# Patient Record
Sex: Female | Born: 1935 | Race: White | Hispanic: No | Smoking: Former smoker
Health system: Southern US, Community
[De-identification: ages and names within clinical notes are randomized; demographics above are authoritative.]

## PROBLEM LIST (undated history)

## (undated) DIAGNOSIS — K259 Gastric ulcer, unspecified as acute or chronic, without hemorrhage or perforation: Secondary | ICD-10-CM

## (undated) DIAGNOSIS — K429 Umbilical hernia without obstruction or gangrene: Secondary | ICD-10-CM

## (undated) DIAGNOSIS — K648 Other hemorrhoids: Secondary | ICD-10-CM

## (undated) DIAGNOSIS — M419 Scoliosis, unspecified: Secondary | ICD-10-CM

## (undated) DIAGNOSIS — G459 Transient cerebral ischemic attack, unspecified: Secondary | ICD-10-CM

## (undated) DIAGNOSIS — I441 Atrioventricular block, second degree: Secondary | ICD-10-CM

## (undated) DIAGNOSIS — C801 Malignant (primary) neoplasm, unspecified: Secondary | ICD-10-CM

## (undated) DIAGNOSIS — E7800 Pure hypercholesterolemia, unspecified: Secondary | ICD-10-CM

## (undated) DIAGNOSIS — R32 Unspecified urinary incontinence: Secondary | ICD-10-CM

## (undated) DIAGNOSIS — I340 Nonrheumatic mitral (valve) insufficiency: Secondary | ICD-10-CM

## (undated) DIAGNOSIS — F411 Generalized anxiety disorder: Secondary | ICD-10-CM

## (undated) DIAGNOSIS — S86812A Strain of other muscle(s) and tendon(s) at lower leg level, left leg, initial encounter: Secondary | ICD-10-CM

## (undated) DIAGNOSIS — K222 Esophageal obstruction: Secondary | ICD-10-CM

## (undated) DIAGNOSIS — I5189 Other ill-defined heart diseases: Secondary | ICD-10-CM

## (undated) DIAGNOSIS — K4090 Unilateral inguinal hernia, without obstruction or gangrene, not specified as recurrent: Secondary | ICD-10-CM

## (undated) DIAGNOSIS — K449 Diaphragmatic hernia without obstruction or gangrene: Secondary | ICD-10-CM

## (undated) DIAGNOSIS — M1990 Unspecified osteoarthritis, unspecified site: Secondary | ICD-10-CM

## (undated) DIAGNOSIS — I10 Essential (primary) hypertension: Secondary | ICD-10-CM

## (undated) DIAGNOSIS — D509 Iron deficiency anemia, unspecified: Secondary | ICD-10-CM

## (undated) DIAGNOSIS — I440 Atrioventricular block, first degree: Secondary | ICD-10-CM

## (undated) DIAGNOSIS — Z86718 Personal history of other venous thrombosis and embolism: Secondary | ICD-10-CM

## (undated) DIAGNOSIS — K5790 Diverticulosis of intestine, part unspecified, without perforation or abscess without bleeding: Secondary | ICD-10-CM

## (undated) DIAGNOSIS — I471 Supraventricular tachycardia, unspecified: Secondary | ICD-10-CM

## (undated) DIAGNOSIS — I7090 Unspecified atherosclerosis: Secondary | ICD-10-CM

## (undated) DIAGNOSIS — I2510 Atherosclerotic heart disease of native coronary artery without angina pectoris: Secondary | ICD-10-CM

## (undated) DIAGNOSIS — E039 Hypothyroidism, unspecified: Secondary | ICD-10-CM

## (undated) DIAGNOSIS — E559 Vitamin D deficiency, unspecified: Secondary | ICD-10-CM

## (undated) HISTORY — DX: Pure hypercholesterolemia, unspecified: E78.00

## (undated) HISTORY — DX: Esophageal obstruction: K22.2

## (undated) HISTORY — DX: Nonrheumatic mitral (valve) insufficiency: I34.0

## (undated) HISTORY — DX: Vitamin D deficiency, unspecified: E55.9

## (undated) HISTORY — DX: Umbilical hernia without obstruction or gangrene: K42.9

## (undated) HISTORY — DX: Other hemorrhoids: K64.8

## (undated) HISTORY — DX: Unspecified osteoarthritis, unspecified site: M19.90

## (undated) HISTORY — DX: Iron deficiency anemia, unspecified: D50.9

## (undated) HISTORY — DX: Unilateral inguinal hernia, without obstruction or gangrene, not specified as recurrent: K40.90

## (undated) HISTORY — DX: Gastric ulcer, unspecified as acute or chronic, without hemorrhage or perforation: K25.9

## (undated) HISTORY — DX: Hypothyroidism, unspecified: E03.9

## (undated) HISTORY — DX: Unspecified urinary incontinence: R32

## (undated) HISTORY — DX: Personal history of other venous thrombosis and embolism: Z86.718

## (undated) HISTORY — DX: Atherosclerotic heart disease of native coronary artery without angina pectoris: I25.10

## (undated) HISTORY — DX: Generalized anxiety disorder: F41.1

## (undated) HISTORY — DX: Scoliosis, unspecified: M41.9

## (undated) HISTORY — DX: Unspecified atherosclerosis: I70.90

## (undated) HISTORY — PX: TONSILLECTOMY: SUR1361

## (undated) HISTORY — DX: Diverticulosis of intestine, part unspecified, without perforation or abscess without bleeding: K57.90

## (undated) HISTORY — PX: LAPAROSCOPIC HYSTERECTOMY: SHX1926

## (undated) HISTORY — DX: Essential (primary) hypertension: I10

## (undated) HISTORY — DX: Diaphragmatic hernia without obstruction or gangrene: K44.9

---

## 2001-07-10 HISTORY — PX: COLONOSCOPY: SHX174

## 2006-07-10 HISTORY — PX: ABDOMINAL HYSTERECTOMY: SHX81

## 2006-10-08 ENCOUNTER — Encounter (INDEPENDENT_AMBULATORY_CARE_PROVIDER_SITE_OTHER): Payer: Self-pay | Admitting: *Deleted

## 2006-10-08 ENCOUNTER — Inpatient Hospital Stay (HOSPITAL_COMMUNITY): Admission: RE | Admit: 2006-10-08 | Discharge: 2006-10-10 | Payer: Self-pay | Admitting: Obstetrics & Gynecology

## 2009-04-09 ENCOUNTER — Ambulatory Visit (HOSPITAL_COMMUNITY): Admission: RE | Admit: 2009-04-09 | Discharge: 2009-04-09 | Payer: Self-pay | Admitting: Internal Medicine

## 2009-07-10 HISTORY — PX: CARDIAC CATHETERIZATION: SHX172

## 2010-03-22 ENCOUNTER — Encounter (INDEPENDENT_AMBULATORY_CARE_PROVIDER_SITE_OTHER): Payer: Self-pay | Admitting: *Deleted

## 2010-05-04 ENCOUNTER — Ambulatory Visit: Payer: Self-pay | Admitting: Cardiovascular Disease

## 2010-08-09 NOTE — Letter (Signed)
Summary: Colonoscopy Date Change Letter  [facility]  [address] [place], [place] [address]   Phone: [phone]  Fax: [phone]      March 22, 2010 MRN: [id number]   Kathleen Paul [address] [place], [place]  [address]   Dear Ms. [facility],   Previously you were recommended to have a repeat colonoscopy around this time. Your chart was recently reviewed by Dr. Russella Dar of [facility]. Follow up colonoscopy is now recommended in November 2014. This revised recommendation is based on current, nationally recognized guidelines for colorectal cancer screening and polyp surveillance. These guidelines are endorsed by the American Cancer Society, [employer] on Colorectal Cancer as well as numerous other major medical organizations.  Please understand that our recommendation assumes that you do not have any new symptoms such as bleeding, a change in bowel habits, anemia, or significant abdominal discomfort. If you do have any concerning GI symptoms or want to discuss the guideline recommendations, please call to arrange an office visit at your earliest convenience. Otherwise we will keep you in our reminder system and contact you 1-2 months prior to the date listed above to schedule your next colonoscopy.  Thank you,   Judie Petit T. Russella Dar, M.D.  [facility] [phone]

## 2010-11-25 NOTE — H&P (Signed)
NAME:  Kathleen Paul, Kathleen Paul NO.:  [id number]   MEDICAL RECORD NO.:  [id number]          PATIENT TYPE:  AMB   LOCATION:  SDC                           [facility]ILITY:  [facility]   PHYSICIAN:  Freddy Finner, M.D.   DATE OF BIRTH:  05-27-1936   DATE OF ADMISSION:  DATE OF DISCHARGE:                              HISTORY & PHYSICAL   ADMISSION DIAGNOSES:  1. Uterine prolapse.  2. Cystocele/rectocele.  3. Stress urinary incontinence.   The patient is a 75 year old white widowed female gravida 5, para 4, who  initially presented to the office in January of 2008 for a GYN exam with  Pap and for a complaint of her bladder falling down. Physical findings  are essentially in the admitting diagnoses. She also had cystometric's  performed in the office on August 29, 2006 with a pessary in place.  That study did reveal stress urinary incontinence and no urethral  pressure, but no other abnormalities.  On her examination the cystocele  prolapse is through the vaginal introitus as does the cervix and there  is a rectocele. Options of therapy have been discussed with the patient  and she was tried on a vaginal pessary which was unsatisfactory for her  and she has requested surgical intervention.  She is admitted at this  time for laparoscopically assisted vaginal hysterectomy, anterior and  posterior colporrhaphy, sacrospinous  ligament suspension of the vagina.   CURRENT REVIEW OF SYSTEMS:  Otherwise is negative. She has no  cardiopulmonary, GI or GU complaints.   PAST MEDICAL HISTORY:  1. The patient is known to have hyperthyroidism.  2. Hypercholesterolemia.  3. Hypertension.  4. Mild osteopenia.   CURRENT MEDICATIONS:  1. Fosamax 70 mg a week.  2. Enalapril 10 mg daily.  3. Levoxyl 0.1 mg daily.  4. Vytorin 10/20 mg daily.  5. Triamterene/hydrochlorothiazide 37.5/25.  6. Calcium 1200 mg daily.   No other medications on a chronic basis except for currently using  estrogen  vaginal cream preoperatively on a nightly basis.   ALLERGIES:  CODEINE.   PAST SURGICAL HISTORY:  She has had four vaginal deliveries. She has a  tubal ligation. She has had vein stripping for venous varicosities of  the lower extremities. She does have a history of having had a  superficial venous phlebitis of the lower extremities.   She had a normal mammogram in June of 2007. She had a scan for partial  bone density that showed a -1 of the right hip.   PHYSICAL EXAMINATION:  HEENT:  Grossly within normal limits.  VITAL SIGNS:  Her most recent blood pressure in the office was 120/80.  NECK: Thyroid gland is not palpably enlarged.  CHEST:  Clear to auscultation throughout.  HEART:  Normal sinus rhythm without murmurs, rubs, or gallops.  ABDOMEN:  Soft and nontender without appreciable organomegaly or  palpable masses.  BREAST EXAM: Normal. No palpable masses, no nipple discharge or skin  change.  PELVIC FINDINGS:  Are discussed in the admitting diagnoses.  BIMANUAL PELVIC EXAM:  Reveals no palpable enlargement of the uterus or  adnexal  masses.  RECTAL:  Normal rectum. Rectovaginal exam confirms the above findings.   Pelvic ultrasound is also considered to be normal with no visible  adnexal masses.   ASSESSMENT:  Symptomatic pelvic relaxation with cervical prolapse,  cystocele prolapse, second degree rectocele.   PLAN:  Laparoscopic assisted vaginal hysterectomy, bilateral salpingo-  oophorectomy, anterior and posterior colporrhaphy. Sacrospinous ligament  suspension of vagina.   Careful consultation with the patient has been carried out, potential  risks to other organs during the procedure, and risk of injury to  bladder or rectum. She will receive antibiotic prophylaxis, sequential  compression hose for antiembolic prophylaxis as well as may need  subcutaneous heparin.      Freddy Finner, M.D.  Electronically Signed     WRN/MEDQ  D:  10/07/2006  T:  10/07/2006   Job:  [id number]

## 2010-11-25 NOTE — Discharge Summary (Signed)
NAME:  Kathleen Paul, Kathleen Paul NO.:  [id number]   MEDICAL RECORD NO.:  [id number]          PATIENT TYPE:  INP   LOCATION:  9319                          [facility]ILITY:  [facility]   PHYSICIAN:  Freddy Finner, M.D.   DATE OF BIRTH:  [birth year]   DATE OF ADMISSION:  10/08/2006  DATE OF DISCHARGE:  10/10/2006                               DISCHARGE SUMMARY   DISCHARGE DIAGNOSES:  1. Uterine prolapse.  2. Third-degree cystocele, second-degree rectocele, vaginal vault      prolapse.  3. 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  4. Benign ovarian inclusion cyst.   OPERATIVE PROCEDURES:  1. Laparoscopically-assisted vaginal hysterectomy.  2. Bilateral salpingo-oophorectomy.  3. Anterior/posterior vaginal repair with sacrospinous ligament      suspension.  4. Suprapubic cystotomy with banana catheter.   INTRAOPERATIVE AND POSTOPERATIVE COMPLICATIONS:  None.   DISPOSITION:  The patient was in satisfactory improved condition on the  second postoperative day by mid day.  At that time, she was voiding  adequate amounts with residuals of 50 mL and the suprapubic catheter was  removed.  She was discharged home on progressively increasing physical  activity, a regular diet.  She was given Percocet 5/325 to be taken as  needed for postoperative pain.  She was given Ceftin 250 mg b.i.d. for 7  days.  There was mild erythema and edema at the suprapubic cath site and  she was experiencing some mild dysuria.   She is to return to the office in approximately 2 weeks.   She is to resume all of her preadmission medications.   She is to take a regular diet.   She is to avoid vaginal entry and to call for fever, heavy bleeding, or  urinary retention.   Details of present illness, past history, family history, review of  systems, and physical exam are recorded in the admission.   Physical findings on admission were primarily remarkable for the pelvic  findings noted as a  postoperative diagnosis.   ADMISSION LABORATORY DATA:  Including CBC, urinalysis, prothrombin time,  and PTT were all normal.  Postoperative hemoglobin was 11.1.   HOSPITAL COURSE:  Following admission, the patient was given 5,000 units  of heparin subcutaneously.  She was placed in compression antiembolic  hose.  She was given an IV bolus of Ancef.  She was taken to the  operating room.  The above-described procedure was accomplished without  intraoperative complications.  Her subcutaneous heparin was continued  every 8 hours for a total of three doses postoperatively.  This was  given for a history of superficial phlebitis in the past and previous  vein-stripping.  Her postoperative course was entirely satisfactory and with no  complications.  By the second postoperative day, her bladder had  returned to adequate function with voiding amounts of at least 300 mL  and residuals of 50 or less.  She was tolerating a regular diet.  She  was ambulating without assistance.  She was having adequate bowel  function.  She had remained afebrile throughout the hospital stay.  She  was discharged home  with disposition as noted above.      Freddy Finner, M.D.  Electronically Signed     WRN/MEDQ  D:  10/11/2006  T:  10/11/2006  Job:  [id number]

## 2010-11-25 NOTE — Op Note (Signed)
NAME:  Kathleen Paul, Kathleen Paul NO.:  [id number]   MEDICAL RECORD NO.:  [id number]          PATIENT TYPE:  INP   LOCATION:  9319                          [facility]   PHYSICIAN:  Freddy Finner, M.D.   DATE OF BIRTH:  July 23, 1935   DATE OF PROCEDURE:  10/08/2006  DATE OF DISCHARGE:                               OPERATIVE REPORT   PREOPERATIVE DIAGNOSES:  1. Third-degree cystocele.  2. Second-degree rectocele.  3. Cervical prolapse.  4. Vaginal vault prolapse or descensus.   OPERATIVE PROCEDURE:  1. Laparoscopic-assisted vaginal hysterectomy.  2. Bilateral salpingo-oophorectomy.  3. Anterior and posterior colporrhaphy.  4. Sacrospinous ligament suspension of the vagina.  5. Suprapubic cystotomy.   ESTIMATED INTRAOPERATIVE BLOOD LOSS:  250 mL.   SURGEON:  Freddy Finner, M.D.   ASSISTANT:  Duke Salvia. Marcelle Overlie, M.D.   INTRAOPERATIVE COMPLICATIONS:  None.   Details of the present illness are recorded in the admission note.  Physical findings are as in the Preoperative Diagnoses.  The patient has  a history of superficial phlebitis and venous varicosities with surgical  stripping, and for that reason was given perioperative heparin 5000  units subcutaneously which will be continued in the postoperative  period.  She was given an IV bolus of Ancef preoperatively.  She was  placed in antiembolic compression hose.   DESCRIPTION OF PROCEDURE:  She was brought to the operating room, placed  under adequate general endotracheal anesthesia, placed in dorsolithotomy  position.  Allen stirrups were used.  Betadine prep was carried out  using Betadine scrub followed by Betadine solution. The bladder was  evacuated with a sterile catheter.  Hulka tenaculum was attached to the  cervix.  Sterile drapes were applied.  Two small incisions were made,  one at the umbilicus, one just above the symphysis.  Through the upper  incision, an 11 mm nonbladed disposable trocar was  introduced.  A 5-6 mm  incision was made just above the symphysis and through it.  A second 5  mm trocar was placed through it.  Ring grasping forceps and Nezhat  irrigation system were used for traction, irrigation, and exposure.  Examination of the upper abdomen revealed no apparent abnormalities.  Also, there were no apparent abnormalities in the pelvis.   The tubes and ovaries were atrophic but appropriate for age.  The uterus  itself was essentially normal.  Using the tripolar device through the  operating channel of the laparoscope, the infundibulopelvic ligaments,  round ligaments, upper broad ligaments were sealed and divided.  This  was performed essentially identically on each side.  Dissection was  carried down to the level just above the arteries.  Attention was then  turned vaginally.   Posterior weighted retractor was placed.  Hulka tenaculum was replaced  with a Jacobs tenaculum.  Deaver retractors were used anterolaterally  for exposure.  Colpotomy incision was made while tenting the mucosa  posterior to the cervix.  The cervix was circumscribed on scalpel to  release the mucosa.  The uterosacral pedicles were taken, sealed, and  divided using the ligature system as  were the bladder pillars.  The  bladder was further advanced off the cervix.  Cardinal ligament pedicles  were taken, sealed, and divided using LigaSure.  Anterior peritoneum was  entered.  Vessel pedicles were taken, sealed, and divided with LigaSure.  This allowed delivery of the uterus through the vaginal introitus.  The  remaining small peritoneal pedicle was sealed and divided using  LigaSure.  Angles of the vagina were then anchored to the uterosacrals  with mattress sutures of 0 Monocryl.  Posterior peritoneum was closed  along with uterosacrals with interrupted 0 Monocryl suture.  The  posterior half of the vaginal incision was closed with interrupted  figure-of-eights of 0 Monocryl.  Edges of the  anterior mucosa were  grasped with Allis'.  The mucosa was tented in the midline.  Progressive  sharp and blunt dissection was used to free the mucosa from the  vesicovaginal fascia.  Interrupted 0 Monocryl sutures were used to  reapproximate the vesicovaginal fascia and to elevate the cystocele and  support the urethral angle.  The urethra was checked for length and  patency using a sterile Kelly.  Segments of mucosa were excised.  The  remaining cuff was closed with figure-of-eights of 0 Monocryl.  The  incision in the midline anteriorly was closed with a running locking 2-0  Monocryl suture.  Attention was turned posteriorly.  The vaginal mucosa  was grasped with the fourchette.  A pyramidal shaped segment of skin was  excised from the perineal body with apex inferiorly.  The mucosa  overlying the rectum was elevated, sharply and bluntly dissected free  and a midline incision made for a distance of approximately 8 cm.  This  was carefully separated from the perirectal structures and tissue.  Using the Capio device with a 0 Dexon suture, the suture was anchored in  the right uterosacral ligament and attached to the vaginal mucosa  posteriorly just below the apex of the vagina.  Plication sutures were  used to recreate the rectovaginal septum and to reapproximate the  levators and the peritoneal body.  The mucosal closure was then started  using a 2-0 Monocryl suture running for a distance of approximately 4-5  cm at which point the sacrospinous ligament suture was tied.  The repair  was completed in a fashion similar to episiotomy repair with running 2-0  Monocryl.  The bladder was filled 300 mL of irrigating solution.  The  suprapubic catheter was placed.  A Bonanno catheter was used, it was  anchored to the skin with 0 nylon sutures.  The bladder was evacuated.  Reinspection was then carried out laparoscopically using the Nezhat irrigation system.  The irrigating solution was aspirated  from the  pelvis.  Inspection of the pedicles revealed complete hemostasis.  Inspection under reduced intra-abdominal pressure confirmed hemostasis.  Gas was allowed to escape from the abdomen.  Irrigation was aspirated  from the pelvis.  Instruments were removed.  Skin incisions were closed  with interrupted subcuticular sutures of 3-0 Dexon.  0.5% Marcaine was  injected into the incision sites for postoperative analgesia.  Steri-  Strips were applied to the lower incision.  The patient was awakened and  taken to the recovery room in good condition.      Freddy Finner, M.D.  Electronically Signed     WRN/MEDQ  D:  10/08/2006  T:  10/09/2006  Job:  [id number]

## 2011-03-24 ENCOUNTER — Encounter: Payer: Self-pay | Admitting: Internal Medicine

## 2011-03-27 ENCOUNTER — Encounter: Payer: Self-pay | Admitting: Internal Medicine

## 2011-03-27 ENCOUNTER — Ambulatory Visit (INDEPENDENT_AMBULATORY_CARE_PROVIDER_SITE_OTHER): Payer: Medicare Other | Admitting: Internal Medicine

## 2011-03-27 DIAGNOSIS — R002 Palpitations: Secondary | ICD-10-CM

## 2011-03-27 DIAGNOSIS — I2510 Atherosclerotic heart disease of native coronary artery without angina pectoris: Secondary | ICD-10-CM

## 2011-03-27 DIAGNOSIS — E785 Hyperlipidemia, unspecified: Secondary | ICD-10-CM

## 2011-03-27 DIAGNOSIS — I10 Essential (primary) hypertension: Secondary | ICD-10-CM

## 2011-03-27 DIAGNOSIS — R079 Chest pain, unspecified: Secondary | ICD-10-CM

## 2011-03-27 MED ORDER — ENALAPRIL-HYDROCHLOROTHIAZIDE 10-25 MG PO TABS: ORAL_TABLET | ORAL | Status: DC

## 2011-03-27 NOTE — Patient Instructions (Addendum)
Your physician recommends that you schedule a follow-up appointment in: AS NEEDED  Your physician has recommended you make the following change in your medication: DECREASE  ENALAPRIL HCT  TO 1/2 TAB EVERY DAY   LOPRESSOR 25 MG 1/2 TAB TWICE DAILY  Your physician has requested that you have en exercise stress myoview. For further information please visit [email or web address] Please follow instruction sheet, as given. DX CHEST PAIN

## 2011-03-29 DIAGNOSIS — E785 Hyperlipidemia, unspecified: Secondary | ICD-10-CM | POA: Insufficient documentation

## 2011-03-29 DIAGNOSIS — I10 Essential (primary) hypertension: Secondary | ICD-10-CM

## 2011-03-29 DIAGNOSIS — I2510 Atherosclerotic heart disease of native coronary artery without angina pectoris: Secondary | ICD-10-CM | POA: Insufficient documentation

## 2011-03-29 DIAGNOSIS — R002 Palpitations: Secondary | ICD-10-CM | POA: Insufficient documentation

## 2011-03-29 DIAGNOSIS — R079 Chest pain, unspecified: Secondary | ICD-10-CM | POA: Insufficient documentation

## 2011-03-29 HISTORY — DX: Essential (primary) hypertension: I10

## 2011-03-29 NOTE — Assessment & Plan Note (Addendum)
She is also on Niaspan.   1 gram.  Keep on statin.  WIll need to be followed.

## 2011-03-29 NOTE — Assessment & Plan Note (Signed)
Patient with some symptoms that may reflect angina  However also has distinct symptoms that are more GI in nature.  She does have an appt in GI. I would recomm a stress myoview to evaluate for ischemia. Get records from [facility].

## 2011-03-29 NOTE — Assessment & Plan Note (Signed)
Get records from [facility].

## 2011-03-29 NOTE — Assessment & Plan Note (Signed)
BP is lower at home.  I would cut vasoretic in 1/2.  Add metoprolol to regimen for palpitations.

## 2011-03-29 NOTE — Progress Notes (Signed)
HPI  Patient is a 75 year old who was previously followed by Dr. Welton Flakes in [place].  Reported to have cath several years ago that showed a "50% narrowing" She wants a new heart doctor.  Referred by Dr. Toni Arthurs. DOes complain of chest tightness with exertion.  INtermittent.  Does say that symptoms are relieved with walking..  Some SOB.  Has a irregular heart beat but when on metoprolol in past did not tolerate because of dizziness. Also notes some dysphagial.  This can cause chest pain. These symptoms brought on by food.   Frequency is unchanged.  Does have referral to GI. Note that her BP is lower at home than in the doctors office.  Usually runs 95/ to 110. Allergies  Allergen Reactions  . Codeine     Current Outpatient Prescriptions  Medication Sig Dispense Refill  . aspirin 81 MG tablet Take 81 mg by mouth daily.        . calcium-vitamin D (CALCIUM + D) 250-125 MG-UNIT per tablet Take 1 tablet by mouth daily.        . diclofenac (VOLTAREN) 75 MG EC tablet Take 75 mg by mouth 2 (two) times daily.        . enalapril-hydrochlorothiazide (VASERETIC) 10-25 MG per tablet 1/2 TAB EVERY DAY        . levothyroxine (SYNTHROID, LEVOTHROID) 75 MCG tablet Take 75 mcg by mouth daily.        . simvastatin (ZOCOR) 40 MG tablet Take 40 mg by mouth at bedtime.          Past Medical History  Diagnosis Date  . Chest pain   . Essential hypertension, benign   . Dysphagia, unspecified   . Unspecified hypothyroidism   . Anxiety state, unspecified   . Pure hypercholesterolemia     No past surgical history on file.  No family history on file.  History   Social History  . Marital Status: Single    Spouse Name: N/A    Number of Children: N/A  . Years of Education: N/A   Occupational History  . Not on file.   Social History Main Topics  . Smoking status: Former Smoker    Quit date: 07/10/1988  . Smokeless tobacco: Not on file  . Alcohol Use: Not on file  . Drug Use: Not on file  .  Sexually Active: Not on file   Other Topics Concern  . Not on file   Social History Narrative  . No narrative on file    Review of Systems:  All systems reviewed.  They are negative to the above problem except as previously stated.  Vital Signs: BP 132/74  Pulse 59  Ht 5\' 2"  (1.575 m)  Wt 155 lb (70.308 kg)  BMI 28.35 kg/m2  Physical Exam  Patient is in NAD. HEENT:  Normocephalic, atraumatic. EOMI, PERRLA.  Neck: JVP is normal. No thyromegaly. No bruits.  Lungs: clear to auscultation. No rales no wheezes.  Heart: Regular rate and rhythm. Normal S1, S2. No S3.   No significant murmurs. PMI not displaced.  Abdomen:  Supple, nontender. Normal bowel sounds. No masses. No hepatomegaly.  Extremities:   Good distal pulses throughout. No lower extremity edema.  Musculoskeletal :moving all extremities.  Neuro:   alert and oriented x3.  CN II-XII grossly intact.  EKG:  (03/22/11):  Sinus bradycardia.  53 bpm.   Assessment and Plan:

## 2011-03-29 NOTE — Assessment & Plan Note (Signed)
Patient describes what sound like isolated PACs or PVCs.  No sustained rhythm problem. I would recomm cutting back on vasoretic and adding metoprolol.  Follow.

## 2011-04-10 ENCOUNTER — Encounter: Payer: Self-pay | Admitting: Internal Medicine

## 2011-04-10 ENCOUNTER — Ambulatory Visit (HOSPITAL_COMMUNITY): Payer: Medicare Other | Attending: Internal Medicine | Admitting: Radiology

## 2011-04-10 DIAGNOSIS — I2510 Atherosclerotic heart disease of native coronary artery without angina pectoris: Secondary | ICD-10-CM

## 2011-04-10 DIAGNOSIS — R0602 Shortness of breath: Secondary | ICD-10-CM

## 2011-04-10 DIAGNOSIS — R0789 Other chest pain: Secondary | ICD-10-CM

## 2011-04-10 DIAGNOSIS — R079 Chest pain, unspecified: Secondary | ICD-10-CM | POA: Insufficient documentation

## 2011-04-10 MED ORDER — TECHNETIUM TC 99M TETROFOSMIN IV KIT
33.0000 | PACK | Freq: Once | INTRAVENOUS | Status: AC | PRN
  Administered 2011-04-10: 33 via INTRAVENOUS

## 2011-04-10 MED ORDER — TECHNETIUM TC 99M TETROFOSMIN IV KIT
11.0000 | PACK | Freq: Once | INTRAVENOUS | Status: AC | PRN
  Administered 2011-04-10: 11 via INTRAVENOUS

## 2011-04-10 NOTE — Progress Notes (Addendum)
[facility] SITE 3 NUCLEAR MED [address] [place] [address] [phone]  Cardiology Nuclear Med Study  Kathleen Paul is a 75 y.o. female [id number] 04-Oct-1935   Nuclear Med Background Indication for Stress Test:  Evaluation for Ischemia History:  10/11 MPS@ ION:[id number]:"01% narrowing" per dictation Cardiac Risk Factors: History of Smoking, Hypertension and Lipids  Symptoms:  Chest Pain/Tightness (last episode of chest discomfort was about 6-weeks ago), Palpitations and SOB   Nuclear Pre-Procedure Caffeine/Decaff Intake:  None NPO After: 9:00pm   Lungs:  Clear. IV 0.9% NS with Angio Cath:  22g  IV Site: R Hand  IV Started by:  Cathlyn Parsons, RN  Chest Size (in):  40 Cup Size: C  Height: 5\' 2"  (1.575 m)  Weight:  157 lb (71.215 kg)  BMI:  Body mass index is 28.72 kg/(m^2). Tech Comments:  Metoprolol held x 24 hours    Nuclear Med Study 1 or 2 day study: 1 day  Stress Test Type:  Stress  Reading MD: Cassell Clement, MD  Order Authorizing Provider:  Dietrich Pates, MD  Resting Radionuclide: Technetium 92m Tetrofosmin  Resting Radionuclide Dose: 11.0 mCi   Stress Radionuclide:  Technetium 53m Tetrofosmin  Stress Radionuclide Dose: 33.0 mCi           Stress Protocol Rest HR: 53 Stress HR: 146  Rest BP: 113/72 Stress BP: 190  Exercise Time (min): 6:05 METS: 7.0   Predicted Max HR: 147 bpm % Max HR: 99.32 bpm Rate Pressure Product: 02725   Dose of Adenosine (mg):  n/a Dose of Lexiscan: n/a mg  Dose of Atropine (mg): n/a Dose of Dobutamine: n/a mcg/kg/min (at max HR)  Stress Test Technologist: Smiley Houseman, CMA-N  Nuclear Technologist:  Domenic Polite, CNMT     Rest Procedure:  Myocardial perfusion imaging was performed at rest 45 minutes following the intravenous administration of Technetium 65m Tetrofosmin.  Rest ECG: No acute changes.  Stress Procedure:  The patient exercised for 6:05 on the treadmill utilizing the Bruce  protocol.  The patient stopped due to fatigue and denied any chest pain.  There were nonspecific ST-T wave changes.  There were occasional PVC's/couplets, rare PAC's and one episode 4-beat v-tach.  Technetium 35m Tetrofosmin was injected at peak exercise and myocardial perfusion imaging was performed after a brief delay.  Stress ECG: ST depression 1.5 mm horizontal in V5  QPS Raw Data Images:  Normal; no motion artifact; normal heart/lung ratio. Stress Images:  There is decreased uptake in the apex. Rest Images:  Normal homogeneous uptake in all areas of the myocardium. Subtraction (SDS):  These findings are consistent with possible apical ischemia. Transient Ischemic Dilatation (Normal <1.22):  1.01 Lung/Heart Ratio (Normal <0.45):  0.37  Quantitative Gated Spect Images QGS EDV:  79 ml QGS ESV:  24 ml QGS cine images:  NL LV Function; NL Wall Motion QGS EF: 70%  Impression Exercise Capacity:  Fair exercise capacity. BP Response:  Normal blood pressure response. Clinical Symptoms:  No chest pain. ECG Impression: 1.5 mm ST depression in V5 at peak exercise.  4 beat run VT with exercise Comparison with Prior Nuclear Study: No images to compare  Overall Impression:  Abnormal stress nuclear study.  With stress the patient has a small perfusion defect at apex.  This could be just apical thinning but it is not seen on rest study. LV function is normal.   Cassell Clement     Low risk scan.  Blood flow overall looks  good.  Small defect at tip of heart, not convinced it is significant. Keep on same regimen.  Stay active.

## 2011-04-11 ENCOUNTER — Encounter: Payer: Self-pay | Admitting: Gastroenterology

## 2011-04-11 ENCOUNTER — Ambulatory Visit (INDEPENDENT_AMBULATORY_CARE_PROVIDER_SITE_OTHER): Payer: Medicare Other | Admitting: Gastroenterology

## 2011-04-11 ENCOUNTER — Other Ambulatory Visit (HOSPITAL_COMMUNITY): Payer: Self-pay | Admitting: Radiology

## 2011-04-11 DIAGNOSIS — R1310 Dysphagia, unspecified: Secondary | ICD-10-CM

## 2011-04-11 DIAGNOSIS — K222 Esophageal obstruction: Secondary | ICD-10-CM

## 2011-04-11 NOTE — Assessment & Plan Note (Signed)
Symptoms are very likely due to a esophageal stricture, peptic much more likely than malignant.  Recommendations #1 upper endoscopy with dilatation as indicated  Risks, alternatives, and complications of the procedure, including bleeding, perforation, and possible need for surgery, were explained to the patient.  Patient's questions were answered.

## 2011-04-11 NOTE — Patient Instructions (Signed)
Your Endoscopy is scheduled on 04/12/2011 at 2:30pm  Upper GI Endoscopy Upper GI endoscopy means using a flexible scope to look at the esophagus, stomach and upper small bowel. This is done to make a diagnosis in people with heartburn, abdominal pain, or abnormal bleeding. Sometimes an endoscope is needed to remove foreign bodies or food that become stuck in the esophagus; it can also be used to take biopsy samples. For the best results, do not eat or drink for 8 hours before having your upper endoscopy.  To perform the endoscopy, you will probably be sedated and your throat will be numbed with a special spray. The endoscope is then slowly passed down your throat (this will not interfere with your breathing). An endoscopy exam takes 15-30 minutes to complete and there is no real pain. Patients rarely remember much about the procedure. The results of the test may take several days if a biopsy or other test is taken.  You may have a sore throat after an endoscopy exam. Serious complications are very rare. Stick to liquids and soft foods until your pain is better. You should not drive a car or operate any dangerous equipment for at least 24 hours after being sedated. SEEK IMMEDIATE MEDICAL CARE IF:  You have severe throat pain.   You have shortness of breath.   You have bleeding problems.   You have a fever.   You have difficulty recovering from your sedation.  Document Released: 08/03/2004 Document Re-Released: 09/20/2009 [facility] Patient Information 2011 [place], [place].

## 2011-04-11 NOTE — Progress Notes (Signed)
History of Present Illness:  Kathleen Paul is a 75 year old white female referred at the request of Dr. Toni Arthurs for evaluation of dysphagia. On several occasions she's had solid food impactions. She denies odynophagia. She has rarely had mild dysphagia at the onset of swallowing. Weight has been stable. She denies pyrosis.    Review of Systems: Pertinent positive and negative review of systems were noted in the above HPI section. All other review of systems were otherwise negative.    Current Medications, Allergies, Past Medical History, Past Surgical History, Family History and Social History were reviewed in [facility] electronic medical record  Vital signs were reviewed in today's medical record. Physical Exam: General: Well developed , well nourished, no acute distress Head: Normocephalic and atraumatic Eyes:  sclerae anicteric, EOMI Ears: Normal auditory acuity Mouth: No deformity or lesions Lungs: Clear throughout to auscultation Heart: Regular rate and rhythm; no murmurs, rubs or bruits Abdomen: Soft, non tender and non distended. No masses, hepatosplenomegaly or hernias noted. Normal Bowel sounds Rectal:deferred Musculoskeletal: Symmetrical with no gross deformities  Pulses:  Normal pulses noted Extremities: No clubbing, cyanosis, edema or deformities noted Neurological: Alert oriented x 4, grossly nonfocal Psychological:  Alert and cooperative. Normal mood and affect

## 2011-04-12 ENCOUNTER — Encounter: Payer: Self-pay | Admitting: Internal Medicine

## 2011-04-12 ENCOUNTER — Ambulatory Visit (AMBULATORY_SURGERY_CENTER): Payer: Medicare Other | Admitting: Gastroenterology

## 2011-04-12 ENCOUNTER — Encounter: Payer: Self-pay | Admitting: Gastroenterology

## 2011-04-12 ENCOUNTER — Telehealth: Payer: Self-pay | Admitting: Internal Medicine

## 2011-04-12 DIAGNOSIS — K222 Esophageal obstruction: Secondary | ICD-10-CM

## 2011-04-12 DIAGNOSIS — R1310 Dysphagia, unspecified: Secondary | ICD-10-CM

## 2011-04-12 MED ORDER — SODIUM CHLORIDE 0.9 % IV SOLN: 500.0000 mL | INTRAVENOUS | Status: DC

## 2011-04-12 NOTE — Telephone Encounter (Signed)
Test results

## 2011-04-12 NOTE — Patient Instructions (Addendum)
Esophageal Stricture (Narrowing) The esophagus is the long, narrow tube which carries food and liquid from the mouth to the stomach. Sometimes a part of the esophagus becomes narrow and makes it difficult, painful, or even impossible to swallow. This is called an esophageal stricture.  SYMPTOMS Some of the problems are difficulty swallowing or pain with swallowing. CAUSES Common causes of blockage or strictures of the esophagus are:  Exposure of the lower esophagus to the acid from the stomach may cause narrowing.   Hiatal hernia in which a small part of the stomach bulges up through the diaphragm can cause a narrowing in the bottom of the esophagus.   Scleroderma is a tissue disorder that affects the esophagus and makes swallowing difficult.   Achalasia is an absence of nerves in the lower esophagus and to the esophageal sphincter. This absence of nerves may be congenital (present since birth). This can cause irregular spasms which do not allow food and fluid through.   Strictures may develop from swallowing materials which damage the esophagus. Examples are acids or alkalis such as lye.   Schatzki's Ring is a narrow ring of non-cancerous tissue which narrows the lower esophagus. The cause of this is unknown.   Growths can block the esophagus.  DIAGNOSIS Your caregiver often suspects this problem by taking a medical history. They will also do a physical exam. They may then take X-rays and/or perform an endoscopy. Endoscopy is an exam in which a tube like a small flexible telescope is used to look at your esophagus.  TREATMENT AND PROCEDURE  One form of treatment is to dilate the narrow area. This means to stretch it.   When this is not successful, chest surgery may be required. This is a much more extensive form of treatment with a longer recovery time.  Both of the above treatments make the passage of food and water into the stomach easier. They also make it easier for stomach contents  to bubble back into the esophagus. Special medications may be used following the procedure to help prevent further narrowing. Medications may be used to lower the amount of acid in the stomach juice.  SEEK IMMEDIATE MEDICAL CARE IF:  Your swallowing is becoming more painful, difficult, or you are unable to swallow.   You vomit up blood.   You develop black tarry stools.   You develop chills or an unexplained fever of over 101 F (38.3 C).   You develop chest or abdominal pain.   You develop shortness of breath, feel lightheaded, or faint.  Follow up with medical care as your caregiver suggests. Document Released: 03/06/2006 Document Re-Released: 04/23/2007 [facility] Patient Information 2011 [place], [place].  Follow your discharge instructions.  Follow the Dilatation Diet:  -Nothing to eat or drink until 330 pm.    From 330 until 4:30 only CLEAR liquids.    4:30 p.m. Until morning SOFT foods only. Resume your regular diet in AM.

## 2011-04-12 NOTE — Progress Notes (Signed)
Discussed with pt

## 2011-04-12 NOTE — Progress Notes (Signed)
LMTCB ./CY 

## 2011-04-12 NOTE — Telephone Encounter (Signed)
Pt notified of myoview done 04/10/11.

## 2011-04-13 ENCOUNTER — Encounter: Payer: Self-pay | Admitting: Gastroenterology

## 2011-04-13 ENCOUNTER — Telehealth: Payer: Self-pay | Admitting: *Deleted

## 2011-04-13 NOTE — Telephone Encounter (Signed)

## 2012-08-09 ENCOUNTER — Encounter: Payer: Self-pay | Admitting: Family Medicine

## 2012-08-09 ENCOUNTER — Ambulatory Visit (INDEPENDENT_AMBULATORY_CARE_PROVIDER_SITE_OTHER): Payer: Medicare Other | Admitting: Family Medicine

## 2012-08-09 VITALS — BP 160/82 | HR 60 | Temp 97.3°F | Ht 60.75 in | Wt 159.0 lb

## 2012-08-09 DIAGNOSIS — I2510 Atherosclerotic heart disease of native coronary artery without angina pectoris: Secondary | ICD-10-CM

## 2012-08-09 DIAGNOSIS — E039 Hypothyroidism, unspecified: Secondary | ICD-10-CM

## 2012-08-09 DIAGNOSIS — R32 Unspecified urinary incontinence: Secondary | ICD-10-CM

## 2012-08-09 DIAGNOSIS — I10 Essential (primary) hypertension: Secondary | ICD-10-CM

## 2012-08-09 DIAGNOSIS — E785 Hyperlipidemia, unspecified: Secondary | ICD-10-CM

## 2012-08-09 DIAGNOSIS — R300 Dysuria: Secondary | ICD-10-CM

## 2012-08-09 DIAGNOSIS — K222 Esophageal obstruction: Secondary | ICD-10-CM

## 2012-08-09 LAB — POCT URINALYSIS DIPSTICK
Bilirubin, UA: NEGATIVE
Glucose, UA: NEGATIVE
Ketones, UA: NEGATIVE
Leukocytes, UA: NEGATIVE
Nitrite, UA: NEGATIVE
Urobilinogen, UA: NEGATIVE
pH, UA: 7.5

## 2012-08-09 MED ORDER — MIRABEGRON ER 25 MG PO TB24: 25.0000 mg | ORAL_TABLET | Freq: Every day | ORAL | Status: DC

## 2012-08-09 MED ORDER — LEVOTHYROXINE SODIUM 75 MCG PO TABS: 75.0000 ug | ORAL_TABLET | Freq: Every day | ORAL | Status: DC

## 2012-08-09 MED ORDER — SIMVASTATIN 40 MG PO TABS: 40.0000 mg | ORAL_TABLET | Freq: Every day | ORAL | Status: DC

## 2012-08-09 MED ORDER — DICLOFENAC SODIUM 75 MG PO TBEC: 75.0000 mg | DELAYED_RELEASE_TABLET | Freq: Two times a day (BID) | ORAL | Status: DC

## 2012-08-09 NOTE — Progress Notes (Signed)
Subjective:    Patient ID: Kathleen Paul, female    DOB: 1936/05/02, 77 y.o.   MRN: [id number]  HPI  77 yo female with h/o CAD, HLD, hypothyroidism, and HTN here to establish care.  She had been seeing Tomi Bamberger.  CAD- followed by Dietrich Pates.  Last note reviewed from 03/2011. Strong FH of CAD.  On Metoprolol, ASA daily.  Dr. Tenny Craw also felt some of her symptoms were related to her dyspepsia/esophageal stricture.  Esophageal stricture- sees Dr. Russella Dar. Reviewed endo from 04/2011- stricture visualized and was dilated.  Hypothyroidism- on synthroid 75 mcg daily.  Brings in records from [facility] office. Last TSH was 3.110 in 12/2011.  HTN- takes enalapril- HCTZ 10-25 mg- 1/2 tablet daily.  Per pt, very well controlled.  H/o white coat HTN- BP at home 90s-100s/70s. Cr 1.03 in 12/2011.  HLD- takes Zocor 40 mg daily and niaspan 1000 mg daily. LDL 60, HDL 55, TG 56 in 12/2011.  H/o Vit D deficiency- Vit D 43.3 in 12/2011.  Stress incontinence- on Toviaz.  Has noticed past several months, increased urinary frequency.  Has severe dry mouth.  This was prescribed by her GYN, Dr. Jennette Kettle.  Patient Active Problem List  Diagnosis  . Chest pain  . Dyslipidemia  . Hypertension  . CAD (coronary artery disease)  . Palpitations  . Stricture and stenosis of esophagus  . Essential hypertension, benign  . Unspecified hypothyroidism   Past Medical History  Diagnosis Date  . Chest pain   . Dysphagia, unspecified   . Anxiety state, unspecified   . Pure hypercholesterolemia   . Essential hypertension, benign   . Unspecified hypothyroidism    Past Surgical History  Procedure Date  . Laparoscopic hysterectomy    History  Substance Use Topics  . Smoking status: Former Smoker    Quit date: 07/10/1988  . Smokeless tobacco: Not on file  . Alcohol Use: No   Family History  Problem Relation Age of Onset  . Breast cancer Sister   . Breast cancer Maternal Aunt   . Heart disease Mother   .  Heart disease Father   . Heart disease Brother     sister   Allergies  Allergen Reactions  . Codeine    Current Outpatient Prescriptions on File Prior to Visit  Medication Sig Dispense Refill  . aspirin 81 MG tablet Take 81 mg by mouth daily.        . calcium-vitamin D (CALCIUM + D) 250-125 MG-UNIT per tablet Take 1 tablet by mouth daily.        . diclofenac (VOLTAREN) 75 MG EC tablet Take 75 mg by mouth 2 (two) times daily.        . enalapril-hydrochlorothiazide (VASERETIC) 10-25 MG per tablet 1/2 TAB EVERY DAY        . levothyroxine (SYNTHROID, LEVOTHROID) 75 MCG tablet Take 75 mcg by mouth daily.        . metoprolol tartrate (LOPRESSOR) 25 MG tablet Take 25 mg by mouth. 1/2 tab morning and evening       . NIASPAN 1000 MG CR tablet       . simvastatin (ZOCOR) 40 MG tablet Take 40 mg by mouth at bedtime.         The PMH, PSH, Social History, Family History, Medications, and allergies have been reviewed in [facility], and have been updated if relevant.  Review of Systems See HPI No CP No SOB No n/v/d   Denies symptoms of hypo or  hyperthyroidism Objective:   Physical Exam BP 160/82  Pulse 60  Temp 97.3 F (36.3 C)  Ht 5' 0.75" (1.543 m)  Wt 159 lb (72.122 kg)  BMI 30.29 kg/m2  General:  Well-developed,well-nourished,in no acute distress; alert,appropriate and cooperative throughout examination Head:  normocephalic and atraumatic.   Eyes:  vision grossly intact, pupils equal, pupils round, and pupils reactive to light.   Ears:  R ear normal and L ear normal.   Nose:  no external deformity.   Mouth:  good dentition.   Neck:  No deformities, masses, or tenderness noted. Lungs:  Normal respiratory effort, chest expands symmetrically. Lungs are clear to auscultation, no crackles or wheezes. Heart:  Normal rate and regular rhythm. S1 and S2 normal without gallop, murmur, click, rub or other extra sounds. Abdomen:  Bowel sounds positive,abdomen soft and non-tender without masses,  organomegaly or hernias noted. Msk:  No deformity or scoliosis noted of thoracic or lumbar spine.   Extremities:  No clubbing, cyanosis, edema, or deformity noted with normal full range of motion of all joints.   Neurologic:  alert & oriented X3 and gait normal.   Skin:  Intact without suspicious lesions or rashes Cervical Nodes:  No lymphadenopathy noted Axillary Nodes:  No palpable lymphadenopathy Psych:  Cognition and judgment appear intact. Alert and cooperative with normal attention span and concentration. No apparent delusions, illusions, hallucinations    Assessment & Plan:   1. Dyslipidemia  Stable on current meds. Recheck labs today. Lipid Panel  2. Hypertension  Stable on current meds- has white coat HTN. Comprehensive metabolic panel  3. CAD (coronary artery disease)  On ASA. Followed by cards.   4. Unspecified hypothyroidism  Continue current dose of synthroid. Recheck labs today. TSH, T4, Free  5. UI (urinary incontinence)  Deteriorated and with dry mouth. UA neg for infection today.  Will d/c toviaz and start myrbetriq to hopefully improve symptoms of UI and dry mouth.  Follow up in 1 month. The patient indicates understanding of these issues and agrees with the plan.    6. Stricture and stenosis of esophagus  Improved s/p dilatation.

## 2012-08-09 NOTE — Patient Instructions (Addendum)
It was so nice to meet you. We will call you with your lab results. Check with your insurance to see if they will cover the shingles shot- ask them if it is cheaper for you to have in office or pharmacy.  We are starting Myretriq.  Please do not take this WITH Toviaz.

## 2012-08-10 LAB — COMPREHENSIVE METABOLIC PANEL
ALT: 12 U/L (ref 0–35)
AST: 20 U/L (ref 0–37)
CO2: 25 mEq/L (ref 19–32)
Calcium: 9.4 mg/dL (ref 8.4–10.5)
Chloride: 100 mEq/L (ref 96–112)
Creat: 1.03 mg/dL (ref 0.50–1.10)
Sodium: 138 mEq/L (ref 135–145)
Total Protein: 6.9 g/dL (ref 6.0–8.3)

## 2012-08-10 LAB — TSH: TSH: 1.818 u[IU]/mL (ref 0.350–4.500)

## 2012-08-10 LAB — LIPID PANEL
LDL Cholesterol: 67 mg/dL (ref 0–99)
Triglycerides: 68 mg/dL (ref <150)
VLDL: 14 mg/dL (ref 0–40)

## 2012-08-12 ENCOUNTER — Encounter: Payer: Self-pay | Admitting: *Deleted

## 2012-08-20 ENCOUNTER — Encounter: Payer: Self-pay | Admitting: Family Medicine

## 2012-08-20 ENCOUNTER — Other Ambulatory Visit: Payer: Self-pay | Admitting: Family Medicine

## 2012-08-20 ENCOUNTER — Ambulatory Visit (INDEPENDENT_AMBULATORY_CARE_PROVIDER_SITE_OTHER): Payer: Medicare Other | Admitting: Family Medicine

## 2012-08-20 ENCOUNTER — Telehealth: Payer: Self-pay

## 2012-08-20 ENCOUNTER — Ambulatory Visit (INDEPENDENT_AMBULATORY_CARE_PROVIDER_SITE_OTHER)
Admission: RE | Admit: 2012-08-20 | Discharge: 2012-08-20 | Disposition: A | Discharge status: Home / Self Care | Payer: Medicare Other | Source: Ambulatory Visit | Attending: Family Medicine | Admitting: Family Medicine

## 2012-08-20 VITALS — BP 140/70 | HR 56 | Temp 97.5°F | Wt 162.0 lb

## 2012-08-20 DIAGNOSIS — R1909 Other intra-abdominal and pelvic swelling, mass and lump: Secondary | ICD-10-CM

## 2012-08-20 DIAGNOSIS — K4090 Unilateral inguinal hernia, without obstruction or gangrene, not specified as recurrent: Secondary | ICD-10-CM

## 2012-08-20 DIAGNOSIS — K828 Other specified diseases of gallbladder: Secondary | ICD-10-CM

## 2012-08-20 HISTORY — DX: Other intra-abdominal and pelvic swelling, mass and lump: R19.09

## 2012-08-20 MED ORDER — IOHEXOL 300 MG/ML  SOLN
100.0000 mL | Freq: Once | INTRAMUSCULAR | Status: AC | PRN
  Administered 2012-08-20: 100 mL via INTRAVENOUS

## 2012-08-20 NOTE — Progress Notes (Signed)
Subjective:    Patient ID: Kathleen Paul, female    DOB: 12/24/1935, 77 y.o.   MRN: [id number]  HPI  77 yo female with h/o CAD, HLD, hypothyroidism, HTN, GERD, esophageal stricture s/p dilation here for "stomach issues."  Yesterday, acute onset of right low suprapubic buldge.  It was not tender but stomach felt "sour."  Symptoms improved with tagamet but buldge is still there.  It is non tender and she feels it is smaller.  No fever. No n/v/d.  Never had anything like this before.  Remote h/o hysterectomy.  Patient Active Problem List  Diagnosis  . Chest pain  . Dyslipidemia  . Hypertension  . CAD (coronary artery disease)  . Palpitations  . Stricture and stenosis of esophagus  . Essential hypertension, benign  . Unspecified hypothyroidism  . UI (urinary incontinence)  . Dysuria   Past Medical History  Diagnosis Date  . Chest pain   . Dysphagia, unspecified   . Anxiety state, unspecified   . Pure hypercholesterolemia   . Essential hypertension, benign   . Unspecified hypothyroidism   . Arthritis   . Vitamin D deficiency   . UI (urinary incontinence)    Past Surgical History  Procedure Laterality Date  . Laparoscopic hysterectomy    . Abdominal hysterectomy    . Tonsillectomy     History  Substance Use Topics  . Smoking status: Former Smoker    Quit date: 07/10/1988  . Smokeless tobacco: Not on file  . Alcohol Use: No   Family History  Problem Relation Age of Onset  . Breast cancer Sister   . Breast cancer Maternal Aunt   . Heart disease Mother   . Heart disease Father   . Heart disease Brother     sister   Allergies  Allergen Reactions  . Codeine    Current Outpatient Prescriptions on File Prior to Visit  Medication Sig Dispense Refill  . aspirin 81 MG tablet Take 81 mg by mouth daily.        . calcium-vitamin D (CALCIUM + D) 250-125 MG-UNIT per tablet Take 1 tablet by mouth daily.        . diclofenac (VOLTAREN) 75 MG EC tablet Take 1 tablet  (75 mg total) by mouth 2 (two) times daily.  120 tablet  3  . enalapril-hydrochlorothiazide (VASERETIC) 10-25 MG per tablet 1/2 TAB EVERY DAY        . levothyroxine (SYNTHROID, LEVOTHROID) 75 MCG tablet Take 1 tablet (75 mcg total) by mouth daily.  90 tablet  3  . metoprolol tartrate (LOPRESSOR) 25 MG tablet Take 25 mg by mouth. 1/2 tab morning and evening       . mirabegron ER (MYRBETRIQ) 25 MG TB24 Take 1 tablet (25 mg total) by mouth daily.  30 tablet  0  . NIASPAN 1000 MG CR tablet       . simvastatin (ZOCOR) 40 MG tablet Take 1 tablet (40 mg total) by mouth at bedtime.  90 tablet  3   No current facility-administered medications on file prior to visit.   The PMH, PSH, Social History, Family History, Medications, and allergies have been reviewed in [facility], and have been updated if relevant.  Review of Systems See HPI  +fatigue Objective:   Physical Exam BP 140/70  Pulse 56  Temp(Src) 97.5 F (36.4 C)  Wt 162 lb (73.483 kg)  BMI 30.86 kg/m2  General:  Well-developed,well-nourished,in no acute distress; alert,appropriate and cooperative throughout examination Head:  normocephalic and  atraumatic.   Eyes:  vision grossly intact, pupils equal, pupils round, and pupils reactive to light.   Ears:  R ear normal and L ear normal.   Nose:  no external deformity.   Mouth:  good dentition.   Neck:  No deformities, masses, or tenderness noted. Lungs:  Normal respiratory effort, chest expands symmetrically. Lungs are clear to auscultation, no crackles or wheezes. Heart:  Normal rate and regular rhythm. S1 and S2 normal without gallop, murmur, click, rub or other extra sounds. Abdomen: +right sided pubic mass, subcutaneous, non reducible Msk:  No deformity or scoliosis noted of thoracic or lumbar spine.   Extremities:  No clubbing, cyanosis, edema, or deformity noted with normal full range of motion of all joints.   Neurologic:  alert & oriented X3 and gait normal.   Skin:  Intact without  suspicious lesions or rashes Cervical Nodes:  No lymphadenopathy noted Axillary Nodes:  No palpable lymphadenopathy Psych:  Cognition and judgment appear intact. Alert and cooperative with normal attention span and concentration. No apparent delusions, illusions, hallucinations    Assessment & Plan:   1. Suprapubic mass New- unclear etiology.  ? Hernia although not typical in location or structure. Will get CT of pelvis/abdomen for further evaluation. The patient indicates understanding of these issues and agrees with the plan.  - CT Abdomen Pelvis W Contrast; Future

## 2012-08-20 NOTE — Telephone Encounter (Signed)
Kathleen Paul with [facility] called report for abdomen and pelvis; Kathleen Paul had left message for Dr Elmer Sow cell but wanted physician to see report. Dr Dayton Martes has already commented and spoke with pt according to result note with CT abdomen and pelvis. Kathleen Paul voiced understanding.

## 2012-08-20 NOTE — Patient Instructions (Addendum)
Good to see you. Let's get a Ct scan.  Please stop by to see Shirlee Limerick on your way out- she will set this up for you. I will call you with results.

## 2012-09-03 ENCOUNTER — Ambulatory Visit: Payer: Self-pay | Admitting: General Surgery

## 2012-09-10 ENCOUNTER — Telehealth: Payer: Self-pay | Admitting: *Deleted

## 2012-09-10 NOTE — Telephone Encounter (Signed)
Prior Berkley Harvey is needed for levothyroxine.  Pt has been on this for over 15 years but her copay has gone up from $6 to $12.  She says she had been on synthroid years ago, before starting levothyroxine.  Form is on your desk.

## 2012-09-11 NOTE — Telephone Encounter (Signed)
Form signed and on my desk. 

## 2012-09-12 NOTE — Telephone Encounter (Signed)
Form faxed

## 2012-09-16 ENCOUNTER — Other Ambulatory Visit: Payer: Self-pay | Admitting: *Deleted

## 2012-09-16 MED ORDER — MIRABEGRON ER 25 MG PO TB24: 25.0000 mg | ORAL_TABLET | Freq: Every day | ORAL | Status: DC

## 2012-09-17 ENCOUNTER — Ambulatory Visit: Payer: Self-pay | Admitting: General Surgery

## 2012-09-17 DIAGNOSIS — I499 Cardiac arrhythmia, unspecified: Secondary | ICD-10-CM

## 2012-09-17 LAB — CBC WITH DIFFERENTIAL/PLATELET
Basophil %: 1 %
HCT: 33.9 % — ABNORMAL LOW (ref 35.0–47.0)
Lymphocyte %: 20 %
Monocyte #: 0.6 x10 3/mm (ref 0.2–0.9)
Neutrophil #: 4 10*3/uL (ref 1.4–6.5)
RBC: 3.52 10*6/uL — ABNORMAL LOW (ref 3.80–5.20)
RDW: 12.6 % (ref 11.5–14.5)
WBC: 5.8 10*3/uL (ref 3.6–11.0)

## 2012-09-18 NOTE — Telephone Encounter (Signed)
Prior auth for levothyroxine denied by [employer].  Pt understands that she will have to pay the new higher copay.

## 2012-09-23 ENCOUNTER — Ambulatory Visit: Payer: Self-pay | Admitting: General Surgery

## 2012-09-23 DIAGNOSIS — K4090 Unilateral inguinal hernia, without obstruction or gangrene, not specified as recurrent: Secondary | ICD-10-CM

## 2012-09-23 HISTORY — PX: HERNIA REPAIR: SHX51

## 2012-10-03 ENCOUNTER — Encounter: Payer: Self-pay | Admitting: *Deleted

## 2012-10-08 ENCOUNTER — Ambulatory Visit (INDEPENDENT_AMBULATORY_CARE_PROVIDER_SITE_OTHER): Payer: Medicare Other | Admitting: General Surgery

## 2012-10-08 ENCOUNTER — Encounter: Payer: Self-pay | Admitting: General Surgery

## 2012-10-08 VITALS — BP 160/82 | HR 76 | Temp 97.0°F | Resp 14 | Ht 60.0 in | Wt 159.0 lb

## 2012-10-08 DIAGNOSIS — Z8719 Personal history of other diseases of the digestive system: Secondary | ICD-10-CM | POA: Insufficient documentation

## 2012-10-08 DIAGNOSIS — Z9889 Other specified postprocedural states: Secondary | ICD-10-CM

## 2012-10-08 DIAGNOSIS — K4090 Unilateral inguinal hernia, without obstruction or gangrene, not specified as recurrent: Secondary | ICD-10-CM

## 2012-10-08 NOTE — Patient Instructions (Signed)
Patient may resume activities as tolerated, taking care to use proper technique when lifting (demonstrated) 

## 2012-10-08 NOTE — Progress Notes (Signed)
Subjective:     Patient ID: Kathleen Paul, female   DOB: 05-13-1936, 77 y.o.   MRN: [id number]  HPI Patient here today for postop visit right inguinal hernia repair 09-23-12. Complaints of subnormal temps since surgery, pain improving.  Review of Systems  Constitutional: Negative.   Respiratory: Negative.   Cardiovascular: Negative.   Gastrointestinal: Negative.   Genitourinary: Negative.        Objective:   Physical Exam  Constitutional: She is oriented to person, place, and time. She appears well-developed and well-nourished.  Neurological: She is alert and oriented to person, place, and time.  Skin: Skin is warm and dry.   there is no edema or significant bruising at the surgical site. The wound is healing well. No weakness with cough the     Assessment:     Doing well status post right inguinal hernia repair.    Plan:     The patient may increase her activities as tolerated. Follow up will be on an as-needed basis.

## 2012-10-09 ENCOUNTER — Encounter: Payer: Self-pay | Admitting: General Surgery

## 2012-10-21 ENCOUNTER — Other Ambulatory Visit: Payer: Self-pay | Admitting: *Deleted

## 2012-10-21 MED ORDER — MIRABEGRON ER 25 MG PO TB24: 25.0000 mg | ORAL_TABLET | Freq: Every day | ORAL | Status: DC

## 2012-10-21 NOTE — Telephone Encounter (Signed)
Last filled 09/21/12 

## 2012-10-30 ENCOUNTER — Other Ambulatory Visit: Payer: Self-pay | Admitting: *Deleted

## 2012-10-30 MED ORDER — MIRABEGRON ER 25 MG PO TB24: 25.0000 mg | ORAL_TABLET | Freq: Every day | ORAL | Status: DC

## 2012-12-23 ENCOUNTER — Other Ambulatory Visit: Payer: Self-pay | Admitting: *Deleted

## 2012-12-23 NOTE — Telephone Encounter (Signed)
Yes ok to refill 

## 2012-12-23 NOTE — Telephone Encounter (Signed)
Received faxed refill request from pharmacy. Last office visit 08/20/12. Is it okay to refill?

## 2012-12-23 NOTE — Telephone Encounter (Signed)
How many with how many refills

## 2012-12-24 NOTE — Telephone Encounter (Signed)
Dr Dayton Martes, you have never filled this medicine for the patient before.  She says it was originally given to her by Tomi Bamberger.  Pt states she takes one twice a day.  Please advise on quantity, since it's going to mail order pharmacy.  Thanks.

## 2012-12-24 NOTE — Telephone Encounter (Signed)
No refills

## 2012-12-25 MED ORDER — CLONAZEPAM 0.5 MG PO TABS: ORAL_TABLET | ORAL | Status: DC

## 2012-12-25 NOTE — Telephone Encounter (Signed)
Script faxed to pharmacy.  [phone], fax number.

## 2012-12-25 NOTE — Telephone Encounter (Signed)
Ok to refill 3 months supply as she has been taking it.

## 2013-01-06 ENCOUNTER — Ambulatory Visit: Payer: Medicare Other | Admitting: Family Medicine

## 2013-01-09 ENCOUNTER — Encounter: Payer: Self-pay | Admitting: *Deleted

## 2013-01-13 ENCOUNTER — Ambulatory Visit (INDEPENDENT_AMBULATORY_CARE_PROVIDER_SITE_OTHER): Payer: Medicare Other | Admitting: Family Medicine

## 2013-01-13 ENCOUNTER — Encounter: Payer: Self-pay | Admitting: Family Medicine

## 2013-01-13 VITALS — BP 132/72 | HR 60 | Temp 97.9°F | Wt 158.0 lb

## 2013-01-13 DIAGNOSIS — E039 Hypothyroidism, unspecified: Secondary | ICD-10-CM

## 2013-01-13 DIAGNOSIS — R002 Palpitations: Secondary | ICD-10-CM

## 2013-01-13 DIAGNOSIS — I10 Essential (primary) hypertension: Secondary | ICD-10-CM

## 2013-01-13 DIAGNOSIS — I2510 Atherosclerotic heart disease of native coronary artery without angina pectoris: Secondary | ICD-10-CM

## 2013-01-13 DIAGNOSIS — R32 Unspecified urinary incontinence: Secondary | ICD-10-CM

## 2013-01-13 DIAGNOSIS — E785 Hyperlipidemia, unspecified: Secondary | ICD-10-CM

## 2013-01-13 LAB — TSH: TSH: 2.49 u[IU]/mL (ref 0.35–5.50)

## 2013-01-13 LAB — COMPREHENSIVE METABOLIC PANEL
AST: 19 U/L (ref 0–37)
Alkaline Phosphatase: 53 U/L (ref 39–117)
Glucose, Bld: 82 mg/dL (ref 70–99)
Potassium: 4.2 mEq/L (ref 3.5–5.1)
Sodium: 134 mEq/L — ABNORMAL LOW (ref 135–145)
Total Bilirubin: 1 mg/dL (ref 0.3–1.2)
Total Protein: 6.7 g/dL (ref 6.0–8.3)

## 2013-01-13 LAB — LIPID PANEL
Cholesterol: 114 mg/dL (ref 0–200)
LDL Cholesterol: 59 mg/dL (ref 0–99)
Total CHOL/HDL Ratio: 3
Triglycerides: 75 mg/dL (ref 0.0–149.0)
VLDL: 15 mg/dL (ref 0.0–40.0)

## 2013-01-13 MED ORDER — MIRABEGRON ER 50 MG PO TB24: 50.0000 mg | ORAL_TABLET | Freq: Every day | ORAL | Status: DC

## 2013-01-13 NOTE — Patient Instructions (Addendum)
Great to see you. We will call you with your lab results.  I have refilled your Myrbetriq at 50 mg daily dose.

## 2013-01-13 NOTE — Progress Notes (Signed)
Subjective:    Patient ID: Kathleen Paul, female    DOB: 29-Aug-1935, 77 y.o.   MRN: [id number]  HPI  77 yo female with h/o CAD, HLD, hypothyroidism, and HTN here for follow up.  Doing well.  No recent CP.  Symptoms improved s/p esophageal dilatation.  CAD- followed by Dietrich Pates.  Last note reviewed from 03/2011. Strong FH of CAD.  On Metoprolol, ASA daily.  Hypothyroidism- on synthroid 75 mcg daily.   Denies any symptoms of hypo or hyperthyroidism. Lab Results  Component Value Date   TSH 1.818 08/09/2012    HTN- takes enalapril- HCTZ 10-25 mg- 1/2 tablet daily.  Per pt, very well controlled.  H/o white coat HTN- BP at home 90s-100s/70s. Cr 1.03 in 12/2011.  HLD- takes Zocor 40 mg daily and niaspan 1000 mg daily. Lab Results  Component Value Date   CHOL 130 08/09/2012   HDL 49 08/09/2012   LDLCALC 67 08/09/2012   TRIG 68 08/09/2012   CHOLHDL 2.7 08/09/2012     Stress incontinence- improved with Myrbetriq and less dry mouth!  Still having to get up a few times a night and wonders if we can increase dose.  Patient Active Problem List   Diagnosis Date Noted  . S/P right inguinal hernia repair 10/08/2012  . Suprapubic mass 08/20/2012  . Dysuria 08/09/2012  . Essential hypertension, benign   . Unspecified hypothyroidism   . UI (urinary incontinence)   . Stricture and stenosis of esophagus 04/11/2011  . Chest pain 03/29/2011  . Dyslipidemia 03/29/2011  . Hypertension 03/29/2011  . CAD (coronary artery disease) 03/29/2011  . Palpitations 03/29/2011   Past Medical History  Diagnosis Date  . Chest pain   . Dysphagia, unspecified(787.20)   . Anxiety state, unspecified   . Pure hypercholesterolemia   . Essential hypertension, benign   . Unspecified hypothyroidism   . Arthritis   . Vitamin D deficiency   . UI (urinary incontinence)   . Inguinal hernia without mention of obstruction or gangrene, unilateral or unspecified, (not specified as recurrent)     2014  . Umbilical  hernia without mention of obstruction or gangrene     2014   Past Surgical History  Procedure Laterality Date  . Laparoscopic hysterectomy    . Tonsillectomy    . Abdominal hysterectomy  2008  . Colonoscopy  2003    [facility]  . Hernia repair Right 09/23/2012    repair RIH   History  Substance Use Topics  . Smoking status: Former Smoker -- 0.50 packs/day for 30 years    Types: Cigarettes    Quit date: 07/10/1988  . Smokeless tobacco: Never Used  . Alcohol Use: No   Family History  Problem Relation Age of Onset  . Breast cancer Sister   . Breast cancer Maternal Aunt   . Heart disease Mother   . Heart disease Father   . Heart disease Brother     sister   Allergies  Allergen Reactions  . Codeine Nausea Only   Current Outpatient Prescriptions on File Prior to Visit  Medication Sig Dispense Refill  . aspirin 81 MG tablet Take 81 mg by mouth daily.        . calcium-vitamin D (CALCIUM + D) 250-125 MG-UNIT per tablet Take 1 tablet by mouth daily.        . clonazePAM (KLONOPIN) 0.5 MG tablet Take one by mouth twice a day.  180 tablet  0  . diclofenac (VOLTAREN) 75 MG EC  tablet Take 1 tablet (75 mg total) by mouth 2 (two) times daily.  120 tablet  3  . enalapril-hydrochlorothiazide (VASERETIC) 10-25 MG per tablet 1/2 TAB EVERY DAY        . levothyroxine (SYNTHROID, LEVOTHROID) 75 MCG tablet Take 1 tablet (75 mcg total) by mouth daily.  90 tablet  3  . metoprolol tartrate (LOPRESSOR) 25 MG tablet Take 25 mg by mouth. 1/2 tab morning and evening       . mirabegron ER (MYRBETRIQ) 25 MG TB24 Take 1 tablet (25 mg total) by mouth daily.  90 tablet  1  . NIASPAN 1000 MG CR tablet       . simvastatin (ZOCOR) 40 MG tablet Take 1 tablet (40 mg total) by mouth at bedtime.  90 tablet  3   No current facility-administered medications on file prior to visit.   The PMH, PSH, Social History, Family History, Medications, and allergies have been reviewed in [facility], and have been updated if  relevant.  Review of Systems See HPI No CP No SOB No n/v/d   Denies symptoms of hypo or hyperthyroidism Objective:   Physical Exam BP 132/72  Pulse 60  Temp(Src) 97.9 F (36.6 C)  Wt 158 lb (71.668 kg)  BMI 30.86 kg/m2  General:  Well-developed,well-nourished,in no acute distress; alert,appropriate and cooperative throughout examination Head:  normocephalic and atraumatic.   Eyes:  vision grossly intact, pupils equal, pupils round, and pupils reactive to light.   Ears:  R ear normal and L ear normal.   Nose:  no external deformity.   Mouth:  good dentition.   Neck:  No deformities, masses, or tenderness noted. Lungs:  Normal respiratory effort, chest expands symmetrically. Lungs are clear to auscultation, no crackles or wheezes. Heart:  Normal rate and regular rhythm. S1 and S2 normal without gallop, murmur, click, rub or other extra sounds. Abdomen:  Bowel sounds positive,abdomen soft and non-tender without masses, organomegaly or hernias noted. Msk:  No deformity or scoliosis noted of thoracic or lumbar spine.   Extremities:  No clubbing, cyanosis, edema, or deformity noted with normal full range of motion of all joints.   Neurologic:  alert & oriented X3 and gait normal.   Skin:  Intact without suspicious lesions or rashes Cervical Nodes:  No lymphadenopathy noted Axillary Nodes:  No palpable lymphadenopathy Psych:  Cognition and judgment appear intact. Alert and cooperative with normal attention span and concentration. No apparent delusions, illusions, hallucinations    Assessment & Plan:  1. Unspecified hypothyroidism Recheck thyroid function today. Continue current dose. - TSH - T4, Free  2. Essential hypertension, benign Well controlled on current meds. - Comprehensive metabolic panel  3. Dyslipidemia Has been well controlled. Recheck labs today. - Lipid Panel  4. CAD (coronary artery disease) Quiet. On Zocor, ASA, metoprolol.  5.  Hypertension Stable.  6. UI (urinary incontinence) Improved but remains symptomatic. Increase Myrbetriq to 50 mg daily.

## 2013-01-22 ENCOUNTER — Telehealth: Payer: Self-pay

## 2013-01-22 NOTE — Telephone Encounter (Signed)
[name] pts mother request information to allow [name] to link her email to MyChart. pts activation code given to [facility] after 3 pt identifiers given.

## 2013-02-07 ENCOUNTER — Encounter: Payer: Self-pay | Admitting: Radiology

## 2013-02-10 ENCOUNTER — Encounter: Payer: Self-pay | Admitting: Family Medicine

## 2013-02-10 ENCOUNTER — Other Ambulatory Visit (INDEPENDENT_AMBULATORY_CARE_PROVIDER_SITE_OTHER): Payer: Medicare Other

## 2013-02-10 DIAGNOSIS — I10 Essential (primary) hypertension: Secondary | ICD-10-CM

## 2013-02-10 LAB — RENAL FUNCTION PANEL
Albumin: 3.8 g/dL (ref 3.5–5.2)
BUN: 21 mg/dL (ref 6–23)
GFR: 48.37 mL/min — ABNORMAL LOW (ref >60.00)
Glucose, Bld: 74 mg/dL (ref 70–99)
Phosphorus: 3.6 mg/dL (ref 2.3–4.6)
Potassium: 4 mEq/L (ref 3.5–5.1)

## 2013-02-22 ENCOUNTER — Encounter: Payer: Self-pay | Admitting: Gastroenterology

## 2013-03-13 ENCOUNTER — Telehealth: Payer: Self-pay | Admitting: Gastroenterology

## 2013-03-13 NOTE — Telephone Encounter (Signed)
noted 

## 2013-03-13 NOTE — Telephone Encounter (Signed)
Noted that she declined recommended colonoscopy for colorectal cancer screening.

## 2013-04-29 ENCOUNTER — Ambulatory Visit (INDEPENDENT_AMBULATORY_CARE_PROVIDER_SITE_OTHER): Payer: Medicare Other

## 2013-04-29 DIAGNOSIS — Z23 Encounter for immunization: Secondary | ICD-10-CM

## 2013-05-14 ENCOUNTER — Telehealth: Payer: Self-pay | Admitting: *Deleted

## 2013-05-14 NOTE — Telephone Encounter (Signed)
Pt asked if orthotics were in.  I instructed pt to make an appt to pick up orthotic and transferred to a scheduler.

## 2013-05-22 ENCOUNTER — Encounter: Payer: Self-pay | Admitting: Podiatry

## 2013-05-22 ENCOUNTER — Ambulatory Visit: Payer: Medicare Other | Admitting: Podiatry

## 2013-05-22 VITALS — BP 136/86 | HR 86 | Resp 12

## 2013-05-22 DIAGNOSIS — M7750 Other enthesopathy of unspecified foot: Secondary | ICD-10-CM

## 2013-05-22 NOTE — Progress Notes (Signed)
Subjective:     Patient ID: Kathleen Paul, female   DOB: 12-23-1935, 77 y.o.   MRN: [id number]  HPI patient presents to pick up a new orthotics and states she wants to get her old orthotics redone   Review of Systems     Objective:   Physical Exam  Nursing note and vitals reviewed. Constitutional: She is oriented to person, place, and time. She appears well-developed.  Cardiovascular: Intact distal pulses.   Musculoskeletal: Normal range of motion.  Neurological: She is oriented to person, place, and time.  Skin: Skin is warm.   patient continues to have foot structural issues with pain when wearing flat shoes     Assessment:     Tendinitis secondary to foot structure both the    Plan:     Orthotics dispensed with all instructions and we will take second pair and have them fixed. Reappoint when ready

## 2013-06-19 ENCOUNTER — Encounter: Payer: Self-pay | Admitting: Podiatry

## 2013-07-17 ENCOUNTER — Encounter: Payer: Self-pay | Admitting: Family Medicine

## 2013-07-17 ENCOUNTER — Ambulatory Visit (INDEPENDENT_AMBULATORY_CARE_PROVIDER_SITE_OTHER): Payer: Medicare Other | Admitting: Family Medicine

## 2013-07-17 VITALS — BP 130/72 | HR 57 | Temp 97.6°F | Ht 60.0 in | Wt 157.5 lb

## 2013-07-17 DIAGNOSIS — E785 Hyperlipidemia, unspecified: Secondary | ICD-10-CM

## 2013-07-17 DIAGNOSIS — N898 Other specified noninflammatory disorders of vagina: Secondary | ICD-10-CM | POA: Insufficient documentation

## 2013-07-17 DIAGNOSIS — I10 Essential (primary) hypertension: Secondary | ICD-10-CM

## 2013-07-17 DIAGNOSIS — N289 Disorder of kidney and ureter, unspecified: Secondary | ICD-10-CM | POA: Insufficient documentation

## 2013-07-17 DIAGNOSIS — R079 Chest pain, unspecified: Secondary | ICD-10-CM

## 2013-07-17 DIAGNOSIS — E039 Hypothyroidism, unspecified: Secondary | ICD-10-CM

## 2013-07-17 DIAGNOSIS — R300 Dysuria: Secondary | ICD-10-CM

## 2013-07-17 DIAGNOSIS — N949 Unspecified condition associated with female genital organs and menstrual cycle: Secondary | ICD-10-CM

## 2013-07-17 LAB — POCT URINALYSIS DIPSTICK
BILIRUBIN UA: NEGATIVE
GLUCOSE UA: NEGATIVE
KETONES UA: NEGATIVE
NITRITE UA: NEGATIVE
PH UA: 7.5
Spec Grav, UA: <=1.005
Urobilinogen, UA: 0.2

## 2013-07-17 LAB — TSH: TSH: 3.37 u[IU]/mL (ref 0.35–5.50)

## 2013-07-17 LAB — LIPID PANEL
Cholesterol: 269 mg/dL — ABNORMAL HIGH (ref 0–200)
HDL: 63 mg/dL (ref >39.00)
Total CHOL/HDL Ratio: 4
Triglycerides: 72 mg/dL (ref 0.0–149.0)
VLDL: 14.4 mg/dL (ref 0.0–40.0)

## 2013-07-17 LAB — COMPREHENSIVE METABOLIC PANEL
ALT: 22 U/L (ref 0–35)
AST: 26 U/L (ref 0–37)
Albumin: 4 g/dL (ref 3.5–5.2)
Alkaline Phosphatase: 60 U/L (ref 39–117)
BUN: 27 mg/dL — ABNORMAL HIGH (ref 6–23)
CALCIUM: 9.1 mg/dL (ref 8.4–10.5)
CHLORIDE: 102 meq/L (ref 96–112)
CO2: 27 meq/L (ref 19–32)
Creatinine, Ser: 1 mg/dL (ref 0.4–1.2)
GFR: 56.05 mL/min — AB (ref >60.00)
Glucose, Bld: 88 mg/dL (ref 70–99)
POTASSIUM: 4.6 meq/L (ref 3.5–5.1)
SODIUM: 138 meq/L (ref 135–145)
TOTAL PROTEIN: 6.8 g/dL (ref 6.0–8.3)
Total Bilirubin: 1 mg/dL (ref 0.3–1.2)

## 2013-07-17 LAB — LDL CHOLESTEROL, DIRECT: LDL DIRECT: 180.2 mg/dL

## 2013-07-17 LAB — T4, FREE: FREE T4: 1.33 ng/dL (ref 0.60–1.60)

## 2013-07-17 MED ORDER — CIPROFLOXACIN HCL 250 MG PO TABS: 250.0000 mg | ORAL_TABLET | Freq: Two times a day (BID) | ORAL | Status: DC

## 2013-07-17 MED ORDER — LEVOTHYROXINE SODIUM 75 MCG PO TABS: 75.0000 ug | ORAL_TABLET | Freq: Every day | ORAL | Status: DC

## 2013-07-17 MED ORDER — ENALAPRIL-HYDROCHLOROTHIAZIDE 10-25 MG PO TABS: ORAL_TABLET | ORAL | Status: DC

## 2013-07-17 MED ORDER — CLONAZEPAM 0.5 MG PO TABS: ORAL_TABLET | ORAL | Status: DC

## 2013-07-17 MED ORDER — METOPROLOL TARTRATE 25 MG PO TABS
25.0000 mg | ORAL_TABLET | Freq: Every day | ORAL | Status: DC
Start: 2013-07-17 — End: 2014-01-29

## 2013-07-17 NOTE — Assessment & Plan Note (Signed)
UA positive- + LE, + RBC Will treat with cipro 250 mg twice daily x 5 days and send urine for cx.

## 2013-07-17 NOTE — Patient Instructions (Signed)
Great to see you. Take cipro - 1 tablet twice daily for 5 days. We will call you next week with your urine culture results.  Dr. Rockey Situ is a wonderful cardiologist.

## 2013-07-17 NOTE — Assessment & Plan Note (Signed)
Continue current dose of synthroid. Recheck thyroid function today. Orders Placed This Encounter  Procedures  . Urine culture  . Comprehensive metabolic panel  . Lipid Panel  . TSH  . T4, Free  . Urinalysis Dipstick

## 2013-07-17 NOTE — Progress Notes (Signed)
Subjective:    Patient ID: Kathleen Paul, female    DOB: 12-11-1935, 78 y.o.   MRN: [id number]  HPI  78 yo female with h/o CAD, HLD, hypothyroidism, and HTN here for follow up with complaint of vaginal odor.    CAD- was followed by Dorris Carnes.  Strong FH of CAD.  On Metoprolol, ASA daily.  She would like to be referred to [facility] in [place].  Hypothyroidism- on synthroid 75 mcg daily.  Denies any symptoms of hypo or hyperthyroidism. Lab Results  Component Value Date   TSH 2.49 01/13/2013    HTN- takes enalapril- HCTZ 10-25 mg- 1/2 tablet daily.  Well controlled. Lab Results  Component Value Date   CREATININE 1.2 02/10/2013   Renal insufficiency- Likely due to NSAIDS.  She does still take occasional Voltaren.  Takes Tylenol as well.   HLD- takes Zocor 40 mg daily and niaspan 1000 mg daily.  Lab Results  Component Value Date   CHOL 114 01/13/2013   HDL 39.90 01/13/2013   LDLCALC 59 01/13/2013   TRIG 75.0 01/13/2013   CHOLHDL 3 01/13/2013   Vaginal odor- noticed for past 2 weeks.  No dysuria.  No back pain.  No n/v/d.   Patient Active Problem List   Diagnosis Date Noted  . S/P right inguinal hernia repair 10/08/2012  . Suprapubic mass 08/20/2012  . Dysuria 08/09/2012  . Essential hypertension, benign   . Unspecified hypothyroidism   . UI (urinary incontinence)   . Stricture and stenosis of esophagus 04/11/2011  . Chest pain 03/29/2011  . Dyslipidemia 03/29/2011  . Hypertension 03/29/2011  . CAD (coronary artery disease) 03/29/2011  . Palpitations 03/29/2011   Past Medical History  Diagnosis Date  . Chest pain   . Dysphagia, unspecified(787.20)   . Anxiety state, unspecified   . Pure hypercholesterolemia   . Essential hypertension, benign   . Unspecified hypothyroidism   . Arthritis   . Vitamin D deficiency   . UI (urinary incontinence)   . Inguinal hernia without mention of obstruction or gangrene, unilateral or unspecified, (not specified as recurrent)     2014  .  Umbilical hernia without mention of obstruction or gangrene     2014   Past Surgical History  Procedure Laterality Date  . Laparoscopic hysterectomy    . Tonsillectomy    . Abdominal hysterectomy  2008  . Colonoscopy  2003    [facility]  . Hernia repair Right 09/23/2012    repair [facility]IH   History  Substance Use Topics  . Smoking status: Former Smoker -- 0.50 packs/day for 30 years    Types: Cigarettes    Quit date: 07/10/1988  . Smokeless tobacco: Never Used  . Alcohol Use: No   Family History  Problem Relation Age of Onset  . Breast cancer Sister   . Breast cancer Maternal Aunt   . Heart disease Mother   . Heart disease Father   . Heart disease Brother     sister   Allergies  Allergen Reactions  . Codeine Nausea Only   Current Outpatient Prescriptions on File Prior to Visit  Medication Sig Dispense Refill  . aspirin 81 MG tablet Take 81 mg by mouth daily.        . calcium-vitamin D (CALCIUM + D) 250-125 MG-UNIT per tablet Take 1 tablet by mouth daily.        . diclofenac (VOLTAREN) 75 MG EC tablet Take 1 tablet (75 mg total) by mouth 2 (two) times daily.  120 tablet  3  . simvastatin (ZOCOR) 40 MG tablet Take 1 tablet (40 mg total) by mouth at bedtime.  90 tablet  3   No current facility-administered medications on file prior to visit.   The PMH, PSH, Social History, Family History, Medications, and allergies have been reviewed in [facility], and have been updated if relevant.  Review of Systems See HPI No CP No SOB No n/v/d   Denies symptoms of hypo or hyperthyroidism Objective:   Physical Exam BP 130/72  Pulse 57  Temp(Src) 97.6 F (36.4 C) (Oral)  Ht 5' (1.524 m)  Wt 157 lb 8 oz (71.442 kg)  BMI 30.76 kg/m2  SpO2 97%  General:  Well-developed,well-nourished,in no acute distress; alert,appropriate and cooperative throughout examination Head:  normocephalic and atraumatic.   Eyes:  vision grossly intact, pupils equal, pupils round, and pupils reactive to  light.   Ears:  R ear normal and L ear normal.   Nose:  no external deformity.   Mouth:  good dentition.   Neck:  No deformities, masses, or tenderness noted. Lungs:  Normal respiratory effort, chest expands symmetrically. Lungs are clear to auscultation, no crackles or wheezes. Heart:  Normal rate and regular rhythm. S1 and S2 normal without gallop, murmur, click, rub or other extra sounds. Abdomen:  Bowel sounds positive,abdomen soft and non-tender without masses, organomegaly or hernias noted. No CVA tenderness Msk:  No deformity or scoliosis noted of thoracic or lumbar spine.   Extremities:  No clubbing, cyanosis, edema, or deformity noted with normal full range of motion of all joints.   Neurologic:  alert & oriented X3 and gait normal.   Skin:  Intact without suspicious lesions or rashes Cervical Nodes:  No lymphadenopathy noted Axillary Nodes:  No palpable lymphadenopathy Psych:  Cognition and judgment appear intact. Alert and cooperative with normal attention span and concentration. No apparent delusions, illusions, hallucinations    Assessment & Plan:

## 2013-07-17 NOTE — Assessment & Plan Note (Signed)
Stable on current rx.  

## 2013-07-17 NOTE — Assessment & Plan Note (Signed)
She is using NSAIDs much more sparingly now. Will recheck renal function today.

## 2013-07-17 NOTE — Progress Notes (Signed)
Pre-visit discussion using our clinic review tool. No additional management support is needed unless otherwise documented below in the visit note.  

## 2013-07-17 NOTE — Assessment & Plan Note (Signed)
Recheck labs today. No changes- continue Zocor.

## 2013-07-18 ENCOUNTER — Encounter: Payer: Self-pay | Admitting: Family Medicine

## 2013-07-18 ENCOUNTER — Telehealth: Payer: Self-pay | Admitting: Family Medicine

## 2013-07-18 NOTE — Telephone Encounter (Signed)
Relevant patient education assigned to patient using Emmi. ° °

## 2013-07-19 LAB — URINE CULTURE
COLONY COUNT: NO GROWTH
Organism ID, Bacteria: NO GROWTH

## 2013-07-25 ENCOUNTER — Telehealth: Payer: Self-pay

## 2013-07-25 NOTE — Telephone Encounter (Signed)
Kathleen Paul with [facility] request clarification of instructions for metoprolol tartrate 25 mg. Is pt to take 1 tab daily in addition to 1/2 tab in AM and 1/2 in evening or should pt take 1/2 tab in AM and 1/2 in evening.Please advise. Call back [facility] [phone] using ref # [id number].

## 2013-07-25 NOTE — Telephone Encounter (Signed)
See belwo

## 2013-07-25 NOTE — Telephone Encounter (Signed)
Yes but please clarify with pt- ask her how she is taking it please.

## 2013-07-25 NOTE — Telephone Encounter (Signed)
Spoke to pt who states that she takes 1 & 1/2 tab in the evening. She states that she takes other BP med in the am

## 2013-07-25 NOTE — Telephone Encounter (Signed)
Med list directions read "Take 1 tablet by mouth daily.  1/2 tab in morning and evening."  [facility] needs clarification as to whether the pt should be taking 1 tablet TOTAL per day (1/2 in the morning and 1/2 in the evening) OR 1 tablet daily plus 1/2 tab in morning and evening.  What pt says she is taking does not match instructions listed on med list.

## 2013-07-28 ENCOUNTER — Other Ambulatory Visit: Payer: Self-pay | Admitting: *Deleted

## 2013-07-28 NOTE — Telephone Encounter (Signed)
Ok to change and refill as she is taking it.

## 2013-07-28 NOTE — Telephone Encounter (Signed)
Spoke to pt who verified dosage. Pt states that she takes enalapril-hydrochlorothiazide in the am and takes 1 & 1/2 tab of metoprolol in the evening as she previously stated. Pls advise on dosages. Pharmacist can be reached at [phone] directly for immediate refill.

## 2013-07-28 NOTE — Telephone Encounter (Signed)
Spoke to [employer] at [facility] and provided new sig for metoprolol. Pt contacted and informed Rx has been sent

## 2013-07-28 NOTE — Telephone Encounter (Signed)
Please call pt one more time to clarify she is taking 1 and 1/2 tab qhs.  If that is correct, please change directions in epic med list.

## 2013-08-07 ENCOUNTER — Encounter: Payer: Self-pay | Admitting: Family Medicine

## 2013-08-07 ENCOUNTER — Ambulatory Visit (INDEPENDENT_AMBULATORY_CARE_PROVIDER_SITE_OTHER): Payer: Medicare Other | Admitting: Family Medicine

## 2013-08-07 VITALS — BP 126/68 | HR 53 | Temp 97.9°F | Ht 60.0 in | Wt 157.8 lb

## 2013-08-07 DIAGNOSIS — J069 Acute upper respiratory infection, unspecified: Secondary | ICD-10-CM

## 2013-08-07 MED ORDER — DOXYCYCLINE HYCLATE 100 MG PO TABS: 100.0000 mg | ORAL_TABLET | Freq: Two times a day (BID) | ORAL | Status: DC

## 2013-08-07 MED ORDER — BENZONATATE 100 MG PO CAPS: 100.0000 mg | ORAL_CAPSULE | Freq: Two times a day (BID) | ORAL | Status: DC | PRN

## 2013-08-07 NOTE — Progress Notes (Signed)
Pre-visit discussion using our clinic review tool. No additional management support is needed unless otherwise documented below in the visit note.  

## 2013-08-07 NOTE — Patient Instructions (Signed)
Great to see you. Take Tessalon as needed for cough.  Doxycycline antibiotic if your symptoms do not continue to improve.  Call me with an update.

## 2013-08-07 NOTE — Progress Notes (Signed)
SUBJECTIVE:  Kathleen Paul is a 78 y.o. female who complains of congestion and cough described as barky and dry for 5 days. She denies a history of anorexia, chest pain, chills, dizziness, fatigue, fevers, myalgias, shortness of breath, sweats, vomiting and weakness and denies a history of asthma. Patient denies smoke cigarettes.   Patient Active Problem List   Diagnosis Date Noted  . Acute upper respiratory infections of unspecified site 08/07/2013  . Renal insufficiency 07/17/2013  . Vaginal odor 07/17/2013  . S/P right inguinal hernia repair 10/08/2012  . Suprapubic mass 08/20/2012  . Dysuria 08/09/2012  . Essential hypertension, benign   . Unspecified hypothyroidism   . UI (urinary incontinence)   . Stricture and stenosis of esophagus 04/11/2011  . Chest pain 03/29/2011  . Dyslipidemia 03/29/2011  . Hypertension 03/29/2011  . CAD (coronary artery disease) 03/29/2011  . Palpitations 03/29/2011   Past Medical History  Diagnosis Date  . Chest pain   . Dysphagia, unspecified(787.20)   . Anxiety state, unspecified   . Pure hypercholesterolemia   . Essential hypertension, benign   . Unspecified hypothyroidism   . Arthritis   . Vitamin D deficiency   . UI (urinary incontinence)   . Inguinal hernia without mention of obstruction or gangrene, unilateral or unspecified, (not specified as recurrent)     2014  . Umbilical hernia without mention of obstruction or gangrene     2014   Past Surgical History  Procedure Laterality Date  . Laparoscopic hysterectomy    . Tonsillectomy    . Abdominal hysterectomy  2008  . Colonoscopy  2003    [facility]  . Hernia repair Right 09/23/2012    repair RIH   History  Substance Use Topics  . Smoking status: Former Smoker -- 0.50 packs/day for 30 years    Types: Cigarettes    Quit date: 07/10/1988  . Smokeless tobacco: Never Used  . Alcohol Use: No   Family History  Problem Relation Age of Onset  . Breast cancer Sister   .  Breast cancer Maternal Aunt   . Heart disease Mother   . Heart disease Father   . Heart disease Brother     sister   Allergies  Allergen Reactions  . Codeine Nausea Only   Current Outpatient Prescriptions on File Prior to Visit  Medication Sig Dispense Refill  . aspirin 81 MG tablet Take 81 mg by mouth daily.        . calcium-vitamin D (CALCIUM + D) 250-125 MG-UNIT per tablet Take 1 tablet by mouth daily.        . ciprofloxacin (CIPRO) 250 MG tablet Take 1 tablet (250 mg total) by mouth 2 (two) times daily.  10 tablet  0  . clonazePAM (KLONOPIN) 0.5 MG tablet Take one by mouth twice a day.  180 tablet  0  . diclofenac (VOLTAREN) 75 MG EC tablet Take 1 tablet (75 mg total) by mouth 2 (two) times daily.  120 tablet  3  . enalapril-hydrochlorothiazide (VASERETIC) 10-25 MG per tablet 1/2 TAB EVERY DAY  45 tablet  0  . levothyroxine (SYNTHROID, LEVOTHROID) 75 MCG tablet Take 1 tablet (75 mcg total) by mouth daily.  90 tablet  0  . metoprolol tartrate (LOPRESSOR) 25 MG tablet Take 1 tablet (25 mg total) by mouth daily. 1/2 tab morning and evening  135 tablet  0  . simvastatin (ZOCOR) 40 MG tablet Take 1 tablet (40 mg total) by mouth at bedtime.  90 tablet  3   No current facility-administered medications on file prior to visit.   The PMH, PSH, Social History, Family History, Medications, and allergies have been reviewed in [facility], and have been updated if relevant.  OBJECTIVE: She appears well, vital signs are as noted. Ears normal.  Throat and pharynx normal.  Neck supple. No adenopathy in the neck. Nose is congested. Sinuses non tender. The chest is clear, without wheezes or rales.  ASSESSMENT:  viral upper respiratory illness  PLAN: She declines steroid burst. Allergic to codeine.  Tessalon as needed for cough.   Symptomatic therapy suggested: push fluids, rest and return office visit prn if symptoms persist or worsen. Lack of antibiotic effectiveness discussed with her but given rx for  doxycycline to take if symptoms progress or do not improve as anticipated.

## 2013-10-27 ENCOUNTER — Other Ambulatory Visit: Payer: Self-pay | Admitting: Dermatology

## 2014-01-29 ENCOUNTER — Encounter: Payer: Self-pay | Admitting: Cardiovascular Disease

## 2014-01-29 ENCOUNTER — Ambulatory Visit (INDEPENDENT_AMBULATORY_CARE_PROVIDER_SITE_OTHER): Payer: Medicare Other | Admitting: Cardiovascular Disease

## 2014-01-29 VITALS — BP 148/80 | HR 64 | Ht 60.0 in | Wt 162.5 lb

## 2014-01-29 DIAGNOSIS — R232 Flushing: Secondary | ICD-10-CM

## 2014-01-29 DIAGNOSIS — R000 Tachycardia, unspecified: Secondary | ICD-10-CM

## 2014-01-29 DIAGNOSIS — R5381 Other malaise: Secondary | ICD-10-CM

## 2014-01-29 DIAGNOSIS — E785 Hyperlipidemia, unspecified: Secondary | ICD-10-CM

## 2014-01-29 DIAGNOSIS — R5383 Other fatigue: Secondary | ICD-10-CM | POA: Insufficient documentation

## 2014-01-29 DIAGNOSIS — R531 Weakness: Secondary | ICD-10-CM | POA: Insufficient documentation

## 2014-01-29 DIAGNOSIS — I10 Essential (primary) hypertension: Secondary | ICD-10-CM

## 2014-01-29 DIAGNOSIS — I2510 Atherosclerotic heart disease of native coronary artery without angina pectoris: Secondary | ICD-10-CM

## 2014-01-29 DIAGNOSIS — R5382 Chronic fatigue, unspecified: Secondary | ICD-10-CM

## 2014-01-29 NOTE — Patient Instructions (Signed)
You are doing well. If blood pressure runs low, Hold the enalapril HCTZ for a few weeks to see if your energy and weakness gets better  Alternative would be to hold the metoprolol for a few weeks to see if you feel better  Hold the fish oil  Please call us if you have new issues that need to be addressed before your next appt.

## 2014-01-29 NOTE — Progress Notes (Signed)
Patient ID: TATA TIMMINS, female    DOB: 1936/01/28, 78 y.o.   MRN: [id number]  HPI Comments: Ms Amick is a pleasant 78 year old woman with a history of hyperlipidemia, hypertension, hypothyroidism who presents for new patient evaluation for her symptoms of flushing, low blood pressure, fatigue with poor energy.  She states that she walks 3 miles per day and has been doing so for quite some time. In general she has not noticed any change in her exercise tolerance. No shortness of breath with exertion, no chest pain. Her biggest concern is flushing which seems to happen in her face almost everyday, possibly every other day. It lasts for a short period of time. She has had these symptoms for the last 3 weeks. She denies any new vitamins or herbs or any prescription medications  She did try to hold her cholesterol medication in early 2015 and cholesterol jumped up to 270 She is tolerating simvastatin 40 mg daily  Her other big concern is low blood pressure. She reports that at home systolic pressures running 90-110. Systolic pressures confirmed by other family members, one of whom is a CNA both with her wrist cuff and brachial cuff. She reports that yesterday systolic pressure was 98, today 100, several days ago was 88. Enalapril HCTZ dose was cut in half and despite this blood pressure has continued to run low  Prior cardiac catheterization October 2011 showing mild to moderate LAD disease in the mid region, estimated at 50% per the report Ejection fraction 60%  EKG with normal sinus rhythm, rate 64 beats per minute T wave changes   Outpatient Encounter Prescriptions as of 01/29/2014  Medication Sig  . aspirin 81 MG tablet Take 81 mg by mouth daily.    . clonazePAM (KLONOPIN) 0.5 MG tablet Take one by mouth twice a day.  . diclofenac (VOLTAREN) 75 MG EC tablet Take 1 tablet (75 mg total) by mouth 2 (two) times daily.  . enalapril-hydrochlorothiazide (VASERETIC) 10-25 MG per tablet 1/2 TAB  EVERY DAY  . levothyroxine (SYNTHROID, LEVOTHROID) 75 MCG tablet Take 1 tablet (75 mcg total) by mouth daily.  . metoprolol tartrate (LOPRESSOR) 25 MG tablet Take 12.5 mg by mouth daily. Every evening  . simvastatin (ZOCOR) 40 MG tablet Take 1 tablet (40 mg total) by mouth at bedtime.    Review of Systems  Constitutional: Positive for fatigue.       Facial flushing  HENT: Negative.   Eyes: Negative.   Respiratory: Negative.   Cardiovascular: Negative.   Gastrointestinal: Negative.   Endocrine: Negative.   Musculoskeletal: Negative.   Skin: Negative.   Allergic/Immunologic: Negative.   Neurological: Negative.   Hematological: Negative.   Psychiatric/Behavioral: Negative.   All other systems reviewed and are negative.   BP 148/80  Pulse 64  Ht 5' (1.524 m)  Wt 162 lb 8 oz (73.71 kg)  BMI 31.74 kg/m2  Physical Exam  Nursing note and vitals reviewed. Constitutional: She is oriented to person, place, and time. She appears well-developed and well-nourished.  HENT:  Head: Normocephalic.  Nose: Nose normal.  Mouth/Throat: Oropharynx is clear and moist.  Eyes: Conjunctivae are normal. Pupils are equal, round, and reactive to light.  Neck: Normal range of motion. Neck supple. No JVD present.  Cardiovascular: Normal rate, regular rhythm, S1 normal, S2 normal, normal heart sounds and intact distal pulses.  Exam reveals no gallop and no friction rub.   No murmur heard. Pulmonary/Chest: Effort normal and breath sounds normal. No respiratory distress.  She has no wheezes. She has no rales. She exhibits no tenderness.  Abdominal: Soft. Bowel sounds are normal. She exhibits no distension. There is no tenderness.  Musculoskeletal: Normal range of motion. She exhibits no edema and no tenderness.  Lymphadenopathy:    She has no cervical adenopathy.  Neurological: She is alert and oriented to person, place, and time. Coordination normal.  Skin: Skin is warm and dry. No rash noted. No  erythema.  Psychiatric: She has a normal mood and affect. Her behavior is normal. Judgment and thought content normal.    Assessment and Plan

## 2014-01-29 NOTE — Assessment & Plan Note (Addendum)
Etiology of her facial flushing over the past 3 weeks is unclear. Possibly from low blood pressure typically this will present with lightheadedness or dizziness. She does report having very low blood pressures and she will hold her enalapril HCTZ for now with very close monitoring of her blood pressure. Suggested she hold her fish oils as triglycerides are well controlled. Also recommended she hold her multivitamin for one to 2 weeks to see if this makes any difference

## 2014-01-29 NOTE — Assessment & Plan Note (Signed)
Cholesterol is at goal on the current lipid regimen. No changes to the medications were made.  

## 2014-01-29 NOTE — Assessment & Plan Note (Signed)
Mild to moderate CAD in her mid LAD by catheterization in 2011. No symptoms concerning for angina at this time

## 2014-01-29 NOTE — Assessment & Plan Note (Signed)
Etiology of her fatigue is unclear. She still walking 3 miles per day which is unchanged from her usual routine. She reports sleep is excellent. Recommended she again hold the enalapril HCTZ. If energy does not improve with slightly higher blood pressure, could alternate and hold the metoprolol, restart enalapril HCTZ. TSH is in normal range. Unlikely that her mild to moderate CAD has progressed in the past few years given her well controlled cholesterol. Symptoms do not sound like angina as they seem to occur at rest. Unable to exclude depression

## 2014-01-29 NOTE — Assessment & Plan Note (Signed)
She reports very low blood pressure at home. Mildly elevated in the office today but she confirms systolic pressures that are very low at home despite decreasing the dose of enalapril HCTZ. We have suggested she hold the enalapril HCTZ and closely monitor her blood pressure at home. She can call our office with numbers

## 2014-03-10 ENCOUNTER — Telehealth: Payer: Self-pay

## 2014-03-10 NOTE — Telephone Encounter (Signed)
Spoke w/ pt.  She reports that she has been feeling fine, but last night had an episode. Reports that she got up to go to the bathroom, was washing her face and getting ready to go back to bed when felt a "weird sensation" come over legs. Reports that she felt as though her legs were going to buckle, but she did not fall, as she held on to the counter and towel rack.  Reports that the feeling got better after 10 mins of being in the bed.  On pt's last ov, she was instructed by Dr. Rockey Situ: "If blood pressure runs low,  Hold the enalapril HCTZ for a few weeks to see if your energy and weakness gets better  Alternative would be to hold the metoprolol for a few weeks to see if you feel better"  Pt reports that she held the enalapril HCTZ, but resumed 1/4 pill, as she was c/o ankle swelling. She attempted to hold her metoprolol, but c/o "skips and flip-flops", so she is now taking 1/4 of this pill, as well.  Reports that her highest systolic pressure was 945, which is very rare for her, with the average being in the 90s. She would like to know if any changes can be made to her meds, to avoid any more episodes.

## 2014-03-10 NOTE — Telephone Encounter (Signed)
Pt has a question regarding her BP medication. States she had an "episode" last night, states her legs had a "weird feeling" and felt like her legs were going to give way. States they felt this way for 10 minutes. Please call.

## 2014-03-11 NOTE — Telephone Encounter (Signed)
Pt called back, waiting to hear from call yesterday

## 2014-03-12 MED ORDER — HYDROCHLOROTHIAZIDE 25 MG PO TABS: 12.5000 mg | ORAL_TABLET | Freq: Every day | ORAL | Status: DC

## 2014-03-12 NOTE — Telephone Encounter (Signed)
Spoke w/ pt's daughter.  Advised her of Chris's recommendation.  She verbalizes understanding and will have pt call back w/ any questions or concerns.

## 2014-03-12 NOTE — Telephone Encounter (Signed)
I've reviewed her meds and history.  It sounds like she may be best served by changing her from enalapril-hctz to simply hctz.  She does not have a h/o LV dysfxn, diabetes, or CKD thus other than treating her HTN, there is not an absolute indication for an ACE inhibitor.  Let's continue lopressor 25mg  1/2 tablet daily to address her palpitations and HCTZ 25mg  1/2 tab daily to address her lower extremity edema.  In the absence of the enalapril, she may have a little more BP room so that she doesn't have to cut tabs into quarters.  She will need to let us know if her BP begins to trend up at which point we will have the option of going up to a full tab on one or both of her meds.

## 2014-03-17 ENCOUNTER — Ambulatory Visit (INDEPENDENT_AMBULATORY_CARE_PROVIDER_SITE_OTHER): Payer: Medicare Other | Admitting: Nurse Practitioner

## 2014-03-17 ENCOUNTER — Encounter: Payer: Self-pay | Admitting: Nurse Practitioner

## 2014-03-17 VITALS — BP 160/82 | HR 60 | Ht 63.0 in | Wt 161.5 lb

## 2014-03-17 DIAGNOSIS — E039 Hypothyroidism, unspecified: Secondary | ICD-10-CM

## 2014-03-17 DIAGNOSIS — R5381 Other malaise: Secondary | ICD-10-CM

## 2014-03-17 DIAGNOSIS — R5383 Other fatigue: Secondary | ICD-10-CM

## 2014-03-17 DIAGNOSIS — I10 Essential (primary) hypertension: Secondary | ICD-10-CM

## 2014-03-17 DIAGNOSIS — R5382 Chronic fatigue, unspecified: Secondary | ICD-10-CM

## 2014-03-17 NOTE — Progress Notes (Signed)
Patient Name: Kathleen Paul Date of Encounter: 03/17/2014  Primary Care Provider:  Arnette Norris, MD Primary Cardiologist:  Johnny Bridge, MD   Patient Profile  78 y/o female with a h/o HTN and ongoing fatigue who presents for f/u r/t low BP readings @ home.  Problem List   Past Medical History  Diagnosis Date  . Non-obstructive CAD     a. 04/2010 Myoview: EF 77%, fixed apical/inferior defect;  b. 04/2010 Cath: LAD 48m, LCX/RCA minor irregs, EF 60%;  c. 04/2011 Myoview: low risk.  Marland Kitchen Dysphagia, unspecified(787.20)   . Anxiety state, unspecified   . Pure hypercholesterolemia   . Essential hypertension, benign   . Unspecified hypothyroidism   . Arthritis   . Vitamin D deficiency   . UI (urinary incontinence)   . Inguinal hernia without mention of obstruction or gangrene, unilateral or unspecified, (not specified as recurrent)     2014  . Umbilical hernia without mention of obstruction or gangrene     2014  . History of DVT (deep vein thrombosis)     legs  . Mild mitral regurgitation     a. 04/2010 Echo: nl EF, mild diast dysfxn, trace PR, mild TR, mild-mod MR.   Past Surgical History  Procedure Laterality Date  . Laparoscopic hysterectomy    . Tonsillectomy    . Abdominal hysterectomy  2008  . Colonoscopy  2003    [facility]  . Hernia repair Right 09/23/2012    repair RIH  . Cardiac catheterization  2011    [facility]    Allergies  Allergies  Allergen Reactions  . Codeine Nausea Only    HPI  78 y/o female with the above problem list.  She has a h/o HTN, palpitations, and mild lower ext edema.  She also has been experiencing fatigue over the past few months, which she attributes to low blood pressures.  She checks her pressure @ home via a wrist cuff and often gets pressures in the 90's to low 100's.  She occasionally feels lightheaded but her biggest complaint has more to do with overall lack of energy.  When she was seen in July, she was advised to hold  enalapril-hctz on days that she felt poorly.  Recently, she called the office to report that blood pressures have been low.  She had also had an episode of leg wkns after getting up to use the bathroom in the middle of the night.  She didn't like to hold enalapril-hctz b/c then she would get mild LEE and didn't like to hold metoprolol 2/2 evening palpitations.  We recommended that she switch from enalapril-hctz to simply hctz 12.5mg  daily, thinking that his may provide a little more BP room to continue bb therapy and limit both swelling and palpitations.  She says that she felt really good on Sunday but since yesterday, she has had return of fatigue.  She denies chest pain, palpitations, dyspnea, pnd, orthopnea, n, v, dizziness, syncope, edema, weight gain, or early satiety.   In the office today, she has records of her BPs over the past few wks.  Systolics are typically in the 90's to low 100's with HR's in the 50's to 60's.  Today however, BP is 160/82 and 154/72 on repeat.  On her wrist cuff, with her left arm bent and wrist in front of her heart, her BP is 132/64.  With her left arm extended @ the level of her heart, her reading on the wrist cuff is 148/68.  Despite relative hypertension today, she still reports fatigue.  Home Medications  Prior to Admission medications   Medication Sig Start Date End Date Taking? Authorizing Provider  aspirin 81 MG tablet Take 81 mg by mouth daily.     Yes Historical Provider, MD  clonazePAM (KLONOPIN) 0.5 MG tablet Take one by mouth twice a day. 07/17/13  Yes Lucille Passy, MD  diclofenac (VOLTAREN) 75 MG EC tablet Take 1 tablet (75 mg total) by mouth 2 (two) times daily. 08/09/12  Yes Lucille Passy, MD  hydrochlorothiazide (HYDRODIURIL) 25 MG tablet Take 0.5 tablets (12.5 mg total) by mouth daily. 03/12/14  Yes Rogelia Mire, NP  levothyroxine (SYNTHROID, LEVOTHROID) 75 MCG tablet Take 1 tablet (75 mcg total) by mouth daily. 07/17/13  Yes Lucille Passy, MD    metoprolol tartrate (LOPRESSOR) 25 MG tablet Take 12.5 mg by mouth daily. Every evening 07/17/13  Yes Lucille Passy, MD  simvastatin (ZOCOR) 40 MG tablet Take 1 tablet (40 mg total) by mouth at bedtime. 08/09/12  Yes Lucille Passy, MD    Review of Systems  Fatigue, intermittent palpitations, and intermittent lower ext edema as above.  She denies chest pain, palpitations, dyspnea, pnd, orthopnea, n, v, dizziness, syncope, edema, weight gain, or early satiety.  All other systems reviewed and are otherwise negative except as noted above.  Physical Exam  Blood pressure 160/82, pulse 60, height 5\' 3"  (1.6 m), weight 161 lb 8 oz (73.256 kg).  General: Pleasant, NAD Psych: Normal affect. Neuro: Alert and oriented X 3. Moves all extremities spontaneously. HEENT: Normal  Neck: Supple without bruits or JVD. Lungs:  Resp regular and unlabored, CTA. Heart: RRR no s3, s4, or murmurs. Abdomen: Soft, non-tender, non-distended, BS + x 4.  Extremities: No clubbing, cyanosis or edema. DP/PT/Radials 2+ and equal bilaterally.  Accessory Clinical Findings  CBC, TSH pending  Assessment & Plan  1.  Fatigue:  Pt returns with a several month h/o fatigue, which she attributes to low blood pressures.  We checked her wrist cuff against a manual brachial cuff today and the accuracy of her wrist cuff improved with extending her arm instead of bending it.  Despite being hypertensive in clinic today, she still notes fatigue.  It is not clear that this is linked to her blood pressures.  I will check a TSH (nl in January, prior to onset of Ss) and cbc to r/o other causes of fatigue.  I've asked her to check her BP @ home with her arm extended as opposed to bent and record her findings and call us in a week.  2.  HTN/Relative hypotn:  See discussion above.  She will follow BPs this week and call us for advice next week.  She is currently on hctz 12.5mg  daily and lopressor 12.5mg  qpm.  If pressures are truly running low, she  thinks that she would tolerate going back to 1/4 of a lopressor tab (6.25mg ) qpm, w/o having additional palpitations.  We may also want to consider switching to low dose bisoprolol instead.  I did not make changes to her meds today despite elevated pressures.  3.  Hypothyroidism:  TSH pending.  4. Dispo:  She will call in a week with BP recordings.    Murray Hodgkins, NP 03/17/2014, 4:21 PM

## 2014-03-17 NOTE — Patient Instructions (Signed)
Please check your blood pressure once daily Call the office w/ readings next week  We will draw labs today:  CBC, TSH  Call or return to clinic prn if these symptoms worsen or fail to improve as anticipated.

## 2014-03-18 LAB — CBC WITH DIFFERENTIAL
BASOS ABS: 0 10*3/uL (ref 0.0–0.2)
Basos: 1 %
EOS ABS: 0.1 10*3/uL (ref 0.0–0.4)
Eos: 2 %
HEMATOCRIT: 33.7 % — AB (ref 34.0–46.6)
Hemoglobin: 11.3 g/dL (ref 11.1–15.9)
IMMATURE GRANULOCYTES: 0 %
Immature Grans (Abs): 0 10*3/uL (ref 0.0–0.1)
LYMPHS ABS: 1.5 10*3/uL (ref 0.7–3.1)
LYMPHS: 24 %
MCH: 31.3 pg (ref 26.6–33.0)
MCHC: 33.5 g/dL (ref 31.5–35.7)
MCV: 93 fL (ref 79–97)
MONOCYTES: 7 %
Monocytes Absolute: 0.5 10*3/uL (ref 0.1–0.9)
NEUTROS ABS: 4.2 10*3/uL (ref 1.4–7.0)
Neutrophils Relative %: 66 %
PLATELETS: 341 10*3/uL (ref 150–379)
RBC: 3.61 x10E6/uL — ABNORMAL LOW (ref 3.77–5.28)
RDW: 13.7 % (ref 12.3–15.4)
WBC: 6.4 10*3/uL (ref 3.4–10.8)

## 2014-03-18 LAB — TSH: TSH: 6.07 u[IU]/mL — ABNORMAL HIGH (ref 0.450–4.500)

## 2014-04-24 ENCOUNTER — Other Ambulatory Visit: Payer: Self-pay

## 2014-05-11 ENCOUNTER — Encounter: Payer: Self-pay | Admitting: Nurse Practitioner

## 2014-06-08 ENCOUNTER — Telehealth: Payer: Self-pay

## 2014-06-08 NOTE — Telephone Encounter (Signed)
Spoke to pt, pt stated they would call back to schedule AWV.

## 2014-08-05 ENCOUNTER — Telehealth: Payer: Self-pay | Admitting: Gastroenterology

## 2014-08-05 NOTE — Telephone Encounter (Signed)
Ok with me, but I do not know who her primary [facility] MD is Colon with Dr. Fuller Plan and most recently, though 2012 EGD with Dr. Deatra Ina Primary GI MD must also approve

## 2014-08-05 NOTE — Telephone Encounter (Signed)
Please advise 

## 2014-08-06 NOTE — Telephone Encounter (Signed)
Objections to her changing to Dr Hilarie Fredrickson?

## 2014-08-06 NOTE — Telephone Encounter (Signed)
We are good to go on this.

## 2014-08-06 NOTE — Telephone Encounter (Signed)
Last office visit and procedure were performed with Dr. Deatra Ina.

## 2014-08-06 NOTE — Telephone Encounter (Signed)
ok 

## 2014-08-11 DIAGNOSIS — E780 Pure hypercholesterolemia: Secondary | ICD-10-CM | POA: Diagnosis not present

## 2014-08-11 DIAGNOSIS — I10 Essential (primary) hypertension: Secondary | ICD-10-CM | POA: Diagnosis not present

## 2014-08-11 DIAGNOSIS — E559 Vitamin D deficiency, unspecified: Secondary | ICD-10-CM | POA: Diagnosis not present

## 2014-08-11 DIAGNOSIS — F419 Anxiety disorder, unspecified: Secondary | ICD-10-CM | POA: Diagnosis not present

## 2014-08-11 DIAGNOSIS — E039 Hypothyroidism, unspecified: Secondary | ICD-10-CM | POA: Diagnosis not present

## 2014-08-11 NOTE — Telephone Encounter (Signed)
Pt scheduled  

## 2014-08-11 NOTE — Telephone Encounter (Signed)
ok 

## 2014-09-18 ENCOUNTER — Encounter: Payer: Self-pay | Admitting: *Deleted

## 2014-09-30 ENCOUNTER — Telehealth: Payer: Self-pay | Admitting: *Deleted

## 2014-09-30 NOTE — Telephone Encounter (Signed)
Message left for patient to return my call and advise about flu vaccine. Advised to schedule nurse visit before 10/08/14 if she wanted it and to call to update chart if she already had it or declined it.

## 2014-10-01 ENCOUNTER — Encounter: Payer: Self-pay | Admitting: Internal Medicine

## 2014-10-01 ENCOUNTER — Ambulatory Visit (INDEPENDENT_AMBULATORY_CARE_PROVIDER_SITE_OTHER): Payer: Medicare Other | Admitting: Internal Medicine

## 2014-10-01 VITALS — BP 134/60 | HR 64 | Ht 60.35 in | Wt 159.4 lb

## 2014-10-01 DIAGNOSIS — R1013 Epigastric pain: Secondary | ICD-10-CM | POA: Diagnosis not present

## 2014-10-01 DIAGNOSIS — K219 Gastro-esophageal reflux disease without esophagitis: Secondary | ICD-10-CM | POA: Diagnosis not present

## 2014-10-01 DIAGNOSIS — K828 Other specified diseases of gallbladder: Secondary | ICD-10-CM | POA: Diagnosis not present

## 2014-10-01 DIAGNOSIS — R1314 Dysphagia, pharyngoesophageal phase: Secondary | ICD-10-CM

## 2014-10-01 MED ORDER — RANITIDINE HCL 150 MG PO TABS: 150.0000 mg | ORAL_TABLET | Freq: Two times a day (BID) | ORAL | Status: DC

## 2014-10-01 NOTE — Progress Notes (Signed)
Patient ID: Kathleen Paul, female   DOB: 02/12/1936, 79 y.o.   MRN: [id number] HPI: Kathleen Paul is a 79 year old female with a past medical history of GERD, hiatal hernia with Schatzki's ring, diverticulosis, hypertension, hyperlipidemia, hypothyroidism, remote DVT who seen in consultation at the request of Dr. Deborra Medina to evaluate dysphagia. She has a history of an upper endoscopy with dilation performed on 04/12/2011 by Dr. Deatra Ina. This revealed a 5 cm sliding hiatal hernia with a GE junction stricture. Dilated with 18 mm Maloney. Otherwise normal exam. She reports that the dysphagia she was experiencing, which was solid food, food such as bread and meat has resolved. She added Zantac over-the-counter each morning and this improved her swallowing dysfunction. Occasionally she is experiencing epigastric discomfort which seems to be worse at night. This seems to respond to an additional dose of Zantac. She denies nausea or vomiting. Reports good appetite. Denies weight loss. Denies heartburn. Reports bowel movements have been regular without blood in her stool or melena. She had a colonoscopy in the past but would like to not have another one.  Past Medical History  Diagnosis Date  . Non-obstructive CAD     a. 04/2010 Myoview: EF 77%, fixed apical/inferior defect;  b. 04/2010 Cath: LAD 76m, LCX/RCA minor irregs, EF 60%;  c. 04/2011 Myoview: low risk.  Marland Kitchen Anxiety state, unspecified   . Pure hypercholesterolemia   . Essential hypertension, benign   . Unspecified hypothyroidism   . Arthritis   . Vitamin D deficiency   . UI (urinary incontinence)   . Inguinal hernia without mention of obstruction or gangrene, unilateral or unspecified, (not specified as recurrent)     2014  . Umbilical hernia without mention of obstruction or gangrene     2014  . History of DVT (deep vein thrombosis)     legs  . Mild mitral regurgitation     a. 04/2010 Echo: nl EF, mild diast dysfxn, trace PR, mild TR, mild-mod MR.  .  Diverticulosis   . Internal hemorrhoids   . Esophageal stricture   . Scoliosis   . Atherosclerosis     Past Surgical History  Procedure Laterality Date  . Laparoscopic hysterectomy    . Tonsillectomy    . Abdominal hysterectomy  2008  . Colonoscopy  2003    [facility]  . Hernia repair Right 09/23/2012    repair RIH  . Cardiac catheterization  2011    [facility]    Outpatient Prescriptions Prior to Visit  Medication Sig Dispense Refill  . aspirin 81 MG tablet Take 81 mg by mouth daily.      . clonazePAM (KLONOPIN) 0.5 MG tablet Take one by mouth twice a day. 180 tablet 0  . diclofenac (VOLTAREN) 75 MG EC tablet Take 1 tablet (75 mg total) by mouth 2 (two) times daily. 120 tablet 3  . metoprolol tartrate (LOPRESSOR) 25 MG tablet Take 12.5 mg by mouth daily. Every evening    . simvastatin (ZOCOR) 40 MG tablet Take 1 tablet (40 mg total) by mouth at bedtime. 90 tablet 3  . hydrochlorothiazide (HYDRODIURIL) 25 MG tablet Take 0.5 tablets (12.5 mg total) by mouth daily. 90 tablet 3  . levothyroxine (SYNTHROID, LEVOTHROID) 75 MCG tablet Take 1 tablet (75 mcg total) by mouth daily. 90 tablet 0   No facility-administered medications prior to visit.    Allergies  Allergen Reactions  . Codeine Nausea Only    Family History  Problem Relation Age of Onset  . Breast cancer  Sister   . Breast cancer Maternal Aunt   . Heart disease Mother   . Heart attack Mother   . Heart disease Father   . Heart attack Father   . Heart disease Brother     sister    History  Substance Use Topics  . Smoking status: Former Smoker -- 0.50 packs/day for 30 years    Types: Cigarettes    Quit date: 07/10/1988  . Smokeless tobacco: Never Used  . Alcohol Use: No    ROS: As per history of present illness, otherwise negative  BP 134/60 mmHg  Pulse 64  Ht 5' 0.35" (1.533 m)  Wt 159 lb 6 oz (72.292 kg)  BMI 30.76 kg/m2 Constitutional: Well-developed and well-nourished. No distress. HEENT:  Normocephalic and atraumatic. Oropharynx is clear and moist. No oropharyngeal exudate. Conjunctivae are normal.  No scleral icterus. Neck: Neck supple. Trachea midline. Cardiovascular: Normal rate, regular rhythm and intact distal pulses. No M/R/G Pulmonary/chest: Effort normal and breath sounds normal. No wheezing, rales or rhonchi. Abdominal: Soft, nontender, nondistended. Bowel sounds active throughout.  Extremities: no clubbing, cyanosis, or edema Lymphadenopathy: No cervical adenopathy noted. Neurological: Alert and oriented to person place and time. Skin: Skin is warm and dry. No rashes noted. Psychiatric: Normal mood and affect. Behavior is normal.  RELEVANT LABS AND IMAGING: CBC    Component Value Date/Time   WBC 6.4 03/17/2014 1410   RBC 3.61* 03/17/2014 1410   HGB 11.3 03/17/2014 1410   HCT 33.7* 03/17/2014 1410   PLT 341 03/17/2014 1410   MCV 93 03/17/2014 1410   MCH 31.3 03/17/2014 1410   MCHC 33.5 03/17/2014 1410   RDW 13.7 03/17/2014 1410   LYMPHSABS 1.5 03/17/2014 1410   EOSABS 0.1 03/17/2014 1410   BASOSABS 0.0 03/17/2014 1410    CMP     Component Value Date/Time   NA 138 07/17/2013 0828   K 4.6 07/17/2013 0828   CL 102 07/17/2013 0828   CO2 27 07/17/2013 0828   GLUCOSE 88 07/17/2013 0828   BUN 27* 07/17/2013 0828   CREATININE 1.0 07/17/2013 0828   CREATININE 1.03 08/09/2012 1440   CALCIUM 9.1 07/17/2013 0828   PROT 6.8 07/17/2013 0828   ALBUMIN 4.0 07/17/2013 0828   AST 26 07/17/2013 0828   ALT 22 07/17/2013 0828   ALKPHOS 60 07/17/2013 0828   BILITOT 1.0 07/17/2013 0828   Clinical Data: Right suprapubic mass.  Right groin pain and burning.   CT ABDOMEN AND PELVIS WITH CONTRAST   Technique:  Multidetector CT imaging of the abdomen and pelvis was performed following the standard protocol during bolus administration of intravenous contrast.   Contrast: 141mL OMNIPAQUE IOHEXOL 300 MG/ML  SOLN   Comparison: None.   Findings: The lung bases are  clear without focal nodule, mass, or airspace disease.  The heart size is normal.  No significant pleural or pericardial effusion is present.   The liver and spleen are within normal limits.  A moderate sized hiatal hernia is present.  The remainder the stomach and duodenum are within normal limits.  Pancreas is unremarkable.  Common bile duct is within normal limits.  There is increased density to the wall of the gallbladder.  No discrete mass is present.  Adrenal glands are within normal limits bilaterally.  A 7 mm posterior exophytic cyst is present on the right kidney.  The kidneys and ureters are otherwise within normal limits.   Diverticular changes are present in the rectosigmoid colon there is no focal  inflammation to suggest diverticulitis.  The remainder of the colon is unremarkable.  A focal loop of small bowel extends into the right inguinal canal.  There is no evidence for obstruction or strangulation.  This likely accounts for the palpable mass. A periumbilical hernia contains fat but no bowel.   The urinary bladder is within normal limits.  The patient is status post hysterectomy.  The ovaries are not discretely seen and may be surgically absent.   Moderate to leftward scoliosis is centered at L4.  A transitional S1 segment is present.  Degenerative anterolisthesis is present at L4-5.  Atherosclerotic calcifications are present in the aorta without aneurysm.   IMPRESSION:   1.  Small right to inguinal hernia.  The femoral hernia is considered less likely.  This contains a small bowel without obstruction.  This likely accounts for the palpable lesion. 2.  Sigmoid diverticulosis without diverticulitis. 3.  Atherosclerosis. 4.  Scoliosis. 5.  Small posterior right renal cyst. 6.  Calcified gallbladder wall.  This can be an indicator of increased risk for gallbladder cancer.  No discrete mass lesion is present. 7.  Moderate to large hiatal hernia. 8.  Small  periumbilical hernia without associated bowel.     Original Report Authenticated By: San Morelle, M.D.  ASSESSMENT/PLAN: 79 year old female with a past medical history of GERD, hiatal hernia with Schatzki's ring, diverticulosis, hypertension, hyperlipidemia, hypothyroidism, remote DVT who seen in consultation at the request of Dr. Deborra Medina to evaluate dysphagia.   1. GERD/hiatal hernia/Schatzki's ring/dysphagia now improved -- she reported solid food dysphagia but this has improved with acid suppression therapy. I discussed her prior endoscopy and dilation and esophageal stricture felt reflux related. There was no evidence of Barrett's esophagus at time of last endoscopy. We discussed repeat endoscopy with dilation but she would like to avoid this. Fortunately her dysphagia symptoms have improved and for this reason I do not think EGD is actually necessary at this time. I would like her to use ranitidine 150 mg twice daily for more complete acid suppression. This also may help prevent her mild epigastric discomfort worse at night. I asked that she notify me should dysphagia return and if so upper endoscopy would be recommended. She voiced understanding.  2. Epigastric pain/abnormal gallbladder imaging -- while her epigastric pain may be reflux or dyspepsia related, I recommend abdominal ultrasound to evaluate gallbladder. There was calcification in the gallbladder wall seen on a CT scan performed several years ago. Calcification of the gallbladder can raise the risk of gallbladder cancer. Reimaging is to reevaluate gallbladder and exclude this as a source for nocturnal pain.  3. CRC screening -- no history of colon polyps, last colonoscopy 2004 performed by Dr. Fuller Plan. She reports having read where colonoscopy usually stops around 75 for screening. I told her that this is correct usually between ages of 28 and 52 screening colonoscopy is stopped. She does not desire further colorectal cancer screening  and particularly no further colonoscopy. For this reason we are discontinuing colonoscopy for colon cancer screening based on age  Return in 6 months, sooner if needed    Cc:Lucille Passy, Md [address] [place], [place] [address]

## 2014-10-01 NOTE — Patient Instructions (Signed)
We have sent the following medications to your pharmacy for you to pick up at your convenience: Ranitidine 150 mg twice daily  Per your request, we will defer colonoscopy due to age.  Please follow up with Dr Hilarie Fredrickson in 6 months.  You have been scheduled for an abdominal ultrasound at [facility] (1st floor of hospital) on 10/08/14 at 9:30 am. Please arrive 15 minutes prior to your appointment for registration. Make certain not to have anything to eat or drink 6 hours prior to your appointment. Should you need to reschedule your appointment, please contact radiology at [phone]. This test typically takes about 30 minutes to perform.  CC:Dr T.Marjory Lies

## 2014-10-08 ENCOUNTER — Ambulatory Visit (HOSPITAL_COMMUNITY)
Admission: RE | Admit: 2014-10-08 | Discharge: 2014-10-08 | Disposition: A | Discharge status: Home / Self Care | Payer: Medicare Other | Source: Ambulatory Visit | Attending: Internal Medicine | Admitting: Internal Medicine

## 2014-10-08 DIAGNOSIS — K8020 Calculus of gallbladder without cholecystitis without obstruction: Secondary | ICD-10-CM | POA: Diagnosis not present

## 2014-10-08 DIAGNOSIS — R109 Unspecified abdominal pain: Secondary | ICD-10-CM | POA: Diagnosis not present

## 2014-10-08 DIAGNOSIS — K828 Other specified diseases of gallbladder: Secondary | ICD-10-CM

## 2014-10-08 DIAGNOSIS — R1013 Epigastric pain: Secondary | ICD-10-CM | POA: Diagnosis not present

## 2014-10-30 NOTE — Op Note (Signed)
PATIENT NAME:  Kathleen Paul, Kathleen Paul MR#:  [id number] DATE OF BIRTH:  [birth year]  DATE OF PROCEDURE:  09/23/2012  PREOPERATIVE DIAGNOSIS: Right inguinal hernia.   POSTOPERATIVE DIAGNOSIS: Right inguinal hernia.   PROCEDURE PERFORMED:  Right inguinal hernia repair with large Ultrapro mesh.   SURGEON: Hervey Ard, MD  ANESTHESIA: General by LMA under Dr. Carolin Sicks, Marcaine 0.5% with 1:200,000 units of epinephrine 30 mL local infiltration, Toradol 30 mg.   ESTIMATED BLOOD LOSS: Minimal.   CLINICAL NOTE: This 79 year old woman has developed a symptomatic left inguinal hernia. She was admitted for elective repair.   DESCRIPTION OF PROCEDURE: With the patient under adequate general anesthesia, the lower abdomen was prepped with ChloraPrep and draped. A 5 cm skin line incision along the anticipated course of the inguinal canal was carried down through the skin and subcutaneous tissue with hemostasis achieved by electrocautery and 3-0 Vicryl ties. The external oblique was opened in the direction of its fibers. The round ligament was excised and discarded. A generous indirect sac which had taken over the majority of the inguinal canal was mobilized. The preperitoneal space was cleared. No femoral hernia was identified. A large Ultrafoam mesh was smoothed into the preperitoneal position and the external component laid along the floor of the inguinal canal. It was anchored to the pubic tubercle with 0 Surgilon followed by interrupted 0 Surgilon sutures along the inguinal ligament and along the transverse abdominis aponeurosis, on the medial and superior borders. The external oblique was closed with a running 2-0 Vicryl after instillation of Toradol into the wound. The layer of Scarpa's fascia was closed with a running 3-0        Vicryl and the skin closed with a running 4-0 Vicryl subcuticular suture. Benzoin, Steri-Strips, Telfa and Tegaderm dressing was then applied.   The patient tolerated the  procedure well and was taken to the recovery room in stable condition.  ____________________________ Robert Bellow, MD jwb:sb D: 09/23/2012 20:25:34 ET T: 09/24/2012 09:12:37 ET JOB#: [id number]  cc: Robert Bellow, MD, <Dictator> Marciano Sequin. Deborra Medina, MD Mikal Blasdell Amedeo Kinsman MD ELECTRONICALLY SIGNED 09/25/2012 14:52

## 2014-11-09 ENCOUNTER — Emergency Department (HOSPITAL_COMMUNITY): Payer: Medicare Other

## 2014-11-09 ENCOUNTER — Encounter (HOSPITAL_COMMUNITY): Payer: Self-pay

## 2014-11-09 ENCOUNTER — Emergency Department (HOSPITAL_COMMUNITY)
Admission: EM | Admit: 2014-11-09 | Discharge: 2014-11-10 | Disposition: A | Discharge status: Home / Self Care | Payer: Medicare Other | Attending: Emergency Medicine | Admitting: Emergency Medicine

## 2014-11-09 DIAGNOSIS — I2510 Atherosclerotic heart disease of native coronary artery without angina pectoris: Secondary | ICD-10-CM | POA: Insufficient documentation

## 2014-11-09 DIAGNOSIS — Z9889 Other specified postprocedural states: Secondary | ICD-10-CM | POA: Diagnosis not present

## 2014-11-09 DIAGNOSIS — Z79899 Other long term (current) drug therapy: Secondary | ICD-10-CM | POA: Diagnosis not present

## 2014-11-09 DIAGNOSIS — E039 Hypothyroidism, unspecified: Secondary | ICD-10-CM | POA: Diagnosis not present

## 2014-11-09 DIAGNOSIS — M79621 Pain in right upper arm: Secondary | ICD-10-CM | POA: Diagnosis not present

## 2014-11-09 DIAGNOSIS — E780 Pure hypercholesterolemia: Secondary | ICD-10-CM | POA: Insufficient documentation

## 2014-11-09 DIAGNOSIS — F801 Expressive language disorder: Secondary | ICD-10-CM | POA: Diagnosis not present

## 2014-11-09 DIAGNOSIS — I8290 Acute embolism and thrombosis of unspecified vein: Secondary | ICD-10-CM | POA: Diagnosis not present

## 2014-11-09 DIAGNOSIS — F411 Generalized anxiety disorder: Secondary | ICD-10-CM | POA: Insufficient documentation

## 2014-11-09 DIAGNOSIS — R4701 Aphasia: Secondary | ICD-10-CM | POA: Diagnosis not present

## 2014-11-09 DIAGNOSIS — Z87891 Personal history of nicotine dependence: Secondary | ICD-10-CM | POA: Diagnosis not present

## 2014-11-09 DIAGNOSIS — M7989 Other specified soft tissue disorders: Secondary | ICD-10-CM | POA: Diagnosis present

## 2014-11-09 DIAGNOSIS — M1990 Unspecified osteoarthritis, unspecified site: Secondary | ICD-10-CM | POA: Insufficient documentation

## 2014-11-09 DIAGNOSIS — Z8719 Personal history of other diseases of the digestive system: Secondary | ICD-10-CM | POA: Insufficient documentation

## 2014-11-09 DIAGNOSIS — Z7982 Long term (current) use of aspirin: Secondary | ICD-10-CM | POA: Diagnosis not present

## 2014-11-09 DIAGNOSIS — I808 Phlebitis and thrombophlebitis of other sites: Secondary | ICD-10-CM

## 2014-11-09 DIAGNOSIS — I10 Essential (primary) hypertension: Secondary | ICD-10-CM | POA: Diagnosis not present

## 2014-11-09 DIAGNOSIS — E559 Vitamin D deficiency, unspecified: Secondary | ICD-10-CM | POA: Insufficient documentation

## 2014-11-09 DIAGNOSIS — R1310 Dysphagia, unspecified: Secondary | ICD-10-CM | POA: Diagnosis not present

## 2014-11-09 DIAGNOSIS — Z791 Long term (current) use of non-steroidal anti-inflammatories (NSAID): Secondary | ICD-10-CM | POA: Insufficient documentation

## 2014-11-09 LAB — DIFFERENTIAL
Basophils Absolute: 0 10*3/uL (ref 0.0–0.1)
Basophils Relative: 1 % (ref 0–1)
EOS PCT: 5 % (ref 0–5)
Eosinophils Absolute: 0.3 10*3/uL (ref 0.0–0.7)
Lymphocytes Relative: 24 % (ref 12–46)
Lymphs Abs: 1.3 10*3/uL (ref 0.7–4.0)
Monocytes Absolute: 0.6 10*3/uL (ref 0.1–1.0)
Monocytes Relative: 10 % (ref 3–12)
Neutro Abs: 3.2 10*3/uL (ref 1.7–7.7)
Neutrophils Relative %: 60 % (ref 43–77)

## 2014-11-09 LAB — I-STAT CHEM 8, ED
BUN: 22 mg/dL — AB (ref 6–20)
CALCIUM ION: 1.11 mmol/L — AB (ref 1.13–1.30)
Chloride: 101 mmol/L (ref 101–111)
Creatinine, Ser: 1.2 mg/dL — ABNORMAL HIGH (ref 0.44–1.00)
Glucose, Bld: 78 mg/dL (ref 70–99)
HEMATOCRIT: 32 % — AB (ref 36.0–46.0)
Hemoglobin: 10.9 g/dL — ABNORMAL LOW (ref 12.0–15.0)
Potassium: 3.8 mmol/L (ref 3.5–5.1)
Sodium: 136 mmol/L (ref 135–145)
TCO2: 20 mmol/L (ref 0–100)

## 2014-11-09 LAB — CBC
HEMATOCRIT: 29.7 % — AB (ref 36.0–46.0)
HEMOGLOBIN: 9.8 g/dL — AB (ref 12.0–15.0)
MCH: 30.6 pg (ref 26.0–34.0)
MCHC: 33 g/dL (ref 30.0–36.0)
MCV: 92.8 fL (ref 78.0–100.0)
PLATELETS: 248 10*3/uL (ref 150–400)
RBC: 3.2 MIL/uL — AB (ref 3.87–5.11)
RDW: 13.8 % (ref 11.5–15.5)
WBC: 5.4 10*3/uL (ref 4.0–10.5)

## 2014-11-09 LAB — COMPREHENSIVE METABOLIC PANEL
ALT: 12 U/L — ABNORMAL LOW (ref 14–54)
AST: 21 U/L (ref 15–41)
Albumin: 3.5 g/dL (ref 3.5–5.0)
Alkaline Phosphatase: 56 U/L (ref 38–126)
Anion gap: 11 (ref 5–15)
BILIRUBIN TOTAL: 0.7 mg/dL (ref 0.3–1.2)
BUN: 21 mg/dL — AB (ref 6–20)
CO2: 23 mmol/L (ref 22–32)
Calcium: 8.7 mg/dL — ABNORMAL LOW (ref 8.9–10.3)
Chloride: 102 mmol/L (ref 101–111)
Creatinine, Ser: 1.24 mg/dL — ABNORMAL HIGH (ref 0.44–1.00)
GFR calc non Af Amer: 41 mL/min — ABNORMAL LOW (ref >60)
GFR, EST AFRICAN AMERICAN: 48 mL/min — AB (ref >60)
Glucose, Bld: 78 mg/dL (ref 70–99)
Potassium: 3.8 mmol/L (ref 3.5–5.1)
SODIUM: 136 mmol/L (ref 135–145)
TOTAL PROTEIN: 6.1 g/dL — AB (ref 6.5–8.1)

## 2014-11-09 LAB — PROTIME-INR
INR: 1.17 (ref 0.00–1.49)
Prothrombin Time: 15.1 seconds (ref 11.6–15.2)

## 2014-11-09 LAB — CBG MONITORING, ED
GLUCOSE-CAPILLARY: 57 mg/dL — AB (ref 70–99)
GLUCOSE-CAPILLARY: 135 mg/dL — AB (ref 70–99)

## 2014-11-09 LAB — I-STAT TROPONIN, ED: Troponin i, poc: 0 ng/mL (ref 0.00–0.08)

## 2014-11-09 LAB — APTT: APTT: 24 s (ref 24–37)

## 2014-11-09 MED ORDER — ASPIRIN 81 MG PO CHEW
324.0000 mg | CHEWABLE_TABLET | Freq: Once | ORAL | Status: AC
  Administered 2014-11-09: 324 mg via ORAL
  Filled 2014-11-09: qty 4

## 2014-11-09 NOTE — ED Provider Notes (Signed)
CSN: [id number]     Arrival date & time 11/09/14  1523 History   First MD Initiated Contact with Patient 11/09/14 1809     Chief Complaint  Patient presents with  . Arm Swelling   (Consider location/radiation/quality/duration/timing/severity/associated sxs/prior Treatment) Patient is a 79 y.o. female presenting with arm injury. The history is provided by the patient. No language interpreter was used.  Arm Injury Location:  Arm Injury: no   Arm location:  R upper arm Chronicity:  New Handedness:  Right-handed Dislocation: no   Foreign body present:  No foreign bodies Prior injury to area:  No Relieved by:  Nothing Worsened by:  Nothing tried Ineffective treatments:  None tried Associated symptoms: swelling   Associated symptoms: no back pain, no decreased range of motion, no fatigue, no fever, no muscle weakness, no numbness, no stiffness and no tingling     Past Medical History  Diagnosis Date  . Non-obstructive CAD     a. 04/2010 Myoview: EF 77%, fixed apical/inferior defect;  b. 04/2010 Cath: LAD 13m, LCX/RCA minor irregs, EF 60%;  c. 04/2011 Myoview: low risk.  Marland Kitchen Anxiety state, unspecified   . Pure hypercholesterolemia   . Essential hypertension, benign   . Unspecified hypothyroidism   . Arthritis   . Vitamin D deficiency   . UI (urinary incontinence)   . Inguinal hernia without mention of obstruction or gangrene, unilateral or unspecified, (not specified as recurrent)     2014  . Umbilical hernia without mention of obstruction or gangrene     2014  . History of DVT (deep vein thrombosis)     legs  . Mild mitral regurgitation     a. 04/2010 Echo: nl EF, mild diast dysfxn, trace PR, mild TR, mild-mod MR.  . Diverticulosis   . Internal hemorrhoids   . Esophageal stricture   . Scoliosis   . Atherosclerosis    Past Surgical History  Procedure Laterality Date  . Laparoscopic hysterectomy    . Tonsillectomy    . Abdominal hysterectomy  2008  . Colonoscopy  2003     [facility]  . Hernia repair Right 09/23/2012    repair [facility]  . Cardiac catheterization  2011    [facility]   Family History  Problem Relation Age of Onset  . Breast cancer Sister   . Breast cancer Maternal Aunt   . Heart disease Mother   . Heart attack Mother   . Heart disease Father   . Heart attack Father   . Heart disease Brother     sister   History  Substance Use Topics  . Smoking status: Former Smoker -- 0.50 packs/day for 30 years    Types: Cigarettes    Quit date: 07/10/1988  . Smokeless tobacco: Never Used  . Alcohol Use: No   OB History    Obstetric Comments   Age first menstruation-13 Age LMP-58,hysterectomy in 2008     Review of Systems  Constitutional: Negative for fever, diaphoresis and fatigue.  Respiratory: Negative for cough, chest tightness and shortness of breath.   Cardiovascular: Negative for chest pain, palpitations and leg swelling.  Gastrointestinal: Negative for nausea, vomiting, abdominal pain and constipation.  Musculoskeletal: Positive for myalgias (right upper arm). Negative for back pain, gait problem and stiffness.  Neurological: Positive for speech difficulty. Negative for weakness, light-headedness, numbness and headaches.  Psychiatric/Behavioral: Positive for confusion.  All other systems reviewed and are negative.     Allergies  Codeine  Home Medications   Prior  to Admission medications   Medication Sig Start Date End Date Taking? Authorizing Provider  aspirin 81 MG tablet Take 81 mg by mouth daily.     Yes Historical Provider, MD  calcium-vitamin D (OSCAL WITH D) 250-125 MG-UNIT per tablet Take 1 tablet by mouth daily.   Yes Historical Provider, MD  chlorhexidine (PERIDEX) 0.12 % solution 15 mLs by Mouth Rinse route 2 (two) times daily. 10/16/14  Yes Historical Provider, MD  clonazePAM (KLONOPIN) 0.5 MG tablet Take one by mouth twice a day. Patient taking differently: Take 0.5 mg by mouth 2 (two) times daily.  07/17/13  Yes Lucille Passy, MD  diclofenac (VOLTAREN) 75 MG EC tablet Take 1 tablet (75 mg total) by mouth 2 (two) times daily. 08/09/12  Yes Lucille Passy, MD  enalapril-hydrochlorothiazide (VASERETIC) 10-25 MG per tablet Take 0.5 tablets by mouth daily.  07/29/14  Yes Historical Provider, MD  levothyroxine (SYNTHROID, LEVOTHROID) 88 MCG tablet Take 88 mcg by mouth daily.  09/04/14  Yes Historical Provider, MD  metoprolol tartrate (LOPRESSOR) 25 MG tablet Take 25 mg by mouth daily. Every evening 07/17/13  Yes Lucille Passy, MD  ranitidine (ZANTAC) 150 MG tablet Take 1 tablet (150 mg total) by mouth 2 (two) times daily. Patient taking differently: Take 150 mg by mouth at bedtime.  10/01/14  Yes Jerene Bears, MD  simvastatin (ZOCOR) 40 MG tablet Take 1 tablet (40 mg total) by mouth at bedtime. 08/09/12  Yes Lucille Passy, MD   BP 149/60 mmHg  Pulse 61  Temp(Src) 98.1 F (36.7 C) (Oral)  Resp 18  Ht 5' 0.25" (1.53 m)  Wt 160 lb 2 oz (72.632 kg)  BMI 31.03 kg/m2  SpO2 100% Physical Exam  Constitutional: She is oriented to person, place, and time. Vital signs are normal. She appears well-developed and well-nourished. She does not appear ill. No distress.  HENT:  Head: Normocephalic and atraumatic.  Nose: Nose normal.  Mouth/Throat: Oropharynx is clear and moist. No oropharyngeal exudate.  Eyes: EOM are normal. Pupils are equal, round, and reactive to light.  Neck: Normal range of motion. Neck supple.  Cardiovascular: Normal rate, regular rhythm, normal heart sounds and intact distal pulses.   No murmur heard. Pulmonary/Chest: Effort normal and breath sounds normal. No respiratory distress. She has no wheezes. She exhibits no tenderness.  Abdominal: Soft. There is no tenderness. There is no rebound and no guarding.  Musculoskeletal: Normal range of motion. She exhibits tenderness.  Right proximal upper extremity with slight swelling compared to left, mild tenderness to palpation.  No significant erythema.  Full ROM and  nontender to bony palpation. Good distal pulses.   Lymphadenopathy:    She has no cervical adenopathy.  Neurological: She is alert and oriented to person, place, and time. No cranial nerve deficit. Coordination normal.  Normal speech, no facial droop.  Full strength and sensation throughout.    Skin: Skin is warm and dry. She is not diaphoretic.  Psychiatric: She has a normal mood and affect. Her behavior is normal. Judgment and thought content normal.  Nursing note and vitals reviewed.   ED Course  Procedures (including critical care time) Labs Review Labs Reviewed  CBC - Abnormal; Notable for the following:    RBC 3.20 (*)    Hemoglobin 9.8 (*)    HCT 29.7 (*)    All other components within normal limits  COMPREHENSIVE METABOLIC PANEL - Abnormal; Notable for the following:    BUN 21 (*)  Creatinine, Ser 1.24 (*)    Calcium 8.7 (*)    Total Protein 6.1 (*)    ALT 12 (*)    GFR calc non Af Amer 41 (*)    GFR calc Af Amer 48 (*)    All other components within normal limits  I-STAT CHEM 8, ED - Abnormal; Notable for the following:    BUN 22 (*)    Creatinine, Ser 1.20 (*)    Calcium, Ion 1.11 (*)    Hemoglobin 10.9 (*)    HCT 32.0 (*)    All other components within normal limits  CBG MONITORING, ED - Abnormal; Notable for the following:    Glucose-Capillary 57 (*)    All other components within normal limits  CBG MONITORING, ED - Abnormal; Notable for the following:    Glucose-Capillary 135 (*)    All other components within normal limits  PROTIME-INR  APTT  DIFFERENTIAL  I-STAT TROPOININ, ED   Imaging Review Ct Head Wo Contrast  11/09/2014   CLINICAL DATA:  The patient experienced an episode of dysphasia today.  EXAM: CT HEAD WITHOUT CONTRAST  TECHNIQUE: Contiguous axial images were obtained from the base of the skull through the vertex without intravenous contrast.  COMPARISON:  None.  FINDINGS: Mild cortical atrophy is seen. There is no evidence of acute intracranial  abnormality including hemorrhage, infarct, mass lesion, mass effect, midline shift or abnormal extra-axial fluid collection. No hydrocephalus or pneumocephalus. The calvarium is intact. Imaged paranasal sinuses and mastoid air cells are clear.  IMPRESSION: No acute abnormality.   Electronically Signed   By: Inge Rise M.D.   On: 11/09/2014 20:43   Mr Brain Wo Contrast  11/09/2014   CLINICAL DATA:  Initial evaluation for acute onset expressive aphasia. Now resolved.  EXAM: MRI HEAD WITHOUT CONTRAST  TECHNIQUE: Multiplanar, multiecho pulse sequences of the brain and surrounding structures were obtained without intravenous contrast.  COMPARISON:  Prior CT from earlier the same day.  FINDINGS: The CSF containing spaces are within normal limits for patient age. No focal parenchymal signal abnormality is identified. No mass lesion, midline shift, or extra-axial fluid collection. Ventricles are normal in size without evidence of hydrocephalus.  No diffusion-weighted signal abnormality is identified to suggest acute intracranial infarct. Gray-white matter differentiation is maintained. Normal flow voids are seen within the intracranial vasculature. No intracranial hemorrhage identified.  The cervicomedullary junction is normal. Scattered multilevel degenerative changes present within the upper cervical spine. Pituitary gland is within normal limits. Pituitary stalk is midline. The globes and optic nerves demonstrate a normal appearance with normal signal intensity. The  The bone marrow signal intensity is normal. Calvarium is intact. Visualized upper cervical spine is within normal limits.  Scalp soft tissues are unremarkable.  Paranasal sinuses are clear.  No mastoid effusion.  IMPRESSION: Normal brain MRI with no acute intracranial infarct or other abnormality identified.   Electronically Signed   By: Jeannine Boga M.D.   On: 11/09/2014 23:22     EKG Interpretation   Date/Time:  Monday Nov 09 2014  16:51:49 EDT Ventricular Rate:  57 PR Interval:  194 QRS Duration: 90 QT Interval:  422 QTC Calculation: 410 R Axis:   23 Text Interpretation:  Sinus bradycardia Low voltage QRS No significant  change since last tracing Confirmed by Maryan Rued  MD, Loree Fee ([id number]) on  11/09/2014 6:35:32 PM      MDM   Final diagnoses:  Thrombophlebitis of right arm  Expressive aphasia   Pt is a  79 yo F with hx of CAD, HTN, and hx of venous thrombophlebitis who presents with right arm swelling.  Has a documented hx of DVTs in her legs, but after discussion with the patient, it sounds much more like a hx of superficial clots in the past.  Never been on anticoagulation.   Now complains of right upper extremity swelling and mild throbbing pain for the past 3 days.  Mild erythema and warmth over the area yesterday but that has resolved now.  Still painful and more swollen compared to left proximal upper extremity.  Reports it feels similar to the clots in her legs before.  Has not had any recent travel, illnesses, or reasons for clot formation.   Also reports an episode this AM that sounds concerning for TIA.  Had a few minutes of expressive aphasia this AM that lasted ~ 3 minutes.  Wasn't able to speak normally to her daughter when she was on the phone with her this morning.  Other family members were with her and report that she looked like she was trying to speak but was unable to get a statement out.  She remembers being able to think normally and want to speak, but couldn't form the words.  Description of the event sounds like expressive aphasia.   She then was slightly confused and couldn't remember several family members or the neighbor's dogs names.  No previous CVA.  Symptoms have totally resolved now and she has no focal neuro deficits.    Right arm pain/cramping.  Slight swelling on exam compared to left upper arm.  Mild erythema but not red and doesn't look like cellulitis.   Went to PCP and was concerned  for clot, so sent to [facility] ED for [place] tonight.   RUE doppler US to rule out DVT was ordered after evaluation in triage, however she presented close to the vascular lab closing time so was unable to fit an [place] in tonight.  Will need to come back in the AM to get the [place].  Patient was informed that she needs to return at 0830 and will be directed to the appropriate radiology department for her ultrasound.  Ok to [employer] home tonight without anticoagulation because it physically looks like superficial phlebitis (and based on her hx of similar).    CT head sent for stroke eval.  Stoke panel sent to lab.   Trop negative.   CT head benign.   Due to risk factors, will need further TIA work up.  Discussed with Dr. Alcario Drought (hospitalist) to determine inpatient vs outpatient work up.  He suggested MRI brain wo contrast while in the ED.  Because her episode was so brief, only verbal sx, and has totally resolved.  MRI brain was also benign.  Family and pt were informed of the results and all questions were answered.  Will dc home with plan to return in the AM for RUE [place].  Discharged in stable condition.   Patient was seen with ED Attending, Dr. Dorma Russell, MD   Tori Milks, MD 11/11/14 9728  Blanchie Dessert, MD 11/12/14 2126

## 2014-11-09 NOTE — ED Notes (Signed)
Spoke with Dr. Canary Brim order given to do U venous Doppler

## 2014-11-09 NOTE — ED Notes (Signed)
CBG-57, pt given [place] sandwich and orange juice.

## 2014-11-09 NOTE — ED Notes (Signed)
Patient back from MRI.

## 2014-11-09 NOTE — ED Notes (Signed)
Pt. wassent to [place] for a possible blood clot in her rt. Arm.  Upper rt. Arm is swollen, warm to touch. +radial pulse +cns. Pt. Has a hx of blood clots.  Pt.s family also reports that her mother has an s episode of loss of words this am.  Pt. Had a memory loss of her childrens names.    Symptoms have resolved. GCS 15.  Skin is p/w/d.  No neuro deficits noted

## 2014-11-11 ENCOUNTER — Ambulatory Visit (HOSPITAL_COMMUNITY)
Admission: RE | Admit: 2014-11-11 | Discharge: 2014-11-11 | Disposition: A | Discharge status: Home / Self Care | Payer: Medicare Other | Source: Ambulatory Visit | Attending: Emergency Medicine | Admitting: Emergency Medicine

## 2014-11-11 ENCOUNTER — Other Ambulatory Visit (HOSPITAL_COMMUNITY): Payer: Self-pay | Admitting: Emergency Medicine

## 2014-11-11 DIAGNOSIS — R609 Edema, unspecified: Secondary | ICD-10-CM

## 2014-11-11 NOTE — Progress Notes (Signed)
VASCULAR LAB PRELIMINARY  PRELIMINARY  PRELIMINARY  PRELIMINARY  Right upper extremity venous Doppler completed.    Preliminary report:  There is no obvious evidence of DVT or SVT noted in the right upper extremity.   Cathlene Gardella, RVT 11/11/2014, 10:46 AM

## 2014-12-28 ENCOUNTER — Telehealth: Payer: Self-pay | Admitting: *Deleted

## 2014-12-28 NOTE — Telephone Encounter (Signed)
Pt states she has a very painful toe.  I reviewed our records and pt has not been seen in 2 years.  I left a message encouraging pt to call for an appt.

## 2014-12-31 ENCOUNTER — Ambulatory Visit (INDEPENDENT_AMBULATORY_CARE_PROVIDER_SITE_OTHER): Payer: Medicare Other | Admitting: Podiatry

## 2014-12-31 ENCOUNTER — Encounter: Payer: Self-pay | Admitting: Podiatry

## 2014-12-31 ENCOUNTER — Ambulatory Visit: Payer: Medicare Other

## 2014-12-31 VITALS — BP 143/72 | HR 60 | Resp 12

## 2014-12-31 DIAGNOSIS — M2010 Hallux valgus (acquired), unspecified foot: Secondary | ICD-10-CM

## 2014-12-31 DIAGNOSIS — L84 Corns and callosities: Secondary | ICD-10-CM | POA: Diagnosis not present

## 2014-12-31 DIAGNOSIS — M779 Enthesopathy, unspecified: Secondary | ICD-10-CM

## 2014-12-31 DIAGNOSIS — R52 Pain, unspecified: Secondary | ICD-10-CM

## 2014-12-31 MED ORDER — TRIAMCINOLONE ACETONIDE 10 MG/ML IJ SUSP
10.0000 mg | Freq: Once | INTRAMUSCULAR | Status: AC
Start: 2014-12-31 — End: 2014-12-31
  Administered 2014-12-31: 10 mg

## 2014-12-31 NOTE — Progress Notes (Signed)
   Subjective:    Patient ID: Kathleen Paul, female    DOB: 01-10-1936, 79 y.o.   MRN: [id number]  HPI PT STATED LT FOOT 3RD TOE IS SWOLLEN, AND PAINFUL FOR 3 WEEKS. THE TOE IS BEEN THE SAME BUT WHEN WALKING IT GET WORSE. TRIED TO SOAK WITH EPSOM SALT BUT NO HELP.   Review of Systems  Cardiovascular: Positive for leg swelling.  All other systems reviewed and are negative.      Objective:   Physical Exam        Assessment & Plan:

## 2015-01-02 NOTE — Progress Notes (Signed)
Subjective:     Patient ID: Kathleen Paul, female   DOB: [birth year], 79 y.o.   MRN: [id number]  HPI patient presents stating that the left third toe has been getting painful for the last few weeks and also it seems like the joint is becoming painful in her foot. Does not remember specific injury   Review of Systems  All other systems reviewed and are negative.      Objective:   Physical Exam  Constitutional: She is oriented to person, place, and time.  Cardiovascular: Intact distal pulses.   Musculoskeletal: Normal range of motion.  Neurological: She is oriented to person, place, and time.  Skin: Skin is warm.  Nursing note and vitals reviewed.  neurovascular status intact muscle strength adequate range of motion within normal limits. Patient's noted to have discomfort and fluid buildup in the third metatarsophalangeal joint left with also discomfort in the interphalangeal joint third toe left with fluid buildup noted. Patient has pain in both these areas     Assessment:     Inflammatory capsulitis third MPJ left with distal inflammation of the interphalangeal joint with no indications of systemic causes    Plan:     Reviewed both conditions and also systemic inflammation. At this time proximal nerve block was administered and I went ahead today and I injected after aspiration the third MPJ with a half cc dexamethasone Kenalog and then the interphalangeal joint with a quarter cc of dexamethasone Kenalog. Applied thick plantar padding and reappoint to recheck

## 2015-01-04 ENCOUNTER — Other Ambulatory Visit: Payer: Self-pay

## 2015-01-27 DIAGNOSIS — M7989 Other specified soft tissue disorders: Secondary | ICD-10-CM | POA: Diagnosis not present

## 2015-01-29 DIAGNOSIS — H2513 Age-related nuclear cataract, bilateral: Secondary | ICD-10-CM | POA: Diagnosis not present

## 2015-02-16 ENCOUNTER — Other Ambulatory Visit: Payer: Self-pay | Admitting: Obstetrics & Gynecology

## 2015-02-16 ENCOUNTER — Other Ambulatory Visit: Payer: Self-pay | Admitting: Dermatology

## 2015-02-16 DIAGNOSIS — L570 Actinic keratosis: Secondary | ICD-10-CM | POA: Diagnosis not present

## 2015-02-16 DIAGNOSIS — Z01419 Encounter for gynecological examination (general) (routine) without abnormal findings: Secondary | ICD-10-CM | POA: Diagnosis not present

## 2015-02-16 DIAGNOSIS — D485 Neoplasm of uncertain behavior of skin: Secondary | ICD-10-CM | POA: Diagnosis not present

## 2015-02-16 DIAGNOSIS — Z1231 Encounter for screening mammogram for malignant neoplasm of breast: Secondary | ICD-10-CM | POA: Diagnosis not present

## 2015-02-16 DIAGNOSIS — Z6829 Body mass index (BMI) 29.0-29.9, adult: Secondary | ICD-10-CM | POA: Diagnosis not present

## 2015-02-16 DIAGNOSIS — Z124 Encounter for screening for malignant neoplasm of cervix: Secondary | ICD-10-CM | POA: Diagnosis not present

## 2015-02-16 DIAGNOSIS — D2361 Other benign neoplasm of skin of right upper limb, including shoulder: Secondary | ICD-10-CM | POA: Diagnosis not present

## 2015-02-17 LAB — CYTOLOGY - PAP

## 2015-03-08 DIAGNOSIS — E559 Vitamin D deficiency, unspecified: Secondary | ICD-10-CM | POA: Diagnosis not present

## 2015-03-08 DIAGNOSIS — I10 Essential (primary) hypertension: Secondary | ICD-10-CM | POA: Diagnosis not present

## 2015-03-08 DIAGNOSIS — E780 Pure hypercholesterolemia: Secondary | ICD-10-CM | POA: Diagnosis not present

## 2015-03-08 DIAGNOSIS — I43 Cardiomyopathy in diseases classified elsewhere: Secondary | ICD-10-CM | POA: Diagnosis not present

## 2015-03-08 DIAGNOSIS — E039 Hypothyroidism, unspecified: Secondary | ICD-10-CM | POA: Diagnosis not present

## 2015-03-08 DIAGNOSIS — R5383 Other fatigue: Secondary | ICD-10-CM | POA: Diagnosis not present

## 2015-04-22 ENCOUNTER — Other Ambulatory Visit: Payer: Self-pay | Admitting: Internal Medicine

## 2015-06-14 DIAGNOSIS — M25511 Pain in right shoulder: Secondary | ICD-10-CM | POA: Diagnosis not present

## 2015-07-08 DIAGNOSIS — M25511 Pain in right shoulder: Secondary | ICD-10-CM | POA: Diagnosis not present

## 2015-07-08 DIAGNOSIS — M170 Bilateral primary osteoarthritis of knee: Secondary | ICD-10-CM | POA: Diagnosis not present

## 2015-07-08 DIAGNOSIS — M1712 Unilateral primary osteoarthritis, left knee: Secondary | ICD-10-CM | POA: Diagnosis not present

## 2015-07-08 DIAGNOSIS — M1711 Unilateral primary osteoarthritis, right knee: Secondary | ICD-10-CM | POA: Diagnosis not present

## 2015-07-20 DIAGNOSIS — M25511 Pain in right shoulder: Secondary | ICD-10-CM | POA: Diagnosis not present

## 2015-07-26 DIAGNOSIS — M7521 Bicipital tendinitis, right shoulder: Secondary | ICD-10-CM | POA: Diagnosis not present

## 2015-07-26 DIAGNOSIS — M12811 Other specific arthropathies, not elsewhere classified, right shoulder: Secondary | ICD-10-CM | POA: Diagnosis not present

## 2015-07-26 DIAGNOSIS — M170 Bilateral primary osteoarthritis of knee: Secondary | ICD-10-CM | POA: Diagnosis not present

## 2015-08-11 DIAGNOSIS — M170 Bilateral primary osteoarthritis of knee: Secondary | ICD-10-CM | POA: Diagnosis not present

## 2015-08-11 DIAGNOSIS — M1711 Unilateral primary osteoarthritis, right knee: Secondary | ICD-10-CM | POA: Diagnosis not present

## 2015-08-11 DIAGNOSIS — M1712 Unilateral primary osteoarthritis, left knee: Secondary | ICD-10-CM | POA: Diagnosis not present

## 2015-08-12 DIAGNOSIS — J069 Acute upper respiratory infection, unspecified: Secondary | ICD-10-CM | POA: Diagnosis not present

## 2015-08-18 DIAGNOSIS — M1711 Unilateral primary osteoarthritis, right knee: Secondary | ICD-10-CM | POA: Diagnosis not present

## 2015-08-18 DIAGNOSIS — M1712 Unilateral primary osteoarthritis, left knee: Secondary | ICD-10-CM | POA: Diagnosis not present

## 2015-08-18 DIAGNOSIS — M170 Bilateral primary osteoarthritis of knee: Secondary | ICD-10-CM | POA: Diagnosis not present

## 2015-08-25 DIAGNOSIS — M170 Bilateral primary osteoarthritis of knee: Secondary | ICD-10-CM | POA: Diagnosis not present

## 2015-09-29 ENCOUNTER — Encounter: Payer: Self-pay | Admitting: Podiatry

## 2015-09-29 ENCOUNTER — Ambulatory Visit (INDEPENDENT_AMBULATORY_CARE_PROVIDER_SITE_OTHER): Payer: Medicare Other | Admitting: Podiatry

## 2015-09-29 DIAGNOSIS — Q828 Other specified congenital malformations of skin: Secondary | ICD-10-CM

## 2015-09-29 DIAGNOSIS — B351 Tinea unguium: Secondary | ICD-10-CM | POA: Diagnosis not present

## 2015-09-29 DIAGNOSIS — M79676 Pain in unspecified toe(s): Secondary | ICD-10-CM

## 2015-09-29 NOTE — Progress Notes (Signed)
She presents today with a chief complaint of painful elongated toenails painful calluses and bunions. She states that her hallux left is becoming more painful as time goes on.  Objective: Vital signs are stable alert and oriented 3. Pulses are strongly palpable. Neurologic sensorium is intact per Semmes-Weinstein monofilament. Deep tendon reflexes are intact. Severe hallux abductovalgus deformities bilateral which are minimally reducible. Pulses are palpable without ulcerations. Reactive hyperkeratosis was bilateral forefoot and toenails that are thick yellow dystrophic and clinically mycotic.  Assessment: Pain in limb secondary to onychomycosis. Painful porokeratosis and bunion deformities.  Plan: Debridement of toenails 1 through 5 bilateral. Debridement of all reactive hyperkeratoses. We discussed the pathology pathology conservative versus surgical therapies regarding the bunions. At this point she would like to not have surgery.

## 2015-12-28 ENCOUNTER — Other Ambulatory Visit: Payer: Self-pay | Admitting: Dermatology

## 2015-12-28 DIAGNOSIS — L570 Actinic keratosis: Secondary | ICD-10-CM | POA: Diagnosis not present

## 2015-12-28 DIAGNOSIS — D0439 Carcinoma in situ of skin of other parts of face: Secondary | ICD-10-CM | POA: Diagnosis not present

## 2015-12-28 DIAGNOSIS — D485 Neoplasm of uncertain behavior of skin: Secondary | ICD-10-CM | POA: Diagnosis not present

## 2016-01-03 ENCOUNTER — Ambulatory Visit (INDEPENDENT_AMBULATORY_CARE_PROVIDER_SITE_OTHER): Payer: Medicare Other | Admitting: Podiatry

## 2016-01-03 ENCOUNTER — Encounter: Payer: Self-pay | Admitting: Podiatry

## 2016-01-03 DIAGNOSIS — Q828 Other specified congenital malformations of skin: Secondary | ICD-10-CM | POA: Diagnosis not present

## 2016-01-03 DIAGNOSIS — M79676 Pain in unspecified toe(s): Secondary | ICD-10-CM

## 2016-01-03 DIAGNOSIS — B351 Tinea unguium: Secondary | ICD-10-CM | POA: Diagnosis not present

## 2016-01-03 NOTE — Progress Notes (Signed)
She presents today with a chief complaint of painful elongated toenails with multiple calluses. She states that we need to trim them more frequently because they're just getting too long.  Objective: Vital signs are stable she is alert and oriented 3. Pulses are palpable. Her toenails are thick yellow dystrophic onychomycotic. Severe hallux valgus deformity with hammertoe deformities. Multiple porokeratosis plantar aspect bilateral foot fifth digit right foot.  Assessment: Pain limp secondary to mycosis pain limp secondary to porokeratosis.  Plan: Debridement of toenails and corns and calluses bilateral.

## 2016-01-26 DIAGNOSIS — Z23 Encounter for immunization: Secondary | ICD-10-CM | POA: Diagnosis not present

## 2016-01-26 DIAGNOSIS — F064 Anxiety disorder due to known physiological condition: Secondary | ICD-10-CM | POA: Diagnosis not present

## 2016-01-26 DIAGNOSIS — I43 Cardiomyopathy in diseases classified elsewhere: Secondary | ICD-10-CM | POA: Diagnosis not present

## 2016-01-26 DIAGNOSIS — E7800 Pure hypercholesterolemia, unspecified: Secondary | ICD-10-CM | POA: Diagnosis not present

## 2016-01-26 DIAGNOSIS — E559 Vitamin D deficiency, unspecified: Secondary | ICD-10-CM | POA: Diagnosis not present

## 2016-01-26 DIAGNOSIS — I10 Essential (primary) hypertension: Secondary | ICD-10-CM | POA: Diagnosis not present

## 2016-01-26 DIAGNOSIS — Z0000 Encounter for general adult medical examination without abnormal findings: Secondary | ICD-10-CM | POA: Diagnosis not present

## 2016-01-26 DIAGNOSIS — E039 Hypothyroidism, unspecified: Secondary | ICD-10-CM | POA: Diagnosis not present

## 2016-03-06 ENCOUNTER — Ambulatory Visit (INDEPENDENT_AMBULATORY_CARE_PROVIDER_SITE_OTHER): Payer: Medicare Other | Admitting: Podiatry

## 2016-03-06 ENCOUNTER — Encounter: Payer: Self-pay | Admitting: Podiatry

## 2016-03-06 DIAGNOSIS — B351 Tinea unguium: Secondary | ICD-10-CM

## 2016-03-06 DIAGNOSIS — Q828 Other specified congenital malformations of skin: Secondary | ICD-10-CM | POA: Diagnosis not present

## 2016-03-06 DIAGNOSIS — M79676 Pain in unspecified toe(s): Secondary | ICD-10-CM | POA: Diagnosis not present

## 2016-03-06 NOTE — Progress Notes (Signed)
She presents today with a chief complaint of painful elongated toenails.  Objective: Toenails are thick yellow dystrophic, mycotic and painful palpation.  Assessment: Pain limb secondary to onychomycosis 1 through 5 bilateral.  Plan: Debridement of toenails 1 through 5 bilateral.

## 2016-03-23 ENCOUNTER — Other Ambulatory Visit: Payer: Self-pay | Admitting: Dermatology

## 2016-03-23 DIAGNOSIS — D0439 Carcinoma in situ of skin of other parts of face: Secondary | ICD-10-CM | POA: Diagnosis not present

## 2016-03-23 DIAGNOSIS — C44629 Squamous cell carcinoma of skin of left upper limb, including shoulder: Secondary | ICD-10-CM | POA: Diagnosis not present

## 2016-03-28 DIAGNOSIS — M9902 Segmental and somatic dysfunction of thoracic region: Secondary | ICD-10-CM | POA: Diagnosis not present

## 2016-03-28 DIAGNOSIS — M50323 Other cervical disc degeneration at C6-C7 level: Secondary | ICD-10-CM | POA: Diagnosis not present

## 2016-03-28 DIAGNOSIS — M5384 Other specified dorsopathies, thoracic region: Secondary | ICD-10-CM | POA: Diagnosis not present

## 2016-03-28 DIAGNOSIS — M791 Myalgia: Secondary | ICD-10-CM | POA: Diagnosis not present

## 2016-03-28 DIAGNOSIS — M9901 Segmental and somatic dysfunction of cervical region: Secondary | ICD-10-CM | POA: Diagnosis not present

## 2016-04-03 DIAGNOSIS — M791 Myalgia: Secondary | ICD-10-CM | POA: Diagnosis not present

## 2016-04-03 DIAGNOSIS — M50323 Other cervical disc degeneration at C6-C7 level: Secondary | ICD-10-CM | POA: Diagnosis not present

## 2016-04-03 DIAGNOSIS — M9901 Segmental and somatic dysfunction of cervical region: Secondary | ICD-10-CM | POA: Diagnosis not present

## 2016-04-03 DIAGNOSIS — M9902 Segmental and somatic dysfunction of thoracic region: Secondary | ICD-10-CM | POA: Diagnosis not present

## 2016-04-03 DIAGNOSIS — M5384 Other specified dorsopathies, thoracic region: Secondary | ICD-10-CM | POA: Diagnosis not present

## 2016-04-05 DIAGNOSIS — N179 Acute kidney failure, unspecified: Secondary | ICD-10-CM | POA: Diagnosis not present

## 2016-04-05 DIAGNOSIS — M50323 Other cervical disc degeneration at C6-C7 level: Secondary | ICD-10-CM | POA: Diagnosis not present

## 2016-04-05 DIAGNOSIS — N183 Chronic kidney disease, stage 3 (moderate): Secondary | ICD-10-CM | POA: Diagnosis not present

## 2016-04-05 DIAGNOSIS — M791 Myalgia: Secondary | ICD-10-CM | POA: Diagnosis not present

## 2016-04-05 DIAGNOSIS — M9901 Segmental and somatic dysfunction of cervical region: Secondary | ICD-10-CM | POA: Diagnosis not present

## 2016-04-05 DIAGNOSIS — M9902 Segmental and somatic dysfunction of thoracic region: Secondary | ICD-10-CM | POA: Diagnosis not present

## 2016-04-05 DIAGNOSIS — M5384 Other specified dorsopathies, thoracic region: Secondary | ICD-10-CM | POA: Diagnosis not present

## 2016-04-05 DIAGNOSIS — N289 Disorder of kidney and ureter, unspecified: Secondary | ICD-10-CM | POA: Diagnosis not present

## 2016-04-06 DIAGNOSIS — M9902 Segmental and somatic dysfunction of thoracic region: Secondary | ICD-10-CM | POA: Diagnosis not present

## 2016-04-06 DIAGNOSIS — M9901 Segmental and somatic dysfunction of cervical region: Secondary | ICD-10-CM | POA: Diagnosis not present

## 2016-04-06 DIAGNOSIS — M5384 Other specified dorsopathies, thoracic region: Secondary | ICD-10-CM | POA: Diagnosis not present

## 2016-04-06 DIAGNOSIS — M50323 Other cervical disc degeneration at C6-C7 level: Secondary | ICD-10-CM | POA: Diagnosis not present

## 2016-04-06 DIAGNOSIS — M791 Myalgia: Secondary | ICD-10-CM | POA: Diagnosis not present

## 2016-05-08 ENCOUNTER — Ambulatory Visit (INDEPENDENT_AMBULATORY_CARE_PROVIDER_SITE_OTHER): Payer: Medicare Other | Admitting: Podiatry

## 2016-05-08 ENCOUNTER — Encounter: Payer: Self-pay | Admitting: Podiatry

## 2016-05-08 DIAGNOSIS — M79676 Pain in unspecified toe(s): Secondary | ICD-10-CM | POA: Diagnosis not present

## 2016-05-08 DIAGNOSIS — B351 Tinea unguium: Secondary | ICD-10-CM

## 2016-05-08 DIAGNOSIS — Q828 Other specified congenital malformations of skin: Secondary | ICD-10-CM

## 2016-05-08 NOTE — Progress Notes (Signed)
She presents today with chief complaint of pain toenails and calluses bilateral.  Objective: Vital signs are stable she's alert and oriented 3. Pulses are palpable. Toenails are thick yellow dystrophic onychomycotic multiple reactive hyperkeratosis plantar aspect of the bilateral foot.  Assessment: Pain and segment onychomycosis 1 through 5 bilateral porokeratosis bilateral foot.  Plan: Debrided all reactive hyperkeratosis and debrided nails 1 through 5 bilateral.

## 2016-06-06 ENCOUNTER — Ambulatory Visit: Payer: Medicare Other | Admitting: Podiatry

## 2016-07-17 ENCOUNTER — Ambulatory Visit: Payer: Medicare Other | Admitting: Podiatry

## 2016-07-31 ENCOUNTER — Ambulatory Visit (INDEPENDENT_AMBULATORY_CARE_PROVIDER_SITE_OTHER): Payer: Medicare Other | Admitting: Podiatry

## 2016-07-31 ENCOUNTER — Encounter: Payer: Self-pay | Admitting: Podiatry

## 2016-07-31 DIAGNOSIS — M79676 Pain in unspecified toe(s): Secondary | ICD-10-CM | POA: Diagnosis not present

## 2016-07-31 DIAGNOSIS — B351 Tinea unguium: Secondary | ICD-10-CM | POA: Diagnosis not present

## 2016-07-31 DIAGNOSIS — M2010 Hallux valgus (acquired), unspecified foot: Secondary | ICD-10-CM

## 2016-07-31 DIAGNOSIS — M2042 Other hammer toe(s) (acquired), left foot: Secondary | ICD-10-CM

## 2016-07-31 NOTE — Progress Notes (Signed)
Complaint:  Visit Type: Patient returns to my office for continued preventative foot care services. Complaint: Patient states" my nails have grown long and thick and become painful to walk and wear shoes"  The patient presents for preventative foot care services. No changes to ROS.  She has also developed a skin lesion on tip big toe left foot.  Painful hammer toe second toe left foot.  Podiatric Exam: Vascular: dorsalis pedis and posterior tibial pulses are palpable bilateral. Capillary return is immediate. Temperature gradient is WNL. Skin turgor WNL  Sensorium: Normal Semmes Weinstein monofilament test. Normal tactile sensation bilaterally. Nail Exam: Pt has thick disfigured discolored nails with subungual debris noted bilateral entire nail hallux through fifth toenails Ulcer Exam: There is no evidence of ulcer or pre-ulcerative changes or infection. Orthopedic Exam: Muscle tone and strength are WNL. No limitations in general ROM. No crepitus or effusions noted. Foot type and digits show no abnormalities. 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 Skin: No Porokeratosis. No infection or ulcers  Diagnosis:  Onychomycosis, , Pain in right toe, pain in left toes  Treatment & Plan Procedures and Treatment: Consent by patient was obtained for treatment procedures. The patient understood the discussion of treatment and procedures well. All questions were answered thoroughly reviewed. Debridement of mycotic and hypertrophic toenails, 1 through 5 bilateral and clearing of subungual debris. No ulceration, no infection noted. Dispense padding for ht. Return Visit-Office Procedure: Patient instructed to return to the office for a follow up visit 3 months for continued evaluation and treatment.    Gardiner Barefoot DPM

## 2016-09-05 ENCOUNTER — Encounter: Payer: Self-pay | Admitting: Primary Care

## 2016-09-05 ENCOUNTER — Ambulatory Visit (INDEPENDENT_AMBULATORY_CARE_PROVIDER_SITE_OTHER): Payer: Medicare Other | Admitting: Primary Care

## 2016-09-05 VITALS — BP 122/72 | HR 57 | Temp 97.4°F | Ht 60.0 in | Wt 144.1 lb

## 2016-09-05 DIAGNOSIS — E785 Hyperlipidemia, unspecified: Secondary | ICD-10-CM | POA: Diagnosis not present

## 2016-09-05 DIAGNOSIS — I10 Essential (primary) hypertension: Secondary | ICD-10-CM

## 2016-09-05 DIAGNOSIS — M1990 Unspecified osteoarthritis, unspecified site: Secondary | ICD-10-CM | POA: Insufficient documentation

## 2016-09-05 DIAGNOSIS — F411 Generalized anxiety disorder: Secondary | ICD-10-CM | POA: Diagnosis not present

## 2016-09-05 DIAGNOSIS — N3941 Urge incontinence: Secondary | ICD-10-CM

## 2016-09-05 DIAGNOSIS — E039 Hypothyroidism, unspecified: Secondary | ICD-10-CM

## 2016-09-05 DIAGNOSIS — I2510 Atherosclerotic heart disease of native coronary artery without angina pectoris: Secondary | ICD-10-CM

## 2016-09-05 LAB — COMPREHENSIVE METABOLIC PANEL
ALT: 17 U/L (ref 0–35)
AST: 20 U/L (ref 0–37)
Albumin: 4 g/dL (ref 3.5–5.2)
Alkaline Phosphatase: 56 U/L (ref 39–117)
BUN: 45 mg/dL — ABNORMAL HIGH (ref 6–23)
CO2: 26 meq/L (ref 19–32)
Calcium: 8.9 mg/dL (ref 8.4–10.5)
Chloride: 104 mEq/L (ref 96–112)
Creatinine, Ser: 1.48 mg/dL — ABNORMAL HIGH (ref 0.40–1.20)
GFR: 36.18 mL/min — AB (ref >60.00)
Glucose, Bld: 80 mg/dL (ref 70–99)
POTASSIUM: 4.1 meq/L (ref 3.5–5.1)
Sodium: 138 mEq/L (ref 135–145)
Total Bilirubin: 0.7 mg/dL (ref 0.2–1.2)
Total Protein: 6.5 g/dL (ref 6.0–8.3)

## 2016-09-05 LAB — LIPID PANEL
CHOL/HDL RATIO: 2
Cholesterol: 147 mg/dL (ref 0–200)
HDL: 61.4 mg/dL (ref >39.00)
LDL Cholesterol: 72 mg/dL (ref 0–99)
NONHDL: 85.81
TRIGLYCERIDES: 68 mg/dL (ref 0.0–149.0)
VLDL: 13.6 mg/dL (ref 0.0–40.0)

## 2016-09-05 LAB — TSH: TSH: 1.06 u[IU]/mL (ref 0.35–4.50)

## 2016-09-05 MED ORDER — SERTRALINE HCL 25 MG PO TABS: 25.0000 mg | ORAL_TABLET | Freq: Every day | ORAL | 1 refills | Status: DC

## 2016-09-05 NOTE — Assessment & Plan Note (Signed)
Present mostly to shoulders and knees. Managed on diclofenac and is stable. Check renal function today.

## 2016-09-05 NOTE — Progress Notes (Signed)
Pre visit review using our clinic review tool, if applicable. No additional management support is needed unless otherwise documented below in the visit note. 

## 2016-09-05 NOTE — Assessment & Plan Note (Signed)
Managed on enalapril-HCTZ and Lopressor. Bp stable today, home readings stable. Continue current regimen, BMP pending.

## 2016-09-05 NOTE — Assessment & Plan Note (Signed)
BP stable, managed on statin, no chest pain.

## 2016-09-05 NOTE — Assessment & Plan Note (Signed)
Managed solely on Clonazepam daily. Discussed that I do not support use of this medication for the treatment of GAD. GAD 7 score of 12 today. Discussed numerous options for treatment.  Will start low dose Zoloft. Patient is to take 1/2 tablet daily for 8 days, then advance to 1 full tablet thereafter. We discussed possible side effects of headache, GI upset, drowsiness, and SI/HI. If thoughts of SI/HI develop, we discussed to present to the emergency immediately. Patient verbalized understanding.   Follow up in 6 weeks.

## 2016-09-05 NOTE — Progress Notes (Signed)
Subjective:    Patient ID: Kathleen Paul, female    DOB: [birth year], 81 y.o.   MRN: [id number]  HPI  [name] is a 81 year old female who presents today to establish care and discuss the problems mentioned below. Will obtain old records. Her last physical was 6 months ago.  1) Essential Hypertension: Currently managed on enalapril-HCTZ 10-25 mg, metoprolol 25 mg BID. Her BP in the office today is stable. She checks her BP at home which runs 120's/70's-80's. She denies chest pain, visual changes, shortness of breath.   2) Hypothyroidism: Currently managed on levothyroxine 88 mcg. Her last TSH was 6 months ago and was normal. She is requesting a recheck today. Her last dose adjustment was 4 years ago.   3) Urinary Incontinence: Mostly Urge incontinence. Previously managed on oxybutynin ER 5 mg with some improvement but could not afford this medication.   4) Hyperlipidemia: Currently managed on Simvastatin 40 mg. Her last cholesterol check was 6 months ago and was stable. She denies myalgias.   5) GAD: Diagnosed 7 years ago. She is currently managed on Clonazepam once daily. She's never taken anything else for her anxiety. GAD 7 score of 12. She experiences symptoms of daily anxiety, daily worry, difficulty relaxing. Her daughter recently moved in with her which has caused a lot of stress.  6) Arthritis: Located to knees and shoulders. She is managed on diclofenac 75 mg daily with improvement.   Review of Systems  Eyes: Negative for visual disturbance.  Respiratory: Negative for shortness of breath.   Cardiovascular: Negative for chest pain.  Genitourinary:       Incontinence, urge.  Musculoskeletal: Positive for arthralgias.  Neurological: Negative for dizziness and headaches.  Psychiatric/Behavioral: Negative for sleep disturbance. The patient is nervous/anxious.        Past Medical History:  Diagnosis Date  . Anxiety state, unspecified   . Arthritis   . Atherosclerosis   .  Diverticulosis   . Esophageal stricture   . Essential hypertension, benign   . History of DVT (deep vein thrombosis)    legs  . Inguinal hernia without mention of obstruction or gangrene, unilateral or unspecified, (not specified as recurrent)    2014  . Internal hemorrhoids   . Mild mitral regurgitation    a. 04/2010 Echo: nl EF, mild diast dysfxn, trace PR, mild TR, mild-mod MR.  . Non-obstructive CAD    a. 04/2010 Myoview: EF 77%, fixed apical/inferior defect;  b. 04/2010 Cath: LAD 78m, LCX/RCA minor irregs, EF 60%;  c. 04/2011 Myoview: low risk.  . Pure hypercholesterolemia   . Scoliosis   . UI (urinary incontinence)   . Umbilical hernia without mention of obstruction or gangrene    2014  . Unspecified hypothyroidism   . Vitamin D deficiency      Social History   Social History  . Marital status: Widowed    Spouse name: N/A  . Number of children: N/A  . Years of education: N/A   Occupational History  . Not on file.   Social History Main Topics  . Smoking status: Former Smoker    Packs/day: 0.50    Years: 30.00    Types: Cigarettes    Quit date: 07/10/1988  . Smokeless tobacco: Never Used  . Alcohol use No  . Drug use: No  . Sexual activity: Not on file   Other Topics Concern  . Not on file   Social History Narrative  . No narrative on file  Past Surgical History:  Procedure Laterality Date  . ABDOMINAL HYSTERECTOMY  2008  . CARDIAC CATHETERIZATION  2011   [facility]  . COLONOSCOPY  2003   [facility]  . HERNIA REPAIR Right 09/23/2012   repair [facility]  . LAPAROSCOPIC HYSTERECTOMY    . TONSILLECTOMY      Family History  Problem Relation Age of Onset  . Breast cancer Sister   . Breast cancer Maternal Aunt   . Heart disease Mother   . Heart attack Mother   . Heart disease Father   . Heart attack Father   . Heart disease Brother     sister    Allergies  Allergen Reactions  . Codeine Nausea Only    Current Outpatient Prescriptions on File  Prior to Visit  Medication Sig Dispense Refill  . aspirin 81 MG tablet Take 81 mg by mouth daily.      . Calcium Carbonate-Vitamin D 600-200 MG-UNIT TABS Take by mouth.    . clonazePAM (KLONOPIN) 0.5 MG tablet Take one by mouth twice a day. (Patient taking differently: Take 0.5 mg by mouth 2 (two) times daily. ) 180 tablet 0  . diclofenac (VOLTAREN) 75 MG EC tablet Take 1 tablet (75 mg total) by mouth 2 (two) times daily. 120 tablet 3  . enalapril-hydrochlorothiazide (VASERETIC) 10-25 MG per tablet Take 0.5 tablets by mouth daily.     Marland Kitchen levothyroxine (SYNTHROID, LEVOTHROID) 88 MCG tablet Take 88 mcg by mouth daily.     . metoprolol tartrate (LOPRESSOR) 25 MG tablet Take 25 mg by mouth daily. Every evening    . Multiple Vitamin (ONE-A-DAY ESSENTIAL) TABS Take by mouth.    . ranitidine (ZANTAC) 150 MG tablet Take 1 tablet (150 mg total) by mouth 2 (two) times daily. (Patient taking differently: Take 150 mg by mouth at bedtime. ) 60 tablet 5  . simvastatin (ZOCOR) 40 MG tablet Take 1 tablet (40 mg total) by mouth at bedtime. 90 tablet 3   No current facility-administered medications on file prior to visit.     BP 122/72   Pulse (!) 57   Temp 97.4 F (36.3 C) (Oral)   Ht 5' (1.524 m)   Wt 144 lb 1.9 oz (65.4 kg)   SpO2 99%   BMI 28.15 kg/m    Objective:   Physical Exam  Constitutional: She appears well-nourished.  Neck: Neck supple.  Cardiovascular: Normal rate and regular rhythm.   Pulmonary/Chest: Effort normal and breath sounds normal.  Skin: Skin is warm and dry.  Psychiatric: She has a normal mood and affect.          Assessment & Plan:

## 2016-09-05 NOTE — Assessment & Plan Note (Signed)
Managed on 88 mcg for years, asymptomatic.  Check TSH today.

## 2016-09-05 NOTE — Assessment & Plan Note (Signed)
Previously managed on oxybutynin but too costly and caused dryness to mouth. Overall manages well without meds. Consider PT in the future.

## 2016-09-05 NOTE — Assessment & Plan Note (Signed)
Managed on simvastatin 40 mg. Check lipids today. No myalgias.

## 2016-09-05 NOTE — Patient Instructions (Addendum)
Start sertraline (Zoloft) 25 mg tablets for anxiety. Take 1/2 tablet daily for 6 days then advance to 1 full tablet thereafter.  I strongly discourage use of the Clonazepam. The sertraline is to be in place of the Clonazepam.  Complete lab work prior to leaving today. I will notify you of your results once received.   Please schedule a follow up visit with me in 6 weeks to re-evaluate anxiety on the new medication.   It was a pleasure to meet you today! Please don't hesitate to call me with any questions. Welcome to [facility]!

## 2016-09-12 ENCOUNTER — Other Ambulatory Visit: Payer: Self-pay | Admitting: Primary Care

## 2016-09-12 ENCOUNTER — Encounter: Payer: Self-pay | Admitting: Primary Care

## 2016-09-12 DIAGNOSIS — M1991 Primary osteoarthritis, unspecified site: Secondary | ICD-10-CM

## 2016-09-12 MED ORDER — DICLOFENAC SODIUM 1 % TD GEL
2.0000 g | Freq: Three times a day (TID) | TRANSDERMAL | 1 refills | Status: DC | PRN
Start: 2016-09-12 — End: 2016-09-19

## 2016-09-19 ENCOUNTER — Telehealth: Payer: Self-pay | Admitting: *Deleted

## 2016-09-19 ENCOUNTER — Other Ambulatory Visit: Payer: Self-pay | Admitting: Primary Care

## 2016-09-19 DIAGNOSIS — M1991 Primary osteoarthritis, unspecified site: Secondary | ICD-10-CM

## 2016-09-19 MED ORDER — DICLOFENAC SODIUM 1 % TD GEL: 2.0000 g | Freq: Three times a day (TID) | TRANSDERMAL | 1 refills | Status: DC | PRN

## 2016-09-19 NOTE — Telephone Encounter (Signed)
Received faxed refill request from [facility] Refill sent to [facility] 09-12-16 -100 G with 1 refill Last office visit 09/05/16

## 2016-09-19 NOTE — Telephone Encounter (Signed)
Noted and already addressed. Please cancel Rx for Diclofenac Gel at [facility].

## 2016-09-20 NOTE — Telephone Encounter (Signed)
Rx for diclofenac gel has been canceled at [facility]

## 2016-10-17 ENCOUNTER — Ambulatory Visit (INDEPENDENT_AMBULATORY_CARE_PROVIDER_SITE_OTHER)
Admission: RE | Admit: 2016-10-17 | Discharge: 2016-10-17 | Disposition: A | Discharge status: Home / Self Care | Payer: Medicare Other | Source: Ambulatory Visit | Attending: Primary Care | Admitting: Primary Care

## 2016-10-17 ENCOUNTER — Encounter: Payer: Self-pay | Admitting: Primary Care

## 2016-10-17 ENCOUNTER — Ambulatory Visit (INDEPENDENT_AMBULATORY_CARE_PROVIDER_SITE_OTHER): Payer: Medicare Other | Admitting: Primary Care

## 2016-10-17 VITALS — BP 120/72 | HR 56 | Temp 97.8°F | Ht 60.0 in | Wt 147.8 lb

## 2016-10-17 DIAGNOSIS — G4700 Insomnia, unspecified: Secondary | ICD-10-CM | POA: Diagnosis not present

## 2016-10-17 DIAGNOSIS — M150 Primary generalized (osteo)arthritis: Secondary | ICD-10-CM | POA: Diagnosis not present

## 2016-10-17 DIAGNOSIS — N289 Disorder of kidney and ureter, unspecified: Secondary | ICD-10-CM | POA: Diagnosis not present

## 2016-10-17 DIAGNOSIS — F411 Generalized anxiety disorder: Secondary | ICD-10-CM

## 2016-10-17 DIAGNOSIS — M8949 Other hypertrophic osteoarthropathy, multiple sites: Secondary | ICD-10-CM

## 2016-10-17 DIAGNOSIS — M159 Polyosteoarthritis, unspecified: Secondary | ICD-10-CM

## 2016-10-17 LAB — BASIC METABOLIC PANEL
BUN: 26 mg/dL — ABNORMAL HIGH (ref 6–23)
CO2: 27 mEq/L (ref 19–32)
Calcium: 9.6 mg/dL (ref 8.4–10.5)
Chloride: 98 mEq/L (ref 96–112)
Creatinine, Ser: 1.48 mg/dL — ABNORMAL HIGH (ref 0.40–1.20)
GFR: 36.17 mL/min — AB (ref >60.00)
Glucose, Bld: 97 mg/dL (ref 70–99)
Potassium: 4.2 mEq/L (ref 3.5–5.1)
SODIUM: 134 meq/L — AB (ref 135–145)

## 2016-10-17 MED ORDER — CLONAZEPAM 0.5 MG PO TABS: ORAL_TABLET | ORAL | 0 refills | Status: DC

## 2016-10-17 NOTE — Progress Notes (Signed)
Subjective:    Patient ID: Kathleen Paul, female    DOB: 01/04/1936, 81 y.o.   MRN: [id number]  HPI  Kathleen Paul is a 81 year old female who presents today for follow up of GAD.  1) GAD: She presented as a new patient in late February managed solely on Clonazepam daily for anxiety disorder. She was initiated on low dose Zoloft for anxiety disorder and encouraged to refrain from use of Clonazepam.   Since her last visit she stopped taking Zoloft as she experienced nightmares, had difficulty sleeping, felt depressed. She would like to resume her Clonazepam as she took it once at bedtime. She sometimes would skip a dose if she didn't require use. She denies falls, drowsiness on this medication. She resumed her Clonazepam recently.  2) Osteoarthritis: Chronic and located to knees, hips, shoulders, neck. Previously taking Diclofenac tablets daily for years. This was switched to tylenol arthritis last visit due to decreased renal function on BMP. Since her last visit her arthritis has become worse on tylenol and without her Diclofenac.  She injured her left shoulder as she was closing a back window on her SUV. This occurred 6 months ago but her pain persists. Her pain is located to the left scapula and neck. She's also experiencing numbness/tingling most days to her neck and left upper extremity. She's been taking Diclofenac tablets every other day for the past 4 weeks with some improvement. She's not used the Diclofenac Gel as she was afraid of the potential side effects.   Review of Systems  Constitutional: Negative for fatigue.  Respiratory: Negative for shortness of breath.   Cardiovascular: Negative for chest pain.  Musculoskeletal: Positive for arthralgias and neck pain.  Neurological: Positive for numbness.  Psychiatric/Behavioral: Negative for sleep disturbance and suicidal ideas.       Past Medical History:  Diagnosis Date  . Anxiety state, unspecified   . Arthritis   .  Atherosclerosis   . Diverticulosis   . Esophageal stricture   . Essential hypertension, benign   . History of DVT (deep vein thrombosis)    legs  . Inguinal hernia without mention of obstruction or gangrene, unilateral or unspecified, (not specified as recurrent)    2014  . Internal hemorrhoids   . Mild mitral regurgitation    a. 04/2010 Echo: nl EF, mild diast dysfxn, trace PR, mild TR, mild-mod MR.  . Non-obstructive CAD    a. 04/2010 Myoview: EF 77%, fixed apical/inferior defect;  b. 04/2010 Cath: LAD 62m, LCX/RCA minor irregs, EF 60%;  c. 04/2011 Myoview: low risk.  . Pure hypercholesterolemia   . Scoliosis   . UI (urinary incontinence)   . Umbilical hernia without mention of obstruction or gangrene    2014  . Unspecified hypothyroidism   . Vitamin D deficiency      Social History   Social History  . Marital status: Widowed    Spouse name: N/A  . Number of children: N/A  . Years of education: N/A   Occupational History  . Not on file.   Social History Main Topics  . Smoking status: Former Smoker    Packs/day: 0.50    Years: 30.00    Types: Cigarettes    Quit date: 07/10/1988  . Smokeless tobacco: Never Used  . Alcohol use No  . Drug use: No  . Sexual activity: Not on file   Other Topics Concern  . Not on file   Social History Narrative  . No narrative on  file    Past Surgical History:  Procedure Laterality Date  . ABDOMINAL HYSTERECTOMY  2008  . CARDIAC CATHETERIZATION  2011   [facility]  . COLONOSCOPY  2003   [facility]  . HERNIA REPAIR Right 09/23/2012   repair [facility]  . LAPAROSCOPIC HYSTERECTOMY    . TONSILLECTOMY      Family History  Problem Relation Age of Onset  . Breast cancer Sister   . Breast cancer Maternal Aunt   . Heart disease Mother   . Heart attack Mother   . Heart disease Father   . Heart attack Father   . Heart disease Brother     sister    Allergies  Allergen Reactions  . Codeine Nausea Only    Current Outpatient  Prescriptions on File Prior to Visit  Medication Sig Dispense Refill  . aspirin 81 MG tablet Take 81 mg by mouth daily.      . Calcium Carbonate-Vitamin D 600-200 MG-UNIT TABS Take by mouth.    . diclofenac (VOLTAREN) 75 MG EC tablet Take 1 tablet (75 mg total) by mouth 2 (two) times daily. (Patient taking differently: Take 75 mg by mouth every other day. ) 120 tablet 3  . diclofenac sodium (VOLTAREN) 1 % GEL Apply 2 g topically 3 (three) times daily as needed (pain). 100 g 1  . enalapril-hydrochlorothiazide (VASERETIC) 10-25 MG per tablet Take 0.5 tablets by mouth daily.     Marland Kitchen levothyroxine (SYNTHROID, LEVOTHROID) 88 MCG tablet Take 88 mcg by mouth daily.     . metoprolol tartrate (LOPRESSOR) 25 MG tablet Take 25 mg by mouth daily. Every evening    . Multiple Vitamin (ONE-A-DAY ESSENTIAL) TABS Take by mouth.    . ranitidine (ZANTAC) 150 MG tablet Take 1 tablet (150 mg total) by mouth 2 (two) times daily. (Patient taking differently: Take 150 mg by mouth at bedtime. ) 60 tablet 5  . simvastatin (ZOCOR) 40 MG tablet Take 1 tablet (40 mg total) by mouth at bedtime. 90 tablet 3  . sertraline (ZOLOFT) 25 MG tablet Take 1 tablet (25 mg total) by mouth daily. (Patient not taking: Reported on 10/17/2016) 30 tablet 1   No current facility-administered medications on file prior to visit.     BP 120/72   Pulse (!) 56   Temp 97.8 F (36.6 C) (Oral)   Ht 5' (1.524 m)   Wt 147 lb 12.8 oz (67 kg)   BMI 28.87 kg/m    Objective:   Physical Exam  Constitutional: She appears well-nourished.  Neck: Neck supple.  Cardiovascular: Normal rate and regular rhythm.   Pulmonary/Chest: Effort normal and breath sounds normal.  Musculoskeletal:       Left shoulder: She exhibits normal range of motion, no tenderness and no pain.       Cervical back: She exhibits pain. She exhibits normal range of motion and no tenderness.  Skin: Skin is warm and dry.  Psychiatric: She has a normal mood and affect.           Assessment & Plan:

## 2016-10-17 NOTE — Assessment & Plan Note (Signed)
Worse without diclofenac. Will check renal function today. Discussed use of the Diclofenac Gel instead. Consider muscle relaxer. Consider PT. Discussed that she must become more active as a sedentary lifestyle will increase pain and stiffness. Check neck xray today given symptoms x 6 months.

## 2016-10-17 NOTE — Patient Instructions (Signed)
Use the Clonazepam sparingly as discussed as needed at bedtime.  Complete contract, urine drug screen, and lab work and xray prior to leaving today.   Consider physical therapy for arthritic pain.   I will be in touch regarding our next step once I receive your kidney level.  It was a pleasure to see you today!

## 2016-10-17 NOTE — Progress Notes (Signed)
Pre visit review using our clinic review tool, if applicable. No additional management support is needed unless otherwise documented below in the visit note. 

## 2016-10-17 NOTE — Assessment & Plan Note (Signed)
Experienced extreme side effects on Zoloft. Will resume Clonazepam as she agrees to use this only as needed for insomnia/anxiety at bedtime. Discussed potential long term effects of this medication, she verbalized understanding. UDS and contract obtained today. Refill provided, will closely monitor given age.

## 2016-10-18 ENCOUNTER — Encounter: Payer: Self-pay | Admitting: Primary Care

## 2016-10-30 ENCOUNTER — Ambulatory Visit (INDEPENDENT_AMBULATORY_CARE_PROVIDER_SITE_OTHER): Payer: Medicare Other | Admitting: Podiatry

## 2016-10-30 DIAGNOSIS — M79676 Pain in unspecified toe(s): Secondary | ICD-10-CM | POA: Diagnosis not present

## 2016-10-30 DIAGNOSIS — L84 Corns and callosities: Secondary | ICD-10-CM

## 2016-10-30 DIAGNOSIS — B351 Tinea unguium: Secondary | ICD-10-CM

## 2016-10-30 DIAGNOSIS — M2010 Hallux valgus (acquired), unspecified foot: Secondary | ICD-10-CM

## 2016-10-30 NOTE — Progress Notes (Signed)
Complaint:  Visit Type: Patient returns to my office for continued preventative foot care services. Complaint: Patient states" my nails have grown long and thick and become painful to walk and wear shoes"  The patient presents for preventative foot care services. No changes to ROS.  She has also developed a skin lesion on tip big toe left foot.  Painful hammer toe second toe left foot.  Podiatric Exam: Vascular: dorsalis pedis and posterior tibial pulses are palpable bilateral. Capillary return is immediate. Temperature gradient is WNL. Skin turgor WNL  Sensorium: Normal Semmes Weinstein monofilament test. Normal tactile sensation bilaterally. Nail Exam: Pt has thick disfigured discolored nails with subungual debris noted bilateral entire nail hallux through fifth toenails Ulcer Exam: There is no evidence of ulcer or pre-ulcerative changes or infection. Orthopedic Exam: Muscle tone and strength are WNL. No limitations in general ROM. No crepitus or effusions noted. Foot type and digits show no abnormalities. 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 Skin: No Porokeratosis. No infection or ulcers.Corn 5th toe right foot.  Diagnosis:  Onychomycosis, , Pain in right toe, pain in left toes  Treatment & Plan Procedures and Treatment: Consent by patient was obtained for treatment procedures. The patient understood the discussion of treatment and procedures well. All questions were answered thoroughly reviewed. Debridement of mycotic and hypertrophic toenails, 1 through 5 bilateral and clearing of subungual debris. No ulceration, no infection noted. Dispense padding for ht. Return Visit-Office Procedure: Patient instructed to return to the office for a follow up visit 3 months for continued evaluation and treatment.    Gardiner Barefoot DPM

## 2016-12-19 ENCOUNTER — Encounter: Payer: Self-pay | Admitting: Primary Care

## 2016-12-19 MED ORDER — LEVOTHYROXINE SODIUM 88 MCG PO TABS: 88.0000 ug | ORAL_TABLET | Freq: Every day | ORAL | 2 refills | Status: DC

## 2016-12-19 NOTE — Telephone Encounter (Signed)
Pt left v/m requesting cb at [phone]. that thyroid med is [facility]. Last seen and last thyroid labs 09/05/16. Refilled per protocol and pt voiced understanding. Pt will cb for appt in 08/2017.

## 2017-01-29 ENCOUNTER — Ambulatory Visit (INDEPENDENT_AMBULATORY_CARE_PROVIDER_SITE_OTHER): Payer: Medicare Other | Admitting: Podiatry

## 2017-01-29 DIAGNOSIS — M79676 Pain in unspecified toe(s): Secondary | ICD-10-CM | POA: Diagnosis not present

## 2017-01-29 DIAGNOSIS — B351 Tinea unguium: Secondary | ICD-10-CM | POA: Diagnosis not present

## 2017-01-29 DIAGNOSIS — M2010 Hallux valgus (acquired), unspecified foot: Secondary | ICD-10-CM

## 2017-01-29 NOTE — Progress Notes (Signed)
Complaint:  Visit Type: Patient returns to my office for continued preventative foot care services. Complaint: Patient states" my nails have grown long and thick and become painful to walk and wear shoes"  The patient presents for preventative foot care services. No changes to ROS.    Podiatric Exam: Vascular: dorsalis pedis and posterior tibial pulses are palpable bilateral. Capillary return is immediate. Temperature gradient is WNL. Skin turgor WNL  Sensorium: Normal Semmes Weinstein monofilament test. Normal tactile sensation bilaterally. Nail Exam: Pt has thick disfigured discolored nails with subungual debris noted bilateral entire nail hallux through fifth toenails Ulcer Exam: There is no evidence of ulcer or pre-ulcerative changes or infection. Orthopedic Exam: Muscle tone and strength are WNL. No limitations in general ROM. No crepitus or effusions noted. Foot type and digits show no abnormalities. 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 Skin: No Porokeratosis. No infection or ulcers.    Diagnosis:  Onychomycosis, , Pain in right toe, pain in left toes  Treatment & Plan Procedures and Treatment: Consent by patient was obtained for treatment procedures. The patient understood the discussion of treatment and procedures well. All questions were answered thoroughly reviewed. Debridement of mycotic and hypertrophic toenails, 1 through 5 bilateral and clearing of subungual debris. No ulceration, no infection noted. Return Visit-Office Procedure: Patient instructed to return to the office for a follow up visit 3 months for continued evaluation and treatment.    Berkeley Veldman DPM 

## 2017-02-13 ENCOUNTER — Encounter: Payer: Self-pay | Admitting: Primary Care

## 2017-02-13 DIAGNOSIS — I2510 Atherosclerotic heart disease of native coronary artery without angina pectoris: Secondary | ICD-10-CM

## 2017-02-22 ENCOUNTER — Encounter: Payer: Self-pay | Admitting: Primary Care

## 2017-03-07 ENCOUNTER — Other Ambulatory Visit: Payer: Self-pay | Admitting: Primary Care

## 2017-03-07 ENCOUNTER — Encounter: Payer: Self-pay | Admitting: Primary Care

## 2017-03-07 DIAGNOSIS — G4700 Insomnia, unspecified: Secondary | ICD-10-CM

## 2017-03-07 MED ORDER — CLONAZEPAM 0.5 MG PO TABS: ORAL_TABLET | ORAL | 0 refills | Status: DC

## 2017-03-07 NOTE — Telephone Encounter (Signed)
There is a refill request this medication. I have already sent it to you.

## 2017-03-07 NOTE — Telephone Encounter (Signed)
See my chart message. Send clonazepam to [facility].

## 2017-03-07 NOTE — Telephone Encounter (Signed)
Okay to phone in. Clonazepam 0.5 mg. Take one half to one tablet by mouth at bedtime as needed for sleep. #30 no refills. Ensure UDS and contract up-to-date.

## 2017-03-07 NOTE — Telephone Encounter (Signed)
Called in medication to the pharmacy as instructed.  Patient is up to date on UDS until 04/18/2017

## 2017-03-07 NOTE — Telephone Encounter (Signed)
Duplicate Request.

## 2017-03-07 NOTE — Telephone Encounter (Signed)
Ok to refill? Electronically refill request for clonazepam (KLONOPIN) 0.5 MG tablet -- Last prescribed and seen on 10/17/2016.

## 2017-03-19 ENCOUNTER — Ambulatory Visit (INDEPENDENT_AMBULATORY_CARE_PROVIDER_SITE_OTHER): Payer: Medicare Other | Admitting: Internal Medicine

## 2017-03-19 ENCOUNTER — Encounter: Payer: Self-pay | Admitting: Internal Medicine

## 2017-03-19 VITALS — BP 126/60 | HR 54 | Ht 60.0 in | Wt 146.0 lb

## 2017-03-19 DIAGNOSIS — I2510 Atherosclerotic heart disease of native coronary artery without angina pectoris: Secondary | ICD-10-CM | POA: Diagnosis not present

## 2017-03-19 DIAGNOSIS — R002 Palpitations: Secondary | ICD-10-CM

## 2017-03-19 DIAGNOSIS — I10 Essential (primary) hypertension: Secondary | ICD-10-CM | POA: Diagnosis not present

## 2017-03-19 NOTE — Patient Instructions (Signed)
Your physician recommends that you continue on your current medications as directed. Please refer to the Current Medication list given to you today.  Your physician has recommended that you wear an event monitor. Event monitors are medical devices that record the heart's electrical activity. Doctors most often [place] these monitors to diagnose arrhythmias. Arrhythmias are problems with the speed or rhythm of the heartbeat. The monitor is a small, portable device. You can wear one while you do your normal daily activities. This is usually used to diagnose what is causing palpitations/syncope (passing out).  Your physician wants you to follow-up in: 6 months with Dr. Harrington Challenger.  You will receive a reminder letter in the mail two months in advance. If you don't receive a letter, please call our office to schedule the follow-up appointment.

## 2017-03-19 NOTE — Progress Notes (Signed)
Cardiology Office Note   Date:  03/19/2017   ID:  ZYLAH ELSBERND, DOB 1936-03-02, MRN [id number]  PCP:  [name], NP  Cardiologist:   Dorris Carnes, MD       History of Present Illness: Kathleen Paul is a 81 y.o. female with a history of mild CAD by cath in 2011 (50% mid LAD)HTN , palpitations, fatigue, low blood pressureand LE edema   I saw her back in 2012  Since she has been seen by Johnny Bridge and then Angelica Ran  Last in 2015  The pt presents for continued f/u  She is followed by Wilmer Floor  She denies CP  Does say that she is fatigued but says she attrib to not being physically active.Hurt knee last year   Has not getten back into routine  Does have episodes of palpitations  Not associated  with dizziness  No CP  No SOB         Current Meds  Medication Sig  . amoxicillin (AMOXIL) 500 MG capsule Take 500 mg by mouth 4 (four) times daily.   Marland Kitchen aspirin 81 MG tablet Take 81 mg by mouth daily.    . Biotin 1000 MCG tablet Take 1,000 mcg by mouth daily.  . Calcium Carbonate-Vitamin D 600-200 MG-UNIT TABS Take by mouth.  . clonazePAM (KLONOPIN) 0.5 MG tablet Take 1/2-1 tablet by mouth at bedtime as needed for sleep.  Marland Kitchen diclofenac (VOLTAREN) 75 MG EC tablet Take 1 tablet (75 mg total) by mouth 2 (two) times daily. (Patient taking differently: Take 75 mg by mouth every other day. )  . diclofenac sodium (VOLTAREN) 1 % GEL Apply 2 g topically 3 (three) times daily as needed (pain).  . enalapril-hydrochlorothiazide (VASERETIC) 10-25 MG per tablet Take 0.5 tablets by mouth daily.   Marland Kitchen levothyroxine (SYNTHROID, LEVOTHROID) 88 MCG tablet Take 1 tablet (88 mcg total) by mouth daily.  . metoprolol tartrate (LOPRESSOR) 25 MG tablet Take 25 mg by mouth daily. Every evening  . Multiple Vitamin (ONE-A-DAY ESSENTIAL) TABS Take by mouth.  . ranitidine (ZANTAC) 150 MG tablet Take 1 tablet (150 mg total) by mouth 2 (two) times daily. (Patient taking differently: Take 150 mg by mouth at bedtime. )   . simvastatin (ZOCOR) 40 MG tablet Take 1 tablet (40 mg total) by mouth at bedtime.     Allergies:   Codeine   Past Medical History:  Diagnosis Date  . Anxiety state, unspecified   . Arthritis   . Atherosclerosis   . Diverticulosis   . Esophageal stricture   . Essential hypertension, benign   . History of DVT (deep vein thrombosis)    legs  . Inguinal hernia without mention of obstruction or gangrene, unilateral or unspecified, (not specified as recurrent)    2014  . Internal hemorrhoids   . Mild mitral regurgitation    a. 04/2010 Echo: nl EF, mild diast dysfxn, trace PR, mild TR, mild-mod MR.  . Non-obstructive CAD    a. 04/2010 Myoview: EF 77%, fixed apical/inferior defect;  b. 04/2010 Cath: LAD 13m, LCX/RCA minor irregs, EF 60%;  c. 04/2011 Myoview: low risk.  . Pure hypercholesterolemia   . Scoliosis   . UI (urinary incontinence)   . Umbilical hernia without mention of obstruction or gangrene    2014  . Unspecified hypothyroidism   . Vitamin D deficiency     Past Surgical History:  Procedure Laterality Date  . ABDOMINAL HYSTERECTOMY  2008  . CARDIAC CATHETERIZATION  2011   [facility]  . COLONOSCOPY  2003   [facility]  . HERNIA REPAIR Right 09/23/2012   repair [facility]  . LAPAROSCOPIC HYSTERECTOMY    . TONSILLECTOMY       Social History:  The patient  reports that she quit smoking about 28 years ago. Her smoking use included Cigarettes. She has a 15.00 pack-year smoking history. She has never used smokeless tobacco. She reports that she does not drink alcohol or use drugs.   Family History:  The patient's family history includes Breast cancer in her maternal aunt and sister; Heart attack in her father and mother; Heart disease in her brother, father, and mother.    ROS:  Please see the history of present illness. All other systems are reviewed and  Negative to the above problem except as noted.    PHYSICAL EXAM: VS:  BP 126/60   Pulse (!) 54   Ht 5' (1.524 m)    Wt 146 lb (66.2 kg)   BMI 28.51 kg/m   GEN: Well nourished, well developed, in no acute distress  HEENT: normal  Neck: no JVD, carotid bruits, or masses Cardiac: RRR; no murmurs, rubs, or gallops,no edema  Respiratory:  clear to auscultation bilaterally, normal work of breathing GI: soft, nontender, nondistended, + BS  No hepatomegaly  MS: no deformity Moving all extremities   Skin: warm and dry, no rash Neuro:  Strength and sensation are intact Psych: euthymic mood, full affect   EKG:  EKG is ordered today.  SB 54 bpm  First degree AV block  PR 202 msec  RBBB   Lipid Panel    Component Value Date/Time   CHOL 147 09/05/2016 1043   TRIG 68.0 09/05/2016 1043   HDL 61.40 09/05/2016 1043   CHOLHDL 2 09/05/2016 1043   VLDL 13.6 09/05/2016 1043   LDLCALC 72 09/05/2016 1043   LDLDIRECT 180.2 07/17/2013 0828      Wt Readings from Last 3 Encounters:  03/19/17 146 lb (66.2 kg)  10/17/16 147 lb 12.8 oz (67 kg)  09/05/16 144 lb 1.9 oz (65.4 kg)      ASSESSMENT AND PLAN:  1  Papitations  Will set up with an event monitor  2  Fatigue  I am not convinced this is an anginal equiv  I would recomm she get into a regular activity schedule      3  HTN  BP OK  4  HL   LDL was 72 in Feb   Continue meds    F/U based on test results  Will tent sched for 6 months    Current medicines are reviewed at length with the patient today.  The patient does not have concerns regarding medicines.  Signed, Dorris Carnes, MD  03/19/2017 8:32 PM    [address], [place], [place]  [address] Phone: [phone]; Fax: [phone]   ++

## 2017-03-22 ENCOUNTER — Encounter: Payer: Self-pay | Admitting: Primary Care

## 2017-03-23 MED ORDER — RANITIDINE HCL 150 MG PO TABS: 150.0000 mg | ORAL_TABLET | Freq: Every day | ORAL | 1 refills | Status: DC

## 2017-03-23 NOTE — Telephone Encounter (Signed)
Pt wanted to ck on status of ranitidine refill to [facility]; advised pt done. Pt voiced understanding.

## 2017-03-27 NOTE — Addendum Note (Signed)
Addended by: Patterson Hammersmith A on: 03/27/2017 04:37 PM   Modules accepted: Orders

## 2017-04-05 ENCOUNTER — Ambulatory Visit (INDEPENDENT_AMBULATORY_CARE_PROVIDER_SITE_OTHER): Payer: Medicare Other

## 2017-04-05 DIAGNOSIS — R002 Palpitations: Secondary | ICD-10-CM

## 2017-04-11 ENCOUNTER — Other Ambulatory Visit: Payer: Self-pay | Admitting: Primary Care

## 2017-04-11 DIAGNOSIS — G4700 Insomnia, unspecified: Secondary | ICD-10-CM

## 2017-04-11 MED ORDER — CLONAZEPAM 0.5 MG PO TABS: ORAL_TABLET | ORAL | 0 refills | Status: DC

## 2017-04-11 NOTE — Telephone Encounter (Signed)
Okay to phone in, #30 no refills.

## 2017-04-11 NOTE — Telephone Encounter (Signed)
Called in medication to the pharmacy as instructed. 

## 2017-04-11 NOTE — Telephone Encounter (Signed)
Ok to refill? Electronically refill request for clonazePAM (KLONOPIN) 0.5 MG tablet  Last prescribed on 03/07/2017. Last seen on 10/17/2016

## 2017-04-14 IMAGING — DX DG CERVICAL SPINE COMPLETE 4+V
6 series · 6 of 6 positions shown · non-contrast
Comparison: No comparison cervical spine exam. Prior brain MR
11/09/2014.

CLINICAL DATA: 80-year-old female with chronic neck pain for months
extending down left arm. Injured left shoulder 6 months ago. Initial
encounter.

EXAM:
CERVICAL SPINE - COMPLETE 4+ VIEW

[c-spine lat]
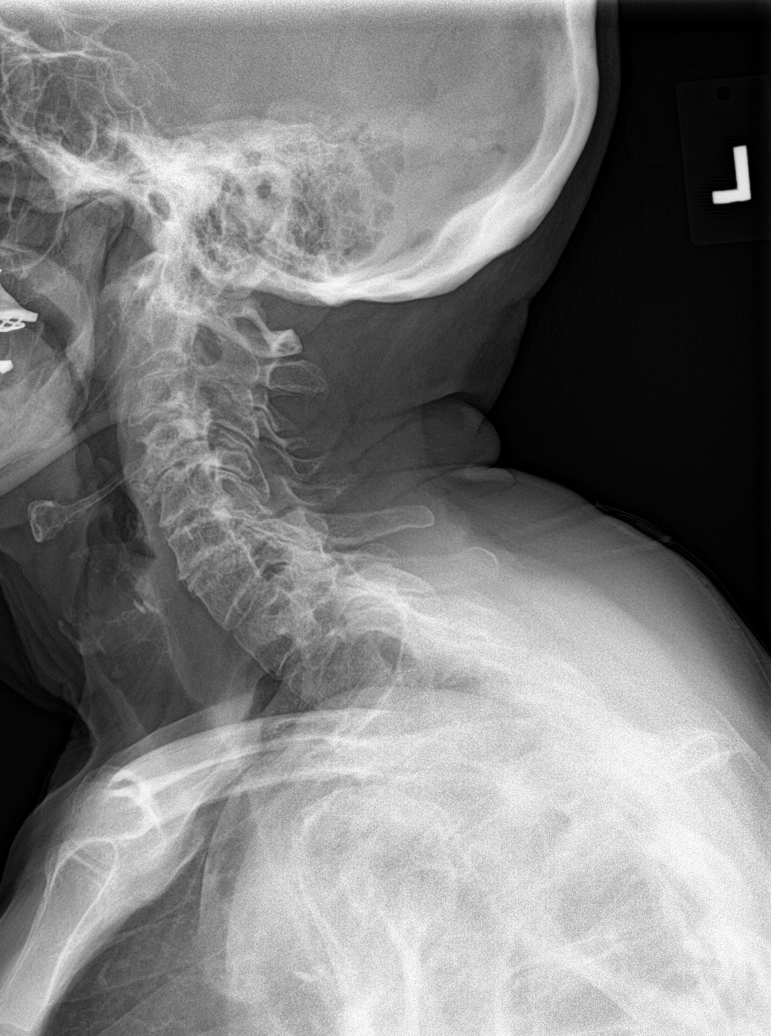

[c-spine obl (1 of 2)]
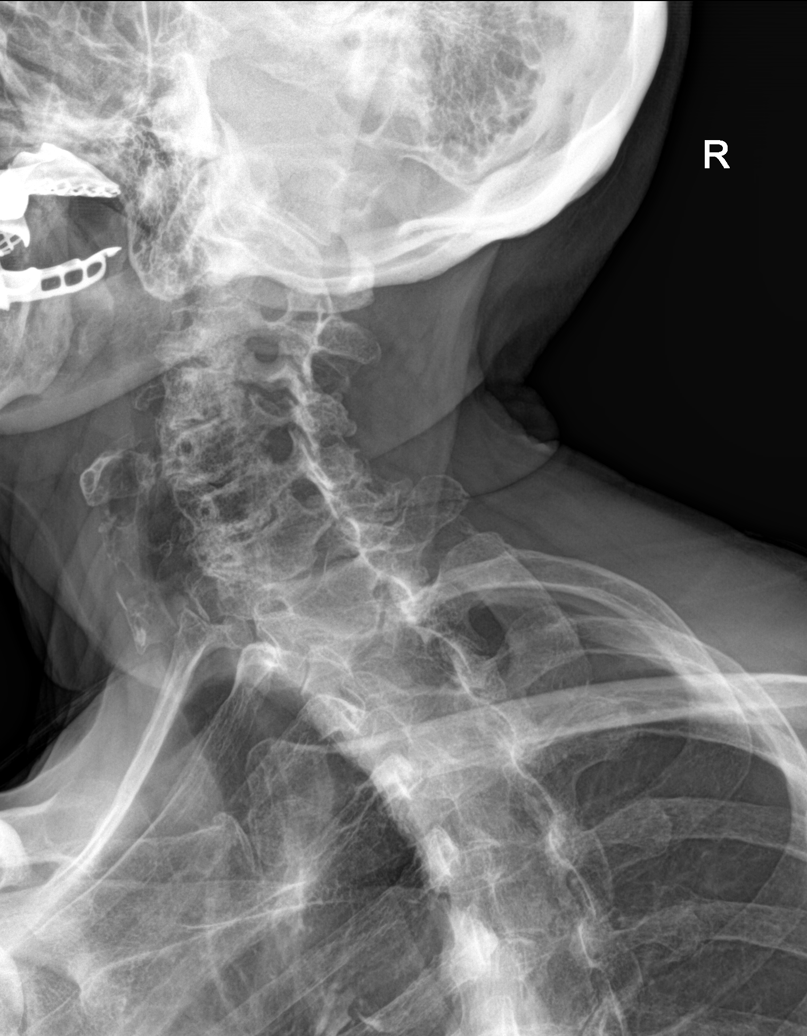

[c-spine obl (2 of 2)]
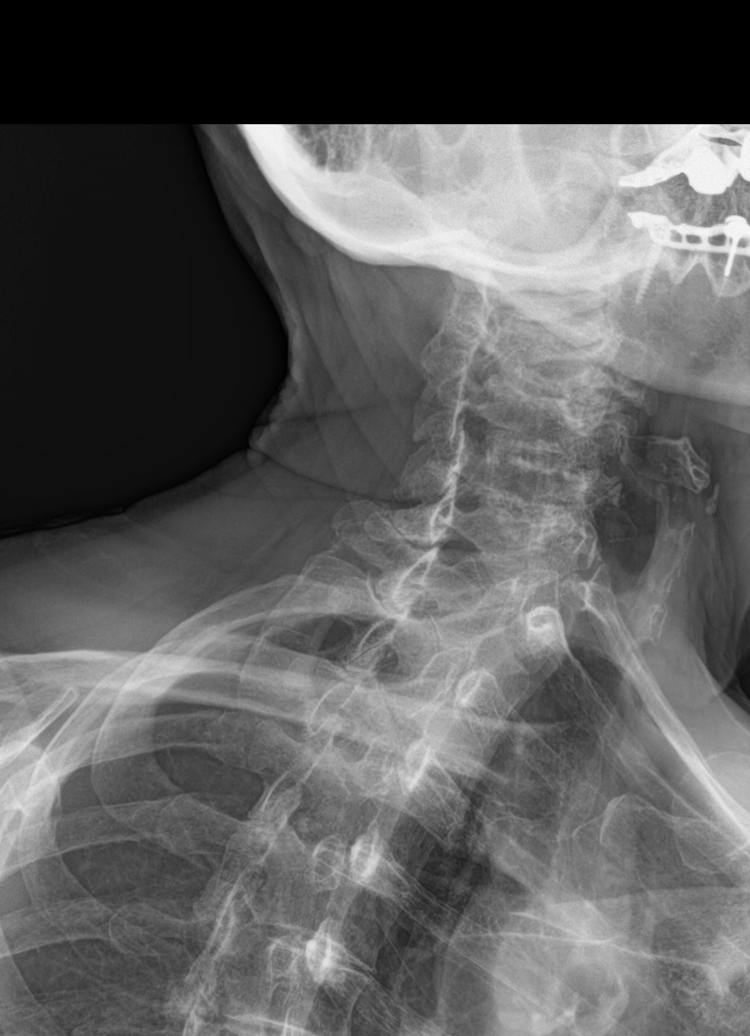

[c-spine ap]
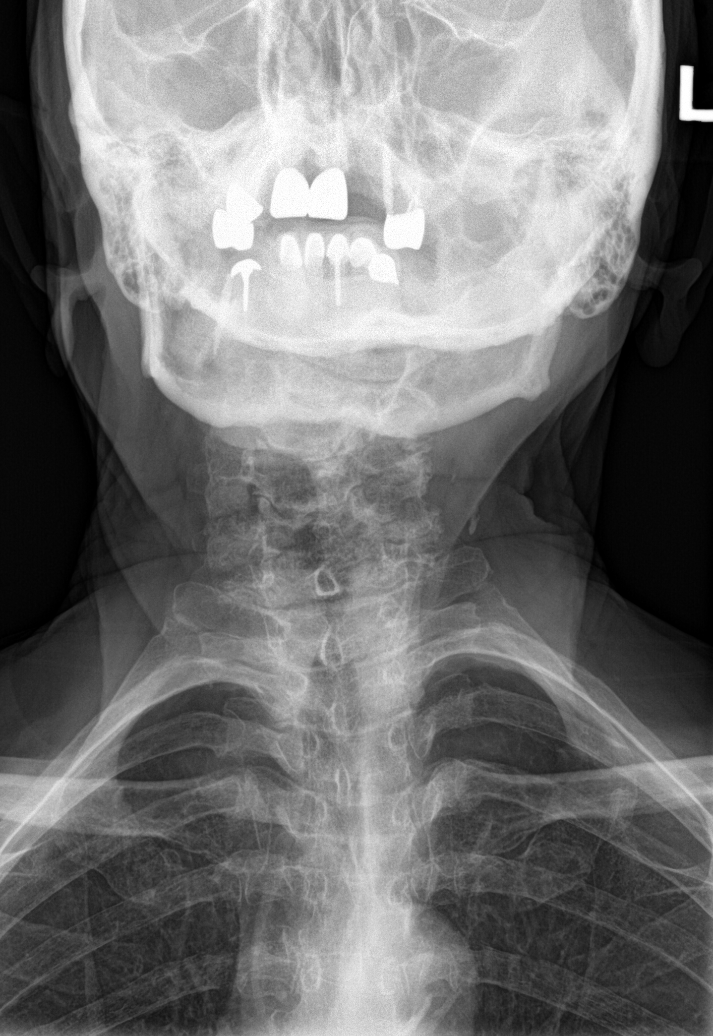

[c-spine open mouth]
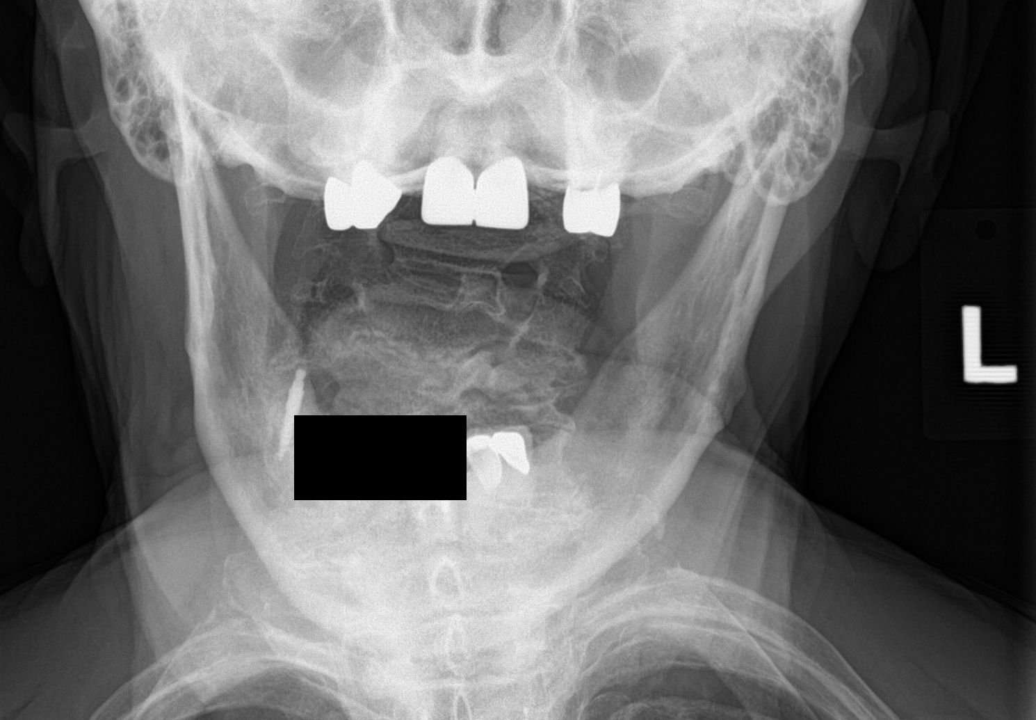

[c-spine swimmers]
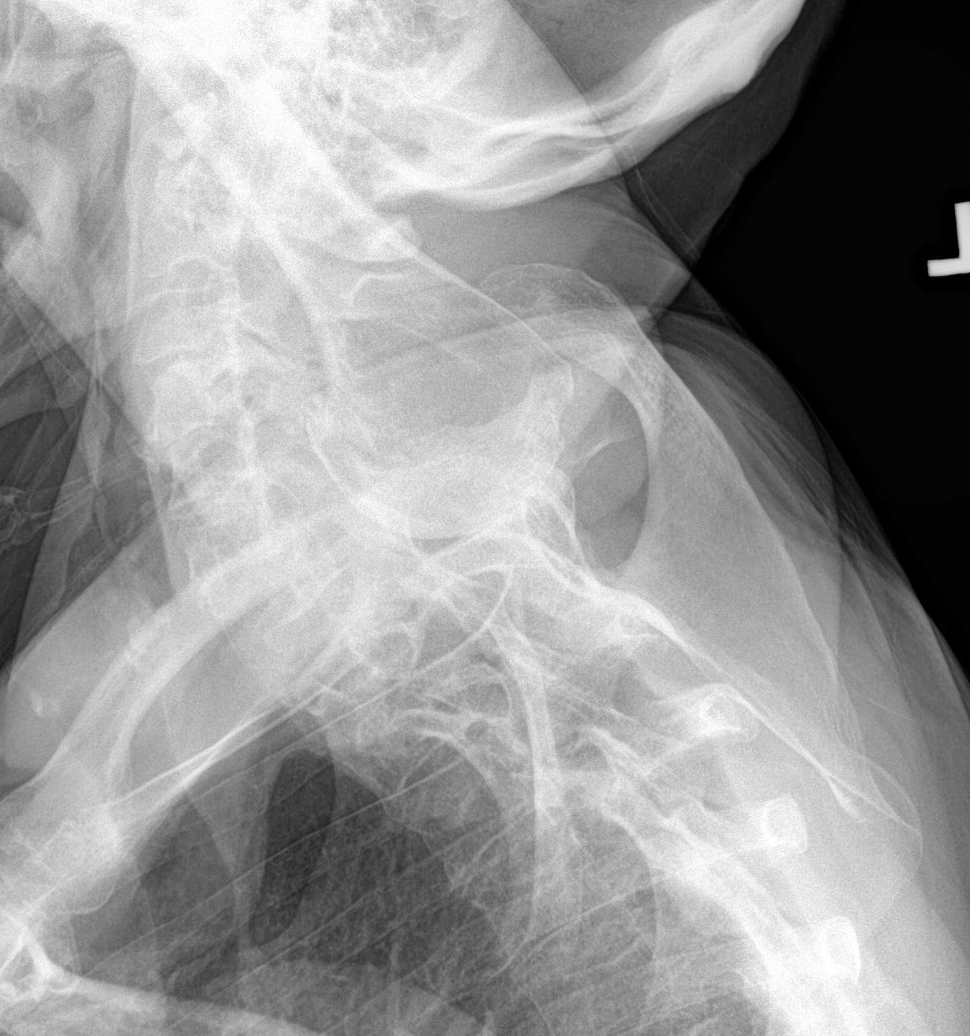

[6 of 6 positions shown; findings below may reference images not displayed]

FINDINGS: Cranial settling of the C1 ring with dens projecting superiorly as
noted on prior MR.

No obvious fracture although evaluation of the dens is limited on
frontal view.

Scoliosis cervical spine convex right.

Minimal anterior slip C3 with mild bilateral C3-4 foraminal
narrowing.

C4-5, C5-6 and C6-7 moderate disc degeneration with spur and
bilateral foraminal narrowing.

No abnormal prevertebral soft tissue swelling.

No lung apical mass identified.
IMPRESSION: Cranial settling of the C1 ring with dens projecting superiorly as
noted on prior MR.

Scoliosis cervical spine convex right.

Minimal anterior slip C3 with mild bilateral C3-4 foraminal
narrowing.

C4-5, C5-6 and C6-7 moderate disc degeneration with spur and
bilateral foraminal narrowing.

No obvious fracture although evaluation of the dens is limited on
frontal view.

## 2017-04-30 ENCOUNTER — Ambulatory Visit (INDEPENDENT_AMBULATORY_CARE_PROVIDER_SITE_OTHER): Payer: Medicare Other | Admitting: Podiatry

## 2017-04-30 DIAGNOSIS — M79676 Pain in unspecified toe(s): Secondary | ICD-10-CM

## 2017-04-30 DIAGNOSIS — M2010 Hallux valgus (acquired), unspecified foot: Secondary | ICD-10-CM

## 2017-04-30 DIAGNOSIS — B351 Tinea unguium: Secondary | ICD-10-CM | POA: Diagnosis not present

## 2017-04-30 DIAGNOSIS — L84 Corns and callosities: Secondary | ICD-10-CM

## 2017-04-30 NOTE — Progress Notes (Signed)
Complaint:  Visit Type: Patient returns to my office for continued preventative foot care services. Complaint: Patient states" my nails have grown long and thick and become painful to walk and wear shoes"  The patient presents for preventative foot care services. No changes to ROS.    Podiatric Exam: Vascular: dorsalis pedis and posterior tibial pulses are palpable bilateral. Capillary return is immediate. Temperature gradient is WNL. Skin turgor WNL  Sensorium: Normal Semmes Weinstein monofilament test. Normal tactile sensation bilaterally. Nail Exam: Pt has thick disfigured discolored nails with subungual debris noted bilateral entire nail hallux through fifth toenails Ulcer Exam: There is no evidence of ulcer or pre-ulcerative changes or infection. Orthopedic Exam: Muscle tone and strength are WNL. No limitations in general ROM. No crepitus or effusions noted. Foot type and digits show no abnormalities. 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 Skin: No Porokeratosis. No infection or ulcers.    Diagnosis:  Onychomycosis, , Pain in right toe, pain in left toes  Treatment & Plan Procedures and Treatment: Consent by patient was obtained for treatment procedures. The patient understood the discussion of treatment and procedures well. All questions were answered thoroughly reviewed. Debridement of mycotic and hypertrophic toenails, 1 through 5 bilateral and clearing of subungual debris. No ulceration, no infection noted. Return Visit-Office Procedure: Patient instructed to return to the office for a follow up visit 3 months for continued evaluation and treatment.    Gardiner Barefoot DPM

## 2017-05-03 ENCOUNTER — Encounter: Payer: Self-pay | Admitting: Primary Care

## 2017-05-03 DIAGNOSIS — E785 Hyperlipidemia, unspecified: Secondary | ICD-10-CM

## 2017-05-03 DIAGNOSIS — I10 Essential (primary) hypertension: Secondary | ICD-10-CM

## 2017-05-03 MED ORDER — SIMVASTATIN 40 MG PO TABS: 40.0000 mg | ORAL_TABLET | Freq: Every day | ORAL | 1 refills | Status: DC

## 2017-05-03 MED ORDER — ENALAPRIL-HYDROCHLOROTHIAZIDE 10-25 MG PO TABS: 1.0000 | ORAL_TABLET | Freq: Every day | ORAL | 1 refills | Status: DC

## 2017-05-03 MED ORDER — METOPROLOL TARTRATE 25 MG PO TABS: 25.0000 mg | ORAL_TABLET | Freq: Every day | ORAL | 1 refills | Status: DC

## 2017-05-21 ENCOUNTER — Other Ambulatory Visit: Payer: Self-pay | Admitting: *Deleted

## 2017-05-21 DIAGNOSIS — I10 Essential (primary) hypertension: Secondary | ICD-10-CM

## 2017-06-04 ENCOUNTER — Encounter: Payer: Self-pay | Admitting: Primary Care

## 2017-06-04 NOTE — Telephone Encounter (Signed)
Please call in clonazepam 0.5 mg tablets. Take 1 tablet by mouth at bedtime as needed for sleep. Use sparingly.  #30, no refills.

## 2017-06-04 NOTE — Telephone Encounter (Signed)
Ok to refill? Patient is requesting refill clonazePAM (KLONOPIN) 0.5 MG tablet  Last prescribed on 04/11/2017. Last seen on seen on 10/17/2016

## 2017-06-05 MED ORDER — CLONAZEPAM 0.5 MG PO TABS: ORAL_TABLET | ORAL | 0 refills | Status: DC

## 2017-06-05 NOTE — Telephone Encounter (Signed)
Called in medication to the pharmacy as instructed. 

## 2017-06-23 ENCOUNTER — Other Ambulatory Visit: Payer: Self-pay | Admitting: Primary Care

## 2017-07-13 ENCOUNTER — Other Ambulatory Visit: Payer: Self-pay | Admitting: Primary Care

## 2017-07-25 ENCOUNTER — Other Ambulatory Visit: Payer: Self-pay | Admitting: Primary Care

## 2017-07-25 DIAGNOSIS — F411 Generalized anxiety disorder: Secondary | ICD-10-CM

## 2017-07-25 NOTE — Telephone Encounter (Signed)
Last filled 06/05/17... Please advise

## 2017-07-26 MED ORDER — CLONAZEPAM 0.5 MG PO TABS: ORAL_TABLET | ORAL | 0 refills | Status: DC

## 2017-07-26 NOTE — Telephone Encounter (Signed)
Needs CPE or follow-up visit in April 2019, please schedule. Okay to phone in clonazepam 0.5 mg, take 1 tablet by mouth at bedtime as needed for sleep, use sparingly.  #30, no refills.

## 2017-07-27 NOTE — Telephone Encounter (Signed)
Message left for patient to return my call.  

## 2017-07-28 ENCOUNTER — Other Ambulatory Visit: Payer: Self-pay | Admitting: Primary Care

## 2017-07-28 DIAGNOSIS — F411 Generalized anxiety disorder: Secondary | ICD-10-CM

## 2017-07-30 NOTE — Telephone Encounter (Signed)
Medication was to the pharmacy as instructed. I have to confirm today.

## 2017-08-02 ENCOUNTER — Encounter: Payer: Self-pay | Admitting: Podiatry

## 2017-08-02 ENCOUNTER — Ambulatory Visit (INDEPENDENT_AMBULATORY_CARE_PROVIDER_SITE_OTHER): Payer: Medicare Other | Admitting: Podiatry

## 2017-08-02 DIAGNOSIS — M79676 Pain in unspecified toe(s): Secondary | ICD-10-CM

## 2017-08-02 DIAGNOSIS — B351 Tinea unguium: Secondary | ICD-10-CM

## 2017-08-02 DIAGNOSIS — Q828 Other specified congenital malformations of skin: Secondary | ICD-10-CM

## 2017-08-02 NOTE — Progress Notes (Signed)
Complaint:  Visit Type: Patient returns to my office for continued preventative foot care services. Complaint: Patient states" my nails have grown long and thick and become painful to walk and wear shoes"  The patient presents for preventative foot care services. No changes to ROS. Patient has painful callus right foot.   Podiatric Exam: Vascular: dorsalis pedis and posterior tibial pulses are palpable bilateral. Capillary return is immediate. Temperature gradient is WNL. Skin turgor WNL  Sensorium: Normal Semmes Weinstein monofilament test. Normal tactile sensation bilaterally. Nail Exam: Pt has thick disfigured discolored nails with subungual debris noted bilateral entire nail hallux through fifth toenails Ulcer Exam: There is no evidence of ulcer or pre-ulcerative changes or infection. Orthopedic Exam: Muscle tone and strength are WNL. No limitations in general ROM. No crepitus or effusions noted. Foot type and digits show no abnormalities. 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 Skin: No Porokeratosis. No infection or ulcers.  Heloma durum 5th toe right.  Porokeratosis sub 5th met right foot.  Diagnosis:  Onychomycosis, , Pain in right toe, pain in left toes  Treatment & Plan Procedures and Treatment: Consent by patient was obtained for treatment procedures. The patient understood the discussion of treatment and procedures well. All questions were answered thoroughly reviewed. Debridement of mycotic and hypertrophic toenails, 1 through 5 bilateral and clearing of subungual debris. No ulceration, no infection noted. Return Visit-Office Procedure: Patient instructed to return to the office for a follow up visit 3 months for continued evaluation and treatment.      DPM 

## 2017-08-25 ENCOUNTER — Other Ambulatory Visit: Payer: Self-pay | Admitting: Primary Care

## 2017-08-30 ENCOUNTER — Ambulatory Visit (INDEPENDENT_AMBULATORY_CARE_PROVIDER_SITE_OTHER): Payer: PPO | Admitting: Primary Care

## 2017-08-30 ENCOUNTER — Encounter: Payer: Self-pay | Admitting: Primary Care

## 2017-08-30 ENCOUNTER — Encounter: Payer: Self-pay | Admitting: Radiology

## 2017-08-30 VITALS — BP 122/70 | HR 58 | Temp 98.2°F | Ht 60.0 in | Wt 144.5 lb

## 2017-08-30 DIAGNOSIS — N289 Disorder of kidney and ureter, unspecified: Secondary | ICD-10-CM | POA: Diagnosis not present

## 2017-08-30 DIAGNOSIS — K219 Gastro-esophageal reflux disease without esophagitis: Secondary | ICD-10-CM | POA: Diagnosis not present

## 2017-08-30 DIAGNOSIS — M1990 Unspecified osteoarthritis, unspecified site: Secondary | ICD-10-CM

## 2017-08-30 DIAGNOSIS — L858 Other specified epidermal thickening: Secondary | ICD-10-CM | POA: Diagnosis not present

## 2017-08-30 DIAGNOSIS — E039 Hypothyroidism, unspecified: Secondary | ICD-10-CM | POA: Diagnosis not present

## 2017-08-30 DIAGNOSIS — I10 Essential (primary) hypertension: Secondary | ICD-10-CM

## 2017-08-30 DIAGNOSIS — Z79899 Other long term (current) drug therapy: Secondary | ICD-10-CM

## 2017-08-30 DIAGNOSIS — F411 Generalized anxiety disorder: Secondary | ICD-10-CM | POA: Diagnosis not present

## 2017-08-30 DIAGNOSIS — E785 Hyperlipidemia, unspecified: Secondary | ICD-10-CM

## 2017-08-30 LAB — LIPID PANEL
CHOL/HDL RATIO: 2
Cholesterol: 127 mg/dL (ref 0–200)
HDL: 57.1 mg/dL (ref >39.00)
LDL CALC: 56 mg/dL (ref 0–99)
NONHDL: 70.05
Triglycerides: 70 mg/dL (ref 0.0–149.0)
VLDL: 14 mg/dL (ref 0.0–40.0)

## 2017-08-30 LAB — COMPREHENSIVE METABOLIC PANEL
ALT: 11 U/L (ref 0–35)
AST: 16 U/L (ref 0–37)
Albumin: 4 g/dL (ref 3.5–5.2)
Alkaline Phosphatase: 58 U/L (ref 39–117)
BUN: 35 mg/dL — AB (ref 6–23)
CHLORIDE: 104 meq/L (ref 96–112)
CO2: 28 meq/L (ref 19–32)
Calcium: 9.3 mg/dL (ref 8.4–10.5)
Creatinine, Ser: 1.54 mg/dL — ABNORMAL HIGH (ref 0.40–1.20)
GFR: 34.47 mL/min — AB (ref >60.00)
GLUCOSE: 84 mg/dL (ref 70–99)
POTASSIUM: 4.6 meq/L (ref 3.5–5.1)
SODIUM: 139 meq/L (ref 135–145)
TOTAL PROTEIN: 6.6 g/dL (ref 6.0–8.3)
Total Bilirubin: 0.8 mg/dL (ref 0.2–1.2)

## 2017-08-30 LAB — TSH: TSH: 0.92 u[IU]/mL (ref 0.35–4.50)

## 2017-08-30 MED ORDER — RANITIDINE HCL 150 MG PO TABS: 150.0000 mg | ORAL_TABLET | Freq: Every day | ORAL | 3 refills | Status: DC

## 2017-08-30 MED ORDER — SIMVASTATIN 40 MG PO TABS: 40.0000 mg | ORAL_TABLET | Freq: Every day | ORAL | 3 refills | Status: DC

## 2017-08-30 MED ORDER — LEVOTHYROXINE SODIUM 88 MCG PO TABS: ORAL_TABLET | ORAL | 0 refills | Status: DC

## 2017-08-30 MED ORDER — CLONAZEPAM 0.5 MG PO TABS: ORAL_TABLET | ORAL | 0 refills | Status: DC

## 2017-08-30 MED ORDER — METOPROLOL TARTRATE 25 MG PO TABS: ORAL_TABLET | ORAL | 3 refills | Status: DC

## 2017-08-30 MED ORDER — ENALAPRIL-HYDROCHLOROTHIAZIDE 10-25 MG PO TABS: 1.0000 | ORAL_TABLET | Freq: Every day | ORAL | 3 refills | Status: DC

## 2017-08-30 NOTE — Assessment & Plan Note (Signed)
Repeat TSH pending, continue levothyroxine 88 mcg for now. Taking appropriately.

## 2017-08-30 NOTE — Assessment & Plan Note (Signed)
Stable in the office today, continue current regimen. Refills sent to pharmacy. BMP pending. 

## 2017-08-30 NOTE — Progress Notes (Signed)
Subjective:    Patient ID: Kathleen Paul, female    DOB: [birth year], 82 y.o.   MRN: [id number]  HPI  Kathleen Paul is a 82 year old female who presents today for follow up. She is out of all of her medications, took last pills today.   1) Essential Hypertension: Currently managed on enalapril-HCTZ 10-25 mg, metoprolol tartrate 25 mg. She checks her BP at home and is getting readings of 120's/70's. She denies dizziness, chest pain, blurred vision.   BP Readings from Last 3 Encounters:  08/30/17 122/70  03/19/17 126/60  10/17/16 120/72     2) Hyperlipidemia: Currently managed on Simvastatin 40 mg. She is fasting today.   3) Hypothyroidism: Currently managed on levothyroxine 88 mcg. She is taking her medication every morning on an empty stomach with water, not eating within 30 minutes after take.   4) GAD/Insomnia: Currently managed on Clonazepam at bedtime for which she's taken for years. She's taking this five nights of the week mostly. She's doing well on this regimen.   5) GERD: Currently managed on ranitidine 150 mg for which she takes once daily on average. She has no symptoms on this regimen.   6) Cutaneous Horn: Located to the left anterior forearm which has been present for several years. She's noticed gradual increase in length over time. She denies pain, erythema.  Review of Systems  Eyes: Negative for visual disturbance.  Respiratory: Negative for shortness of breath.   Cardiovascular: Negative for chest pain.  Skin:       Cutaneous horn to left forearm  Neurological: Negative for dizziness and headaches.  Psychiatric/Behavioral: Negative for sleep disturbance. The patient is not nervous/anxious.        Past Medical History:  Diagnosis Date  . Anxiety state, unspecified   . Arthritis   . Atherosclerosis   . Diverticulosis   . Esophageal stricture   . Essential hypertension, benign   . History of DVT (deep vein thrombosis)    legs  . Inguinal hernia without  mention of obstruction or gangrene, unilateral or unspecified, (not specified as recurrent)    2014  . Internal hemorrhoids   . Mild mitral regurgitation    a. 04/2010 Echo: nl EF, mild diast dysfxn, trace PR, mild TR, mild-mod MR.  . Non-obstructive CAD    a. 04/2010 Myoview: EF 77%, fixed apical/inferior defect;  b. 04/2010 Cath: LAD 78m, LCX/RCA minor irregs, EF 60%;  c. 04/2011 Myoview: low risk.  . Pure hypercholesterolemia   . Scoliosis   . UI (urinary incontinence)   . Umbilical hernia without mention of obstruction or gangrene    2014  . Unspecified hypothyroidism   . Vitamin D deficiency      Social History   Socioeconomic History  . Marital status: Widowed    Spouse name: Not on file  . Number of children: Not on file  . Years of education: Not on file  . Highest education level: Not on file  Social Needs  . Financial resource strain: Not on file  . Food insecurity - worry: Not on file  . Food insecurity - inability: Not on file  . Transportation needs - medical: Not on file  . Transportation needs - non-medical: Not on file  Occupational History  . Not on file  Tobacco Use  . Smoking status: Former Smoker    Packs/day: 0.50    Years: 30.00    Pack years: 15.00    Types: Cigarettes    Last  attempt to quit: 07/10/1988    Years since quitting: 29.1  . Smokeless tobacco: Never Used  Substance and Sexual Activity  . Alcohol use: No  . Drug use: No  . Sexual activity: Not on file  Other Topics Concern  . Not on file  Social History Narrative  . Not on file    Past Surgical History:  Procedure Laterality Date  . ABDOMINAL HYSTERECTOMY  2008  . CARDIAC CATHETERIZATION  2011   [facility]  . COLONOSCOPY  2003   [facility]  . HERNIA REPAIR Right 09/23/2012   repair [facility]  . LAPAROSCOPIC HYSTERECTOMY    . TONSILLECTOMY      Family History  Problem Relation Age of Onset  . Heart disease Mother   . Heart attack Mother   . Heart disease Father   . Heart  attack Father   . Breast cancer Sister   . Breast cancer Maternal Aunt   . Heart disease Brother        sister    Allergies  Allergen Reactions  . Codeine Nausea Only    Current Outpatient Medications on File Prior to Visit  Medication Sig Dispense Refill  . Biotin 1000 MCG tablet Take 1,000 mcg by mouth daily.    . Calcium Carbonate-Vitamin D 600-200 MG-UNIT TABS Take by mouth.    . diclofenac (VOLTAREN) 75 MG EC tablet     . Multiple Vitamin (ONE-A-DAY ESSENTIAL) TABS Take by mouth.     No current facility-administered medications on file prior to visit.     BP 122/70   Pulse (!) 58   Temp 98.2 F (36.8 C) (Oral)   Ht 5' (1.524 m)   Wt 144 lb 8 oz (65.5 kg)   BMI 28.22 kg/m    Objective:   Physical Exam  Constitutional: She appears well-nourished.  Neck: Neck supple.  Cardiovascular: Normal rate and regular rhythm.  Pulmonary/Chest: Effort normal and breath sounds normal.  Skin: Skin is warm and dry.  Cutaneous horn to left posterior forearm (anatomical position) measuring 0.5 cm in length. Non tender. Flesh colored.   Psychiatric: She has a normal mood and affect.          Assessment & Plan:  Cutaneous Horn Removal:  Consent obtained. Site cleansed. Pain ease applied for analgesia.  Cutaneous horn removed without difficulty with 11 blade and forceps. Site cleansed. Minimal to no bleeding. Band-aid applied. Home instructions provided.  [name], NP

## 2017-08-30 NOTE — Patient Instructions (Addendum)
Stop by the lab prior to leaving today. I will notify you of your results once received.   We sent refills of your medications to the pharmacy.   It was a pleasure to see you today!

## 2017-08-30 NOTE — Assessment & Plan Note (Signed)
Repeat BMP pending. 

## 2017-08-30 NOTE — Assessment & Plan Note (Signed)
Using diclofenac every other day given reduced renal function. Advised to refrain from use if possible. Repeat BMP pending.

## 2017-08-30 NOTE — Assessment & Plan Note (Signed)
Doing well on Zantac 150 mg HS, continue same. 

## 2017-08-30 NOTE — Assessment & Plan Note (Signed)
Lipid panel pending today, refilled Simvastatin.

## 2017-08-31 ENCOUNTER — Encounter: Payer: Self-pay | Admitting: Primary Care

## 2017-09-04 LAB — PAIN MGMT, PROFILE 8 W/CONF, U
6 ACETYLMORPHINE: NEGATIVE ng/mL (ref <10)
ALCOHOL METABOLITES: NEGATIVE ng/mL (ref <500)
ALPHAHYDROXYALPRAZOLAM: NEGATIVE ng/mL (ref <25)
AMPHETAMINES: NEGATIVE ng/mL (ref <500)
Alphahydroxymidazolam: NEGATIVE ng/mL (ref <50)
Alphahydroxytriazolam: NEGATIVE ng/mL (ref <50)
Aminoclonazepam: 403 ng/mL — ABNORMAL HIGH (ref <25)
BENZODIAZEPINES: POSITIVE ng/mL — AB (ref <100)
Buprenorphine, Urine: NEGATIVE ng/mL (ref <5)
COCAINE METABOLITE: NEGATIVE ng/mL (ref <150)
Creatinine: 96.7 mg/dL
Hydroxyethylflurazepam: NEGATIVE ng/mL (ref <50)
LORAZEPAM: NEGATIVE ng/mL (ref <50)
MDMA: NEGATIVE ng/mL (ref <500)
Marijuana Metabolite: NEGATIVE ng/mL (ref <20)
NORDIAZEPAM: NEGATIVE ng/mL (ref <50)
Opiates: NEGATIVE ng/mL (ref <100)
Oxazepam: NEGATIVE ng/mL (ref <50)
Oxidant: NEGATIVE ug/mL (ref <200)
Oxycodone: NEGATIVE ng/mL (ref <100)
TEMAZEPAM: NEGATIVE ng/mL (ref <50)
pH: 6.22 (ref 4.5–9.0)

## 2017-09-07 DIAGNOSIS — H10501 Unspecified blepharoconjunctivitis, right eye: Secondary | ICD-10-CM | POA: Diagnosis not present

## 2017-09-14 DIAGNOSIS — H10501 Unspecified blepharoconjunctivitis, right eye: Secondary | ICD-10-CM | POA: Diagnosis not present

## 2017-09-27 ENCOUNTER — Ambulatory Visit: Payer: Medicare Other | Admitting: Internal Medicine

## 2017-10-01 DIAGNOSIS — N183 Chronic kidney disease, stage 3 (moderate): Secondary | ICD-10-CM | POA: Diagnosis not present

## 2017-10-04 ENCOUNTER — Telehealth: Payer: Self-pay | Admitting: Primary Care

## 2017-10-04 DIAGNOSIS — M1990 Unspecified osteoarthritis, unspecified site: Secondary | ICD-10-CM

## 2017-10-04 NOTE — Telephone Encounter (Signed)
Please notify patient that I reviewed her most recent visit with the kidney doctor and it looks like they recommend she stop taking diclofenac for her arthritis pain. If she decides to remain on diclofenac her kidneys may continue to decrease in function. Is she interested in seeing a rheumatologist for further evaluation of her arthritis symptoms so we can get her off of diclofenac? It's not safe for her to try other pain medications like Tramadol as she's on Clonazepam daily.

## 2017-10-05 NOTE — Addendum Note (Signed)
Addended by: [name] on: 10/05/2017 01:50 PM   Modules accepted: Orders

## 2017-10-05 NOTE — Telephone Encounter (Signed)
Spoke to pt

## 2017-10-05 NOTE — Telephone Encounter (Signed)
Please notify patient that I placed the referral for rheumatology.

## 2017-10-05 NOTE — Telephone Encounter (Signed)
Patient would like a call back from [facility]. She would like a referral to rheumatology.

## 2017-10-05 NOTE — Telephone Encounter (Signed)
Left a detailed message per dpr with the information and asked her to call us if she is interested in a referral.

## 2017-11-01 ENCOUNTER — Ambulatory Visit: Payer: Self-pay | Admitting: Podiatry

## 2017-11-05 ENCOUNTER — Encounter: Payer: Self-pay | Admitting: Podiatry

## 2017-11-05 ENCOUNTER — Ambulatory Visit: Payer: PPO | Admitting: Podiatry

## 2017-11-05 DIAGNOSIS — Q828 Other specified congenital malformations of skin: Secondary | ICD-10-CM

## 2017-11-05 DIAGNOSIS — M2010 Hallux valgus (acquired), unspecified foot: Secondary | ICD-10-CM

## 2017-11-05 DIAGNOSIS — B351 Tinea unguium: Secondary | ICD-10-CM

## 2017-11-05 DIAGNOSIS — M79676 Pain in unspecified toe(s): Secondary | ICD-10-CM | POA: Diagnosis not present

## 2017-11-05 NOTE — Progress Notes (Signed)
Complaint:  Visit Type: Patient returns to my office for continued preventative foot care services. Complaint: Patient states" my nails have grown long and thick and become painful to walk and wear shoes"  The patient presents for preventative foot care services. No changes to ROS. Patient has painful callus right foot.   Podiatric Exam: Vascular: dorsalis pedis and posterior tibial pulses are palpable bilateral. Capillary return is immediate. Temperature gradient is WNL. Skin turgor WNL  Sensorium: Normal Semmes Weinstein monofilament test. Normal tactile sensation bilaterally. Nail Exam: Pt has thick disfigured discolored nails with subungual debris noted bilateral entire nail hallux through fifth toenails Ulcer Exam: There is no evidence of ulcer or pre-ulcerative changes or infection. Orthopedic Exam: Muscle tone and strength are WNL. No limitations in general ROM. No crepitus or effusions noted. Foot type and digits show no abnormalities. 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 Skin: No Porokeratosis. No infection or ulcers.  Heloma durum 5th toe right.  Porokeratosis sub 5th met B/L asymptomatic.    Diagnosis:  Onychomycosis, , Pain in right toe, pain in left toes  Heloma durum 5th right  Treatment & Plan Procedures and Treatment: Consent by patient was obtained for treatment procedures. The patient understood the discussion of treatment and procedures well. All questions were answered thoroughly reviewed. Debridement of mycotic and hypertrophic toenails, 1 through 5 bilateral and clearing of subungual debris. No ulceration, no infection noted.Debride corn fifth toe right Return Visit-Office Procedure: Patient instructed to return to the office for a follow up visit 3 months for continued evaluation and treatment.    Gardiner Barefoot DPM

## 2017-11-14 NOTE — Progress Notes (Signed)
Office Visit Note  Patient: Kathleen Paul             Date of Birth: [birth year]           MRN: [id number]             PCP: [name], NP Referring: [name], NP Visit Date: 11/28/2017 Occupation: _0 @    Subjective:  New Patient (Initial Visit) (Arthritis)   History of Present Illness: Kathleen Paul is a 82 y.o. female seen in consultation per request of her PCP.  According to patient she has had arthritis for at least 15 years and her knee joints.  She states she had been followed at [facility] where initially she was given cortisone injections and then was advised total knee replacement.  Patient did not want to have surgery.  She states she has been on diclofenac 75 mg twice daily for multiple years.  She has been also getting Visco supplement injections in her knee joints which only last for 3 months.  She has not had Visco supplement injections in the last 1 year.  Recent labs showed elevation in her creatinine and she was advised to discontinue diclofenac.  She states coming off diclofenac makes her joint pain worse.  She has been taking only one diclofenac tablet a day.  She states she had nephrologist visit about 3 months ago and they advised her to discontinue the NSAIDs.  She is still continues to take 1 tablet a day.  She has had discomfort in her bilateral shoulders for the last 2 years.  She states she had a right rotator cuff tear with the surgery was not advised.  She has limited range of motion in her right shoulder.  She has nocturnal pain in her bilateral shoulders and have difficulty lifting both shoulders.  She reports intermittent swelling in her bilateral knee joints.  None of the other joints are painful.  Activities of Daily Living:  Patient reports morning stiffness for 24 hours.   Patient Reports nocturnal pain.  Difficulty dressing/grooming: Reports Difficulty climbing stairs: Reports Difficulty getting out of chair:  Reports Difficulty using hands for taps, buttons, cutlery, and/or writing: Denies   Review of Systems  Constitutional: Positive for fatigue. Negative for activity change, night sweats, weight gain and weight loss.  HENT: Positive for trouble swallowing, trouble swallowing and mouth dryness. Negative for mouth sores and nose dryness.   Eyes: Negative for pain, redness, visual disturbance and dryness.  Respiratory: Negative for cough, shortness of breath and difficulty breathing.   Cardiovascular: Positive for swelling in legs/feet. Negative for chest pain, palpitations, hypertension and irregular heartbeat.  Gastrointestinal: Negative for blood in stool, constipation and diarrhea.  Endocrine: Negative for excessive thirst and increased urination.  Genitourinary: Negative for difficulty urinating and vaginal dryness.  Musculoskeletal: Positive for arthralgias, gait problem, joint pain, joint swelling, muscle weakness and morning stiffness. Negative for myalgias, muscle tenderness and myalgias.  Skin: Negative for color change, rash, hair loss, skin tightness, ulcers and sensitivity to sunlight.  Allergic/Immunologic: Negative for susceptible to infections.  Neurological: Positive for weakness. Negative for dizziness, numbness, memory loss and night sweats.  Hematological: Negative for bruising/bleeding tendency and swollen glands.  Psychiatric/Behavioral: Positive for sleep disturbance. Negative for depressed mood. The patient is not nervous/anxious.     PMFS History:  Patient Active Problem List   Diagnosis Date Noted  . Gastroesophageal reflux disease 08/30/2017  . GAD (generalized anxiety disorder) 09/05/2016  . Osteoarthritis 09/05/2016  .  Renal insufficiency 07/17/2013  . Suprapubic mass 08/20/2012  . Hypothyroidism   . UI (urinary incontinence)   . Stricture and stenosis of esophagus 04/11/2011  . Chest pain 03/29/2011  . Dyslipidemia 03/29/2011  . Hypertension 03/29/2011  .  CAD (coronary artery disease) 03/29/2011  . Palpitations 03/29/2011    Past Medical History:  Diagnosis Date  . Anxiety state, unspecified   . Arthritis   . Atherosclerosis   . Diverticulosis   . Esophageal stricture   . Essential hypertension, benign   . History of DVT (deep vein thrombosis)    legs  . Inguinal hernia without mention of obstruction or gangrene, unilateral or unspecified, (not specified as recurrent)    2014  . Internal hemorrhoids   . Mild mitral regurgitation    a. 04/2010 Echo: nl EF, mild diast dysfxn, trace PR, mild TR, mild-mod MR.  . Non-obstructive CAD    a. 04/2010 Myoview: EF 77%, fixed apical/inferior defect;  b. 04/2010 Cath: LAD 57m LCX/RCA minor irregs, EF 60%;  c. 04/2011 Myoview: low risk.  . Pure hypercholesterolemia   . Scoliosis   . UI (urinary incontinence)   . Umbilical hernia without mention of obstruction or gangrene    2014  . Unspecified hypothyroidism   . Vitamin D deficiency     Family History  Problem Relation Age of Onset  . Heart disease Mother   . Heart attack Mother   . Heart disease Father   . Heart attack Father   . Breast cancer Sister   . Breast cancer Maternal Aunt   . Heart disease Brother        sister   Past Surgical History:  Procedure Laterality Date  . ABDOMINAL HYSTERECTOMY  2008  . CARDIAC CATHETERIZATION  2011   [facility]  . COLONOSCOPY  2003   [facility] . HERNIA REPAIR Right 09/23/2012   repair [facility]  . LAPAROSCOPIC HYSTERECTOMY    . TONSILLECTOMY     Social History   Social History Narrative  . Not on file     Objective: Vital Signs: BP (!) 163/68 (BP Location: Left Arm, Patient Position: Sitting, Cuff Size: Normal)   Pulse (!) 54   Resp 18   Ht 5' (1.524 m)   Wt 144 lb (65.3 kg)   BMI 28.12 kg/m    Physical Exam  Constitutional: She is oriented to person, place, and time. She appears well-developed and well-nourished.  HENT:  Head: Normocephalic and atraumatic.  Eyes:  Conjunctivae and EOM are normal.  Neck: Normal range of motion.  Cardiovascular: Normal rate, regular rhythm, normal heart sounds and intact distal pulses.  Pulmonary/Chest: Effort normal and breath sounds normal.  Abdominal: Soft. Bowel sounds are normal.  Lymphadenopathy:    She has no cervical adenopathy.  Neurological: She is alert and oriented to person, place, and time.  Skin: Skin is warm and dry. Capillary refill takes less than 2 seconds.  Psychiatric: She has a normal mood and affect. Her behavior is normal.  Nursing note and vitals reviewed.    Musculoskeletal Exam: C-spine thoracic lumbar spine limited range of motion.  She has thoracic kyphosis.  Right shoulder abduction was limited to 30 degrees.  Left shoulder joint abduction was limited to 110 degrees.  Elbow joints were in good range of motion.  She has some limitation with the bilateral wrist joint flexion.  She has some DIP PIP thickening but no synovitis was noted.  She has good range of motion of her hip  joints.  She has crepitus and warmth in her bilateral knee joints.  She has overcrowding of her toes with hammertoes.  CDAI Exam: No CDAI exam completed.    Investigation: No additional findings.  CBC Latest Ref Rng & Units 11/09/2014 11/09/2014 03/17/2014  WBC 4.0 - 10.5 K/uL - 5.4 6.4  Hemoglobin 12.0 - 15.0 g/dL 10.9(L) 9.8(L) 11.3  Hematocrit 36.0 - 46.0 % 32.0(L) 29.7(L) 33.7(L)  Platelets 150 - 400 K/uL - 248 341   CMP Latest Ref Rng & Units 08/30/2017 10/17/2016 09/05/2016  Glucose 70 - 99 mg/dL 84 97 80  BUN 6 - 23 mg/dL 35(H) 26(H) 45(H)  Creatinine 0.40 - 1.20 mg/dL 1.54(H) 1.48(H) 1.48(H)  Sodium 135 - 145 mEq/L 139 134(L) 138  Potassium 3.5 - 5.1 mEq/L 4.6 4.2 4.1  Chloride 96 - 112 mEq/L 104 98 104  CO2 19 - 32 mEq/L _0 Calcium 8.4 - 10.5 mg/dL 9.3 9.6 8.9  Total Protein 6.0 - 8.3 g/dL 6.6 - 6.5  Total Bilirubin 0.2 - 1.2 mg/dL 0.8 - 0.7  Alkaline Phos 39 - 117 U/L 58 - 56  AST 0 - 37 U/L 16 -  20  ALT 0 - 35 U/L 11 - 17  GFR 34.47 Imaging: Xr Foot 2 Views Left  Result Date: 11/28/2017 First MTP subluxation was noted.  Second and third MTP subluxation was noted.  No other MTP changes were noted.  No erosive changes were noted.  DIP and PIP narrowing was noted. Impression: These findings are consistent with severe osteoarthritis of the foot.  Xr Foot 2 Views Right  Result Date: 11/28/2017 First MTP subluxation was noted.  Second and third MTP subluxation was noted.  No other MTP changes were noted.  No erosive changes were noted.  DIP and PIP narrowing was noted. Impression: These findings are consistent with severe osteoarthritis of the foot.  Xr Hand 2 View Left  Result Date: 11/28/2017 Severe intercarpal and radiocarpal joint space narrowing with cystic versus erosive changes were noted.  Chondrocalcinosis was noted.  Severe CMC joint narrowing and subluxation was noted.  First MCP subluxation was noted.  Second and third MCP narrowing was noted.  PIP and DIP narrowing was noted. Impression: These findings are consistent with osteoarthritis and inflammatory arthritis.  Differential diagnosis will include rheumatoid arthritis or crystal induced arthropathy.  Xr Hand 2 View Right  Result Date: 11/28/2017 Second and third MCP severe narrowing was noted.  All PIP and DIP narrowing was noted.  Severe intercarpal and radiocarpal joint space narrowing was noted.  Chondrocalcinosis was noted in the wrist joint.  Cystic versus erosive changes in the carpal bones were noted.  Severe CMC narrowing was noted. Impression: These findings could be consistent with inflammatory arthritis differential diagnosis will be rheumatoid arthritis or crystal induced arthropathy.  Xr Knee 3 View Left  Result Date: 11/28/2017 Severe lateral compartment narrowing with medial intercondylar and lateral osteophytes and possible chondrocalcinosis.  Severe patellofemoral narrowing with patellar subluxation was  noted. Impression: Severe osteoarthritis and severe chondromalacia patella.  Xr Knee 3 View Right  Result Date: 11/28/2017 Severe medial compartment narrowing with medial intercondylar and lateral osteophytes.  Possible chondrocalcinosis was noted.  Severe patellofemoral narrowing with subluxation of patella was noted. Impression: Severe osteoarthritis and severe chondromalacia patella with patellar subluxation.  Xr Shoulder Left  Result Date: 11/28/2017 Glenohumeral joint space narrowing was noted.  No chondrocalcinosis was noted.  Cystic changes are noted in the humeral head.  Acromioclavicular joint space  narrowing was noted. Impression: Moderate to severe arthritis of the shoulder joint.  Xr Shoulder Right  Result Date: 11/28/2017 Severe glenohumeral joint space narrowing was noted.  Severe acromioclavicular joint space narrowing was noted.  No chondrocalcinosis was noted. Impression: Severe arthritis of the shoulder joint.   Speciality Comments: No specialty comments available.    Procedures:  No procedures performed Allergies: Codeine   Assessment / Plan:     Visit Diagnoses: Primary osteoarthritis of both knees - no autoimmune labs in EPIC - Plan: XR KNEE 3 VIEW RIGHT, XR KNEE 3 VIEW LEFT.  The x-ray of bilateral knee joints showed severe osteoarthritis and possible chondrocalcinosis.  She also has severe chondromalacia patella with patellar subluxation.  She has difficulty walking.  She also has some warmth and swelling in her knee joints.  She has been getting cortisone and Visco supplement injections at [facility] of her.  Primary osteoarthritis of both shoulders - Plan: XR Shoulder Right, XR Shoulder Left she has severe osteoarthritis in her bilateral shoulder joints more severe on the right than the left.  Most likely due to an underlying inflammatory arthritis.  Pain in both hands - Plan: XR Hand 2 View Right, XR Hand 2 View Left, she has severe arthritis in her  hands with bilateral MCP involvement and intercarpal and radiocarpal joints involvement.  Possible erosive and cystic changes in the wrist joints.  Chondrocalcinosis was noted in the wrist joints.  The differential diagnosis could be inflammatory arthritis or crystal induced arthropathy.  I will obtain following labs today.  Rheumatoid factor, Sedimentation rate, Cyclic citrul peptide antibody, IgG, Uric acid, HLA-B27 antigen, Angiotensin converting enzyme, Iron, TIBC and Ferritin Panel, Magnesium, Glucose 6 phosphate dehydrogenase  Pain in both feet - Plan: XR Foot 2 Views Right, XR Foot 2 Views Left.  She has severe osteoarthritic changes in her feet with subluxation of the MTPs.  Renal insufficiency - Referral placed for evaluation of nonnarcotic non-NSAID treatment options.  I have advised her to discontinue diclofenac due to low GFR.  Discussed use of Tylenol instead.  As she has inflammatory arthritis she will probably experience more discomfort with Tylenol.  Essential hypertension-her systolic blood pressure is elevated.  Have advised her to follow-up with her PCP.  Dyslipidemia  History of coronary artery disease  Stricture and stenosis of esophagus  Palpitations  History of gastroesophageal reflux (GERD)  History of diverticulosis  GAD (generalized anxiety disorder)  History of DVT (deep vein thrombosis)   Patient is uncertain that when that she have her DEXA scan.  Have advised her to discuss with her PCP.  She will not be a candidate for oral bisphosphonates with history of esophageal stricture.   Orders: Orders Placed This Encounter  Procedures  . XR Shoulder Right  . XR Shoulder Left  . XR Hand 2 View Right  . XR Hand 2 View Left  . XR KNEE 3 VIEW RIGHT  . XR KNEE 3 VIEW LEFT  . XR Foot 2 Views Right  . XR Foot 2 Views Left  . Rheumatoid factor  . Sedimentation rate  . Cyclic citrul peptide antibody, IgG  . Uric acid  . HLA-B27 antigen  . Angiotensin  converting enzyme  . Iron, TIBC and Ferritin Panel  . Magnesium  . Glucose 6 phosphate dehydrogenase   No orders of the defined types were placed in this encounter.   Face-to-face time spent with patient was 60 minutes. 50% of time was spent in counseling and coordination of  care.  Follow-Up Instructions: Return for Osteoarthritis, polyarthralgia.   Bo Merino, MD  Note - This record has been created using Editor, commissioning.  Chart creation errors have been sought, but may not always  have been located. Such creation errors do not reflect on  the standard of medical care.

## 2017-11-19 ENCOUNTER — Other Ambulatory Visit: Payer: Self-pay | Admitting: Primary Care

## 2017-11-19 DIAGNOSIS — E039 Hypothyroidism, unspecified: Secondary | ICD-10-CM

## 2017-11-28 ENCOUNTER — Ambulatory Visit (INDEPENDENT_AMBULATORY_CARE_PROVIDER_SITE_OTHER): Payer: Self-pay

## 2017-11-28 ENCOUNTER — Ambulatory Visit (INDEPENDENT_AMBULATORY_CARE_PROVIDER_SITE_OTHER): Payer: PPO

## 2017-11-28 ENCOUNTER — Ambulatory Visit: Payer: PPO | Admitting: Rheumatology

## 2017-11-28 ENCOUNTER — Encounter: Payer: Self-pay | Admitting: Rheumatology

## 2017-11-28 VITALS — BP 163/68 | HR 54 | Resp 18 | Ht 60.0 in | Wt 144.0 lb

## 2017-11-28 DIAGNOSIS — M19011 Primary osteoarthritis, right shoulder: Secondary | ICD-10-CM

## 2017-11-28 DIAGNOSIS — M79641 Pain in right hand: Secondary | ICD-10-CM

## 2017-11-28 DIAGNOSIS — M79642 Pain in left hand: Secondary | ICD-10-CM

## 2017-11-28 DIAGNOSIS — M19012 Primary osteoarthritis, left shoulder: Secondary | ICD-10-CM

## 2017-11-28 DIAGNOSIS — M170 Bilateral primary osteoarthritis of knee: Secondary | ICD-10-CM | POA: Diagnosis not present

## 2017-11-28 DIAGNOSIS — I10 Essential (primary) hypertension: Secondary | ICD-10-CM

## 2017-11-28 DIAGNOSIS — E785 Hyperlipidemia, unspecified: Secondary | ICD-10-CM | POA: Diagnosis not present

## 2017-11-28 DIAGNOSIS — F411 Generalized anxiety disorder: Secondary | ICD-10-CM | POA: Diagnosis not present

## 2017-11-28 DIAGNOSIS — M79672 Pain in left foot: Secondary | ICD-10-CM | POA: Diagnosis not present

## 2017-11-28 DIAGNOSIS — M79671 Pain in right foot: Secondary | ICD-10-CM

## 2017-11-28 DIAGNOSIS — Z8719 Personal history of other diseases of the digestive system: Secondary | ICD-10-CM | POA: Diagnosis not present

## 2017-11-28 DIAGNOSIS — Z86718 Personal history of other venous thrombosis and embolism: Secondary | ICD-10-CM

## 2017-11-28 DIAGNOSIS — Z8679 Personal history of other diseases of the circulatory system: Secondary | ICD-10-CM | POA: Diagnosis not present

## 2017-11-28 DIAGNOSIS — N289 Disorder of kidney and ureter, unspecified: Secondary | ICD-10-CM | POA: Diagnosis not present

## 2017-11-28 DIAGNOSIS — R002 Palpitations: Secondary | ICD-10-CM | POA: Diagnosis not present

## 2017-11-28 DIAGNOSIS — K222 Esophageal obstruction: Secondary | ICD-10-CM | POA: Diagnosis not present

## 2017-11-29 LAB — GLUCOSE 6 PHOSPHATE DEHYDROGENASE: G-6PDH: 19.1 U/g Hgb (ref 7.0–20.5)

## 2017-11-29 LAB — ANGIOTENSIN CONVERTING ENZYME: Angiotensin-Converting Enzyme: <5 U/L — ABNORMAL LOW (ref 9–67)

## 2017-11-29 LAB — SEDIMENTATION RATE: Sed Rate: 14 mm/h (ref 0–30)

## 2017-11-29 LAB — URIC ACID: Uric Acid, Serum: 4.9 mg/dL (ref 2.5–7.0)

## 2017-11-29 LAB — IRON,TIBC AND FERRITIN PANEL
%SAT: 30 % (calc) (ref 11–50)
FERRITIN: 20 ng/mL (ref 20–288)
IRON: 109 ug/dL (ref 45–160)
TIBC: 363 ug/dL (ref 250–450)

## 2017-11-29 LAB — MAGNESIUM: Magnesium: 1.8 mg/dL (ref 1.5–2.5)

## 2017-11-29 LAB — CYCLIC CITRUL PEPTIDE ANTIBODY, IGG: Cyclic Citrullin Peptide Ab: 43 UNITS — ABNORMAL HIGH

## 2017-11-29 LAB — RHEUMATOID FACTOR: Rhuematoid fact SerPl-aCnc: <14 IU/mL (ref <14)

## 2017-11-29 LAB — HLA-B27 ANTIGEN: HLA-B27 Antigen: NEGATIVE

## 2017-11-29 NOTE — Progress Notes (Signed)
I will discuss lab results at the follow-up visit.

## 2017-12-24 DIAGNOSIS — M19072 Primary osteoarthritis, left ankle and foot: Secondary | ICD-10-CM

## 2017-12-24 DIAGNOSIS — M19042 Primary osteoarthritis, left hand: Secondary | ICD-10-CM

## 2017-12-24 DIAGNOSIS — M170 Bilateral primary osteoarthritis of knee: Secondary | ICD-10-CM | POA: Insufficient documentation

## 2017-12-24 DIAGNOSIS — M19012 Primary osteoarthritis, left shoulder: Secondary | ICD-10-CM

## 2017-12-24 DIAGNOSIS — M19011 Primary osteoarthritis, right shoulder: Secondary | ICD-10-CM | POA: Insufficient documentation

## 2017-12-24 DIAGNOSIS — M0609 Rheumatoid arthritis without rheumatoid factor, multiple sites: Secondary | ICD-10-CM | POA: Insufficient documentation

## 2017-12-24 DIAGNOSIS — M19071 Primary osteoarthritis, right ankle and foot: Secondary | ICD-10-CM | POA: Insufficient documentation

## 2017-12-24 DIAGNOSIS — M19041 Primary osteoarthritis, right hand: Secondary | ICD-10-CM | POA: Insufficient documentation

## 2017-12-24 NOTE — Progress Notes (Signed)
Office Visit Note  Patient: Kathleen Paul             Date of Birth: 1936/05/28           MRN: [id number]             PCP: [name], NP Referring: [name], NP Visit Date: 01/04/2018 Occupation: _0 @    Subjective:  Pain in bilateral shoulders and knees.   History of Present Illness: Kathleen Paul is a 82 y.o. female with seropositive rheumatoid arthritis and chondrocalcinosis.  She continues to have pain and discomfort in her bilateral shoulders and bilateral knee joints.  She continues to have swelling in her bilateral knee joints.  Activities of Daily Living:  Patient reports morning stiffness for all day hours.   Patient Reports nocturnal pain.  Difficulty dressing/grooming: Denies Difficulty climbing stairs: Reports Difficulty getting out of chair: Reports Difficulty using hands for taps, buttons, cutlery, and/or writing: Denies   Review of Systems  Constitutional: Positive for fatigue. Negative for night sweats, weight gain and weight loss.  HENT: Negative for mouth sores, trouble swallowing, trouble swallowing, mouth dryness and nose dryness.   Eyes: Negative for pain, redness, visual disturbance and dryness.  Respiratory: Negative for cough, shortness of breath and difficulty breathing.   Cardiovascular: Negative for chest pain, palpitations, hypertension, irregular heartbeat and swelling in legs/feet.  Gastrointestinal: Negative for blood in stool, constipation and diarrhea.  Endocrine: Negative for increased urination.  Genitourinary: Negative for vaginal dryness.  Musculoskeletal: Positive for arthralgias, joint pain, joint swelling and morning stiffness. Negative for myalgias, muscle weakness, muscle tenderness and myalgias.  Skin: Negative for color change, rash, hair loss, skin tightness, ulcers and sensitivity to sunlight.  Allergic/Immunologic: Negative for susceptible to infections.  Neurological: Negative for dizziness, memory  loss, night sweats and weakness.  Hematological: Negative for swollen glands.  Psychiatric/Behavioral: Negative for depressed mood and sleep disturbance. The patient is not nervous/anxious.     PMFS History:  Patient Active Problem List   Diagnosis Date Noted  . Rheumatoid arthritis of multiple sites with negative rheumatoid factor ([place]) 12/24/2017  . Primary osteoarthritis of both hands 12/24/2017  . Primary osteoarthritis of both knees 12/24/2017  . Primary osteoarthritis of both feet 12/24/2017  . Osteoarthritis of both shoulders 12/24/2017  . Gastroesophageal reflux disease 08/30/2017  . GAD (generalized anxiety disorder) 09/05/2016  . Renal insufficiency 07/17/2013  . Suprapubic mass 08/20/2012  . Hypothyroidism   . UI (urinary incontinence)   . Stricture and stenosis of esophagus 04/11/2011  . Chest pain 03/29/2011  . Dyslipidemia 03/29/2011  . Hypertension 03/29/2011  . CAD (coronary artery disease) 03/29/2011  . Palpitations 03/29/2011    Past Medical History:  Diagnosis Date  . Anxiety state, unspecified   . Arthritis   . Atherosclerosis   . Diverticulosis   . Esophageal stricture   . Essential hypertension, benign   . History of DVT (deep vein thrombosis)    legs  . Inguinal hernia without mention of obstruction or gangrene, unilateral or unspecified, (not specified as recurrent)    2014  . Internal hemorrhoids   . Mild mitral regurgitation    a. 04/2010 Echo: nl EF, mild diast dysfxn, trace PR, mild TR, mild-mod MR.  . Non-obstructive CAD    a. 04/2010 Myoview: EF 77%, fixed apical/inferior defect;  b. 04/2010 Cath: LAD 1m LCX/RCA minor irregs, EF 60%;  c. 04/2011 Myoview: low risk.  . Pure hypercholesterolemia   . Scoliosis   .  UI (urinary incontinence)   . Umbilical hernia without mention of obstruction or gangrene    2014  . Unspecified hypothyroidism   . Vitamin D deficiency     Family History  Problem Relation Age of Onset  . Heart disease Mother    . Heart attack Mother   . Heart disease Father   . Heart attack Father   . Breast cancer Sister   . Breast cancer Maternal Aunt   . Heart disease Brother        sister   Past Surgical History:  Procedure Laterality Date  . ABDOMINAL HYSTERECTOMY  2008  . CARDIAC CATHETERIZATION  2011   [facility]  . [facility]COPY  2003   [facility]  . HERNIA REPAIR Right 09/23/2012   repair [facility]  . LAPAROSCOPIC HYSTERECTOMY    . TONSILLECTOMY     Social History   Social History Narrative  . Not on file     Objective: Vital Signs: BP (!) 148/75 (BP Location: Left Arm, Patient Position: Sitting, Cuff Size: Normal)   Pulse (!) 53   Resp 15   Ht 5' (1.524 m)   Wt 147 lb (66.7 kg)   BMI 28.71 kg/m    Physical Exam  Constitutional: She is oriented to person, place, and time. She appears well-developed and well-nourished.  HENT:  Head: Normocephalic and atraumatic.  Eyes: Conjunctivae and EOM are normal.  Neck: Normal range of motion.  Cardiovascular: Normal rate, regular rhythm, normal heart sounds and intact distal pulses.  Pulmonary/Chest: Effort normal and breath sounds normal.  Abdominal: Soft. Bowel sounds are normal.  Lymphadenopathy:    She has no cervical adenopathy.  Neurological: She is alert and oriented to person, place, and time.  Skin: Skin is warm and dry. Capillary refill takes less than 2 seconds.  Psychiatric: She has a normal mood and affect. Her behavior is normal.  Nursing note and vitals reviewed.    Musculoskeletal Exam: C-spine thoracic and lumbar spine limited range of motion.  She has limited range of motion of bilateral shoulder joints with right shoulder joint abduction limited to about 30 degrees.  Elbow joints were in good range of motion.  She has no synovitis over the hands she has bilateral ulnar deviation.  She has bilateral CMC arthritis.  She has Dupuytren's contracture in her right hand.  Hip joints had limited range of motion.  Her knee joints were  in good range of motion.  She had MTP subluxation and overcrowding of toes with no synovitis.  CDAI Exam: No CDAI exam completed.    Investigation: No additional findings. Nov 28, 2017 iron studies normal, RF negative, anti-CCP 43+, ESR 14, uric acid 4.9, ACE negative, magnesium normal, HLA-B27 negative, G6PD normal  Imaging: No results found.  Speciality Comments: No specialty comments available.    Procedures:  No procedures performed Allergies: Codeine   Assessment / Plan:     Visit Diagnoses: Rheumatoid arthritis of multiple sites with negative rheumatoid factor (HCC) - Positive anti-CCP, negative rheumatoid factor with erosive changes in carpal bones.  Patient does not have much synovitis on examination she continues to have arthralgias.  She has been having pain and discomfort in her bilateral knee joints.  I am uncertain how much it is coming from rheumatoid arthritis and how much from chondrocalcinosis.  We had detailed discussion regarding rheumatoid arthritis and different different treatment options.  Indications side effects contraindications of Plaquenil were discussed.  Handout was given and consent was taken.  She  has been advised to get a baseline eye examination.  She will get labs in a month and then every 3 months to monitor for drug toxicity.  We will call in Plaquenil 200 mg p.o. daily total 30-day supply with 2 refills was given.  Patient was counseled on the purpose, proper use, and adverse effects of hydroxychloroquine including nausea/diarrhea, skin rash, headaches, and sun sensitivity.  Discussed importance of annual eye exams while on hydroxychloroquine to monitor to ocular toxicity and discussed importance of frequent laboratory monitoring.  Provided patient with eye exam form for baseline ophthalmologic exam.  Provided patient with educational materials on hydroxychloroquine and answered all questions.  Patient consented to hydroxychloroquine.  Will upload consent  in the media tab.     High risk medication use-she will be starting Plaquenil.  Primary osteoarthritis of both hands-she has severe osteoarthritis and rheumatoid arthritis overlap.  Primary osteoarthritis of both knees - Severe osteoarthritis with severe chondromalacia patella and bilateral knees.  She had chondrocalcinosis in her knee joints.  She also has some warmth in her knee joints.  I offered cortisone injection to her right knee joint which she declined.  Primary osteoarthritis of both feet-she has overcrowding of chills due to osteoarthritis and rheumatoid arthritis overlap.  Osteoarthritis/rheumatoid arthritis of both shoulders, unspecified osteoarthritis type-she has limited range of motion of bilateral shoulder joints.  Chondrocalcinosis-I may consider adding colchicine at follow-up visit if she tolerates Plaquenil well.  Renal insufficiency-she has been advised not to take any anti-inflammatories.  Essential hypertension  Gastroesophageal reflux disease, esophagitis presence not specified  Dyslipidemia  Coronary artery disease involving native coronary artery of native heart without angina pectoris   History of DVT  Orders: Orders Placed This Encounter  Procedures  . CBC with Differential/Platelet  . COMPLETE METABOLIC PANEL WITH GFR   Meds ordered this encounter  Medications  . hydroxychloroquine (PLAQUENIL) 200 MG tablet    Sig: Take 1 tablet (200 mg total) by mouth daily.    Dispense:  30 tablet    Refill:  2    Face-to-face time spent with patient was 35 minutes.> 50% of time was spent in counseling and coordination of care.  Follow-Up Instructions: Return for Rheumatoid arthritis, Osteoarthritis.   Bo Merino, MD  Note - This record has been created using Editor, commissioning.  Chart creation errors have been sought, but may not always  have been located. Such creation errors do not reflect on  the standard of medical care.

## 2018-01-04 ENCOUNTER — Encounter: Payer: Self-pay | Admitting: Internal Medicine

## 2018-01-04 ENCOUNTER — Ambulatory Visit: Payer: PPO | Admitting: Internal Medicine

## 2018-01-04 ENCOUNTER — Ambulatory Visit: Payer: PPO | Admitting: Rheumatology

## 2018-01-04 ENCOUNTER — Encounter: Payer: Self-pay | Admitting: Rheumatology

## 2018-01-04 VITALS — BP 120/70 | HR 64 | Ht 60.0 in | Wt 147.0 lb

## 2018-01-04 VITALS — BP 148/75 | HR 53 | Resp 15 | Ht 60.0 in | Wt 147.0 lb

## 2018-01-04 DIAGNOSIS — M19071 Primary osteoarthritis, right ankle and foot: Secondary | ICD-10-CM

## 2018-01-04 DIAGNOSIS — K219 Gastro-esophageal reflux disease without esophagitis: Secondary | ICD-10-CM

## 2018-01-04 DIAGNOSIS — M19011 Primary osteoarthritis, right shoulder: Secondary | ICD-10-CM | POA: Diagnosis not present

## 2018-01-04 DIAGNOSIS — M0609 Rheumatoid arthritis without rheumatoid factor, multiple sites: Secondary | ICD-10-CM | POA: Diagnosis not present

## 2018-01-04 DIAGNOSIS — M170 Bilateral primary osteoarthritis of knee: Secondary | ICD-10-CM

## 2018-01-04 DIAGNOSIS — E785 Hyperlipidemia, unspecified: Secondary | ICD-10-CM | POA: Diagnosis not present

## 2018-01-04 DIAGNOSIS — M19041 Primary osteoarthritis, right hand: Secondary | ICD-10-CM | POA: Diagnosis not present

## 2018-01-04 DIAGNOSIS — R002 Palpitations: Secondary | ICD-10-CM

## 2018-01-04 DIAGNOSIS — I10 Essential (primary) hypertension: Secondary | ICD-10-CM

## 2018-01-04 DIAGNOSIS — N289 Disorder of kidney and ureter, unspecified: Secondary | ICD-10-CM

## 2018-01-04 DIAGNOSIS — M1120 Other chondrocalcinosis, unspecified site: Secondary | ICD-10-CM | POA: Diagnosis not present

## 2018-01-04 DIAGNOSIS — Z79899 Other long term (current) drug therapy: Secondary | ICD-10-CM

## 2018-01-04 DIAGNOSIS — I2510 Atherosclerotic heart disease of native coronary artery without angina pectoris: Secondary | ICD-10-CM | POA: Diagnosis not present

## 2018-01-04 DIAGNOSIS — M19012 Primary osteoarthritis, left shoulder: Secondary | ICD-10-CM

## 2018-01-04 DIAGNOSIS — M19072 Primary osteoarthritis, left ankle and foot: Secondary | ICD-10-CM

## 2018-01-04 DIAGNOSIS — M19042 Primary osteoarthritis, left hand: Secondary | ICD-10-CM

## 2018-01-04 MED ORDER — HYDROXYCHLOROQUINE SULFATE 200 MG PO TABS: 200.0000 mg | ORAL_TABLET | Freq: Every day | ORAL | 2 refills | Status: DC

## 2018-01-04 NOTE — Patient Instructions (Addendum)
Hydroxychloroquine tablets What is this medicine? HYDROXYCHLOROQUINE (hye drox ee KLOR oh kwin) is used to treat rheumatoid arthritis and systemic lupus erythematosus. It is also used to treat malaria. This medicine may be used for other purposes; ask your health care provider or pharmacist if you have questions. COMMON BRAND NAME(S): Plaquenil, Quineprox What should I tell my health care provider before I take this medicine? They need to know if you have any of these conditions: -diabetes -eye disease, vision problems -G6PD deficiency -history of blood diseases -history of irregular heartbeat -if you often drink alcohol -kidney disease -liver disease -porphyria -psoriasis -seizures -an unusual or allergic reaction to chloroquine, hydroxychloroquine, other medicines, foods, dyes, or preservatives -pregnant or trying to get pregnant -breast-feeding How should I use this medicine? Take this medicine by mouth with a glass of water. Follow the directions on the prescription label. Avoid taking antacids within 4 hours of taking this medicine. It is best to separate these medicines by at least 4 hours. Do not cut, crush or chew this medicine. You can take it with or without food. If it upsets your stomach, take it with food. Take your medicine at regular intervals. Do not take your medicine more often than directed. Take all of your medicine as directed even if you think you are better. Do not skip doses or stop your medicine early. Talk to your pediatrician regarding the use of this medicine in children. While this drug may be prescribed for selected conditions, precautions do apply. Overdosage: If you think you have taken too much of this medicine contact a poison control center or emergency room at once. NOTE: This medicine is only for you. Do not share this medicine with others. What if I miss a dose? If you miss a dose, take it as soon as you can. If it is almost time for your next dose,  take only that dose. Do not take double or extra doses. What may interact with this medicine? Do not take this medicine with any of the following medications: -cisapride -dofetilide -dronedarone -live virus vaccines -penicillamine -pimozide -thioridazine -ziprasidone This medicine may also interact with the following medications: -ampicillin -antacids -cimetidine -cyclosporine -digoxin -medicines for diabetes, like insulin, glipizide, glyburide -medicines for seizures like carbamazepine, phenobarbital, phenytoin -mefloquine -methotrexate -other medicines that prolong the QT interval (cause an abnormal heart rhythm) -praziquantel This list may not describe all possible interactions. Give your health care provider a list of all the medicines, herbs, non-prescription drugs, or dietary supplements you use. Also tell them if you smoke, drink alcohol, or use illegal drugs. Some items may interact with your medicine. What should I watch for while using this medicine? Tell your doctor or healthcare professional if your symptoms do not start to get better or if they get worse. Avoid taking antacids within 4 hours of taking this medicine. It is best to separate these medicines by at least 4 hours. Tell your doctor or health care professional right away if you have any change in your eyesight. Your vision and blood may be tested before and during use of this medicine. This medicine can make you more sensitive to the sun. Keep out of the sun. If you cannot avoid being in the sun, wear protective clothing and use sunscreen. Do not use sun lamps or tanning beds/booths. What side effects may I notice from receiving this medicine? Side effects that you should report to your doctor or health care professional as soon as possible: -allergic reactions like skin rash,   itching or hives, swelling of the face, lips, or tongue -changes in vision -decreased hearing or ringing of the ears -redness,  blistering, peeling or loosening of the skin, including inside the mouth -seizures -sensitivity to light -signs and symptoms of a dangerous change in heartbeat or heart rhythm like chest pain; dizziness; fast or irregular heartbeat; palpitations; feeling faint or lightheaded, falls; breathing problems -signs and symptoms of liver injury like dark yellow or brown urine; general ill feeling or flu-like symptoms; light-colored stools; loss of appetite; nausea; right upper belly pain; unusually weak or tired; yellowing of the eyes or skin -signs and symptoms of low blood sugar such as feeling anxious; confusion; dizziness; increased hunger; unusually weak or tired; sweating; shakiness; cold; irritable; headache; blurred vision; fast heartbeat; loss of consciousness -uncontrollable head, mouth, neck, arm, or leg movements Side effects that usually do not require medical attention (report to your doctor or health care professional if they continue or are bothersome): -anxious -diarrhea -dizziness -hair loss -headache -irritable -loss of appetite -nausea, vomiting -stomach pain This list may not describe all possible side effects. Call your doctor for medical advice about side effects. You may report side effects to FDA at 1-800-FDA-1088. Where should I keep my medicine? Keep out of the reach of children. In children, this medicine can cause overdose with small doses. Store at room temperature between 15 and 30 degrees C (59 and 86 degrees F). Protect from moisture and light. Throw away any unused medicine after the expiration date. NOTE: This sheet is a summary. It may not cover all possible information. If you have questions about this medicine, talk to your doctor, pharmacist, or health care provider.  2018 Elsevier/Gold Standard (2016-02-09 14:16:15)  Standing Labs We placed an order today for your standing lab work.    Please come back and get your standing labs in 1 month, then in 3  months, and then every 5 month  We have open lab Monday through Friday from 8:30-11:30 AM and 1:30-4:00 PM  at the office of Dr. Bo Merino.   You may experience shorter wait times on Monday and Friday afternoons. The office is located at [address], [place], [place], [place] [address] No appointment is necessary.   Labs are drawn by [facility].  You may receive a bill from [place] [facility] for your lab work. If you have any questions regarding directions or hours of operation,  please call [phone].

## 2018-01-04 NOTE — Patient Instructions (Signed)
Medication Instructions: Your physician recommends that you continue on your current medications as directed. Please refer to the Current Medication list given to you today.   Labwork: None Ordered   Testing/Procedures: None ordered    Follow-Up: Follow up as needed no appointment scheduled    Any Other Special Instructions Will Be Listed Below (If Applicable).     If you need a refill on your cardiac medications before your next appointment, please call your pharmacy.

## 2018-01-04 NOTE — Progress Notes (Signed)
Cardiology Office Note   Date:  01/04/2018   ID:  Kathleen Paul, DOB [birth year], MRN [id number]  PCP:  [name], NP  Cardiologist:   Dorris Carnes, MD   F/U of  Palpitatoins, HTN and CAD     History of Present Illness: Kathleen Paul is a 82 y.o. female with a history of mild CAD by cath in 2011 (50% mid LAD)HTN , palpitations, fatigue, low blood pressureand LE edema   I saw her back in 2012  Since she has been seen by Johnny Bridge and then Angelica Ran  Last in 2015  Since I saw her she wore an event monitor   THis showed  Rare PACs    The pt denies palpitations at present   Says she thinks spelll in past was related to stressThis has improved  The pt denies CP  Diagnosed today with RA   Started on Plaquenil          Current Meds  Medication Sig  . aspirin EC 81 MG tablet Take by mouth.  . Biotin 1000 MCG tablet Take 1,000 mcg by mouth daily.  . Calcium Carbonate-Vitamin D 600-200 MG-UNIT TABS Take by mouth.  . enalapril-hydrochlorothiazide (VASERETIC) 10-25 MG tablet Take 1 tablet by mouth daily.  . hydroxychloroquine (PLAQUENIL) 200 MG tablet Take 1 tablet (200 mg total) by mouth daily.  Marland Kitchen levothyroxine (SYNTHROID, LEVOTHROID) 88 MCG tablet TAKE 1 TABLET BY MOUTH EVERY MORNING ON AN EMPTY STOMACH AND WITH A FULL GLASS OF WATER  . metoprolol tartrate (LOPRESSOR) 25 MG tablet Every evening  . Multiple Vitamin (ONE-A-DAY ESSENTIAL) TABS Take by mouth.  . ranitidine (ZANTAC) 150 MG tablet Take 1 tablet (150 mg total) by mouth at bedtime.  . simvastatin (ZOCOR) 40 MG tablet Take 1 tablet (40 mg total) by mouth at bedtime.     Allergies:   Codeine   Past Medical History:  Diagnosis Date  . Anxiety state, unspecified   . Arthritis   . Atherosclerosis   . Diverticulosis   . Esophageal stricture   . Essential hypertension, benign   . History of DVT (deep vein thrombosis)    legs  . Inguinal hernia without mention of obstruction or gangrene, unilateral or  unspecified, (not specified as recurrent)    2014  . Internal hemorrhoids   . Mild mitral regurgitation    a. 04/2010 Echo: nl EF, mild diast dysfxn, trace PR, mild TR, mild-mod MR.  . Non-obstructive CAD    a. 04/2010 Myoview: EF 77%, fixed apical/inferior defect;  b. 04/2010 Cath: LAD 46m, LCX/RCA minor irregs, EF 60%;  c. 04/2011 Myoview: low risk.  . Pure hypercholesterolemia   . Scoliosis   . UI (urinary incontinence)   . Umbilical hernia without mention of obstruction or gangrene    2014  . Unspecified hypothyroidism   . Vitamin D deficiency     Past Surgical History:  Procedure Laterality Date  . ABDOMINAL HYSTERECTOMY  2008  . CARDIAC CATHETERIZATION  2011   [facility]  . COLONOSCOPY  2003   [facility]  . HERNIA REPAIR Right 09/23/2012   repair [facility]  . LAPAROSCOPIC HYSTERECTOMY    . TONSILLECTOMY       Social History:  The patient  reports that she quit smoking about 29 years ago. Her smoking use included cigarettes. She has a 15.00 pack-year smoking history. She has never used smokeless tobacco. She reports that she does not drink alcohol or use drugs.   Family  History:  The patient's family history includes Breast cancer in her maternal aunt and sister; Heart attack in her father and mother; Heart disease in her brother, father, and mother.    ROS:  Please see the history of present illness. All other systems are reviewed and  Negative to the above problem except as noted.    PHYSICAL EXAM: VS:  BP 120/70   Pulse 64   Ht 5' (1.524 m)   Wt 66.7 kg (147 lb)   BMI 28.71 kg/m   GEN: Well nourished, well developed, in no acute distress  HEENT: normal  Neck: no JVD, carotid bruits, or masses Cardiac: RRR; no murmurs, rubs, or gallops,no edema  Respiratory:  clear to auscultation bilaterally, normal work of breathing GI: soft, nontender, nondistended, + BS  No hepatomegaly  MS: Jt deformities of RA   Skin: warm and dry, no rash Neuro:  Strength and sensation  are intact Psych: euthymic mood, full affect   EKG:  EKG is not ordered today.    Lipid Panel    Component Value Date/Time   CHOL 127 08/30/2017 0855   TRIG 70.0 08/30/2017 0855   HDL 57.10 08/30/2017 0855   CHOLHDL 2 08/30/2017 0855   VLDL 14.0 08/30/2017 0855   LDLCALC 56 08/30/2017 0855   LDLDIRECT 180.2 07/17/2013 0828      Wt Readings from Last 3 Encounters:  01/04/18 66.7 kg (147 lb)  01/04/18 66.7 kg (147 lb)  11/28/17 65.3 kg (144 lb)      ASSESSMENT AND PLAN:  1  Papitations  Pt denies problems  2  HTN  BP OK  3  CAD   Mild at cath in 2011   Asymptomatic   4  HL   LDL was 72 in Feb   Continue meds    5   RA  On Plaquenil   Will arrange f/u in 1 year      Will arrange f/u in 23yr   Current medicines are reviewed at length with the patient today.  The patient does not have concerns regarding medicines.  Signed, Dorris Carnes, MD  01/04/2018 12:11 PM    [facility] [address], [place], [place]  [address] Phone: [phone]; Fax: [phone]   ++

## 2018-01-15 NOTE — Progress Notes (Signed)
86m recall put in Epic/mw

## 2018-01-29 DIAGNOSIS — Z79899 Other long term (current) drug therapy: Secondary | ICD-10-CM | POA: Diagnosis not present

## 2018-01-29 DIAGNOSIS — M05741 Rheumatoid arthritis with rheumatoid factor of right hand without organ or systems involvement: Secondary | ICD-10-CM | POA: Diagnosis not present

## 2018-01-29 DIAGNOSIS — H2513 Age-related nuclear cataract, bilateral: Secondary | ICD-10-CM | POA: Diagnosis not present

## 2018-01-31 ENCOUNTER — Other Ambulatory Visit: Payer: Self-pay

## 2018-01-31 DIAGNOSIS — Z79899 Other long term (current) drug therapy: Secondary | ICD-10-CM | POA: Diagnosis not present

## 2018-01-31 LAB — CBC WITH DIFFERENTIAL/PLATELET
BASOS ABS: 38 {cells}/uL (ref 0–200)
Basophils Relative: 0.8 %
Eosinophils Absolute: 82 cells/uL (ref 15–500)
Eosinophils Relative: 1.7 %
HCT: 29.6 % — ABNORMAL LOW (ref 35.0–45.0)
Hemoglobin: 9.7 g/dL — ABNORMAL LOW (ref 11.7–15.5)
Lymphs Abs: 797 cells/uL — ABNORMAL LOW (ref 850–3900)
MCH: 31.3 pg (ref 27.0–33.0)
MCHC: 32.8 g/dL (ref 32.0–36.0)
MCV: 95.5 fL (ref 80.0–100.0)
MPV: 10.4 fL (ref 7.5–12.5)
Monocytes Relative: 13 %
NEUTROS PCT: 67.9 %
Neutro Abs: 3259 cells/uL (ref 1500–7800)
Platelets: 229 10*3/uL (ref 140–400)
RBC: 3.1 10*6/uL — ABNORMAL LOW (ref 3.80–5.10)
RDW: 12.1 % (ref 11.0–15.0)
TOTAL LYMPHOCYTE: 16.6 %
WBC: 4.8 10*3/uL (ref 3.8–10.8)
WBCMIX: 624 {cells}/uL (ref 200–950)

## 2018-01-31 LAB — COMPLETE METABOLIC PANEL WITH GFR
AG Ratio: 1.9 (calc) (ref 1.0–2.5)
ALKALINE PHOSPHATASE (APISO): 65 U/L (ref 33–130)
ALT: 9 U/L (ref 6–29)
AST: 15 U/L (ref 10–35)
Albumin: 4.1 g/dL (ref 3.6–5.1)
BILIRUBIN TOTAL: 0.8 mg/dL (ref 0.2–1.2)
BUN/Creatinine Ratio: 24 (calc) — ABNORMAL HIGH (ref 6–22)
BUN: 31 mg/dL — ABNORMAL HIGH (ref 7–25)
CO2: 24 mmol/L (ref 20–32)
Calcium: 8.8 mg/dL (ref 8.6–10.4)
Chloride: 103 mmol/L (ref 98–110)
Creat: 1.28 mg/dL — ABNORMAL HIGH (ref 0.60–0.88)
GFR, Est African American: 46 mL/min/{1.73_m2} — ABNORMAL LOW (ref >=60)
GFR, Est Non African American: 39 mL/min/{1.73_m2} — ABNORMAL LOW (ref >=60)
Globulin: 2.2 g/dL (calc) (ref 1.9–3.7)
Glucose, Bld: 79 mg/dL (ref 65–99)
Potassium: 4.6 mmol/L (ref 3.5–5.3)
SODIUM: 137 mmol/L (ref 135–146)
Total Protein: 6.3 g/dL (ref 6.1–8.1)

## 2018-02-04 ENCOUNTER — Ambulatory Visit: Payer: PPO | Admitting: Podiatry

## 2018-02-04 ENCOUNTER — Other Ambulatory Visit: Payer: Self-pay | Admitting: Primary Care

## 2018-02-04 ENCOUNTER — Encounter: Payer: Self-pay | Admitting: Podiatry

## 2018-02-04 DIAGNOSIS — B351 Tinea unguium: Secondary | ICD-10-CM

## 2018-02-04 DIAGNOSIS — M79676 Pain in unspecified toe(s): Secondary | ICD-10-CM | POA: Diagnosis not present

## 2018-02-04 DIAGNOSIS — M170 Bilateral primary osteoarthritis of knee: Secondary | ICD-10-CM

## 2018-02-04 DIAGNOSIS — D649 Anemia, unspecified: Secondary | ICD-10-CM

## 2018-02-04 NOTE — Progress Notes (Signed)
Creatinine and GFR have mildly improved. Please advise patient to continue to avoid NSAIDs.  CBC reveals anemia.  Please forward results to PCP.

## 2018-02-04 NOTE — Progress Notes (Signed)
Complaint:  Visit Type: Patient returns to my office for continued preventative foot care services. Complaint: Patient states" my nails have grown long and thick and become painful to walk and wear shoes"  The patient presents for preventative foot care services. No changes to ROS. Patient has painful callus right foot.   Podiatric Exam: Vascular: dorsalis pedis and posterior tibial pulses are palpable bilateral. Capillary return is immediate. Temperature gradient is WNL. Skin turgor WNL  Sensorium: Normal Semmes Weinstein monofilament test. Normal tactile sensation bilaterally. Nail Exam: Pt has thick disfigured discolored nails with subungual debris noted bilateral entire nail hallux through fifth toenails Ulcer Exam: There is no evidence of ulcer or pre-ulcerative changes or infection. Orthopedic Exam: Muscle tone and strength are WNL. No limitations in general ROM. No crepitus or effusions noted. Foot type and digits show no abnormalities. 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 Skin: No Porokeratosis. No infection or ulcers.  [place] durum 5th toe right asymptomatic.  Porokeratosis sub 5th met B/L asymptomatic.    Diagnosis:  Onychomycosis, , Pain in right toe, pain in left toes    Treatment & Plan Procedures and Treatment: Consent by patient was obtained for treatment procedures. The patient understood the discussion of treatment and procedures well. All questions were answered thoroughly reviewed. Debridement of mycotic and hypertrophic toenails, 1 through 5 bilateral and clearing of subungual debris. No ulceration, no infection noted. Return Visit-Office Procedure: Patient instructed to return to the office for a follow up visit 3 months for continued evaluation and treatment.    Gardiner Barefoot DPM

## 2018-02-04 NOTE — Telephone Encounter (Signed)
Please notify patient that I received a copy of her labs that were drawn by her Rheumatologist.  Anemia could be secondary to CKD, but need to rule out other causes. She has chronic anemia, but does she ever notice rectal bleeding? Vaginal bleeding?  Does she take oral iron pills? Has she ever?  If not then recommend she start ferrous sulfate 325 mg daily with repeat CBC and add IBC panel in 6 weeks. Please schedule.

## 2018-02-05 NOTE — Telephone Encounter (Signed)
Message left for patient to return my call.  

## 2018-02-13 MED ORDER — DICLOFENAC SODIUM 1 % TD GEL: 2.0000 g | Freq: Three times a day (TID) | TRANSDERMAL | 0 refills | Status: DC | PRN

## 2018-02-13 NOTE — Telephone Encounter (Signed)
Noted.  Refill sent to pharmacy. 

## 2018-02-13 NOTE — Telephone Encounter (Addendum)
Spoken and notified patient of Kathleen Paul comments.   Patient stated that no rectal bleeding and no vaginal bleeding.  No, she have not been taking iron pills.  Lab appt on 03/26/2018  Also patient is requesting if [name] can send diclofenac gel to [facility]. She had to start using it again. She is having pain again and this has been helping.

## 2018-02-14 ENCOUNTER — Other Ambulatory Visit: Payer: Self-pay | Admitting: Primary Care

## 2018-02-14 DIAGNOSIS — E039 Hypothyroidism, unspecified: Secondary | ICD-10-CM

## 2018-03-22 NOTE — Progress Notes (Signed)
Office Visit Note  Patient: Kathleen Paul             Date of Birth: 1936-01-24           MRN: [id number]             PCP: [name], NP Referring: [name], NP Visit Date: 04/05/2018 Occupation: @GUAROCC @  Subjective:  Joint stiffness.   History of Present Illness: Kathleen Paul is a 82 y.o. female accompanied by her daughter today.  She states she has been having a lot of pain and discomfort.  She tried Plaquenil since the last visit and has not noticed any improvement in her symptoms.  She states the only medication which helps her is diclofenac.  She has tried Tylenol and ibuprofen without any benefit.  She has been having pain and stiffness in multiple joints.  She denies any joint swelling.  Activities of Daily Living:  Patient reports morning stiffness for 24 hours.   Patient Reports nocturnal pain.  Difficulty dressing/grooming: Denies Difficulty climbing stairs: Reports Difficulty getting out of chair: Reports Difficulty using hands for taps, buttons, cutlery, and/or writing: Reports  Review of Systems  Constitutional: Positive for fatigue.  HENT: Negative for mouth sores and mouth dryness.   Eyes: Positive for dryness. Negative for pain.  Respiratory: Negative for shortness of breath and difficulty breathing.   Cardiovascular: Negative for chest pain and swelling in legs/feet.  Gastrointestinal: Negative for abdominal pain, constipation, diarrhea, nausea and vomiting.  Endocrine: Negative for increased urination.  Genitourinary: Negative for nocturia and pelvic pain.  Musculoskeletal: Positive for arthralgias, joint pain and morning stiffness. Negative for joint swelling.  Skin: Negative for rash and hair loss.  Allergic/Immunologic: Negative for susceptible to infections.  Neurological: Positive for weakness. Negative for dizziness, light-headedness, headaches and memory loss.  Hematological: Negative for bruising/bleeding tendency.    Psychiatric/Behavioral: Negative for confusion.    PMFS History:  Patient Active Problem List   Diagnosis Date Noted  . Food impaction of esophagus 04/02/2018  . Primary osteoarthritis of both hands 12/24/2017  . Primary osteoarthritis of both knees 12/24/2017  . Primary osteoarthritis of both feet 12/24/2017  . Osteoarthritis of both shoulders 12/24/2017  . Gastroesophageal reflux disease 08/30/2017  . GAD (generalized anxiety disorder) 09/05/2016  . Renal insufficiency 07/17/2013  . Suprapubic mass 08/20/2012  . Hypothyroidism   . UI (urinary incontinence)   . Stricture and stenosis of esophagus 04/11/2011  . Chest pain 03/29/2011  . Dyslipidemia 03/29/2011  . Hypertension 03/29/2011  . CAD (coronary artery disease) 03/29/2011  . Palpitations 03/29/2011    Past Medical History:  Diagnosis Date  . Anxiety state, unspecified   . Arthritis   . Atherosclerosis   . Diverticulosis   . Esophageal stricture   . Essential hypertension, benign   . History of DVT (deep vein thrombosis)    legs  . Inguinal hernia without mention of obstruction or gangrene, unilateral or unspecified, (not specified as recurrent)    2014  . Internal hemorrhoids   . Mild mitral regurgitation    a. 04/2010 Echo: nl EF, mild diast dysfxn, trace PR, mild TR, mild-mod MR.  . Non-obstructive CAD    a. 04/2010 Myoview: EF 77%, fixed apical/inferior defect;  b. 04/2010 Cath: LAD 58m, LCX/RCA minor irregs, EF 60%;  c. 04/2011 Myoview: low risk.  . Pure hypercholesterolemia   . Scoliosis   . UI (urinary incontinence)   . Umbilical hernia without mention of obstruction or gangrene  2014  . Unspecified hypothyroidism   . Vitamin D deficiency     Family History  Problem Relation Age of Onset  . Heart disease Mother   . Heart attack Mother   . Heart disease Father   . Heart attack Father   . Breast cancer Sister   . Breast cancer Maternal Aunt   . Heart disease Brother        sister   Past  Surgical History:  Procedure Laterality Date  . ABDOMINAL HYSTERECTOMY  2008  . BIOPSY  04/02/2018   Procedure: BIOPSY;  Surgeon: Rush Landmark Telford Nab., MD;  Location: [facility];  Service: Gastroenterology;;  . CARDIAC CATHETERIZATION  2011   [facility]  . COLONOSCOPY  2003   [facility]  . ESOPHAGOGASTRODUODENOSCOPY (EGD) WITH PROPOFOL N/A 04/02/2018   Procedure: ESOPHAGOGASTRODUODENOSCOPY (EGD) WITH PROPOFOL;  Surgeon: Rush Landmark Telford Nab., MD;  Location: [facility];  Service: Gastroenterology;  Laterality: N/A;  . FOREIGN BODY REMOVAL  04/02/2018   Procedure: FOREIGN BODY REMOVAL;  Surgeon: Rush Landmark Telford Nab., MD;  Location: [facility];  Service: Gastroenterology;;  . HERNIA REPAIR Right 09/23/2012   repair [facility]  . LAPAROSCOPIC HYSTERECTOMY    . TONSILLECTOMY     Social History   Social History Narrative  . Not on file    Objective: Vital Signs: BP (!) 141/75 (BP Location: Left Arm, Patient Position: Sitting, Cuff Size: Normal)   Pulse (!) 54   Resp 14   Ht 5' (1.524 m)   Wt 150 lb 6.4 oz (68.2 kg)   BMI 29.37 kg/m    Physical Exam  Constitutional: She is oriented to person, place, and time. She appears well-developed and well-nourished.  HENT:  Head: Normocephalic and atraumatic.  Eyes: Conjunctivae and EOM are normal.  Neck: Normal range of motion.  Cardiovascular: Normal rate, regular rhythm, normal heart sounds and intact distal pulses.  Pulmonary/Chest: Effort normal and breath sounds normal.  Abdominal: Soft. Bowel sounds are normal.  Lymphadenopathy:    She has no cervical adenopathy.  Neurological: She is alert and oriented to person, place, and time.  Skin: Skin is warm and dry. Capillary refill takes less than 2 seconds.  Psychiatric: She has a normal mood and affect. Her behavior is normal.  Nursing note and vitals reviewed.    Musculoskeletal Exam: C-spine thoracic spine good range of motion.  Right shoulder joint abduction was limited to  only 30 degrees.  Elbow joints wrist joint MCPs PIPs DIPs were in good range of motion.  She has DIP and PIP thickening in her hands and feet with overcrowding of her toes.  She is crepitus in her bilateral knee joints.  No synovitis was noted.  CDAI Exam: CDAI Score: Not documented Patient Global Assessment: Not documented; Provider Global Assessment: Not documented Swollen: 0 ; Tender: 0  Joint Exam   Not documented   There is currently no information documented on the homunculus. Go to the Rheumatology activity and complete the homunculus joint exam.  Investigation: No additional findings.  Imaging: Dg Chest 2 View  Result Date: 04/02/2018 CLINICAL DATA:  Food impaction of esophagus. EXAM: CHEST - 2 VIEW COMPARISON:  None. FINDINGS: The heart size and mediastinal contours are within normal limits. Both lungs are clear. No pneumothorax or pleural effusion is noted. Narrowing of right subacromial space is noted suggesting rotator cuff injury. Significant degenerative changes seen involving the right glenohumeral joint. IMPRESSION: No active cardiopulmonary disease. Electronically Signed   By: Marijo Conception, M.D.  On: 04/02/2018 16:14    Recent Labs: Lab Results  Component Value Date   WBC 4.2 03/26/2018   HGB 11.6 (L) 04/02/2018   PLT 213.0 03/26/2018   NA 144 04/02/2018   K 3.9 04/02/2018   CL 109 04/02/2018   CO2 24 01/31/2018   GLUCOSE 94 04/02/2018   BUN 42 (H) 04/02/2018   CREATININE 1.70 (H) 04/02/2018   BILITOT 0.8 01/31/2018   ALKPHOS 58 08/30/2017   AST 15 01/31/2018   ALT 9 01/31/2018   PROT 6.3 01/31/2018   ALBUMIN 4.0 08/30/2017   CALCIUM 8.8 01/31/2018   GFRAA 46 (L) 01/31/2018    Speciality Comments: PLQ Eye Exam: 01/29/18 WNL @ [facility]  Procedures:  No procedures performed Allergies: Codeine   Assessment / Plan:     Visit Diagnoses: Primary osteoarthritis of both hands -she has DIP and PIP thickening consistent with osteoarthritis.  She  is anti -CCP +.  Her RF was negative.  She was having increase arthralgias we gave a trial of Plaquenil but she did not notice any improvement.  She had no synovitis on examination last visit and none today.  I have advised her to discontinue Plaquenil.  Chondrocalcinosis-she had chondrocalcinosis on the x-rays.  I believe the inflammatory changes in her hands were also consistent with chondrocalcinosis.  She has been taking diclofenac which has been helping her with chondrocalcinosis and osteoarthritis.  She wants to go back on diclofenac.  She has elevated creatinine.  I have advised her to follow closely with her PCP regarding diclofenac and monitoring of her kidney functions.  Primary osteoarthritis of both knees-she has chronic discomfort in her knee joints due to osteoarthritis.  No warmth or swelling was noted.  I offered cortisone injection which she declined.  Primary osteoarthritis of both feet-she has overcrowding of toes for which proper fitting shoes were discussed.  Primary osteoarthritis of both shoulders-she has very limited range of motion of her right shoulder and some limitation of the left shoulder.  Renal insufficiency-her creatinine is elevated.  Other medical problems are listed as follows:  History of DVT (deep vein thrombosis)  GAD (generalized anxiety disorder)  History of coronary artery disease  Palpitations  Stricture and stenosis of esophagus  History of gastroesophageal reflux (GERD)  History of diverticulosis  Dyslipidemia  Essential hypertension   Orders: No orders of the defined types were placed in this encounter.  No orders of the defined types were placed in this encounter.   Face-to-face time spent with patient was 30 minutes. Greater than 50% of time was spent in counseling and coordination of care.  Follow-Up Instructions: Return if symptoms worsen or fail to improve, for Osteoarthritis.   Bo Merino, MD  Note - This record  has been created using Editor, commissioning.  Chart creation errors have been sought, but may not always  have been located. Such creation errors do not reflect on  the standard of medical care.

## 2018-03-26 ENCOUNTER — Other Ambulatory Visit (INDEPENDENT_AMBULATORY_CARE_PROVIDER_SITE_OTHER): Payer: PPO

## 2018-03-26 DIAGNOSIS — D649 Anemia, unspecified: Secondary | ICD-10-CM | POA: Diagnosis not present

## 2018-03-26 LAB — CBC
HCT: 30 % — ABNORMAL LOW (ref 36.0–46.0)
Hemoglobin: 10.3 g/dL — ABNORMAL LOW (ref 12.0–15.0)
MCHC: 34.2 g/dL (ref 30.0–36.0)
MCV: 94.2 fl (ref 78.0–100.0)
Platelets: 213 10*3/uL (ref 150.0–400.0)
RBC: 3.19 Mil/uL — ABNORMAL LOW (ref 3.87–5.11)
RDW: 14.1 % (ref 11.5–15.5)
WBC: 4.2 10*3/uL (ref 4.0–10.5)

## 2018-03-26 LAB — IBC PANEL
Iron: 91 ug/dL (ref 42–145)
SATURATION RATIOS: 24.3 % (ref 20.0–50.0)
TRANSFERRIN: 268 mg/dL (ref 212.0–360.0)

## 2018-04-02 ENCOUNTER — Telehealth: Payer: Self-pay | Admitting: Internal Medicine

## 2018-04-02 ENCOUNTER — Encounter (HOSPITAL_COMMUNITY)
Admission: EM | Disposition: A | Discharge status: Home / Self Care | Payer: Self-pay | Source: Home / Self Care | Attending: Emergency Medicine

## 2018-04-02 ENCOUNTER — Encounter (HOSPITAL_COMMUNITY): Payer: Self-pay | Admitting: Certified Registered"

## 2018-04-02 ENCOUNTER — Encounter (HOSPITAL_COMMUNITY): Payer: Self-pay | Admitting: Emergency Medicine

## 2018-04-02 ENCOUNTER — Emergency Department (HOSPITAL_COMMUNITY)
Admission: EM | Admit: 2018-04-02 | Discharge: 2018-04-02 | Disposition: A | Discharge status: Home / Self Care | Payer: PPO | Attending: Emergency Medicine | Admitting: Emergency Medicine

## 2018-04-02 ENCOUNTER — Other Ambulatory Visit: Payer: Self-pay | Admitting: Gastroenterology

## 2018-04-02 ENCOUNTER — Emergency Department (HOSPITAL_COMMUNITY): Payer: PPO

## 2018-04-02 ENCOUNTER — Other Ambulatory Visit: Payer: Self-pay

## 2018-04-02 DIAGNOSIS — E039 Hypothyroidism, unspecified: Secondary | ICD-10-CM | POA: Insufficient documentation

## 2018-04-02 DIAGNOSIS — Z79890 Hormone replacement therapy (postmenopausal): Secondary | ICD-10-CM | POA: Insufficient documentation

## 2018-04-02 DIAGNOSIS — Z7982 Long term (current) use of aspirin: Secondary | ICD-10-CM | POA: Diagnosis not present

## 2018-04-02 DIAGNOSIS — Z86718 Personal history of other venous thrombosis and embolism: Secondary | ICD-10-CM | POA: Diagnosis not present

## 2018-04-02 DIAGNOSIS — M1990 Unspecified osteoarthritis, unspecified site: Secondary | ICD-10-CM | POA: Diagnosis not present

## 2018-04-02 DIAGNOSIS — Y999 Unspecified external cause status: Secondary | ICD-10-CM | POA: Insufficient documentation

## 2018-04-02 DIAGNOSIS — E559 Vitamin D deficiency, unspecified: Secondary | ICD-10-CM | POA: Diagnosis not present

## 2018-04-02 DIAGNOSIS — X58XXXA Exposure to other specified factors, initial encounter: Secondary | ICD-10-CM | POA: Insufficient documentation

## 2018-04-02 DIAGNOSIS — K259 Gastric ulcer, unspecified as acute or chronic, without hemorrhage or perforation: Secondary | ICD-10-CM | POA: Insufficient documentation

## 2018-04-02 DIAGNOSIS — Z79899 Other long term (current) drug therapy: Secondary | ICD-10-CM | POA: Insufficient documentation

## 2018-04-02 DIAGNOSIS — I10 Essential (primary) hypertension: Secondary | ICD-10-CM | POA: Insufficient documentation

## 2018-04-02 DIAGNOSIS — T18128A Food in esophagus causing other injury, initial encounter: Secondary | ICD-10-CM

## 2018-04-02 DIAGNOSIS — Z87891 Personal history of nicotine dependence: Secondary | ICD-10-CM | POA: Diagnosis not present

## 2018-04-02 DIAGNOSIS — E7800 Pure hypercholesterolemia, unspecified: Secondary | ICD-10-CM | POA: Diagnosis not present

## 2018-04-02 DIAGNOSIS — Y939 Activity, unspecified: Secondary | ICD-10-CM | POA: Diagnosis not present

## 2018-04-02 DIAGNOSIS — K2950 Unspecified chronic gastritis without bleeding: Secondary | ICD-10-CM | POA: Diagnosis not present

## 2018-04-02 DIAGNOSIS — W44F3XA Food entering into or through a natural orifice, initial encounter: Secondary | ICD-10-CM

## 2018-04-02 DIAGNOSIS — K222 Esophageal obstruction: Secondary | ICD-10-CM

## 2018-04-02 DIAGNOSIS — K210 Gastro-esophageal reflux disease with esophagitis: Secondary | ICD-10-CM | POA: Insufficient documentation

## 2018-04-02 DIAGNOSIS — Y929 Unspecified place or not applicable: Secondary | ICD-10-CM | POA: Insufficient documentation

## 2018-04-02 DIAGNOSIS — K449 Diaphragmatic hernia without obstruction or gangrene: Secondary | ICD-10-CM | POA: Diagnosis not present

## 2018-04-02 HISTORY — PX: ESOPHAGOGASTRODUODENOSCOPY (EGD) WITH PROPOFOL: SHX5813

## 2018-04-02 HISTORY — PX: BIOPSY: SHX5522

## 2018-04-02 HISTORY — PX: FOREIGN BODY REMOVAL: SHX962

## 2018-04-02 LAB — I-STAT CHEM 8, ED
BUN: 42 mg/dL — ABNORMAL HIGH (ref 8–23)
CALCIUM ION: 1.18 mmol/L (ref 1.15–1.40)
CHLORIDE: 109 mmol/L (ref 98–111)
Creatinine, Ser: 1.7 mg/dL — ABNORMAL HIGH (ref 0.44–1.00)
GLUCOSE: 94 mg/dL (ref 70–99)
HCT: 34 % — ABNORMAL LOW (ref 36.0–46.0)
Hemoglobin: 11.6 g/dL — ABNORMAL LOW (ref 12.0–15.0)
POTASSIUM: 3.9 mmol/L (ref 3.5–5.1)
Sodium: 144 mmol/L (ref 135–145)
TCO2: 21 mmol/L — ABNORMAL LOW (ref 22–32)

## 2018-04-02 SURGERY — ESOPHAGOGASTRODUODENOSCOPY (EGD) WITH PROPOFOL
Anesthesia: Monitor Anesthesia Care

## 2018-04-02 MED ORDER — OMEPRAZOLE 40 MG PO CPDR: 40.0000 mg | DELAYED_RELEASE_CAPSULE | Freq: Two times a day (BID) | ORAL | 3 refills | Status: DC

## 2018-04-02 MED ORDER — SODIUM CHLORIDE 0.9 % IV SOLN
INTRAVENOUS | Status: DC
  Administered 2018-04-02: 125 mL/h via INTRAVENOUS

## 2018-04-02 MED ORDER — SODIUM CHLORIDE 0.9 % IV SOLN: INTRAVENOUS | Status: DC

## 2018-04-02 MED ORDER — FENTANYL CITRATE (PF) 100 MCG/2ML IJ SOLN
INTRAMUSCULAR | Status: AC
  Filled 2018-04-02: qty 2

## 2018-04-02 MED ORDER — MIDAZOLAM HCL 5 MG/ML IJ SOLN
INTRAMUSCULAR | Status: AC
  Filled 2018-04-02: qty 2

## 2018-04-02 MED ORDER — MIDAZOLAM HCL 5 MG/5ML IJ SOLN
INTRAMUSCULAR | Status: DC | PRN
  Administered 2018-04-02: 1 mg via INTRAVENOUS
  Administered 2018-04-02: 2 mg via INTRAVENOUS
  Administered 2018-04-02: 1 mg via INTRAVENOUS

## 2018-04-02 MED ORDER — BUTAMBEN-TETRACAINE-BENZOCAINE 2-2-14 % EX AERO
INHALATION_SPRAY | CUTANEOUS | Status: DC | PRN
  Administered 2018-04-02: 2 via TOPICAL

## 2018-04-02 MED ORDER — SODIUM CHLORIDE 0.9 % IV BOLUS
1000.0000 mL | Freq: Once | INTRAVENOUS | Status: AC
  Administered 2018-04-02: 1000 mL via INTRAVENOUS

## 2018-04-02 MED ORDER — FENTANYL CITRATE (PF) 100 MCG/2ML IJ SOLN
INTRAMUSCULAR | Status: DC | PRN
  Administered 2018-04-02 (×3): 25 ug via INTRAVENOUS

## 2018-04-02 SURGICAL SUPPLY — 15 items

## 2018-04-02 NOTE — ED Notes (Signed)
Pt tolerating PO liquids without difficulty or choking.

## 2018-04-02 NOTE — H&P (View-Only) (Signed)
Referring Provider:  Dr. Regenia Skeeter Primary Care Physician:  [name], NP Primary Gastroenterologist:  Dr. Hilarie Fredrickson  Reason for Consultation:  Concern for food impaction  HPI: Kathleen Paul is a 82 y.o. female who has a history of esophageal stricture dilated by Dr. Deatra Ina in 2012.  She presented to [facility] ED today with complaints of feeling like there is food stuck and not being able to tolerate and food or liquid since eating chicken yesterday.  Not even able to swallow her saliva.  Tells me that she had been doing well all of these years with zantac, but just recently over the past few weeks started with problems swallowing again.  Mostly issues with swallowing meats like chicken, etc.     Past Medical History:  Diagnosis Date  . Anxiety state, unspecified   . Arthritis   . Atherosclerosis   . Diverticulosis   . Esophageal stricture   . Essential hypertension, benign   . History of DVT (deep vein thrombosis)    legs  . Inguinal hernia without mention of obstruction or gangrene, unilateral or unspecified, (not specified as recurrent)    2014  . Internal hemorrhoids   . Mild mitral regurgitation    a. 04/2010 Echo: nl EF, mild diast dysfxn, trace PR, mild TR, mild-mod MR.  . Non-obstructive CAD    a. 04/2010 Myoview: EF 77%, fixed apical/inferior defect;  b. 04/2010 Cath: LAD 79m, LCX/RCA minor irregs, EF 60%;  c. 04/2011 Myoview: low risk.  . Pure hypercholesterolemia   . Scoliosis   . UI (urinary incontinence)   . Umbilical hernia without mention of obstruction or gangrene    2014  . Unspecified hypothyroidism   . Vitamin D deficiency     Past Surgical History:  Procedure Laterality Date  . ABDOMINAL HYSTERECTOMY  2008  . CARDIAC CATHETERIZATION  2011   [facility]  . COLONOSCOPY  2003   [facility]  . HERNIA REPAIR Right 09/23/2012   repair [facility]  . LAPAROSCOPIC HYSTERECTOMY    . TONSILLECTOMY      Prior to Admission medications   Medication Sig Start Date  End Date Taking? Authorizing Provider  aspirin EC 81 MG tablet Take by mouth.   Yes [provider]  Biotin 1000 MCG tablet Take 1,000 mcg by mouth daily.   Yes [provider]  Calcium Carbonate-Vitamin D 600-200 MG-UNIT TABS Take by mouth.   Yes [provider]  diclofenac (VOLTAREN) 75 MG EC tablet Take 75 mg by mouth daily as needed for moderate pain.   Yes [provider]  diclofenac sodium (VOLTAREN) 1 % GEL Apply 2 g topically 3 (three) times daily as needed. 02/13/18  Yes [name], NP  enalapril-hydrochlorothiazide (VASERETIC) 10-25 MG tablet Take 1 tablet by mouth daily. 08/30/17  Yes [name], NP  hydroxychloroquine (PLAQUENIL) 200 MG tablet Take 1 tablet (200 mg total) by mouth daily. 01/04/18  Yes Ofilia Neas, PA-C  levothyroxine (SYNTHROID, LEVOTHROID) 88 MCG tablet TAKE 1 TABLET BY MOUTH EVERY MORNING ON AN EMPTY STOMACH AND WITH A FULL GLASS OF WATER 02/14/18  Yes [name], NP  metoprolol tartrate (LOPRESSOR) 25 MG tablet Every evening Patient taking differently: Take 25 mg by mouth at bedtime. Every evening  08/30/17  Yes [name], NP  Multiple Vitamin (ONE-A-DAY ESSENTIAL) TABS Take by mouth.   Yes [provider]  OVER THE COUNTER MEDICATION Apply 1 application topically daily as needed (pain). OxyRub Cream  Yes [provider]  ranitidine (ZANTAC) 150 MG tablet Take 1 tablet (150 mg total) by mouth at bedtime. 08/30/17  Yes [name], NP  simvastatin (ZOCOR) 40 MG tablet Take 1 tablet (40 mg total) by mouth at bedtime. 08/30/17  Yes [name], NP    Current Facility-Administered Medications  Medication Dose Route Frequency Provider Last Rate Last Dose  . 0.9 %  sodium chloride infusion   Intravenous Continuous Sherwood Gambler, MD 125 mL/hr at 04/02/18 1441 125 mL/hr at 04/02/18 1441   Current Outpatient Medications  Medication Sig Dispense Refill  . aspirin EC 81 MG  tablet Take by mouth.    . Biotin 1000 MCG tablet Take 1,000 mcg by mouth daily.    . Calcium Carbonate-Vitamin D 600-200 MG-UNIT TABS Take by mouth.    . diclofenac (VOLTAREN) 75 MG EC tablet Take 75 mg by mouth daily as needed for moderate pain.    Marland Kitchen diclofenac sodium (VOLTAREN) 1 % GEL Apply 2 g topically 3 (three) times daily as needed. 100 g 0  . enalapril-hydrochlorothiazide (VASERETIC) 10-25 MG tablet Take 1 tablet by mouth daily. 90 tablet 3  . hydroxychloroquine (PLAQUENIL) 200 MG tablet Take 1 tablet (200 mg total) by mouth daily. 30 tablet 2  . levothyroxine (SYNTHROID, LEVOTHROID) 88 MCG tablet TAKE 1 TABLET BY MOUTH EVERY MORNING ON AN EMPTY STOMACH AND WITH A FULL GLASS OF WATER 90 tablet 0  . metoprolol tartrate (LOPRESSOR) 25 MG tablet Every evening (Patient taking differently: Take 25 mg by mouth at bedtime. Every evening ) 90 tablet 3  . Multiple Vitamin (ONE-A-DAY ESSENTIAL) TABS Take by mouth.    Marland Kitchen OVER THE COUNTER MEDICATION Apply 1 application topically daily as needed (pain). OxyRub Cream    . ranitidine (ZANTAC) 150 MG tablet Take 1 tablet (150 mg total) by mouth at bedtime. 90 tablet 3  . simvastatin (ZOCOR) 40 MG tablet Take 1 tablet (40 mg total) by mouth at bedtime. 90 tablet 3    Allergies as of 04/02/2018 - Review Complete 04/02/2018  Allergen Reaction Noted  . Codeine Nausea Only 03/27/2011    Family History  Problem Relation Age of Onset  . Heart disease Mother   . Heart attack Mother   . Heart disease Father   . Heart attack Father   . Breast cancer Sister   . Breast cancer Maternal Aunt   . Heart disease Brother        sister    Social History   Socioeconomic History  . Marital status: Widowed    Spouse name: Not on file  . Number of children: Not on file  . Years of education: Not on file  . Highest education level: Not on file  Occupational History  . Not on file  Social Needs  . Financial resource strain: Not on file  . Food insecurity:     Worry: Not on file    Inability: Not on file  . Transportation needs:    Medical: Not on file    Non-medical: Not on file  Tobacco Use  . Smoking status: Former Smoker    Packs/day: 0.50    Years: 30.00    Pack years: 15.00    Types: Cigarettes    Last attempt to quit: 07/10/1988    Years since quitting: 29.7  . Smokeless tobacco: Never Used  Substance and Sexual Activity  . Alcohol use: No  . Drug use: Never  . Sexual activity: Not on file  Lifestyle  . Physical activity:    Days per week: Not on file    Minutes per session: Not on file  . Stress: Not on file  Relationships  . Social connections:    Talks on phone: Not on file    Gets together: Not on file    Attends religious service: Not on file    Active member of club or organization: Not on file    Attends meetings of clubs or organizations: Not on file    Relationship status: Not on file  . Intimate partner violence:    Fear of current or ex partner: Not on file    Emotionally abused: Not on file    Physically abused: Not on file    Forced sexual activity: Not on file  Other Topics Concern  . Not on file  Social History Narrative  . Not on file    Review of Systems: ROS is O/W negative except as mentioned in HPI.  Physical Exam: Vital signs in last 24 hours: Temp:  [98.4 F (36.9 C)] 98.4 F (36.9 C) (09/24 1015) Pulse Rate:  [83-98] 87 (09/24 1400) Resp:  [16-17] 17 (09/24 1400) BP: (143-156)/(61-88) 156/61 (09/24 1400) SpO2:  [100 %] 100 % (09/24 1400) Weight:  [65.8 kg] 65.8 kg (09/24 1015)   General:  Alert, Well-developed, well-nourished, pleasant and cooperative in NAD Head:  Normocephalic and atraumatic. Eyes:  Sclera clear, no icterus.   Conjunctiva pink. Ears:  Normal auditory acuity. Mouth:  No deformity or lesions.   Lungs:  Clear throughout to auscultation.   No wheezes, crackles, or rhonchi.  Heart:  Regular rate and rhythm; no murmurs, clicks, rubs, or gallops. Abdomen:   Soft,non-distended.  BS present.  Non-tender. Msk:  Symmetrical without gross deformities. Pulses:  Normal pulses noted. Extremities:  Without clubbing or edema. Neurologic:  Alert and oriented x 4;  grossly normal neurologically. Skin:  Intact without significant lesions or rashes. Psych:  Alert and cooperative. Normal mood and affect.  Intake/Output this shift: Total I/O In: 1000 [IV Piggyback:1000] Out: -   Lab Results: Recent Labs    04/02/18 1318  HGB 11.6*  HCT 34.0*   BMET Recent Labs    04/02/18 1318  NA 144  K 3.9  CL 109  GLUCOSE 94  BUN 42*  CREATININE 1.70*   IMPRESSION:  *82 year old female with history of esophageal stricture in 2012 requiring dilation who presents to a few weeks of dysphagia again.  Now acutely with possible food impaction.  Not tolerating PO intake and having to spit out saliva after eating chicken yesterday.  PLAN: *Will check 2 view chest x-ray. *EGD at beside in ED. *If all goes well then can be discharged form the ED.   Laban Emperor. Arnulfo Batson  04/02/2018, 2:56 PM

## 2018-04-02 NOTE — Op Note (Signed)
[facility] Patient Name: Kathleen Paul Procedure Date: 04/02/2018 MRN: [id number] Attending MD: Justice Britain , MD Date of Birth: 20-Apr-1936 CSN: [id number] Age: 82 Admit Type: Outpatient Procedure:                Upper GI endoscopy Indications:              Esophageal dysphagia, Foreign body in the                            esophagus, Chest pain (non cardiac) Providers:                Justice Britain, MD, Elna Breslow, RN, Elspeth Cho Tech., Technician Referring MD:             Lajuan Lines. Pyrtle, MD Medicines:                Lidocaine spray, Midazolam 4 mg IV, Fentanyl 75                            micrograms IV Complications:            No immediate complications. Estimated Blood Loss:     Estimated blood loss was minimal. Procedure:                Pre-Anesthesia Assessment:                           - Prior to the procedure, a History and Physical                            was performed, and patient medications and                            allergies were reviewed. The patient's tolerance of                            previous anesthesia was also reviewed. The risks                            and benefits of the procedure and the sedation                            options and risks were discussed with the patient.                            All questions were answered, and informed consent                            was obtained. Prior Anticoagulants: The patient has                            taken aspirin, last dose was 1 day prior to  procedure. ASA Grade Assessment: II - A patient                            with mild systemic disease. After reviewing the                            risks and benefits, the patient was deemed in                            satisfactory condition to undergo the procedure.                           After obtaining informed consent, the endoscope was         passed under direct vision. Throughout the                            procedure, the patient's blood pressure, pulse, and                            oxygen saturations were monitored continuously. The                            GIF-H190 ([id number]) Olympus adult endoscope was                            introduced through the mouth, and advanced to the                            second part of duodenum. The upper GI endoscopy was                            accomplished without difficulty. The patient                            tolerated the procedure. Scope In: Scope Out: Findings:      Initial intubation of esophagus showed fluid in the proximal esophagus.       This was suctioned.      Food (large piece of chicken) was found in the distal esophagus. Removal       of food was accomplished via air insufflation and with a gentle passage       of the scope and with suction via endoscope.      After food removal, normal mucosa was found in the proximal esophagus,       in the mid esophagus and in the distal esophagus. Biopsies were taken       with a cold forceps for histology to rule out EoE.      LA Grade C (one or more mucosal breaks continuous between tops of 2 or       more mucosal folds, less than 75% circumference) esophagitis with no       bleeding was found at the gastroesophageal junction from where the       foodstuff had been present.      One benign-appearing, intrinsic moderate (circumferential scarring or       stenosis; an endoscope  may pass) stenosis was found 31 cm from the       incisors (at the [employer]). This stenosis measured less than one cm       (in length). The stenosis was traversed.      A large hiatal hernia was present (at least 4-5 cm).      One non-bleeding superficial gastric ulcer with a clean ulcer base       (Forrest Class III) was found in the cardia within the hiatal hernia       (query large Cameron's lesion). The lesion was 8 mm in largest  dimension.      No gross lesions were noted in the entire examined stomach otherwise.       Biopsies were taken with a cold forceps for histology and Helicobacter       pylori testing from the antrum/incisura/greater curve/lesser       curve/cardia.      Multiple scattered erosions were found in the duodenal bulb and in the       first portion of the duodenum.      No gross lesions were noted in the second portion of the duodenum. Impression:               - Fluid in the proximal esophagus and Food in the                            distal esophagus. Removal was successful.                           - Normal mucosa was found in the proximal                            esophagus, in the mid esophagus and in the distal                            esophagus after removal of foodstuffs. Biopsied for                            EoE.                           - LA Grade C esophagitis at [employer].                           - Benign-appearing esophageal stenosis.                           - Large hiatal hernia.                           - Non-bleeding gastric ulcer with a clean ulcer                            base (Forrest Class III) within the hernia in the                            cardia.                           -  Otherwise, no gross lesions in the stomach.                            Biopsied to rule out HP.                           - Duodenal erosions in bulb and D1.                           - No gross lesions in the second portion of the                            duodenum. Moderate Sedation:      Moderate (conscious) sedation was administered by the endoscopy nurse       and supervised by the endoscopist. The following parameters were       monitored: oxygen saturation, heart rate, blood pressure, and response       to care. Total physician intraservice time was 18 minutes. Recommendation:           - The patient will be observed post-procedure,                            until all  discharge criteria are met.                           - Return patient to care of the Emergency                            Department.                           - Start Omeprazole 40 mg twice daily.                           - Await pathology results.                           - Repeat upper endoscopy in 6 weeks to check                            healing.                           - The findings and recommendations were discussed                            with the patient.                           - The findings and recommendations were discussed                            with the patient's family. Procedure Code(s):        --- Professional ---  [phone], Esophagogastroduodenoscopy, flexible,                            transoral; with removal of foreign body(s)                           43239, Esophagogastroduodenoscopy, flexible,                            transoral; with biopsy, single or multiple                           G0500, Moderate sedation services provided by the                            same physician or other qualified health care                            professional performing a gastrointestinal                            endoscopic service that sedation supports,                            requiring the presence of an independent trained                            observer to assist in the monitoring of the                            patient's level of consciousness and physiological                            status; initial 15 minutes of intra-service time;                            patient age 61 years or older (additional time may                            be reported with [phone], as appropriate) Diagnosis Code(s):        --- Professional ---                           [phone], Food in esophagus causing other injury,                            initial encounter                           K20.9, Esophagitis, unspecified                            K22.2, Esophageal obstruction                           K44.9, Diaphragmatic hernia without obstruction or  gangrene                           K25.9, Gastric ulcer, unspecified as acute or                            chronic, without hemorrhage or perforation                           K26.9, Duodenal ulcer, unspecified as acute or                            chronic, without hemorrhage or perforation                           R13.14, Dysphagia, pharyngoesophageal phase                           T18.108A, Unspecified foreign body in esophagus                            causing other injury, initial encounter                           R07.89, Other chest pain CPT copyright 2017 [employer]. All rights reserved. The codes documented in this report are preliminary and upon coder review may  be revised to meet current compliance requirements. Justice Britain, MD 04/02/2018 5:12:17 PM Number of Addenda: 0

## 2018-04-02 NOTE — ED Notes (Signed)
ED Provider at bedside. 

## 2018-04-02 NOTE — ED Notes (Signed)
Endoscopy at bedside. 

## 2018-04-02 NOTE — Consult Note (Signed)
Referring Provider:  Dr. Regenia Skeeter Primary Care Physician:  [name], NP Primary Gastroenterologist:  Dr. Hilarie Fredrickson  Reason for Consultation:  Concern for food impaction  HPI: Kathleen Paul is a 82 y.o. female who has a history of esophageal stricture dilated by Dr. Deatra Ina in 2012.  She presented to [facility] ED today with complaints of feeling like there is food stuck and not being able to tolerate and food or liquid since eating chicken yesterday.  Not even able to swallow her saliva.  Tells me that she had been doing well all of these years with zantac, but just recently over the past few weeks started with problems swallowing again.  Mostly issues with swallowing meats like chicken, etc.     Past Medical History:  Diagnosis Date  . Anxiety state, unspecified   . Arthritis   . Atherosclerosis   . Diverticulosis   . Esophageal stricture   . Essential hypertension, benign   . History of DVT (deep vein thrombosis)    legs  . Inguinal hernia without mention of obstruction or gangrene, unilateral or unspecified, (not specified as recurrent)    2014  . Internal hemorrhoids   . Mild mitral regurgitation    a. 04/2010 Echo: nl EF, mild diast dysfxn, trace PR, mild TR, mild-mod MR.  . Non-obstructive CAD    a. 04/2010 Myoview: EF 77%, fixed apical/inferior defect;  b. 04/2010 Cath: LAD 61m, LCX/RCA minor irregs, EF 60%;  c. 04/2011 Myoview: low risk.  . Pure hypercholesterolemia   . Scoliosis   . UI (urinary incontinence)   . Umbilical hernia without mention of obstruction or gangrene    2014  . Unspecified hypothyroidism   . Vitamin D deficiency     Past Surgical History:  Procedure Laterality Date  . ABDOMINAL HYSTERECTOMY  2008  . CARDIAC CATHETERIZATION  2011   [facility]  . COLONOSCOPY  2003   [facility]  . HERNIA REPAIR Right 09/23/2012   repair [facility]  . LAPAROSCOPIC HYSTERECTOMY    . TONSILLECTOMY      Prior to Admission medications   Medication Sig Start Date  End Date Taking? Authorizing Provider  aspirin EC 81 MG tablet Take by mouth.   Yes [provider]  Biotin 1000 MCG tablet Take 1,000 mcg by mouth daily.   Yes [provider]  Calcium Carbonate-Vitamin D 600-200 MG-UNIT TABS Take by mouth.   Yes [provider]  diclofenac (VOLTAREN) 75 MG EC tablet Take 75 mg by mouth daily as needed for moderate pain.   Yes [provider]  diclofenac sodium (VOLTAREN) 1 % GEL Apply 2 g topically 3 (three) times daily as needed. 02/13/18  Yes [name], NP  enalapril-hydrochlorothiazide (VASERETIC) 10-25 MG tablet Take 1 tablet by mouth daily. 08/30/17  Yes [name], NP  hydroxychloroquine (PLAQUENIL) 200 MG tablet Take 1 tablet (200 mg total) by mouth daily. 01/04/18  Yes Ofilia Neas, PA-C  levothyroxine (SYNTHROID, LEVOTHROID) 88 MCG tablet TAKE 1 TABLET BY MOUTH EVERY MORNING ON AN EMPTY STOMACH AND WITH A FULL GLASS OF WATER 02/14/18  Yes [name], NP  metoprolol tartrate (LOPRESSOR) 25 MG tablet Every evening Patient taking differently: Take 25 mg by mouth at bedtime. Every evening  08/30/17  Yes [name], NP  Multiple Vitamin (ONE-A-DAY ESSENTIAL) TABS Take by mouth.   Yes [provider]  OVER THE COUNTER MEDICATION Apply 1 application topically daily as needed (pain). OxyRub Cream  Yes [provider]  ranitidine (ZANTAC) 150 MG tablet Take 1 tablet (150 mg total) by mouth at bedtime. 08/30/17  Yes [name], NP  simvastatin (ZOCOR) 40 MG tablet Take 1 tablet (40 mg total) by mouth at bedtime. 08/30/17  Yes [name], NP    Current Facility-Administered Medications  Medication Dose Route Frequency Provider Last Rate Last Dose  . 0.9 %  sodium chloride infusion   Intravenous Continuous Sherwood Gambler, MD 125 mL/hr at 04/02/18 1441 125 mL/hr at 04/02/18 1441   Current Outpatient Medications  Medication Sig Dispense Refill  . aspirin EC 81 MG  tablet Take by mouth.    . Biotin 1000 MCG tablet Take 1,000 mcg by mouth daily.    . Calcium Carbonate-Vitamin D 600-200 MG-UNIT TABS Take by mouth.    . diclofenac (VOLTAREN) 75 MG EC tablet Take 75 mg by mouth daily as needed for moderate pain.    Marland Kitchen diclofenac sodium (VOLTAREN) 1 % GEL Apply 2 g topically 3 (three) times daily as needed. 100 g 0  . enalapril-hydrochlorothiazide (VASERETIC) 10-25 MG tablet Take 1 tablet by mouth daily. 90 tablet 3  . hydroxychloroquine (PLAQUENIL) 200 MG tablet Take 1 tablet (200 mg total) by mouth daily. 30 tablet 2  . levothyroxine (SYNTHROID, LEVOTHROID) 88 MCG tablet TAKE 1 TABLET BY MOUTH EVERY MORNING ON AN EMPTY STOMACH AND WITH A FULL GLASS OF WATER 90 tablet 0  . metoprolol tartrate (LOPRESSOR) 25 MG tablet Every evening (Patient taking differently: Take 25 mg by mouth at bedtime. Every evening ) 90 tablet 3  . Multiple Vitamin (ONE-A-DAY ESSENTIAL) TABS Take by mouth.    Marland Kitchen OVER THE COUNTER MEDICATION Apply 1 application topically daily as needed (pain). OxyRub Cream    . ranitidine (ZANTAC) 150 MG tablet Take 1 tablet (150 mg total) by mouth at bedtime. 90 tablet 3  . simvastatin (ZOCOR) 40 MG tablet Take 1 tablet (40 mg total) by mouth at bedtime. 90 tablet 3    Allergies as of 04/02/2018 - Review Complete 04/02/2018  Allergen Reaction Noted  . Codeine Nausea Only 03/27/2011    Family History  Problem Relation Age of Onset  . Heart disease Mother   . Heart attack Mother   . Heart disease Father   . Heart attack Father   . Breast cancer Sister   . Breast cancer Maternal Aunt   . Heart disease Brother        sister    Social History   Socioeconomic History  . Marital status: Widowed    Spouse name: Not on file  . Number of children: Not on file  . Years of education: Not on file  . Highest education level: Not on file  Occupational History  . Not on file  Social Needs  . Financial resource strain: Not on file  . Food insecurity:     Worry: Not on file    Inability: Not on file  . Transportation needs:    Medical: Not on file    Non-medical: Not on file  Tobacco Use  . Smoking status: Former Smoker    Packs/day: 0.50    Years: 30.00    Pack years: 15.00    Types: Cigarettes    Last attempt to quit: 07/10/1988    Years since quitting: 29.7  . Smokeless tobacco: Never Used  Substance and Sexual Activity  . Alcohol use: No  . Drug use: Never  . Sexual activity: Not on file  Lifestyle  . Physical activity:    Days per week: Not on file    Minutes per session: Not on file  . Stress: Not on file  Relationships  . Social connections:    Talks on phone: Not on file    Gets together: Not on file    Attends religious service: Not on file    Active member of club or organization: Not on file    Attends meetings of clubs or organizations: Not on file    Relationship status: Not on file  . Intimate partner violence:    Fear of current or ex partner: Not on file    Emotionally abused: Not on file    Physically abused: Not on file    Forced sexual activity: Not on file  Other Topics Concern  . Not on file  Social History Narrative  . Not on file    Review of Systems: ROS is O/W negative except as mentioned in HPI.  Physical Exam: Vital signs in last 24 hours: Temp:  [98.4 F (36.9 C)] 98.4 F (36.9 C) (09/24 1015) Pulse Rate:  [83-98] 87 (09/24 1400) Resp:  [16-17] 17 (09/24 1400) BP: (143-156)/(61-88) 156/61 (09/24 1400) SpO2:  [100 %] 100 % (09/24 1400) Weight:  [65.8 kg] 65.8 kg (09/24 1015)   General:  Alert, Well-developed, well-nourished, pleasant and cooperative in NAD Head:  Normocephalic and atraumatic. Eyes:  Sclera clear, no icterus.   Conjunctiva pink. Ears:  Normal auditory acuity. Mouth:  No deformity or lesions.   Lungs:  Clear throughout to auscultation.   No wheezes, crackles, or rhonchi.  Heart:  Regular rate and rhythm; no murmurs, clicks, rubs, or gallops. Abdomen:   Soft,non-distended.  BS present.  Non-tender. Msk:  Symmetrical without gross deformities. Pulses:  Normal pulses noted. Extremities:  Without clubbing or edema. Neurologic:  Alert and oriented x 4;  grossly normal neurologically. Skin:  Intact without significant lesions or rashes. Psych:  Alert and cooperative. Normal mood and affect.  Intake/Output this shift: Total I/O In: 1000 [IV Piggyback:1000] Out: -   Lab Results: Recent Labs    04/02/18 1318  HGB 11.6*  HCT 34.0*   BMET Recent Labs    04/02/18 1318  NA 144  K 3.9  CL 109  GLUCOSE 94  BUN 42*  CREATININE 1.70*   IMPRESSION:  *82 year old female with history of esophageal stricture in 2012 requiring dilation who presents to a few weeks of dysphagia again.  Now acutely with possible food impaction.  Not tolerating PO intake and having to spit out saliva after eating chicken yesterday.  PLAN: *Will check 2 view chest x-ray. *EGD at beside in ED. *If all goes well then can be discharged form the ED.   Laban Emperor. Brendan Gruwell  04/02/2018, 2:56 PM

## 2018-04-02 NOTE — Progress Notes (Signed)
Patient had EGD with removal of food bolus impaction. Discussed results with patient and family. Rx for Omeprazole sent to pharmacy. OK for patient to have liquids and then discharge per ED service. Note in Provation. I had spoken with patient's RN about results and plan.   Justice Britain, MD [facility] Office # [phone]

## 2018-04-02 NOTE — ED Notes (Signed)
GI MD at bedside

## 2018-04-02 NOTE — ED Notes (Signed)
Endo still at bedside

## 2018-04-02 NOTE — ED Notes (Signed)
Bed: OI71 Expected date:  Expected time:  Means of arrival:  Comments: ENDO pt

## 2018-04-02 NOTE — Telephone Encounter (Signed)
Spoke with pt and pts daughter and let them know she should go to the ER to be seen if she cannot swallow andy liquids. She does not want to go to the ER, daughter knows to take her to [facility] ER.

## 2018-04-02 NOTE — Interval H&P Note (Signed)
History and Physical Interval Note:  04/02/2018 4:02 PM  Kathleen Paul  has presented today for surgery, with the diagnosis of Food impaction  The various methods of treatment have been discussed with the patient and family. After consideration of risks, benefits and other options for treatment, the patient has consented to  Procedure(s): ESOPHAGOGASTRODUODENOSCOPY (EGD) WITH PROPOFOL (N/A) as a surgical intervention .  The patient's history has been reviewed, patient examined, no change in status, stable for surgery.  I have reviewed the patient's chart and labs.  Questions were answered to the patient's satisfaction.     [employer]

## 2018-04-02 NOTE — ED Triage Notes (Signed)
Per pt, states she got a piece of chicken stuck in her throat yesterday-states she has been unable to pass it-states unable to swallow water-states she has a history of the same as well as a hiatal hernia

## 2018-04-02 NOTE — Discharge Instructions (Addendum)
Take the prilosec as prescribed, follow up as directed by Dr Rush Landmark

## 2018-04-02 NOTE — ED Provider Notes (Signed)
[facility] DEPT Provider Note   CSN: [id number] Arrival date & time: 04/02/18  1007     History   Chief Complaint Chief Complaint  Patient presents with  . trouble swallowing    HPI Kathleen Paul is a 82 y.o. female.  HPI  82 year old female with a history of an esophageal stricture requiring dilation presents with being unable to swallow.  Yesterday around noon was eating chicken breast and then since then has been unable to swallow.  Swallow liquids or her own saliva.  There is no trouble breathing.  There is a little bit of discomfort but no real pain.  No vomiting.  Her GI is Dr. Hilarie Fredrickson.  Past Medical History:  Diagnosis Date  . Anxiety state, unspecified   . Arthritis   . Atherosclerosis   . Diverticulosis   . Esophageal stricture   . Essential hypertension, benign   . History of DVT (deep vein thrombosis)    legs  . Inguinal hernia without mention of obstruction or gangrene, unilateral or unspecified, (not specified as recurrent)    2014  . Internal hemorrhoids   . Mild mitral regurgitation    a. 04/2010 Echo: nl EF, mild diast dysfxn, trace PR, mild TR, mild-mod MR.  . Non-obstructive CAD    a. 04/2010 Myoview: EF 77%, fixed apical/inferior defect;  b. 04/2010 Cath: LAD 55m, LCX/RCA minor irregs, EF 60%;  c. 04/2011 Myoview: low risk.  . Pure hypercholesterolemia   . Scoliosis   . UI (urinary incontinence)   . Umbilical hernia without mention of obstruction or gangrene    2014  . Unspecified hypothyroidism   . Vitamin D deficiency     Patient Active Problem List   Diagnosis Date Noted  . Rheumatoid arthritis of multiple sites with negative rheumatoid factor ([place]) 12/24/2017  . Primary osteoarthritis of both hands 12/24/2017  . Primary osteoarthritis of both knees 12/24/2017  . Primary osteoarthritis of both feet 12/24/2017  . Osteoarthritis of both shoulders 12/24/2017  . Gastroesophageal reflux disease 08/30/2017  . GAD  (generalized anxiety disorder) 09/05/2016  . Renal insufficiency 07/17/2013  . Suprapubic mass 08/20/2012  . Hypothyroidism   . UI (urinary incontinence)   . Stricture and stenosis of esophagus 04/11/2011  . Chest pain 03/29/2011  . Dyslipidemia 03/29/2011  . Hypertension 03/29/2011  . CAD (coronary artery disease) 03/29/2011  . Palpitations 03/29/2011    Past Surgical History:  Procedure Laterality Date  . ABDOMINAL HYSTERECTOMY  2008  . CARDIAC CATHETERIZATION  2011   [facility]  . COLONOSCOPY  2003   [facility]  . HERNIA REPAIR Right 09/23/2012   repair [facility]  . LAPAROSCOPIC HYSTERECTOMY    . TONSILLECTOMY       OB History   None    Obstetric Comments  Age first menstruation-13 Age LMP-58,hysterectomy in 2008         Home Medications    Prior to Admission medications   Medication Sig Start Date End Date Taking? Authorizing Provider  aspirin EC 81 MG tablet Take by mouth.   Yes [provider]  Biotin 1000 MCG tablet Take 1,000 mcg by mouth daily.   Yes [provider]  Calcium Carbonate-Vitamin D 600-200 MG-UNIT TABS Take by mouth.   Yes [provider]  diclofenac (VOLTAREN) 75 MG EC tablet Take 75 mg by mouth daily as needed for moderate pain.   Yes [provider]  diclofenac sodium (VOLTAREN) 1 % GEL Apply 2 g topically 3 (three)  times daily as needed. 02/13/18  Yes [name], NP  enalapril-hydrochlorothiazide (VASERETIC) 10-25 MG tablet Take 1 tablet by mouth daily. 08/30/17  Yes [name], NP  hydroxychloroquine (PLAQUENIL) 200 MG tablet Take 1 tablet (200 mg total) by mouth daily. 01/04/18  Yes Ofilia Neas, PA-C  levothyroxine (SYNTHROID, LEVOTHROID) 88 MCG tablet TAKE 1 TABLET BY MOUTH EVERY MORNING ON AN EMPTY STOMACH AND WITH A FULL GLASS OF WATER 02/14/18  Yes [name], NP  metoprolol tartrate (LOPRESSOR) 25 MG tablet Every evening Patient taking differently: Take 25 mg by mouth at bedtime.  Every evening  08/30/17  Yes [name], NP  Multiple Vitamin (ONE-A-DAY ESSENTIAL) TABS Take by mouth.   Yes [provider]  OVER THE COUNTER MEDICATION Apply 1 application topically daily as needed (pain). OxyRub Cream   Yes [provider]  ranitidine (ZANTAC) 150 MG tablet Take 1 tablet (150 mg total) by mouth at bedtime. 08/30/17  Yes [name], NP  simvastatin (ZOCOR) 40 MG tablet Take 1 tablet (40 mg total) by mouth at bedtime. 08/30/17  Yes [name], NP    Family History Family History  Problem Relation Age of Onset  . Heart disease Mother   . Heart attack Mother   . Heart disease Father   . Heart attack Father   . Breast cancer Sister   . Breast cancer Maternal Aunt   . Heart disease Brother        sister    Social History Social History   Tobacco Use  . Smoking status: Former Smoker    Packs/day: 0.50    Years: 30.00    Pack years: 15.00    Types: Cigarettes    Last attempt to quit: 07/10/1988    Years since quitting: 29.7  . Smokeless tobacco: Never Used  Substance Use Topics  . Alcohol use: No  . Drug use: Never     Allergies   Codeine   Review of Systems Review of Systems  HENT: Positive for trouble swallowing. Negative for sore throat.   Respiratory: Negative for shortness of breath.   Cardiovascular: Negative for chest pain.  Gastrointestinal: Negative for vomiting.  All other systems reviewed and are negative.    Physical Exam Updated Vital Signs BP (!) 156/61 (BP Location: Right Arm)   Pulse 87   Temp 98.4 F (36.9 C) (Oral)   Resp 17   Ht 5' (1.524 m)   Wt 65.8 kg   SpO2 100%   BMI 28.32 kg/m   Physical Exam  Constitutional: She appears well-developed and well-nourished. No distress.  HENT:  Head: Normocephalic and atraumatic.  Right Ear: External ear normal.  Left Ear: External ear normal.  Nose: Nose normal.  Mouth/Throat: No oropharyngeal exudate.  No drooling or difficulty speaking    Eyes: Right eye exhibits no discharge. Left eye exhibits no discharge.  Cardiovascular: Normal rate, regular rhythm and normal heart sounds.  Pulmonary/Chest: Effort normal and breath sounds normal. No stridor. She has no wheezes.  Abdominal: Soft. She exhibits no distension. There is no tenderness.  Neurological: She is alert.  Skin: Skin is warm and dry. She is not diaphoretic.  Psychiatric: Her mood appears not anxious.  Nursing note and vitals reviewed.    ED Treatments / Results  Labs (all labs ordered are listed, but only abnormal results are displayed) Labs Reviewed  I-STAT CHEM 8, ED - Abnormal; Notable for the following components:  Result Value   BUN 42 (*)    Creatinine, Ser 1.70 (*)    TCO2 21 (*)    Hemoglobin 11.6 (*)    HCT 34.0 (*)    All other components within normal limits    EKG None  Radiology No results found.  Procedures Procedures (including critical care time)  Medications Ordered in ED Medications  0.9 %  sodium chloride infusion (125 mL/hr Intravenous New Bag/Given (Non-Interop) 04/02/18 1441)  sodium chloride 0.9 % bolus 1,000 mL (0 mLs Intravenous Stopped 04/02/18 1403)     Initial Impression / Assessment and Plan / ED Course  I have reviewed the triage vital signs and the nursing notes.  Pertinent labs & imaging results that were available during my care of the patient were reviewed by me and considered in my medical decision making (see chart for details).     Patient presents with inability to tolerate PO due to esophageal impaction/stricture. Bump in Cr is likely from dehydration, given IV fluids here. GI consulted, will scope in ED. If able to remove, likely can d/c home. If this is case, creatinine should improve given she can take PO again. Care transferred to Dr. Tomi Bamberger with EGD pending.  Final Clinical Impressions(s) / ED Diagnoses   Final diagnoses:  Food impaction of esophagus    ED Discharge Orders    None        Sherwood Gambler, MD 04/02/18 1606

## 2018-04-02 NOTE — ED Provider Notes (Signed)
Pt had her EGD.  She is feeling much better.  She is able to tolerate fluids and is ready for discharge.   Dorie Rank, MD 04/02/18 [id number]

## 2018-04-03 ENCOUNTER — Telehealth: Payer: Self-pay | Admitting: *Deleted

## 2018-04-03 NOTE — Telephone Encounter (Signed)
-----   Message from Jerene Bears, MD sent at 04/02/2018  9:41 PM EDT ----- Can we have her return for OV in Nov if appt slots still available Thanks JMP  ----- Message ----- From: Irving Copas., MD Sent: 04/02/2018   6:17 PM EDT To: Jerene Bears, MD, Loralie Champagne, PA-C  Jay, Patient presented with acute food impaction after 24 hours. Very raw at the [employer]. Hiatal hernia is large and not likely helping, after I pushed the food into the stomach it hung out there and I had to then remove it into the stomach. 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 Peptic duodenitis noted. EoE biopsies pending.  HP biopsies pending. I placed on PPI BID. She needs follow up in clinic and EGD in 6-8 weeks. Let me know if I can help in any way. Thanks.  Chester Holstein

## 2018-04-03 NOTE — Telephone Encounter (Signed)
Patient has been scheduled for follow up in clinic 05/21/18. Patient is agreeable to this and verbalizes understanding.

## 2018-04-05 ENCOUNTER — Encounter: Payer: Self-pay | Admitting: Rheumatology

## 2018-04-05 ENCOUNTER — Ambulatory Visit: Payer: PPO | Admitting: Rheumatology

## 2018-04-05 VITALS — BP 141/75 | HR 54 | Resp 14 | Ht 60.0 in | Wt 150.4 lb

## 2018-04-05 DIAGNOSIS — K222 Esophageal obstruction: Secondary | ICD-10-CM | POA: Diagnosis not present

## 2018-04-05 DIAGNOSIS — R002 Palpitations: Secondary | ICD-10-CM

## 2018-04-05 DIAGNOSIS — Z86718 Personal history of other venous thrombosis and embolism: Secondary | ICD-10-CM

## 2018-04-05 DIAGNOSIS — M19041 Primary osteoarthritis, right hand: Secondary | ICD-10-CM

## 2018-04-05 DIAGNOSIS — Z8679 Personal history of other diseases of the circulatory system: Secondary | ICD-10-CM | POA: Diagnosis not present

## 2018-04-05 DIAGNOSIS — M170 Bilateral primary osteoarthritis of knee: Secondary | ICD-10-CM

## 2018-04-05 DIAGNOSIS — F411 Generalized anxiety disorder: Secondary | ICD-10-CM

## 2018-04-05 DIAGNOSIS — I10 Essential (primary) hypertension: Secondary | ICD-10-CM

## 2018-04-05 DIAGNOSIS — N289 Disorder of kidney and ureter, unspecified: Secondary | ICD-10-CM

## 2018-04-05 DIAGNOSIS — M1120 Other chondrocalcinosis, unspecified site: Secondary | ICD-10-CM

## 2018-04-05 DIAGNOSIS — E785 Hyperlipidemia, unspecified: Secondary | ICD-10-CM

## 2018-04-05 DIAGNOSIS — Z8719 Personal history of other diseases of the digestive system: Secondary | ICD-10-CM

## 2018-04-05 DIAGNOSIS — M19071 Primary osteoarthritis, right ankle and foot: Secondary | ICD-10-CM

## 2018-04-05 DIAGNOSIS — M19012 Primary osteoarthritis, left shoulder: Secondary | ICD-10-CM

## 2018-04-05 DIAGNOSIS — M19011 Primary osteoarthritis, right shoulder: Secondary | ICD-10-CM | POA: Diagnosis not present

## 2018-04-05 DIAGNOSIS — M19042 Primary osteoarthritis, left hand: Secondary | ICD-10-CM

## 2018-04-05 DIAGNOSIS — M19072 Primary osteoarthritis, left ankle and foot: Secondary | ICD-10-CM

## 2018-04-11 ENCOUNTER — Ambulatory Visit: Payer: PPO | Admitting: Primary Care

## 2018-04-15 ENCOUNTER — Other Ambulatory Visit: Payer: Self-pay | Admitting: Dermatology

## 2018-04-15 DIAGNOSIS — D0439 Carcinoma in situ of skin of other parts of face: Secondary | ICD-10-CM | POA: Diagnosis not present

## 2018-04-15 DIAGNOSIS — L820 Inflamed seborrheic keratosis: Secondary | ICD-10-CM | POA: Diagnosis not present

## 2018-04-15 DIAGNOSIS — D485 Neoplasm of uncertain behavior of skin: Secondary | ICD-10-CM | POA: Diagnosis not present

## 2018-04-15 DIAGNOSIS — D229 Melanocytic nevi, unspecified: Secondary | ICD-10-CM | POA: Diagnosis not present

## 2018-04-15 DIAGNOSIS — C4401 Basal cell carcinoma of skin of lip: Secondary | ICD-10-CM | POA: Diagnosis not present

## 2018-04-15 DIAGNOSIS — L570 Actinic keratosis: Secondary | ICD-10-CM | POA: Diagnosis not present

## 2018-04-18 ENCOUNTER — Other Ambulatory Visit: Payer: Self-pay | Admitting: Primary Care

## 2018-04-18 ENCOUNTER — Ambulatory Visit (INDEPENDENT_AMBULATORY_CARE_PROVIDER_SITE_OTHER): Payer: PPO | Admitting: Primary Care

## 2018-04-18 ENCOUNTER — Encounter: Payer: Self-pay | Admitting: Primary Care

## 2018-04-18 VITALS — BP 142/74 | HR 56 | Temp 97.8°F | Ht 60.0 in | Wt 151.5 lb

## 2018-04-18 DIAGNOSIS — M19012 Primary osteoarthritis, left shoulder: Secondary | ICD-10-CM | POA: Diagnosis not present

## 2018-04-18 DIAGNOSIS — M19011 Primary osteoarthritis, right shoulder: Secondary | ICD-10-CM

## 2018-04-18 DIAGNOSIS — N289 Disorder of kidney and ureter, unspecified: Secondary | ICD-10-CM

## 2018-04-18 DIAGNOSIS — M19072 Primary osteoarthritis, left ankle and foot: Secondary | ICD-10-CM

## 2018-04-18 DIAGNOSIS — M170 Bilateral primary osteoarthritis of knee: Secondary | ICD-10-CM

## 2018-04-18 DIAGNOSIS — M19041 Primary osteoarthritis, right hand: Secondary | ICD-10-CM | POA: Diagnosis not present

## 2018-04-18 DIAGNOSIS — Z23 Encounter for immunization: Secondary | ICD-10-CM | POA: Diagnosis not present

## 2018-04-18 DIAGNOSIS — E785 Hyperlipidemia, unspecified: Secondary | ICD-10-CM | POA: Diagnosis not present

## 2018-04-18 DIAGNOSIS — M19042 Primary osteoarthritis, left hand: Secondary | ICD-10-CM

## 2018-04-18 DIAGNOSIS — M19071 Primary osteoarthritis, right ankle and foot: Secondary | ICD-10-CM | POA: Diagnosis not present

## 2018-04-18 DIAGNOSIS — I10 Essential (primary) hypertension: Secondary | ICD-10-CM | POA: Diagnosis not present

## 2018-04-18 LAB — BASIC METABOLIC PANEL
BUN: 32 mg/dL — ABNORMAL HIGH (ref 6–23)
CALCIUM: 9.3 mg/dL (ref 8.4–10.5)
CHLORIDE: 101 meq/L (ref 96–112)
CO2: 27 meq/L (ref 19–32)
Creatinine, Ser: 1.29 mg/dL — ABNORMAL HIGH (ref 0.40–1.20)
GFR: 42.22 mL/min — ABNORMAL LOW (ref >60.00)
Glucose, Bld: 90 mg/dL (ref 70–99)
POTASSIUM: 4.2 meq/L (ref 3.5–5.1)
SODIUM: 137 meq/L (ref 135–145)

## 2018-04-18 MED ORDER — ZOSTER VAC RECOMB ADJUVANTED 50 MCG/0.5ML IM SUSR: 0.5000 mL | Freq: Once | INTRAMUSCULAR | 1 refills | Status: AC

## 2018-04-18 MED ORDER — SIMVASTATIN 20 MG PO TABS: ORAL_TABLET | ORAL | 3 refills | Status: DC

## 2018-04-18 MED ORDER — DICLOFENAC SODIUM 75 MG PO TBEC: 75.0000 mg | DELAYED_RELEASE_TABLET | Freq: Every day | ORAL | 0 refills | Status: DC | PRN

## 2018-04-18 NOTE — Assessment & Plan Note (Signed)
Debilitating osteoarthritis causing decrease in overall function. Given decrease in renal function treatment with anti-inflammatories is difficult. She found no relive with diclofenac gel.   Check baseline renal function today. Start Tylenol 1000 mg every 8 hours routinely. Consider reinitiating diclofenac once daily to every other day and recheck renal function in one month. Discussed the potential effects of use of NSAID's with CKD including permanent renal damage, she verbalized understanding.   Also reduce statin dose to 20 mg, repeat lipids in one month. She and her daughter agree with the plan.

## 2018-04-18 NOTE — Assessment & Plan Note (Addendum)
Debilitating osteoarthritis causing decrease in overall function. Given decrease in renal function treatment with anti-inflammatories is difficult. She found no relive with diclofenac gel.   Check baseline renal function today. Start Tylenol 1000 mg every 8 hours routinely. Consider reinitiating diclofenac once daily to every other day and recheck renal function in one month. Discussed the potential effects of use of NSAID's with CKD including permanent renal damage, she verbalized understanding.   Also reduce statin dose to 20 mg, repeat lipids in one month. She and her daughter agree with the plan.

## 2018-04-18 NOTE — Progress Notes (Signed)
Subjective:    Patient ID: Kathleen Paul, female    DOB: Jul 02, 1936, 82 y.o.   MRN: [id number]  HPI  Kathleen Paul is an 82 year old female who presents today to discuss arthritis pain. She is due for pneumonia and influenza vaccinations today.  She has a history of osteoarthritis to bilateral feet, hands, knees, shoulders. She is currently following with Dr. Estanislado Pandy through orthopedics with her last visit being in late September 2019. She had tried Plaquenil without improvement in symptoms. She does find relief with diclofenac but has a history of decreased renal function. GFR of 39 with creatinine of 1.28 in July 2019.   During her most recent visit with Dr. Estanislado Pandy she reported stiffness and pain to numerous joints without swelling. Her exam was consistent for primary osteoarthritis to the hands with DIP and PIP thickening and without synovitis. The cause of her symptoms is thought to be chondrocalcinosis which was apparent in her xrays. She was offered a cortisone injection to the knees for which she declined.   Today she reports continued joint pain to her shoulders and knees which has limited her ADL's. She is unable to walk up stairs, she feels unsteady when around her home, cannot get in and out of her shower, has pain and stiffness when getting up and out of her chair. She would like to resume her diclofenac as she found it was of most benefit. She was mostly taking her diclofenac once daily, sometimes every other day.  Review of Systems  Musculoskeletal: Positive for arthralgias. Negative for joint swelling.  Skin: Negative for color change.  Neurological: Positive for weakness. Negative for dizziness.       Past Medical History:  Diagnosis Date  . Anxiety state, unspecified   . Arthritis   . Atherosclerosis   . Diverticulosis   . Esophageal stricture   . Essential hypertension, benign   . History of DVT (deep vein thrombosis)    legs  . Inguinal hernia without mention  of obstruction or gangrene, unilateral or unspecified, (not specified as recurrent)    2014  . Internal hemorrhoids   . Mild mitral regurgitation    a. 04/2010 Echo: nl EF, mild diast dysfxn, trace PR, mild TR, mild-mod MR.  . Non-obstructive CAD    a. 04/2010 Myoview: EF 77%, fixed apical/inferior defect;  b. 04/2010 Cath: LAD 70m, LCX/RCA minor irregs, EF 60%;  c. 04/2011 Myoview: low risk.  . Pure hypercholesterolemia   . Scoliosis   . UI (urinary incontinence)   . Umbilical hernia without mention of obstruction or gangrene    2014  . Unspecified hypothyroidism   . Vitamin D deficiency      Social History   Socioeconomic History  . Marital status: Widowed    Spouse name: Not on file  . Number of children: Not on file  . Years of education: Not on file  . Highest education level: Not on file  Occupational History  . Not on file  Social Needs  . Financial resource strain: Not on file  . Food insecurity:    Worry: Not on file    Inability: Not on file  . Transportation needs:    Medical: Not on file    Non-medical: Not on file  Tobacco Use  . Smoking status: Former Smoker    Packs/day: 0.50    Years: 30.00    Pack years: 15.00    Types: Cigarettes    Last attempt to quit: 07/10/1988  Years since quitting: 29.7  . Smokeless tobacco: Never Used  Substance and Sexual Activity  . Alcohol use: No  . Drug use: Never  . Sexual activity: Not on file  Lifestyle  . Physical activity:    Days per week: Not on file    Minutes per session: Not on file  . Stress: Not on file  Relationships  . Social connections:    Talks on phone: Not on file    Gets together: Not on file    Attends religious service: Not on file    Active member of club or organization: Not on file    Attends meetings of clubs or organizations: Not on file    Relationship status: Not on file  . Intimate partner violence:    Fear of current or ex partner: Not on file    Emotionally abused: Not on file      Physically abused: Not on file    Forced sexual activity: Not on file  Other Topics Concern  . Not on file  Social History Narrative  . Not on file    Past Surgical History:  Procedure Laterality Date  . ABDOMINAL HYSTERECTOMY  2008  . BIOPSY  04/02/2018   Procedure: BIOPSY;  Surgeon: Rush Landmark Telford Nab., MD;  Location: [facility];  Service: Gastroenterology;;  . CARDIAC CATHETERIZATION  2011   [facility]  . COLONOSCOPY  2003   [facility]  . ESOPHAGOGASTRODUODENOSCOPY (EGD) WITH PROPOFOL N/A 04/02/2018   Procedure: ESOPHAGOGASTRODUODENOSCOPY (EGD) WITH PROPOFOL;  Surgeon: Rush Landmark Telford Nab., MD;  Location: [facility];  Service: Gastroenterology;  Laterality: N/A;  . FOREIGN BODY REMOVAL  04/02/2018   Procedure: FOREIGN BODY REMOVAL;  Surgeon: Rush Landmark Telford Nab., MD;  Location: [facility];  Service: Gastroenterology;;  . HERNIA REPAIR Right 09/23/2012   repair [facility]  . LAPAROSCOPIC HYSTERECTOMY    . TONSILLECTOMY      Family History  Problem Relation Age of Onset  . Heart disease Mother   . Heart attack Mother   . Heart disease Father   . Heart attack Father   . Breast cancer Sister   . Breast cancer Maternal Aunt   . Heart disease Brother        sister    Allergies  Allergen Reactions  . Codeine Nausea Only    Current Outpatient Medications on File Prior to Visit  Medication Sig Dispense Refill  . aspirin EC 81 MG tablet Take by mouth.    . Biotin 1000 MCG tablet Take 1,000 mcg by mouth daily.    . Calcium Carbonate-Vitamin D 600-200 MG-UNIT TABS Take by mouth.    . enalapril-hydrochlorothiazide (VASERETIC) 10-25 MG tablet Take 1 tablet by mouth daily. 90 tablet 3  . levothyroxine (SYNTHROID, LEVOTHROID) 88 MCG tablet TAKE 1 TABLET BY MOUTH EVERY MORNING ON AN EMPTY STOMACH AND WITH A FULL GLASS OF WATER 90 tablet 0  . metoprolol tartrate (LOPRESSOR) 25 MG tablet Every evening (Patient taking differently: Take 25 mg by mouth at bedtime. Every  evening ) 90 tablet 3  . Multiple Vitamin (ONE-A-DAY ESSENTIAL) TABS Take by mouth.    Marland Kitchen omeprazole (PRILOSEC) 40 MG capsule Take 1 capsule (40 mg total) by mouth 2 times daily at 12 noon and 4 pm. Take 30 minutes before breakfast and dinner. 90 capsule 3  . OVER THE COUNTER MEDICATION Apply 1 application topically daily as needed (pain). OxyRub Cream    . ranitidine (ZANTAC) 150 MG tablet Take 1 tablet (150 mg total) by  mouth at bedtime. 90 tablet 3  . diclofenac (VOLTAREN) 75 MG EC tablet Take 75 mg by mouth daily as needed for moderate pain.    Marland Kitchen diclofenac sodium (VOLTAREN) 1 % GEL Apply 2 g topically 3 (three) times daily as needed. (Patient not taking: Reported on 04/18/2018) 100 g 0   No current facility-administered medications on file prior to visit.     BP (!) 142/74   Pulse (!) 56   Temp 97.8 F (36.6 C) (Oral)   Ht 5' (1.524 m)   Wt 151 lb 8 oz (68.7 kg)   SpO2 95%   BMI 29.59 kg/m    Objective:   Physical Exam  Constitutional: She is oriented to person, place, and time. She appears well-nourished.  Neck: Neck supple.  Cardiovascular: Normal rate and regular rhythm.  Respiratory: Effort normal and breath sounds normal.  Musculoskeletal:  Ambulatory in clinic with assistive device. Generalized decrease in ROM to lower extremities and shoulders.   Neurological: She is alert and oriented to person, place, and time.  Skin: Skin is warm and dry.           Assessment & Plan:

## 2018-04-18 NOTE — Patient Instructions (Addendum)
Stop by the lab prior to leaving today. I will notify you of your results once received.   Start taking Tylenol 1000 mg every 8 hours for pain for now.   We've reduced the dose of your simvastatin to 20 mg. This may help to reduce joint pain.   Schedule a lab only appointment in 4 weeks to repeat kidney function and cholesterol. Make sure to fast 4 hours prior to this appointment.   It was a pleasure to see you today!

## 2018-04-19 ENCOUNTER — Telehealth: Payer: Self-pay

## 2018-04-19 DIAGNOSIS — M19071 Primary osteoarthritis, right ankle and foot: Secondary | ICD-10-CM

## 2018-04-19 DIAGNOSIS — M19042 Primary osteoarthritis, left hand: Principal | ICD-10-CM

## 2018-04-19 DIAGNOSIS — M170 Bilateral primary osteoarthritis of knee: Secondary | ICD-10-CM

## 2018-04-19 DIAGNOSIS — M19041 Primary osteoarthritis, right hand: Secondary | ICD-10-CM

## 2018-04-19 DIAGNOSIS — M19012 Primary osteoarthritis, left shoulder: Secondary | ICD-10-CM

## 2018-04-19 DIAGNOSIS — M19072 Primary osteoarthritis, left ankle and foot: Secondary | ICD-10-CM

## 2018-04-19 DIAGNOSIS — M19011 Primary osteoarthritis, right shoulder: Secondary | ICD-10-CM

## 2018-04-19 MED ORDER — HYDROXYCHLOROQUINE SULFATE 200 MG PO TABS: ORAL_TABLET | ORAL | 0 refills | Status: DC

## 2018-04-19 NOTE — Telephone Encounter (Signed)
Spoken and notified patient of Kathleen Paul comments. Patient stated that can [name] send a refill of plaquenil 200 mg to [facility]. She does not have any more and does not plan on going back to see Dr Estanislado Pandy.

## 2018-04-19 NOTE — Telephone Encounter (Signed)
Thank you for notifying us that Kathleen Paul will be restarting on PLQ. She will require CBC and CMP every 5 months, which she can have performed at our office or yours.

## 2018-04-19 NOTE — Addendum Note (Signed)
Addended by: Jacqualin Combes on: 04/19/2018 08:17 AM   Modules accepted: Orders

## 2018-04-19 NOTE — Telephone Encounter (Signed)
I spoke with pt; pt was seen 04/18/18. Pt took tylenol 1000 mg yesterday. Pt took her last plaquenil yesterday afternoon and was pain free for first time in several months. Pt wants to know if should take plaquenil instead of diclofenac with tylenol. [facility] [address]. Pt request cb.

## 2018-04-19 NOTE — Telephone Encounter (Signed)
Copied from [place] [id number]. Topic: General - Other >> Apr 19, 2018  9:32 AM Bea Graff, NT wrote: Reason for CRM: Pt requesting a call back to discuss the medication hydroxychloroquine (PLAQUENIL) 200 MG tablet. She states she has a few questions about the medication.

## 2018-04-19 NOTE — Telephone Encounter (Addendum)
Please notify patient that I don't typically prescribe this medication as it requires close monitoring with labs and eye exams. I will refill her prescription for now, but she'll need to see rheumatology. Does she want to see a new rheumatologist?

## 2018-04-19 NOTE — Telephone Encounter (Signed)
Yes, please notify patient that I'd rather her start her plaquenil with Tylenol rather than taking diclofenac. Will cancel diclofenac prescription and notify Dr. Estanislado Pandy that she's restarted plaquenil.

## 2018-04-23 NOTE — Telephone Encounter (Signed)
Spoken and notified patient of Kathleen Paul comments. Patient stated that she does not want to see another rheumatology she would prefer if [name] handle this. Patient stated after the lab appt on 05/16/2018. She will come if needed to discuss further if needed.

## 2018-04-24 NOTE — Telephone Encounter (Signed)
In order for me to continue to manage this medication she'll need to do the following.  1. Labs every 5 months, next lab draw due in December this year. Please schedule. 2. Annual eye exams by an optometrist/opthalmologist. When was her last eye exam? 3. Is she willing to follow through with my requests? It's the only way I'll be able to continue her plaquenil.

## 2018-04-26 NOTE — Telephone Encounter (Signed)
Per DPR, left detail message of Kate Clark's comments for patient to call back 

## 2018-05-03 NOTE — Telephone Encounter (Signed)
Per DPR, left detail message of Kate Clark's comments for patient to call back 

## 2018-05-06 ENCOUNTER — Ambulatory Visit: Payer: PPO | Admitting: Podiatry

## 2018-05-06 ENCOUNTER — Encounter: Payer: Self-pay | Admitting: Podiatry

## 2018-05-06 DIAGNOSIS — B351 Tinea unguium: Secondary | ICD-10-CM

## 2018-05-06 DIAGNOSIS — M2010 Hallux valgus (acquired), unspecified foot: Secondary | ICD-10-CM

## 2018-05-06 DIAGNOSIS — M79676 Pain in unspecified toe(s): Secondary | ICD-10-CM

## 2018-05-06 DIAGNOSIS — Q828 Other specified congenital malformations of skin: Secondary | ICD-10-CM

## 2018-05-06 NOTE — Progress Notes (Signed)
Complaint:  Visit Type: Patient returns to my office for continued preventative foot care services. Complaint: Patient states" my nails have grown long and thick and become painful to walk and wear shoes"  The patient presents for preventative foot care services. No changes to ROS. Patient has painful callus right foot.   Podiatric Exam: Vascular: dorsalis pedis and posterior tibial pulses are palpable bilateral. Capillary return is immediate. Temperature gradient is WNL. Skin turgor WNL  Sensorium: Normal Semmes Weinstein monofilament test. Normal tactile sensation bilaterally. Nail Exam: Pt has thick disfigured discolored nails with subungual debris noted bilateral entire nail hallux through fifth toenails Ulcer Exam: There is no evidence of ulcer or pre-ulcerative changes or infection. Orthopedic Exam: Muscle tone and strength are WNL. No limitations in general ROM. No crepitus or effusions noted. Foot type and digits show no abnormalities. 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 Skin: No Porokeratosis. No infection or ulcers.  [place] durum 5th toe right asymptomatic.  Porokeratosis sub 5th met B/L asymptomatic.    Diagnosis:  Onychomycosis, , Pain in right toe, pain in left toes    Treatment & Plan Procedures and Treatment: Consent by patient was obtained for treatment procedures. The patient understood the discussion of treatment and procedures well. All questions were answered thoroughly reviewed. Debridement of mycotic and hypertrophic toenails, 1 through 5 bilateral and clearing of subungual debris. No ulceration, no infection noted. Return Visit-Office Procedure: Patient instructed to return to the office for a follow up visit 3 months for continued evaluation and treatment.    Gardiner Barefoot DPM

## 2018-05-14 ENCOUNTER — Other Ambulatory Visit: Payer: Self-pay | Admitting: Primary Care

## 2018-05-14 DIAGNOSIS — M19041 Primary osteoarthritis, right hand: Secondary | ICD-10-CM

## 2018-05-14 DIAGNOSIS — M19011 Primary osteoarthritis, right shoulder: Secondary | ICD-10-CM

## 2018-05-14 DIAGNOSIS — M19072 Primary osteoarthritis, left ankle and foot: Secondary | ICD-10-CM

## 2018-05-14 DIAGNOSIS — M170 Bilateral primary osteoarthritis of knee: Secondary | ICD-10-CM

## 2018-05-14 DIAGNOSIS — M19012 Primary osteoarthritis, left shoulder: Secondary | ICD-10-CM

## 2018-05-14 DIAGNOSIS — M19042 Primary osteoarthritis, left hand: Secondary | ICD-10-CM

## 2018-05-14 DIAGNOSIS — E039 Hypothyroidism, unspecified: Secondary | ICD-10-CM

## 2018-05-14 DIAGNOSIS — M19071 Primary osteoarthritis, right ankle and foot: Secondary | ICD-10-CM

## 2018-05-15 DIAGNOSIS — D0439 Carcinoma in situ of skin of other parts of face: Secondary | ICD-10-CM | POA: Diagnosis not present

## 2018-05-16 ENCOUNTER — Other Ambulatory Visit (INDEPENDENT_AMBULATORY_CARE_PROVIDER_SITE_OTHER): Payer: PPO

## 2018-05-16 ENCOUNTER — Encounter: Payer: Self-pay | Admitting: *Deleted

## 2018-05-16 DIAGNOSIS — E785 Hyperlipidemia, unspecified: Secondary | ICD-10-CM | POA: Diagnosis not present

## 2018-05-16 DIAGNOSIS — N289 Disorder of kidney and ureter, unspecified: Secondary | ICD-10-CM | POA: Diagnosis not present

## 2018-05-16 LAB — LIPID PANEL
CHOLESTEROL: 133 mg/dL (ref 0–200)
HDL: 63.3 mg/dL (ref >39.00)
LDL Cholesterol: 54 mg/dL (ref 0–99)
NonHDL: 70.07
TRIGLYCERIDES: 82 mg/dL (ref 0.0–149.0)
Total CHOL/HDL Ratio: 2
VLDL: 16.4 mg/dL (ref 0.0–40.0)

## 2018-05-16 LAB — BASIC METABOLIC PANEL
BUN: 28 mg/dL — AB (ref 6–23)
CHLORIDE: 99 meq/L (ref 96–112)
CO2: 27 meq/L (ref 19–32)
Calcium: 8.9 mg/dL (ref 8.4–10.5)
Creatinine, Ser: 1.22 mg/dL — ABNORMAL HIGH (ref 0.40–1.20)
GFR: 45.02 mL/min — AB (ref >60.00)
GLUCOSE: 85 mg/dL (ref 70–99)
POTASSIUM: 4 meq/L (ref 3.5–5.1)
SODIUM: 134 meq/L — AB (ref 135–145)

## 2018-05-20 ENCOUNTER — Encounter: Payer: Self-pay | Admitting: Family Medicine

## 2018-05-20 ENCOUNTER — Ambulatory Visit (INDEPENDENT_AMBULATORY_CARE_PROVIDER_SITE_OTHER): Payer: PPO | Admitting: Family Medicine

## 2018-05-20 ENCOUNTER — Other Ambulatory Visit: Payer: Self-pay | Admitting: Primary Care

## 2018-05-20 VITALS — BP 156/74 | HR 53 | Temp 98.5°F | Ht 60.0 in | Wt 151.4 lb

## 2018-05-20 DIAGNOSIS — I10 Essential (primary) hypertension: Secondary | ICD-10-CM | POA: Diagnosis not present

## 2018-05-20 DIAGNOSIS — H6123 Impacted cerumen, bilateral: Secondary | ICD-10-CM

## 2018-05-20 DIAGNOSIS — R42 Dizziness and giddiness: Secondary | ICD-10-CM

## 2018-05-20 NOTE — Progress Notes (Signed)
9 l;'

## 2018-05-20 NOTE — Patient Instructions (Signed)
Good to see you today  Weekly, place a cotton ball with sweet gum oil or mineral oil in your ear for 5-10 minutes to keep wax soft   Earwax Buildup, Adult The ears produce a substance called earwax that helps keep bacteria out of the ear and protects the skin in the ear canal. Occasionally, earwax can build up in the ear and cause discomfort or hearing loss. What increases the risk? This condition is more likely to develop in people who:  Are female.  Are elderly.  Naturally produce more earwax.  Clean their ears often with cotton swabs.  Use earplugs often.  Use in-ear headphones often.  Wear hearing aids.  Have narrow ear canals.  Have earwax that is overly thick or sticky.  Have eczema.  Are dehydrated.  Have excess hair in the ear canal.  What are the signs or symptoms? Symptoms of this condition include:  Reduced or muffled hearing.  A feeling of fullness in the ear or feeling that the ear is plugged.  Fluid coming from the ear.  Ear pain.  Ear itch.  Ringing in the ear.  Coughing.  An obvious piece of earwax that can be seen inside the ear canal.  How is this diagnosed? This condition may be diagnosed based on:  Your symptoms.  Your medical history.  An ear exam. During the exam, your health care provider will look into your ear with an instrument called an otoscope.  You may have tests, including a hearing test. How is this treated? This condition may be treated by:  Using ear drops to soften the earwax.  Having the earwax removed by a health care provider. The health care provider may: ? Flush the ear with water. ? Use an instrument that has a loop on the end (curette). ? Use a suction device.  Surgery to remove the wax buildup. This may be done in severe cases.  Follow these instructions at home:  Take over-the-counter and prescription medicines only as told by your health care provider.  Do not put any objects, including cotton  swabs, into your ear. You can clean the opening of your ear canal with a washcloth or facial tissue.  Follow instructions from your health care provider about cleaning your ears. Do not over-clean your ears.  Drink enough fluid to keep your urine clear or pale yellow. This will help to thin the earwax.  Keep all follow-up visits as told by your health care provider. If earwax builds up in your ears often or if you use hearing aids, consider seeing your health care provider for routine, preventive ear cleanings. Ask your health care provider how often you should schedule your cleanings.  If you have hearing aids, clean them according to instructions from the manufacturer and your health care provider. Contact a health care provider if:  You have ear pain.  You develop a fever.  You have blood, pus, or other fluid coming from your ear.  You have hearing loss.  You have ringing in your ears that does not go away.  Your symptoms do not improve with treatment.  You feel like the room is spinning (vertigo). Summary  Earwax can build up in the ear and cause discomfort or hearing loss.  The most common symptoms of this condition include reduced or muffled hearing and a feeling of fullness in the ear or feeling that the ear is plugged.  This condition may be diagnosed based on your symptoms, your medical history, and  an ear exam.  This condition may be treated by using ear drops to soften the earwax or by having the earwax removed by a health care provider.  Do not put any objects, including cotton swabs, into your ear. You can clean the opening of your ear canal with a washcloth or facial tissue. This information is not intended to replace advice given to you by your health care provider. Make sure you discuss any questions you have with your health care provider. Document Released: 08/03/2004 Document Revised: 09/06/2016 Document Reviewed: 09/06/2016 Elsevier Interactive Patient  Education  [employer].

## 2018-05-20 NOTE — Progress Notes (Signed)
Subjective:    Patient ID: Kathleen Paul, female    DOB: [birth year], 82 y.o.   MRN: [id number]  HPI This is an 82 yo female, accompanied by her daughter, who presents today with decreased hearing, vertigo x 3 days. Denies any fever, chills, cough, sore throat. Has had sensation of spinning and nausea off and on x 3 days. Has had similar symptoms in past but not as severe with cerumen buildup.   Home blood pressures in 120/70s.   Past Medical History:  Diagnosis Date  . Anxiety state, unspecified   . Arthritis   . Atherosclerosis   . Diverticulosis   . Esophageal stricture   . Essential hypertension, benign   . Gastric ulcer   . Hiatal hernia   . History of DVT (deep vein thrombosis)    legs  . IDA (iron deficiency anemia)   . Inguinal hernia without mention of obstruction or gangrene, unilateral or unspecified, (not specified as recurrent)    2014  . Internal hemorrhoids   . Mild mitral regurgitation    a. 04/2010 Echo: nl EF, mild diast dysfxn, trace PR, mild TR, mild-mod MR.  . Non-obstructive CAD    a. 04/2010 Myoview: EF 77%, fixed apical/inferior defect;  b. 04/2010 Cath: LAD 69m, LCX/RCA minor irregs, EF 60%;  c. 04/2011 Myoview: low risk.  . Pure hypercholesterolemia   . Scoliosis   . UI (urinary incontinence)   . Umbilical hernia without mention of obstruction or gangrene    2014  . Unspecified hypothyroidism   . Vitamin D deficiency    Past Surgical History:  Procedure Laterality Date  . ABDOMINAL HYSTERECTOMY  2008  . BIOPSY  04/02/2018   Procedure: BIOPSY;  Surgeon: Rush Landmark Telford Nab., MD;  Location: [facility];  Service: Gastroenterology;;  . CARDIAC CATHETERIZATION  2011   [facility]  . COLONOSCOPY  2003   [facility]  . ESOPHAGOGASTRODUODENOSCOPY (EGD) WITH PROPOFOL N/A 04/02/2018   Procedure: ESOPHAGOGASTRODUODENOSCOPY (EGD) WITH PROPOFOL;  Surgeon: Rush Landmark Telford Nab., MD;  Location: [facility];  Service: Gastroenterology;  Laterality:  N/A;  . FOREIGN BODY REMOVAL  04/02/2018   Procedure: FOREIGN BODY REMOVAL;  Surgeon: Rush Landmark Telford Nab., MD;  Location: [facility];  Service: Gastroenterology;;  . HERNIA REPAIR Right 09/23/2012   repair [facility]  . LAPAROSCOPIC HYSTERECTOMY    . TONSILLECTOMY     Family History  Problem Relation Age of Onset  . Heart disease Mother   . Heart attack Mother   . Heart disease Father   . Heart attack Father   . Breast cancer Sister   . Breast cancer Maternal Aunt   . Heart disease Brother        sister   Social History   Tobacco Use  . Smoking status: Former Smoker    Packs/day: 0.50    Years: 30.00    Pack years: 15.00    Types: Cigarettes    Last attempt to quit: 07/10/1988    Years since quitting: 29.8  . Smokeless tobacco: Never Used  Substance Use Topics  . Alcohol use: No  . Drug use: Never     Review of Systems Per HPI    Objective:   Physical Exam  Constitutional: She is oriented to person, place, and time. She appears well-developed and well-nourished. No distress.  HENT:  Head: Normocephalic and atraumatic.  Bilateral ear canals impacted with cerumen, irrigated with warm water with large amount of cerumen returned. Patient with immediate improvement of hearing.  Eyes: Conjunctivae are normal.  Cardiovascular: Normal rate, regular rhythm and normal heart sounds.  Pulmonary/Chest: Effort normal and breath sounds normal.  Musculoskeletal: She exhibits no edema.  Steady gait.   Neurological: She is alert and oriented to person, place, and time.  Skin: Skin is warm and dry. She is not diaphoretic.  Vitals reviewed.     BP (!) 156/74 (BP Location: Left Arm, Patient Position: Sitting, Cuff Size: Normal)   Pulse (!) 53   Temp 98.5 F (36.9 C) (Oral)   Ht 5' (1.524 m)   Wt 151 lb 6.4 oz (68.7 kg)   SpO2 97%   BMI 29.57 kg/m  BP Readings from Last 3 Encounters:  05/20/18 (!) 156/74  04/18/18 (!) 142/74  04/05/18 (!) 141/75       Assessment & Plan:   1. Bilateral impacted cerumen - improved with irrigation - Provided written and verbal information regarding diagnosis and treatment. - RTC precautions reveiwed  2. Vertigo - likely related to #1, if no improvement following cerumen removal, RTC  3. Essential hypertension - BP a little high today, previously in desirable range, patient and her daughter report good readings at home   Clarene Reamer, FNP-BC  [place] [facility], [place]  05/20/2018 12:39 PM

## 2018-05-21 ENCOUNTER — Ambulatory Visit (INDEPENDENT_AMBULATORY_CARE_PROVIDER_SITE_OTHER): Payer: PPO | Admitting: Internal Medicine

## 2018-05-21 ENCOUNTER — Encounter: Payer: Self-pay | Admitting: Internal Medicine

## 2018-05-21 VITALS — BP 162/62 | HR 56 | Ht 59.75 in | Wt 151.1 lb

## 2018-05-21 DIAGNOSIS — K222 Esophageal obstruction: Secondary | ICD-10-CM

## 2018-05-21 DIAGNOSIS — K2100 Gastro-esophageal reflux disease with esophagitis, without bleeding: Secondary | ICD-10-CM

## 2018-05-21 DIAGNOSIS — K449 Diaphragmatic hernia without obstruction or gangrene: Secondary | ICD-10-CM | POA: Diagnosis not present

## 2018-05-21 MED ORDER — OMEPRAZOLE 40 MG PO CPDR: 40.0000 mg | DELAYED_RELEASE_CAPSULE | Freq: Two times a day (BID) | ORAL | 1 refills | Status: DC

## 2018-05-21 NOTE — Progress Notes (Signed)
Subjective:    Patient ID: Kathleen Paul, female    DOB: June 07, 1936, 82 y.o.   MRN: [id number]  HPI [name] is an 82 yo female with a past medical history of GERD, Schatzki's ring, hiatal hernia, recent food impaction requiring urgent upper endoscopy with Dr. Rush Landmark, vitamin D deficiency, hypercholesterolemia, diverticulosis who is seen in follow-up.  She is here today with her daughter.  Again she saw Dr. Rush Landmark urgently for esophageal food impaction which occurred on 04/02/2018.  She was brought for urgent upper endoscopy which showed esophageal obstruction with chicken.  This was removed and after removal revealed LA grade C reflux esophagitis.  A benign-appearing stenosis 31 cm from the incisors.  Stenosis was short, less than a centimeter.  She had a 4 to 5 cm hiatal hernia.  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  Biopsies were performed in the esophagus there was no significant pathologic change.  In the stomach there was chronic inflammation and focal edema without H. pylori, dysplasia or malignancy  She reports today that her food impaction was "my fault" because she had been off of her ranitidine.  She had heard that this medication may be linked to cancer and so she stopped it about a month before her presentation.  Before her food impaction she was having significant solid dysphagia for several weeks.  Since endoscopy she is been on omeprazole 40 mg twice daily and has had resolution of her dysphagia.  No odynophagia.  Reports good control of heartburn.  No abdominal pain.  No nausea or vomiting.  No change in bowel habits.  No blood in her stool or melena.   Review of Systems As per HPI, otherwise negative  Current Medications, Allergies, Past Medical History, Past Surgical History, Family History and Social History were reviewed in [employer] record.     Objective:   Physical  Exam BP (!) 162/62 (BP Location: Left Arm, Patient Position: Sitting, Cuff Size: Normal)   Pulse (!) 56   Ht 4' 11.75" (1.518 m) Comment: height measured without shoes  Wt 151 lb 2 oz (68.5 kg)   BMI 29.76 kg/m  Gen: awake, alert, NAD HEENT: anicteric, op clear CV: RRR, no mrg Pulm: CTA b/l Abd: soft, NT/ND, +BS throughout Ext: no c/c/e Neuro: nonfocal   CBC    Component Value Date/Time   WBC 4.2 03/26/2018 0750   RBC 3.19 (L) 03/26/2018 0750   HGB 11.6 (L) 04/02/2018 1318   HGB 11.4 (L) 09/17/2012 1040   HCT 34.0 (L) 04/02/2018 1318   HCT 33.9 (L) 09/17/2012 1040   PLT 213.0 03/26/2018 0750   PLT 223 09/17/2012 1040   MCV 94.2 03/26/2018 0750   MCV 96 09/17/2012 1040   MCH 31.3 01/31/2018 0937   MCHC 34.2 03/26/2018 0750   RDW 14.1 03/26/2018 0750   RDW 13.7 03/17/2014 1410   RDW 12.6 09/17/2012 1040   LYMPHSABS 797 (L) 01/31/2018 0937   LYMPHSABS 1.5 03/17/2014 1410   LYMPHSABS 1.2 09/17/2012 1040   MONOABS 0.6 11/09/2014 1639   MONOABS 0.6 09/17/2012 1040   EOSABS 82 01/31/2018 0937   EOSABS 0.1 03/17/2014 1410   EOSABS 0.1 09/17/2012 1040   BASOSABS 38 01/31/2018 0937   BASOSABS 0.0 03/17/2014 1410   BASOSABS 0.1 09/17/2012 1040        Assessment & Plan:   82 yo female with a past medical history of GERD, Schatzki's  ring, hiatal hernia, recent food impaction requiring urgent upper endoscopy with Dr. Rush Landmark, vitamin D deficiency, hypercholesterolemia, diverticulosis who is seen in follow-up.   1.  GERD with esophagitis/Schatzki's ring with food impaction/hiatal hernia --symptoms have improved on acid suppression therapy.  She has endoscopy scheduled for 05/29/2018 at 9:30 AM.  We discussed this at length including the risk, benefits and alternatives to repeat endoscopy.  I recommend proceeding with endoscopy to confirm improvement in esophagitis but also probable dilation at the GE junction for her Schatzki's ring, particularly in light of recent food  impaction.  Based on these findings we will likely reduce omeprazole to 40 mg once daily going forward.  In the interim she will continue to chew her food well, eat slowly and take small bites.

## 2018-05-21 NOTE — Patient Instructions (Signed)
You have been scheduled for an endoscopy. Please follow written instructions given to you at your visit today. If you use inhalers (even only as needed), please bring them with you on the day of your procedure. Your physician has requested that you go to www.startemmi.com and enter the access code given to you at your visit today. This web site gives a general overview about your procedure. However, you should still follow specific instructions given to you by our office regarding your preparation for the procedure.  We have sent the following medications to your pharmacy for you to pick up at your convenience: Omeprazole 40 mg twice daily before meals   If you are age 71 or older, your body mass index should be between 23-30. Your Body mass index is 29.76 kg/m. If this is out of the aforementioned range listed, please consider follow up with your Primary Care Provider.  If you are age 83 or younger, your body mass index should be between 19-25. Your Body mass index is 29.76 kg/m. If this is out of the aformentioned range listed, please consider follow up with your Primary Care Provider.

## 2018-05-27 ENCOUNTER — Telehealth: Payer: Self-pay | Admitting: Internal Medicine

## 2018-05-29 ENCOUNTER — Encounter: Payer: PPO | Admitting: Internal Medicine

## 2018-05-30 ENCOUNTER — Other Ambulatory Visit: Payer: Self-pay | Admitting: Primary Care

## 2018-05-30 DIAGNOSIS — M19012 Primary osteoarthritis, left shoulder: Secondary | ICD-10-CM

## 2018-05-30 DIAGNOSIS — M19042 Primary osteoarthritis, left hand: Principal | ICD-10-CM

## 2018-05-30 DIAGNOSIS — M19071 Primary osteoarthritis, right ankle and foot: Secondary | ICD-10-CM

## 2018-05-30 DIAGNOSIS — M19041 Primary osteoarthritis, right hand: Secondary | ICD-10-CM

## 2018-05-30 DIAGNOSIS — M19072 Primary osteoarthritis, left ankle and foot: Secondary | ICD-10-CM

## 2018-05-30 DIAGNOSIS — M170 Bilateral primary osteoarthritis of knee: Secondary | ICD-10-CM

## 2018-05-30 DIAGNOSIS — M19011 Primary osteoarthritis, right shoulder: Secondary | ICD-10-CM

## 2018-05-30 NOTE — Telephone Encounter (Signed)
Noted, prescription for every other day dosing of diclofenac tablets sent to pharmacy. She needs repeat renal function testing in 2 months, please schedule lab only appointment.

## 2018-05-30 NOTE — Telephone Encounter (Signed)
Last prescribed on 04/18/2018  Last office visit on 05/20/2018 with Debbie.

## 2018-06-27 DIAGNOSIS — C4401 Basal cell carcinoma of skin of lip: Secondary | ICD-10-CM | POA: Diagnosis not present

## 2018-07-22 NOTE — Telephone Encounter (Signed)
Done

## 2018-07-30 ENCOUNTER — Other Ambulatory Visit: Payer: Self-pay | Admitting: Primary Care

## 2018-07-30 DIAGNOSIS — E039 Hypothyroidism, unspecified: Secondary | ICD-10-CM

## 2018-07-30 DIAGNOSIS — N289 Disorder of kidney and ureter, unspecified: Secondary | ICD-10-CM

## 2018-07-30 DIAGNOSIS — E785 Hyperlipidemia, unspecified: Secondary | ICD-10-CM

## 2018-08-05 ENCOUNTER — Other Ambulatory Visit (INDEPENDENT_AMBULATORY_CARE_PROVIDER_SITE_OTHER): Payer: PPO

## 2018-08-05 ENCOUNTER — Ambulatory Visit: Payer: PPO | Admitting: Podiatry

## 2018-08-05 DIAGNOSIS — N289 Disorder of kidney and ureter, unspecified: Secondary | ICD-10-CM | POA: Diagnosis not present

## 2018-08-05 DIAGNOSIS — E785 Hyperlipidemia, unspecified: Secondary | ICD-10-CM | POA: Diagnosis not present

## 2018-08-05 DIAGNOSIS — E039 Hypothyroidism, unspecified: Secondary | ICD-10-CM

## 2018-08-05 LAB — COMPREHENSIVE METABOLIC PANEL
ALK PHOS: 68 U/L (ref 39–117)
ALT: 9 U/L (ref 0–35)
AST: 17 U/L (ref 0–37)
Albumin: 4.2 g/dL (ref 3.5–5.2)
BUN: 37 mg/dL — ABNORMAL HIGH (ref 6–23)
CO2: 24 mEq/L (ref 19–32)
CREATININE: 1.63 mg/dL — AB (ref 0.40–1.20)
Calcium: 9.1 mg/dL (ref 8.4–10.5)
Chloride: 99 mEq/L (ref 96–112)
GFR: 30.3 mL/min — ABNORMAL LOW (ref >60.00)
Glucose, Bld: 80 mg/dL (ref 70–99)
Potassium: 4.4 mEq/L (ref 3.5–5.1)
Sodium: 134 mEq/L — ABNORMAL LOW (ref 135–145)
Total Bilirubin: 0.6 mg/dL (ref 0.2–1.2)
Total Protein: 6.9 g/dL (ref 6.0–8.3)

## 2018-08-05 LAB — LIPID PANEL
CHOLESTEROL: 131 mg/dL (ref 0–200)
HDL: 66.6 mg/dL (ref >39.00)
LDL Cholesterol: 51 mg/dL (ref 0–99)
NonHDL: 64.72
Total CHOL/HDL Ratio: 2
Triglycerides: 69 mg/dL (ref 0.0–149.0)
VLDL: 13.8 mg/dL (ref 0.0–40.0)

## 2018-08-05 LAB — TSH: TSH: 2.79 u[IU]/mL (ref 0.35–4.50)

## 2018-08-05 LAB — BASIC METABOLIC PANEL
BUN: 37 mg/dL — ABNORMAL HIGH (ref 6–23)
CO2: 24 mEq/L (ref 19–32)
CREATININE: 1.63 mg/dL — AB (ref 0.40–1.20)
Calcium: 9.1 mg/dL (ref 8.4–10.5)
Chloride: 99 mEq/L (ref 96–112)
GFR: 30.3 mL/min — ABNORMAL LOW (ref >60.00)
Glucose, Bld: 80 mg/dL (ref 70–99)
Potassium: 4.4 mEq/L (ref 3.5–5.1)
Sodium: 134 mEq/L — ABNORMAL LOW (ref 135–145)

## 2018-08-08 ENCOUNTER — Other Ambulatory Visit: Payer: Self-pay | Admitting: Primary Care

## 2018-08-08 DIAGNOSIS — M19041 Primary osteoarthritis, right hand: Secondary | ICD-10-CM

## 2018-08-08 DIAGNOSIS — M19072 Primary osteoarthritis, left ankle and foot: Secondary | ICD-10-CM

## 2018-08-08 DIAGNOSIS — M170 Bilateral primary osteoarthritis of knee: Secondary | ICD-10-CM

## 2018-08-08 DIAGNOSIS — M19042 Primary osteoarthritis, left hand: Secondary | ICD-10-CM

## 2018-08-08 DIAGNOSIS — M19071 Primary osteoarthritis, right ankle and foot: Secondary | ICD-10-CM

## 2018-08-08 MED ORDER — GABAPENTIN 100 MG PO CAPS: ORAL_CAPSULE | ORAL | 0 refills | Status: DC

## 2018-08-11 ENCOUNTER — Other Ambulatory Visit: Payer: Self-pay | Admitting: Primary Care

## 2018-08-11 DIAGNOSIS — E785 Hyperlipidemia, unspecified: Secondary | ICD-10-CM

## 2018-08-11 DIAGNOSIS — I10 Essential (primary) hypertension: Secondary | ICD-10-CM

## 2018-08-15 ENCOUNTER — Ambulatory Visit: Payer: PPO | Admitting: Podiatry

## 2018-08-17 ENCOUNTER — Other Ambulatory Visit: Payer: Self-pay | Admitting: Gastroenterology

## 2018-08-19 ENCOUNTER — Ambulatory Visit: Payer: PPO | Admitting: Podiatry

## 2018-08-22 ENCOUNTER — Encounter: Payer: Self-pay | Admitting: Podiatry

## 2018-08-22 ENCOUNTER — Ambulatory Visit (INDEPENDENT_AMBULATORY_CARE_PROVIDER_SITE_OTHER): Payer: PPO | Admitting: Podiatry

## 2018-08-22 DIAGNOSIS — B351 Tinea unguium: Secondary | ICD-10-CM | POA: Diagnosis not present

## 2018-08-22 DIAGNOSIS — L84 Corns and callosities: Secondary | ICD-10-CM | POA: Diagnosis not present

## 2018-08-22 DIAGNOSIS — M79676 Pain in unspecified toe(s): Secondary | ICD-10-CM

## 2018-08-22 NOTE — Progress Notes (Signed)
Complaint:  Visit Type: Patient returns to my office for continued preventative foot care services. Complaint: Patient states" my nails have grown long and thick and become painful to walk and wear shoes"  The patient presents for preventative foot care services. No changes to ROS. Patient has painful callus right foot.   Podiatric Exam: Vascular: dorsalis pedis and posterior tibial pulses are palpable bilateral. Capillary return is immediate. Temperature gradient is WNL. Skin turgor WNL  Sensorium: Normal Semmes Weinstein monofilament test. Normal tactile sensation bilaterally. Nail Exam: Pt has thick disfigured discolored nails with subungual debris noted bilateral entire nail hallux through fifth toenails Ulcer Exam: There is no evidence of ulcer or pre-ulcerative changes or infection. Orthopedic Exam: Muscle tone and strength are WNL. No limitations in general ROM. No crepitus or effusions noted. Foot type and digits show no abnormalities. 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 Skin: No Porokeratosis. No infection or ulcers.  Heloma durum 5th toe right symptomatic.  Porokeratosis sub 5th met B/L asymptomatic.    Diagnosis:  Onychomycosis, , Pain in right toe, pain in left toes  Heloma durum fifth toe right foot.  Treatment & Plan Procedures and Treatment: Consent by patient was obtained for treatment procedures. The patient understood the discussion of treatment and procedures well. All questions were answered thoroughly reviewed. Debridement of mycotic and hypertrophic toenails, 1 through 5 bilateral and clearing of subungual debris. No ulceration, no infection noted. Debride corn. Return Visit-Office Procedure: Patient instructed to return to the office for a follow up visit 3 months for continued evaluation and treatment.    Gardiner Barefoot DPM

## 2018-08-30 ENCOUNTER — Other Ambulatory Visit: Payer: Self-pay | Admitting: Primary Care

## 2018-08-30 DIAGNOSIS — M19072 Primary osteoarthritis, left ankle and foot: Secondary | ICD-10-CM

## 2018-08-30 DIAGNOSIS — M19042 Primary osteoarthritis, left hand: Principal | ICD-10-CM

## 2018-08-30 DIAGNOSIS — M19071 Primary osteoarthritis, right ankle and foot: Secondary | ICD-10-CM

## 2018-08-30 DIAGNOSIS — M19011 Primary osteoarthritis, right shoulder: Secondary | ICD-10-CM

## 2018-08-30 DIAGNOSIS — M19041 Primary osteoarthritis, right hand: Secondary | ICD-10-CM

## 2018-08-30 DIAGNOSIS — M19012 Primary osteoarthritis, left shoulder: Secondary | ICD-10-CM

## 2018-08-30 DIAGNOSIS — M170 Bilateral primary osteoarthritis of knee: Secondary | ICD-10-CM

## 2018-09-13 ENCOUNTER — Other Ambulatory Visit: Payer: Self-pay | Admitting: Primary Care

## 2018-09-13 ENCOUNTER — Ambulatory Visit (INDEPENDENT_AMBULATORY_CARE_PROVIDER_SITE_OTHER): Payer: PPO | Admitting: Primary Care

## 2018-09-13 ENCOUNTER — Encounter: Payer: Self-pay | Admitting: Primary Care

## 2018-09-13 VITALS — BP 134/68 | HR 56 | Temp 97.7°F | Ht 59.75 in | Wt 150.8 lb

## 2018-09-13 DIAGNOSIS — M19011 Primary osteoarthritis, right shoulder: Secondary | ICD-10-CM

## 2018-09-13 DIAGNOSIS — M19012 Primary osteoarthritis, left shoulder: Secondary | ICD-10-CM

## 2018-09-13 DIAGNOSIS — N289 Disorder of kidney and ureter, unspecified: Secondary | ICD-10-CM

## 2018-09-13 DIAGNOSIS — M170 Bilateral primary osteoarthritis of knee: Secondary | ICD-10-CM | POA: Diagnosis not present

## 2018-09-13 DIAGNOSIS — I10 Essential (primary) hypertension: Secondary | ICD-10-CM | POA: Diagnosis not present

## 2018-09-13 LAB — BASIC METABOLIC PANEL
BUN: 45 mg/dL — AB (ref 6–23)
CO2: 24 mEq/L (ref 19–32)
Calcium: 9 mg/dL (ref 8.4–10.5)
Chloride: 100 mEq/L (ref 96–112)
Creatinine, Ser: 1.94 mg/dL — ABNORMAL HIGH (ref 0.40–1.20)
GFR: 24.78 mL/min — ABNORMAL LOW (ref >60.00)
Glucose, Bld: 81 mg/dL (ref 70–99)
Potassium: 4.4 mEq/L (ref 3.5–5.1)
Sodium: 136 mEq/L (ref 135–145)

## 2018-09-13 MED ORDER — ENALAPRIL MALEATE 20 MG PO TABS: 20.0000 mg | ORAL_TABLET | Freq: Every day | ORAL | 0 refills | Status: DC

## 2018-09-13 MED ORDER — DICLOFENAC SODIUM 75 MG PO TBEC: 75.0000 mg | DELAYED_RELEASE_TABLET | ORAL | 0 refills | Status: DC

## 2018-09-13 NOTE — Telephone Encounter (Signed)
Please advise. Patient request 90 days

## 2018-09-13 NOTE — Progress Notes (Signed)
Subjective:    Patient ID: Kathleen Paul, female    DOB: [birth year], 83 y.o.   MRN: [id number]  HPI  Kathleen Paul is an 83 year old female who presents today for follow up of osteoarthritis. 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   Previously following with rheumatology and managed on Plaquenil without improvement. She's failed Tylenol, knee injections, and other OTC treatments. She's also tried diclofenac every other day which provided a moderate amount of relief but this caused decrease in renal function. She is following with nephology and has an appointment next month. She is managed on enalapril-HCTZ for hypertension.   She was most recently placed on gabapentin 100 mg up to three times daily which caused moderate drowsiness, lower extremity edema, and without reduction of pain. She didn't like the way she felt. She took this for about 2 weeks total.   She continues to experience chronic pain to her bilateral shoulders and knees which has been debilitating. She would like to resume diclofenac as she has near complete resolve in symptoms and is able to move and go about her normal activities. Without her diclofenac she is "paralyzed" and unable to move much. She's checking her BP at home which is running 120's/60's-70's.   BP Readings from Last 3 Encounters:  09/13/18 134/68  05/21/18 (!) 162/62  05/20/18 (!) 156/74     Review of Systems  Respiratory: Negative for shortness of breath.   Cardiovascular: Negative for chest pain.  Musculoskeletal: Positive for arthralgias. Negative for joint swelling.  Skin: Negative for color change.       Past Medical History:  Diagnosis Date  . Anxiety state, unspecified   . Arthritis   . Atherosclerosis   . Diverticulosis   . Esophageal stricture   . Essential hypertension, benign   . Gastric ulcer   . Hiatal hernia   . History of DVT (deep vein thrombosis)    legs  . IDA (iron deficiency anemia)   . Inguinal hernia  without mention of obstruction or gangrene, unilateral or unspecified, (not specified as recurrent)    2014  . Internal hemorrhoids   . Mild mitral regurgitation    a. 04/2010 Echo: nl EF, mild diast dysfxn, trace PR, mild TR, mild-mod MR.  . Non-obstructive CAD    a. 04/2010 Myoview: EF 77%, fixed apical/inferior defect;  b. 04/2010 Cath: LAD 46m, LCX/RCA minor irregs, EF 60%;  c. 04/2011 Myoview: low risk.  . Pure hypercholesterolemia   . Scoliosis   . UI (urinary incontinence)   . Umbilical hernia without mention of obstruction or gangrene    2014  . Unspecified hypothyroidism   . Vitamin D deficiency      Social History   Socioeconomic History  . Marital status: Widowed    Spouse name: Not on file  . Number of children: Not on file  . Years of education: Not on file  . Highest education level: Not on file  Occupational History  . Not on file  Social Needs  . Financial resource strain: Not on file  . Food insecurity:    Worry: Not on file    Inability: Not on file  . Transportation needs:    Medical: Not on file    Non-medical: Not on file  Tobacco Use  . Smoking status: Former Smoker    Packs/day: 0.50    Years: 30.00    Pack years: 15.00    Types: Cigarettes    Last attempt to quit:  07/10/1988    Years since quitting: 30.1  . Smokeless tobacco: Never Used  Substance and Sexual Activity  . Alcohol use: No  . Drug use: Never  . Sexual activity: Not on file  Lifestyle  . Physical activity:    Days per week: Not on file    Minutes per session: Not on file  . Stress: Not on file  Relationships  . Social connections:    Talks on phone: Not on file    Gets together: Not on file    Attends religious service: Not on file    Active member of club or organization: Not on file    Attends meetings of clubs or organizations: Not on file    Relationship status: Not on file  . Intimate partner violence:    Fear of current or ex partner: Not on file    Emotionally  abused: Not on file    Physically abused: Not on file    Forced sexual activity: Not on file  Other Topics Concern  . Not on file  Social History Narrative  . Not on file    Past Surgical History:  Procedure Laterality Date  . ABDOMINAL HYSTERECTOMY  2008  . BIOPSY  04/02/2018   Procedure: BIOPSY;  Surgeon: Rush Landmark Telford Nab., MD;  Location: [facility];  Service: Gastroenterology;;  . CARDIAC CATHETERIZATION  2011   [facility]  . COLONOSCOPY  2003   [facility]  . ESOPHAGOGASTRODUODENOSCOPY (EGD) WITH PROPOFOL N/A 04/02/2018   Procedure: ESOPHAGOGASTRODUODENOSCOPY (EGD) WITH PROPOFOL;  Surgeon: Rush Landmark Telford Nab., MD;  Location: [facility];  Service: Gastroenterology;  Laterality: N/A;  . FOREIGN BODY REMOVAL  04/02/2018   Procedure: FOREIGN BODY REMOVAL;  Surgeon: Rush Landmark Telford Nab., MD;  Location: [facility];  Service: Gastroenterology;;  . HERNIA REPAIR Right 09/23/2012   repair [facility]  . LAPAROSCOPIC HYSTERECTOMY    . TONSILLECTOMY      Family History  Problem Relation Age of Onset  . Heart disease Mother   . Heart attack Mother   . Heart disease Father   . Heart attack Father   . Breast cancer Sister   . Breast cancer Maternal Aunt   . Heart disease Brother        sister    Allergies  Allergen Reactions  . Codeine Nausea Only    Current Outpatient Medications on File Prior to Visit  Medication Sig Dispense Refill  . aspirin EC 81 MG tablet Take by mouth.    . Biotin 1000 MCG tablet Take 1,000 mcg by mouth daily.    . Calcium Carbonate-Vitamin D 600-200 MG-UNIT TABS Take by mouth.    . enalapril-hydrochlorothiazide (VASERETIC) 10-25 MG tablet TAKE 1 TABLET BY MOUTH DAILY 90 tablet 3  . levothyroxine (SYNTHROID, LEVOTHROID) 88 MCG tablet TAKE 1 TABLET BY MOUTH EVERY MORNING ON AN EMPTY STOMACH AND WITH A FULL GLASS OF WATER 90 tablet 1  . metoprolol tartrate (LOPRESSOR) 25 MG tablet TAKE 1 TABLET BY MOUTH EVERY EVENING 90 tablet 3  . Multiple  Vitamin (ONE-A-DAY ESSENTIAL) TABS Take by mouth.    Marland Kitchen omeprazole (PRILOSEC) 40 MG capsule Take 1 capsule (40 mg total) by mouth 2 (two) times daily before a meal. 180 capsule 1  . OVER THE COUNTER MEDICATION Apply 1 application topically daily as needed (pain). OxyRub Cream    . simvastatin (ZOCOR) 20 MG tablet Take 1 tablet by mouth once daily for cholesterol. 90 tablet 3   No current facility-administered medications on file prior  to visit.     BP 134/68   Pulse (!) 56   Temp 97.7 F (36.5 C) (Oral)   Ht 4' 11.75" (1.518 m)   Wt 150 lb 12 oz (68.4 kg)   SpO2 94%   BMI 29.69 kg/m    Objective:   Physical Exam  Constitutional: She appears well-nourished.  Neck: Neck supple.  Cardiovascular: Normal rate and regular rhythm.  Respiratory: Effort normal and breath sounds normal.  Skin: Skin is warm and dry.           Assessment & Plan:

## 2018-09-13 NOTE — Assessment & Plan Note (Addendum)
Osteoarthritis is debilitating without diclofenac. We had a long discussion regarding quality of life vs renal function and she would like to resume diclofenac every other day. She understands the potential risks for decline in renal function. She will be seeing nephrology next month.   BMP pending today, also repeat in 2 weeks.

## 2018-09-13 NOTE — Assessment & Plan Note (Addendum)
Decreases with NSAID's, but also on HCTZ which could be causing more insult to injury.  Will eliminate HCTZ to see if this helps with overall function. Continue ACE.   Osteoarthritis is debilitating without diclofenac. We had a long discussion regarding quality of life vs renal function and she would like to resume diclofenac every other day. She understands the potential risks for decline in renal function. She will be seeing nephrology next month.   Repeat BMP pending. Follow up in 2 weeks for BP and BMP check.

## 2018-09-13 NOTE — Telephone Encounter (Signed)
Noted. Changed to 90 day supply.

## 2018-09-13 NOTE — Addendum Note (Signed)
Addended by: [name] on: 09/13/2018 12:09 PM   Modules accepted: Orders

## 2018-09-13 NOTE — Patient Instructions (Signed)
Stop taking enalapril-hydrochlorothiazide 10-25 mg for blood pressure.  Start enalapril 20 mg tablets for blood pressure.  You may resume your diclofenac every other day for arthritis.   Stop by the lab prior to leaving today. I will notify you of your results once received.   Schedule a follow up visit for 2 weeks for blood pressure check and kidney check.  It was a pleasure to see you today!

## 2018-09-13 NOTE — Assessment & Plan Note (Signed)
Stable in the office today. In an effort to preserve renal function, will stop HCTZ as this may be too taxing on the kidneys. We will need to increase enalapril to 20 mg and closely monitor.   BMP pending for today.

## 2018-09-13 NOTE — Assessment & Plan Note (Signed)
Osteoarthritis is debilitating without diclofenac. We had a long discussion regarding quality of life vs renal function and she would like to resume diclofenac every other day. She understands the potential risks for decline in renal function. She will be seeing nephrology next month.   BMP pending today, and in 2 weeks.

## 2018-09-17 ENCOUNTER — Telehealth: Payer: Self-pay

## 2018-09-17 NOTE — Telephone Encounter (Addendum)
Spoken to nurse at [facility]. Inform that we have faxed the lab results with the medical release to their office. I was told, they will take a look at the labs.  I was also told that they will look at the labs and will reschedule appointment to be sooner if they believe in doing so. They will contact patient.  [name] recommended patient to be seen sooner than in April but [facility] would not reschedule without lab results.

## 2018-09-17 NOTE — Telephone Encounter (Signed)
Pt left v/m; kidney doctor will not change pts appt any sooner until they receive lab results for the last year. See lab results note for 09/13/18. Pt request cb.

## 2018-09-17 NOTE — Telephone Encounter (Signed)
Spoken to patient. Patient stated that she see [facility] Phone: [phone]. I have asked patient to come in and sign medical release form so we can release records.

## 2018-09-23 ENCOUNTER — Telehealth: Payer: Self-pay | Admitting: *Deleted

## 2018-09-23 NOTE — Telephone Encounter (Signed)
Message left for patient to return my call.  

## 2018-09-23 NOTE — Telephone Encounter (Signed)
For her age this is still considered stable. Have her continue to monitor and call us if she consistently sees readings at or above 140/90 (either number). Also, did she hear back regarding the kidney doctor? Her kidney function is worse than ever.

## 2018-09-23 NOTE — Telephone Encounter (Signed)
Patient called stating that she has concerns about her blood pressure because it has been elevated. Patient stated the last two times she checked her blood pressure it has high 135/68 and 146/82. Patient stated that she was to monitor her blood pressure since her medication was recently changed. Patient stated that she is not having any symptoms.Patient requested a call back.

## 2018-09-24 NOTE — Telephone Encounter (Signed)
Message left for patient to return my call.  

## 2018-09-25 DIAGNOSIS — N183 Chronic kidney disease, stage 3 (moderate): Secondary | ICD-10-CM | POA: Diagnosis not present

## 2018-09-25 DIAGNOSIS — I129 Hypertensive chronic kidney disease with stage 1 through stage 4 chronic kidney disease, or unspecified chronic kidney disease: Secondary | ICD-10-CM | POA: Diagnosis not present

## 2018-09-25 NOTE — Telephone Encounter (Signed)
Spoken and notified patient of Kathleen Millers comments. Patient verbalized understanding.  Patient stated that her BP reading are better in the 120/78. She is going to see the kidney doctor this afternoon.

## 2018-09-25 NOTE — Telephone Encounter (Signed)
Noted  

## 2018-09-26 ENCOUNTER — Telehealth: Payer: Self-pay

## 2018-09-26 DIAGNOSIS — M19012 Primary osteoarthritis, left shoulder: Principal | ICD-10-CM

## 2018-09-26 DIAGNOSIS — M19072 Primary osteoarthritis, left ankle and foot: Secondary | ICD-10-CM

## 2018-09-26 DIAGNOSIS — M170 Bilateral primary osteoarthritis of knee: Secondary | ICD-10-CM

## 2018-09-26 DIAGNOSIS — M19042 Primary osteoarthritis, left hand: Secondary | ICD-10-CM

## 2018-09-26 DIAGNOSIS — M19011 Primary osteoarthritis, right shoulder: Secondary | ICD-10-CM

## 2018-09-26 DIAGNOSIS — M19071 Primary osteoarthritis, right ankle and foot: Secondary | ICD-10-CM

## 2018-09-26 DIAGNOSIS — M19041 Primary osteoarthritis, right hand: Secondary | ICD-10-CM

## 2018-09-26 MED ORDER — TRAMADOL HCL 50 MG PO TABS: ORAL_TABLET | ORAL | 0 refills | Status: DC

## 2018-09-26 NOTE — Telephone Encounter (Signed)
Spoken and notified patient of Tawni Millers comments. Patient verbalized understanding. Patient is agreeable to the terms. No updates as of yet.

## 2018-09-26 NOTE — Telephone Encounter (Signed)
Pt left a message on triage line stating she saw her kidney specialist yesterday and he said she can take tramadol for pain. Asking for a rx.

## 2018-09-26 NOTE — Telephone Encounter (Signed)
Noted, rx sent to pharmacy.

## 2018-09-26 NOTE — Telephone Encounter (Signed)
Please notify her that the Tramadol is a controlled substance medication that I typically don't prescribe. I will make an exception for her as long as she can agree to use it sparingly only when needed, try not to use daily.  Is she agreeable?  Any other updates from the Kidney doctor?

## 2018-11-09 ENCOUNTER — Other Ambulatory Visit: Payer: Self-pay | Admitting: Primary Care

## 2018-11-09 DIAGNOSIS — E039 Hypothyroidism, unspecified: Secondary | ICD-10-CM

## 2018-11-11 ENCOUNTER — Telehealth: Payer: Self-pay | Admitting: Rheumatology

## 2018-11-11 NOTE — Telephone Encounter (Signed)
04/05/2018 ov note & 11/28/2017 labs faxed to Dr Joelyn Oms [phone], [facility]

## 2018-11-20 DIAGNOSIS — M1712 Unilateral primary osteoarthritis, left knee: Secondary | ICD-10-CM | POA: Diagnosis not present

## 2018-11-20 DIAGNOSIS — M1711 Unilateral primary osteoarthritis, right knee: Secondary | ICD-10-CM | POA: Diagnosis not present

## 2018-11-21 ENCOUNTER — Ambulatory Visit: Payer: PPO | Admitting: Podiatry

## 2018-12-12 ENCOUNTER — Other Ambulatory Visit: Payer: Self-pay

## 2018-12-12 DIAGNOSIS — M170 Bilateral primary osteoarthritis of knee: Secondary | ICD-10-CM

## 2018-12-12 DIAGNOSIS — M19011 Primary osteoarthritis, right shoulder: Secondary | ICD-10-CM

## 2018-12-12 DIAGNOSIS — M19041 Primary osteoarthritis, right hand: Secondary | ICD-10-CM

## 2018-12-12 DIAGNOSIS — M19071 Primary osteoarthritis, right ankle and foot: Secondary | ICD-10-CM

## 2018-12-12 NOTE — Telephone Encounter (Signed)
Pt saw ortho 3 wks ago and pt is needing knee replacement; pt said ortho wants PCP to prescribe tramadol taking one tab q4h but pt said she would only take bid. Pt request refill to [facility] [address]. Pt has 2 tramadol left. Please advise.

## 2018-12-12 NOTE — Telephone Encounter (Signed)
Ortho can prescribe Tramadol with their specific directions, especially since she will be undergoing surgery.

## 2018-12-13 ENCOUNTER — Other Ambulatory Visit: Payer: Self-pay

## 2018-12-13 DIAGNOSIS — M19012 Primary osteoarthritis, left shoulder: Secondary | ICD-10-CM

## 2018-12-13 DIAGNOSIS — M19071 Primary osteoarthritis, right ankle and foot: Secondary | ICD-10-CM

## 2018-12-13 DIAGNOSIS — M19041 Primary osteoarthritis, right hand: Secondary | ICD-10-CM

## 2018-12-13 DIAGNOSIS — M19042 Primary osteoarthritis, left hand: Secondary | ICD-10-CM

## 2018-12-13 DIAGNOSIS — M19011 Primary osteoarthritis, right shoulder: Secondary | ICD-10-CM

## 2018-12-13 DIAGNOSIS — M170 Bilateral primary osteoarthritis of knee: Secondary | ICD-10-CM

## 2018-12-13 MED ORDER — TRAMADOL HCL 50 MG PO TABS: ORAL_TABLET | ORAL | 0 refills | Status: DC

## 2018-12-13 NOTE — Telephone Encounter (Signed)
There is a MyChart message about this. I have called patient and she told me that she is pain and requesting a refill. She was told ask her primary care provider. I asked is there a reason why they won't do it, she stated that she is not sure

## 2018-12-13 NOTE — Telephone Encounter (Signed)
Spoke with patient via phone and notified her that she will need to get her Tramadol from orthopedics as they are working with her on her knee. She will call their office Monday. I will provide her with a short term supply until she can follow back up in the their office. Will write for once daily as needed.

## 2019-01-01 DIAGNOSIS — M170 Bilateral primary osteoarthritis of knee: Secondary | ICD-10-CM | POA: Diagnosis not present

## 2019-01-07 DIAGNOSIS — M25562 Pain in left knee: Secondary | ICD-10-CM | POA: Diagnosis not present

## 2019-01-07 DIAGNOSIS — R262 Difficulty in walking, not elsewhere classified: Secondary | ICD-10-CM | POA: Diagnosis not present

## 2019-01-09 DIAGNOSIS — R262 Difficulty in walking, not elsewhere classified: Secondary | ICD-10-CM | POA: Diagnosis not present

## 2019-01-09 DIAGNOSIS — M25562 Pain in left knee: Secondary | ICD-10-CM | POA: Diagnosis not present

## 2019-01-14 DIAGNOSIS — N183 Chronic kidney disease, stage 3 (moderate): Secondary | ICD-10-CM | POA: Diagnosis not present

## 2019-01-15 DIAGNOSIS — M25562 Pain in left knee: Secondary | ICD-10-CM | POA: Diagnosis not present

## 2019-01-15 DIAGNOSIS — R262 Difficulty in walking, not elsewhere classified: Secondary | ICD-10-CM | POA: Diagnosis not present

## 2019-01-16 DIAGNOSIS — M25511 Pain in right shoulder: Secondary | ICD-10-CM | POA: Diagnosis not present

## 2019-01-16 DIAGNOSIS — M19011 Primary osteoarthritis, right shoulder: Secondary | ICD-10-CM | POA: Diagnosis not present

## 2019-01-16 DIAGNOSIS — M25562 Pain in left knee: Secondary | ICD-10-CM | POA: Diagnosis not present

## 2019-01-16 DIAGNOSIS — M170 Bilateral primary osteoarthritis of knee: Secondary | ICD-10-CM | POA: Diagnosis not present

## 2019-01-16 DIAGNOSIS — M19012 Primary osteoarthritis, left shoulder: Secondary | ICD-10-CM | POA: Diagnosis not present

## 2019-01-16 DIAGNOSIS — M1712 Unilateral primary osteoarthritis, left knee: Secondary | ICD-10-CM | POA: Diagnosis not present

## 2019-01-16 DIAGNOSIS — M25512 Pain in left shoulder: Secondary | ICD-10-CM | POA: Diagnosis not present

## 2019-01-17 DIAGNOSIS — M25562 Pain in left knee: Secondary | ICD-10-CM | POA: Diagnosis not present

## 2019-01-17 DIAGNOSIS — R262 Difficulty in walking, not elsewhere classified: Secondary | ICD-10-CM | POA: Diagnosis not present

## 2019-01-21 DIAGNOSIS — R262 Difficulty in walking, not elsewhere classified: Secondary | ICD-10-CM | POA: Diagnosis not present

## 2019-01-21 DIAGNOSIS — M25562 Pain in left knee: Secondary | ICD-10-CM | POA: Diagnosis not present

## 2019-01-22 ENCOUNTER — Telehealth: Payer: Self-pay

## 2019-01-22 DIAGNOSIS — I129 Hypertensive chronic kidney disease with stage 1 through stage 4 chronic kidney disease, or unspecified chronic kidney disease: Secondary | ICD-10-CM | POA: Diagnosis not present

## 2019-01-22 DIAGNOSIS — N183 Chronic kidney disease, stage 3 (moderate): Secondary | ICD-10-CM | POA: Diagnosis not present

## 2019-01-22 NOTE — Telephone Encounter (Signed)
   [facility] Pre-operative Risk Assessment    Request for surgical clearance:  1. What type of surgery is being performed? LEFT TOTAL KNEE ARTHROPLASTY   2. When is this surgery scheduled? 02/11/19   3.  VWhat type of clearance is required (medical clearance vs. Pharmacy clearance to hold med vs. Both)? BOTH  4. Are there any medications that need to be held prior to surgery and how long? ASPIRIN   5. Practice name and name of physician performing surgery? [facility]; DR. Rodman Key OLIN   6. What is your office phone number [phone]   7.   What is your office fax number [phone] ATTN: SHERRY WILLIS  8.   Anesthesia type (None, local, MAC, general) ? SPINAL   Jacinta Shoe 01/22/2019, 3:08 PM  _________________________________________________________________   (provider comments below)

## 2019-01-22 NOTE — Telephone Encounter (Signed)
   Primary Cardiologist:Paula Harrington Challenger, MD  Chart reviewed as part of pre-operative protocol coverage. Because of Kathleen Paul past medical history and time since last visit, he/she will require a follow-up visit in order to better assess preoperative cardiovascular risk.  Pre-op covering staff: - Please schedule appointment and call patient to inform them. - Please contact requesting surgeon's office via preferred method (i.e, phone, fax) to inform them of need for appointment prior to surgery.  If applicable, this message will also be routed to pharmacy pool and/or primary cardiologist for input on holding anticoagulant/antiplatelet agent as requested below so that this information is available at time of patient's appointment.   Cecilie Kicks, NP  01/22/2019, 3:27 PM

## 2019-01-22 NOTE — Telephone Encounter (Signed)
Pt is scheduled to see Pecolia Ades, NP on 01/30/2019.

## 2019-01-23 ENCOUNTER — Telehealth: Payer: Self-pay

## 2019-01-23 NOTE — Telephone Encounter (Signed)
Pt left v/m wanting to know if Gentry Fitz NP received medical clearance form from [facility] and if so [facility] needs back asap.per pts chart pt has surgical clearance appt on 01/30/19 with cardiology. Pt request cb.

## 2019-01-24 ENCOUNTER — Other Ambulatory Visit: Payer: Self-pay | Admitting: Primary Care

## 2019-01-24 DIAGNOSIS — Z01818 Encounter for other preprocedural examination: Secondary | ICD-10-CM

## 2019-01-24 NOTE — Telephone Encounter (Signed)
Spoken and notified patient of Kathleen Paul comments. Patient stated that she was told that we need to complete the form and so does her cardiologist.

## 2019-01-24 NOTE — Telephone Encounter (Addendum)
Noted, please send copy of notes from March 2020, will also need labs completed so please schedule lab only appointment.

## 2019-01-24 NOTE — Telephone Encounter (Signed)
Message left for patient to return my call.  

## 2019-01-24 NOTE — Telephone Encounter (Addendum)
Please notify patient that I did receive surgical clearance form. If she has a cardiology appointment on 01/30/19 then they can complete the form. If the orthopedist is recommending for [place] to also complete the form then let me know. I did receive updated labs from her nephrologist which show improvement.

## 2019-01-27 NOTE — Telephone Encounter (Signed)
Spoken to patient and schedule lab appointment.

## 2019-01-28 ENCOUNTER — Other Ambulatory Visit: Payer: Self-pay

## 2019-01-28 ENCOUNTER — Other Ambulatory Visit (INDEPENDENT_AMBULATORY_CARE_PROVIDER_SITE_OTHER): Payer: PPO

## 2019-01-28 DIAGNOSIS — Z01818 Encounter for other preprocedural examination: Secondary | ICD-10-CM

## 2019-01-28 LAB — COMPREHENSIVE METABOLIC PANEL
ALT: 11 U/L (ref 0–35)
AST: 16 U/L (ref 0–37)
Albumin: 4 g/dL (ref 3.5–5.2)
Alkaline Phosphatase: 59 U/L (ref 39–117)
BUN: 30 mg/dL — ABNORMAL HIGH (ref 6–23)
CO2: 24 mEq/L (ref 19–32)
Calcium: 9 mg/dL (ref 8.4–10.5)
Chloride: 100 mEq/L (ref 96–112)
Creatinine, Ser: 1.22 mg/dL — ABNORMAL HIGH (ref 0.40–1.20)
GFR: 42.28 mL/min — ABNORMAL LOW (ref >60.00)
Glucose, Bld: 87 mg/dL (ref 70–99)
Potassium: 4.8 mEq/L (ref 3.5–5.1)
Sodium: 134 mEq/L — ABNORMAL LOW (ref 135–145)
Total Bilirubin: 0.7 mg/dL (ref 0.2–1.2)
Total Protein: 5.9 g/dL — ABNORMAL LOW (ref 6.0–8.3)

## 2019-01-28 LAB — CBC
HCT: 30.3 % — ABNORMAL LOW (ref 36.0–46.0)
Hemoglobin: 10.1 g/dL — ABNORMAL LOW (ref 12.0–15.0)
MCHC: 33.3 g/dL (ref 30.0–36.0)
MCV: 97.2 fl (ref 78.0–100.0)
Platelets: 223 10*3/uL (ref 150.0–400.0)
RBC: 3.12 Mil/uL — ABNORMAL LOW (ref 3.87–5.11)
RDW: 13.9 % (ref 11.5–15.5)
WBC: 5.5 10*3/uL (ref 4.0–10.5)

## 2019-01-28 LAB — PROTIME-INR
INR: 1 ratio (ref 0.8–1.0)
Prothrombin Time: 11.7 s (ref 9.6–13.1)

## 2019-01-28 LAB — HEMOGLOBIN A1C: Hgb A1c MFr Bld: 5.7 % (ref 4.6–6.5)

## 2019-01-29 ENCOUNTER — Encounter: Payer: Self-pay | Admitting: *Deleted

## 2019-01-29 LAB — POC URINALSYSI DIPSTICK (AUTOMATED)
Bilirubin, UA: NEGATIVE
Blood, UA: NEGATIVE
Glucose, UA: NEGATIVE
Ketones, UA: NEGATIVE
Leukocytes, UA: NEGATIVE
Nitrite, UA: NEGATIVE
Protein, UA: NEGATIVE
Spec Grav, UA: 1.015 (ref 1.010–1.025)
Urobilinogen, UA: 0.2 E.U./dL
pH, UA: 7 (ref 5.0–8.0)

## 2019-01-29 NOTE — Progress Notes (Signed)
Cardiology Office Note:    Date:  01/30/2019   ID:  Kathleen Paul, DOB 1936-06-25, MRN [id number]  PCP:  [name], NP  Cardiologist:  Dorris Carnes, MD  Referring MD: [name], NP   Chief Complaint  Patient presents with  . Pre-op Exam    History of Present Illness:    Kathleen Paul is a 83 y.o. female with a past medical history significant for mild CAD by cath in 2011 (50% mid LAD), hypertension, palpitations, fatigue, low blood pressure, lower extremity edema, rheumatoid arthritis, GERD, Schatzki's ring, and hiatal hernia.  She has worn an event monitor that showed rare PACs. She quit smoking at age 81.   She was last seen in the office on 01/04/2018 by Dr. Harrington Challenger at which time she was doing well.   Kathleen Paul is being seen today for annual follow-up and surgical clearance for left total knee arthroplasty to be done on 02/11/2019.  She was seen in the emergency department in 03/2018 for esophageal obstruction and underwent urgent upper endoscopy for removal of food impaction.  She was noted to have a short area of esophageal stenosis as well as chronic inflammation and focal edema in the stomach.  She was started on omeprazole with improvement per gastroenterology notes.  Today she is here with her daughter. She is no longer having palpitations. She has not been having any chest pain/pressure, shortenss of breath, orthopnea, PND, edema, lightheadedness or syncope.   She has lost strength that she relates to not being able to walk much due to her knee pain. She has been getting PT for the last month with some improvement.   She is followed by Dr Joni Fears for nephrology.  Home BPs 120's/70's. HR 50's  Past Medical History:  Diagnosis Date  . Anxiety state, unspecified   . Arthritis   . Atherosclerosis   . Diverticulosis   . Esophageal stricture   . Essential hypertension, benign   . Gastric ulcer   . Hiatal hernia   . History of DVT (deep vein thrombosis)     legs  . IDA (iron deficiency anemia)   . Inguinal hernia without mention of obstruction or gangrene, unilateral or unspecified, (not specified as recurrent)    2014  . Internal hemorrhoids   . Mild mitral regurgitation    a. 04/2010 Echo: nl EF, mild diast dysfxn, trace PR, mild TR, mild-mod MR.  . Non-obstructive CAD    a. 04/2010 Myoview: EF 77%, fixed apical/inferior defect;  b. 04/2010 Cath: LAD 60m, LCX/RCA minor irregs, EF 60%;  c. 04/2011 Myoview: low risk.  . Pure hypercholesterolemia   . Scoliosis   . UI (urinary incontinence)   . Umbilical hernia without mention of obstruction or gangrene    2014  . Unspecified hypothyroidism   . Vitamin D deficiency     Past Surgical History:  Procedure Laterality Date  . ABDOMINAL HYSTERECTOMY  2008  . BIOPSY  04/02/2018   Procedure: BIOPSY;  Surgeon: Rush Landmark Telford Nab., MD;  Location: [facility];  Service: Gastroenterology;;  . CARDIAC CATHETERIZATION  2011   [facility]  . COLONOSCOPY  2003   [facility]  . ESOPHAGOGASTRODUODENOSCOPY (EGD) WITH PROPOFOL N/A 04/02/2018   Procedure: ESOPHAGOGASTRODUODENOSCOPY (EGD) WITH PROPOFOL;  Surgeon: Rush Landmark Telford Nab., MD;  Location: [facility];  Service: Gastroenterology;  Laterality: N/A;  . FOREIGN BODY REMOVAL  04/02/2018   Procedure: FOREIGN BODY REMOVAL;  Surgeon: Rush Landmark Telford Nab., MD;  Location: [facility];  Service:  Gastroenterology;;  . HERNIA REPAIR Right 09/23/2012   repair [facility]  . LAPAROSCOPIC HYSTERECTOMY    . TONSILLECTOMY      Current Medications: Current Meds  Medication Sig  . aspirin EC 81 MG tablet Take by mouth.  . Biotin 1000 MCG tablet Take 1,000 mcg by mouth daily.  . Calcium Carbonate-Vitamin D 600-200 MG-UNIT TABS Take by mouth.  . enalapril-hydrochlorothiazide (VASERETIC) 10-25 MG tablet Take 1 tablet by mouth daily.  . metoprolol tartrate (LOPRESSOR) 25 MG tablet TAKE 1 TABLET BY MOUTH EVERY EVENING  . Multiple Vitamin (ONE-A-DAY ESSENTIAL)  TABS Take by mouth.  Marland Kitchen omeprazole (PRILOSEC) 40 MG capsule Take 1 capsule (40 mg total) by mouth 2 (two) times daily before a meal.  . OVER THE COUNTER MEDICATION Apply 1 application topically daily as needed (pain). OxyRub Cream  . simvastatin (ZOCOR) 20 MG tablet Take 1 tablet by mouth once daily for cholesterol.  . traMADol (ULTRAM) 50 MG tablet Take 1 tablet by mouth once daily as needed for severe pain.     Allergies:   Codeine   Social History   Socioeconomic History  . Marital status: Widowed    Spouse name: Not on file  . Number of children: Not on file  . Years of education: Not on file  . Highest education level: Not on file  Occupational History  . Not on file  Social Needs  . Financial resource strain: Not on file  . Food insecurity    Worry: Not on file    Inability: Not on file  . Transportation needs    Medical: Not on file    Non-medical: Not on file  Tobacco Use  . Smoking status: Former Smoker    Packs/day: 0.50    Years: 30.00    Pack years: 15.00    Types: Cigarettes    Quit date: 07/10/1988    Years since quitting: 30.5  . Smokeless tobacco: Never Used  Substance and Sexual Activity  . Alcohol use: No  . Drug use: Never  . Sexual activity: Not on file  Lifestyle  . Physical activity    Days per week: Not on file    Minutes per session: Not on file  . Stress: Not on file  Relationships  . Social Herbalist on phone: Not on file    Gets together: Not on file    Attends religious service: Not on file    Active member of club or organization: Not on file    Attends meetings of clubs or organizations: Not on file    Relationship status: Not on file  Other Topics Concern  . Not on file  Social History Narrative  . Not on file     Family History: The patient's family history includes Breast cancer in her maternal aunt and sister; Heart attack in her father and mother; Heart disease in her brother, father, and mother. ROS:   Please  see the history of present illness.     All other systems reviewed and are negative.  EKGs/Labs/Other Studies Reviewed:    The following studies were reviewed today: No recent studies  EKG:  EKG is ordered today.  The ekg ordered today demonstrates sinus bradycardia, 57 bpm, right bundle branch block, previously present  Recent Labs: 08/05/2018: TSH 2.79 01/28/2019: ALT 11; BUN 30; Creatinine, Ser 1.22; Hemoglobin 10.1; Platelets 223.0; Potassium 4.8; Sodium 134   Recent Lipid Panel    Component Value Date/Time   CHOL 131  08/05/2018 0828   TRIG 69.0 08/05/2018 0828   HDL 66.60 08/05/2018 0828   CHOLHDL 2 08/05/2018 0828   VLDL 13.8 08/05/2018 0828   LDLCALC 51 08/05/2018 0828   LDLDIRECT 180.2 07/17/2013 0828    Physical Exam:    VS:  BP 122/72   Pulse 66   Ht 5' (1.524 m)   Wt 142 lb 12.8 oz (64.8 kg)   SpO2 97%   BMI 27.89 kg/m     Wt Readings from Last 3 Encounters:  01/30/19 142 lb 12.8 oz (64.8 kg)  09/13/18 150 lb 12 oz (68.4 kg)  05/21/18 151 lb 2 oz (68.5 kg)     Physical Exam  Constitutional: She is oriented to person, place, and time. She appears well-developed and well-nourished. No distress.  HENT:  Head: Normocephalic and atraumatic.  Neck: Normal range of motion. Neck supple. No JVD present.  Cardiovascular: Normal rate, regular rhythm, normal heart sounds and intact distal pulses. Exam reveals no gallop and no friction rub.  No murmur heard. Pulmonary/Chest: Effort normal and breath sounds normal. No respiratory distress. She has no wheezes. She has no rales.  Abdominal: Soft. Bowel sounds are normal.  Musculoskeletal: Normal range of motion.        General: No edema.  Neurological: She is alert and oriented to person, place, and time.  Skin: Skin is warm and dry.  Psychiatric: She has a normal mood and affect. Her behavior is normal. Judgment and thought content normal.  Vitals reviewed.   ASSESSMENT:    1. Palpitations   2. Essential  hypertension   3. Coronary artery disease involving native coronary artery of native heart without angina pectoris   4. Hyperlipidemia, unspecified hyperlipidemia type   5. Preop cardiovascular exam    PLAN:    In order of problems listed above:  Palpitations: Well controlled on beta blocker. Currently asymptomatic.   Hypertension: On metoprolol tartrate 25 mg, enalapril 20 mg. BP well Controlled.   CAD: Mild by cath in 2011.  On aspirin and statin. No anginal symptoms.   Hyperlipidemia: On simvastatin 20 mg. LDL 51 in 07/2018. Good control. Continue current therapy.   Rheumatoid arthritis: Diagnosed last year and placed on Plaquenil which has been stopped it due to adverse reaction.   Preoperative clearance for knee replacement:  Based on ACC/AHA guidelines, KAILEAH SHEVCHENKO would be at acceptable risk for the planned procedure without further cardiovascular testing.  It is OK to hold aspirin as needed for surgery.   I will route this recommendation to the requesting party via Epic fax function.   Medication Adjustments/Labs and Tests Ordered: Current medicines are reviewed at length with the patient today.  Concerns regarding medicines are outlined above. Labs and tests ordered and medication changes are outlined in the patient instructions below:  Patient Instructions  Medication Instructions:  Your physician recommends that you continue on your current medications as directed. Please refer to the Current Medication list given to you today.   If you need a refill on your cardiac medications before your next appointment, please call your pharmacy.   Lab work: None   If you have labs (blood work) drawn today and your tests are completely normal, you will receive your results only by: Marland Kitchen MyChart Message (if you have MyChart) OR . A paper copy in the mail If you have any lab test that is abnormal or we need to change your treatment, we will call you to review the results.  Testing/Procedures: None  Follow-Up: At [facility], you and your health needs are our priority.  As part of our continuing mission to provide you with exceptional heart care, we have created designated Provider Care Teams.  These Care Teams include your primary Cardiologist (physician) and Advanced Practice Providers (APPs -  Physician Assistants and Nurse Practitioners) who all work together to provide you with the care you need, when you need it. You will need a follow up appointment in:  6 months.  Please call our office 2 months in advance to schedule this appointment.  You may see Dorris Carnes, MD or one of the following Advanced Practice Providers on your designated Care Team: Richardson Dopp, PA-C [place], [place] . Daune Perch, NP  Any Other Special Instructions Will Be Listed Below (If Applicable).              Pecolia Ades, NP     Signed, Daune Perch, NP  01/30/2019 1:30 PM    [place]

## 2019-01-29 NOTE — Addendum Note (Signed)
Addended by: Jacqualin Combes on: 01/29/2019 10:13 AM   Modules accepted: Orders

## 2019-01-30 ENCOUNTER — Encounter: Payer: Self-pay | Admitting: Cardiology

## 2019-01-30 ENCOUNTER — Ambulatory Visit (INDEPENDENT_AMBULATORY_CARE_PROVIDER_SITE_OTHER): Payer: PPO | Admitting: Cardiology

## 2019-01-30 ENCOUNTER — Other Ambulatory Visit: Payer: Self-pay

## 2019-01-30 VITALS — BP 122/72 | HR 66 | Ht 60.0 in | Wt 142.8 lb

## 2019-01-30 DIAGNOSIS — E785 Hyperlipidemia, unspecified: Secondary | ICD-10-CM

## 2019-01-30 DIAGNOSIS — I2510 Atherosclerotic heart disease of native coronary artery without angina pectoris: Secondary | ICD-10-CM

## 2019-01-30 DIAGNOSIS — Z01810 Encounter for preprocedural cardiovascular examination: Secondary | ICD-10-CM

## 2019-01-30 DIAGNOSIS — I10 Essential (primary) hypertension: Secondary | ICD-10-CM | POA: Diagnosis not present

## 2019-01-30 DIAGNOSIS — R002 Palpitations: Secondary | ICD-10-CM | POA: Diagnosis not present

## 2019-01-30 NOTE — Patient Instructions (Signed)
Medication Instructions:  Your physician recommends that you continue on your current medications as directed. Please refer to the Current Medication list given to you today.   If you need a refill on your cardiac medications before your next appointment, please call your pharmacy.   Lab work: None   If you have labs (blood work) drawn today and your tests are completely normal, you will receive your results only by: Marland Kitchen MyChart Message (if you have MyChart) OR . A paper copy in the mail If you have any lab test that is abnormal or we need to change your treatment, we will call you to review the results.  Testing/Procedures: None  Follow-Up: At [facility], you and your health needs are our priority.  As part of our continuing mission to provide you with exceptional heart care, we have created designated Provider Care Teams.  These Care Teams include your primary Cardiologist (physician) and Advanced Practice Providers (APPs -  Physician Assistants and Nurse Practitioners) who all work together to provide you with the care you need, when you need it. You will need a follow up appointment in:  6 months.  Please call our office 2 months in advance to schedule this appointment.  You may see Dorris Carnes, MD or one of the following Advanced Practice Providers on your designated Care Team: Richardson Dopp, PA-C [place], [place] . Daune Perch, NP  Any Other Special Instructions Will Be Listed Below (If Applicable).              Pecolia Ades, NP

## 2019-01-30 NOTE — H&P (Signed)
TOTAL KNEE ADMISSION H&P  Patient is being admitted for left total knee arthroplasty.  Subjective:  Chief Complaint:   Left knee primary OA / pain  HPI: Kathleen Paul, 83 y.o. female, has a history of pain and functional disability in the left knee due to arthritis and has failed non-surgical conservative treatments for greater than 12 weeks to include NSAID's and/or analgesics, corticosteriod injections, viscosupplementation injections, use of assistive devices and activity modification.  Onset of symptoms was gradual, starting >10 years ago with gradually worsening course since that time. The patient noted no past surgery on the left knee(s).  Patient currently rates pain in the left knee(s) at 10 out of 10 with activity. Patient has night pain, worsening of pain with activity and weight bearing, pain that interferes with activities of daily living, pain with passive range of motion, crepitus and joint swelling.  Patient has evidence of periarticular osteophytes and joint space narrowing by imaging studies.  There is no active infection.  Risks, benefits and expectations were discussed with the patient.  Risks including but not limited to the risk of anesthesia, blood clots, nerve damage, blood vessel damage, failure of the prosthesis, infection and up to and including death.  Patient understand the risks, benefits and expectations and wishes to proceed with surgery.   PCP: [name], NP  D/C Plans:       Home   Post-op Meds:       No Rx given   Tranexamic Acid:      To be given - IV   Decadron:      Is to be given  FYI:     Xarelto then ASA (previous superficial clots)  Norco  No Celebrex (CKD)  DME:    Rx given for - 3-n-1  PT:   [place]: [place], [place]    Patient Active Problem List   Diagnosis Date Noted  . Food impaction of esophagus 04/02/2018  . Primary osteoarthritis of both hands 12/24/2017  . Primary osteoarthritis of  both knees 12/24/2017  . Primary osteoarthritis of both feet 12/24/2017  . Osteoarthritis of both shoulders 12/24/2017  . Gastroesophageal reflux disease 08/30/2017  . GAD (generalized anxiety disorder) 09/05/2016  . Renal insufficiency 07/17/2013  . Suprapubic mass 08/20/2012  . Hypothyroidism   . UI (urinary incontinence)   . Stricture and stenosis of esophagus 04/11/2011  . Chest pain 03/29/2011  . Dyslipidemia 03/29/2011  . Hypertension 03/29/2011  . CAD (coronary artery disease) 03/29/2011  . Palpitations 03/29/2011   Past Medical History:  Diagnosis Date  . Anxiety state, unspecified   . Arthritis   . Atherosclerosis   . Diverticulosis   . Esophageal stricture   . Essential hypertension, benign   . Gastric ulcer   . Hiatal hernia   . History of DVT (deep vein thrombosis)    legs  . IDA (iron deficiency anemia)   . Inguinal hernia without mention of obstruction or gangrene, unilateral or unspecified, (not specified as recurrent)    2014  . Internal hemorrhoids   . Mild mitral regurgitation    a. 04/2010 Echo: nl EF, mild diast dysfxn, trace PR, mild TR, mild-mod MR.  . Non-obstructive CAD    a. 04/2010 Myoview: EF 77%, fixed apical/inferior defect;  b. 04/2010 Cath: LAD 21m, LCX/RCA minor irregs, EF 60%;  c. 04/2011 Myoview: low risk.  . Pure hypercholesterolemia   . Scoliosis   . UI (urinary incontinence)   .  Umbilical hernia without mention of obstruction or gangrene    2014  . Unspecified hypothyroidism   . Vitamin D deficiency     Past Surgical History:  Procedure Laterality Date  . ABDOMINAL HYSTERECTOMY  2008  . BIOPSY  04/02/2018   Procedure: BIOPSY;  Surgeon: Rush Landmark Telford Nab., MD;  Location: [facility];  Service: Gastroenterology;;  . CARDIAC CATHETERIZATION  2011   [facility]  . COLONOSCOPY  2003   [facility]  . ESOPHAGOGASTRODUODENOSCOPY (EGD) WITH PROPOFOL N/A 04/02/2018   Procedure: ESOPHAGOGASTRODUODENOSCOPY (EGD) WITH PROPOFOL;   Surgeon: Rush Landmark Telford Nab., MD;  Location: [facility];  Service: Gastroenterology;  Laterality: N/A;  . FOREIGN BODY REMOVAL  04/02/2018   Procedure: FOREIGN BODY REMOVAL;  Surgeon: Rush Landmark Telford Nab., MD;  Location: [facility];  Service: Gastroenterology;;  . HERNIA REPAIR Right 09/23/2012   repair R[facility]  . LAPAROSCOPIC HYSTERECTOMY    . TONSILLECTOMY      No current facility-administered medications for this encounter.    Current Outpatient Medications  Medication Sig Dispense Refill Last Dose  . aspirin EC 81 MG tablet Take by mouth.   Taking  . Biotin 1000 MCG tablet Take 1,000 mcg by mouth daily.   Taking  . Calcium Carbonate-Vitamin D 600-200 MG-UNIT TABS Take by mouth.   Taking  . enalapril-hydrochlorothiazide (VASERETIC) 10-25 MG tablet Take 1 tablet by mouth daily.   Taking  . metoprolol tartrate (LOPRESSOR) 25 MG tablet TAKE 1 TABLET BY MOUTH EVERY EVENING 90 tablet 3 Taking  . Multiple Vitamin (ONE-A-DAY ESSENTIAL) TABS Take by mouth.   Taking  . omeprazole (PRILOSEC) 40 MG capsule Take 1 capsule (40 mg total) by mouth 2 (two) times daily before a meal. 180 capsule 1 Taking  . OVER THE COUNTER MEDICATION Apply 1 application topically daily as needed (pain). OxyRub Cream   Taking  . simvastatin (ZOCOR) 20 MG tablet Take 1 tablet by mouth once daily for cholesterol. 90 tablet 3 Taking  . traMADol (ULTRAM) 50 MG tablet Take 1 tablet by mouth once daily as needed for severe pain. 30 tablet 0 Taking   Allergies  Allergen Reactions  . Codeine Nausea Only    Social History   Tobacco Use  . Smoking status: Former Smoker    Packs/day: 0.50    Years: 30.00    Pack years: 15.00    Types: Cigarettes    Quit date: 07/10/1988    Years since quitting: 30.5  . Smokeless tobacco: Never Used  Substance Use Topics  . Alcohol use: No    Family History  Problem Relation Age of Onset  . Heart disease Mother   . Heart attack Mother   . Heart disease Father   . Heart attack  Father   . Breast cancer Sister   . Breast cancer Maternal Aunt   . Heart disease Brother        sister     Review of Systems  Constitutional: Negative.   HENT: Negative.   Eyes: Negative.   Respiratory: Negative.   Cardiovascular: Negative.   Gastrointestinal: Positive for heartburn.  Genitourinary: Negative.   Musculoskeletal: Positive for joint pain.  Skin: Negative.   Neurological: Negative.   Endo/Heme/Allergies: Negative.   Psychiatric/Behavioral: The patient is nervous/anxious.     Objective:  Physical Exam  Constitutional: She is oriented to person, place, and time. She appears well-developed.  HENT:  Head: Normocephalic.  Eyes: Pupils are equal, round, and reactive to light.  Neck: Neck supple. No JVD present. No  tracheal deviation present. No thyromegaly present.  Cardiovascular: Normal rate, regular rhythm and intact distal pulses.  Respiratory: Effort normal and breath sounds normal. No respiratory distress. She has no wheezes.  GI: Soft. There is no abdominal tenderness. There is no guarding.  Musculoskeletal:     Left knee: She exhibits swelling and bony tenderness. She exhibits no ecchymosis, no deformity and no laceration. Tenderness found.  Lymphadenopathy:    She has no cervical adenopathy.  Neurological: She is alert and oriented to person, place, and time.  Skin: Skin is warm and dry.  Psychiatric: She has a normal mood and affect.    Vital signs in last 24 hours: Pulse Rate:  [66] 66 (07/23 1004) BP: (122)/(72) 122/72 (07/23 1004) SpO2:  [97 %] 97 % (07/23 1004) Weight:  [64.8 kg] 64.8 kg (07/23 1004)  Labs:   Estimated body mass index is 27.89 kg/m as calculated from the following:   Height as of 01/30/19: 5' (1.524 m).   Weight as of 01/30/19: 64.8 kg.   Imaging Review Plain radiographs demonstrate severe degenerative joint disease of the left knee(s). The overall alignment is significant valgus. The bone quality appears to be good for  age and reported activity level.      Assessment/Plan:  End stage arthritis, left knee   The patient history, physical examination, clinical judgment of the provider and imaging studies are consistent with end stage degenerative joint disease of the left knee(s) and total knee arthroplasty is deemed medically necessary. The treatment options including medical management, injection therapy arthroscopy and arthroplasty were discussed at length. The risks and benefits of total knee arthroplasty were presented and reviewed. The risks due to aseptic loosening, infection, stiffness, patella tracking problems, thromboembolic complications and other imponderables were discussed. The patient acknowledged the explanation, agreed to proceed with the plan and consent was signed. Patient is being admitted for inpatient treatment for surgery, pain control, PT, OT, prophylactic antibiotics, VTE prophylaxis, progressive ambulation and ADL's and discharge planning. The patient is planning to be discharged home.     Patient's anticipated LOS is less than 2 midnights, meeting these requirements: - Lives within 1 hour of care - Has a competent adult at home to recover with post-op recover - NO history of  - Chronic pain requiring opiods  - Diabetes  - Heart failure  - Heart attack  - Stroke  - DVT/VTE  - Cardiac arrhythmia  - Respiratory Failure/COPD  - Anemia  - Advanced Liver disease      [place] Pugh. Kiran Lapine   PA-C  01/30/2019, 10:48 AM

## 2019-01-31 DIAGNOSIS — R262 Difficulty in walking, not elsewhere classified: Secondary | ICD-10-CM | POA: Diagnosis not present

## 2019-01-31 DIAGNOSIS — M25562 Pain in left knee: Secondary | ICD-10-CM | POA: Diagnosis not present

## 2019-02-01 DIAGNOSIS — N390 Urinary tract infection, site not specified: Secondary | ICD-10-CM | POA: Diagnosis not present

## 2019-02-05 ENCOUNTER — Other Ambulatory Visit: Payer: Self-pay

## 2019-02-05 DIAGNOSIS — M1712 Unilateral primary osteoarthritis, left knee: Secondary | ICD-10-CM | POA: Diagnosis not present

## 2019-02-06 NOTE — Patient Instructions (Addendum)
YOU NEED TO HAVE A COVID 19 TEST ON_7/31______ @__12 :00_____, THIS TEST MUST BE DONE BEFORE SURGERY, COME  [place], [place] , [address]. ONCE YOUR COVID TEST IS COMPLETED, PLEASE BEGIN THE QUARANTINE INSTRUCTIONS AS OUTLINED IN YOUR HANDOUT.                Kathleen Paul   Your procedure is scheduled on: Tuesday 02/11/19   Report to [facility] Main  Entrance Report to admitting at   9:25 AM   1 VISITOR IS ALLOWED TO WAIT IN WAITING ROOM  ONLY DAY OF YOUR SURGERY.  Not in Short Stay   Call this number if you have problems the morning of surgery [phone]   . BRUSH YOUR TEETH MORNING OF SURGERY AND RINSE YOUR MOUTH OUT, NO CHEWING GUM CANDY OR MINTS.   Do not eat food After Midnight . YOU MAY HAVE CLEAR LIQUIDS FROM MIDNIGHT UNTIL 9:00 AM.  At 9:00 AM Please finish the prescribed Pre-Surgery  Drink . Nothing by mouth after you finish the  drink !     CLEAR LIQUID DIET   Foods Allowed                                                                     Foods Excluded  Coffee and tea, regular and decaf                             liquids that you cannot  Plain Jell-O any favor except red or purple                                           see through such as: Fruit ices (not with fruit pulp)                                     milk, soups, orange juice  Iced Popsicles                                    All solid food Carbonated beverages, regular and diet                                    Cranberry, grape and apple juices Sports drinks like Gatorade Lightly seasoned clear broth or consume(fat free) Sugar, honey syrup  [place] - Preparing for Surgery Before surgery, you can play an important role.  Because skin is not sterile, your skin needs to be as free of germs as possible.  You can reduce the number of germs on your skin by washing with CHG (chlorahexidine gluconate) soap before surgery.  CHG is an antiseptic cleaner which kills germs and bonds  with the skin to continue killing germs even after washing. Please DO NOT use if you have an allergy to CHG or antibacterial soaps.  If your skin becomes reddened/irritated  stop using the CHG and inform your nurse when you arrive at Short Stay. Do not shave (including legs and underarms) for at least 48 hours prior to the first CHG shower.  You may shave your face/neck. Please follow these instructions carefully:  1.  Shower with CHG Soap the night before surgery and the  morning of Surgery.  2.  If you choose to wash your hair, wash your hair first as usual with your  normal  shampoo.  3.  After you shampoo, rinse your hair and body thoroughly to remove the  shampoo.                           4             .  Use CHG as you would any other liquid soap.  You can apply chg directly  to the skin and wash                       Gently with a scrungie or clean washcloth.  5.  Apply the CHG Soap to your body ONLY FROM THE NECK DOWN.   Do not use on face/ open                           Wound or open sores. Avoid contact with eyes, ears mouth and genitals (private parts).                       Wash face,  Genitals (private parts) with your normal soap.             6.  Wash thoroughly, paying special attention to the area where your surgery  will be performed.  7.  Thoroughly rinse your body with warm water from the neck down.  8.  DO NOT shower/wash with your normal soap after using and rinsing off  the CHG Soap.                9.  Pat yourself dry with a clean towel.            10.  Wear clean pajamas.            11.  Place clean sheets on your bed the night of your first shower and do not  sleep with pets. Day of Surgery : Do not apply any lotions/deodorants the morning of surgery.  Please wear clean clothes to the hospital/surgery center.  FAILURE TO FOLLOW THESE INSTRUCTIONS MAY RESULT IN THE CANCELLATION OF YOUR SURGERY PATIENT SIGNATURE_________________________________  NURSE  SIGNATURE__________________________________  ________________________________________________________________________          [name]  An incentive spirometer is a tool that can help keep your lungs clear and active. This tool measures how well you are filling your lungs with each breath. Taking long deep breaths may help reverse or decrease the chance of developing breathing (pulmonary) problems (especially infection) following:  A long period of time when you are unable to move or be active. BEFORE THE PROCEDURE   If the spirometer includes an indicator to show your best effort, your nurse or respiratory therapist will set it to a desired goal.  If possible, sit up straight or lean slightly forward. Try not to slouch.  Hold the incentive spirometer in an upright position. INSTRUCTIONS FOR USE  1. Sit on the edge of your bed  if possible, or sit up as far as you can in bed or on a chair. 2. Hold the incentive spirometer in an upright position. 3. Breathe out normally. 4. Place the mouthpiece in your mouth and seal your lips tightly around it. 5. Breathe in slowly and as deeply as possible, raising the piston or the ball toward the top of the column. 6. Hold your breath for 3-5 seconds or for as long as possible. Allow the piston or ball to fall to the bottom of the column. 7. Remove the mouthpiece from your mouth and breathe out normally. 8. Rest for a few seconds and repeat Steps 1 through 7 at least 10 times every 1-2 hours when you are awake. Take your time and take a few normal breaths between deep breaths. 9. The spirometer may include an indicator to show your best effort. Use the indicator as a goal to work toward during each repetition. 10. After each set of 10 deep breaths, practice coughing to be sure your lungs are clear. If you have an incision (the cut made at the time of surgery), support your incision when coughing by placing a pillow or rolled up towels firmly  against it. Once you are able to get out of bed, walk around indoors and cough well. You may stop using the incentive spirometer when instructed by your caregiver.  RISKS AND COMPLICATIONS  Take your time so you do not get dizzy or light-headed.  If you are in pain, you may need to take or ask for pain medication before doing incentive spirometry. It is harder to take a deep breath if you are having pain. AFTER USE  Rest and breathe slowly and easily.  It can be helpful to keep track of a log of your progress. Your caregiver can provide you with a simple table to help with this. If you are using the spirometer at home, follow these instructions: [place] IF:   You are having difficultly using the spirometer.  You have trouble using the spirometer as often as instructed.  Your pain medication is not giving enough relief while using the spirometer.  You develop fever of 100.5 F (38.1 C) or higher. SEEK IMMEDIATE MEDICAL CARE IF:   You cough up bloody sputum that had not been present before.  You develop fever of 102 F (38.9 C) or greater.  You develop worsening pain at or near the incision site. MAKE SURE YOU:   Understand these instructions.  Will watch your condition.  Will get help right away if you are not doing well or get worse. Document Released: 11/06/2006 Document Revised: 09/18/2011 Document Reviewed: 01/07/2007 ExitCare Patient Information 2014 ExitCare, [place].   ________________________________________________________________________  WHAT IS A BLOOD TRANSFUSION? Blood Transfusion Information  A transfusion is the replacement of blood or some of its parts. Blood is made up of multiple cells which provide different functions.  Red blood cells carry oxygen and are used for blood loss replacement.  White blood cells fight against infection.  Platelets control bleeding.  Plasma helps clot blood.  Other blood products are available for  specialized needs, such as hemophilia or other clotting disorders. BEFORE THE TRANSFUSION  Who gives blood for transfusions?   Healthy volunteers who are fully evaluated to make sure their blood is safe. This is blood bank blood. Transfusion therapy is the safest it has ever been in the practice of medicine. Before blood is taken from a donor, a complete history is taken to make  sure that person has no history of diseases nor engages in risky social behavior (examples are intravenous drug use or sexual activity with multiple partners). The donor's travel history is screened to minimize risk of transmitting infections, such as malaria. The donated blood is tested for signs of infectious diseases, such as HIV and hepatitis. The blood is then tested to be sure it is compatible with you in order to minimize the chance of a transfusion reaction. If you or a relative donates blood, this is often done in anticipation of surgery and is not appropriate for emergency situations. It takes many days to process the donated blood. RISKS AND COMPLICATIONS Although transfusion therapy is very safe and saves many lives, the main dangers of transfusion include:   Getting an infectious disease.  Developing a transfusion reaction. This is an allergic reaction to something in the blood you were given. Every precaution is taken to prevent this. The decision to have a blood transfusion has been considered carefully by your caregiver before blood is given. Blood is not given unless the benefits outweigh the risks. AFTER THE TRANSFUSION  Right after receiving a blood transfusion, you will usually feel much better and more energetic. This is especially true if your red blood cells have gotten low (anemic). The transfusion raises the level of the red blood cells which carry oxygen, and this usually causes an energy increase.  The nurse administering the transfusion will monitor you carefully for complications. HOME CARE  INSTRUCTIONS  No special instructions are needed after a transfusion. You may find your energy is better. Speak with your caregiver about any limitations on activity for underlying diseases you may have. SEEK MEDICAL CARE IF:   Your condition is not improving after your transfusion.  You develop redness or irritation at the intravenous (IV) site. SEEK IMMEDIATE MEDICAL CARE IF:  Any of the following symptoms occur over the next 12 hours:  Shaking chills.  You have a temperature by mouth above 102 F (38.9 C), not controlled by medicine.  Chest, back, or muscle pain.  People around you feel you are not acting correctly or are confused.  Shortness of breath or difficulty breathing.  Dizziness and fainting.  You get a rash or develop hives.  You have a decrease in urine output.  Your urine turns a dark color or changes to pink, red, or brown. Any of the following symptoms occur over the next 10 days:  You have a temperature by mouth above 102 F (38.9 C), not controlled by medicine.  Shortness of breath.  Weakness after normal activity.  The white part of the eye turns yellow (jaundice).  You have a decrease in the amount of urine or are urinating less often.  Your urine turns a dark color or changes to pink, red, or brown. Document Released: 06/23/2000 Document Revised: 09/18/2011 Document Reviewed: 02/10/2008 [facility] Patient Information 2014 [place].   Take these medicines the morning of surgery with A SIP OF WATER: Levothyroxine, Prilosec                                You may not have any metal on your body including hair pins and              piercings              Do not wear jewelry, make-up, lotions, powders or perfumes, deodorant  Do not wear nail polish.  Do not shave  48 hours prior to surgery.              Do not bring valuables to the hospital. [place].  Contacts, dentures or  bridgework may not be worn into surgery.        Special Instructions: N/A              Please read over the following fact sheets you were given:

## 2019-02-07 ENCOUNTER — Encounter (HOSPITAL_COMMUNITY): Payer: Self-pay

## 2019-02-07 ENCOUNTER — Other Ambulatory Visit: Payer: Self-pay

## 2019-02-07 ENCOUNTER — Encounter (HOSPITAL_COMMUNITY)
Admission: RE | Admit: 2019-02-07 | Discharge: 2019-02-07 | Disposition: A | Discharge status: Home / Self Care | Payer: PPO | Source: Ambulatory Visit | Attending: Orthopedic Surgery | Admitting: Orthopedic Surgery

## 2019-02-07 ENCOUNTER — Other Ambulatory Visit (HOSPITAL_COMMUNITY)
Admission: RE | Admit: 2019-02-07 | Discharge: 2019-02-07 | Disposition: A | Discharge status: Home / Self Care | Payer: PPO | Source: Ambulatory Visit | Attending: Orthopedic Surgery | Admitting: Orthopedic Surgery

## 2019-02-07 DIAGNOSIS — Z20828 Contact with and (suspected) exposure to other viral communicable diseases: Secondary | ICD-10-CM | POA: Insufficient documentation

## 2019-02-07 DIAGNOSIS — Z01812 Encounter for preprocedural laboratory examination: Secondary | ICD-10-CM | POA: Diagnosis not present

## 2019-02-07 LAB — CBC
HCT: 32.4 % — ABNORMAL LOW (ref 36.0–46.0)
Hemoglobin: 10.5 g/dL — ABNORMAL LOW (ref 12.0–15.0)
MCH: 31.3 pg (ref 26.0–34.0)
MCHC: 32.4 g/dL (ref 30.0–36.0)
MCV: 96.4 fL (ref 80.0–100.0)
Platelets: 239 10*3/uL (ref 150–400)
RBC: 3.36 MIL/uL — ABNORMAL LOW (ref 3.87–5.11)
RDW: 13.3 % (ref 11.5–15.5)
WBC: 5.1 10*3/uL (ref 4.0–10.5)
nRBC: 0 % (ref 0.0–0.2)

## 2019-02-07 LAB — BASIC METABOLIC PANEL
Anion gap: 11 (ref 5–15)
BUN: 24 mg/dL — ABNORMAL HIGH (ref 8–23)
CO2: 26 mmol/L (ref 22–32)
Calcium: 8.6 mg/dL — ABNORMAL LOW (ref 8.9–10.3)
Chloride: 89 mmol/L — ABNORMAL LOW (ref 98–111)
Creatinine, Ser: 1.21 mg/dL — ABNORMAL HIGH (ref 0.44–1.00)
GFR calc Af Amer: 49 mL/min — ABNORMAL LOW (ref >60)
GFR calc non Af Amer: 42 mL/min — ABNORMAL LOW (ref >60)
Glucose, Bld: 112 mg/dL — ABNORMAL HIGH (ref 70–99)
Potassium: 4.3 mmol/L (ref 3.5–5.1)
Sodium: 126 mmol/L — ABNORMAL LOW (ref 135–145)

## 2019-02-07 LAB — ABO/RH: ABO/RH(D): B POS

## 2019-02-07 LAB — SARS CORONAVIRUS 2 (TAT 6-24 HRS): SARS Coronavirus 2: NEGATIVE

## 2019-02-07 LAB — SURGICAL PCR SCREEN
MRSA, PCR: NEGATIVE
Staphylococcus aureus: NEGATIVE

## 2019-02-10 NOTE — Progress Notes (Signed)
Anesthesia Chart Review   Case: [id number] Date/Time: 02/11/19 1240   Procedure: TOTAL KNEE ARTHROPLASTY (Left ) - 70 mins   Anesthesia type: Spinal   Pre-op diagnosis: Left knee osteoarthritis   Location: [place] [facility] / [facility]   Surgeon: Paralee Cancel, MD      DISCUSSION:83 y.o. former smoker (15 pack years, 07/10/88) with h/o nonobstructive CAD, hypothyroidism, DVT, anxiety, HTN, RA, hiatal hernia, anemia, renal insufficiency, left knee OA scheduled for above procedure 02/11/2019 with Dr. Paralee Cancel.   Pt last seen by cardiology 01/30/2019.  Seen by Daune Perch, NP.  Per OV note, "Based on ACC/AHA guidelines, Kathleen Paul would be at acceptable risk for the planned procedure without further cardiovascular testing.  It is OK to hold aspirin as needed for surgery."  Sodium 126 at PAT, surgeon made aware.  Will repeat DOS.  Anticipate pt can proceed with planned procedure barring acute status change.   VS: BP (!) 142/61   Pulse 62   Temp 36.6 C   Resp 20   Ht 5' (1.524 m)   Wt 67.1 kg   SpO2 100%   BMI 28.90 kg/m   PROVIDERS: [name], NP is PCP   Dorris Carnes, MD is Cardiologist   Joni Fears, MD is nephrologist  LABS: Surgeon made aware of sodium.  Will repeat BMP DOS (all labs ordered are listed, but only abnormal results are displayed)  Labs Reviewed  CBC - Abnormal; Notable for the following components:      Result Value   RBC 3.36 (*)    Hemoglobin 10.5 (*)    HCT 32.4 (*)    All other components within normal limits  BASIC METABOLIC PANEL - Abnormal; Notable for the following components:   Sodium 126 (*)    Chloride 89 (*)    Glucose, Bld 112 (*)    BUN 24 (*)    Creatinine, Ser 1.21 (*)    Calcium 8.6 (*)    GFR calc non Af Amer 42 (*)    GFR calc Af Amer 49 (*)    All other components within normal limits  SURGICAL PCR SCREEN  TYPE AND SCREEN  ABO/RH     IMAGES:   EKG: 01/30/2019 Rate 57 bpm Sinus bradycardia  Right bundle branch block    CV: Cardiac Cath 05/10/2010 Cardiac cath with mild to moderate disease in LAD.  Patient with small hematoma in R groin and will need to rule out AV fistula.   Echo 04/21/2010 Mildly dilated left ventricle with rest of the chambers of normal size.  Normal LV systolic function Mild diastolic dysfunction.  Trace pulmonary regurgitation.  Mild tricuspid regurgitation.  Normal pulmonary artery pressure.  Mild-moderate mitral regurgitation.  No pericardial effusion.   ETT 04/20/2010 Defect could by due to artifact vs. Ischemia.  Correlate clinically.  Past Medical History:  Diagnosis Date  . Anxiety state, unspecified   . Arthritis   . Atherosclerosis   . Diverticulosis   . Esophageal stricture   . Essential hypertension, benign   . Gastric ulcer   . Hiatal hernia   . History of DVT (deep vein thrombosis)    legs  . IDA (iron deficiency anemia)   . Inguinal hernia without mention of obstruction or gangrene, unilateral or unspecified, (not specified as recurrent)    2014  . Internal hemorrhoids   . Mild mitral regurgitation    a. 04/2010 Echo: nl EF, mild diast dysfxn, trace PR, mild TR, mild-mod MR.  Marland Kitchen  Non-obstructive CAD    a. 04/2010 Myoview: EF 77%, fixed apical/inferior defect;  b. 04/2010 Cath: LAD 65m, LCX/RCA minor irregs, EF 60%;  c. 04/2011 Myoview: low risk.  . Pure hypercholesterolemia   . Scoliosis   . UI (urinary incontinence)   . Umbilical hernia without mention of obstruction or gangrene    2014  . Unspecified hypothyroidism   . Vitamin D deficiency     Past Surgical History:  Procedure Laterality Date  . ABDOMINAL HYSTERECTOMY  2008  . BIOPSY  04/02/2018   Procedure: BIOPSY;  Surgeon: Rush Landmark Telford Nab., MD;  Location: [facility];  Service: Gastroenterology;;  . CARDIAC CATHETERIZATION  2011   [facility]  . COLONOSCOPY  2003   [facility]  . ESOPHAGOGASTRODUODENOSCOPY (EGD) WITH PROPOFOL N/A 04/02/2018   Procedure: ESOPHAGOGASTRODUODENOSCOPY (EGD)  WITH PROPOFOL;  Surgeon: Rush Landmark Telford Nab., MD;  Location: [facility];  Service: Gastroenterology;  Laterality: N/A;  . FOREIGN BODY REMOVAL  04/02/2018   Procedure: FOREIGN BODY REMOVAL;  Surgeon: Rush Landmark Telford Nab., MD;  Location: [facility];  Service: Gastroenterology;;  . HERNIA REPAIR Right 09/23/2012   repair [facility]  . LAPAROSCOPIC HYSTERECTOMY    . TONSILLECTOMY      MEDICATIONS: . aspirin EC 81 MG tablet  . Calcium Carb-Cholecalciferol (CALCIUM 600+D3 PO)  . enalapril-hydrochlorothiazide (VASERETIC) 10-25 MG tablet  . ferrous sulfate 325 (65 FE) MG tablet  . levothyroxine (SYNTHROID) 88 MCG tablet  . metoprolol tartrate (LOPRESSOR) 25 MG tablet  . Multiple Vitamin (MULTIVITAMIN PO)  . omeprazole (PRILOSEC) 40 MG capsule  . OVER THE COUNTER MEDICATION  . simvastatin (ZOCOR) 20 MG tablet   No current facility-administered medications for this encounter.     Maia Plan [facility] Pre-Surgical Testing [phone] 02/10/19  9:34 AM

## 2019-02-10 NOTE — Anesthesia Preprocedure Evaluation (Addendum)
Anesthesia Evaluation  Patient identified by MRN, date of birth, ID band Patient awake    Reviewed: Allergy & Precautions, H&P , NPO status , Patient's Chart, lab work & pertinent test results, reviewed documented beta blocker date and time   Airway Mallampati: II  TM Distance: >3 FB Neck ROM: Full    Dental no notable dental hx. (+) Partial Lower, Partial Upper, Dental Advisory Given   Pulmonary neg pulmonary ROS, former smoker,    Pulmonary exam normal breath sounds clear to auscultation       Cardiovascular hypertension, Pt. on medications and Pt. on home beta blockers + CAD   Rhythm:Regular Rate:Normal     Neuro/Psych Anxiety negative neurological ROS     GI/Hepatic Neg liver ROS, hiatal hernia, PUD, GERD  Medicated and Controlled,  Endo/Other  Hypothyroidism   Renal/GU Renal InsufficiencyRenal disease  negative genitourinary   Musculoskeletal  (+) Arthritis , Osteoarthritis,    Abdominal   Peds  Hematology  (+) Blood dyscrasia, anemia ,   Anesthesia Other Findings   Reproductive/Obstetrics negative OB ROS                          Anesthesia Physical Anesthesia Plan  ASA: II  Anesthesia Plan: Spinal   Post-op Pain Management:  Regional for Post-op pain   Induction: Intravenous  PONV Risk Score and Plan: 3 and Ondansetron, Dexamethasone and Propofol infusion  Airway Management Planned: Simple Face Mask  Additional Equipment:   Intra-op Plan:   Post-operative Plan:   Informed Consent: I have reviewed the patients History and Physical, chart, labs and discussed the procedure including the risks, benefits and alternatives for the proposed anesthesia with the patient or authorized representative who has indicated his/her understanding and acceptance.     Dental advisory given  Plan Discussed with: CRNA  Anesthesia Plan Comments: (See PAT note 02/07/2019, Konrad Felix,  PA-C)       Anesthesia Quick Evaluation

## 2019-02-11 ENCOUNTER — Other Ambulatory Visit: Payer: Self-pay

## 2019-02-11 ENCOUNTER — Ambulatory Visit (HOSPITAL_COMMUNITY): Payer: PPO | Admitting: Physician Assistant

## 2019-02-11 ENCOUNTER — Encounter (HOSPITAL_COMMUNITY): Payer: Self-pay | Admitting: *Deleted

## 2019-02-11 ENCOUNTER — Observation Stay (HOSPITAL_COMMUNITY)
Admission: RE | Admit: 2019-02-11 | Discharge: 2019-02-12 | Disposition: A | Discharge status: Home / Self Care | Payer: PPO | Attending: Orthopedic Surgery | Admitting: Orthopedic Surgery

## 2019-02-11 ENCOUNTER — Encounter (HOSPITAL_COMMUNITY)
Admission: RE | Disposition: A | Discharge status: Home / Self Care | Payer: Self-pay | Source: Home / Self Care | Attending: Orthopedic Surgery

## 2019-02-11 DIAGNOSIS — M1712 Unilateral primary osteoarthritis, left knee: Secondary | ICD-10-CM | POA: Diagnosis not present

## 2019-02-11 DIAGNOSIS — Z79899 Other long term (current) drug therapy: Secondary | ICD-10-CM | POA: Insufficient documentation

## 2019-02-11 DIAGNOSIS — I10 Essential (primary) hypertension: Secondary | ICD-10-CM | POA: Insufficient documentation

## 2019-02-11 DIAGNOSIS — K219 Gastro-esophageal reflux disease without esophagitis: Secondary | ICD-10-CM | POA: Diagnosis not present

## 2019-02-11 DIAGNOSIS — E039 Hypothyroidism, unspecified: Secondary | ICD-10-CM | POA: Diagnosis not present

## 2019-02-11 DIAGNOSIS — D649 Anemia, unspecified: Secondary | ICD-10-CM | POA: Insufficient documentation

## 2019-02-11 DIAGNOSIS — Z86718 Personal history of other venous thrombosis and embolism: Secondary | ICD-10-CM | POA: Diagnosis not present

## 2019-02-11 DIAGNOSIS — E7800 Pure hypercholesterolemia, unspecified: Secondary | ICD-10-CM | POA: Diagnosis not present

## 2019-02-11 DIAGNOSIS — Z96652 Presence of left artificial knee joint: Secondary | ICD-10-CM

## 2019-02-11 DIAGNOSIS — Z7982 Long term (current) use of aspirin: Secondary | ICD-10-CM | POA: Insufficient documentation

## 2019-02-11 DIAGNOSIS — Z87891 Personal history of nicotine dependence: Secondary | ICD-10-CM | POA: Insufficient documentation

## 2019-02-11 DIAGNOSIS — E785 Hyperlipidemia, unspecified: Secondary | ICD-10-CM | POA: Insufficient documentation

## 2019-02-11 DIAGNOSIS — G8918 Other acute postprocedural pain: Secondary | ICD-10-CM | POA: Diagnosis not present

## 2019-02-11 DIAGNOSIS — I2510 Atherosclerotic heart disease of native coronary artery without angina pectoris: Secondary | ICD-10-CM | POA: Insufficient documentation

## 2019-02-11 HISTORY — PX: TOTAL KNEE ARTHROPLASTY: SHX125

## 2019-02-11 HISTORY — DX: Presence of left artificial knee joint: Z96.652

## 2019-02-11 LAB — BASIC METABOLIC PANEL
Anion gap: 10 (ref 5–15)
BUN: 28 mg/dL — ABNORMAL HIGH (ref 8–23)
CO2: 27 mmol/L (ref 22–32)
Calcium: 8.7 mg/dL — ABNORMAL LOW (ref 8.9–10.3)
Chloride: 92 mmol/L — ABNORMAL LOW (ref 98–111)
Creatinine, Ser: 1.08 mg/dL — ABNORMAL HIGH (ref 0.44–1.00)
GFR calc Af Amer: 56 mL/min — ABNORMAL LOW (ref >60)
GFR calc non Af Amer: 48 mL/min — ABNORMAL LOW (ref >60)
Glucose, Bld: 76 mg/dL (ref 70–99)
Potassium: 4 mmol/L (ref 3.5–5.1)
Sodium: 129 mmol/L — ABNORMAL LOW (ref 135–145)

## 2019-02-11 LAB — TYPE AND SCREEN
ABO/RH(D): B POS
Antibody Screen: NEGATIVE

## 2019-02-11 SURGERY — ARTHROPLASTY, KNEE, TOTAL
Anesthesia: Regional | Laterality: Left

## 2019-02-11 MED ORDER — BISACODYL 10 MG RE SUPP: 10.0000 mg | Freq: Every day | RECTAL | Status: DC | PRN

## 2019-02-11 MED ORDER — HYDROCODONE-ACETAMINOPHEN 7.5-325 MG PO TABS
1.0000 | ORAL_TABLET | ORAL | Status: DC | PRN
  Administered 2019-02-11: 1 via ORAL
  Administered 2019-02-12 (×2): 2 via ORAL
  Filled 2019-02-11: qty 2
  Filled 2019-02-11: qty 1
  Filled 2019-02-11: qty 2

## 2019-02-11 MED ORDER — KETOROLAC TROMETHAMINE 30 MG/ML IJ SOLN
INTRAMUSCULAR | Status: DC | PRN
  Administered 2019-02-11: 30 mg via INTRAMUSCULAR

## 2019-02-11 MED ORDER — SODIUM CHLORIDE (PF) 0.9 % IJ SOLN
INTRAMUSCULAR | Status: AC
  Filled 2019-02-11: qty 50

## 2019-02-11 MED ORDER — ACETAMINOPHEN 500 MG PO TABS
1000.0000 mg | ORAL_TABLET | Freq: Once | ORAL | Status: AC
  Administered 2019-02-11: 1000 mg via ORAL
  Filled 2019-02-11: qty 2

## 2019-02-11 MED ORDER — BUPIVACAINE IN DEXTROSE 0.75-8.25 % IT SOLN
INTRATHECAL | Status: DC | PRN
  Administered 2019-02-11: 1.6 mL via INTRATHECAL

## 2019-02-11 MED ORDER — 0.9 % SODIUM CHLORIDE (POUR BTL) OPTIME
TOPICAL | Status: DC | PRN
  Administered 2019-02-11: 13:00:00 1000 mL

## 2019-02-11 MED ORDER — PROPOFOL 500 MG/50ML IV EMUL
INTRAVENOUS | Status: DC | PRN
  Administered 2019-02-11: 40 ug/kg/min via INTRAVENOUS

## 2019-02-11 MED ORDER — ONDANSETRON HCL 4 MG/2ML IJ SOLN
INTRAMUSCULAR | Status: AC
  Filled 2019-02-11: qty 2

## 2019-02-11 MED ORDER — RIVAROXABAN 10 MG PO TABS
10.0000 mg | ORAL_TABLET | ORAL | Status: DC
  Administered 2019-02-12: 10 mg via ORAL
  Filled 2019-02-11: qty 1

## 2019-02-11 MED ORDER — DIPHENHYDRAMINE HCL 12.5 MG/5ML PO ELIX: 12.5000 mg | ORAL_SOLUTION | ORAL | Status: DC | PRN

## 2019-02-11 MED ORDER — HYDROMORPHONE HCL 1 MG/ML IJ SOLN: 0.5000 mg | INTRAMUSCULAR | Status: DC | PRN

## 2019-02-11 MED ORDER — OMEPRAZOLE 20 MG PO CPDR
40.0000 mg | DELAYED_RELEASE_CAPSULE | Freq: Every day | ORAL | Status: DC
  Administered 2019-02-12: 40 mg via ORAL
  Filled 2019-02-11: qty 2

## 2019-02-11 MED ORDER — PROPOFOL 10 MG/ML IV BOLUS
INTRAVENOUS | Status: AC
  Filled 2019-02-11: qty 40

## 2019-02-11 MED ORDER — TRANEXAMIC ACID-NACL 1000-0.7 MG/100ML-% IV SOLN
1000.0000 mg | INTRAVENOUS | Status: DC
  Filled 2019-02-11: qty 100

## 2019-02-11 MED ORDER — MAGNESIUM CITRATE PO SOLN: 1.0000 | Freq: Once | ORAL | Status: DC | PRN

## 2019-02-11 MED ORDER — HYDROMORPHONE HCL 1 MG/ML IJ SOLN
INTRAMUSCULAR | Status: AC
  Filled 2019-02-11: qty 2

## 2019-02-11 MED ORDER — HYDROMORPHONE HCL 1 MG/ML IJ SOLN
0.2500 mg | INTRAMUSCULAR | Status: DC | PRN
  Administered 2019-02-11: 0.25 mg via INTRAVENOUS
  Administered 2019-02-11: 0.5 mg via INTRAVENOUS
  Administered 2019-02-11: 0.25 mg via INTRAVENOUS
  Administered 2019-02-11 (×2): 0.5 mg via INTRAVENOUS

## 2019-02-11 MED ORDER — LEVOTHYROXINE SODIUM 88 MCG PO TABS
88.0000 ug | ORAL_TABLET | Freq: Every day | ORAL | Status: DC
  Administered 2019-02-12: 88 ug via ORAL
  Filled 2019-02-11: qty 1

## 2019-02-11 MED ORDER — FERROUS SULFATE 325 (65 FE) MG PO TABS
325.0000 mg | ORAL_TABLET | Freq: Two times a day (BID) | ORAL | Status: DC
  Administered 2019-02-12: 325 mg via ORAL
  Filled 2019-02-11: qty 1

## 2019-02-11 MED ORDER — SIMVASTATIN 20 MG PO TABS
20.0000 mg | ORAL_TABLET | Freq: Every day | ORAL | Status: DC
  Administered 2019-02-11: 20 mg via ORAL
  Filled 2019-02-11: qty 1

## 2019-02-11 MED ORDER — METHOCARBAMOL 500 MG IVPB - SIMPLE MED
500.0000 mg | Freq: Four times a day (QID) | INTRAVENOUS | Status: DC | PRN
  Administered 2019-02-11: 500 mg via INTRAVENOUS
  Filled 2019-02-11: qty 50

## 2019-02-11 MED ORDER — HYDROCODONE-ACETAMINOPHEN 5-325 MG PO TABS
1.0000 | ORAL_TABLET | ORAL | Status: DC | PRN
  Administered 2019-02-11: 2 via ORAL
  Filled 2019-02-11: qty 2

## 2019-02-11 MED ORDER — BUPIVACAINE-EPINEPHRINE (PF) 0.25% -1:200000 IJ SOLN
INTRAMUSCULAR | Status: AC
  Filled 2019-02-11: qty 30

## 2019-02-11 MED ORDER — FENTANYL CITRATE (PF) 100 MCG/2ML IJ SOLN
INTRAMUSCULAR | Status: DC | PRN
  Administered 2019-02-11 (×4): 25 ug via INTRAVENOUS

## 2019-02-11 MED ORDER — CEFAZOLIN SODIUM-DEXTROSE 2-4 GM/100ML-% IV SOLN
2.0000 g | Freq: Four times a day (QID) | INTRAVENOUS | Status: AC
  Administered 2019-02-11 – 2019-02-12 (×2): 2 g via INTRAVENOUS
  Filled 2019-02-11 (×3): qty 100

## 2019-02-11 MED ORDER — LACTATED RINGERS IV SOLN
INTRAVENOUS | Status: DC
  Administered 2019-02-11: 11:00:00 via INTRAVENOUS

## 2019-02-11 MED ORDER — TRANEXAMIC ACID-NACL 1000-0.7 MG/100ML-% IV SOLN
1000.0000 mg | Freq: Once | INTRAVENOUS | Status: AC
  Administered 2019-02-11: 1000 mg via INTRAVENOUS
  Filled 2019-02-11: qty 100

## 2019-02-11 MED ORDER — METOCLOPRAMIDE HCL 5 MG/ML IJ SOLN: 5.0000 mg | Freq: Three times a day (TID) | INTRAMUSCULAR | Status: DC | PRN

## 2019-02-11 MED ORDER — SODIUM CHLORIDE 0.9 % IV SOLN
INTRAVENOUS | Status: DC
  Administered 2019-02-11 – 2019-02-12 (×2): via INTRAVENOUS

## 2019-02-11 MED ORDER — PHENOL 1.4 % MT LIQD: 1.0000 | OROMUCOSAL | Status: DC | PRN

## 2019-02-11 MED ORDER — DEXAMETHASONE SODIUM PHOSPHATE 10 MG/ML IJ SOLN
10.0000 mg | Freq: Once | INTRAMUSCULAR | Status: AC
  Administered 2019-02-11: 8 mg via INTRAVENOUS

## 2019-02-11 MED ORDER — POLYETHYLENE GLYCOL 3350 17 G PO PACK
17.0000 g | PACK | Freq: Two times a day (BID) | ORAL | Status: DC
  Administered 2019-02-11: 17 g via ORAL
  Filled 2019-02-11 (×2): qty 1

## 2019-02-11 MED ORDER — FENTANYL CITRATE (PF) 100 MCG/2ML IJ SOLN
50.0000 ug | INTRAMUSCULAR | Status: DC
  Administered 2019-02-11: 50 ug via INTRAVENOUS
  Filled 2019-02-11: qty 2

## 2019-02-11 MED ORDER — ONDANSETRON HCL 4 MG/2ML IJ SOLN
INTRAMUSCULAR | Status: DC | PRN
  Administered 2019-02-11: 4 mg via INTRAVENOUS

## 2019-02-11 MED ORDER — METHOCARBAMOL 500 MG PO TABS
500.0000 mg | ORAL_TABLET | Freq: Four times a day (QID) | ORAL | Status: DC | PRN
  Administered 2019-02-11 – 2019-02-12 (×2): 500 mg via ORAL
  Filled 2019-02-11 (×2): qty 1

## 2019-02-11 MED ORDER — STERILE WATER FOR IRRIGATION IR SOLN
Status: DC | PRN
  Administered 2019-02-11: 2000 mL

## 2019-02-11 MED ORDER — DOCUSATE SODIUM 100 MG PO CAPS
100.0000 mg | ORAL_CAPSULE | Freq: Two times a day (BID) | ORAL | Status: DC
  Administered 2019-02-11 – 2019-02-12 (×2): 100 mg via ORAL
  Filled 2019-02-11 (×2): qty 1

## 2019-02-11 MED ORDER — DEXAMETHASONE SODIUM PHOSPHATE 10 MG/ML IJ SOLN
10.0000 mg | Freq: Once | INTRAMUSCULAR | Status: AC
  Administered 2019-02-12: 10 mg via INTRAVENOUS
  Filled 2019-02-11: qty 1

## 2019-02-11 MED ORDER — MENTHOL 3 MG MT LOZG: 1.0000 | LOZENGE | OROMUCOSAL | Status: DC | PRN

## 2019-02-11 MED ORDER — ALUM & MAG HYDROXIDE-SIMETH 200-200-20 MG/5ML PO SUSP: 15.0000 mL | ORAL | Status: DC | PRN

## 2019-02-11 MED ORDER — ACETAMINOPHEN 325 MG PO TABS: 325.0000 mg | ORAL_TABLET | Freq: Four times a day (QID) | ORAL | Status: DC | PRN

## 2019-02-11 MED ORDER — METOPROLOL TARTRATE 25 MG PO TABS
25.0000 mg | ORAL_TABLET | Freq: Every evening | ORAL | Status: DC
  Filled 2019-02-11: qty 1

## 2019-02-11 MED ORDER — PROPOFOL 10 MG/ML IV BOLUS
INTRAVENOUS | Status: DC | PRN
  Administered 2019-02-11: 50 mg via INTRAVENOUS

## 2019-02-11 MED ORDER — POVIDONE-IODINE 10 % EX SWAB
2.0000 "application " | Freq: Once | CUTANEOUS | Status: AC
  Administered 2019-02-11: 2 via TOPICAL

## 2019-02-11 MED ORDER — METHOCARBAMOL 500 MG IVPB - SIMPLE MED
INTRAVENOUS | Status: AC
  Filled 2019-02-11: qty 50

## 2019-02-11 MED ORDER — KETOROLAC TROMETHAMINE 30 MG/ML IJ SOLN
INTRAMUSCULAR | Status: AC
  Filled 2019-02-11: qty 1

## 2019-02-11 MED ORDER — ONDANSETRON HCL 4 MG PO TABS: 4.0000 mg | ORAL_TABLET | Freq: Four times a day (QID) | ORAL | Status: DC | PRN

## 2019-02-11 MED ORDER — ONDANSETRON HCL 4 MG/2ML IJ SOLN: 4.0000 mg | Freq: Four times a day (QID) | INTRAMUSCULAR | Status: DC | PRN

## 2019-02-11 MED ORDER — SODIUM CHLORIDE (PF) 0.9 % IJ SOLN
INTRAMUSCULAR | Status: DC | PRN
  Administered 2019-02-11: 50 mL

## 2019-02-11 MED ORDER — DEXAMETHASONE SODIUM PHOSPHATE 10 MG/ML IJ SOLN
INTRAMUSCULAR | Status: AC
  Filled 2019-02-11: qty 1

## 2019-02-11 MED ORDER — CEFAZOLIN SODIUM-DEXTROSE 2-4 GM/100ML-% IV SOLN
2.0000 g | INTRAVENOUS | Status: AC
  Administered 2019-02-11: 2 g via INTRAVENOUS
  Filled 2019-02-11: qty 100

## 2019-02-11 MED ORDER — METOCLOPRAMIDE HCL 5 MG PO TABS: 5.0000 mg | ORAL_TABLET | Freq: Three times a day (TID) | ORAL | Status: DC | PRN

## 2019-02-11 MED ORDER — BUPIVACAINE-EPINEPHRINE (PF) 0.25% -1:200000 IJ SOLN
INTRAMUSCULAR | Status: DC | PRN
  Administered 2019-02-11: 30 mL

## 2019-02-11 MED ORDER — BUPIVACAINE-EPINEPHRINE (PF) 0.5% -1:200000 IJ SOLN
INTRAMUSCULAR | Status: DC | PRN
  Administered 2019-02-11: 20 mL via PERINEURAL

## 2019-02-11 MED ORDER — CHLORHEXIDINE GLUCONATE 4 % EX LIQD: 60.0000 mL | Freq: Once | CUTANEOUS | Status: DC

## 2019-02-11 SURGICAL SUPPLY — 60 items
ADH SKN CLS APL DERMABOND .7 (GAUZE/BANDAGES/DRESSINGS) ×1
ATTUNE MED ANAT PAT 32 KNEE (Knees) ×1 IMPLANT
ATTUNE PSFEM LTSZ3 NARCEM KNEE (Femur) ×1 IMPLANT
ATTUNE PSRP INSR SZ3 8 KNEE (Insert) ×1 IMPLANT
BAG SPEC THK2 15X12 ZIP CLS (MISCELLANEOUS)
BAG ZIPLOCK 12X15 (MISCELLANEOUS) IMPLANT
BASE TIBIAL ROT PLAT SZ 3 KNEE (Knees) IMPLANT
BLADE SAW SGTL 11.0X1.19X90.0M (BLADE) IMPLANT
BLADE SAW SGTL 13.0X1.19X90.0M (BLADE) ×2 IMPLANT
BLADE SURG SZ10 CARB STEEL (BLADE) ×4 IMPLANT
BNDG ELASTIC 6X5.8 VLCR STR LF (GAUZE/BANDAGES/DRESSINGS) ×2 IMPLANT
BOWL SMART MIX CTS (DISPOSABLE) ×2 IMPLANT
BSPLAT TIB 3 CMNT ROT PLAT STR (Knees) ×1 IMPLANT
CEMENT HV SMART SET (Cement) ×2 IMPLANT
COVER SURGICAL LIGHT HANDLE (MISCELLANEOUS) ×2 IMPLANT
COVER WAND RF STERILE (DRAPES) IMPLANT
CUFF TOURN SGL QUICK 34 (TOURNIQUET CUFF) ×2
CUFF TRNQT CYL 34X4.125X (TOURNIQUET CUFF) ×1 IMPLANT
DECANTER SPIKE VIAL GLASS SM (MISCELLANEOUS) ×4 IMPLANT
DERMABOND ADVANCED (GAUZE/BANDAGES/DRESSINGS) ×1
DERMABOND ADVANCED .7 DNX12 (GAUZE/BANDAGES/DRESSINGS) ×1 IMPLANT
DRAPE U-SHAPE 47X51 STRL (DRAPES) ×2 IMPLANT
DRESSING AQUACEL AG SP 3.5X10 (GAUZE/BANDAGES/DRESSINGS) ×1 IMPLANT
DRSG AQUACEL AG SP 3.5X10 (GAUZE/BANDAGES/DRESSINGS) ×2
DURAPREP 26ML APPLICATOR (WOUND CARE) ×4 IMPLANT
ELECT REM PT RETURN 15FT ADLT (MISCELLANEOUS) ×2 IMPLANT
GLOVE BIO SURGEON STRL SZ 6 (GLOVE) ×2 IMPLANT
GLOVE BIOGEL PI IND STRL 6.5 (GLOVE) ×1 IMPLANT
GLOVE BIOGEL PI IND STRL 7.5 (GLOVE) ×1 IMPLANT
GLOVE BIOGEL PI IND STRL 8.5 (GLOVE) ×1 IMPLANT
GLOVE BIOGEL PI INDICATOR 6.5 (GLOVE) ×1
GLOVE BIOGEL PI INDICATOR 7.5 (GLOVE) ×1
GLOVE BIOGEL PI INDICATOR 8.5 (GLOVE) ×1
GLOVE ECLIPSE 8.0 STRL XLNG CF (GLOVE) ×2 IMPLANT
GLOVE ORTHO TXT STRL SZ7.5 (GLOVE) ×2 IMPLANT
GOWN STRL REUS W/ TWL LRG LVL3 (GOWN DISPOSABLE) ×1 IMPLANT
GOWN STRL REUS W/TWL 2XL LVL3 (GOWN DISPOSABLE) ×2 IMPLANT
GOWN STRL REUS W/TWL LRG LVL3 (GOWN DISPOSABLE) ×4 IMPLANT
HANDPIECE INTERPULSE COAX TIP (DISPOSABLE) ×2
HOLDER FOLEY CATH W/STRAP (MISCELLANEOUS) ×1 IMPLANT
KIT TURNOVER KIT A (KITS) ×1 IMPLANT
MANIFOLD NEPTUNE II (INSTRUMENTS) ×2 IMPLANT
NDL SAFETY ECLIPSE 18X1.5 (NEEDLE) IMPLANT
NEEDLE HYPO 18GX1.5 SHARP (NEEDLE) ×2
NS IRRIG 1000ML POUR BTL (IV SOLUTION) ×2 IMPLANT
PACK TOTAL KNEE CUSTOM (KITS) ×2 IMPLANT
PROTECTOR NERVE ULNAR (MISCELLANEOUS) ×2 IMPLANT
SET HNDPC FAN SPRY TIP SCT (DISPOSABLE) ×1 IMPLANT
SET PAD KNEE POSITIONER (MISCELLANEOUS) ×2 IMPLANT
SUT MNCRL AB 4-0 PS2 18 (SUTURE) ×2 IMPLANT
SUT STRATAFIX PDS+ 0 24IN (SUTURE) ×2 IMPLANT
SUT VIC AB 1 CT1 36 (SUTURE) ×2 IMPLANT
SUT VIC AB 2-0 CT1 27 (SUTURE) ×6
SUT VIC AB 2-0 CT1 TAPERPNT 27 (SUTURE) ×3 IMPLANT
SYR 3ML LL SCALE MARK (SYRINGE) ×2 IMPLANT
TIBIAL BASE ROT PLAT SZ 3 KNEE (Knees) ×2 IMPLANT
TRAY FOLEY MTR SLVR 14FR STAT (SET/KITS/TRAYS/PACK) ×1 IMPLANT
WATER STERILE IRR 1000ML POUR (IV SOLUTION) ×4 IMPLANT
WRAP KNEE MAXI GEL POST OP (GAUZE/BANDAGES/DRESSINGS) ×2 IMPLANT
YANKAUER SUCT BULB TIP 10FT TU (MISCELLANEOUS) ×2 IMPLANT

## 2019-02-11 NOTE — Anesthesia Procedure Notes (Addendum)
Spinal  Patient location during procedure: OR Start time: 02/11/2019 12:20 PM End time: 02/11/2019 12:22 PM Staffing Anesthesiologist: Roderic Palau, MD Performed: anesthesiologist  Preanesthetic Checklist Completed: patient identified, surgical consent, pre-op evaluation, timeout performed, IV checked, risks and benefits discussed and monitors and equipment checked Spinal Block Patient position: sitting Prep: DuraPrep Patient monitoring: cardiac monitor, continuous pulse ox and blood pressure Approach: midline Location: L3-4 Injection technique: single-shot Needle Needle type: Pencan  Needle gauge: 24 G Needle length: 9 cm Assessment Events: failed spinal Additional Notes Functioning IV was confirmed and monitors were applied. Sterile prep and drape, including hand hygiene and sterile gloves were used. The patient was positioned and the spine was prepped. The skin was anesthetized with lidocaine.  Free flow of clear CSF was obtained prior to injecting local anesthetic into the CSF.  The spinal needle aspirated freely following injection.  The needle was carefully withdrawn.  The patient tolerated the procedure well. CRNA attempted at L2-3 prior to my attempt at L3-4.

## 2019-02-11 NOTE — Op Note (Signed)
NAME:  Kathleen Paul                      MEDICAL RECORD NO.:  [id number]                             FACILITY:  [facility]      PHYSICIAN:  Pietro Cassis. Alvan Dame, M.D.  DATE OF BIRTH:  [birth year]      DATE OF PROCEDURE:  02/11/2019                                     OPERATIVE REPORT         PREOPERATIVE DIAGNOSIS:  Left knee osteoarthritis.      POSTOPERATIVE DIAGNOSIS:  Left knee osteoarthritis.      FINDINGS:  The patient was noted to have complete loss of cartilage and   bone-on-bone arthritis with associated osteophytes in the lateral and patellofemoral compartments of   the knee with a significant deformity of the lateral femoral condyle and patella, synovitis and associated effusion.  The patient had failed months of conservative treatment including medications, injection therapy, activity modification.     PROCEDURE:  Left total knee replacement.      COMPONENTS USED:  DePuy Attune rotating platform posterior stabilized knee   system, a size 3N femur, 3 tibia, size 8 mm PS AOX insert, and 32 anatomic patellar   button.      SURGEON:  Pietro Cassis. Alvan Dame, M.D.      ASSISTANT:  Griffith Citron, PA-C.      ANESTHESIA:  Regional and Spinal.      SPECIMENS:  None.      COMPLICATION:  None.      DRAINS:  None.  EBL: <100cc      TOURNIQUET TIME:   Total Tourniquet Time Documented: Thigh (Left) - 38 minutes Total: Thigh (Left) - 38 minutes  .      The patient was stable to the recovery room.      INDICATION FOR PROCEDURE:  Kathleen Paul is a 83 y.o. female patient of   mine.  The patient had been seen, evaluated, and treated for months conservatively in the   office with medication, activity modification, and injections.  The patient had   radiographic changes of bone-on-bone arthritis with endplate sclerosis and osteophytes noted.  Based on the radiographic changes and failed conservative measures, the patient   decided to proceed with definitive treatment, total knee  replacement.  Risks of infection, DVT, component failure, need for revision surgery, neurovascular injury were reviewed in the office setting.  The postop course was reviewed stressing the efforts to maximize post-operative satisfaction and function.  Consent was obtained for benefit of pain   relief.      PROCEDURE IN DETAIL:  The patient was brought to the operative theater.   Once adequate anesthesia, preoperative antibiotics, 2 gm of Ancef,1 gm of Tranexamic Acid, and 10 mg of Decadron administered, the patient was positioned supine with a left thigh tourniquet placed.  The  left lower extremity was prepped and draped in sterile fashion.  A time-   out was performed identifying the patient, planned procedure, and the appropriate extremity.      The left lower extremity was placed in the [facility] leg holder.  The leg was   exsanguinated, tourniquet elevated to 250 mmHg.  A midline incision was   made followed by median parapatellar arthrotomy.  Following initial   exposure, attention was first directed to the patella.  The lateral facet of the patella was very thin, probably less than 56mm. The precut   measurement at the medial facet was noted to be 16 mm.  I resected down to 13 mm and used a   32 anatomic patellar button to restore patellar height as well as cover the cut surface. Note that I scrapped soft tissue from the eroded away lateral facet.     The lug holes were drilled and a metal shim was placed to protect the   patella from retractors and saw blade during the procedure.      At this point, attention was now directed to the femur.  The femoral   canal was opened with a drill, irrigated to try to prevent fat emboli.  An   intramedullary rod was passed at 3 degrees valgus, 9 mm of bone was   resected off the distal femur.  Following this resection, the tibia was   subluxated anteriorly.  Using the extramedullary guide, 2 mm of bone was resected off   the proximal lateral tibia.  We  confirmed the gap would be   stable medially and laterally with a size 6 spacer block as well as confirmed that the tibial cut was perpendicular in the coronal plane, checking with an alignment rod.      Once this was done, I sized the femur to be a size 3 in the anterior-   posterior dimension, chose a narrow component based on medial and   lateral dimension.  The size 3 rotation block was then pinned in   position anterior referenced using the C-clamp to set rotation.  The   anterior, posterior, and  chamfer cuts were made without difficulty nor   notching making certain that I was along the anterior cortex to help   with flexion gap stability.      The final box cut was made off the lateral aspect of distal femur.      At this point, the tibia was sized to be a size 3.  The size 3 tray was   then pinned in position through the medial third of the tubercle,   drilled, and keel punched.  Trial reduction was now carried with a 3 femur,  3 tibia, up to the 8 mm PS insert, and the 32 anatomic patella botton.  The knee was brought to full extension with good flexion stability and now with the patella   tracking through the trochlea without application of pressure.  Given   all these findings the trial components removed.  Final components were   opened and cement was mixed.  The knee was irrigated with normal saline solution and pulse lavage.  The synovial lining was   then injected with 30 cc of 0.25% Marcaine with epinephrine, 1 cc of Toradol and 30 cc of NS for a total of 61 cc.     Final implants were then cemented onto cleaned and dried cut surfaces of bone with the knee brought to extension with a size 8 mm PS trial insert.      Once the cement had fully cured, excess cement was removed   throughout the knee.  I confirmed that I was satisfied with the range of   motion and stability, and the final size 8 mm PS AOX insert was chosen.  It was  placed into the knee.      The tourniquet  had been let down at 38 minutes.  No significant   hemostasis was required.  The extensor mechanism was then reapproximated using #1 Vicryl and #1 Stratafix sutures with the knee   in flexion.  The   remaining wound was closed with 2-0 Vicryl and running 4-0 Monocryl.   The knee was cleaned, dried, dressed sterilely using Dermabond and   Aquacel dressing.  The patient was then   brought to recovery room in stable condition, tolerating the procedure   well.   Please note that Physician Assistant, Griffith Citron, PA-C was present for the entirety of the case, and was utilized for pre-operative positioning, peri-operative retractor management, general facilitation of the procedure and for primary wound closure at the end of the case.              Pietro Cassis Alvan Dame, M.D.    02/11/2019 1:52 PM

## 2019-02-11 NOTE — Anesthesia Procedure Notes (Signed)
Anesthesia Regional Block: Adductor canal block   Pre-Anesthetic Checklist: ,, timeout performed, Correct Patient, Correct Site, Correct Laterality, Correct Procedure, Correct Position, site marked, Risks and benefits discussed, pre-op evaluation,  At surgeon's request and post-op pain management  Laterality: Left  Prep: Maximum Sterile Barrier Precautions used, chloraprep       Needles:  Injection technique: Single-shot  Needle Type: Echogenic Stimulator Needle     Needle Length: 9cm  Needle Gauge: 21     Additional Needles:   Procedures:,,,, ultrasound used (permanent image in chart),,,,  Narrative:  Start time: 02/11/2019 11:42 AM End time: 02/11/2019 11:52 AM Injection made incrementally with aspirations every 5 mL.  Performed by: Personally  Anesthesiologist: Roderic Palau, MD  Additional Notes: 2% Lidocaine skin wheel.

## 2019-02-11 NOTE — Anesthesia Procedure Notes (Signed)
Procedure Name: LMA Insertion Date/Time: 02/11/2019 12:56 PM Performed by: Victoriano Lain, CRNA Pre-anesthesia Checklist: Patient identified, Emergency Drugs available, Suction available, Patient being monitored and Timeout performed Patient Re-evaluated:Patient Re-evaluated prior to induction Oxygen Delivery Method: Circle system utilized Preoxygenation: Pre-oxygenation with 100% oxygen Induction Type: IV induction LMA: LMA with gastric port inserted LMA Size: 4.0 Number of attempts: 1 Placement Confirmation: positive ETCO2 and breath sounds checked- equal and bilateral Tube secured with: Tape Dental Injury: Teeth and Oropharynx as per pre-operative assessment

## 2019-02-11 NOTE — Progress Notes (Signed)
Metoprolol due but patient's heart rate is 54. Call made to office to speak to a provider about holding metoprolol. Answering service will have a provider call me back. Donne Hazel, RN

## 2019-02-11 NOTE — Progress Notes (Signed)
Assisted Dr. Edmond Fitzgerald with left, ultrasound guided, adductor canal block. Side rails up, monitors on throughout procedure. See vital signs in flow sheet. Tolerated Procedure well. 

## 2019-02-11 NOTE — Discharge Instructions (Addendum)

## 2019-02-11 NOTE — Transfer of Care (Signed)
Immediate Anesthesia Transfer of Care Note  Patient: Kathleen Paul  Procedure(s) Performed: TOTAL KNEE ARTHROPLASTY (Left )  Patient Location: PACU  Anesthesia Type:GA combined with regional for post-op pain  Level of Consciousness: awake, alert , oriented and patient cooperative  Airway & Oxygen Therapy: Patient Spontanous Breathing and Patient connected to face mask oxygen  Post-op Assessment: Report given to RN and Post -op Vital signs reviewed and stable  Post vital signs: Reviewed and stable  Last Vitals:  Vitals Value Taken Time  BP 152/94 02/11/19 1428  Temp    Pulse 64 02/11/19 1430  Resp 13 02/11/19 1430  SpO2 100 % 02/11/19 1430  Vitals shown include unvalidated device data.  Last Pain:  Vitals:   02/11/19 1039  TempSrc: Oral      Patients Stated Pain Goal: 4 (83/43/73 5789)  Complications: No apparent anesthesia complications

## 2019-02-11 NOTE — Anesthesia Postprocedure Evaluation (Signed)
Anesthesia Post Note  Patient: Kathleen Paul  Procedure(s) Performed: TOTAL KNEE ARTHROPLASTY (Left )     Patient location during evaluation: PACU Anesthesia Type: Regional and General Level of consciousness: awake and alert Pain management: pain level controlled Vital Signs Assessment: post-procedure vital signs reviewed and stable Respiratory status: spontaneous breathing, nonlabored ventilation, respiratory function stable and patient connected to nasal cannula oxygen Cardiovascular status: blood pressure returned to baseline and stable Postop Assessment: no apparent nausea or vomiting Anesthetic complications: no    Last Vitals:  Vitals:   02/11/19 1515 02/11/19 1530  BP: (!) 172/73 (!) 170/81  Pulse: 66 68  Resp: 14 12  Temp:  (!) 36.4 C  SpO2: 100% 100%    Last Pain:  Vitals:   02/11/19 1530  TempSrc:   PainSc: 6                  England Greb,W. EDMOND

## 2019-02-11 NOTE — Interval H&P Note (Signed)
History and Physical Interval Note:  02/11/2019 11:14 AM  Kathleen Paul  has presented today for surgery, with the diagnosis of Left knee osteoarthritis.  The various methods of treatment have been discussed with the patient and family. After consideration of risks, benefits and other options for treatment, the patient has consented to  Procedure(s) with comments: TOTAL KNEE ARTHROPLASTY (Left) - 70 mins as a surgical intervention.  The patient's history has been reviewed, patient examined, no change in status, stable for surgery.  I have reviewed the patient's chart and labs.  Questions were answered to the patient's satisfaction.     [name]

## 2019-02-12 ENCOUNTER — Encounter (HOSPITAL_COMMUNITY): Payer: Self-pay | Admitting: Orthopedic Surgery

## 2019-02-12 DIAGNOSIS — M1712 Unilateral primary osteoarthritis, left knee: Secondary | ICD-10-CM | POA: Diagnosis not present

## 2019-02-12 LAB — BASIC METABOLIC PANEL
Anion gap: 8 (ref 5–15)
BUN: 27 mg/dL — ABNORMAL HIGH (ref 8–23)
CO2: 25 mmol/L (ref 22–32)
Calcium: 8 mg/dL — ABNORMAL LOW (ref 8.9–10.3)
Chloride: 94 mmol/L — ABNORMAL LOW (ref 98–111)
Creatinine, Ser: 1.16 mg/dL — ABNORMAL HIGH (ref 0.44–1.00)
GFR calc Af Amer: 51 mL/min — ABNORMAL LOW (ref >60)
GFR calc non Af Amer: 44 mL/min — ABNORMAL LOW (ref >60)
Glucose, Bld: 193 mg/dL — ABNORMAL HIGH (ref 70–99)
Potassium: 4.6 mmol/L (ref 3.5–5.1)
Sodium: 127 mmol/L — ABNORMAL LOW (ref 135–145)

## 2019-02-12 LAB — CBC
HCT: 25.3 % — ABNORMAL LOW (ref 36.0–46.0)
Hemoglobin: 8.1 g/dL — ABNORMAL LOW (ref 12.0–15.0)
MCH: 31.2 pg (ref 26.0–34.0)
MCHC: 32 g/dL (ref 30.0–36.0)
MCV: 97.3 fL (ref 80.0–100.0)
Platelets: 235 10*3/uL (ref 150–400)
RBC: 2.6 MIL/uL — ABNORMAL LOW (ref 3.87–5.11)
RDW: 13 % (ref 11.5–15.5)
WBC: 6.7 10*3/uL (ref 4.0–10.5)
nRBC: 0 % (ref 0.0–0.2)

## 2019-02-12 MED ORDER — METHOCARBAMOL 500 MG PO TABS: 500.0000 mg | ORAL_TABLET | Freq: Four times a day (QID) | ORAL | 0 refills | Status: DC | PRN

## 2019-02-12 MED ORDER — FERROUS SULFATE 325 (65 FE) MG PO TABS: 325.0000 mg | ORAL_TABLET | Freq: Three times a day (TID) | ORAL | 0 refills | Status: DC

## 2019-02-12 MED ORDER — DOCUSATE SODIUM 100 MG PO CAPS: 100.0000 mg | ORAL_CAPSULE | Freq: Two times a day (BID) | ORAL | 0 refills | Status: DC

## 2019-02-12 MED ORDER — POLYETHYLENE GLYCOL 3350 17 G PO PACK: 17.0000 g | PACK | Freq: Two times a day (BID) | ORAL | 0 refills | Status: DC

## 2019-02-12 MED ORDER — HYDROCODONE-ACETAMINOPHEN 7.5-325 MG PO TABS: 1.0000 | ORAL_TABLET | ORAL | 0 refills | Status: DC | PRN

## 2019-02-12 MED ORDER — RIVAROXABAN 10 MG PO TABS: 10.0000 mg | ORAL_TABLET | Freq: Every day | ORAL | 0 refills | Status: DC

## 2019-02-12 MED ORDER — ASPIRIN 81 MG PO CHEW: 81.0000 mg | CHEWABLE_TABLET | Freq: Two times a day (BID) | ORAL | 0 refills | Status: AC

## 2019-02-12 NOTE — Progress Notes (Signed)
Patient cleared by PT. DC information given to patient and daughter. Daughter to give patient ride home.

## 2019-02-12 NOTE — Progress Notes (Signed)
Subjective: 1 Day Post-Op Procedure(s) (LRB): TOTAL KNEE ARTHROPLASTY (Left) Patient reports pain as mild.   Patient seen in rounds for Dr. Alvan Dame. Patient is well, and has had no acute complaints or problems other than discomfort in the left knee. No acute events overnight. Patient states she is ready to go home today. Foley catheter removed.  We will start therapy today.   Objective: Vital signs in last 24 hours: Temp:  [97.5 F (36.4 C)-98.2 F (36.8 C)] 98.2 F (36.8 C) (08/05 0503) Pulse Rate:  [54-68] 65 (08/05 0503) Resp:  [10-20] 20 (08/05 0503) BP: (136-175)/(64-89) 143/74 (08/05 0503) SpO2:  [96 %-100 %] 96 % (08/05 0503) Weight:  [67.1 kg] 67.1 kg (08/04 1047)  Intake/Output from previous day:  Intake/Output Summary (Last 24 hours) at 02/12/2019 0814 Last data filed at 02/12/2019 0500 Gross per 24 hour  Intake 2708.93 ml  Output 1325 ml  Net 1383.93 ml     Intake/Output this shift: No intake/output data recorded.  Labs: Recent Labs    02/12/19 0448  HGB 8.1*   Recent Labs    02/12/19 0448  WBC 6.7  RBC 2.60*  HCT 25.3*  PLT 235   Recent Labs    02/11/19 1042 02/12/19 0448  NA 129* 127*  K 4.0 4.6  CL 92* 94*  CO2 27 25  BUN 28* 27*  CREATININE 1.08* 1.16*  GLUCOSE 76 193*  CALCIUM 8.7* 8.0*   No results for input(s): LABPT, INR in the last 72 hours.  Exam: General - Patient is Alert and Oriented Extremity - Neurologically intact Sensation intact distally Intact pulses distally Dorsiflexion/Plantar flexion intact Dressing - dressing C/D/I Motor Function - intact, moving foot and toes well on exam.   Past Medical History:  Diagnosis Date  . Anxiety state, unspecified   . Arthritis   . Atherosclerosis   . Diverticulosis   . Esophageal stricture   . Essential hypertension, benign   . Gastric ulcer   . Hiatal hernia   . History of DVT (deep vein thrombosis)    legs  . IDA (iron deficiency anemia)   . Inguinal hernia without  mention of obstruction or gangrene, unilateral or unspecified, (not specified as recurrent)    2014  . Internal hemorrhoids   . Mild mitral regurgitation    a. 04/2010 Echo: nl EF, mild diast dysfxn, trace PR, mild TR, mild-mod MR.  . Non-obstructive CAD    a. 04/2010 Myoview: EF 77%, fixed apical/inferior defect;  b. 04/2010 Cath: LAD 74m, LCX/RCA minor irregs, EF 60%;  c. 04/2011 Myoview: low risk.  . Pure hypercholesterolemia   . Scoliosis   . UI (urinary incontinence)   . Umbilical hernia without mention of obstruction or gangrene    2014  . Unspecified hypothyroidism   . Vitamin D deficiency     Assessment/Plan: 1 Day Post-Op Procedure(s) (LRB): TOTAL KNEE ARTHROPLASTY (Left) Active Problems:   Status post total left knee replacement  Estimated body mass index is 28.9 kg/m as calculated from the following:   Height as of this encounter: 5' (1.524 m).   Weight as of this encounter: 67.1 kg. Advance diet Up with therapy D/C IV fluids  Patient's anticipated LOS is less than 2 midnights, meeting these requirements: - Lives within 1 hour of care - Has a competent adult at home to recover with post-op recover - NO history of  - Chronic pain requiring opiods  - Diabetes  - Coronary Artery Disease  - Heart failure  -  Heart attack  - Stroke  - DVT/VTE  - Cardiac arrhythmia  - Respiratory Failure/COPD  - Renal failure  - Anemia  - Advanced Liver disease  DVT Prophylaxis - Xarelto Weight bearing as tolerated. D/C O2 and pulse ox and try on room air.  Plan is to go Home after hospital stay. Scheduled for OPPT at [facility] in [place]. Sodium at 127 this morning, but was low pre-operatively as well. I instructed her to follow up with her PCP after her hospital stay for recheck. History of CKD, with Creatinine of 1.16 today. Follow up in the office in 2 weeks.   Griffith Citron, PA-C Orthopedic Surgery 02/12/2019, 8:14 AM

## 2019-02-12 NOTE — Evaluation (Signed)
Physical Therapy Evaluation Patient Details Name: Kathleen Paul MRN: [id number] DOB: [birth year] Today's Date: 02/12/2019   History of Present Illness  83 yo female s/p L TKA 02/12/19  Clinical Impression  On eval, pt required Mod assist to stand and Min assist to ambulate ~40 feet with a RW. Moderate pain with activity. Pt fatigues fairly easily. Followed with recliner and transported pt back to room. Will plan to have a 2nd session prior to d/c later today.     Follow Up Recommendations Follow surgeon's recommendation for DC plan and follow-up therapies    Equipment Recommendations  None recommended by PT    Recommendations for Other Services       Precautions / Restrictions Precautions Precautions: Fall Restrictions Weight Bearing Restrictions: No Other Position/Activity Restrictions: WBAT      Mobility  Bed Mobility               General bed mobility comments: oob in recliner  Transfers Overall transfer level: Needs assistance Equipment used: Rolling walker (2 wheeled) Transfers: Sit to/from Stand Sit to Stand: Mod assist         General transfer comment: Assist to rise, stabilize, control descent. VCs safety, technique, hand/LE placement  Ambulation/Gait Ambulation/Gait assistance: Min assist Gait Distance (Feet): 40 Feet Assistive device: Rolling walker (2 wheeled) Gait Pattern/deviations: Step-to pattern;Trunk flexed;Antalgic     General Gait Details: VCs safety, technique, sequence. Assist to stabilize pt throughout distance. Slow gait speed.  Stairs            Wheelchair Mobility    Modified Rankin (Stroke Patients Only)       Balance Overall balance assessment: Needs assistance         Standing balance support: Bilateral upper extremity supported Standing balance-Leahy Scale: Poor                               Pertinent Vitals/Pain Pain Assessment: 0-10 Pain Score: 6  Pain Location: L knee Pain Descriptors /  Indicators: Aching;Sore Pain Intervention(s): Limited activity within patient's tolerance;Monitored during session;Repositioned;Ice applied    Home Living Family/patient expects to be discharged to:: Private residence Living Arrangements: Spouse/significant other;Children   Type of Home: House Home Access: Stairs to enter Entrance Stairs-Rails: Chemical engineer of Steps: 2 Home Layout: One level Home Equipment: Environmental consultant - 2 wheels;Cane - single point      Prior Function Level of Independence: Independent               Hand Dominance        Extremity/Trunk Assessment   Upper Extremity Assessment Upper Extremity Assessment: Generalized weakness("bad shoulders")    Lower Extremity Assessment Lower Extremity Assessment: Generalized weakness(post op weakness 2* TKA)    Cervical / Trunk Assessment Cervical / Trunk Assessment: Normal  Communication   Communication: No difficulties  Cognition Arousal/Alertness: Awake/alert Behavior During Therapy: WFL for tasks assessed/performed Overall Cognitive Status: Within Functional Limits for tasks assessed                                        General Comments      Exercises     Assessment/Plan    PT Assessment Patient needs continued PT services  PT Problem List Decreased strength;Decreased mobility;Decreased range of motion;Decreased balance;Decreased activity tolerance;Pain       PT Treatment Interventions DME instruction;Gait  training;Therapeutic activities;Therapeutic exercise;Patient/family education;Functional mobility training;Balance training;Stair training    PT Goals (Current goals can be found in the Care Plan section)  Acute Rehab PT Goals Patient Stated Goal: less pain. regain mobility/independence PT Goal Formulation: With patient Time For Goal Achievement: 02/26/19 Potential to Achieve Goals: Good    Frequency 7X/week   Barriers to discharge         Co-evaluation               AM-PAC PT "6 Clicks" Mobility  Outcome Measure Help needed turning from your back to your side while in a flat bed without using bedrails?: A Little Help needed moving from lying on your back to sitting on the side of a flat bed without using bedrails?: A Little Help needed moving to and from a bed to a chair (including a wheelchair)?: A Little Help needed standing up from a chair using your arms (e.g., wheelchair or bedside chair)?: A Little Help needed to walk in hospital room?: A Little Help needed climbing 3-5 steps with a railing? : A Little 6 Click Score: 18    End of Session Equipment Utilized During Treatment: Gait belt Activity Tolerance: Patient tolerated treatment well Patient left: in chair;with call bell/phone within reach;with family/visitor present   PT Visit Diagnosis: Other abnormalities of gait and mobility (R26.89)    Time: 1884-1660 PT Time Calculation (min) (ACUTE ONLY): 29 min   Charges:   PT Evaluation $PT Eval Low Complexity: 1 Low PT Treatments $Gait Training: 8-22 mins         Weston Anna, PT Acute Rehabilitation Services Pager: [phone] Office: [phone]

## 2019-02-12 NOTE — Progress Notes (Signed)
Physical Therapy Treatment Patient Details Name: Kathleen Paul MRN: [id number] DOB: [birth year] Today's Date: 02/12/2019    History of Present Illness 83 yo female s/p L TKA 02/12/19    PT Comments    2nd session to practice stair negotiation. Issued HEP for pt to perform until she begins OP PT. All education completed. Okay to d/c from PT standpoint-RN aware.     Follow Up Recommendations  Follow surgeon's recommendation for DC plan and follow-up therapies     Equipment Recommendations  None recommended by PT    Recommendations for Other Services       Precautions / Restrictions Precautions Precautions: Fall;Knee Restrictions Weight Bearing Restrictions: No Other Position/Activity Restrictions: WBAT    Mobility  Bed Mobility               General bed mobility comments: oob in recliner  Transfers Overall transfer level: Needs assistance Equipment used: Rolling walker (2 wheeled) Transfers: Sit to/from Stand Sit to Stand: Min assist         General transfer comment: Assist to rise, stabilize, control descent. VCs safety, technique, hand/LE placement. Easier to rise from higher surface  Ambulation/Gait Ambulation/Gait assistance: Min guard Gait Distance (Feet): 40 Feet Assistive device: Rolling walker (2 wheeled) Gait Pattern/deviations: Step-to pattern;Trunk flexed;Antalgic     General Gait Details: VCs safety, technique, sequence. Close guard for safety. Slow gait speed.   Stairs Stairs: Yes Stairs assistance: Min assist Stair Management: Forwards;One rail Left;With cane;Step to pattern Number of Stairs: 2 General stair comments: up and over portable steps. VCs safety, technique, sequence. Pt used 1 rail and 1 cane. daughter present to observe and assist as needed.   Wheelchair Mobility    Modified Rankin (Stroke Patients Only)       Balance Overall balance assessment: Needs assistance         Standing balance support: Bilateral upper  extremity supported Standing balance-Leahy Scale: Poor                              Cognition Arousal/Alertness: Awake/alert Behavior During Therapy: WFL for tasks assessed/performed Overall Cognitive Status: Within Functional Limits for tasks assessed                                        Exercises Total Joint Exercises Ankle Circles/Pumps: AROM;Both;10 reps;Seated Quad Sets: AROM;Both;10 reps;Seated Straight Leg Raises: AROM;Left;10 reps;Supine Knee Flexion: AROM;Left;10 reps;Seated Goniometric ROM: ~10-75 degrees    General Comments        Pertinent Vitals/Pain Pain Assessment: 0-10 Pain Score: 6  Pain Location: L knee Pain Descriptors / Indicators: Aching;Sore Pain Intervention(s): Limited activity within patient's tolerance    Home Living Family/patient expects to be discharged to:: Private residence Living Arrangements: Spouse/significant other;Children   Type of Home: House Home Access: Stairs to enter Entrance Stairs-Rails: Left;Right Home Layout: One level Home Equipment: Environmental consultant - 2 wheels;Cane - single point      Prior Function Level of Independence: Independent          PT Goals (current goals can now be found in the care plan section) Acute Rehab PT Goals Patient Stated Goal: less pain. regain mobility/independence PT Goal Formulation: With patient Time For Goal Achievement: 02/26/19 Potential to Achieve Goals: Good Progress towards PT goals: Progressing toward goals    Frequency    7X/week  PT Plan Current plan remains appropriate    Co-evaluation              AM-PAC PT "6 Clicks" Mobility   Outcome Measure  Help needed turning from your back to your side while in a flat bed without using bedrails?: A Little Help needed moving from lying on your back to sitting on the side of a flat bed without using bedrails?: A Little Help needed moving to and from a bed to a chair (including a wheelchair)?: A  Little Help needed standing up from a chair using your arms (e.g., wheelchair or bedside chair)?: A Little Help needed to walk in hospital room?: A Little Help needed climbing 3-5 steps with a railing? : A Little 6 Click Score: 18    End of Session Equipment Utilized During Treatment: Gait belt Activity Tolerance: Patient tolerated treatment well Patient left: in chair;with call bell/phone within reach;with family/visitor present   PT Visit Diagnosis: Other abnormalities of gait and mobility (R26.89)     Time: 1771-1657 PT Time Calculation (min) (ACUTE ONLY): 14 min  Charges:  $Gait Training: 8-22 mins              Weston Anna, [place] Pager: [phone] Office: [phone]

## 2019-02-14 DIAGNOSIS — M25662 Stiffness of left knee, not elsewhere classified: Secondary | ICD-10-CM | POA: Diagnosis not present

## 2019-02-14 DIAGNOSIS — R262 Difficulty in walking, not elsewhere classified: Secondary | ICD-10-CM | POA: Diagnosis not present

## 2019-02-16 NOTE — Discharge Summary (Signed)
Physician Discharge Summary   Patient ID: Kathleen Paul MRN: [id number] DOB/AGE: 1936-06-12 83 y.o.  Admit date: 02/11/2019 Discharge date: 02/12/2019  Primary Diagnosis: Left knee osteoarthritis  Admission Diagnoses:  Past Medical History:  Diagnosis Date  . Anxiety state, unspecified   . Arthritis   . Atherosclerosis   . Diverticulosis   . Esophageal stricture   . Essential hypertension, benign   . Gastric ulcer   . Hiatal hernia   . History of DVT (deep vein thrombosis)    legs  . IDA (iron deficiency anemia)   . Inguinal hernia without mention of obstruction or gangrene, unilateral or unspecified, (not specified as recurrent)    2014  . Internal hemorrhoids   . Mild mitral regurgitation    a. 04/2010 Echo: nl EF, mild diast dysfxn, trace PR, mild TR, mild-mod MR.  . Non-obstructive CAD    a. 04/2010 Myoview: EF 77%, fixed apical/inferior defect;  b. 04/2010 Cath: LAD 39m, LCX/RCA minor irregs, EF 60%;  c. 04/2011 Myoview: low risk.  . Pure hypercholesterolemia   . Scoliosis   . UI (urinary incontinence)   . Umbilical hernia without mention of obstruction or gangrene    2014  . Unspecified hypothyroidism   . Vitamin D deficiency    Discharge Diagnoses:   Active Problems:   Status post total left knee replacement  Estimated body mass index is 28.9 kg/m as calculated from the following:   Height as of this encounter: 5' (1.524 m).   Weight as of this encounter: 67.1 kg.  Procedure:  Procedure(s) (LRB): TOTAL KNEE ARTHROPLASTY (Left)   Consults: None  HPI: Kathleen Paul is a 83 y.o. female patient of   mine.  The patient had been seen, evaluated, and treated for months conservatively in the   office with medication, activity modification, and injections.  The patient had   radiographic changes of bone-on-bone arthritis with endplate sclerosis and osteophytes noted.  Based on the radiographic changes and failed conservative measures, the patient   decided to  proceed with definitive treatment, total knee replacement.  Risks of infection, DVT, component failure, need for revision surgery, neurovascular injury were reviewed in the office setting.  The postop course was reviewed stressing the efforts to maximize post-operative satisfaction and function.  Consent was obtained for benefit of pain   relief.   Laboratory Data: Admission on 02/11/2019, Discharged on 02/12/2019  Component Date Value Ref Range Status  . Sodium 02/11/2019 129* 135 - 145 mmol/L Final  . Potassium 02/11/2019 4.0  3.5 - 5.1 mmol/L Final  . Chloride 02/11/2019 92* 98 - 111 mmol/L Final  . CO2 02/11/2019 27  22 - 32 mmol/L Final  . Glucose, Bld 02/11/2019 76  70 - 99 mg/dL Final  . BUN 02/11/2019 28* 8 - 23 mg/dL Final  . Creatinine, Ser 02/11/2019 1.08* 0.44 - 1.00 mg/dL Final  . Calcium 02/11/2019 8.7* 8.9 - 10.3 mg/dL Final  . GFR calc non Af Amer 02/11/2019 48* >60 mL/min Final  . GFR calc Af Amer 02/11/2019 56* >60 mL/min Final  . Anion gap 02/11/2019 10  5 - 15 Final   Performed at [facility], [place] [address] [place], [place] [address]  . WBC 02/12/2019 6.7  4.0 - 10.5 K/uL Final  . RBC 02/12/2019 2.60* 3.87 - 5.11 MIL/uL Final  . Hemoglobin 02/12/2019 8.1* 12.0 - 15.0 g/dL Final  . HCT 02/12/2019 25.3* 36.0 - 46.0 % Final  . MCV 02/12/2019 97.3  80.0 - 100.0 fL Final  . MCH 02/12/2019 31.2  26.0 - 34.0 pg Final  . MCHC 02/12/2019 32.0  30.0 - 36.0 g/dL Final  . RDW 02/12/2019 13.0  11.5 - 15.5 % Final  . Platelets 02/12/2019 235  150 - 400 K/uL Final  . nRBC 02/12/2019 0.0  0.0 - 0.2 % Final   Performed at [facility], [place] [address] [place], [place] [address]  . Sodium 02/12/2019 127* 135 - 145 mmol/L Final  . Potassium 02/12/2019 4.6  3.5 - 5.1 mmol/L Final  . Chloride 02/12/2019 94* 98 - 111 mmol/L Final  . CO2 02/12/2019 25  22 - 32 mmol/L Final  . Glucose, Bld 02/12/2019 193* 70 - 99 mg/dL Final  . BUN 02/12/2019 27*  8 - 23 mg/dL Final  . Creatinine, Ser 02/12/2019 1.16* 0.44 - 1.00 mg/dL Final  . Calcium 02/12/2019 8.0* 8.9 - 10.3 mg/dL Final  . GFR calc non Af Amer 02/12/2019 44* >60 mL/min Final  . GFR calc Af Amer 02/12/2019 51* >60 mL/min Final  . Anion gap 02/12/2019 8  5 - 15 Final   Performed at [facility], [place] [address] [place], [place] [address]  Hospital Outpatient Visit on 02/07/2019  Component Date Value Ref Range Status  . SARS Coronavirus 2 02/07/2019 NEGATIVE  NEGATIVE Final   Comment: (NOTE) SARS-CoV-2 target nucleic acids are NOT DETECTED. The SARS-CoV-2 RNA is generally detectable in upper and lower respiratory specimens during the acute phase of infection. Negative results do not preclude SARS-CoV-2 infection, do not rule out co-infections with other pathogens, and should not be used as the sole basis for treatment or other patient management decisions. Negative results must be combined with clinical observations, patient history, and epidemiological information. The expected result is Negative. Fact Sheet for Patients: SugarRoll.be Fact Sheet for Healthcare Providers: https://www.woods-mathews.com/ This test is not yet approved or cleared by the [place] FDA and  has been authorized for detection and/or diagnosis of SARS-CoV-2 by FDA under an Emergency Use Authorization (EUA). This EUA will remain  in effect (meaning this test can be used) for the duration of the COVID-19 declaration under Section 56                          4(b)(1) of the Act, 21 U.S.C. section 360bbb-3(b)(1), unless the authorization is terminated or revoked sooner. Performed at [facility], [place] [address] [place], [place] [address]   Hospital Outpatient Visit on 02/07/2019  Component Date Value Ref Range Status  . ABO/RH(D) 02/07/2019 B POS   Final  . Antibody Screen 02/07/2019 NEG   Final  . Sample Expiration 02/07/2019  02/14/2019,2359   Final  . Extend sample reason 02/07/2019    Final                   Value:NO TRANSFUSIONS OR PREGNANCY IN THE PAST 3 MONTHS Performed at [facility], [place] [address] [place], [place] [address]   . WBC 02/07/2019 5.1  4.0 - 10.5 K/uL Final  . RBC 02/07/2019 3.36* 3.87 - 5.11 MIL/uL Final  . Hemoglobin 02/07/2019 10.5* 12.0 - 15.0 g/dL Final  . HCT 02/07/2019 32.4* 36.0 - 46.0 % Final  . MCV 02/07/2019 96.4  80.0 - 100.0 fL Final  . MCH 02/07/2019 31.3  26.0 - 34.0 pg Final  . MCHC 02/07/2019 32.4  30.0 - 36.0 g/dL Final  . RDW  02/07/2019 13.3  11.5 - 15.5 % Final  . Platelets 02/07/2019 239  150 - 400 K/uL Final  . nRBC 02/07/2019 0.0  0.0 - 0.2 % Final   Performed at [facility], [place] [address] [place], [place] [address]  . Sodium 02/07/2019 126* 135 - 145 mmol/L Final  . Potassium 02/07/2019 4.3  3.5 - 5.1 mmol/L Final  . Chloride 02/07/2019 89* 98 - 111 mmol/L Final  . CO2 02/07/2019 26  22 - 32 mmol/L Final  . Glucose, Bld 02/07/2019 112* 70 - 99 mg/dL Final  . BUN 02/07/2019 24* 8 - 23 mg/dL Final  . Creatinine, Ser 02/07/2019 1.21* 0.44 - 1.00 mg/dL Final  . Calcium 02/07/2019 8.6* 8.9 - 10.3 mg/dL Final  . GFR calc non Af Amer 02/07/2019 42* >60 mL/min Final  . GFR calc Af Amer 02/07/2019 49* >60 mL/min Final  . Anion gap 02/07/2019 11  5 - 15 Final   Performed at [facility], [place] [address] [place], [place] [address]  . MRSA, PCR 02/07/2019 NEGATIVE  NEGATIVE Final  . Staphylococcus aureus 02/07/2019 NEGATIVE  NEGATIVE Final   Comment: (NOTE) The Xpert SA Assay (FDA approved for NASAL specimens in patients 12 years of age and older), is one component of a comprehensive surveillance program. It is not intended to diagnose infection nor to guide or monitor treatment. Performed at [facility], [place] [address], [place], [place] [address]   . ABO/RH(D) 02/07/2019    Final                    Value:B POS Performed at [facility], [place] [address], [place], [place] [address]   Lab on 01/28/2019  Component Date Value Ref Range Status  . INR 01/28/2019 1.0  0.8 - 1.0 ratio Final  . Prothrombin Time 01/28/2019 11.7  9.6 - 13.1 sec Final  . Hgb A1c MFr Bld 01/28/2019 5.7  4.6 - 6.5 % Final   Glycemic Control Guidelines for People with Diabetes:Non Diabetic:  <6%Goal of Therapy: <7%Additional Action Suggested:  >8%   . Sodium 01/28/2019 134* 135 - 145 mEq/L Final  . Potassium 01/28/2019 4.8  3.5 - 5.1 mEq/L Final  . Chloride 01/28/2019 100  96 - 112 mEq/L Final  . CO2 01/28/2019 24  19 - 32 mEq/L Final  . Glucose, Bld 01/28/2019 87  70 - 99 mg/dL Final  . BUN 01/28/2019 30* 6 - 23 mg/dL Final  . Creatinine, Ser 01/28/2019 1.22* 0.40 - 1.20 mg/dL Final  . Total Bilirubin 01/28/2019 0.7  0.2 - 1.2 mg/dL Final  . Alkaline Phosphatase 01/28/2019 59  39 - 117 U/L Final  . AST 01/28/2019 16  0 - 37 U/L Final  . ALT 01/28/2019 11  0 - 35 U/L Final  . Total Protein 01/28/2019 5.9* 6.0 - 8.3 g/dL Final  . Albumin 01/28/2019 4.0  3.5 - 5.2 g/dL Final  . Calcium 01/28/2019 9.0  8.4 - 10.5 mg/dL Final  . GFR 01/28/2019 42.28* >60.00 mL/min Final  . WBC 01/28/2019 5.5  4.0 - 10.5 K/uL Final  . RBC 01/28/2019 3.12* 3.87 - 5.11 Mil/uL Final  . Platelets 01/28/2019 223.0  150.0 - 400.0 K/uL Final  . Hemoglobin 01/28/2019 10.1* 12.0 - 15.0 g/dL Final  . HCT 01/28/2019 30.3* 36.0 - 46.0 % Final  . MCV 01/28/2019 97.2  78.0 - 100.0 fl Final  . MCHC 01/28/2019 33.3  30.0 - 36.0 g/dL Final  .  RDW 01/28/2019 13.9  11.5 - 15.5 % Final  . Color, UA 01/29/2019 Yellow   Final  . Clarity, UA 01/29/2019 Clear   Final  . Glucose, UA 01/29/2019 Negative  Negative Final  . Bilirubin, UA 01/29/2019 Negative   Final  . Ketones, UA 01/29/2019 Negative   Final  . Spec Grav, UA 01/29/2019 1.015  1.010 - 1.025 Final  . Blood, UA 01/29/2019 Negative   Final  . pH, UA 01/29/2019  7.0  5.0 - 8.0 Final  . Protein, UA 01/29/2019 Negative  Negative Final  . Urobilinogen, UA 01/29/2019 0.2  0.2 or 1.0 E.U./dL Final  . Nitrite, UA 01/29/2019 Negative   Final  . Leukocytes, UA 01/29/2019 Negative  Negative Final     X-Rays:No results found.  EKG: Orders placed or performed in visit on 01/30/19  . EKG 12-Lead     Hospital Course: Kathleen Paul is a 83 y.o. who was admitted to [facility]. They were brought to the operating room on 02/11/2019 and underwent Procedure(s): TOTAL KNEE ARTHROPLASTY.  Patient tolerated the procedure well and was later transferred to the recovery room and then to the orthopaedic floor for postoperative care. They were given PO and IV analgesics for pain control following their surgery. They were given 24 hours of postoperative antibiotics of  Anti-infectives (From admission, onward)   Start     Dose/Rate Route Frequency Ordered Stop   02/11/19 1800  ceFAZolin (ANCEF) IVPB 2g/100 mL premix     2 g 200 mL/hr over 30 Minutes Intravenous Every 6 hours 02/11/19 1523 02/12/19 0049   02/11/19 1000  ceFAZolin (ANCEF) IVPB 2g/100 mL premix     2 g 200 mL/hr over 30 Minutes Intravenous On call to O.R. 02/11/19 0160 02/11/19 1243     and started on DVT prophylaxis in the form of Xarelto.   PT and OT were ordered for total joint protocol. Discharge planning consulted to help with postop disposition and equipment needs.  Patient had a good night on the evening of surgery. They started to get up OOB with therapy on POD #1. Pt was seen during rounds and was ready to go home pending progress with therapy.  She worked with therapy on POD #1 and was meeting her goals. Pt was discharged to home later that day in stable condition.  Diet: Regular diet Activity: WBAT Follow-up: in 2 weeks Disposition: Home Discharged Condition: good   Discharge Instructions    Call MD / Call 911   Complete by: As directed    If you experience chest pain or shortness  of breath, CALL 911 and be transported to the hospital emergency room.  If you develope a fever above 101 F, pus (white drainage) or increased drainage or redness at the wound, or calf pain, call your surgeon's office.   Change dressing   Complete by: As directed    Maintain surgical dressing until follow up in the clinic. If the edges start to pull up, may reinforce with tape. If the dressing is no longer working, may remove and cover with gauze and tape, but must keep the area dry and clean.  Call with any questions or concerns.   Constipation Prevention   Complete by: As directed    Drink plenty of fluids.  Prune juice may be helpful.  You may use a stool softener, such as Colace (over the counter) 100 mg twice a day.  Use MiraLax (over the counter) for constipation as needed.  Diet - low sodium heart healthy   Complete by: As directed    Discharge instructions   Complete by: As directed    Maintain surgical dressing until follow up in the clinic. If the edges start to pull up, may reinforce with tape. If the dressing is no longer working, may remove and cover with gauze and tape, but must keep the area dry and clean.  Follow [facility] in 2 weeks at [facility]. Call with any questions or concerns.   Increase activity slowly as tolerated   Complete by: As directed    Weight bearing as tolerated with assist device (walker, cane, etc) as directed, use it as long as suggested by your surgeon or therapist, typically at least 4-6 weeks.   TED hose   Complete by: As directed    Use stockings (TED hose) for 2 weeks on both leg(s).  You may remove them at night for sleeping.     Allergies as of 02/12/2019      Reactions   Codeine Nausea Only      Medication List    STOP taking these medications   aspirin EC 81 MG tablet Replaced by: aspirin 81 MG chewable tablet     TAKE these medications   aspirin 81 MG chewable tablet Commonly known as: Aspirin Childrens Chew 1 tablet (81 mg  total) by mouth 2 (two) times daily. Start the day after finishing the Xarelto. Take for 4 weeks, then resume regular dose. Replaces: aspirin EC 81 MG tablet   CALCIUM 600+D3 PO Take 1 tablet by mouth 2 (two) times a day.   docusate sodium 100 MG capsule Commonly known as: Colace Take 1 capsule (100 mg total) by mouth 2 (two) times daily.   enalapril-hydrochlorothiazide 10-25 MG tablet Commonly known as: VASERETIC Take 1 tablet by mouth daily.   ferrous sulfate 325 (65 FE) MG tablet Take 325 mg by mouth daily with breakfast. What changed: Another medication with the same name was added. Make sure you understand how and when to take each.   ferrous sulfate 325 (65 FE) MG tablet Commonly known as: FerrouSul Take 1 tablet (325 mg total) by mouth 3 (three) times daily with meals for 14 days. What changed: You were already taking a medication with the same name, and this prescription was added. Make sure you understand how and when to take each.   HYDROcodone-acetaminophen 7.5-325 MG tablet Commonly known as: Norco Take 1-2 tablets by mouth every 4 (four) hours as needed for moderate pain.   levothyroxine 88 MCG tablet Commonly known as: SYNTHROID Take 88 mcg by mouth daily before breakfast.   methocarbamol 500 MG tablet Commonly known as: Robaxin Take 1 tablet (500 mg total) by mouth every 6 (six) hours as needed for muscle spasms.   metoprolol tartrate 25 MG tablet Commonly known as: LOPRESSOR TAKE 1 TABLET BY MOUTH EVERY EVENING What changed:   how much to take  how to take this  when to take this  additional instructions   MULTIVITAMIN PO Take 1 tablet by mouth daily.   omeprazole 40 MG capsule Commonly known as: PRILOSEC Take 1 capsule (40 mg total) by mouth 2 (two) times daily before a meal. What changed: when to take this   OVER THE COUNTER MEDICATION Apply 1 application topically 2 (two) times daily as needed (for pain). OxyRub Cream   polyethylene glycol  17 g packet Commonly known as: MIRALAX / GLYCOLAX Take 17 g by mouth 2 (two) times daily.  rivaroxaban 10 MG Tabs tablet Commonly known as: Xarelto Take 1 tablet (10 mg total) by mouth daily.   simvastatin 20 MG tablet Commonly known as: ZOCOR Take 1 tablet by mouth once daily for cholesterol. What changed:   how much to take  how to take this  when to take this  additional instructions            Discharge Care Instructions  (From admission, onward)         Start     Ordered   02/12/19 0000  Change dressing    Comments: Maintain surgical dressing until follow up in the clinic. If the edges start to pull up, may reinforce with tape. If the dressing is no longer working, may remove and cover with gauze and tape, but must keep the area dry and clean.  Call with any questions or concerns.   02/12/19 8657         Follow-up Information    Paralee Cancel, MD. Schedule an appointment as soon as possible for a visit in 2 weeks.   Specialty: Orthopedic Surgery Contact information: [address] [place] [address] [phone]           Signed: Griffith Citron, PA-C Orthopedic Surgery 02/16/2019, 8:03 PM

## 2019-02-17 DIAGNOSIS — M25662 Stiffness of left knee, not elsewhere classified: Secondary | ICD-10-CM | POA: Diagnosis not present

## 2019-02-17 DIAGNOSIS — R262 Difficulty in walking, not elsewhere classified: Secondary | ICD-10-CM | POA: Diagnosis not present

## 2019-02-19 ENCOUNTER — Other Ambulatory Visit: Payer: Self-pay | Admitting: Gastroenterology

## 2019-02-20 DIAGNOSIS — R262 Difficulty in walking, not elsewhere classified: Secondary | ICD-10-CM | POA: Diagnosis not present

## 2019-02-20 DIAGNOSIS — M25662 Stiffness of left knee, not elsewhere classified: Secondary | ICD-10-CM | POA: Diagnosis not present

## 2019-02-24 DIAGNOSIS — M25662 Stiffness of left knee, not elsewhere classified: Secondary | ICD-10-CM | POA: Diagnosis not present

## 2019-02-24 DIAGNOSIS — R262 Difficulty in walking, not elsewhere classified: Secondary | ICD-10-CM | POA: Diagnosis not present

## 2019-02-28 DIAGNOSIS — M25662 Stiffness of left knee, not elsewhere classified: Secondary | ICD-10-CM | POA: Diagnosis not present

## 2019-02-28 DIAGNOSIS — R262 Difficulty in walking, not elsewhere classified: Secondary | ICD-10-CM | POA: Diagnosis not present

## 2019-03-03 DIAGNOSIS — R262 Difficulty in walking, not elsewhere classified: Secondary | ICD-10-CM | POA: Diagnosis not present

## 2019-03-03 DIAGNOSIS — M25662 Stiffness of left knee, not elsewhere classified: Secondary | ICD-10-CM | POA: Diagnosis not present

## 2019-03-06 DIAGNOSIS — R262 Difficulty in walking, not elsewhere classified: Secondary | ICD-10-CM | POA: Diagnosis not present

## 2019-03-06 DIAGNOSIS — M25662 Stiffness of left knee, not elsewhere classified: Secondary | ICD-10-CM | POA: Diagnosis not present

## 2019-03-13 DIAGNOSIS — R262 Difficulty in walking, not elsewhere classified: Secondary | ICD-10-CM | POA: Diagnosis not present

## 2019-03-13 DIAGNOSIS — M25662 Stiffness of left knee, not elsewhere classified: Secondary | ICD-10-CM | POA: Diagnosis not present

## 2019-03-18 DIAGNOSIS — R262 Difficulty in walking, not elsewhere classified: Secondary | ICD-10-CM | POA: Diagnosis not present

## 2019-03-18 DIAGNOSIS — M25662 Stiffness of left knee, not elsewhere classified: Secondary | ICD-10-CM | POA: Diagnosis not present

## 2019-03-21 DIAGNOSIS — M25662 Stiffness of left knee, not elsewhere classified: Secondary | ICD-10-CM | POA: Diagnosis not present

## 2019-03-21 DIAGNOSIS — R262 Difficulty in walking, not elsewhere classified: Secondary | ICD-10-CM | POA: Diagnosis not present

## 2019-03-24 DIAGNOSIS — M25662 Stiffness of left knee, not elsewhere classified: Secondary | ICD-10-CM | POA: Diagnosis not present

## 2019-03-24 DIAGNOSIS — R262 Difficulty in walking, not elsewhere classified: Secondary | ICD-10-CM | POA: Diagnosis not present

## 2019-03-27 DIAGNOSIS — R262 Difficulty in walking, not elsewhere classified: Secondary | ICD-10-CM | POA: Diagnosis not present

## 2019-03-27 DIAGNOSIS — M25662 Stiffness of left knee, not elsewhere classified: Secondary | ICD-10-CM | POA: Diagnosis not present

## 2019-03-31 DIAGNOSIS — M25662 Stiffness of left knee, not elsewhere classified: Secondary | ICD-10-CM | POA: Diagnosis not present

## 2019-03-31 DIAGNOSIS — R262 Difficulty in walking, not elsewhere classified: Secondary | ICD-10-CM | POA: Diagnosis not present

## 2019-04-10 DIAGNOSIS — Z471 Aftercare following joint replacement surgery: Secondary | ICD-10-CM | POA: Diagnosis not present

## 2019-04-10 DIAGNOSIS — Z96652 Presence of left artificial knee joint: Secondary | ICD-10-CM | POA: Diagnosis not present

## 2019-04-17 DIAGNOSIS — M25511 Pain in right shoulder: Secondary | ICD-10-CM | POA: Insufficient documentation

## 2019-04-18 DIAGNOSIS — M25811 Other specified joint disorders, right shoulder: Secondary | ICD-10-CM | POA: Diagnosis not present

## 2019-04-18 DIAGNOSIS — M25812 Other specified joint disorders, left shoulder: Secondary | ICD-10-CM | POA: Diagnosis not present

## 2019-04-18 DIAGNOSIS — M25511 Pain in right shoulder: Secondary | ICD-10-CM | POA: Diagnosis not present

## 2019-04-18 DIAGNOSIS — M12812 Other specific arthropathies, not elsewhere classified, left shoulder: Secondary | ICD-10-CM | POA: Insufficient documentation

## 2019-04-18 DIAGNOSIS — M25512 Pain in left shoulder: Secondary | ICD-10-CM | POA: Diagnosis not present

## 2019-04-18 DIAGNOSIS — M12811 Other specific arthropathies, not elsewhere classified, right shoulder: Secondary | ICD-10-CM | POA: Insufficient documentation

## 2019-05-16 ENCOUNTER — Other Ambulatory Visit: Payer: Self-pay | Admitting: Primary Care

## 2019-05-16 DIAGNOSIS — M25561 Pain in right knee: Secondary | ICD-10-CM | POA: Diagnosis not present

## 2019-05-16 DIAGNOSIS — M1711 Unilateral primary osteoarthritis, right knee: Secondary | ICD-10-CM | POA: Diagnosis not present

## 2019-05-16 DIAGNOSIS — Z471 Aftercare following joint replacement surgery: Secondary | ICD-10-CM | POA: Diagnosis not present

## 2019-05-16 DIAGNOSIS — Z96652 Presence of left artificial knee joint: Secondary | ICD-10-CM | POA: Diagnosis not present

## 2019-05-16 DIAGNOSIS — E785 Hyperlipidemia, unspecified: Secondary | ICD-10-CM

## 2019-05-18 DIAGNOSIS — M1711 Unilateral primary osteoarthritis, right knee: Secondary | ICD-10-CM | POA: Insufficient documentation

## 2019-05-30 DIAGNOSIS — M6688 Spontaneous rupture of other tendons, other: Secondary | ICD-10-CM | POA: Diagnosis not present

## 2019-05-30 DIAGNOSIS — Z96652 Presence of left artificial knee joint: Secondary | ICD-10-CM | POA: Diagnosis not present

## 2019-05-30 DIAGNOSIS — M25562 Pain in left knee: Secondary | ICD-10-CM | POA: Diagnosis not present

## 2019-05-30 DIAGNOSIS — S86812D Strain of other muscle(s) and tendon(s) at lower leg level, left leg, subsequent encounter: Secondary | ICD-10-CM | POA: Diagnosis not present

## 2019-05-31 ENCOUNTER — Other Ambulatory Visit (HOSPITAL_COMMUNITY)
Admission: RE | Admit: 2019-05-31 | Discharge: 2019-05-31 | Disposition: A | Discharge status: Home / Self Care | Payer: PPO | Source: Ambulatory Visit | Attending: Orthopedic Surgery | Admitting: Orthopedic Surgery

## 2019-05-31 DIAGNOSIS — M25562 Pain in left knee: Secondary | ICD-10-CM | POA: Diagnosis not present

## 2019-05-31 DIAGNOSIS — Z01812 Encounter for preprocedural laboratory examination: Secondary | ICD-10-CM | POA: Diagnosis not present

## 2019-05-31 DIAGNOSIS — Z20828 Contact with and (suspected) exposure to other viral communicable diseases: Secondary | ICD-10-CM | POA: Insufficient documentation

## 2019-06-01 NOTE — H&P (Signed)
Kathleen Paul is an 83 y.o. female.    Chief Complaint:   Rupture of left patella tendon s/p TKA  Procedure:  ORIF left patella tendon  HPI: Pt is a 83 y.o. female complaining of left knee pain for 1 week.  She is 3.5 months out from a left TKA, preformed on 02/11/2019 per Dr. Alvan Dame.  Pain has continued since the incident 1 week ago.. Patient stated that she had talked Dr. Alvan Dame about doing her right total knee and realized she had not strengthened her left knee enough to do stairs. In attempt to use the stairs leading with the left leg she felt a pop with excruciating pain. Since that time she is unable to fully extend her leg as well as having severe pain with range of motion and weightbearing.  X-rays in the clinic show previous TKA with patella alta.  An MRI was obtained to review the extent of damage to the patella tendon.  Various options are discussed with the patient. Risks, benefits and expectations were discussed with the patient. Patient understand the risks, benefits and expectations and wishes to proceed with surgery.    PCP: [name], NP  D/C Plans:       Home  Post-op Meds:       No Rx given   Tranexamic Acid:      To be given - IV   Decadron:      Is to be given  FYI:      ASA  Tramadol / Dilaudid (codeine allergy)  DME:   Pt already has equipment   PMH: Past Medical History:  Diagnosis Date  . Anxiety state, unspecified   . Arthritis   . Atherosclerosis   . Diverticulosis   . Esophageal stricture   . Essential hypertension, benign   . Gastric ulcer   . Hiatal hernia   . History of DVT (deep vein thrombosis)    legs  . IDA (iron deficiency anemia)   . Inguinal hernia without mention of obstruction or gangrene, unilateral or unspecified, (not specified as recurrent)    2014  . Internal hemorrhoids   . Mild mitral regurgitation    a. 04/2010 Echo: nl EF, mild diast dysfxn, trace PR, mild TR, mild-mod MR.  . Non-obstructive CAD    a. 04/2010  Myoview: EF 77%, fixed apical/inferior defect;  b. 04/2010 Cath: LAD 55m, LCX/RCA minor irregs, EF 60%;  c. 04/2011 Myoview: low risk.  . Pure hypercholesterolemia   . Scoliosis   . UI (urinary incontinence)   . Umbilical hernia without mention of obstruction or gangrene    2014  . Unspecified hypothyroidism   . Vitamin D deficiency     PSH: Past Surgical History:  Procedure Laterality Date  . ABDOMINAL HYSTERECTOMY  2008  . BIOPSY  04/02/2018   Procedure: BIOPSY;  Surgeon: Rush Landmark Telford Nab., MD;  Location: [facility];  Service: Gastroenterology;;  . CARDIAC CATHETERIZATION  2011   [facility]  . COLONOSCOPY  2003   [facility]  . ESOPHAGOGASTRODUODENOSCOPY (EGD) WITH PROPOFOL N/A 04/02/2018   Procedure: ESOPHAGOGASTRODUODENOSCOPY (EGD) WITH PROPOFOL;  Surgeon: Rush Landmark Telford Nab., MD;  Location: [facility];  Service: Gastroenterology;  Laterality: N/A;  . FOREIGN BODY REMOVAL  04/02/2018   Procedure: FOREIGN BODY REMOVAL;  Surgeon: Rush Landmark Telford Nab., MD;  Location: [facility];  Service: Gastroenterology;;  . HERNIA REPAIR Right 09/23/2012   repair RIH  . LAPAROSCOPIC HYSTERECTOMY    . TONSILLECTOMY    .  TOTAL KNEE ARTHROPLASTY Left 02/11/2019   Procedure: TOTAL KNEE ARTHROPLASTY;  Surgeon: Paralee Cancel, MD;  Location: [facility];  Service: Orthopedics;  Laterality: Left;  70 mins    Social History:  reports that she quit smoking about 30 years ago. Her smoking use included cigarettes. She has a 15.00 pack-year smoking history. She has never used smokeless tobacco. She reports that she does not drink alcohol or use drugs.  Allergies:  Allergies  Allergen Reactions  . Codeine Nausea Only    Medications: No current facility-administered medications for this encounter.    Current Outpatient Medications  Medication Sig Dispense Refill  . acetaminophen (TYLENOL) 500 MG tablet Take 500 mg by mouth every 6 (six) hours as needed for moderate pain.    Marland Kitchen aspirin EC 81  MG tablet Take 81 mg by mouth at bedtime.    . Calcium-Magnesium-Vitamin D (CALCIUM 1200+D3 PO) Take 1 tablet by mouth daily.    . Carboxymethylcellul-Glycerin (LUBRICATING EYE DROPS OP) Place 1 drop into both eyes daily as needed (dry eyes).    . enalapril-hydrochlorothiazide (VASERETIC) 10-25 MG tablet Take 1 tablet by mouth daily.    Marland Kitchen levothyroxine (SYNTHROID) 88 MCG tablet TAKE 1 TABLET BY MOUTH EVERY MORNING ON AN EMPTY STOMACH AND WITH A FULL GLASS OF WATER (Patient taking differently: Take 88 mcg by mouth daily before breakfast. ) 90 tablet 0  . metoprolol tartrate (LOPRESSOR) 25 MG tablet TAKE 1 TABLET BY MOUTH EVERY EVENING (Patient taking differently: Take 25 mg by mouth every evening. ) 90 tablet 3  . Multiple Vitamin (MULTIVITAMIN PO) Take 1 tablet by mouth daily.    Marland Kitchen omeprazole (PRILOSEC) 40 MG capsule Take 1 capsule (40 mg total) by mouth 2 (two) times daily before a meal. (Patient taking differently: Take 40 mg by mouth daily. ) 180 capsule 1  . OVER THE COUNTER MEDICATION Apply 1 application topically 2 (two) times daily as needed (for pain). OxyRub Cream     . simvastatin (ZOCOR) 20 MG tablet TAKE 1 TABLET BY MOUTH EVERY DAY FOR CHOLESTEROL (Patient taking differently: Take 20 mg by mouth every evening. ) 90 tablet 0  . docusate sodium (COLACE) 100 MG capsule Take 1 capsule (100 mg total) by mouth 2 (two) times daily. (Patient not taking: Reported on 05/30/2019) 28 capsule 0  . ferrous sulfate (FERROUSUL) 325 (65 FE) MG tablet Take 1 tablet (325 mg total) by mouth 3 (three) times daily with meals for 14 days. (Patient not taking: Reported on 05/30/2019) 42 tablet 0  . HYDROcodone-acetaminophen (NORCO) 7.5-325 MG tablet Take 1-2 tablets by mouth every 4 (four) hours as needed for moderate pain. (Patient not taking: Reported on 05/30/2019) 60 tablet 0  . methocarbamol (ROBAXIN) 500 MG tablet Take 1 tablet (500 mg total) by mouth every 6 (six) hours as needed for muscle spasms. (Patient  not taking: Reported on 05/30/2019) 40 tablet 0  . polyethylene glycol (MIRALAX / GLYCOLAX) 17 g packet Take 17 g by mouth 2 (two) times daily. (Patient not taking: Reported on 05/30/2019) 28 packet 0  . rivaroxaban (XARELTO) 10 MG TABS tablet Take 1 tablet (10 mg total) by mouth daily. (Patient not taking: Reported on 05/30/2019) 14 tablet 0  . traMADol (ULTRAM) 50 MG tablet Take 50 mg by mouth 3 (three) times daily as needed for pain.        Review of Systems  Constitutional: Negative.   HENT: Negative.   Eyes: Negative.   Respiratory: Negative.   Cardiovascular: Negative.  Gastrointestinal: Positive for heartburn.  Genitourinary: Negative.   Musculoskeletal: Positive for joint pain.  Skin: Negative.   Neurological: Negative.   Endo/Heme/Allergies: Negative.   Psychiatric/Behavioral: The patient is nervous/anxious.        Physical Exam  Constitutional: She is oriented to person, place, and time. She appears well-developed.  HENT:  Head: Normocephalic.  Eyes: Pupils are equal, round, and reactive to light.  Neck: Neck supple. No JVD present. No tracheal deviation present. No thyromegaly present.  Cardiovascular: Normal rate, regular rhythm and intact distal pulses.  Respiratory: Effort normal and breath sounds normal. No respiratory distress. She has no wheezes.  GI: Soft. There is no abdominal tenderness. There is no guarding.  Musculoskeletal:     Left knee: She exhibits decreased range of motion, swelling, deformity, abnormal alignment, abnormal patellar mobility and bony tenderness. She exhibits no ecchymosis, no laceration (healed previous incision) and no erythema. Tenderness found. Patellar tendon tenderness noted.  Lymphadenopathy:    She has no cervical adenopathy.  Neurological: She is alert and oriented to person, place, and time.  Skin: Skin is warm and dry.  Psychiatric: She has a normal mood and affect.       Assessment/Plan Assessment: Rupture of left  patella tendon s/p TKA  Plan: Patient will undergo a ORIF left patella tendon on 06/03/2019 per Dr. Alvan Dame at [facility]. Risks benefits and expectations were discussed with the patient. Patient understand risks, benefits and expectations and wishes to proceed.   West Pugh Madilynn Montante   PA-C  06/01/2019, 9:00 PM

## 2019-06-02 ENCOUNTER — Encounter (HOSPITAL_COMMUNITY): Payer: Self-pay | Admitting: *Deleted

## 2019-06-02 ENCOUNTER — Other Ambulatory Visit: Payer: Self-pay

## 2019-06-02 LAB — NOVEL CORONAVIRUS, NAA (HOSP ORDER, SEND-OUT TO REF LAB; TAT 18-24 HRS): SARS-CoV-2, NAA: NOT DETECTED

## 2019-06-02 NOTE — Progress Notes (Signed)
Need orders in epic for 06-03-19 surgery

## 2019-06-02 NOTE — Progress Notes (Signed)
PCP - Alma Friendly np labauer Cardiologist - dr Harrington Challenger, lov Daune Perch np 01-30-19 epic  Chest x-ray - none EKG - 01-30-19 epic Stress Test - none ECHO - none Cardiac Cath - 2011  Sleep Study - none CPAP -   Fasting Blood Sugar - n/a Checks Blood Sugar _____ times a day  Blood Thinner Instructions: Aspirin Instructions: 81 mg aspirine Last Dose:05-31-2019  Anesthesia review: chart to jessica zanetto pa for review  Patient denies shortness of breath, fever, cough and chest pain at PAT appointment   Patient verbalized understanding of instructions that were given to them at the PAT appointment. Patient was also instructed that they will need to review over the PAT instructions again at home before surgery.

## 2019-06-02 NOTE — Progress Notes (Signed)
Anesthesia Chart Review   Case: [id number] Date/Time: 06/03/19 1703   Procedure: PATELLA TENDON REPAIR (Left Knee)   Anesthesia type: Spinal   Pre-op diagnosis: Left patellar tendon rupture status post left total knee   [facility]: [facility]OM 09 / [facility]   Surgeon: Paralee Cancel, MD      DISCUSSION:83 y.o. former smoker (15 pack years, 07/10/88) with h/o nonobstructive CAD, hypothyroidism, DVT, anxiety, HTN, RA, hiatal hernia, anemia, renal insufficiency, left patellar tendon rupture status post left total knee scheduled for above procedure 06/03/2019 with Dr. Paralee Cancel.   S/p left total knee arthroplasty 02/11/2019 with no anesthesia complications noted.    Prior to above procedure she was cleared by cardiology.  Per Daune Perch, NP 01/30/2019, "Based on ACC/AHA guidelines,Kathleen T Kolessarwould be at acceptable risk for the planned procedure without further cardiovascular testing.  It is OK to hold aspirin as needed for surgery."  Anticipate pt can proceed with planned procedure barring acute status change and after evaluation DOS (SDW).  VS: There were no vitals taken for this visit.  PROVIDERS: [name], NP is PCP   Dorris Carnes, MD is Cardiologist   Joni Fears, MD is nephrologist  LABS: SDW (all labs ordered are listed, but only abnormal results are displayed)  Labs Reviewed - No data to display   IMAGES:   EKG: 01/30/2019 Rate 57 bpm Sinus bradycardia Right bundle branch block   CV: Cardiac Cath 05/10/2010 Cardiac cath with mild to moderate disease in LAD.  Patient with small hematoma in R groin and will need to rule out AV fistula.   Echo 04/21/2010 Mildly dilated left ventricle with rest of the chambers of normal size.  Normal LV systolic function Mild diastolic dysfunction.  Trace pulmonary regurgitation.  Mild tricuspid regurgitation.  Normal pulmonary artery pressure.  Mild-moderate mitral regurgitation.  No pericardial effusion.   ETT  04/20/2010 Defect could by due to artifact vs. Ischemia.  Correlate clinically.  Past Medical History:  Diagnosis Date  . Anxiety state, unspecified   . Arthritis   . Atherosclerosis   . Diverticulosis   . Esophageal stricture   . Essential hypertension, benign   . Gastric ulcer   . Hiatal hernia   . History of DVT (deep vein thrombosis) yrs ago   legs  . IDA (iron deficiency anemia)   . Inguinal hernia without mention of obstruction or gangrene, unilateral or unspecified, (not specified as recurrent)    2014  . Internal hemorrhoids   . Mild mitral regurgitation    a. 04/2010 Echo: nl EF, mild diast dysfxn, trace PR, mild TR, mild-mod MR.  . Non-obstructive CAD    a. 04/2010 Myoview: EF 77%, fixed apical/inferior defect;  b. 04/2010 Cath: LAD 63m, LCX/RCA minor irregs, EF 60%;  c. 04/2011 Myoview: low risk.  . Pure hypercholesterolemia   . Rupture of left patellar tendon    05-26-2019  . Scoliosis   . UI (urinary incontinence)   . Umbilical hernia without mention of obstruction or gangrene    2014  . Unspecified hypothyroidism   . Vitamin D deficiency     Past Surgical History:  Procedure Laterality Date  . ABDOMINAL HYSTERECTOMY  2008   complete   . BIOPSY  04/02/2018   Procedure: BIOPSY;  Surgeon: Rush Landmark Telford Nab., MD;  Location: [facility];  Service: Gastroenterology;;  . CARDIAC CATHETERIZATION  2011   [facility]  . COLONOSCOPY  2003   [facility]  . ESOPHAGOGASTRODUODENOSCOPY (EGD) WITH PROPOFOL N/A 04/02/2018  Procedure: ESOPHAGOGASTRODUODENOSCOPY (EGD) WITH PROPOFOL;  Surgeon: Rush Landmark Telford Nab., MD;  Location: [facility];  Service: Gastroenterology;  Laterality: N/A;  . FOREIGN BODY REMOVAL  04/02/2018   Procedure: FOREIGN BODY REMOVAL;  Surgeon: Rush Landmark Telford Nab., MD;  Location: [facility];  Service: Gastroenterology;;  . HERNIA REPAIR Right 09/23/2012   repair RIH  . LAPAROSCOPIC HYSTERECTOMY    . TONSILLECTOMY    . TOTAL KNEE  ARTHROPLASTY Left 02/11/2019   Procedure: TOTAL KNEE ARTHROPLASTY;  Surgeon: Paralee Cancel, MD;  Location: [facility];  Service: Orthopedics;  Laterality: Left;  70 mins    MEDICATIONS: No current facility-administered medications for this encounter.    Marland Kitchen acetaminophen (TYLENOL) 500 MG tablet  . aspirin EC 81 MG tablet  . Calcium-Magnesium-Vitamin D (CALCIUM 1200+D3 PO)  . Carboxymethylcellul-Glycerin (LUBRICATING EYE DROPS OP)  . enalapril-hydrochlorothiazide (VASERETIC) 10-25 MG tablet  . levothyroxine (SYNTHROID) 88 MCG tablet  . metoprolol tartrate (LOPRESSOR) 25 MG tablet  . Multiple Vitamin (MULTIVITAMIN PO)  . omeprazole (PRILOSEC) 40 MG capsule  . OVER THE COUNTER MEDICATION  . simvastatin (ZOCOR) 20 MG tablet  . docusate sodium (COLACE) 100 MG capsule  . ferrous sulfate (FERROUSUL) 325 (65 FE) MG tablet  . HYDROcodone-acetaminophen (NORCO) 7.5-325 MG tablet  . methocarbamol (ROBAXIN) 500 MG tablet  . polyethylene glycol (MIRALAX / GLYCOLAX) 17 g packet  . rivaroxaban (XARELTO) 10 MG TABS tablet  . traMADol (ULTRAM) 50 MG tablet    Maia Plan [facility] Pre-Surgical Testing [phone] 06/02/19  10:50 AM

## 2019-06-02 NOTE — Anesthesia Preprocedure Evaluation (Addendum)
Anesthesia Evaluation  Patient identified by MRN, date of birth, ID band Patient awake    Reviewed: Allergy & Precautions, NPO status , Patient's Chart, lab work & pertinent test results, reviewed documented beta blocker date and time   History of Anesthesia Complications Negative for: history of anesthetic complications  Airway Mallampati: II  TM Distance: >3 FB Neck ROM: Full    Dental  (+) Dental Advisory Given, Partial Upper, Partial Lower   Pulmonary former smoker,    Pulmonary exam normal        Cardiovascular hypertension, Pt. on medications and Pt. on home beta blockers (-) angina+ CAD and + DVT  Normal cardiovascular exam   Prior to TKA in 02/2019, she was cleared by cardiology.  Per Daune Perch, NP 01/30/2019, "Based on ACC/AHA guidelines, BRITLEE [place] would be at acceptable risk for the planned procedure without further cardiovascular testing.  It is OK to hold aspirin as needed for surgery."    Neuro/Psych PSYCHIATRIC DISORDERS Anxiety negative neurological ROS     GI/Hepatic Neg liver ROS, hiatal hernia, PUD, GERD  Medicated and Controlled,  Endo/Other  Hypothyroidism   Renal/GU negative Renal ROS     Musculoskeletal  (+) Arthritis ,   Abdominal   Peds  Hematology  (+) anemia ,   Anesthesia Other Findings Covid negative 11/21   Reproductive/Obstetrics                           Anesthesia Physical Anesthesia Plan  ASA: III  Anesthesia Plan: Spinal   Post-op Pain Management:  Regional for Post-op pain   Induction:   PONV Risk Score and Plan: 2 and Treatment may vary due to age or medical condition and Propofol infusion  Airway Management Planned: Natural Airway and Simple Face Mask  Additional Equipment: None  Intra-op Plan:   Post-operative Plan:   Informed Consent: I have reviewed the patients History and Physical, chart, labs and discussed the procedure  including the risks, benefits and alternatives for the proposed anesthesia with the patient or authorized representative who has indicated his/her understanding and acceptance.       Plan Discussed with: CRNA and Anesthesiologist  Anesthesia Plan Comments:       Anesthesia Quick Evaluation

## 2019-06-03 ENCOUNTER — Other Ambulatory Visit: Payer: Self-pay

## 2019-06-03 ENCOUNTER — Encounter (HOSPITAL_COMMUNITY)
Admission: RE | Disposition: A | Discharge status: Home / Self Care | Payer: Self-pay | Source: Home / Self Care | Attending: Orthopedic Surgery

## 2019-06-03 ENCOUNTER — Ambulatory Visit (HOSPITAL_COMMUNITY): Payer: PPO | Admitting: Physician Assistant

## 2019-06-03 ENCOUNTER — Telehealth (HOSPITAL_COMMUNITY): Payer: Self-pay | Admitting: *Deleted

## 2019-06-03 ENCOUNTER — Encounter (HOSPITAL_COMMUNITY): Payer: Self-pay | Admitting: *Deleted

## 2019-06-03 ENCOUNTER — Observation Stay (HOSPITAL_COMMUNITY)
Admission: RE | Admit: 2019-06-03 | Discharge: 2019-06-04 | Disposition: A | Discharge status: Home / Self Care | Payer: PPO | Attending: Orthopedic Surgery | Admitting: Orthopedic Surgery

## 2019-06-03 DIAGNOSIS — E039 Hypothyroidism, unspecified: Secondary | ICD-10-CM | POA: Insufficient documentation

## 2019-06-03 DIAGNOSIS — I7090 Unspecified atherosclerosis: Secondary | ICD-10-CM | POA: Diagnosis not present

## 2019-06-03 DIAGNOSIS — Z90710 Acquired absence of both cervix and uterus: Secondary | ICD-10-CM | POA: Insufficient documentation

## 2019-06-03 DIAGNOSIS — Z87891 Personal history of nicotine dependence: Secondary | ICD-10-CM | POA: Insufficient documentation

## 2019-06-03 DIAGNOSIS — M66862 Spontaneous rupture of other tendons, left lower leg: Secondary | ICD-10-CM | POA: Diagnosis not present

## 2019-06-03 DIAGNOSIS — Z86718 Personal history of other venous thrombosis and embolism: Secondary | ICD-10-CM | POA: Insufficient documentation

## 2019-06-03 DIAGNOSIS — T8489XA Other specified complication of internal orthopedic prosthetic devices, implants and grafts, initial encounter: Secondary | ICD-10-CM | POA: Diagnosis not present

## 2019-06-03 DIAGNOSIS — D649 Anemia, unspecified: Secondary | ICD-10-CM | POA: Insufficient documentation

## 2019-06-03 DIAGNOSIS — Z79899 Other long term (current) drug therapy: Secondary | ICD-10-CM | POA: Insufficient documentation

## 2019-06-03 DIAGNOSIS — M1711 Unilateral primary osteoarthritis, right knee: Secondary | ICD-10-CM | POA: Insufficient documentation

## 2019-06-03 DIAGNOSIS — I340 Nonrheumatic mitral (valve) insufficiency: Secondary | ICD-10-CM | POA: Insufficient documentation

## 2019-06-03 DIAGNOSIS — X500XXA Overexertion from strenuous movement or load, initial encounter: Secondary | ICD-10-CM | POA: Insufficient documentation

## 2019-06-03 DIAGNOSIS — E663 Overweight: Secondary | ICD-10-CM | POA: Diagnosis not present

## 2019-06-03 DIAGNOSIS — Z6827 Body mass index (BMI) 27.0-27.9, adult: Secondary | ICD-10-CM | POA: Diagnosis not present

## 2019-06-03 DIAGNOSIS — I10 Essential (primary) hypertension: Secondary | ICD-10-CM | POA: Diagnosis not present

## 2019-06-03 DIAGNOSIS — G8918 Other acute postprocedural pain: Secondary | ICD-10-CM | POA: Diagnosis not present

## 2019-06-03 DIAGNOSIS — F419 Anxiety disorder, unspecified: Secondary | ICD-10-CM | POA: Diagnosis not present

## 2019-06-03 DIAGNOSIS — M419 Scoliosis, unspecified: Secondary | ICD-10-CM | POA: Diagnosis not present

## 2019-06-03 DIAGNOSIS — S86812D Strain of other muscle(s) and tendon(s) at lower leg level, left leg, subsequent encounter: Secondary | ICD-10-CM | POA: Diagnosis present

## 2019-06-03 DIAGNOSIS — Z8711 Personal history of peptic ulcer disease: Secondary | ICD-10-CM | POA: Diagnosis not present

## 2019-06-03 DIAGNOSIS — Z7901 Long term (current) use of anticoagulants: Secondary | ICD-10-CM | POA: Diagnosis not present

## 2019-06-03 DIAGNOSIS — Z885 Allergy status to narcotic agent status: Secondary | ICD-10-CM | POA: Diagnosis not present

## 2019-06-03 DIAGNOSIS — Z20828 Contact with and (suspected) exposure to other viral communicable diseases: Secondary | ICD-10-CM | POA: Diagnosis not present

## 2019-06-03 DIAGNOSIS — S86812A Strain of other muscle(s) and tendon(s) at lower leg level, left leg, initial encounter: Secondary | ICD-10-CM

## 2019-06-03 DIAGNOSIS — E7800 Pure hypercholesterolemia, unspecified: Secondary | ICD-10-CM | POA: Diagnosis not present

## 2019-06-03 DIAGNOSIS — Y9389 Activity, other specified: Secondary | ICD-10-CM | POA: Diagnosis not present

## 2019-06-03 DIAGNOSIS — Z7982 Long term (current) use of aspirin: Secondary | ICD-10-CM | POA: Insufficient documentation

## 2019-06-03 DIAGNOSIS — Z96652 Presence of left artificial knee joint: Secondary | ICD-10-CM | POA: Diagnosis not present

## 2019-06-03 DIAGNOSIS — S76192A Other specified injury of left quadriceps muscle, fascia and tendon, initial encounter: Secondary | ICD-10-CM | POA: Diagnosis not present

## 2019-06-03 DIAGNOSIS — M6688 Spontaneous rupture of other tendons, other: Secondary | ICD-10-CM | POA: Diagnosis not present

## 2019-06-03 HISTORY — PX: PATELLAR TENDON REPAIR: SHX737

## 2019-06-03 HISTORY — DX: Strain of other muscle(s) and tendon(s) at lower leg level, left leg, initial encounter: S86.812A

## 2019-06-03 LAB — TYPE AND SCREEN
ABO/RH(D): B POS
Antibody Screen: NEGATIVE

## 2019-06-03 LAB — BASIC METABOLIC PANEL
Anion gap: 13 (ref 5–15)
BUN: 43 mg/dL — ABNORMAL HIGH (ref 8–23)
CO2: 24 mmol/L (ref 22–32)
Calcium: 8.7 mg/dL — ABNORMAL LOW (ref 8.9–10.3)
Chloride: 97 mmol/L — ABNORMAL LOW (ref 98–111)
Creatinine, Ser: 1.6 mg/dL — ABNORMAL HIGH (ref 0.44–1.00)
GFR calc Af Amer: 35 mL/min — ABNORMAL LOW (ref >60)
GFR calc non Af Amer: 30 mL/min — ABNORMAL LOW (ref >60)
Glucose, Bld: 98 mg/dL (ref 70–99)
Potassium: 3.5 mmol/L (ref 3.5–5.1)
Sodium: 134 mmol/L — ABNORMAL LOW (ref 135–145)

## 2019-06-03 LAB — CBC
HCT: 31.2 % — ABNORMAL LOW (ref 36.0–46.0)
Hemoglobin: 10.2 g/dL — ABNORMAL LOW (ref 12.0–15.0)
MCH: 31.2 pg (ref 26.0–34.0)
MCHC: 32.7 g/dL (ref 30.0–36.0)
MCV: 95.4 fL (ref 80.0–100.0)
Platelets: 292 10*3/uL (ref 150–400)
RBC: 3.27 MIL/uL — ABNORMAL LOW (ref 3.87–5.11)
RDW: 14.4 % (ref 11.5–15.5)
WBC: 5.9 10*3/uL (ref 4.0–10.5)
nRBC: 0 % (ref 0.0–0.2)

## 2019-06-03 SURGERY — REPAIR, TENDON, PATELLAR
Anesthesia: Spinal | Site: Knee | Laterality: Left

## 2019-06-03 MED ORDER — SODIUM CHLORIDE 0.9 % IV SOLN
INTRAVENOUS | Status: DC
  Administered 2019-06-03: 21:00:00 via INTRAVENOUS

## 2019-06-03 MED ORDER — TRAMADOL HCL 50 MG PO TABS: 50.0000 mg | ORAL_TABLET | Freq: Four times a day (QID) | ORAL | Status: DC | PRN

## 2019-06-03 MED ORDER — DEXAMETHASONE SODIUM PHOSPHATE 10 MG/ML IJ SOLN
10.0000 mg | Freq: Once | INTRAMUSCULAR | Status: AC
  Administered 2019-06-04: 08:00:00 10 mg via INTRAVENOUS
  Filled 2019-06-03: qty 1

## 2019-06-03 MED ORDER — NON FORMULARY: 40.0000 mg | Freq: Two times a day (BID) | Status: DC

## 2019-06-03 MED ORDER — SODIUM CHLORIDE (PF) 0.9 % IJ SOLN: INTRAMUSCULAR | Status: DC | PRN

## 2019-06-03 MED ORDER — KETOROLAC TROMETHAMINE 30 MG/ML IJ SOLN
INTRAMUSCULAR | Status: AC
  Filled 2019-06-03: qty 1

## 2019-06-03 MED ORDER — STERILE WATER FOR IRRIGATION IR SOLN
Status: DC | PRN
  Administered 2019-06-03: 2000 mL

## 2019-06-03 MED ORDER — ONDANSETRON HCL 4 MG/2ML IJ SOLN: 4.0000 mg | Freq: Four times a day (QID) | INTRAMUSCULAR | Status: DC | PRN

## 2019-06-03 MED ORDER — FENTANYL CITRATE (PF) 100 MCG/2ML IJ SOLN
INTRAMUSCULAR | Status: AC
  Filled 2019-06-03: qty 2

## 2019-06-03 MED ORDER — CEFAZOLIN SODIUM-DEXTROSE 2-4 GM/100ML-% IV SOLN
2.0000 g | INTRAVENOUS | Status: AC
  Administered 2019-06-03: 2 g via INTRAVENOUS
  Filled 2019-06-03: qty 100

## 2019-06-03 MED ORDER — LIDOCAINE 2% (20 MG/ML) 5 ML SYRINGE
INTRAMUSCULAR | Status: DC | PRN
  Administered 2019-06-03: 40 mg via INTRAVENOUS

## 2019-06-03 MED ORDER — CHLORHEXIDINE GLUCONATE 4 % EX LIQD
60.0000 mL | Freq: Once | CUTANEOUS | Status: AC
  Administered 2019-06-03: 4 via TOPICAL

## 2019-06-03 MED ORDER — KETOROLAC TROMETHAMINE 30 MG/ML IJ SOLN: INTRAMUSCULAR | Status: DC | PRN

## 2019-06-03 MED ORDER — POLYETHYLENE GLYCOL 3350 17 G PO PACK
17.0000 g | PACK | Freq: Two times a day (BID) | ORAL | Status: DC
  Administered 2019-06-04: 17 g via ORAL
  Filled 2019-06-03: qty 1

## 2019-06-03 MED ORDER — FERROUS SULFATE 325 (65 FE) MG PO TABS
325.0000 mg | ORAL_TABLET | Freq: Two times a day (BID) | ORAL | Status: DC
  Administered 2019-06-04: 08:00:00 325 mg via ORAL
  Filled 2019-06-03: qty 1

## 2019-06-03 MED ORDER — LACTATED RINGERS IV SOLN
INTRAVENOUS | Status: DC
  Administered 2019-06-03 (×2): via INTRAVENOUS

## 2019-06-03 MED ORDER — BUPIVACAINE HCL (PF) 0.25 % IJ SOLN
INTRAMUSCULAR | Status: AC
  Filled 2019-06-03: qty 30

## 2019-06-03 MED ORDER — MAGNESIUM CITRATE PO SOLN: 1.0000 | Freq: Once | ORAL | Status: DC | PRN

## 2019-06-03 MED ORDER — MIDAZOLAM HCL 2 MG/2ML IJ SOLN
INTRAMUSCULAR | Status: AC
  Filled 2019-06-03: qty 2

## 2019-06-03 MED ORDER — OXYCODONE HCL 5 MG PO TABS: 5.0000 mg | ORAL_TABLET | Freq: Once | ORAL | Status: DC | PRN

## 2019-06-03 MED ORDER — BUPIVACAINE HCL (PF) 0.25 % IJ SOLN
INTRAMUSCULAR | Status: DC | PRN
  Administered 2019-06-03: 6 mL via INTRA_ARTICULAR

## 2019-06-03 MED ORDER — ONDANSETRON HCL 4 MG PO TABS: 4.0000 mg | ORAL_TABLET | Freq: Four times a day (QID) | ORAL | Status: DC | PRN

## 2019-06-03 MED ORDER — MIDAZOLAM HCL 2 MG/2ML IJ SOLN
1.0000 mg | INTRAMUSCULAR | Status: DC
  Filled 2019-06-03: qty 2

## 2019-06-03 MED ORDER — TRANEXAMIC ACID-NACL 1000-0.7 MG/100ML-% IV SOLN
1000.0000 mg | INTRAVENOUS | Status: AC
  Administered 2019-06-03: 1000 mg via INTRAVENOUS
  Filled 2019-06-03: qty 100

## 2019-06-03 MED ORDER — FENTANYL CITRATE (PF) 100 MCG/2ML IJ SOLN
25.0000 ug | INTRAMUSCULAR | Status: DC | PRN
  Administered 2019-06-03 (×2): 50 ug via INTRAVENOUS

## 2019-06-03 MED ORDER — FENTANYL CITRATE (PF) 250 MCG/5ML IJ SOLN
INTRAMUSCULAR | Status: AC
  Filled 2019-06-03: qty 5

## 2019-06-03 MED ORDER — RIVAROXABAN 10 MG PO TABS
10.0000 mg | ORAL_TABLET | ORAL | Status: DC
  Administered 2019-06-04: 08:00:00 10 mg via ORAL
  Filled 2019-06-03: qty 1

## 2019-06-03 MED ORDER — METHYLPREDNISOLONE ACETATE 80 MG/ML IJ SUSP
INTRAMUSCULAR | Status: DC | PRN
  Administered 2019-06-03: 80 mg via INTRA_ARTICULAR

## 2019-06-03 MED ORDER — MENTHOL 3 MG MT LOZG: 1.0000 | LOZENGE | OROMUCOSAL | Status: DC | PRN

## 2019-06-03 MED ORDER — DOCUSATE SODIUM 100 MG PO CAPS
100.0000 mg | ORAL_CAPSULE | Freq: Two times a day (BID) | ORAL | Status: DC
  Administered 2019-06-04: 100 mg via ORAL
  Filled 2019-06-03 (×2): qty 1

## 2019-06-03 MED ORDER — PHENOL 1.4 % MT LIQD: 1.0000 | OROMUCOSAL | Status: DC | PRN

## 2019-06-03 MED ORDER — METHOCARBAMOL 500 MG PO TABS: 500.0000 mg | ORAL_TABLET | Freq: Four times a day (QID) | ORAL | Status: DC | PRN

## 2019-06-03 MED ORDER — SIMVASTATIN 20 MG PO TABS
20.0000 mg | ORAL_TABLET | Freq: Every evening | ORAL | Status: DC
  Filled 2019-06-03: qty 1

## 2019-06-03 MED ORDER — SODIUM CHLORIDE (PF) 0.9 % IJ SOLN
INTRAMUSCULAR | Status: AC
  Filled 2019-06-03: qty 50

## 2019-06-03 MED ORDER — ALUM & MAG HYDROXIDE-SIMETH 200-200-20 MG/5ML PO SUSP: 15.0000 mL | ORAL | Status: DC | PRN

## 2019-06-03 MED ORDER — DEXAMETHASONE SODIUM PHOSPHATE 10 MG/ML IJ SOLN: 10.0000 mg | Freq: Once | INTRAMUSCULAR | Status: DC

## 2019-06-03 MED ORDER — BUPIVACAINE IN DEXTROSE 0.75-8.25 % IT SOLN
INTRATHECAL | Status: DC | PRN
  Administered 2019-06-03: 1.6 mL via INTRATHECAL

## 2019-06-03 MED ORDER — ACETAMINOPHEN 500 MG PO TABS
1000.0000 mg | ORAL_TABLET | Freq: Three times a day (TID) | ORAL | Status: DC
  Administered 2019-06-04 (×2): 1000 mg via ORAL
  Filled 2019-06-03 (×2): qty 2

## 2019-06-03 MED ORDER — METHOCARBAMOL 500 MG IVPB - SIMPLE MED
500.0000 mg | Freq: Four times a day (QID) | INTRAVENOUS | Status: DC | PRN
  Administered 2019-06-03: 500 mg via INTRAVENOUS
  Filled 2019-06-03: qty 50

## 2019-06-03 MED ORDER — METHYLPREDNISOLONE ACETATE 40 MG/ML IJ SUSP
INTRAMUSCULAR | Status: AC
  Filled 2019-06-03: qty 1

## 2019-06-03 MED ORDER — ROPIVACAINE HCL 7.5 MG/ML IJ SOLN
INTRAMUSCULAR | Status: DC | PRN
  Administered 2019-06-03: 20 mL via PERINEURAL

## 2019-06-03 MED ORDER — OXYCODONE HCL 5 MG/5ML PO SOLN: 5.0000 mg | Freq: Once | ORAL | Status: DC | PRN

## 2019-06-03 MED ORDER — METOPROLOL TARTRATE 25 MG PO TABS
25.0000 mg | ORAL_TABLET | Freq: Every evening | ORAL | Status: DC
  Administered 2019-06-03: 25 mg via ORAL
  Filled 2019-06-03: qty 1

## 2019-06-03 MED ORDER — PROPOFOL 500 MG/50ML IV EMUL
INTRAVENOUS | Status: DC | PRN
  Administered 2019-06-03: 50 ug/kg/min via INTRAVENOUS

## 2019-06-03 MED ORDER — METOCLOPRAMIDE HCL 5 MG PO TABS: 5.0000 mg | ORAL_TABLET | Freq: Three times a day (TID) | ORAL | Status: DC | PRN

## 2019-06-03 MED ORDER — BUPIVACAINE-EPINEPHRINE (PF) 0.25% -1:200000 IJ SOLN: INTRAMUSCULAR | Status: DC | PRN

## 2019-06-03 MED ORDER — LEVOTHYROXINE SODIUM 88 MCG PO TABS
88.0000 ug | ORAL_TABLET | Freq: Every day | ORAL | Status: DC
  Administered 2019-06-04: 88 ug via ORAL
  Filled 2019-06-03: qty 1

## 2019-06-03 MED ORDER — METHOCARBAMOL 500 MG IVPB - SIMPLE MED
INTRAVENOUS | Status: AC
  Filled 2019-06-03: qty 50

## 2019-06-03 MED ORDER — FENTANYL CITRATE (PF) 100 MCG/2ML IJ SOLN
INTRAMUSCULAR | Status: DC | PRN
  Administered 2019-06-03 (×4): 50 ug via INTRAVENOUS

## 2019-06-03 MED ORDER — HYDROMORPHONE HCL 2 MG PO TABS
2.0000 mg | ORAL_TABLET | ORAL | Status: DC | PRN
  Administered 2019-06-03 – 2019-06-04 (×5): 2 mg via ORAL
  Filled 2019-06-03 (×5): qty 1

## 2019-06-03 MED ORDER — ONDANSETRON HCL 4 MG/2ML IJ SOLN: 4.0000 mg | Freq: Once | INTRAMUSCULAR | Status: DC | PRN

## 2019-06-03 MED ORDER — ONDANSETRON HCL 4 MG/2ML IJ SOLN
INTRAMUSCULAR | Status: DC | PRN
  Administered 2019-06-03: 4 mg via INTRAVENOUS

## 2019-06-03 MED ORDER — 0.9 % SODIUM CHLORIDE (POUR BTL) OPTIME
TOPICAL | Status: DC | PRN
  Administered 2019-06-03: 1000 mL

## 2019-06-03 MED ORDER — PHENYLEPHRINE HCL-NACL 10-0.9 MG/250ML-% IV SOLN
INTRAVENOUS | Status: DC | PRN
  Administered 2019-06-03: 25 ug/min via INTRAVENOUS

## 2019-06-03 MED ORDER — BISACODYL 10 MG RE SUPP: 10.0000 mg | Freq: Every day | RECTAL | Status: DC | PRN

## 2019-06-03 MED ORDER — EPHEDRINE SULFATE-NACL 50-0.9 MG/10ML-% IV SOSY
PREFILLED_SYRINGE | INTRAVENOUS | Status: DC | PRN
  Administered 2019-06-03: 10 mg via INTRAVENOUS

## 2019-06-03 MED ORDER — OMEPRAZOLE 20 MG PO CPDR
40.0000 mg | DELAYED_RELEASE_CAPSULE | Freq: Two times a day (BID) | ORAL | Status: DC
  Administered 2019-06-04: 07:00:00 40 mg via ORAL
  Filled 2019-06-03 (×2): qty 2

## 2019-06-03 MED ORDER — FENTANYL CITRATE (PF) 100 MCG/2ML IJ SOLN
50.0000 ug | INTRAMUSCULAR | Status: DC
  Administered 2019-06-03: 50 ug via INTRAVENOUS
  Filled 2019-06-03: qty 2

## 2019-06-03 MED ORDER — PROPOFOL 10 MG/ML IV BOLUS
INTRAVENOUS | Status: DC | PRN
  Administered 2019-06-03: 20 mg via INTRAVENOUS
  Administered 2019-06-03: 10 mg via INTRAVENOUS
  Administered 2019-06-03 (×2): 20 mg via INTRAVENOUS

## 2019-06-03 MED ORDER — DEXAMETHASONE SODIUM PHOSPHATE 10 MG/ML IJ SOLN
INTRAMUSCULAR | Status: DC | PRN
  Administered 2019-06-03: 10 mg via INTRAVENOUS

## 2019-06-03 MED ORDER — PROPOFOL 10 MG/ML IV BOLUS
INTRAVENOUS | Status: AC
  Filled 2019-06-03: qty 20

## 2019-06-03 MED ORDER — DIPHENHYDRAMINE HCL 12.5 MG/5ML PO ELIX: 12.5000 mg | ORAL_SOLUTION | ORAL | Status: DC | PRN

## 2019-06-03 MED ORDER — METOCLOPRAMIDE HCL 5 MG/ML IJ SOLN: 5.0000 mg | Freq: Three times a day (TID) | INTRAMUSCULAR | Status: DC | PRN

## 2019-06-03 MED ORDER — CEFAZOLIN SODIUM-DEXTROSE 2-4 GM/100ML-% IV SOLN
2.0000 g | Freq: Four times a day (QID) | INTRAVENOUS | Status: AC
  Administered 2019-06-04 (×2): 2 g via INTRAVENOUS
  Filled 2019-06-03 (×2): qty 100

## 2019-06-03 MED ORDER — BUPIVACAINE-EPINEPHRINE 0.25% -1:200000 IJ SOLN
INTRAMUSCULAR | Status: AC
  Filled 2019-06-03: qty 1

## 2019-06-03 SURGICAL SUPPLY — 54 items
ADH SKN CLS APL DERMABOND .7 (GAUZE/BANDAGES/DRESSINGS) ×2
BAG SPEC THK2 15X12 ZIP CLS (MISCELLANEOUS)
BAG ZIPLOCK 12X15 (MISCELLANEOUS) IMPLANT
BANDAGE ESMARK 6X9 LF (GAUZE/BANDAGES/DRESSINGS) ×1 IMPLANT
BIT DRILL 2.4X128 (BIT) ×2 IMPLANT
BLADE SAW SGTL 11.0X1.19X90.0M (BLADE) IMPLANT
BNDG CMPR 9X6 STRL LF SNTH (GAUZE/BANDAGES/DRESSINGS) ×1
BNDG ELASTIC 6X5.8 VLCR STR LF (GAUZE/BANDAGES/DRESSINGS) ×2 IMPLANT
BNDG ESMARK 6X9 LF (GAUZE/BANDAGES/DRESSINGS) ×2
COVER SURGICAL LIGHT HANDLE (MISCELLANEOUS) ×2 IMPLANT
COVER WAND RF STERILE (DRAPES) IMPLANT
CUFF TOURN SGL QUICK 34 (TOURNIQUET CUFF) ×2
CUFF TRNQT CYL 34X4.125X (TOURNIQUET CUFF) ×1 IMPLANT
DERMABOND ADVANCED (GAUZE/BANDAGES/DRESSINGS) ×2
DERMABOND ADVANCED .7 DNX12 (GAUZE/BANDAGES/DRESSINGS) ×1 IMPLANT
DRAPE SHEET LG 3/4 BI-LAMINATE (DRAPES) ×2 IMPLANT
DRAPE U-SHAPE 47X51 STRL (DRAPES) ×2 IMPLANT
DRESSING AQUACEL AG SP 3.5X10 (GAUZE/BANDAGES/DRESSINGS) IMPLANT
DRSG AQUACEL AG ADV 3.5X10 (GAUZE/BANDAGES/DRESSINGS) ×2 IMPLANT
DRSG AQUACEL AG SP 3.5X10 (GAUZE/BANDAGES/DRESSINGS) ×2
DURAPREP 26ML APPLICATOR (WOUND CARE) ×2 IMPLANT
ELECT REM PT RETURN 15FT ADLT (MISCELLANEOUS) ×2 IMPLANT
FACESHIELD WRAPAROUND (MASK) ×4 IMPLANT
FACESHIELD WRAPAROUND OR TEAM (MASK) ×2 IMPLANT
GLOVE BIOGEL M 7.0 STRL (GLOVE) IMPLANT
GLOVE BIOGEL PI IND STRL 7.5 (GLOVE) ×1 IMPLANT
GLOVE BIOGEL PI IND STRL 8.5 (GLOVE) ×1 IMPLANT
GLOVE BIOGEL PI INDICATOR 7.5 (GLOVE) ×1
GLOVE BIOGEL PI INDICATOR 8.5 (GLOVE) ×1
GLOVE ECLIPSE 8.0 STRL XLNG CF (GLOVE) ×2 IMPLANT
GLOVE ORTHO TXT STRL SZ7.5 (GLOVE) ×2 IMPLANT
GOWN STRL REUS W/TWL LRG LVL3 (GOWN DISPOSABLE) ×2 IMPLANT
GOWN STRL REUS W/TWL XL LVL3 (GOWN DISPOSABLE) ×2 IMPLANT
IMMOBILIZER KNEE 20 (SOFTGOODS) ×2
IMMOBILIZER KNEE 20 THIGH 36 (SOFTGOODS) IMPLANT
KIT BASIN OR (CUSTOM PROCEDURE TRAY) ×2 IMPLANT
KIT TURNOVER KIT A (KITS) IMPLANT
MANIFOLD NEPTUNE II (INSTRUMENTS) ×2 IMPLANT
NDL MAYO 6 CRC TAPER PT (NEEDLE) IMPLANT
NEEDLE MAYO 6 CRC TAPER PT (NEEDLE) ×2 IMPLANT
PACK TOTAL JOINT (CUSTOM PROCEDURE TRAY) ×2 IMPLANT
PASSER SUT SWANSON 36MM LOOP (INSTRUMENTS) ×2 IMPLANT
PENCIL SMOKE EVACUATOR (MISCELLANEOUS) ×1 IMPLANT
PROTECTOR NERVE ULNAR (MISCELLANEOUS) ×2 IMPLANT
SUT ETHIBOND NAB CT1 #1 30IN (SUTURE) IMPLANT
SUT FIBERWIRE #2 38 T-5 BLUE (SUTURE) ×4
SUT MNCRL AB 4-0 PS2 18 (SUTURE) ×2 IMPLANT
SUT VIC AB 1 CT1 36 (SUTURE) ×4 IMPLANT
SUT VIC AB 2-0 CT1 27 (SUTURE) ×8
SUT VIC AB 2-0 CT1 TAPERPNT 27 (SUTURE) ×2 IMPLANT
SUTURE FIBERWR #2 38 T-5 BLUE (SUTURE) ×2 IMPLANT
TOWEL OR 17X26 10 PK STRL BLUE (TOWEL DISPOSABLE) ×2 IMPLANT
TOWEL OR NON WOVEN STRL DISP B (DISPOSABLE) ×2 IMPLANT
WRAP KNEE MAXI GEL POST OP (GAUZE/BANDAGES/DRESSINGS) ×1 IMPLANT

## 2019-06-03 NOTE — Anesthesia Procedure Notes (Signed)
Date/Time: 06/03/2019 3:35 PM Performed by: Cynda Familia, CRNA Pre-anesthesia Checklist: Patient identified, Emergency Drugs available, Suction available, Patient being monitored and Timeout performed Patient Re-evaluated:Patient Re-evaluated prior to induction Oxygen Delivery Method: Simple face mask Placement Confirmation: positive ETCO2 and breath sounds checked- equal and bilateral Dental Injury: Teeth and Oropharynx as per pre-operative assessment

## 2019-06-03 NOTE — Anesthesia Procedure Notes (Signed)
Spinal  Patient location during procedure: OR Start time: 06/03/2019 3:42 PM End time: 06/03/2019 3:47 PM Staffing Anesthesiologist: Audry Pili, MD Performed: anesthesiologist  Preanesthetic Checklist Completed: patient identified, surgical consent, pre-op evaluation, timeout performed, IV checked, risks and benefits discussed and monitors and equipment checked Spinal Block Patient position: sitting Prep: DuraPrep Patient monitoring: heart rate, cardiac monitor, continuous pulse ox and blood pressure Approach: midline Location: L3-4 Injection technique: single-shot Needle Needle type: Pencan  Needle gauge: 24 G Additional Notes Consent was obtained prior to the procedure with all questions answered and concerns addressed. Risks including, but not limited to, bleeding, infection, nerve damage, paralysis, failed block, inadequate analgesia, allergic reaction, high spinal, itching, and headache were discussed and the patient wished to proceed. Functioning IV was confirmed and monitors were applied. Sterile prep and drape, including hand hygiene, mask, and sterile gloves were used. The patient was positioned and the spine was prepped. The skin was anesthetized with lidocaine. Free flow of clear CSF was obtained prior to injecting local anesthetic into the CSF. The spinal needle aspirated freely following injection. The needle was carefully withdrawn. The patient tolerated the procedure well.   Renold Don, MD

## 2019-06-03 NOTE — Anesthesia Postprocedure Evaluation (Signed)
Anesthesia Post Note  Patient: Kathleen Paul  Procedure(s) Performed: LEFT PATELLA TENDON REPAIR, RIGHT KNEE INJECTION. (Left Knee)     Patient location during evaluation: PACU Anesthesia Type: Spinal Level of consciousness: awake and alert Pain management: pain level controlled Vital Signs Assessment: post-procedure vital signs reviewed and stable Respiratory status: spontaneous breathing and respiratory function stable Cardiovascular status: blood pressure returned to baseline and stable Postop Assessment: spinal receding and no apparent nausea or vomiting Anesthetic complications: no    Last Vitals:  Vitals:   06/03/19 1830 06/03/19 1848  BP: (!) 153/73 (!) 153/87  Pulse: 77 80  Resp: 11 14  Temp: 36.4 C 36.5 C  SpO2: 100% 100%    Last Pain:  Vitals:   06/03/19 1830  TempSrc:   PainSc: Blanchard Arianna Delsanto

## 2019-06-03 NOTE — Transfer of Care (Signed)
Immediate Anesthesia Transfer of Care Note  Patient: Kathleen Paul  Procedure(s) Performed: LEFT PATELLA TENDON REPAIR, RIGHT KNEE INJECTION. (Left Knee)  Patient Location: PACU  Anesthesia Type:Spinal  Level of Consciousness: awake  Airway & Oxygen Therapy: Patient Spontanous Breathing and Patient connected to face mask oxygen  Post-op Assessment: Report given to RN and Post -op Vital signs reviewed and stable  Post vital signs: Reviewed and stable  Last Vitals:  Vitals Value Taken Time  BP 109/79 06/03/19 1754  Temp    Pulse 78 06/03/19 1758  Resp 11 06/03/19 1758  SpO2 100 % 06/03/19 1758  Vitals shown include unvalidated device data.  Last Pain:  Vitals:   06/03/19 1445  TempSrc:   PainSc: 0-No pain      Patients Stated Pain Goal: 2 ([id number])  Complications: No apparent anesthesia complications

## 2019-06-03 NOTE — Brief Op Note (Signed)
06/03/2019  5:20 PM  PATIENT:  Kathleen Paul  83 y.o. female  PRE-OPERATIVE DIAGNOSIS:  Left patellar tendon avulsion off the inferior pole of the patella status post left total knee replacement, 2. Right knee osteoarthritis and pain  POST-OPERATIVE DIAGNOSIS:  Left patellar tendon avulsion off the inferior pole of the patella status post left total knee replacement, 2. Right knee osteoarthritis and pain  PROCEDURE:  Procedure(s): LEFT PATELLA TENDON REPAIR, RIGHT KNEE INJECTION. (Left)  2. Right knee intra-articular injection with cortisone  SURGEON:  Surgeon(s) and Role:    Paralee Cancel, MD - Primary  PHYSICIAN ASSISTANT: Griffith Citron, PA-C  ANESTHESIA:   regional and spinal  EBL:  <50cc  BLOOD ADMINISTERED:none  DRAINS: none   LOCAL MEDICATIONS USED: None in the left knee, cortisone (80mg ) and marcaine in the right knee  SPECIMEN:  No Specimen  DISPOSITION OF SPECIMEN:  N/A  COUNTS:  YES  TOURNIQUET:   Total Tourniquet Time Documented: Thigh (Left) - 43 minutes Total: Thigh (Left) - 43 minutes   DICTATION: .Other Dictation: Dictation Number [id number]  PLAN OF CARE: Admit for overnight observation  PATIENT DISPOSITION:  PACU - hemodynamically stable.   Delay start of Pharmacological VTE agent (>24hrs) due to surgical blood loss or risk of bleeding: no

## 2019-06-03 NOTE — Op Note (Signed)
NAME: Kathleen Paul, Kathleen Paul MEDICAL RECORD [id number] ACCOUNT [id number] DATE OF BIRTH:1936-02-27 FACILITY: [facility] LOCATION: [id number] PHYSICIAN:Jennfer Gassen Marian Sorrow, MD  OPERATIVE REPORT  DATE OF PROCEDURE:  06/03/2019  PREOPERATIVE DIAGNOSES:   1.  Left patellar tendon avulsion off the inferior pole of the patella  following total knee arthroplasty. 2.  Right knee osteoarthritis and pain.  POSTOPERATIVE DIAGNOSES:   1.  Left patellar tendon avulsion off the inferior pole of the patella following total knee arthroplasty. 2.  Right knee osteoarthritis and pain.  PROCEDURES:   1.  Left patellar tendon avulsion repair following total knee arthroplasty. 2.  Right knee intraarticular injection of cortisone and Marcaine.  SURGEON:  Paralee Cancel, MD  ASSISTANT:  Griffith Citron PA-C.  Note that Kathleen Paul was present for the entirety of the case from preoperative positioning, perioperative management of the operative extremity, general facilitation of the case and primary wound closure.  ANESTHESIA:  Regional plus spinal.  SPECIMENS:  None.  DRAINS:  None.  TOURNIQUET:  Up for 43 minutes at 250 mmHg.  INDICATIONS  FOR PROCEDURE:  The patient is an 83 year old female with history of left total knee replacement performed in 02/2019.  She had been seen in regular followup, doing well until last week when she stated that she was trying to work on range of  motion as well as stair climbing when she felt a pop in her knee.  She had immediate onset of pain and loss of ability to perform straight leg raise.  She was seen in the office last week and was noted to have an avulsion injury radiographically.  It  was not clear to me if this was coming off the tubercle or the patella, so as an MRI was ordered.  MRI confirmed that the avulsion was off the inferior pole of the patella.  Surgery was scheduled for today for repair of the patellar tendon.  The  postoperative course including prolonged  immobilization for 4-6 weeks discussed, as well as the complications associated with stiffness postoperatively, but the importance of maintaining the extensor mechanism for function down the road.  Standard risk  of infection, DVT discussed as well.  Need for future surgeries reviewed.  This would include revision of the repair versus patellectomy if we run into major problems.  Consent was obtained for improved patellar tendon repair and function.  Additionally,  consent was obtained for right knee intraarticular injection due to increase in pain due to the load bearing through the right knee from favoring her left knee in this injury to date.  Consent was obtained.  DESCRIPTION OF PROCEDURE:  The patient was brought to the operative theater.  Once adequate anesthesia, preoperative antibiotics, Ancef administered, she was positioned supine with a left thigh tourniquet placed.  The left lower extremity was prepped and  draped in sterile fashion.  A timeout was performed identifying the patient, the planned procedures and extremities.  Attention was first directed to the left knee.  The left knee was exsanguinated, tourniquet elevated to 250 mmHg.  Her incision was  excised.  Soft tissue planes created.  Here I found an intact retinaculum over the entire extent of the knee.  There was no disruption medial and lateral and the superficial retinacular tissue had been thickened over top of this.  I made medial and  lateral arthrotomy incisions to identify the patellar tendon as well as for repair.  We identified that the right thickened retinaculum basically was holding the extensor mechanism in place,  though there was noted to be patella alta preoperatively and  clinically.  I did a transverse incision in the retinacular tissue and preserved the proximal and distal ends of it for later repair.  Following debridement of bone fragments from this area from the patellar tendon region, as well as preparing the  distal  pole of the patella by removing soft tissues and exposing the bone, I then used two #2 FiberWire sutures and passed them through the patella tendon in a [place] weave pattern.  With the 4 strands of suture coming out the proximal aspect of the tendon,  I  then drilled as carefully as possible 3 drill holes through the patella anterior to the prosthesis utilizing a 2.8 drill bit.  With these holes made, we then passed the central 2 sutures through the central hole and the 2 outer sutures medial and  lateral through the separate holes.  We then used a bone tenaculum and pulled the patella down to its near anatomic position, while at the same time pulling the patellar tendon back to the bone.  The suture was then tightened down, starting with the  medial 2 sutures and then the lateral and then the 2 together.  At this point, we had good patellar tendon contact to bone.  Once this was completed, we used #1 Vicryl to reapproximate the remainder of her soft tissues.  I first reapproximated the  thickened retinacular tissue over the suture line and then reapproximated the medial and lateral retinacular incisions to the patellar tendon and the peripatellar tissues.  At this point, we had a watertight seal.  The patella appeared to be in its  normal position with much more enhanced stability.  Once these layers were closed, the remainder of the wound was closed with 2-0 Vicryl and a running Monocryl stitch.  The knee was then cleaned, dried and dressed sterilely using surgical glue and  Aquacel dressing.    Upon conclusion of the left knee procedure, the right knee was prepped sterilely and injected with 80 mg of Depo-Medrol and 6 mL of Marcaine.  This was dressed with a Band-Aid.  She was then brought to the recovery room in stable condition, tolerating  the procedure well.  Her left knee was placed into a bulky wrap with a knee immobilizer.  She will be admitted to the hospital for observation.  We will  order a T-ROM brace locked in extension for easier management.  We will see her back in the office in 2 weeks.  She will be weightbearing as tolerated, but with the knee in full extension for at least 4-6 weeks.  VN/NUANCE  D:06/03/2019 T:06/03/2019 JOB:[id number]

## 2019-06-03 NOTE — Plan of Care (Signed)

## 2019-06-03 NOTE — Discharge Instructions (Addendum)
Elastic Bandage and RICE Therapy  Elastic bandages come in different shapes and sizes. They generally provide support to your injury and reduce swelling while you are healing, but they can perform different functions. Your health care provider will help you to decide what is best for your protection, recovery, or rehabilitation after an injury. The routine care of many injuries includes rest, ice, compression, and elevation (RICE therapy). RICE therapy is often recommended for injuries to soft tissues, such as muscle strain, sprains, bruises, and overuse injuries. It can also be used for some bone injuries. Using RICE therapy can help to relieve pain and lessen swelling. What are some general tips for using an elastic bandage?  Use the bandage as directed by the maker of the bandage that you are using.  Do not wrap the bandage too tightly. This may block (cut off) the circulation in the arm or leg in the area below the bandage. ? If part of your body beyond the bandage becomes blue, numb, cold, swollen, or more painful, your bandage is probably too tight. If this occurs, remove your bandage and reapply it more loosely.  Remove and reapply an elastic bandage every 3-4 hours or as told by your health care provider.  See your health care provider if the bandage seems to be making your problems worse rather than better. How to care for your injury with RICE therapy Rest Rest your injury. This may help with the healing process. Rest usually involves limiting your normal activities and not using the injured part of your body. Generally, you can return to your normal activities when your health care provider says it is okay and you can do them without much discomfort. If you rest the injury too much, it may not heal as well. Some injuries heal better with early movement instead of resting for too long. Talk with your health care provider about how you should limit your activities and whether you should  start range-of-motion exercises for your injury. Ice Ice your injury to lessen swelling and pain. Do not apply ice directly to your skin.  Put ice in a plastic bag.  Place a towel between your skin and the bag.  Leave the ice on for 20 minutes, 2-3 times a day. Use ice on as many days as told by your health care provider.  Compression Put pressure (compression) on your injured area to control swelling, give support, and help with discomfort. Compression may be done with an elastic bandage. Elevation Raise (elevate) your injured area to lessen swelling and pain. If possible, elevate your injured area at or above the level of your heart or the center of your chest. Contact a health care provider if:  Your pain and swelling continue.  Your symptoms are getting worse rather than improving. Having these problems may mean that you need further evaluation or imaging tests, such as X-rays or an MRI. Sometimes, X-rays may not show a small broken bone (fracture) until days after the injury happened. Make a follow-up appointment with your health care provider. Ask your health care provider, or the department that is doing the imaging test, when your results will be ready. Get help right away if:  You have sudden severe pain at or below the area of your injury.  You have redness or increased swelling around your injury.  You have tingling or numbness at or below the area of your injury and it does not improve after you remove the elastic bandage. Summary  Elastic bandages provide support to your injury and reduce swelling while you are healing. Your health care provider will help you decide which type of elastic bandage is best for your injury.  Do not wrap the bandage too tightly. This may block (cut off) the circulation in the arm or leg in the area below the bandage.  Putting pressure (compression) on your injured area with an elastic bandage is part of RICE therapy. RICE therapy includes  rest, ice, compression, and elevation. This treatment is recommended for the routine care of many injuries. This information is not intended to replace advice given to you by your health care provider. Make sure you discuss any questions you have with your health care provider. Document Released: 12/16/2001 Document Revised: 03/16/2017 Document Reviewed: 03/16/2017 Elsevier Patient Education  2020 [employer] on my medicine - XARELTO (Rivaroxaban)  Why was Xarelto prescribed for you? Xarelto was prescribed for you to reduce the risk of blood clots forming after orthopedic surgery. The medical term for these abnormal blood clots is venous thromboembolism (VTE).  What do you need to know about xarelto ? Take your Xarelto ONCE DAILY at the same time every day. You may take it either with or without food.  If you have difficulty swallowing the tablet whole, you may crush it and mix in applesauce just prior to taking your dose.  Take Xarelto exactly as prescribed by your doctor and DO NOT stop taking Xarelto without talking to the doctor who prescribed the medication.  Stopping without other VTE prevention medication to take the place of Xarelto may increase your risk of developing a clot.  After discharge, you should have regular check-up appointments with your healthcare provider that is prescribing your Xarelto.    What do you do if you miss a dose? If you miss a dose, take it as soon as you remember on the same day then continue your regularly scheduled once daily regimen the next day. Do not take two doses of Xarelto on the same day.   Important Safety Information A possible side effect of Xarelto is bleeding. You should call your healthcare provider right away if you experience any of the following: ? Bleeding from an injury or your nose that does not stop. ? Unusual colored urine (red or dark brown) or unusual colored stools (red or black). ? Unusual bruising for  unknown reasons. ? A serious fall or if you hit your head (even if there is no bleeding).  Some medicines may interact with Xarelto and might increase your risk of bleeding while on Xarelto. To help avoid this, consult your healthcare provider or pharmacist prior to using any new prescription or non-prescription medications, including herbals, vitamins, non-steroidal anti-inflammatory drugs (NSAIDs) and supplements.  This website has more information on Xarelto: [email or web address]

## 2019-06-03 NOTE — Interval H&P Note (Signed)
History and Physical Interval Note:  06/03/2019 12:30 PM  Kathleen Paul  has presented today for surgery, with the diagnosis of Left patellar tendon rupture status post left total knee.  The various methods of treatment have been discussed with the patient and family. After consideration of risks, benefits and other options for treatment, the patient has consented to  Procedure(s): PATELLA TENDON REPAIR (Left) as a surgical intervention.  The patient's history has been reviewed, patient examined, no change in status, stable for surgery.  I have reviewed the patient's chart and labs.  Questions were answered to the patient's satisfaction.     [name]

## 2019-06-03 NOTE — Progress Notes (Signed)
AssistedDr. Brock with left, ultrasound guided, adductor canal block. Side rails up, monitors on throughout procedure. See vital signs in flow sheet. Tolerated Procedure well.  

## 2019-06-03 NOTE — Anesthesia Procedure Notes (Signed)
Anesthesia Regional Block: Adductor canal block   Pre-Anesthetic Checklist: ,, timeout performed, Correct Patient, Correct Site, Correct Laterality, Correct Procedure, Correct Position, site marked, Risks and benefits discussed,  Surgical consent,  Pre-op evaluation,  At surgeon's request and post-op pain management  Laterality: Left  Prep: chloraprep       Needles:  Injection technique: Single-shot  Needle Type: Echogenic Needle     Needle Length: 10cm  Needle Gauge: 21     Additional Needles:   Narrative:  Start time: 06/03/2019 2:40 PM End time: 06/03/2019 2:44 PM Injection made incrementally with aspirations every 5 mL.  Performed by: Personally  Anesthesiologist: Audry Pili, MD  Additional Notes: No pain on injection. No increased resistance to injection. Injection made in 5cc increments. Good needle visualization. Patient tolerated the procedure well.

## 2019-06-04 ENCOUNTER — Encounter (HOSPITAL_COMMUNITY): Payer: Self-pay | Admitting: Orthopedic Surgery

## 2019-06-04 DIAGNOSIS — S76192A Other specified injury of left quadriceps muscle, fascia and tendon, initial encounter: Secondary | ICD-10-CM | POA: Diagnosis not present

## 2019-06-04 DIAGNOSIS — E663 Overweight: Secondary | ICD-10-CM | POA: Diagnosis present

## 2019-06-04 DIAGNOSIS — S86812A Strain of other muscle(s) and tendon(s) at lower leg level, left leg, initial encounter: Secondary | ICD-10-CM | POA: Diagnosis not present

## 2019-06-04 HISTORY — DX: Overweight: E66.3

## 2019-06-04 LAB — CBC
HCT: 28.5 % — ABNORMAL LOW (ref 36.0–46.0)
Hemoglobin: 9.1 g/dL — ABNORMAL LOW (ref 12.0–15.0)
MCH: 30.6 pg (ref 26.0–34.0)
MCHC: 31.9 g/dL (ref 30.0–36.0)
MCV: 96 fL (ref 80.0–100.0)
Platelets: 287 10*3/uL (ref 150–400)
RBC: 2.97 MIL/uL — ABNORMAL LOW (ref 3.87–5.11)
RDW: 14.1 % (ref 11.5–15.5)
WBC: 3.4 10*3/uL — ABNORMAL LOW (ref 4.0–10.5)
nRBC: 0 % (ref 0.0–0.2)

## 2019-06-04 LAB — BASIC METABOLIC PANEL
Anion gap: 12 (ref 5–15)
BUN: 31 mg/dL — ABNORMAL HIGH (ref 8–23)
CO2: 23 mmol/L (ref 22–32)
Calcium: 8.4 mg/dL — ABNORMAL LOW (ref 8.9–10.3)
Chloride: 99 mmol/L (ref 98–111)
Creatinine, Ser: 1.14 mg/dL — ABNORMAL HIGH (ref 0.44–1.00)
GFR calc Af Amer: 52 mL/min — ABNORMAL LOW (ref >60)
GFR calc non Af Amer: 45 mL/min — ABNORMAL LOW (ref >60)
Glucose, Bld: 176 mg/dL — ABNORMAL HIGH (ref 70–99)
Potassium: 4.2 mmol/L (ref 3.5–5.1)
Sodium: 134 mmol/L — ABNORMAL LOW (ref 135–145)

## 2019-06-04 MED ORDER — RIVAROXABAN 10 MG PO TABS: 10.0000 mg | ORAL_TABLET | Freq: Every day | ORAL | 0 refills | Status: DC

## 2019-06-04 MED ORDER — METHOCARBAMOL 500 MG PO TABS: 500.0000 mg | ORAL_TABLET | Freq: Four times a day (QID) | ORAL | 0 refills | Status: DC | PRN

## 2019-06-04 MED ORDER — POLYETHYLENE GLYCOL 3350 17 G PO PACK: 17.0000 g | PACK | Freq: Two times a day (BID) | ORAL | 0 refills | Status: DC

## 2019-06-04 MED ORDER — ASPIRIN 81 MG PO CHEW: 81.0000 mg | CHEWABLE_TABLET | Freq: Two times a day (BID) | ORAL | 0 refills | Status: DC

## 2019-06-04 MED ORDER — HYDROMORPHONE HCL 2 MG PO TABS: 2.0000 mg | ORAL_TABLET | Freq: Four times a day (QID) | ORAL | 0 refills | Status: DC | PRN

## 2019-06-04 MED ORDER — DOCUSATE SODIUM 100 MG PO CAPS: 100.0000 mg | ORAL_CAPSULE | Freq: Two times a day (BID) | ORAL | 0 refills | Status: DC

## 2019-06-04 MED ORDER — FERROUS SULFATE 325 (65 FE) MG PO TABS: 325.0000 mg | ORAL_TABLET | Freq: Three times a day (TID) | ORAL | 0 refills | Status: DC

## 2019-06-04 NOTE — Progress Notes (Signed)
Orthopedic Tech Progress Note Patient Details:  Kathleen Paul 24-Jul-1935 [id number]  Patient ID: Kathleen Paul, female   DOB: 31-Aug-1935, 83 y.o.   MRN: [id number]   Kathleen Paul 06/04/2019, 10:47 AMCalled [facility] for left ROM knee brace.

## 2019-06-04 NOTE — Plan of Care (Signed)
Care plan reviewed.

## 2019-06-04 NOTE — Plan of Care (Signed)
  Problem: Education: Goal: Knowledge of General Education information will improve Description: Including pain rating scale, medication(s)/side effects and non-pharmacologic comfort measures 06/04/2019 0937 by Hubert Azure, RN Outcome: Progressing 06/03/2019 2004 by Hubert Azure, RN Outcome: Progressing   Problem: Health Behavior/Discharge Planning: Goal: Ability to manage health-related needs will improve 06/04/2019 0937 by Hubert Azure, RN Outcome: Progressing 06/03/2019 2004 by Hubert Azure, RN Outcome: Progressing   Problem: Clinical Measurements: Goal: Ability to maintain clinical measurements within normal limits will improve 06/04/2019 0937 by Hubert Azure, RN Outcome: Progressing 06/03/2019 2004 by Hubert Azure, RN Outcome: Progressing Goal: Will remain free from infection 06/04/2019 0937 by Hubert Azure, RN Outcome: Progressing 06/03/2019 2004 by Hubert Azure, RN Outcome: Progressing

## 2019-06-04 NOTE — Evaluation (Signed)
Physical Therapy Evaluation Patient Details Name: Kathleen Paul MRN: [id number] DOB: 08-26-1935 Today's Date: 06/04/2019   History of Present Illness  Pt s/p L patella tendon repair.  Pt with hx of L TKR 02/11/19  Clinical Impression  Pt s/p L patella tendon repair and presents with decreased strength R LE, NO ROM R knee and post op pain limiting functional mobility.  Pt should progress to dc home with assist of dtr.    Follow Up Recommendations Follow surgeon's recommendation for DC plan and follow-up therapies    Equipment Recommendations  None recommended by PT    Recommendations for Other Services       Precautions / Restrictions Precautions Precautions: Knee;Fall Precaution Comments: NO ROM L knee Required Braces or Orthoses: Knee Immobilizer - Left Knee Immobilizer - Left: On at all times Restrictions Weight Bearing Restrictions: No Other Position/Activity Restrictions: WBAT with KI in place      Mobility  Bed Mobility Overal bed mobility: Needs Assistance Bed Mobility: Supine to Sit     Supine to sit: Min assist     General bed mobility comments: cues for sequence and use of R LE to self assist.  Pt states will likely sleep in lift chair initially  Transfers Overall transfer level: Needs assistance Equipment used: Rolling walker (2 wheeled) Transfers: Sit to/from Stand Sit to Stand: Min assist         General transfer comment: cues for LE management and use of UEs to self assist  Ambulation/Gait Ambulation/Gait assistance: Min assist;Min guard Gait Distance (Feet): 75 Feet Assistive device: Rolling walker (2 wheeled) Gait Pattern/deviations: Step-to pattern;Step-through pattern;Decreased step length - right;Decreased step length - left;Shuffle;Trunk flexed Gait velocity: decr   General Gait Details: cues for posture, position from RW and sequence  Stairs            Wheelchair Mobility    Modified Rankin (Stroke Patients Only)        Balance Overall balance assessment: Mild deficits observed, not formally tested                                           Pertinent Vitals/Pain Pain Assessment: 0-10 Pain Score: 3  Pain Location: L knee Pain Descriptors / Indicators: Aching;Sore Pain Intervention(s): Limited activity within patient's tolerance;Monitored during session;Premedicated before session;Ice applied    Home Living Family/patient expects to be discharged to:: Private residence Living Arrangements: Alone Available Help at Discharge: Family Type of Home: House Home Access: Stairs to enter Entrance Stairs-Rails: Chemical engineer of Steps: 2 Home Layout: One level Home Equipment: Environmental consultant - 2 wheels;Cane - single point Additional Comments: Dtr will be assisting for first ~ 1 week    Prior Function Level of Independence: Independent               Hand Dominance        Extremity/Trunk Assessment   Upper Extremity Assessment Upper Extremity Assessment: Overall WFL for tasks assessed    Lower Extremity Assessment Lower Extremity Assessment: LLE deficits/detail LLE Deficits / Details: KI in place - NO ROM at knee    Cervical / Trunk Assessment Cervical / Trunk Assessment: Normal  Communication   Communication: No difficulties  Cognition Arousal/Alertness: Awake/alert Behavior During Therapy: WFL for tasks assessed/performed Overall Cognitive Status: Within Functional Limits for tasks assessed  General Comments      Exercises General Exercises - Lower Extremity Ankle Circles/Pumps: AROM;Both;15 reps;Supine   Assessment/Plan    PT Assessment Patient needs continued PT services  PT Problem List Decreased strength;Decreased range of motion;Decreased activity tolerance;Decreased mobility;Pain;Decreased knowledge of use of DME       PT Treatment Interventions DME instruction;Gait training;Stair  training;Functional mobility training;Therapeutic activities;Therapeutic exercise;Patient/family education    PT Goals (Current goals can be found in the Care Plan section)  Acute Rehab PT Goals Patient Stated Goal: Regain IND PT Goal Formulation: With patient Time For Goal Achievement: 06/11/19 Potential to Achieve Goals: Good    Frequency 7X/week   Barriers to discharge        Co-evaluation               AM-PAC PT "6 Clicks" Mobility  Outcome Measure Help needed turning from your back to your side while in a flat bed without using bedrails?: A Little Help needed moving from lying on your back to sitting on the side of a flat bed without using bedrails?: A Little Help needed moving to and from a bed to a chair (including a wheelchair)?: A Little Help needed standing up from a chair using your arms (e.g., wheelchair or bedside chair)?: A Little Help needed to walk in hospital room?: A Little Help needed climbing 3-5 steps with a railing? : A Little 6 Click Score: 18    End of Session Equipment Utilized During Treatment: Gait belt;Left knee immobilizer Activity Tolerance: Patient tolerated treatment well Patient left: in chair;with call bell/phone within reach;with chair alarm set Nurse Communication: Mobility status PT Visit Diagnosis: Difficulty in walking, not elsewhere classified (R26.2)    Time: [id number] PT Time Calculation (min) (ACUTE ONLY): 28 min   Charges:   PT Evaluation $PT Eval Low Complexity: 1 Low PT Treatments $Gait Training: 8-22 mins        [place] Pager [phone] Office [phone]   Eulia Hatcher 06/04/2019, 1:19 PM

## 2019-06-04 NOTE — Progress Notes (Signed)
     Subjective: 1 Day Post-Op Procedure(s) (LRB): LEFT PATELLA TENDON REPAIR, RIGHT KNEE INJECTION. (Left)   Patient reports pain as mild, pain controlled. No reported events throughout the night. Dr. Alvan Dame discussed the procedure, findings and expectations moving forward. Bledsoe brace will be ordered today. Also discussed ordering home physical therapy and Occupational Therapy to see the patient is able to function safely at home. Patient is ready be discharged home today if she does well therapy. Patient is to call with any questions or concerns. Patient follow-up in the clinic in 2 weeks.    Objective:   VITALS:   Vitals:   06/04/19 0305 06/04/19 0601  BP:  133/75  Pulse: 80 68  Resp: 16 16  Temp:  (!) 97.5 F (36.4 C)  SpO2:  99%    Dorsiflexion/Plantar flexion intact Incision: dressing C/D/I No cellulitis present Compartment soft  LABS Recent Labs    06/03/19 1304 06/04/19 0311  HGB 10.2* 9.1*  HCT 31.2* 28.5*  WBC 5.9 3.4*  PLT 292 287    Recent Labs    06/03/19 1304 06/04/19 0311  NA 134* 134*  K 3.5 4.2  BUN 43* 31*  CREATININE 1.60* 1.14*  GLUCOSE 98 176*     Assessment/Plan: 1 Day Post-Op Procedure(s) (LRB): LEFT PATELLA TENDON REPAIR, RIGHT KNEE INJECTION. (Left) Foley cath d/c'ed Bledsoe brace ordered to be wore at all times and locked in extension Pt is WBAT on the left leg, but NO ROM Advance diet Up with therapy D/C IV fluids Discharge home with home health Follow up in 2 weeks at [facility] Follow up with OLIN,Shweta Aman D in 2 weeks.  Contact information:  [facility] [address], [address] [place] [phone]     Overweight (BMI 25-29.9) Estimated body mass index is 27.73 kg/m as calculated from the following:   Height as of this encounter: 5' (1.524 m).   Weight as of this encounter: 64.4 kg. Patient also counseled that weight may inhibit the healing process Patient counseled that losing  weight will help with future health issues      [place] Pugh. Pollie Poma   PAC  06/04/2019, 8:39 AM

## 2019-06-04 NOTE — Progress Notes (Signed)
Physical Therapy Treatment Patient Details Name: Kathleen Paul MRN: [id number] DOB: [birth year] Today's Date: 06/04/2019    History of Present Illness Pt s/p L patella tendon repair.  Pt with hx of L TKR 02/11/19    PT Comments    Pt cooperative and progressing well with mobility.  Pt's dtr present and pt up to bathroom and ambulating to hallway to practice stairs.   Follow Up Recommendations  Follow surgeon's recommendation for DC plan and follow-up therapies     Equipment Recommendations  None recommended by PT    Recommendations for Other Services       Precautions / Restrictions Precautions Precautions: Knee;Fall Precaution Comments: NO ROM L knee Required Braces or Orthoses: Knee Immobilizer - Left Knee Immobilizer - Left: On at all times Restrictions Weight Bearing Restrictions: No Other Position/Activity Restrictions: WBAT with KI in place    Mobility  Bed Mobility               General bed mobility comments: Pt up in chair and requests back to same  Transfers Overall transfer level: Needs assistance Equipment used: Rolling walker (2 wheeled) Transfers: Sit to/from Stand Sit to Stand: Min guard         General transfer comment: cues for LE management and use of UEs to self assist  Ambulation/Gait Ambulation/Gait assistance: Min guard;Supervision Gait Distance (Feet): 80 Feet Assistive device: Rolling walker (2 wheeled) Gait Pattern/deviations: Step-to pattern;Step-through pattern;Decreased step length - right;Decreased step length - left;Shuffle;Trunk flexed Gait velocity: decr   General Gait Details: cues for posture, position from RW and sequence   Stairs Stairs: Yes Stairs assistance: Min assist Stair Management: One rail Left;Step to pattern;Forwards;With cane Number of Stairs: 3 General stair comments: cues for sequence and foot/cane placement   Wheelchair Mobility    Modified Rankin (Stroke Patients Only)       Balance  Overall balance assessment: Mild deficits observed, not formally tested                                          Cognition Arousal/Alertness: Awake/alert Behavior During Therapy: WFL for tasks assessed/performed Overall Cognitive Status: Within Functional Limits for tasks assessed                                        Exercises      General Comments        Pertinent Vitals/Pain Pain Assessment: 0-10 Pain Score: 3  Pain Location: L knee Pain Descriptors / Indicators: Aching;Sore Pain Intervention(s): Premedicated before session;Monitored during session;Limited activity within patient's tolerance    Home Living                      Prior Function            PT Goals (current goals can now be found in the care plan section) Acute Rehab PT Goals Patient Stated Goal: Regain IND PT Goal Formulation: With patient Time For Goal Achievement: 06/11/19 Potential to Achieve Goals: Good Progress towards PT goals: Progressing toward goals    Frequency    7X/week      PT Plan Current plan remains appropriate    Co-evaluation              AM-PAC PT "6 Clicks" Mobility  Outcome Measure  Help needed turning from your back to your side while in a flat bed without using bedrails?: A Little Help needed moving from lying on your back to sitting on the side of a flat bed without using bedrails?: A Little Help needed moving to and from a bed to a chair (including a wheelchair)?: A Little Help needed standing up from a chair using your arms (e.g., wheelchair or bedside chair)?: A Little Help needed to walk in hospital room?: A Little Help needed climbing 3-5 steps with a railing? : A Little 6 Click Score: 18    End of Session Equipment Utilized During Treatment: Gait belt;Left knee immobilizer Activity Tolerance: Patient tolerated treatment well Patient left: in chair;with call bell/phone within reach;with chair alarm  set Nurse Communication: Mobility status PT Visit Diagnosis: Difficulty in walking, not elsewhere classified (R26.2)     Time: 1350-1409 PT Time Calculation (min) (ACUTE ONLY): 19 min  Charges:  $Gait Training: 8-22 mins                     [place] Pager [phone] Office [phone]    Traeton Bordas 06/04/2019, 5:07 PM

## 2019-06-04 NOTE — TOC Transition Note (Addendum)
Transition of Care [facility]) - CM/SW Discharge Note   Patient Details  Name: Kathleen Paul MRN: [id number] Date of Birth: 09-29-1935  [facility] CM/SW Contact:  Lia Hopping, [place] Phone Number: 06/04/2019, 1:09 PM   Clinical Narrative:    CSW met with the patient and her daughter at bedside to discuss home health pt options. CSW provided a list of H. Health options from Medicare. Gov. Patient and her daughter chose [facility] and [facility]. [facility], [facility], [facility] unable to accept due to staffing issues. [facility] will accept the patient and start services Saturday if staffing does not change. The home health agency will reach out to the patient to clarify the first visit. Daughter and the patient report understanding.  Patient has DME   Final next level of care: Home w Home Health Services Barriers to Discharge: Barriers Resolved   Patient Goals and CMS Choice   CMS Medicare.gov Compare Post Acute Care list provided to:: Patient Choice offered to / list presented to : Adult Children, Patient  Discharge Placement  Home                     Discharge Plan and Services                          HH Arranged: PT, OT [facility] Agency: [facility] Date [place]: 06/04/19 Time [place]: 3383 Representative spoke with at [place]: [place] ([place]) Interventions     Readmission Risk Interventions No flowsheet data found.

## 2019-06-08 DIAGNOSIS — Z9181 History of falling: Secondary | ICD-10-CM | POA: Diagnosis not present

## 2019-06-08 DIAGNOSIS — Z7982 Long term (current) use of aspirin: Secondary | ICD-10-CM | POA: Diagnosis not present

## 2019-06-08 DIAGNOSIS — T84023D Instability of internal left knee prosthesis, subsequent encounter: Secondary | ICD-10-CM | POA: Diagnosis not present

## 2019-06-08 DIAGNOSIS — Z7901 Long term (current) use of anticoagulants: Secondary | ICD-10-CM | POA: Diagnosis not present

## 2019-06-10 DIAGNOSIS — Z9181 History of falling: Secondary | ICD-10-CM | POA: Diagnosis not present

## 2019-06-10 DIAGNOSIS — Z7982 Long term (current) use of aspirin: Secondary | ICD-10-CM | POA: Diagnosis not present

## 2019-06-10 DIAGNOSIS — Z7901 Long term (current) use of anticoagulants: Secondary | ICD-10-CM | POA: Diagnosis not present

## 2019-06-10 DIAGNOSIS — T84023D Instability of internal left knee prosthesis, subsequent encounter: Secondary | ICD-10-CM | POA: Diagnosis not present

## 2019-06-10 NOTE — Discharge Summary (Signed)
Physician Discharge Summary  Patient ID: Kathleen Paul MRN: [id number] DOB/AGE: 1935-08-21 83 y.o.  Admit date: 06/03/2019 Discharge date: 06/04/2019   Procedures:  Procedure(s) (LRB): LEFT PATELLA TENDON REPAIR, RIGHT KNEE INJECTION. (Left)  Attending Physician:  Dr. Paralee Cancel   Admission Diagnoses:   Rupture of left patella tendon s/p TKA  Discharge Diagnoses:  Principal Problem:   Rupture of left patellar tendon Active Problems:   Overweight (BMI 25.0-29.9)  Past Medical History:  Diagnosis Date  . Anxiety state, unspecified   . Arthritis   . Atherosclerosis   . Diverticulosis   . Esophageal stricture   . Essential hypertension, benign   . Gastric ulcer   . Hiatal hernia   . History of DVT (deep vein thrombosis) yrs ago   legs  . IDA (iron deficiency anemia)   . Inguinal hernia without mention of obstruction or gangrene, unilateral or unspecified, (not specified as recurrent)    2014  . Internal hemorrhoids   . Mild mitral regurgitation    a. 04/2010 Echo: nl EF, mild diast dysfxn, trace PR, mild TR, mild-mod MR.  . Non-obstructive CAD    a. 04/2010 Myoview: EF 77%, fixed apical/inferior defect;  b. 04/2010 Cath: LAD 56m, LCX/RCA minor irregs, EF 60%;  c. 04/2011 Myoview: low risk.  . Pure hypercholesterolemia   . Rupture of left patellar tendon    05-26-2019  . Scoliosis   . UI (urinary incontinence)   . Umbilical hernia without mention of obstruction or gangrene    2014  . Unspecified hypothyroidism   . Vitamin D deficiency     HPI:    Pt is a 83 y.o. female complaining of left knee pain for 1 week.  She is 3.5 months out from a left TKA, preformed on 02/11/2019 per Dr. Alvan Dame.  Pain has continued since the incident 1 week ago.. Patient stated that she had talked Dr. Alvan Dame about doing her right total knee and realized she had not strengthened her left knee enough to do stairs. In attempt to use the stairs leading with the left leg she felt a pop with  excruciating pain. Since that time she is unable to fully extend her leg as well as having severe pain with range of motion and weightbearing.  X-rays in the clinic show previous TKA with patella alta.  An MRI was obtained to review the extent of damage to the patella tendon.  Various options are discussed with the patient. Risks, benefits and expectations were discussed with the patient. Patient understand the risks, benefits and expectations and wishes to proceed with surgery.   PCP: [name], NP   Discharged Condition: good  Hospital Course:  Patient underwent the above stated procedure on 06/03/2019. Patient tolerated the procedure well and brought to the recovery room in good condition and subsequently to the floor.  POD #1 BP: 133/75 ; Pulse: 68 ; Temp: 98.5 F (36.4 C) ; Resp: 16 Patient reports pain as mild, pain controlled. No reported events throughout the night. Dr. Alvan Dame discussed the procedure, findings and expectations moving forward. Bledsoe brace will be ordered today. Also discussed ordering home physical therapy and Occupational Therapy to see the patient is able to function safely at home. Patient is ready be discharged home. Dorsiflexion/plantar flexion intact, incision: dressing C/D/I, no cellulitis present and compartment soft.   LABS  Basename    HGB     9.1  HCT     28.5    Discharge Exam: General  appearance: alert, cooperative and no distress Extremities: Homans sign is negative, no sign of DVT, no edema, redness or tenderness in the calves or thighs and no ulcers, gangrene or trophic changes  Disposition:  Home with follow up in 2 weeks   Follow-up Information    Paralee Cancel, MD. Schedule an appointment as soon as possible for a visit in 2 weeks.   Specialty: Orthopedic Surgery Contact information: [address] [place] [address] [place] [address] [id number]        [place]. Call.   Contact information: Address: [address] [place], [place] [address] Phone: [phone]          Discharge Instructions    Call MD / Call 911   Complete by: As directed    If you experience chest pain or shortness of breath, CALL 911 and be transported to the hospital emergency room.  If you develope a fever above 101 F, pus (white drainage) or increased drainage or redness at the wound, or calf pain, call your surgeon's office.   Constipation Prevention   Complete by: As directed    Drink plenty of fluids.  Prune juice may be helpful.  You may use a stool softener, such as Colace (over the counter) 100 mg twice a day.  Use MiraLax (over the counter) for constipation as needed.   Diet - low sodium heart healthy   Complete by: As directed    Discharge instructions   Complete by: As directed    Maintain surgical dressing until follow up in the clinic. If the edges start to pull up, may reinforce with tape. If the dressing is no longer working, may remove and cover with gauze and tape, but must keep the area dry and clean.  Follow up in 2 weeks at [facility]. Call with any questions or concerns.   Increase activity slowly as tolerated   Complete by: As directed    Weight bearing as tolerated with assist device (walker, cane, etc) as directed, NO range of motion.  Must use Bledsoe Brace locked in extension at all times, other than to wash the area.      Allergies as of 06/04/2019      Reactions   Codeine Nausea Only      Medication List    STOP taking these medications   aspirin EC 81 MG tablet Replaced by: aspirin 81 MG chewable tablet   HYDROcodone-acetaminophen 7.5-325 MG tablet Commonly known as: Norco   traMADol 50 MG tablet Commonly known as: ULTRAM     TAKE these medications   acetaminophen 500 MG tablet Commonly known as: TYLENOL Take 500 mg by mouth every 6 (six) hours as needed for moderate pain.   aspirin 81 MG chewable tablet Commonly known as: Aspirin Childrens Chew 1 tablet (81  mg total) by mouth 2 (two) times daily. Start the day after finishing Xarelto.  Take for 4 weeks, then resume regular dose. Start taking on: June 19, 2019 Replaces: aspirin EC 81 MG tablet   CALCIUM 1200+D3 PO Take 1 tablet by mouth daily.   docusate sodium 100 MG capsule Commonly known as: Colace Take 1 capsule (100 mg total) by mouth 2 (two) times daily.   enalapril-hydrochlorothiazide 10-25 MG tablet Commonly known as: VASERETIC Take 1 tablet by mouth daily.   ferrous sulfate 325 (65 FE) MG tablet Commonly known as: FerrouSul Take 1 tablet (325 mg total) by mouth 3 (three) times daily with meals for 14 days.  HYDROmorphone 2 MG tablet Commonly known as: Dilaudid Take 1-2 tablets (2-4 mg total) by mouth every 6 (six) hours as needed for severe pain.   levothyroxine 88 MCG tablet Commonly known as: SYNTHROID TAKE 1 TABLET BY MOUTH EVERY MORNING ON AN EMPTY STOMACH AND WITH A FULL GLASS OF WATER What changed: See the new instructions.   LUBRICATING EYE DROPS OP Place 1 drop into both eyes daily as needed (dry eyes).   methocarbamol 500 MG tablet Commonly known as: Robaxin Take 1 tablet (500 mg total) by mouth every 6 (six) hours as needed for muscle spasms.   metoprolol tartrate 25 MG tablet Commonly known as: LOPRESSOR TAKE 1 TABLET BY MOUTH EVERY EVENING What changed:   how much to take  how to take this  when to take this  additional instructions   MULTIVITAMIN PO Take 1 tablet by mouth daily.   omeprazole 40 MG capsule Commonly known as: PRILOSEC Take 1 capsule (40 mg total) by mouth 2 (two) times daily before a meal. What changed: when to take this   OVER THE COUNTER MEDICATION Apply 1 application topically 2 (two) times daily as needed (for pain). OxyRub Cream   polyethylene glycol 17 g packet Commonly known as: MIRALAX / GLYCOLAX Take 17 g by mouth 2 (two) times daily.   rivaroxaban 10 MG Tabs tablet Commonly known as: Xarelto Take 1 tablet  (10 mg total) by mouth daily for 14 days.   simvastatin 20 MG tablet Commonly known as: ZOCOR TAKE 1 TABLET BY MOUTH EVERY DAY FOR CHOLESTEROL What changed:   how much to take  how to take this  when to take this  additional instructions        Signed: [place]. Neil Errickson   PA-C  06/10/2019, 8:25 AM

## 2019-06-16 NOTE — Telephone Encounter (Signed)
Kathleen Paul, what is this in reference to? She is probably due for CPE but I don't see anything scheduled.  If labs are requested by her orthopedist then they will give them instructions on lab draw.

## 2019-06-18 DIAGNOSIS — M25561 Pain in right knee: Secondary | ICD-10-CM | POA: Diagnosis not present

## 2019-06-18 DIAGNOSIS — Z4789 Encounter for other orthopedic aftercare: Secondary | ICD-10-CM | POA: Diagnosis not present

## 2019-06-18 DIAGNOSIS — M66862 Spontaneous rupture of other tendons, left lower leg: Secondary | ICD-10-CM | POA: Diagnosis not present

## 2019-06-18 DIAGNOSIS — M1711 Unilateral primary osteoarthritis, right knee: Secondary | ICD-10-CM | POA: Diagnosis not present

## 2019-06-19 NOTE — Progress Notes (Signed)
Called [place], [place] for Dr. Alvan Dame as patient's son does not want to schedule a pre-op appointment or Covid test appointment until he talks to Dr. Alvan Dame about the surgery.

## 2019-06-20 ENCOUNTER — Encounter (HOSPITAL_COMMUNITY): Payer: PPO | Attending: Primary Care

## 2019-06-20 ENCOUNTER — Other Ambulatory Visit (HOSPITAL_COMMUNITY): Payer: PPO

## 2019-06-23 NOTE — Progress Notes (Signed)
Called patient's son, Jenny Reichmann and informed him that he needs to take his mother today by 3 pm to get Covid test done for her surgery tomorrow. John, son states that he cannot take her to get her Covid as he is at work and he is in [place] and she is in [place], [place]. John states that even if he got one of his siblings to go at her house, it takes two people to get her in the car. John states that this was the problem on Friday 06/20/2019 as he could not bring her to [facility] for her pre-op appointment or take her to get her Covid appointment. Called Dr. Aurea Graff office and Dr. Alvan Dame and Adrian Prince, PA have left the office for the day.LM on VM for [facility], [facility] to call me back.

## 2019-06-26 NOTE — Patient Instructions (Signed)
DUE TO COVID-19 ONLY ONE VISITOR IS ALLOWED TO COME WITH YOU AND STAY IN THE WAITING ROOM ONLY DURING PRE OP AND PROCEDURE DAY OF SURGERY. THE 1 VISITOR MAY VISIT WITH YOU AFTER SURGERY IN YOUR PRIVATE ROOM DURING VISITING HOURS ONLY!  YOU NEED TO HAVE A COVID 19 TEST ON: 06/27/2019, THIS TEST MUST BE DONE BEFORE SURGERY, COME  [place], [place] , [address].  ([place]) ONCE YOUR COVID TEST IS COMPLETED, PLEASE BEGIN THE QUARANTINE INSTRUCTIONS AS OUTLINED IN YOUR HANDOUT.                [name]    Your procedure is scheduled on: 07/01/2019   Report to [facility] Main  Entrance   Report to admitting at : 12:25 PM     Call this number if you have problems the morning of surgery [phone]    Remember: Do not eat food or drink liquids :After Midnight. BRUSH YOUR TEETH MORNING OF SURGERY AND RINSE YOUR MOUTH OUT, NO CHEWING GUM CANDY OR MINTS.     Take these medicines the morning of surgery with A SIP OF WATER: Synthroid,Metoprolol,Prilosec,Dilaudid as needed,Tylenol as needed.                                 You may not have any metal on your body including hair pins and              piercings  Do not wear jewelry, make-up, lotions, powders or perfumes, deodorant             Do not wear nail polish on your fingernails.  Do not shave  48 hours prior to surgery.                 Do not bring valuables to the hospital. [place].  Contacts, dentures or bridgework may not be worn into surgery.  Leave suitcase in the car. After surgery it may be brought to your room.                 NO SOLID FOOD AFTER MIDNIGHT THE NIGHT PRIOR TO SURGERY. NOTHING BY MOUTH EXCEPT CLEAR LIQUIDS UNTIL : 11:25 AM. PLEASE FINISH ENSURE DRINK PER SURGEON ORDER  WHICH NEEDS TO BE COMPLETED AT : 11:25 AM   CLEAR LIQUID DIET   Foods Allowed                                                                      Foods Excluded  Coffee and tea, regular and decaf                             liquids that you cannot  Plain Jell-O any favor except red or purple                                           see through  such as: Fruit ices (not with fruit pulp)                                     milk, soups, orange juice  Iced Popsicles                                    All solid food Carbonated beverages, regular and diet                                    Cranberry, grape and apple juices Sports drinks like Gatorade Lightly seasoned clear broth or consume(fat free) Sugar, honey syrup  Sample Menu Breakfast                                Lunch                                     Supper Cranberry juice                    Beef broth                            Chicken broth Jell-O                                     Grape juice                           Apple juice Coffee or tea                        Jell-O                                      Popsicle                                                Coffee or tea                        Coffee or tea  _____________________________________________________________________  [facility] - Preparing for Surgery Before surgery, you can play an important role.  Because skin is not sterile, your skin needs to be as free of germs as possible.  You can reduce the number of germs on your skin by washing with CHG (chlorahexidine gluconate) soap before surgery.  CHG is an antiseptic cleaner which kills germs and bonds with the skin to continue killing germs even after washing. Please DO NOT use if you have an allergy to CHG or antibacterial soaps.  If your skin becomes reddened/irritated stop using the CHG and inform your nurse when you arrive at Short Stay. Do not shave (including legs and underarms) for at least  48 hours prior to the first CHG shower.  You may shave your face/neck. Please follow these instructions carefully:  1.  Shower with CHG Soap the night before  surgery and the  morning of Surgery.  2.  If you choose to wash your hair, wash your hair first as usual with your  normal  shampoo.  3.  After you shampoo, rinse your hair and body thoroughly to remove the  shampoo.                           4.  Use CHG as you would any other liquid soap.  You can apply chg directly  to the skin and wash                       Gently with a scrungie or clean washcloth.  5.  Apply the CHG Soap to your body ONLY FROM THE NECK DOWN.   Do not use on face/ open                           Wound or open sores. Avoid contact with eyes, ears mouth and genitals (private parts).                       Wash face,  Genitals (private parts) with your normal soap.             6.  Wash thoroughly, paying special attention to the area where your surgery  will be performed.  7.  Thoroughly rinse your body with warm water from the neck down.  8.  DO NOT shower/wash with your normal soap after using and rinsing off  the CHG Soap.                9.  Pat yourself dry with a clean towel.            10.  Wear clean pajamas.            11.  Place clean sheets on your bed the night of your first shower and do not  sleep with pets. Day of Surgery : Do not apply any lotions/deodorants the morning of surgery.  Please wear clean clothes to the hospital/surgery center.  FAILURE TO FOLLOW THESE INSTRUCTIONS MAY RESULT IN THE CANCELLATION OF YOUR SURGERY PATIENT SIGNATURE_________________________________  NURSE SIGNATURE__________________________________  __  Incentive Spirometer  An incentive spirometer is a tool that can help keep your lungs clear and active. This tool measures how well you are filling your lungs with each breath. Taking long deep breaths may help reverse or decrease the chance of developing breathing (pulmonary) problems (especially infection) following:  A long period of time when you are unable to move or be active. BEFORE THE PROCEDURE   If the spirometer includes  an indicator to show your best effort, your nurse or respiratory therapist will set it to a desired goal.  If possible, sit up straight or lean slightly forward. Try not to slouch.  Hold the incentive spirometer in an upright position. INSTRUCTIONS FOR USE  1. Sit on the edge of your bed if possible, or sit up as far as you can in bed or on a chair. 2. Hold the incentive spirometer in an upright position. 3. Breathe out normally. 4. Place the mouthpiece in your mouth and seal your lips tightly around it. 5.  Breathe in slowly and as deeply as possible, raising the piston or the ball toward the top of the column. 6. Hold your breath for 3-5 seconds or for as long as possible. Allow the piston or ball to fall to the bottom of the column. 7. Remove the mouthpiece from your mouth and breathe out normally. 8. Rest for a few seconds and repeat Steps 1 through 7 at least 10 times every 1-2 hours when you are awake. Take your time and take a few normal breaths between deep breaths. 9. The spirometer may include an indicator to show your best effort. Use the indicator as a goal to work toward during each repetition. 10. After each set of 10 deep breaths, practice coughing to be sure your lungs are clear. If you have an incision (the cut made at the time of surgery), support your incision when coughing by placing a pillow or rolled up towels firmly against it. Once you are able to get out of bed, walk around indoors and cough well. You may stop using the incentive spirometer when instructed by your caregiver.  RISKS AND COMPLICATIONS  Take your time so you do not get dizzy or light-headed.  If you are in pain, you may need to take or ask for pain medication before doing incentive spirometry. It is harder to take a deep breath if you are having pain. AFTER USE  Rest and breathe slowly and easily.  It can be helpful to keep track of a log of your progress. Your caregiver can provide you with a simple  table to help with this. If you are using the spirometer at home, follow these instructions: [place] IF:   You are having difficultly using the spirometer.  You have trouble using the spirometer as often as instructed.  Your pain medication is not giving enough relief while using the spirometer.  You develop fever of 100.5 F (38.1 C) or higher. SEEK IMMEDIATE MEDICAL CARE IF:   You cough up bloody sputum that had not been present before.  You develop fever of 102 F (38.9 C) or greater.  You develop worsening pain at or near the incision site. MAKE SURE YOU:   Understand these instructions.  Will watch your condition.  Will get help right away if you are not doing well or get worse. Document Released: 11/06/2006 Document Revised: 09/18/2011 Document Reviewed: 01/07/2007 [facility] Patient Information 2014 [employer], [place].   ________________________________________________________________________ ______________________________________________________________________

## 2019-06-27 ENCOUNTER — Encounter (HOSPITAL_COMMUNITY): Payer: Self-pay

## 2019-06-27 ENCOUNTER — Other Ambulatory Visit (HOSPITAL_COMMUNITY)
Admission: RE | Admit: 2019-06-27 | Discharge: 2019-06-27 | Disposition: A | Discharge status: Home / Self Care | Payer: PPO | Source: Ambulatory Visit | Attending: Orthopedic Surgery | Admitting: Orthopedic Surgery

## 2019-06-27 ENCOUNTER — Encounter (HOSPITAL_COMMUNITY)
Admission: RE | Admit: 2019-06-27 | Discharge: 2019-06-27 | Disposition: A | Discharge status: Home / Self Care | Payer: PPO | Source: Ambulatory Visit | Attending: Orthopedic Surgery | Admitting: Orthopedic Surgery

## 2019-06-27 ENCOUNTER — Other Ambulatory Visit: Payer: Self-pay

## 2019-06-27 DIAGNOSIS — Z01812 Encounter for preprocedural laboratory examination: Secondary | ICD-10-CM | POA: Insufficient documentation

## 2019-06-27 DIAGNOSIS — Z20828 Contact with and (suspected) exposure to other viral communicable diseases: Secondary | ICD-10-CM | POA: Diagnosis not present

## 2019-06-27 DIAGNOSIS — S76112A Strain of left quadriceps muscle, fascia and tendon, initial encounter: Secondary | ICD-10-CM | POA: Diagnosis not present

## 2019-06-27 HISTORY — DX: Malignant (primary) neoplasm, unspecified: C80.1

## 2019-06-27 LAB — CBC
HCT: 32.4 % — ABNORMAL LOW (ref 36.0–46.0)
Hemoglobin: 10.4 g/dL — ABNORMAL LOW (ref 12.0–15.0)
MCH: 31 pg (ref 26.0–34.0)
MCHC: 32.1 g/dL (ref 30.0–36.0)
MCV: 96.7 fL (ref 80.0–100.0)
Platelets: 282 10*3/uL (ref 150–400)
RBC: 3.35 MIL/uL — ABNORMAL LOW (ref 3.87–5.11)
RDW: 16.2 % — ABNORMAL HIGH (ref 11.5–15.5)
WBC: 6.3 10*3/uL (ref 4.0–10.5)
nRBC: 0 % (ref 0.0–0.2)

## 2019-06-27 LAB — BASIC METABOLIC PANEL
Anion gap: 12 (ref 5–15)
BUN: 50 mg/dL — ABNORMAL HIGH (ref 8–23)
CO2: 24 mmol/L (ref 22–32)
Calcium: 9.1 mg/dL (ref 8.9–10.3)
Chloride: 97 mmol/L — ABNORMAL LOW (ref 98–111)
Creatinine, Ser: 1.08 mg/dL — ABNORMAL HIGH (ref 0.44–1.00)
GFR calc Af Amer: 56 mL/min — ABNORMAL LOW (ref >60)
GFR calc non Af Amer: 48 mL/min — ABNORMAL LOW (ref >60)
Glucose, Bld: 103 mg/dL — ABNORMAL HIGH (ref 70–99)
Potassium: 3.9 mmol/L (ref 3.5–5.1)
Sodium: 133 mmol/L — ABNORMAL LOW (ref 135–145)

## 2019-06-27 LAB — SURGICAL PCR SCREEN
MRSA, PCR: NEGATIVE
Staphylococcus aureus: NEGATIVE

## 2019-06-27 NOTE — Progress Notes (Signed)
PCP - Lowry Bowl. Cardiologist - Dorris Carnes.LOV: 01/30/2019 Nephrologist: Dr. Joni Fears Chest x-ray -  EKG - 01/30/19. EPIC Stress Test -  ECHO -  Cardiac Cath -   Sleep Study - N/A CPAP -   Fasting Blood Sugar -  Checks Blood Sugar _____ times a day  Blood Thinner Instructions:Pt. Confirmed she stop taking Xarelto on 06/19/19 Aspirin Instructions:RN advised pt. To call Dr.Olin's office to confirmed when she should stop taking Aspirin. Last Dose:  Anesthesia review:   Patient denies shortness of breath, fever, cough and chest pain at PAT appointment   Patient verbalized understanding of instructions that were given to them at the PAT appointment. Patient was also instructed that they will need to review over the PAT instructions again at home before surgery.

## 2019-06-28 LAB — NOVEL CORONAVIRUS, NAA (HOSP ORDER, SEND-OUT TO REF LAB; TAT 18-24 HRS): SARS-CoV-2, NAA: NOT DETECTED

## 2019-07-01 ENCOUNTER — Telehealth (HOSPITAL_COMMUNITY): Payer: Self-pay | Admitting: *Deleted

## 2019-07-01 ENCOUNTER — Encounter (HOSPITAL_COMMUNITY): Payer: Self-pay | Admitting: Orthopedic Surgery

## 2019-07-01 ENCOUNTER — Inpatient Hospital Stay (HOSPITAL_COMMUNITY): Payer: PPO | Admitting: Physician Assistant

## 2019-07-01 ENCOUNTER — Encounter (HOSPITAL_COMMUNITY)
Admission: RE | Disposition: A | Discharge status: Home Health | Payer: Self-pay | Source: Home / Self Care | Attending: Orthopedic Surgery

## 2019-07-01 ENCOUNTER — Inpatient Hospital Stay (HOSPITAL_COMMUNITY): Payer: PPO | Admitting: Registered Nurse

## 2019-07-01 ENCOUNTER — Other Ambulatory Visit: Payer: Self-pay

## 2019-07-01 ENCOUNTER — Inpatient Hospital Stay (HOSPITAL_COMMUNITY)
Admission: RE | Admit: 2019-07-01 | Discharge: 2019-07-02 | DRG: 909 | Disposition: A | Discharge status: Home Health | Payer: PPO | Attending: Orthopedic Surgery | Admitting: Orthopedic Surgery

## 2019-07-01 DIAGNOSIS — G8918 Other acute postprocedural pain: Secondary | ICD-10-CM | POA: Diagnosis not present

## 2019-07-01 DIAGNOSIS — M1990 Unspecified osteoarthritis, unspecified site: Secondary | ICD-10-CM | POA: Diagnosis present

## 2019-07-01 DIAGNOSIS — X509XXA Other and unspecified overexertion or strenuous movements or postures, initial encounter: Secondary | ICD-10-CM | POA: Diagnosis not present

## 2019-07-01 DIAGNOSIS — I10 Essential (primary) hypertension: Secondary | ICD-10-CM | POA: Diagnosis present

## 2019-07-01 DIAGNOSIS — Y93B9 Activity, other involving muscle strengthening exercises: Secondary | ICD-10-CM

## 2019-07-01 DIAGNOSIS — Z885 Allergy status to narcotic agent status: Secondary | ICD-10-CM | POA: Diagnosis not present

## 2019-07-01 DIAGNOSIS — Z6827 Body mass index (BMI) 27.0-27.9, adult: Secondary | ICD-10-CM | POA: Diagnosis not present

## 2019-07-01 DIAGNOSIS — E039 Hypothyroidism, unspecified: Secondary | ICD-10-CM | POA: Diagnosis not present

## 2019-07-01 DIAGNOSIS — Z96652 Presence of left artificial knee joint: Secondary | ICD-10-CM | POA: Diagnosis present

## 2019-07-01 DIAGNOSIS — Z85828 Personal history of other malignant neoplasm of skin: Secondary | ICD-10-CM

## 2019-07-01 DIAGNOSIS — Z79890 Hormone replacement therapy (postmenopausal): Secondary | ICD-10-CM | POA: Diagnosis not present

## 2019-07-01 DIAGNOSIS — K5790 Diverticulosis of intestine, part unspecified, without perforation or abscess without bleeding: Secondary | ICD-10-CM | POA: Diagnosis not present

## 2019-07-01 DIAGNOSIS — Z86718 Personal history of other venous thrombosis and embolism: Secondary | ICD-10-CM | POA: Diagnosis not present

## 2019-07-01 DIAGNOSIS — E7800 Pure hypercholesterolemia, unspecified: Secondary | ICD-10-CM | POA: Diagnosis present

## 2019-07-01 DIAGNOSIS — M419 Scoliosis, unspecified: Secondary | ICD-10-CM | POA: Diagnosis present

## 2019-07-01 DIAGNOSIS — Z87891 Personal history of nicotine dependence: Secondary | ICD-10-CM

## 2019-07-01 DIAGNOSIS — E559 Vitamin D deficiency, unspecified: Secondary | ICD-10-CM | POA: Diagnosis present

## 2019-07-01 DIAGNOSIS — Z7982 Long term (current) use of aspirin: Secondary | ICD-10-CM | POA: Diagnosis not present

## 2019-07-01 DIAGNOSIS — S76192A Other specified injury of left quadriceps muscle, fascia and tendon, initial encounter: Principal | ICD-10-CM | POA: Diagnosis present

## 2019-07-01 DIAGNOSIS — E663 Overweight: Secondary | ICD-10-CM | POA: Diagnosis not present

## 2019-07-01 DIAGNOSIS — S86812A Strain of other muscle(s) and tendon(s) at lower leg level, left leg, initial encounter: Secondary | ICD-10-CM | POA: Diagnosis not present

## 2019-07-01 DIAGNOSIS — M1712 Unilateral primary osteoarthritis, left knee: Secondary | ICD-10-CM | POA: Diagnosis not present

## 2019-07-01 DIAGNOSIS — K449 Diaphragmatic hernia without obstruction or gangrene: Secondary | ICD-10-CM | POA: Diagnosis present

## 2019-07-01 DIAGNOSIS — S86812D Strain of other muscle(s) and tendon(s) at lower leg level, left leg, subsequent encounter: Secondary | ICD-10-CM

## 2019-07-01 DIAGNOSIS — Z20828 Contact with and (suspected) exposure to other viral communicable diseases: Secondary | ICD-10-CM | POA: Diagnosis not present

## 2019-07-01 DIAGNOSIS — S76192D Other specified injury of left quadriceps muscle, fascia and tendon, subsequent encounter: Secondary | ICD-10-CM | POA: Diagnosis not present

## 2019-07-01 DIAGNOSIS — M66862 Spontaneous rupture of other tendons, left lower leg: Secondary | ICD-10-CM | POA: Diagnosis not present

## 2019-07-01 HISTORY — PX: PATELLAR TENDON REPAIR: SHX737

## 2019-07-01 LAB — TYPE AND SCREEN
ABO/RH(D): B POS
Antibody Screen: NEGATIVE

## 2019-07-01 SURGERY — REPAIR, TENDON, PATELLAR
Anesthesia: Spinal | Site: Knee | Laterality: Left

## 2019-07-01 MED ORDER — PROMETHAZINE HCL 25 MG/ML IJ SOLN: 6.2500 mg | INTRAMUSCULAR | Status: DC | PRN

## 2019-07-01 MED ORDER — HYDROMORPHONE HCL 1 MG/ML IJ SOLN: 0.2500 mg | INTRAMUSCULAR | Status: DC | PRN

## 2019-07-01 MED ORDER — HYDROMORPHONE HCL 1 MG/ML IJ SOLN: 0.5000 mg | INTRAMUSCULAR | Status: DC | PRN

## 2019-07-01 MED ORDER — OXYCODONE HCL 5 MG PO TABS: 5.0000 mg | ORAL_TABLET | Freq: Once | ORAL | Status: DC | PRN

## 2019-07-01 MED ORDER — ONDANSETRON HCL 4 MG PO TABS: 4.0000 mg | ORAL_TABLET | Freq: Four times a day (QID) | ORAL | Status: DC | PRN

## 2019-07-01 MED ORDER — LEVOTHYROXINE SODIUM 88 MCG PO TABS
88.0000 ug | ORAL_TABLET | Freq: Every day | ORAL | Status: DC
  Administered 2019-07-02: 88 ug via ORAL
  Filled 2019-07-01: qty 1

## 2019-07-01 MED ORDER — OXYCODONE HCL 5 MG/5ML PO SOLN: 5.0000 mg | Freq: Once | ORAL | Status: DC | PRN

## 2019-07-01 MED ORDER — ALUM & MAG HYDROXIDE-SIMETH 200-200-20 MG/5ML PO SUSP: 15.0000 mL | ORAL | Status: DC | PRN

## 2019-07-01 MED ORDER — BISACODYL 10 MG RE SUPP: 10.0000 mg | Freq: Every day | RECTAL | Status: DC | PRN

## 2019-07-01 MED ORDER — ROPIVACAINE HCL 5 MG/ML IJ SOLN
INTRAMUSCULAR | Status: DC | PRN
  Administered 2019-07-01: 30 mL via PERINEURAL

## 2019-07-01 MED ORDER — DOCUSATE SODIUM 100 MG PO CAPS
100.0000 mg | ORAL_CAPSULE | Freq: Two times a day (BID) | ORAL | Status: DC
  Administered 2019-07-01 – 2019-07-02 (×2): 100 mg via ORAL
  Filled 2019-07-01 (×2): qty 1

## 2019-07-01 MED ORDER — MAGNESIUM CITRATE PO SOLN: 1.0000 | Freq: Once | ORAL | Status: DC | PRN

## 2019-07-01 MED ORDER — TRANEXAMIC ACID-NACL 1000-0.7 MG/100ML-% IV SOLN
1000.0000 mg | Freq: Once | INTRAVENOUS | Status: AC
  Administered 2019-07-01: 1000 mg via INTRAVENOUS
  Filled 2019-07-01: qty 100

## 2019-07-01 MED ORDER — PHENYLEPHRINE HCL (PRESSORS) 10 MG/ML IV SOLN
INTRAVENOUS | Status: AC
  Filled 2019-07-01: qty 1

## 2019-07-01 MED ORDER — RIVAROXABAN 10 MG PO TABS
10.0000 mg | ORAL_TABLET | Freq: Every day | ORAL | Status: DC
  Administered 2019-07-02: 10:00:00 10 mg via ORAL
  Filled 2019-07-01: qty 1

## 2019-07-01 MED ORDER — PROPOFOL 10 MG/ML IV BOLUS
INTRAVENOUS | Status: DC | PRN
  Administered 2019-07-01 (×3): 20 mg via INTRAVENOUS

## 2019-07-01 MED ORDER — PROPOFOL 500 MG/50ML IV EMUL
INTRAVENOUS | Status: DC | PRN
  Administered 2019-07-01: 75 ug/kg/min via INTRAVENOUS

## 2019-07-01 MED ORDER — DEXAMETHASONE SODIUM PHOSPHATE 10 MG/ML IJ SOLN
10.0000 mg | Freq: Once | INTRAMUSCULAR | Status: AC
  Administered 2019-07-02: 10:00:00 10 mg via INTRAVENOUS
  Filled 2019-07-01: qty 1

## 2019-07-01 MED ORDER — HYDROCHLOROTHIAZIDE 25 MG PO TABS
25.0000 mg | ORAL_TABLET | Freq: Every day | ORAL | Status: DC
  Administered 2019-07-02: 25 mg via ORAL
  Filled 2019-07-01: qty 1

## 2019-07-01 MED ORDER — MIDAZOLAM HCL 5 MG/5ML IJ SOLN
INTRAMUSCULAR | Status: DC | PRN
  Administered 2019-07-01: 1 mg via INTRAVENOUS

## 2019-07-01 MED ORDER — FENTANYL CITRATE (PF) 100 MCG/2ML IJ SOLN
INTRAMUSCULAR | Status: DC | PRN
  Administered 2019-07-01: 50 ug via INTRAVENOUS

## 2019-07-01 MED ORDER — DIPHENHYDRAMINE HCL 12.5 MG/5ML PO ELIX: 12.5000 mg | ORAL_SOLUTION | ORAL | Status: DC | PRN

## 2019-07-01 MED ORDER — MIDAZOLAM HCL 2 MG/2ML IJ SOLN
1.0000 mg | INTRAMUSCULAR | Status: DC
  Filled 2019-07-01: qty 2

## 2019-07-01 MED ORDER — KETOROLAC TROMETHAMINE 30 MG/ML IJ SOLN
INTRAMUSCULAR | Status: AC
  Filled 2019-07-01: qty 1

## 2019-07-01 MED ORDER — FENTANYL CITRATE (PF) 100 MCG/2ML IJ SOLN
INTRAMUSCULAR | Status: AC
  Filled 2019-07-01: qty 2

## 2019-07-01 MED ORDER — PHENOL 1.4 % MT LIQD
1.0000 | OROMUCOSAL | Status: DC | PRN
  Filled 2019-07-01: qty 177

## 2019-07-01 MED ORDER — METHOCARBAMOL 500 MG PO TABS
500.0000 mg | ORAL_TABLET | Freq: Four times a day (QID) | ORAL | Status: DC | PRN
  Administered 2019-07-01 – 2019-07-02 (×2): 500 mg via ORAL
  Filled 2019-07-01 (×2): qty 1

## 2019-07-01 MED ORDER — MIDAZOLAM HCL 2 MG/2ML IJ SOLN
INTRAMUSCULAR | Status: AC
  Filled 2019-07-01: qty 2

## 2019-07-01 MED ORDER — METOCLOPRAMIDE HCL 5 MG PO TABS: 5.0000 mg | ORAL_TABLET | Freq: Three times a day (TID) | ORAL | Status: DC | PRN

## 2019-07-01 MED ORDER — POVIDONE-IODINE 10 % EX SWAB
2.0000 "application " | Freq: Once | CUTANEOUS | Status: AC
  Administered 2019-07-01: 2 via TOPICAL

## 2019-07-01 MED ORDER — METHOCARBAMOL 500 MG IVPB - SIMPLE MED
500.0000 mg | Freq: Four times a day (QID) | INTRAVENOUS | Status: DC | PRN
  Filled 2019-07-01: qty 50

## 2019-07-01 MED ORDER — CHLORHEXIDINE GLUCONATE 4 % EX LIQD
60.0000 mL | Freq: Once | CUTANEOUS | Status: AC
  Administered 2019-07-01: 4 via TOPICAL

## 2019-07-01 MED ORDER — DEXAMETHASONE SODIUM PHOSPHATE 10 MG/ML IJ SOLN
10.0000 mg | Freq: Once | INTRAMUSCULAR | Status: AC
  Administered 2019-07-01: 6 mg via INTRAVENOUS

## 2019-07-01 MED ORDER — CEFAZOLIN SODIUM-DEXTROSE 2-4 GM/100ML-% IV SOLN
2.0000 g | Freq: Four times a day (QID) | INTRAVENOUS | Status: AC
  Administered 2019-07-01 – 2019-07-02 (×2): 2 g via INTRAVENOUS
  Filled 2019-07-01 (×2): qty 100

## 2019-07-01 MED ORDER — FERROUS SULFATE 325 (65 FE) MG PO TABS
325.0000 mg | ORAL_TABLET | Freq: Two times a day (BID) | ORAL | Status: DC
  Administered 2019-07-01 – 2019-07-02 (×2): 325 mg via ORAL
  Filled 2019-07-01 (×2): qty 1

## 2019-07-01 MED ORDER — HYDROMORPHONE HCL 2 MG PO TABS
1.0000 mg | ORAL_TABLET | ORAL | Status: DC | PRN
  Administered 2019-07-01 – 2019-07-02 (×2): 2 mg via ORAL
  Filled 2019-07-01 (×2): qty 1

## 2019-07-01 MED ORDER — ONDANSETRON HCL 4 MG/2ML IJ SOLN
INTRAMUSCULAR | Status: AC
  Filled 2019-07-01: qty 2

## 2019-07-01 MED ORDER — PHENYLEPHRINE 40 MCG/ML (10ML) SYRINGE FOR IV PUSH (FOR BLOOD PRESSURE SUPPORT)
PREFILLED_SYRINGE | INTRAVENOUS | Status: AC
  Filled 2019-07-01: qty 10

## 2019-07-01 MED ORDER — ACETAMINOPHEN 325 MG PO TABS: 325.0000 mg | ORAL_TABLET | Freq: Four times a day (QID) | ORAL | Status: DC | PRN

## 2019-07-01 MED ORDER — FENTANYL CITRATE (PF) 100 MCG/2ML IJ SOLN
50.0000 ug | INTRAMUSCULAR | Status: DC
  Filled 2019-07-01: qty 2

## 2019-07-01 MED ORDER — BUPIVACAINE HCL (PF) 0.75 % IJ SOLN
INTRAMUSCULAR | Status: DC | PRN
  Administered 2019-07-01: 1.8 mL via INTRATHECAL

## 2019-07-01 MED ORDER — HYDROMORPHONE HCL 2 MG PO TABS: 2.0000 mg | ORAL_TABLET | ORAL | Status: DC | PRN

## 2019-07-01 MED ORDER — SODIUM CHLORIDE 0.9 % IR SOLN
Status: DC | PRN
  Administered 2019-07-01: 1000 mL

## 2019-07-01 MED ORDER — TRANEXAMIC ACID-NACL 1000-0.7 MG/100ML-% IV SOLN
1000.0000 mg | INTRAVENOUS | Status: AC
  Administered 2019-07-01: 1000 mg via INTRAVENOUS
  Filled 2019-07-01: qty 100

## 2019-07-01 MED ORDER — MENTHOL 3 MG MT LOZG: 1.0000 | LOZENGE | OROMUCOSAL | Status: DC | PRN

## 2019-07-01 MED ORDER — SODIUM CHLORIDE (PF) 0.9 % IJ SOLN
INTRAMUSCULAR | Status: AC
  Filled 2019-07-01: qty 50

## 2019-07-01 MED ORDER — DEXAMETHASONE SODIUM PHOSPHATE 10 MG/ML IJ SOLN
INTRAMUSCULAR | Status: AC
  Filled 2019-07-01: qty 1

## 2019-07-01 MED ORDER — PANTOPRAZOLE SODIUM 40 MG PO TBEC
80.0000 mg | DELAYED_RELEASE_TABLET | Freq: Every day | ORAL | Status: DC
  Administered 2019-07-02: 80 mg via ORAL
  Filled 2019-07-01: qty 2

## 2019-07-01 MED ORDER — PHENYLEPHRINE HCL-NACL 10-0.9 MG/250ML-% IV SOLN
INTRAVENOUS | Status: DC | PRN
  Administered 2019-07-01: 25 ug/min via INTRAVENOUS

## 2019-07-01 MED ORDER — BUPIVACAINE HCL 0.25 % IJ SOLN
INTRAMUSCULAR | Status: AC
  Filled 2019-07-01: qty 1

## 2019-07-01 MED ORDER — CELECOXIB 200 MG PO CAPS
200.0000 mg | ORAL_CAPSULE | Freq: Two times a day (BID) | ORAL | Status: DC
  Administered 2019-07-01: 22:00:00 200 mg via ORAL
  Filled 2019-07-01: qty 1

## 2019-07-01 MED ORDER — LACTATED RINGERS IV SOLN: INTRAVENOUS | Status: DC

## 2019-07-01 MED ORDER — ENALAPRIL-HYDROCHLOROTHIAZIDE 10-25 MG PO TABS: 1.0000 | ORAL_TABLET | Freq: Every day | ORAL | Status: DC

## 2019-07-01 MED ORDER — SODIUM CHLORIDE 0.9 % IV SOLN: INTRAVENOUS | Status: DC

## 2019-07-01 MED ORDER — POLYVINYL ALCOHOL 1.4 % OP SOLN
1.0000 [drp] | OPHTHALMIC | Status: DC | PRN
  Filled 2019-07-01: qty 15

## 2019-07-01 MED ORDER — METOPROLOL TARTRATE 25 MG PO TABS
25.0000 mg | ORAL_TABLET | Freq: Every evening | ORAL | Status: DC
  Administered 2019-07-01: 25 mg via ORAL
  Filled 2019-07-01: qty 1

## 2019-07-01 MED ORDER — ONDANSETRON HCL 4 MG/2ML IJ SOLN: 4.0000 mg | Freq: Four times a day (QID) | INTRAMUSCULAR | Status: DC | PRN

## 2019-07-01 MED ORDER — PROPOFOL 10 MG/ML IV BOLUS
INTRAVENOUS | Status: AC
  Filled 2019-07-01: qty 60

## 2019-07-01 MED ORDER — SIMVASTATIN 20 MG PO TABS
20.0000 mg | ORAL_TABLET | Freq: Every evening | ORAL | Status: DC
  Administered 2019-07-01: 20 mg via ORAL
  Filled 2019-07-01: qty 1

## 2019-07-01 MED ORDER — CARBOXYMETHYLCELLUL-GLYCERIN 0.5-0.9 % OP SOLN: 1.0000 [drp] | Freq: Every day | OPHTHALMIC | Status: DC | PRN

## 2019-07-01 MED ORDER — METOCLOPRAMIDE HCL 5 MG/ML IJ SOLN: 5.0000 mg | Freq: Three times a day (TID) | INTRAMUSCULAR | Status: DC | PRN

## 2019-07-01 MED ORDER — CEFAZOLIN SODIUM-DEXTROSE 2-4 GM/100ML-% IV SOLN
2.0000 g | INTRAVENOUS | Status: AC
  Administered 2019-07-01: 2 g via INTRAVENOUS
  Filled 2019-07-01: qty 100

## 2019-07-01 MED ORDER — POLYETHYLENE GLYCOL 3350 17 G PO PACK
17.0000 g | PACK | Freq: Two times a day (BID) | ORAL | Status: DC
  Administered 2019-07-01: 17 g via ORAL
  Filled 2019-07-01 (×2): qty 1

## 2019-07-01 MED ORDER — ENALAPRIL MALEATE 10 MG PO TABS
10.0000 mg | ORAL_TABLET | Freq: Every day | ORAL | Status: DC
  Administered 2019-07-02: 10 mg via ORAL
  Filled 2019-07-01: qty 1

## 2019-07-01 MED ORDER — 0.9 % SODIUM CHLORIDE (POUR BTL) OPTIME
TOPICAL | Status: DC | PRN
  Administered 2019-07-01: 14:00:00 1000 mL

## 2019-07-01 SURGICAL SUPPLY — 57 items
ADH SKN CLS APL DERMABOND .7 (GAUZE/BANDAGES/DRESSINGS) ×1
BAG SPEC THK2 15X12 ZIP CLS (MISCELLANEOUS)
BAG ZIPLOCK 12X15 (MISCELLANEOUS) IMPLANT
BANDAGE ESMARK 6X9 LF (GAUZE/BANDAGES/DRESSINGS) ×1 IMPLANT
BIT DRILL 2.4X128 (BIT) ×1 IMPLANT
BIT DRILL 2.8X128 (BIT) ×1 IMPLANT
BLADE SAW SGTL 11.0X1.19X90.0M (BLADE) IMPLANT
BLADE SURG SZ10 CARB STEEL (BLADE) ×2 IMPLANT
BNDG CMPR 9X6 STRL LF SNTH (GAUZE/BANDAGES/DRESSINGS) ×1
BNDG ELASTIC 6X5.8 VLCR STR LF (GAUZE/BANDAGES/DRESSINGS) ×2 IMPLANT
BNDG ESMARK 6X9 LF (GAUZE/BANDAGES/DRESSINGS) ×2
COVER SURGICAL LIGHT HANDLE (MISCELLANEOUS) ×2 IMPLANT
COVER WAND RF STERILE (DRAPES) IMPLANT
CUFF TOURN SGL QUICK 34 (TOURNIQUET CUFF) ×2
CUFF TRNQT CYL 34X4.125X (TOURNIQUET CUFF) ×1 IMPLANT
DERMABOND ADVANCED (GAUZE/BANDAGES/DRESSINGS) ×1
DERMABOND ADVANCED .7 DNX12 (GAUZE/BANDAGES/DRESSINGS) ×1 IMPLANT
DRAPE SHEET LG 3/4 BI-LAMINATE (DRAPES) ×2 IMPLANT
DRAPE U-SHAPE 47X51 STRL (DRAPES) ×2 IMPLANT
DRESSING AQUACEL AG SP 3.5X10 (GAUZE/BANDAGES/DRESSINGS) IMPLANT
DRSG AQUACEL AG ADV 3.5X10 (GAUZE/BANDAGES/DRESSINGS) ×2 IMPLANT
DRSG AQUACEL AG SP 3.5X10 (GAUZE/BANDAGES/DRESSINGS) ×2
DURAPREP 26ML APPLICATOR (WOUND CARE) ×2 IMPLANT
ELECT REM PT RETURN 15FT ADLT (MISCELLANEOUS) ×2 IMPLANT
FACESHIELD WRAPAROUND (MASK) ×4 IMPLANT
FACESHIELD WRAPAROUND OR TEAM (MASK) ×2 IMPLANT
GLOVE BIOGEL M 7.0 STRL (GLOVE) IMPLANT
GLOVE BIOGEL PI IND STRL 7.5 (GLOVE) ×1 IMPLANT
GLOVE BIOGEL PI IND STRL 8.5 (GLOVE) ×1 IMPLANT
GLOVE BIOGEL PI INDICATOR 7.5 (GLOVE) ×1
GLOVE BIOGEL PI INDICATOR 8.5 (GLOVE) ×1
GLOVE ECLIPSE 8.0 STRL XLNG CF (GLOVE) ×2 IMPLANT
GLOVE ORTHO TXT STRL SZ7.5 (GLOVE) ×2 IMPLANT
GOWN STRL REUS W/TWL LRG LVL3 (GOWN DISPOSABLE) ×2 IMPLANT
GOWN STRL REUS W/TWL XL LVL3 (GOWN DISPOSABLE) ×2 IMPLANT
HANDPIECE INTERPULSE COAX TIP (DISPOSABLE) ×2
IMMOBILIZER KNEE 20 (SOFTGOODS) ×2
IMMOBILIZER KNEE 20 THIGH 36 (SOFTGOODS) IMPLANT
KIT BASIN OR (CUSTOM PROCEDURE TRAY) ×2 IMPLANT
KIT TURNOVER KIT A (KITS) IMPLANT
MANIFOLD NEPTUNE II (INSTRUMENTS) ×2 IMPLANT
PACK TOTAL JOINT (CUSTOM PROCEDURE TRAY) ×2 IMPLANT
PASSER SUT SWANSON 36MM LOOP (INSTRUMENTS) ×2 IMPLANT
PENCIL SMOKE EVACUATOR (MISCELLANEOUS) ×1 IMPLANT
PROTECTOR NERVE ULNAR (MISCELLANEOUS) ×2 IMPLANT
SET HNDPC FAN SPRY TIP SCT (DISPOSABLE) IMPLANT
SPLINT FIBERGLASS 4X30 (CAST SUPPLIES) ×2 IMPLANT
SUT ETHIBOND NAB CT1 #1 30IN (SUTURE) IMPLANT
SUT FIBERWIRE #2 38 T-5 BLUE (SUTURE) ×4
SUT MNCRL AB 3-0 PS2 18 (SUTURE) ×1 IMPLANT
SUT MNCRL AB 4-0 PS2 18 (SUTURE) ×2 IMPLANT
SUT VIC AB 1 CT1 36 (SUTURE) ×7 IMPLANT
SUT VIC AB 2-0 CT1 27 (SUTURE) ×6
SUT VIC AB 2-0 CT1 TAPERPNT 27 (SUTURE) ×2 IMPLANT
SUTURE FIBERWR #2 38 T-5 BLUE (SUTURE) ×2 IMPLANT
TOWEL OR 17X26 10 PK STRL BLUE (TOWEL DISPOSABLE) ×2 IMPLANT
TOWEL OR NON WOVEN STRL DISP B (DISPOSABLE) ×2 IMPLANT

## 2019-07-01 NOTE — Anesthesia Procedure Notes (Signed)
Spinal  Patient location during procedure: OR Start time: 07/01/2019 1:21 PM End time: 07/01/2019 1:26 PM Staffing Performed: anesthesiologist  Anesthesiologist: Lynda Rainwater, MD Preanesthetic Checklist Completed: patient identified, IV checked, site marked, risks and benefits discussed, surgical consent, monitors and equipment checked, pre-op evaluation and timeout performed Spinal Block Patient position: sitting Prep: DuraPrep Patient monitoring: heart rate, cardiac monitor, continuous pulse ox and blood pressure Approach: midline Location: L3-4 Injection technique: single-shot Needle Needle type: Quincke  Needle gauge: 22 G Needle length: 9 cm Assessment Sensory level: T4

## 2019-07-01 NOTE — Interval H&P Note (Signed)
History and Physical Interval Note:  07/01/2019 11:33 AM  Kathleen Paul  has presented today for surgery, with the diagnosis of Left knee recurrent patellar tendon tear.  The various methods of treatment have been discussed with the patient and family. After consideration of risks, benefits and other options for treatment, the patient has consented to  Procedure(s): OPEN PATELLA TENDON REPAIR (Left) as a surgical intervention.  The patient's history has been reviewed, patient examined, no change in status, stable for surgery.  I have reviewed the patient's chart and labs.  Questions were answered to the patient's satisfaction.     [name]

## 2019-07-01 NOTE — Anesthesia Preprocedure Evaluation (Signed)
Anesthesia Evaluation  Patient identified by MRN, date of birth, ID band Patient awake    Reviewed: Allergy & Precautions, NPO status , Patient's Chart, lab work & pertinent test results, reviewed documented beta blocker date and time   History of Anesthesia Complications Negative for: history of anesthetic complications  Airway Mallampati: II  TM Distance: >3 FB Neck ROM: Full    Dental no notable dental hx. (+) Dental Advisory Given, Partial Upper, Partial Lower   Pulmonary former smoker,    Pulmonary exam normal breath sounds clear to auscultation       Cardiovascular hypertension, Pt. on medications and Pt. on home beta blockers (-) angina+ CAD and + DVT  Normal cardiovascular exam Rhythm:Regular Rate:Normal   Prior to TKA in 02/2019, she was cleared by cardiology.  Per Daune Perch, NP 01/30/2019, "Based on ACC/AHA guidelines, Kathleen Paul would be at acceptable risk for the planned procedure without further cardiovascular testing.  It is OK to hold aspirin as needed for surgery."    Neuro/Psych PSYCHIATRIC DISORDERS Anxiety negative neurological ROS     GI/Hepatic Neg liver ROS, hiatal hernia, PUD, GERD  Medicated and Controlled,  Endo/Other  Hypothyroidism   Renal/GU negative Renal ROS     Musculoskeletal  (+) Arthritis ,   Abdominal   Peds  Hematology  (+) anemia ,   Anesthesia Other Findings Covid negative    Reproductive/Obstetrics                             Anesthesia Physical  Anesthesia Plan  ASA: III  Anesthesia Plan: Spinal   Post-op Pain Management:  Regional for Post-op pain   Induction:   PONV Risk Score and Plan: 2 and Treatment may vary due to age or medical condition, Propofol infusion, Ondansetron and Midazolam  Airway Management Planned: Natural Airway and Simple Face Mask  Additional Equipment: None  Intra-op Plan:   Post-operative Plan:    Informed Consent: I have reviewed the patients History and Physical, chart, labs and discussed the procedure including the risks, benefits and alternatives for the proposed anesthesia with the patient or authorized representative who has indicated his/her understanding and acceptance.       Plan Discussed with: CRNA and Anesthesiologist  Anesthesia Plan Comments:         Anesthesia Quick Evaluation

## 2019-07-01 NOTE — Anesthesia Procedure Notes (Signed)
Anesthesia Regional Block: Femoral nerve block   Pre-Anesthetic Checklist: ,, timeout performed, Correct Patient, Correct Site, Correct Laterality, Correct Procedure, Correct Position, site marked, Risks and benefits discussed,  Surgical consent,  Pre-op evaluation,  At surgeon's request and post-op pain management  Laterality: Left     Needles:  Injection technique: Single-shot  Needle Type: Stimiplex     Needle Length: 9cm  Needle Gauge: 21     Additional Needles:   Procedures:,,,, ultrasound used (permanent image in chart),,,,  Narrative:  Start time: 07/01/2019 12:48 PM End time: 07/01/2019 12:53 PM Injection made incrementally with aspirations every 5 mL.  Performed by: Personally  Anesthesiologist: Lynda Rainwater, MD

## 2019-07-01 NOTE — Discharge Instructions (Addendum)

## 2019-07-01 NOTE — Progress Notes (Signed)
Assisted Dr. Miller with left, ultrasound guided, adductor canal block. Side rails up, monitors on throughout procedure. See vital signs in flow sheet. Tolerated Procedure well.  

## 2019-07-01 NOTE — Transfer of Care (Signed)
Immediate Anesthesia Transfer of Care Note  Patient: SABRIEL GUADIAN  Procedure(s) Performed: OPEN PATELLA TENDON REPAIR (Left Knee)  Patient Location: PACU  Anesthesia Type:Spinal  Level of Consciousness: awake, alert , oriented and patient cooperative  Airway & Oxygen Therapy: Patient Spontanous Breathing and Patient connected to face mask oxygen  Post-op Assessment: Report given to RN and Post -op Vital signs reviewed and stable  Post vital signs: stable  Last Vitals:  Vitals Value Taken Time  BP 103/57 07/01/19 1518  Temp    Pulse 68 07/01/19 1524  Resp 17 07/01/19 1524  SpO2 100 % 07/01/19 1524  Vitals shown include unvalidated device data.  Last Pain:  Vitals:   07/01/19 1117  TempSrc: Oral         Complications: No apparent anesthesia complications

## 2019-07-01 NOTE — Plan of Care (Signed)
  Problem: Pain Managment: Goal: General experience of comfort will improve Outcome: Progressing   Problem: Nutrition: Goal: Adequate nutrition will be maintained Outcome: Progressing   Problem: Activity: Goal: Risk for activity intolerance will decrease Outcome: Progressing   

## 2019-07-01 NOTE — H&P (Signed)
Kathleen Paul is an 83 y.o. female.    Chief Complaint: Concern for re-injury to left patella tendon after total knee replacement   HPI: Pt is a 83 y.o. female complaining of left knee pain. History of index left total knee early August 2020.  She was recovering just fine until 3rd week of November when she was doing some knee exercises, step ups when she felt pop.  At that she was noted to have avulsed her patella tendon off the distal patella.  Noverber 24th I took her to the OR and felt that I had successfully restored the anatomy of her extensor mechanism with repair of her patella tendon to bone.  However she presented to the office 2 weeks post operatively with recent onset pain.  Though no palpable defect she was challenged with straight leg raise and radiographs revealed a high riding patella. I felt it is in her best interest to return to the OR to evaluate the tendon/bone to see if we can improve the anatomy.  I reviewed that if I am able to get it restored then I may place her in a cast versus a splint to hopefully allow for tendon healing  PCP:  [name], NP  D/C Plans: To be determined following appropriate treatment plan  PMH: Past Medical History:  Diagnosis Date  . Anxiety state, unspecified   . Arthritis   . Atherosclerosis   . Cancer ([place])    skin cancer  . Diverticulosis   . Esophageal stricture   . Essential hypertension, benign   . Gastric ulcer   . Hiatal hernia   . History of DVT (deep vein thrombosis) yrs ago   legs  . IDA (iron deficiency anemia)   . Inguinal hernia without mention of obstruction or gangrene, unilateral or unspecified, (not specified as recurrent)    2014  . Internal hemorrhoids   . Mild mitral regurgitation    a. 04/2010 Echo: nl EF, mild diast dysfxn, trace PR, mild TR, mild-mod MR.  . Non-obstructive CAD    a. 04/2010 Myoview: EF 77%, fixed apical/inferior defect;  b. 04/2010 Cath: LAD 26m, LCX/RCA minor irregs, EF 60%;  c.  04/2011 Myoview: low risk.  . Pure hypercholesterolemia   . Rupture of left patellar tendon    05-26-2019  . Scoliosis   . UI (urinary incontinence)   . Umbilical hernia without mention of obstruction or gangrene    2014  . Unspecified hypothyroidism   . Vitamin D deficiency     PSH: Past Surgical History:  Procedure Laterality Date  . ABDOMINAL HYSTERECTOMY  2008   complete   . BIOPSY  04/02/2018   Procedure: BIOPSY;  Surgeon: Rush Landmark Telford Nab., MD;  Location: [facility];  Service: Gastroenterology;;  . CARDIAC CATHETERIZATION  2011   [facility]  . COLONOSCOPY  2003   [facility]  . ESOPHAGOGASTRODUODENOSCOPY (EGD) WITH PROPOFOL N/A 04/02/2018   Procedure: ESOPHAGOGASTRODUODENOSCOPY (EGD) WITH PROPOFOL;  Surgeon: Rush Landmark Telford Nab., MD;  Location: [facility];  Service: Gastroenterology;  Laterality: N/A;  . FOREIGN BODY REMOVAL  04/02/2018   Procedure: FOREIGN BODY REMOVAL;  Surgeon: Rush Landmark Telford Nab., MD;  Location: [facility];  Service: Gastroenterology;;  . HERNIA REPAIR Right 09/23/2012   repair RIH  . LAPAROSCOPIC HYSTERECTOMY    . PATELLAR TENDON REPAIR Left 06/03/2019   Procedure: LEFT PATELLA TENDON REPAIR, RIGHT KNEE INJECTION.;  Surgeon: Paralee Cancel, MD;  Location: [facility];  Service: Orthopedics;  Laterality: Left;  . TONSILLECTOMY    .  TOTAL KNEE ARTHROPLASTY Left 02/11/2019   Procedure: TOTAL KNEE ARTHROPLASTY;  Surgeon: Paralee Cancel, MD;  Location: [facility];  Service: Orthopedics;  Laterality: Left;  70 mins    Social History:  reports that she quit smoking about 30 years ago. Her smoking use included cigarettes. She has a 15.00 pack-year smoking history. She has never used smokeless tobacco. She reports that she does not drink alcohol or use drugs.  Allergies:  Allergies  Allergen Reactions  . Codeine Nausea Only    Medications: Medications Prior to Admission  Medication Sig Dispense Refill  . acetaminophen (TYLENOL) 500 MG tablet Take  500 mg by mouth every 6 (six) hours as needed for moderate pain.    Marland Kitchen aspirin (ASPIRIN CHILDRENS) 81 MG chewable tablet Chew 1 tablet (81 mg total) by mouth 2 (two) times daily. Start the day after finishing Xarelto.  Take for 4 weeks, then resume regular dose. (Patient taking differently: Chew 81 mg by mouth every evening. ) 60 tablet 0  . Calcium-Magnesium-Vitamin D (CALCIUM 1200+D3 PO) Take 1 tablet by mouth daily.    . Carboxymethylcellul-Glycerin (LUBRICATING EYE DROPS OP) Place 1 drop into both eyes daily as needed (dry eyes).    . enalapril-hydrochlorothiazide (VASERETIC) 10-25 MG tablet Take 1 tablet by mouth daily.    Marland Kitchen levothyroxine (SYNTHROID) 88 MCG tablet TAKE 1 TABLET BY MOUTH EVERY MORNING ON AN EMPTY STOMACH AND WITH A FULL GLASS OF WATER (Patient taking differently: Take 88 mcg by mouth daily before breakfast. ) 90 tablet 0  . metoprolol tartrate (LOPRESSOR) 25 MG tablet TAKE 1 TABLET BY MOUTH EVERY EVENING (Patient taking differently: Take 25 mg by mouth every evening. ) 90 tablet 3  . Multiple Vitamin (MULTIVITAMIN PO) Take 1 tablet by mouth daily.    Marland Kitchen omeprazole (PRILOSEC) 40 MG capsule Take 1 capsule (40 mg total) by mouth 2 (two) times daily before a meal. (Patient taking differently: Take 40 mg by mouth daily. ) 180 capsule 1  . OVER THE COUNTER MEDICATION Apply 1 application topically 2 (two) times daily as needed (for pain). OxyRub Cream     . polyethylene glycol (MIRALAX / GLYCOLAX) 17 g packet Take 17 g by mouth 2 (two) times daily. (Patient taking differently: Take 17 g by mouth daily as needed for mild constipation or moderate constipation. ) 28 packet 0  . simvastatin (ZOCOR) 20 MG tablet TAKE 1 TABLET BY MOUTH EVERY DAY FOR CHOLESTEROL (Patient taking differently: Take 20 mg by mouth every evening. ) 90 tablet 0  . docusate sodium (COLACE) 100 MG capsule Take 1 capsule (100 mg total) by mouth 2 (two) times daily. (Patient not taking: Reported on 06/23/2019) 28 capsule 0   . ferrous sulfate (FERROUSUL) 325 (65 FE) MG tablet Take 1 tablet (325 mg total) by mouth 3 (three) times daily with meals for 14 days. 42 tablet 0  . HYDROmorphone (DILAUDID) 2 MG tablet Take 1-2 tablets (2-4 mg total) by mouth every 6 (six) hours as needed for severe pain. (Patient not taking: Reported on 06/23/2019) 40 tablet 0  . methocarbamol (ROBAXIN) 500 MG tablet Take 1 tablet (500 mg total) by mouth every 6 (six) hours as needed for muscle spasms. (Patient not taking: Reported on 06/23/2019) 40 tablet 0  . rivaroxaban (XARELTO) 10 MG TABS tablet Take 1 tablet (10 mg total) by mouth daily for 14 days. (Patient not taking: Reported on 06/23/2019) 14 tablet 0    No results found for this or any previous visit (  from the past 48 hour(s)). No results found.  ROS: Review of Systems - Negative except HPI  Recent hospitalizations and surgeries as noted  Physican Exam: Blood pressure (!) 142/77, pulse 63, temperature 97.6 F (36.4 C), temperature source Oral, resp. rate 14, SpO2 100 %. Awake alert oriented Left knee incision healed Left knee exam consistent with last office findings, extensor lag O/w NVI  Heart- normal sinus rhythm  Abdomen - soft non tender  Assessment/Plan Assessment: Concern for re-injury to left patella tendon after total knee replacement and prior attempted repair   Plan: Patient will undergo an open evaluation of the patella tendon and bone to see if I am able to repair it and possibly augment the repair.  Depending on the findings I place her in a long leg cast post -operatively. Risks benefits and expectation were discussed with the patient. Patient understand risks, benefits and expectation and wishes to proceed.   Pietro Cassis Alvan Dame, MD  07/01/2019, 11:19 AM

## 2019-07-01 NOTE — Anesthesia Procedure Notes (Signed)
Procedure Name: MAC Date/Time: 07/01/2019 1:02 PM Performed by: Lissa Morales, CRNA Pre-anesthesia Checklist: Patient identified, Emergency Drugs available, Suction available, Patient being monitored and Timeout performed Patient Re-evaluated:Patient Re-evaluated prior to induction Oxygen Delivery Method: Simple face mask Placement Confirmation: positive ETCO2

## 2019-07-01 NOTE — Anesthesia Postprocedure Evaluation (Signed)
Anesthesia Post Note  Patient: Kathleen Paul  Procedure(s) Performed: OPEN PATELLA TENDON REPAIR (Left Knee)     Patient location during evaluation: PACU Anesthesia Type: Spinal Level of consciousness: awake and alert Pain management: pain level controlled Vital Signs Assessment: post-procedure vital signs reviewed and stable Respiratory status: spontaneous breathing, nonlabored ventilation and respiratory function stable Cardiovascular status: blood pressure returned to baseline and stable Postop Assessment: no apparent nausea or vomiting Anesthetic complications: no    Last Vitals:  Vitals:   07/01/19 1600 07/01/19 1615  BP: 103/66 119/65  Pulse: 66 64  Resp: 11 11  Temp:  36.6 C  SpO2:      Last Pain:  Vitals:   07/01/19 1615  TempSrc:   PainSc: 0-No pain                 Lynda Rainwater

## 2019-07-01 NOTE — Plan of Care (Signed)
Plan of care 

## 2019-07-02 LAB — BASIC METABOLIC PANEL
Anion gap: 8 (ref 5–15)
BUN: 44 mg/dL — ABNORMAL HIGH (ref 8–23)
CO2: 24 mmol/L (ref 22–32)
Calcium: 8 mg/dL — ABNORMAL LOW (ref 8.9–10.3)
Chloride: 102 mmol/L (ref 98–111)
Creatinine, Ser: 1.07 mg/dL — ABNORMAL HIGH (ref 0.44–1.00)
GFR calc Af Amer: 56 mL/min — ABNORMAL LOW (ref >60)
GFR calc non Af Amer: 49 mL/min — ABNORMAL LOW (ref >60)
Glucose, Bld: 133 mg/dL — ABNORMAL HIGH (ref 70–99)
Potassium: 4.3 mmol/L (ref 3.5–5.1)
Sodium: 134 mmol/L — ABNORMAL LOW (ref 135–145)

## 2019-07-02 LAB — CBC
HCT: 24 % — ABNORMAL LOW (ref 36.0–46.0)
Hemoglobin: 7.5 g/dL — ABNORMAL LOW (ref 12.0–15.0)
MCH: 31 pg (ref 26.0–34.0)
MCHC: 31.3 g/dL (ref 30.0–36.0)
MCV: 99.2 fL (ref 80.0–100.0)
Platelets: 170 10*3/uL (ref 150–400)
RBC: 2.42 MIL/uL — ABNORMAL LOW (ref 3.87–5.11)
RDW: 15.9 % — ABNORMAL HIGH (ref 11.5–15.5)
WBC: 4.3 10*3/uL (ref 4.0–10.5)
nRBC: 0 % (ref 0.0–0.2)

## 2019-07-02 MED ORDER — ACETAMINOPHEN 500 MG PO TABS: 1000.0000 mg | ORAL_TABLET | Freq: Three times a day (TID) | ORAL | 0 refills | Status: DC

## 2019-07-02 MED ORDER — FERROUS SULFATE 325 (65 FE) MG PO TABS: 325.0000 mg | ORAL_TABLET | Freq: Three times a day (TID) | ORAL | 0 refills | Status: DC

## 2019-07-02 MED ORDER — RIVAROXABAN 10 MG PO TABS: 10.0000 mg | ORAL_TABLET | Freq: Every day | ORAL | 0 refills | Status: DC

## 2019-07-02 MED ORDER — HYDROMORPHONE HCL 2 MG PO TABS: 1.0000 mg | ORAL_TABLET | ORAL | 0 refills | Status: DC | PRN

## 2019-07-02 MED ORDER — METHOCARBAMOL 500 MG PO TABS: 500.0000 mg | ORAL_TABLET | Freq: Four times a day (QID) | ORAL | 0 refills | Status: DC | PRN

## 2019-07-02 MED ORDER — POLYETHYLENE GLYCOL 3350 17 G PO PACK: 17.0000 g | PACK | Freq: Two times a day (BID) | ORAL | 0 refills | Status: DC

## 2019-07-02 MED ORDER — DOCUSATE SODIUM 100 MG PO CAPS: 100.0000 mg | ORAL_CAPSULE | Freq: Two times a day (BID) | ORAL | 0 refills | Status: DC

## 2019-07-02 MED ORDER — ASPIRIN 81 MG PO CHEW: 81.0000 mg | CHEWABLE_TABLET | Freq: Two times a day (BID) | ORAL | 0 refills | Status: AC

## 2019-07-02 NOTE — Evaluation (Signed)
Physical Therapy Evaluation Patient Details Name: Kathleen Paul MRN: [id number] DOB: 1936-03-31 Today's Date: 07/02/2019   History of Present Illness  Pt s/p repeat open L patella tendon repair.  Pt with hx of L TKR 02/11/19, L patella tendon repair ~ 4 wks ago  Clinical Impression  Pt admitted with above diagnosis.  Amb short distance into hallway. tol well. Will see a second time today and should be ready for dc later today   Pt currently with functional limitations due to the deficits listed below (see PT Problem List). Pt will benefit from skilled PT to increase their independence and safety with mobility to allow discharge to the venue listed below.       Follow Up Recommendations Follow surgeon's recommendation for DC plan and follow-up therapies;Supervision for mobility/OOB    Equipment Recommendations  Wheelchair (measurements PT)(ordered, will need ELRs)    Recommendations for Other Services       Precautions / Restrictions Precautions Precautions: Knee;Fall;Other (comment) Precaution Comments: NO ROM L knee. posterior splint Required Braces or Orthoses: Knee Immobilizer - Left;Other Brace Knee Immobilizer - Left: On at all times Other Brace: L posterior splint Restrictions LLE Weight Bearing: Weight bearing as tolerated      Mobility  Bed Mobility Overal bed mobility: Needs Assistance Bed Mobility: Supine to Sit     Supine to sit: Min assist     General bed mobility comments: assist with LLE, incr time and effort  Transfers Overall transfer level: Needs assistance Equipment used: Rolling walker (2 wheeled) Transfers: Sit to/from Stand Sit to Stand: Min assist         General transfer comment: cues for LE management and use of UEs to self assist, assist to rise and transition to RW  Ambulation/Gait Ambulation/Gait assistance: Min assist Gait Distance (Feet): 20 Feet Assistive device: Rolling walker (2 wheeled) Gait Pattern/deviations: Step-to  pattern;Decreased stance time - left;Decreased weight shift to left;Decreased step length - left;Decreased stance time - right Gait velocity: decr   General Gait Details: cues for posture, position from RW and sequence  Stairs            Wheelchair Mobility    Modified Rankin (Stroke Patients Only)       Balance Overall balance assessment: Needs assistance Sitting-balance support: Feet supported Sitting balance-Leahy Scale: Fair       Standing balance-Leahy Scale: Poor Standing balance comment: reliant on UEs and external support                             Pertinent Vitals/Pain Pain Assessment: 0-10 Pain Intervention(s): Limited activity within patient's tolerance;Premedicated before session;Monitored during session    [place] expects to be discharged to:: Private residence Living Arrangements: Alone Available Help at Discharge: Family Type of Home: House Home Access: Ramped entrance     Home Layout: One level Home Equipment: Environmental consultant - 2 wheels;Cane - single point;Bedside commode;Hospital bed      Prior Function Level of Independence: Independent               Hand Dominance        Extremity/Trunk Assessment   Upper Extremity Assessment Upper Extremity Assessment: Overall WFL for tasks assessed(pt reported limitations bil shoulders d/t OA)    Lower Extremity Assessment Lower Extremity Assessment: RLE deficits/detail RLE Deficits / Details: severe crepitus noted R knee; strength grossly 3/5. df only to neutral LLE Deficits / Details: KI in place, posterior splint -  NO ROM at knee       Communication   Communication: No difficulties  Cognition Arousal/Alertness: Awake/alert Behavior During Therapy: WFL for tasks assessed/performed Overall Cognitive Status: Within Functional Limits for tasks assessed                                        General Comments      Exercises      Assessment/Plan    PT Assessment    PT Problem List         PT Treatment Interventions DME instruction;Gait training;Functional mobility training;Therapeutic activities;Therapeutic exercise;Patient/family education    PT Goals (Current goals can be found in the Care Plan section)  Acute Rehab PT Goals Patient Stated Goal: Regain IND PT Goal Formulation: With patient Time For Goal Achievement: 06/11/19 Potential to Achieve Goals: Good    Frequency 7X/week   Barriers to discharge        Co-evaluation               AM-PAC PT "6 Clicks" Mobility  Outcome Measure Help needed turning from your back to your side while in a flat bed without using bedrails?: A Little Help needed moving from lying on your back to sitting on the side of a flat bed without using bedrails?: A Little Help needed moving to and from a bed to a chair (including a wheelchair)?: A Little Help needed standing up from a chair using your arms (e.g., wheelchair or bedside chair)?: A Little Help needed to walk in hospital room?: A Little Help needed climbing 3-5 steps with a railing? : A Lot 6 Click Score: 17    End of Session Equipment Utilized During Treatment: Gait belt;Left knee immobilizer;Other (comment)(LLE posterior splint) Activity Tolerance: Patient tolerated treatment well Patient left: in chair;with call bell/phone within reach;with chair alarm set;with family/visitor present Nurse Communication: Mobility status      Time: 1136-1202 PT Time Calculation (min) (ACUTE ONLY): 26 min   Charges:   PT Evaluation $PT Eval Low Complexity: 1 Low PT Treatments $Gait Training: 8-22 mins        Temitope Flammer, PT   Acute Rehab Dept [facility]): [id number]   07/02/2019   [facility] 07/02/2019, 12:08 PM

## 2019-07-02 NOTE — Progress Notes (Signed)
     Subjective: 1 Day Post-Op Procedure(s) (LRB): OPEN PATELLA TENDON REPAIR (Left)    Seen by Dr. Alvan Dame. Patient reports pain as mild, pain controlled.  No reported events throughout the night. Dr. Alvan Dame discussed the procedure, findings, restrictions and expectations moving forward.  She is in a posterior splint and KI and can be WBAT.  NO ROM of the left knee, this was stressed to the patient. We will see how she does with PT and OT.  She will be ready to be discharged home once she is considered safe to do so.       Objective:   VITALS:   Vitals:   07/02/19 0142 07/02/19 0530  BP: 116/70 134/66  Pulse: 63 64  Resp: 16 17  Temp: (!) 97.3 F (36.3 C) (!) 97.3 F (36.3 C)  SpO2: 100% 100%    Incision: dressing C/D/I No cellulitis present Compartment soft  LABS Recent Labs    07/02/19 0357  HGB 7.5*  HCT 24.0*  WBC 4.3  PLT 170    Recent Labs    07/02/19 0357  NA 134*  K 4.3  BUN 44*  CREATININE 1.07*  GLUCOSE 133*     Assessment/Plan: 1 Day Post-Op Procedure(s) (LRB): OPEN PATELLA TENDON REPAIR (Left) Foley cath d/c'ed Advance diet Up with therapy D/C IV fluids Discharge home with home health  Follow up in 2 weeks at [facility] Follow up with OLIN,Chesni Vos D in 2 weeks.  Contact information:  [facility] [address], [address] [id number] [id number]    Overweight (BMI 25-29.9) Estimated body mass index is 27.56 kg/m as calculated from the following:   Height as of this encounter: 5' (1.524 m).   Weight as of this encounter: 64 kg. Patient also counseled that weight may inhibit the healing process Patient counseled that losing weight will help with future health issues          [place] Pugh. Brendalee Matthies   PAC  07/02/2019, 8:51 AM

## 2019-07-02 NOTE — Progress Notes (Signed)
07/02/19 1300  PT Visit Information  Last PT Received On 07/02/19  incr amb distance/tolerance this pm, improved wt shift with shoe on R foot and post op shoe in place on L foot. Will need supervision/assist for mobility initially for safety, pt has supportive family.   Assistance Needed +1  History of Present Illness Pt s/p repeat open L patella tendon repair.  Pt with hx of L TKR 02/11/19, L patella tendon repair ~ 4 wks ago  Subjective Data  Patient Stated Goal Regain IND  Precautions  Precautions Knee;Fall;Other (comment)  Precaution Comments NO ROM L knee. posterior splint, post op shoe on L   Required Braces or Orthoses Knee Immobilizer - Left;Other Brace  Knee Immobilizer - Left On at all times  Restrictions  LLE Weight Bearing WBAT  Pain Assessment  Pain Assessment No/denies pain  Cognition  Arousal/Alertness Awake/alert  Behavior During Therapy WFL for tasks assessed/performed  Overall Cognitive Status Within Functional Limits for tasks assessed  Bed Mobility  General bed mobility comments on EOB per pt request with family present   Transfers  Overall transfer level Needs assistance  Equipment used Rolling walker (2 wheeled)  Transfers Sit to/from Stand  Sit to Stand Min assist  General transfer comment cues for LE management and use of UEs to self assist, assist to rise and transition to RW  Ambulation/Gait  Ambulation/Gait assistance Min guard  Gait Distance (Feet) 50 Feet  Assistive device Rolling walker (2 wheeled)  Gait Pattern/deviations Step-to pattern;Decreased stance time - left;Decreased weight shift to left;Decreased step length - left;Decreased stance time - right  General Gait Details cues for posture, position from RW and sequence as well as to avoid  any twisting of knee during turns  Gait velocity decr  Balance  Standing balance-Leahy Scale Poor  Standing balance comment reliant on UEs   PT - End of Session  Equipment Utilized During Treatment Gait  belt  Activity Tolerance Patient tolerated treatment well  Patient left with call bell/phone within reach;Other (comment) (EOB)  Nurse Communication Mobility status   PT - Assessment/Plan  PT Plan Current plan remains appropriate  PT Visit Diagnosis Difficulty in walking, not elsewhere classified (R26.2)  PT Frequency (ACUTE ONLY) 7X/week  Follow Up Recommendations Follow surgeon's recommendation for DC plan and follow-up therapies;Supervision for mobility/OOB  PT equipment Wheelchair (measurements PT)  AM-PAC PT "6 Clicks" Mobility Outcome Measure (Version 2)  Help needed turning from your back to your side while in a flat bed without using bedrails? 3  Help needed moving from lying on your back to sitting on the side of a flat bed without using bedrails? 3  Help needed moving to and from a bed to a chair (including a wheelchair)? 3  Help needed standing up from a chair using your arms (e.g., wheelchair or bedside chair)? 3  Help needed to walk in hospital room? 3  Help needed climbing 3-5 steps with a railing?  3  6 Click Score 18  Consider Recommendation of Discharge To: Home with [facility]  PT Goal Progression  Progress towards PT goals Progressing toward goals  Acute Rehab PT Goals  PT Goal Formulation With patient  Time For Goal Achievement 06/11/19  Potential to Achieve Goals Good  PT Time Calculation  PT Start Time (ACUTE ONLY) 1334  PT Stop Time (ACUTE ONLY) 1346  PT Time Calculation (min) (ACUTE ONLY) 12 min  PT General Charges  $$ ACUTE PT VISIT 1 Visit  PT Treatments  $Gait  Training 8-22 mins

## 2019-07-02 NOTE — TOC Initial Note (Signed)
Transition of Care [facility]) - Initial/Assessment Note    Patient Details  Name: Kathleen Paul MRN: [id number] Date of Birth: 02-Nov-1935  Transition of [facility]) CM/SW Contact:    Leeroy Cha, RN Phone Number: 07/02/2019, 12:40 PM  Clinical Narrative:                 [facility] care through [facility] unavaiable  Expected Discharge Plan: [place] Barriers to Discharge: No Barriers Identified   Patient Goals and CMS Choice        Expected Discharge Plan and Services Expected Discharge Plan: [place]   Discharge Planning Services: CM Consult Post Acute Care Choice: [place] arrangements for the past 2 months: Single Family Home Expected Discharge Date: 07/02/19                         HH Arranged: PT, OT HH Agency: [facility] [employer]) Date [place]: 07/02/19 Time [place]: 70 Representative spoke with at [place]: [place] Arrangements/Services Living arrangements for the past 2 months: [place]   Patient language and need for interpreter reviewed:: No Do you feel safe going back to the place where you live?: Yes      Need for Family Participation in Patient Care: Yes (Comment) Care giver support system in place?: Yes (comment)   Criminal Activity/Legal Involvement Pertinent to Current Situation/Hospitalization: No - Comment as needed  Activities of Daily Living Home Assistive Devices/Equipment: Walker (specify type) ADL Screening (condition at time of admission) Patient's cognitive ability adequate to safely complete daily activities?: Yes Is the patient deaf or have difficulty hearing?: No Does the patient have difficulty seeing, even when wearing glasses/contacts?: No Does the patient have difficulty concentrating, remembering, or making decisions?: No Patient able to express need for assistance with ADLs?: Yes Does the patient have  difficulty dressing or bathing?: No Independently performs ADLs?: Yes (appropriate for developmental age) Communication: Independent Dressing (OT): Needs assistance Is this a change from baseline?: Pre-admission baseline Grooming: Independent Feeding: Independent Bathing: Independent, Needs assistance Is this a change from baseline?: Pre-admission baseline Toileting: Independent In/Out Bed: Independent Walks in Home: Independent Does the patient have difficulty walking or climbing stairs?: Yes Weakness of Legs: Left Weakness of Arms/Hands: None  Permission Sought/Granted                  Emotional Assessment Appearance:: Appears stated age     Orientation: : Oriented to Self, Oriented to Place, Oriented to  Time, Oriented to Situation Alcohol / Substance Use: Not Applicable Psych Involvement: No (comment)  Admission diagnosis:  Patellar tendon rupture, left, subsequent encounter [S86.812D] Patient Active Problem List   Diagnosis Date Noted  . Patellar tendon rupture, left, subsequent encounter 07/01/2019  . Overweight (BMI 25.0-29.9) 06/04/2019  . Rupture patellar tendon, left, subsequent encounter 06/03/2019  . Status post total left knee replacement 02/11/2019  . Food impaction of esophagus 04/02/2018  . Primary osteoarthritis of both hands 12/24/2017  . Primary osteoarthritis of both knees 12/24/2017  . Primary osteoarthritis of both feet 12/24/2017  . Osteoarthritis of both shoulders 12/24/2017  . Gastroesophageal reflux disease 08/30/2017  . GAD (generalized anxiety disorder) 09/05/2016  . Renal insufficiency 07/17/2013  . Suprapubic mass 08/20/2012  . Hypothyroidism   . UI (urinary incontinence)   . Stricture and stenosis of esophagus 04/11/2011  . Chest pain 03/29/2011  .  Dyslipidemia 03/29/2011  . Hypertension 03/29/2011  . CAD (coronary artery disease) 03/29/2011  . Palpitations 03/29/2011   PCP:  [name], NP Pharmacy:   [facility]  [place], [place] [address] [place] [address] Phone: [phone] Fax: [phone]     Social Determinants of Health (SDOH) Interventions    Readmission Risk Interventions No flowsheet data found.

## 2019-07-03 NOTE — Brief Op Note (Signed)
07/01/2019  3:00PM  PATIENT:  Kathleen Paul  83 y.o. female  PRE-OPERATIVE DIAGNOSIS:  Left knee recurrent patellar tendon tear  POST-OPERATIVE DIAGNOSIS:  Left patella tendon insufficiency  PROCEDURE:  Procedure(s): OPEN PATELLA TENDON REPAIR (Left)  SURGEON:  Surgeon(s) and Role:    Paralee Cancel, MD - Primary  PHYSICIAN ASSISTANT: Griffith Citron, PA-C  ANESTHESIA:   spinal  EBL:  200 mL   BLOOD ADMINISTERED:none  DRAINS: none   LOCAL MEDICATIONS USED:  NONE  SPECIMEN:  No Specimen  DISPOSITION OF SPECIMEN:  N/A  COUNTS:  YES  TOURNIQUET:   Total Tourniquet Time Documented: Thigh (Left) - 33 minutes Total: Thigh (Left) - 33 minutes   DICTATION: .Other Dictation: Dictation Number [id number]  PLAN OF CARE: Admit for overnight observation  PATIENT DISPOSITION:  PACU - hemodynamically stable.   Delay start of Pharmacological VTE agent (>24hrs) due to surgical blood loss or risk of bleeding: no

## 2019-07-03 NOTE — Op Note (Signed)
NAME: Kathleen Paul, Kathleen Paul MEDICAL RECORD [id number] ACCOUNT [id number] DATE OF BIRTH:1936/03/10 FACILITY: [facility] LOCATION: [facility] PHYSICIAN:Aaban Griep Marian Sorrow, MD  OPERATIVE REPORT  DATE OF PROCEDURE:  07/01/2019  PREOPERATIVE DIAGNOSIS:  Concern for re-rupture of left patellar tendon following total knee arthroplasty.    POSTOPERATIVE DIAGNOSIS/FINDINGS:  Insufficiency of the left patellar tendon status post attempted repair.  PROCEDURE:  Revision repair of the left patellar tendon avulsion from the inferior pole of the patella.  SURGEON:  Paralee Cancel, MD  ASSISTANT:  Griffith Citron, PA-C.  Please note, [name] was present for the entirety of the case from preoperative positioning, perioperative management of the operative extremity, general facilitation of the case and primary wound closure.  ANESTHESIA:  Spinal.  SPECIMENS:  None.  COMPLICATIONS:  None.  ESTIMATED BLOOD LOSS:  Minimal.  TOURNIQUET:  Up for 33 minutes at 250 mmHg.  INDICATIONS FOR PROCEDURE:  The patient is a pleasant 83 year old female with history of left total knee replacement in 02/2019.  She was recovering well in her normal state of health until 3 months postoperatively she was doing some stepping up  exercises when she felt a pop in her left knee.  She presented to the office and was noted to have insufficiency of the patellar tendon.  MRI confirmed an avulsion off the inferior pole of the patella versus off the tubercle.  She was taken to the  operating room shortly thereafter and underwent attempted primary repair of_ the patella to the inferior pole of the patella.  She was placed into a T-ROM brace and maintained full extension at all times.  She was recently seen in the office for a postoperative visit after this procedure and was noted to have some increasing pain prior to evaluation.  Radiographs revealed persistently high riding patella.  Due to this, I felt it was important to reevaluate her knee.   Questions were asked by her family, which were very appropriate, however, that I did not  have all the answers to, but I felt that something needed to happen in order to provide her the best capability to maintain full extension actively.  The risks of recurrent problems with the patella and complications associated with this were discussed and reviewed.  The risk of infection and DVT as well as persistent or recurring extensor mechanism problems.  We discussed the possibility of her needing to be placed into a cast to prevent any motion of the knee at all.  Consent was obtained for evaluation and management of this complex issue.    DESCRIPTION OF PROCEDURE:  The patient was brought to the operative theater.  Once adequate anesthesia, preoperative antibiotics and Ancef administered, she was positioned supine on the OR table.  A left thigh tourniquet was placed.  Left lower extremity  was then prepped and draped in sterile fashion.  Timeout was performed identifying the patient, the planned procedure and extremity.  Her old incision was excised.  Soft tissue planes created.  When I got down to the extensor mechanism, I found that the patellar tendon was intact but attenuated and that the previously placed FiberWire sutures appeared to be loose.  As I carefully evaluated this, I still found that the repair appeared to be intact; however, there was attenuation of this tendon perhaps from increased strain.  Given these findings, I elected at this point to revise this patellar tendon repair.  I removed the old sutures.  I did excise some of the less healthy appearing tendon as I  did not feel that it was viable for repair.  I then mobilized the extensor tendon and soft tissues proximally.  I then placed two #1 Viberwire sutures again through  the patellar tendon in a [place] weave pattern.  I then reevaluated and found the drill holes from the previous procedure and passed these sutures through the patella in a  similar fashion, again 1 medial, 1 lateral, and 2 centrally.  At this point, I  let the tourniquet down to allow for further mobilization of the patella from the proximal aspect.  We then applied downward traction on the patella to try to restore its normal height.  I then had the surgical technician apply tension on the sutures  until we had tendon contact to bone.  The patella appeared to be in am anatomic position.  I then sutured the medial strands together, then the lateral strands together and then the 2 of these strands together in the central portion.  Once this was done, a good  connection to the patella of the inferior pole of the patella and the extensor mechanism felt very sturdy and stable.  I then used #1 Vicryl sutures to re-approximate the medial and lateral retinacular tissues to the repaired tendon.  This added further support.  At this point, there was almost a complete watertight seal of the extensor mechanism.  I felt very secure about this.  We then irrigated well again.  At this point, the remaining wound was closed with 2-0 Vicryl and a running Monocryl stitch.  The knee was cleaned, dried and dressed sterilely with  surgical glue and Aquacel dressing.  She was then placed into a posterior splint with the knee in full extension and then placed in a knee immobilizer to augment this.  She was brought to the recovery room in stable condition, tolerating her procedure  well.  It was reviewed with her daughter.  We will allow her to be weightbearing as tolerating.  We just need to really maintain full extension.  A posterior splint and knee immobilizer to augment this was applied in a fully extended posiiton.  This was done to prevent issues potential with early post-operative swelling.  She will be seen back in office in 2 weeks to evaluate her wound and we will likely place her into a straight leg cast for a total of at least 6 if not 8 weeks to allow for patellar tendon to bone healing. I  had stressed the importance of immobilization throughout this process with her and her family  LN/NUANCE  D:07/03/2019 T:07/03/2019 JOB:[id number]

## 2019-07-07 DIAGNOSIS — T84023D Instability of internal left knee prosthesis, subsequent encounter: Secondary | ICD-10-CM | POA: Diagnosis not present

## 2019-07-07 DIAGNOSIS — Z9181 History of falling: Secondary | ICD-10-CM | POA: Diagnosis not present

## 2019-07-07 DIAGNOSIS — Z7901 Long term (current) use of anticoagulants: Secondary | ICD-10-CM | POA: Diagnosis not present

## 2019-07-07 DIAGNOSIS — Z7982 Long term (current) use of aspirin: Secondary | ICD-10-CM | POA: Diagnosis not present

## 2019-07-09 NOTE — Discharge Summary (Signed)
Physician Discharge Summary  Patient ID: Kathleen Paul MRN: [id number] DOB/AGE: Oct 25, 1935 83 y.o.  Admit date: 07/01/2019 Discharge date: 07/02/2019   Procedures:  Procedure(s) (LRB): OPEN PATELLA TENDON REPAIR (Left)  Attending Physician:  Dr. Paralee Cancel   Admission Diagnoses:   Concern for re-injury to left patella tendon after total knee replacement   Discharge Diagnoses:  Principal Problem:   Rupture patellar tendon, left, subsequent encounter Active Problems:   Patellar tendon rupture, left, subsequent encounter  Past Medical History:  Diagnosis Date  . Anxiety state, unspecified   . Arthritis   . Atherosclerosis   . Cancer ([place])    skin cancer  . Diverticulosis   . Esophageal stricture   . Essential hypertension, benign   . Gastric ulcer   . Hiatal hernia   . History of DVT (deep vein thrombosis) yrs ago   legs  . IDA (iron deficiency anemia)   . Inguinal hernia without mention of obstruction or gangrene, unilateral or unspecified, (not specified as recurrent)    2014  . Internal hemorrhoids   . Mild mitral regurgitation    a. 04/2010 Echo: nl EF, mild diast dysfxn, trace PR, mild TR, mild-mod MR.  . Non-obstructive CAD    a. 04/2010 Myoview: EF 77%, fixed apical/inferior defect;  b. 04/2010 Cath: LAD 67m, LCX/RCA minor irregs, EF 60%;  c. 04/2011 Myoview: low risk.  . Pure hypercholesterolemia   . Rupture of left patellar tendon    05-26-2019  . Scoliosis   . UI (urinary incontinence)   . Umbilical hernia without mention of obstruction or gangrene    2014  . Unspecified hypothyroidism   . Vitamin D deficiency     HPI:    Pt is a 83 y.o. female complaining of left knee pain. History of index left total knee early August 2020.  She was recovering just fine until 3rd week of November when she was doing some knee exercises, step ups when she felt pop.  At that she was noted to have avulsed her patella tendon off the distal patella.  Noverber 24th I  took her to the OR and felt that I had successfully restored the anatomy of her extensor mechanism with repair of her patella tendon to bone.  However she presented to the office 2 weeks post operatively with recent onset pain.  Though no palpable defect she was challenged with straight leg raise and radiographs revealed a high riding patella. I felt it is in her best interest to return to the OR to evaluate the tendon/bone to see if we can improve the anatomy.  I reviewed that if I am able to get it restored then I may place her in a cast versus a splint to hopefully allow for tendon healing   PCP: [name], NP   Discharged Condition: good  Hospital Course:  Patient underwent the above stated procedure on 07/01/2019. Patient tolerated the procedure well and brought to the recovery room in good condition and subsequently to the floor.  POD #1 BP: 134/66 ; Pulse: 64 ; Temp: 97.3 F (36.3 C) ; Resp: 17 Patient reports pain as mild, pain controlled.  No reported events throughout the night. Dr. Alvan Dame discussed the procedure, findings, restrictions and expectations moving forward.  She is in a posterior splint and KI and can be WBAT.  NO ROM of the left knee, this was stressed to the patient. We will see how she does with PT and OT.  She will be  ready to be discharged home. Neurovascular intact and incision: dressing C/D/I.   LABS  Basename    HGB     7.5  HCT     24.0    Discharge Exam: General appearance: alert, cooperative and no distress Extremities: Homans sign is negative, no sign of DVT, no edema, redness or tenderness in the calves or thighs and no ulcers, gangrene or trophic changes  Disposition: Home with follow up in 2 weeks   Follow-up Information    Paralee Cancel, MD. Schedule an appointment as soon as possible for a visit in 2 weeks.   Specialty: Orthopedic Surgery Contact information: [address] [place] [address] [id number]            Discharge Instructions    Call MD / Call 911   Complete by: As directed    If you experience chest pain or shortness of breath, CALL 911 and be transported to the hospital emergency room.  If you develope a fever above 101 F, pus (white drainage) or increased drainage or redness at the wound, or calf pain, call your surgeon's office.   Constipation Prevention   Complete by: As directed    Drink plenty of fluids.  Prune juice may be helpful.  You may use a stool softener, such as Colace (over the counter) 100 mg twice a day.  Use MiraLax (over the counter) for constipation as needed.   Diet - low sodium heart healthy   Complete by: As directed    Discharge instructions   Complete by: As directed    Maintain surgical dressing until follow up in the clinic. If the edges start to pull up, may reinforce with tape. If the dressing is no longer working, may remove and cover with gauze and tape, but must keep the area dry and clean.  Follow up in 2 weeks at [facility]. Call with any questions or concerns.   Increase activity slowly as tolerated   Complete by: As directed    Weight bearing as tolerated with splint and knee immobilizer. NO range of motion of the left knee.      Allergies as of 07/02/2019      Reactions   Codeine Nausea Only      Medication List    TAKE these medications   acetaminophen 500 MG tablet Commonly known as: TYLENOL Take 2 tablets (1,000 mg total) by mouth every 8 (eight) hours. What changed:   how much to take  when to take this  reasons to take this   aspirin 81 MG chewable tablet Commonly known as: Aspirin Childrens Chew 1 tablet (81 mg total) by mouth 2 (two) times daily. Take for 4 weeks, then resume regular dose. Start taking on: July 18, 2019 What changed:   additional instructions  These instructions start on July 18, 2019. If you are unsure what to do until then, ask your doctor or other care provider.   CALCIUM 1200+D3 PO  Take 1 tablet by mouth daily.   docusate sodium 100 MG capsule Commonly known as: Colace Take 1 capsule (100 mg total) by mouth 2 (two) times daily.   enalapril-hydrochlorothiazide 10-25 MG tablet Commonly known as: VASERETIC Take 1 tablet by mouth daily.   ferrous sulfate 325 (65 FE) MG tablet Commonly known as: FerrouSul Take 1 tablet (325 mg total) by mouth 3 (three) times daily with meals for 14 days.   HYDROmorphone 2 MG tablet Commonly known as: Dilaudid Take 0.5-1 tablets (1-2  mg total) by mouth every 4 (four) hours as needed for severe pain. What changed:   how much to take  when to take this   levothyroxine 88 MCG tablet Commonly known as: SYNTHROID TAKE 1 TABLET BY MOUTH EVERY MORNING ON AN EMPTY STOMACH AND WITH A FULL GLASS OF WATER What changed: See the new instructions.   LUBRICATING EYE DROPS OP Place 1 drop into both eyes daily as needed (dry eyes).   methocarbamol 500 MG tablet Commonly known as: Robaxin Take 1 tablet (500 mg total) by mouth every 6 (six) hours as needed for muscle spasms.   metoprolol tartrate 25 MG tablet Commonly known as: LOPRESSOR TAKE 1 TABLET BY MOUTH EVERY EVENING What changed:   how much to take  how to take this  when to take this  additional instructions   MULTIVITAMIN PO Take 1 tablet by mouth daily.   omeprazole 40 MG capsule Commonly known as: PRILOSEC Take 1 capsule (40 mg total) by mouth 2 (two) times daily before a meal. What changed: when to take this   OVER THE COUNTER MEDICATION Apply 1 application topically 2 (two) times daily as needed (for pain). OxyRub Cream   polyethylene glycol 17 g packet Commonly known as: MIRALAX / GLYCOLAX Take 17 g by mouth 2 (two) times daily. What changed:   when to take this  reasons to take this   rivaroxaban 10 MG Tabs tablet Commonly known as: Xarelto Take 1 tablet (10 mg total) by mouth daily for 14 days.   simvastatin 20 MG tablet Commonly known as: ZOCOR  TAKE 1 TABLET BY MOUTH EVERY DAY FOR CHOLESTEROL What changed:   how much to take  how to take this  when to take this  additional instructions        Signed: [place]. Dakayla Disanti   PA-C  07/09/2019, 10:31 AM

## 2019-07-16 DIAGNOSIS — Z4789 Encounter for other orthopedic aftercare: Secondary | ICD-10-CM | POA: Diagnosis not present

## 2019-08-02 DIAGNOSIS — S76192D Other specified injury of left quadriceps muscle, fascia and tendon, subsequent encounter: Secondary | ICD-10-CM | POA: Diagnosis not present

## 2019-08-02 DIAGNOSIS — M1712 Unilateral primary osteoarthritis, left knee: Secondary | ICD-10-CM | POA: Diagnosis not present

## 2019-08-11 ENCOUNTER — Telehealth: Payer: Self-pay | Admitting: Primary Care

## 2019-08-11 NOTE — Telephone Encounter (Signed)
Patient called to schedule lab work for 2/8 She stated she is due for a refill on all her medications and needed labs done before hand.  Wanted to make sure this is correct?

## 2019-08-11 NOTE — Telephone Encounter (Signed)
She needs an office visit for CPE/follow up. Please schedule and notify her that we can get labs same day.

## 2019-08-12 ENCOUNTER — Other Ambulatory Visit: Payer: Self-pay | Admitting: Primary Care

## 2019-08-12 DIAGNOSIS — E039 Hypothyroidism, unspecified: Secondary | ICD-10-CM

## 2019-08-12 DIAGNOSIS — E785 Hyperlipidemia, unspecified: Secondary | ICD-10-CM

## 2019-08-12 DIAGNOSIS — R7303 Prediabetes: Secondary | ICD-10-CM

## 2019-08-12 DIAGNOSIS — N289 Disorder of kidney and ureter, unspecified: Secondary | ICD-10-CM

## 2019-08-12 DIAGNOSIS — I10 Essential (primary) hypertension: Secondary | ICD-10-CM

## 2019-08-12 NOTE — Telephone Encounter (Signed)
Can we complete a virtual visit at least? We can get labs as she is scheduled.

## 2019-08-12 NOTE — Telephone Encounter (Signed)
Called patient to schedule appointment. She states she is unable to come into office due to recently having surgery and being in a wheel chair. I advised her that she may need an office visit to continue refilling her medications. She said she plans on coming in once she recovers, but she is unable to come in right now.

## 2019-08-17 ENCOUNTER — Other Ambulatory Visit: Payer: Self-pay | Admitting: Primary Care

## 2019-08-17 DIAGNOSIS — E039 Hypothyroidism, unspecified: Secondary | ICD-10-CM

## 2019-08-17 DIAGNOSIS — E785 Hyperlipidemia, unspecified: Secondary | ICD-10-CM

## 2019-08-17 DIAGNOSIS — I10 Essential (primary) hypertension: Secondary | ICD-10-CM

## 2019-08-18 ENCOUNTER — Other Ambulatory Visit: Payer: PPO

## 2019-08-18 ENCOUNTER — Other Ambulatory Visit: Payer: Self-pay

## 2019-08-18 ENCOUNTER — Other Ambulatory Visit (INDEPENDENT_AMBULATORY_CARE_PROVIDER_SITE_OTHER): Payer: PPO

## 2019-08-18 DIAGNOSIS — R7303 Prediabetes: Secondary | ICD-10-CM | POA: Diagnosis not present

## 2019-08-18 DIAGNOSIS — E785 Hyperlipidemia, unspecified: Secondary | ICD-10-CM | POA: Diagnosis not present

## 2019-08-18 DIAGNOSIS — E039 Hypothyroidism, unspecified: Secondary | ICD-10-CM | POA: Diagnosis not present

## 2019-08-18 DIAGNOSIS — Z4789 Encounter for other orthopedic aftercare: Secondary | ICD-10-CM | POA: Diagnosis not present

## 2019-08-18 DIAGNOSIS — I10 Essential (primary) hypertension: Secondary | ICD-10-CM

## 2019-08-18 DIAGNOSIS — N289 Disorder of kidney and ureter, unspecified: Secondary | ICD-10-CM

## 2019-08-18 DIAGNOSIS — M1712 Unilateral primary osteoarthritis, left knee: Secondary | ICD-10-CM | POA: Diagnosis not present

## 2019-08-18 LAB — COMPREHENSIVE METABOLIC PANEL
ALT: 10 U/L (ref 0–35)
AST: 17 U/L (ref 0–37)
Albumin: 4.1 g/dL (ref 3.5–5.2)
Alkaline Phosphatase: 61 U/L (ref 39–117)
BUN: 45 mg/dL — ABNORMAL HIGH (ref 6–23)
CO2: 25 mEq/L (ref 19–32)
Calcium: 9.7 mg/dL (ref 8.4–10.5)
Chloride: 99 mEq/L (ref 96–112)
Creatinine, Ser: 1.18 mg/dL (ref 0.40–1.20)
GFR: 43.88 mL/min — ABNORMAL LOW (ref >60.00)
Glucose, Bld: 97 mg/dL (ref 70–99)
Potassium: 4.2 mEq/L (ref 3.5–5.1)
Sodium: 136 mEq/L (ref 135–145)
Total Bilirubin: 0.6 mg/dL (ref 0.2–1.2)
Total Protein: 6.9 g/dL (ref 6.0–8.3)

## 2019-08-18 LAB — HEMOGLOBIN A1C: Hgb A1c MFr Bld: 5.5 % (ref 4.6–6.5)

## 2019-08-18 LAB — TSH: TSH: 6.22 u[IU]/mL — ABNORMAL HIGH (ref 0.35–4.50)

## 2019-08-18 LAB — CBC
HCT: 32.6 % — ABNORMAL LOW (ref 36.0–46.0)
Hemoglobin: 10.9 g/dL — ABNORMAL LOW (ref 12.0–15.0)
MCHC: 33.3 g/dL (ref 30.0–36.0)
MCV: 95.7 fl (ref 78.0–100.0)
Platelets: 270 10*3/uL (ref 150.0–400.0)
RBC: 3.41 Mil/uL — ABNORMAL LOW (ref 3.87–5.11)
RDW: 15.7 % — ABNORMAL HIGH (ref 11.5–15.5)
WBC: 4.7 10*3/uL (ref 4.0–10.5)

## 2019-08-18 LAB — LIPID PANEL
Cholesterol: 146 mg/dL (ref 0–200)
HDL: 57.8 mg/dL (ref >39.00)
LDL Cholesterol: 71 mg/dL (ref 0–99)
NonHDL: 88.61
Total CHOL/HDL Ratio: 3
Triglycerides: 86 mg/dL (ref 0.0–149.0)
VLDL: 17.2 mg/dL (ref 0.0–40.0)

## 2019-08-19 ENCOUNTER — Encounter: Payer: Self-pay | Admitting: Primary Care

## 2019-08-19 ENCOUNTER — Other Ambulatory Visit: Payer: Self-pay

## 2019-08-19 ENCOUNTER — Ambulatory Visit (INDEPENDENT_AMBULATORY_CARE_PROVIDER_SITE_OTHER): Payer: PPO | Admitting: Primary Care

## 2019-08-19 DIAGNOSIS — I10 Essential (primary) hypertension: Secondary | ICD-10-CM | POA: Diagnosis not present

## 2019-08-19 DIAGNOSIS — K2100 Gastro-esophageal reflux disease with esophagitis, without bleeding: Secondary | ICD-10-CM

## 2019-08-19 DIAGNOSIS — E785 Hyperlipidemia, unspecified: Secondary | ICD-10-CM | POA: Diagnosis not present

## 2019-08-19 DIAGNOSIS — I2510 Atherosclerotic heart disease of native coronary artery without angina pectoris: Secondary | ICD-10-CM

## 2019-08-19 DIAGNOSIS — E039 Hypothyroidism, unspecified: Secondary | ICD-10-CM | POA: Diagnosis not present

## 2019-08-19 DIAGNOSIS — K222 Esophageal obstruction: Secondary | ICD-10-CM | POA: Diagnosis not present

## 2019-08-19 DIAGNOSIS — M170 Bilateral primary osteoarthritis of knee: Secondary | ICD-10-CM

## 2019-08-19 DIAGNOSIS — N289 Disorder of kidney and ureter, unspecified: Secondary | ICD-10-CM | POA: Diagnosis not present

## 2019-08-19 NOTE — Assessment & Plan Note (Signed)
Compliant to simvastatin, recent lipid panel stable. Continue to monitor.

## 2019-08-19 NOTE — Telephone Encounter (Signed)
We need to clarify the metoprolol tartrate 25 mg tablet. This is typically taken twice daily and I see that it's written for once daily. She does see cardiology, could we send that medication to them for review?  I have all other medications ready to send. Okay to send.

## 2019-08-19 NOTE — Assessment & Plan Note (Signed)
Doing well on omeprazole 40 mg daily, continue same.  

## 2019-08-19 NOTE — Patient Instructions (Signed)
Be sure to take your levothyroxine (thyroid medication) every morning on an empty stomach with water only. No food or other medications for 30 minutes. No heartburn medication, iron pills, calcium, vitamin D, or magnesium pills within four hours of taking levothyroxine.   Call the office to schedule a lab appointment for thyroid check in 2 months and then a lab appointment for kidney check in 4 months.  It was a pleasure to see you today!

## 2019-08-19 NOTE — Assessment & Plan Note (Signed)
Managed on omeprazole 40 mg for choking, doing okay on 40 mg. Continue same.

## 2019-08-19 NOTE — Progress Notes (Signed)
Subjective:    Patient ID: Kathleen Paul, female    DOB: 1936/04/22, 84 y.o.   MRN: [id number]  HPI     Kathleen Paul - 84 y.o. female  MRN [id number]  Date of Birth: 1936-02-11  PCP: Kathleen Koch, NP  This service was provided via telemedicine. Phone Visit performed on 08/19/2019    Rationale for phone visit along with limitations reviewed. Patient consented to telephone encounter.    Location of patient: Home Location of provider: Office Kathleen Paul @ [facility] Name of referring provider: N/A   Names of persons and role in encounter: Provider: [name], NP  Patient: Kathleen Paul  Other: N/A   Time on call: 11 min - 32 sec   Subjective: No chief complaint on file.    HPI:  Kathleen Paul is a 84 year old female who presents today for follow up.  1) Hypertension: Currently managed enalapril-HCTZ 10-25 mg and metoprolol tartrate 25 mg BID.  BP Readings from Last 3 Encounters:  08/19/19 120/70  07/02/19 140/69  06/27/19 (!) 142/90     2) CAD/Hyperliidemia: Currently managed on Simvastatin 20 mg. Recent lipid panel with LDL of 71. She denies chest pain.  3) Chronic Osteoarthritis: Chronic to hands, knees, feet. Bilateral open patella tendon repair in August, November and December 2020, Dr. Alvan Dame. Currently managed in a left knee brace and will continue for an additional five weeks.   She continues to struggle with her left knee, has had recurrent surgery. We will starting PT this week, she may need some Tramadol to get through PT due to pain. Mostly taking Tylenol and methocarbamol.   4) Hypothyroidism: Currently managed on levothyroxine 88 mcg. Recent TSH of 6.22. She is taking her levothyroxine every morning with water, omeprazole, and other medications.   5) CKD: Recent creatinine of 1.18 and GFR of 43. She denies NSAID use. CBC with hemoglobin of 10.9.   Objective/Observations:  No physical exam or vital signs collected unless specifically  identified below.   There were no vitals taken for this visit.   Respiratory status: speaks in complete sentences without evident shortness of breath.   Assessment/Plan:  See problem based charting.  No problem-specific Assessment & Plan notes found for this encounter.   I discussed the assessment and treatment plan with the patient. The patient was provided an opportunity to ask questions and all were answered. The patient agreed with the plan and demonstrated an understanding of the instructions.  Lab Orders  No laboratory test(s) ordered today    No orders of the defined types were placed in this encounter.   The patient was advised to call back or seek an in-person evaluation if the symptoms worsen or if the condition fails to improve as anticipated.  Kathleen Koch, NP    Review of Systems  Constitutional: Negative for fever.  Eyes: Negative for visual disturbance.  Respiratory: Negative for shortness of breath.   Cardiovascular: Negative for chest pain.  Musculoskeletal: Positive for arthralgias.  Neurological: Negative for dizziness and headaches.       Past Medical History:  Diagnosis Date  . Anxiety state, unspecified   . Arthritis   . Atherosclerosis   . Cancer ([place])    skin cancer  . Diverticulosis   . Esophageal stricture   . Essential hypertension, benign   . Gastric ulcer   . Hiatal hernia   . History of DVT (deep vein thrombosis) yrs ago   legs  .  IDA (iron deficiency anemia)   . Inguinal hernia without mention of obstruction or gangrene, unilateral or unspecified, (not specified as recurrent)    2014  . Internal hemorrhoids   . Mild mitral regurgitation    a. 04/2010 Echo: nl EF, mild diast dysfxn, trace PR, mild TR, mild-mod MR.  . Non-obstructive CAD    a. 04/2010 Myoview: EF 77%, fixed apical/inferior defect;  b. 04/2010 Cath: LAD 40m, LCX/RCA minor irregs, EF 60%;  c. 04/2011 Myoview: low risk.  . Pure hypercholesterolemia   . Rupture  of left patellar tendon    05-26-2019  . Scoliosis   . UI (urinary incontinence)   . Umbilical hernia without mention of obstruction or gangrene    2014  . Unspecified hypothyroidism   . Vitamin D deficiency      Social History   Socioeconomic History  . Marital status: Widowed    Spouse name: Not on file  . Number of children: Not on file  . Years of education: Not on file  . Highest education level: Not on file  Occupational History  . Not on file  Tobacco Use  . Smoking status: Former Smoker    Packs/day: 0.50    Years: 30.00    Pack years: 15.00    Types: Cigarettes    Quit date: 07/10/1988    Years since quitting: 31.1  . Smokeless tobacco: Never Used  Substance and Sexual Activity  . Alcohol use: No  . Drug use: Never  . Sexual activity: Not on file  Other Topics Concern  . Not on file  Social History Narrative  . Not on file   Social Determinants of Health   Financial Resource Strain:   . Difficulty of Paying Living Expenses: Not on file  Food Insecurity:   . Worried About Charity fundraiser in the Last Year: Not on file  . Ran Out of Food in the Last Year: Not on file  Transportation Needs:   . Lack of Transportation (Medical): Not on file  . Lack of Transportation (Non-Medical): Not on file  Physical Activity:   . Days of Exercise per Week: Not on file  . Minutes of Exercise per Session: Not on file  Stress:   . Feeling of Stress : Not on file  Social Connections:   . Frequency of Communication with Friends and Family: Not on file  . Frequency of Social Gatherings with Friends and Family: Not on file  . Attends Religious Services: Not on file  . Active Member of Clubs or Organizations: Not on file  . Attends Archivist Meetings: Not on file  . Marital Status: Not on file  Intimate Partner Violence:   . Fear of Current or Ex-Partner: Not on file  . Emotionally Abused: Not on file  . Physically Abused: Not on file  . Sexually Abused: Not  on file    Past Surgical History:  Procedure Laterality Date  . ABDOMINAL HYSTERECTOMY  2008   complete   . BIOPSY  04/02/2018   Procedure: BIOPSY;  Surgeon: Rush Landmark Telford Nab., MD;  Location: [facility];  Service: Gastroenterology;;  . CARDIAC CATHETERIZATION  2011   [facility]  . COLONOSCOPY  2003   [facility]  . ESOPHAGOGASTRODUODENOSCOPY (EGD) WITH PROPOFOL N/A 04/02/2018   Procedure: ESOPHAGOGASTRODUODENOSCOPY (EGD) WITH PROPOFOL;  Surgeon: Rush Landmark Telford Nab., MD;  Location: [facility];  Service: Gastroenterology;  Laterality: N/A;  . FOREIGN BODY REMOVAL  04/02/2018   Procedure: FOREIGN BODY REMOVAL;  Surgeon: Irving Copas., MD;  Location: [facility];  Service: Gastroenterology;;  . HERNIA REPAIR Right 09/23/2012   repair RIH  . LAPAROSCOPIC HYSTERECTOMY    . PATELLAR TENDON REPAIR Left 06/03/2019   Procedure: LEFT PATELLA TENDON REPAIR, RIGHT KNEE INJECTION.;  Surgeon: Paralee Cancel, MD;  Location: [facility];  Service: Orthopedics;  Laterality: Left;  . PATELLAR TENDON REPAIR Left 07/01/2019   Procedure: OPEN PATELLA TENDON REPAIR;  Surgeon: Paralee Cancel, MD;  Location: [facility];  Service: Orthopedics;  Laterality: Left;  . TONSILLECTOMY    . TOTAL KNEE ARTHROPLASTY Left 02/11/2019   Procedure: TOTAL KNEE ARTHROPLASTY;  Surgeon: Paralee Cancel, MD;  Location: [facility];  Service: Orthopedics;  Laterality: Left;  70 mins    Family History  Problem Relation Age of Onset  . Heart disease Mother   . Heart attack Mother   . Heart disease Father   . Heart attack Father   . Breast cancer Sister   . Breast cancer Maternal Aunt   . Heart disease Brother        sister    Allergies  Allergen Reactions  . Codeine Nausea Only    Current Outpatient Medications on File Prior to Visit  Medication Sig Dispense Refill  . acetaminophen (TYLENOL) 500 MG tablet Take 2 tablets (1,000 mg total) by mouth every 8 (eight) hours. 30 tablet 0  . Calcium-Magnesium-Vitamin D  (CALCIUM 1200+D3 PO) Take 1 tablet by mouth daily.    . Carboxymethylcellul-Glycerin (LUBRICATING EYE DROPS OP) Place 1 drop into both eyes daily as needed (dry eyes).    Marland Kitchen docusate sodium (COLACE) 100 MG capsule Take 1 capsule (100 mg total) by mouth 2 (two) times daily. 28 capsule 0  . enalapril-hydrochlorothiazide (VASERETIC) 10-25 MG tablet Take 1 tablet by mouth daily.    Marland Kitchen levothyroxine (SYNTHROID) 88 MCG tablet TAKE 1 TABLET BY MOUTH EVERY MORNING ON AN EMPTY STOMACH AND WITH A FULL GLASS OF WATER (Patient taking differently: Take 88 mcg by mouth daily before breakfast. ) 90 tablet 0  . methocarbamol (ROBAXIN) 500 MG tablet Take 1 tablet (500 mg total) by mouth every 6 (six) hours as needed for muscle spasms. 40 tablet 0  . metoprolol tartrate (LOPRESSOR) 25 MG tablet TAKE 1 TABLET BY MOUTH EVERY EVENING (Patient taking differently: Take 25 mg by mouth every evening. ) 90 tablet 3  . Multiple Vitamin (MULTIVITAMIN PO) Take 1 tablet by mouth daily.    Marland Kitchen omeprazole (PRILOSEC) 40 MG capsule Take 1 capsule (40 mg total) by mouth 2 (two) times daily before a meal. (Patient taking differently: Take 40 mg by mouth daily. ) 180 capsule 1  . OVER THE COUNTER MEDICATION Apply 1 application topically 2 (two) times daily as needed (for pain). OxyRub Cream     . polyethylene glycol (MIRALAX / GLYCOLAX) 17 g packet Take 17 g by mouth 2 (two) times daily. 28 packet 0  . simvastatin (ZOCOR) 20 MG tablet TAKE 1 TABLET BY MOUTH EVERY DAY FOR CHOLESTEROL (Patient taking differently: Take 20 mg by mouth every evening. ) 90 tablet 0  . ferrous sulfate (FERROUSUL) 325 (65 FE) MG tablet Take 1 tablet (325 mg total) by mouth 3 (three) times daily with meals for 14 days. 42 tablet 0   No current facility-administered medications on file prior to visit.    BP 120/70   Pulse 60    Objective:   Physical Exam  Constitutional: She is oriented to person, place, and time.  Respiratory:  Effort normal.  Neurological:  She is alert and oriented to person, place, and time.  Psychiatric: She has a normal mood and affect.           Assessment & Plan:

## 2019-08-19 NOTE — Assessment & Plan Note (Signed)
Taking levothyroxine incorrectly with PPI and other medications.  Discussed to take levothyroxine (thyroid medication) every morning on an empty stomach with water only. No food or other medications for 30 minutes. No heartburn medication, iron pills, calcium, vitamin D, or magnesium pills within four hours of taking levothyroxine.   Repeat TSH in 2 months. Continue 88 mcg dose.

## 2019-08-19 NOTE — Assessment & Plan Note (Signed)
Recent level stable, continue current regimen. CMP reviewed.

## 2019-08-19 NOTE — Assessment & Plan Note (Signed)
Recent creatinine and GFR stable. Continue to monitor. Repeat in 4 months.

## 2019-08-19 NOTE — Assessment & Plan Note (Signed)
Asymptomatic. Continue lipid control, BP control.

## 2019-08-19 NOTE — Assessment & Plan Note (Signed)
Following with orthopedics, numerous surgeries to left knee, also with repair to right knee.  Will undergo PT soon. I did agree to provide her with some Tramadol to use if needed before and during PT.  She will call to update.

## 2019-08-20 NOTE — Telephone Encounter (Signed)
Unable to sent request through Epic. I have called [facility] to sent to request for the metoprolol to Dr Nevin Bloodgood Ross's office  Will send rest as instructed.

## 2019-08-21 ENCOUNTER — Other Ambulatory Visit: Payer: Self-pay | Admitting: Primary Care

## 2019-08-21 DIAGNOSIS — I10 Essential (primary) hypertension: Secondary | ICD-10-CM

## 2019-08-21 NOTE — Telephone Encounter (Signed)
Spoken to patient and she stated that she have taken 25 mg at bedtime.  She stated that she saw Dr Harrington Challenger since August 2020 but according to the chart, she saw the NP for surgical clearance in July 2020. Patient have not seen Dr Harrington Challenger since 2019.  I have asked [facility] to sent this cardiology as well but still getting the request.  Please advise.

## 2019-08-21 NOTE — Telephone Encounter (Signed)
Noted, refill sent to pharmacy. Based off of the office note from her surgical clearance they did not mention once daily or BID. We will have to assume they reviewed her medication list and wanted her to continue nightly dosing.

## 2019-08-25 ENCOUNTER — Other Ambulatory Visit: Payer: Self-pay | Admitting: Internal Medicine

## 2019-08-25 NOTE — Telephone Encounter (Signed)
Patient is requesting refill on Omeprazole 40mg  states she only takes it once a day and that she can not come in due to knee surgeries.

## 2019-08-26 DIAGNOSIS — M1711 Unilateral primary osteoarthritis, right knee: Secondary | ICD-10-CM | POA: Diagnosis not present

## 2019-08-26 DIAGNOSIS — T84093D Other mechanical complication of internal left knee prosthesis, subsequent encounter: Secondary | ICD-10-CM | POA: Diagnosis not present

## 2019-08-26 DIAGNOSIS — D509 Iron deficiency anemia, unspecified: Secondary | ICD-10-CM | POA: Diagnosis not present

## 2019-08-26 DIAGNOSIS — Z87891 Personal history of nicotine dependence: Secondary | ICD-10-CM | POA: Diagnosis not present

## 2019-08-26 DIAGNOSIS — I340 Nonrheumatic mitral (valve) insufficiency: Secondary | ICD-10-CM | POA: Diagnosis not present

## 2019-08-26 DIAGNOSIS — Z9181 History of falling: Secondary | ICD-10-CM | POA: Diagnosis not present

## 2019-08-26 DIAGNOSIS — M75101 Unspecified rotator cuff tear or rupture of right shoulder, not specified as traumatic: Secondary | ICD-10-CM | POA: Diagnosis not present

## 2019-08-26 DIAGNOSIS — M75102 Unspecified rotator cuff tear or rupture of left shoulder, not specified as traumatic: Secondary | ICD-10-CM | POA: Diagnosis not present

## 2019-08-26 DIAGNOSIS — T84023D Instability of internal left knee prosthesis, subsequent encounter: Secondary | ICD-10-CM | POA: Diagnosis not present

## 2019-08-26 DIAGNOSIS — I2510 Atherosclerotic heart disease of native coronary artery without angina pectoris: Secondary | ICD-10-CM | POA: Diagnosis not present

## 2019-08-26 DIAGNOSIS — K2100 Gastro-esophageal reflux disease with esophagitis, without bleeding: Secondary | ICD-10-CM | POA: Diagnosis not present

## 2019-08-26 DIAGNOSIS — M419 Scoliosis, unspecified: Secondary | ICD-10-CM | POA: Diagnosis not present

## 2019-08-26 DIAGNOSIS — K429 Umbilical hernia without obstruction or gangrene: Secondary | ICD-10-CM | POA: Diagnosis not present

## 2019-08-26 DIAGNOSIS — E785 Hyperlipidemia, unspecified: Secondary | ICD-10-CM | POA: Diagnosis not present

## 2019-08-26 DIAGNOSIS — I129 Hypertensive chronic kidney disease with stage 1 through stage 4 chronic kidney disease, or unspecified chronic kidney disease: Secondary | ICD-10-CM | POA: Diagnosis not present

## 2019-08-26 DIAGNOSIS — R32 Unspecified urinary incontinence: Secondary | ICD-10-CM | POA: Diagnosis not present

## 2019-08-26 DIAGNOSIS — Z86718 Personal history of other venous thrombosis and embolism: Secondary | ICD-10-CM | POA: Diagnosis not present

## 2019-08-26 DIAGNOSIS — N189 Chronic kidney disease, unspecified: Secondary | ICD-10-CM | POA: Diagnosis not present

## 2019-08-26 DIAGNOSIS — K449 Diaphragmatic hernia without obstruction or gangrene: Secondary | ICD-10-CM | POA: Diagnosis not present

## 2019-08-26 DIAGNOSIS — K5790 Diverticulosis of intestine, part unspecified, without perforation or abscess without bleeding: Secondary | ICD-10-CM | POA: Diagnosis not present

## 2019-08-26 DIAGNOSIS — F419 Anxiety disorder, unspecified: Secondary | ICD-10-CM | POA: Diagnosis not present

## 2019-08-26 DIAGNOSIS — E039 Hypothyroidism, unspecified: Secondary | ICD-10-CM | POA: Diagnosis not present

## 2019-08-26 DIAGNOSIS — Z7982 Long term (current) use of aspirin: Secondary | ICD-10-CM | POA: Diagnosis not present

## 2019-08-29 ENCOUNTER — Other Ambulatory Visit: Payer: Self-pay

## 2019-08-29 ENCOUNTER — Other Ambulatory Visit: Payer: Self-pay | Admitting: Internal Medicine

## 2019-08-29 MED ORDER — OMEPRAZOLE 40 MG PO CPDR: 40.0000 mg | DELAYED_RELEASE_CAPSULE | Freq: Every day | ORAL | 0 refills | Status: DC

## 2019-08-29 NOTE — Telephone Encounter (Signed)
Left message for patient to call back. Unfortunately, she will need an appointment whether virtual or in office for further refills. She was to get endoscopy after her last visit 05/2018 for further evaluation of her previous symptoms but never followed through with that. We need to follow up on those symptoms and possibly still with a procedure pending her current situation. If she does not wish to follow up with a visit in one of those two formats, she can get medication over the counter.

## 2019-08-29 NOTE — Telephone Encounter (Signed)
Sent 30 day supply of omeprazole 40mg  once daily and instructed pt to schedule a virtual visit due to recent knee surgery.

## 2019-09-02 DIAGNOSIS — S76192D Other specified injury of left quadriceps muscle, fascia and tendon, subsequent encounter: Secondary | ICD-10-CM | POA: Diagnosis not present

## 2019-09-02 DIAGNOSIS — I340 Nonrheumatic mitral (valve) insufficiency: Secondary | ICD-10-CM | POA: Diagnosis not present

## 2019-09-02 DIAGNOSIS — I129 Hypertensive chronic kidney disease with stage 1 through stage 4 chronic kidney disease, or unspecified chronic kidney disease: Secondary | ICD-10-CM | POA: Diagnosis not present

## 2019-09-02 DIAGNOSIS — M75102 Unspecified rotator cuff tear or rupture of left shoulder, not specified as traumatic: Secondary | ICD-10-CM | POA: Diagnosis not present

## 2019-09-02 DIAGNOSIS — I2510 Atherosclerotic heart disease of native coronary artery without angina pectoris: Secondary | ICD-10-CM | POA: Diagnosis not present

## 2019-09-02 DIAGNOSIS — Z86718 Personal history of other venous thrombosis and embolism: Secondary | ICD-10-CM | POA: Diagnosis not present

## 2019-09-02 DIAGNOSIS — T84023D Instability of internal left knee prosthesis, subsequent encounter: Secondary | ICD-10-CM | POA: Diagnosis not present

## 2019-09-02 DIAGNOSIS — K429 Umbilical hernia without obstruction or gangrene: Secondary | ICD-10-CM | POA: Diagnosis not present

## 2019-09-02 DIAGNOSIS — Z87891 Personal history of nicotine dependence: Secondary | ICD-10-CM | POA: Diagnosis not present

## 2019-09-02 DIAGNOSIS — M75101 Unspecified rotator cuff tear or rupture of right shoulder, not specified as traumatic: Secondary | ICD-10-CM | POA: Diagnosis not present

## 2019-09-02 DIAGNOSIS — K5790 Diverticulosis of intestine, part unspecified, without perforation or abscess without bleeding: Secondary | ICD-10-CM | POA: Diagnosis not present

## 2019-09-02 DIAGNOSIS — E785 Hyperlipidemia, unspecified: Secondary | ICD-10-CM | POA: Diagnosis not present

## 2019-09-02 DIAGNOSIS — M1711 Unilateral primary osteoarthritis, right knee: Secondary | ICD-10-CM | POA: Diagnosis not present

## 2019-09-02 DIAGNOSIS — K449 Diaphragmatic hernia without obstruction or gangrene: Secondary | ICD-10-CM | POA: Diagnosis not present

## 2019-09-02 DIAGNOSIS — D509 Iron deficiency anemia, unspecified: Secondary | ICD-10-CM | POA: Diagnosis not present

## 2019-09-02 DIAGNOSIS — Z7982 Long term (current) use of aspirin: Secondary | ICD-10-CM | POA: Diagnosis not present

## 2019-09-02 DIAGNOSIS — N189 Chronic kidney disease, unspecified: Secondary | ICD-10-CM | POA: Diagnosis not present

## 2019-09-02 DIAGNOSIS — M419 Scoliosis, unspecified: Secondary | ICD-10-CM | POA: Diagnosis not present

## 2019-09-02 DIAGNOSIS — E039 Hypothyroidism, unspecified: Secondary | ICD-10-CM | POA: Diagnosis not present

## 2019-09-02 DIAGNOSIS — M1712 Unilateral primary osteoarthritis, left knee: Secondary | ICD-10-CM | POA: Diagnosis not present

## 2019-09-02 DIAGNOSIS — Z9181 History of falling: Secondary | ICD-10-CM | POA: Diagnosis not present

## 2019-09-02 DIAGNOSIS — F419 Anxiety disorder, unspecified: Secondary | ICD-10-CM | POA: Diagnosis not present

## 2019-09-02 DIAGNOSIS — K2100 Gastro-esophageal reflux disease with esophagitis, without bleeding: Secondary | ICD-10-CM | POA: Diagnosis not present

## 2019-09-02 DIAGNOSIS — T84093D Other mechanical complication of internal left knee prosthesis, subsequent encounter: Secondary | ICD-10-CM | POA: Diagnosis not present

## 2019-09-02 DIAGNOSIS — R32 Unspecified urinary incontinence: Secondary | ICD-10-CM | POA: Diagnosis not present

## 2019-09-09 DIAGNOSIS — Z86718 Personal history of other venous thrombosis and embolism: Secondary | ICD-10-CM | POA: Diagnosis not present

## 2019-09-09 DIAGNOSIS — Z9181 History of falling: Secondary | ICD-10-CM | POA: Diagnosis not present

## 2019-09-09 DIAGNOSIS — F419 Anxiety disorder, unspecified: Secondary | ICD-10-CM | POA: Diagnosis not present

## 2019-09-09 DIAGNOSIS — T84023D Instability of internal left knee prosthesis, subsequent encounter: Secondary | ICD-10-CM | POA: Diagnosis not present

## 2019-09-09 DIAGNOSIS — K429 Umbilical hernia without obstruction or gangrene: Secondary | ICD-10-CM | POA: Diagnosis not present

## 2019-09-09 DIAGNOSIS — E785 Hyperlipidemia, unspecified: Secondary | ICD-10-CM | POA: Diagnosis not present

## 2019-09-09 DIAGNOSIS — M75102 Unspecified rotator cuff tear or rupture of left shoulder, not specified as traumatic: Secondary | ICD-10-CM | POA: Diagnosis not present

## 2019-09-09 DIAGNOSIS — E039 Hypothyroidism, unspecified: Secondary | ICD-10-CM | POA: Diagnosis not present

## 2019-09-09 DIAGNOSIS — Z7982 Long term (current) use of aspirin: Secondary | ICD-10-CM | POA: Diagnosis not present

## 2019-09-09 DIAGNOSIS — N189 Chronic kidney disease, unspecified: Secondary | ICD-10-CM | POA: Diagnosis not present

## 2019-09-09 DIAGNOSIS — M1711 Unilateral primary osteoarthritis, right knee: Secondary | ICD-10-CM | POA: Diagnosis not present

## 2019-09-09 DIAGNOSIS — I2510 Atherosclerotic heart disease of native coronary artery without angina pectoris: Secondary | ICD-10-CM | POA: Diagnosis not present

## 2019-09-09 DIAGNOSIS — I340 Nonrheumatic mitral (valve) insufficiency: Secondary | ICD-10-CM | POA: Diagnosis not present

## 2019-09-09 DIAGNOSIS — R32 Unspecified urinary incontinence: Secondary | ICD-10-CM | POA: Diagnosis not present

## 2019-09-09 DIAGNOSIS — I129 Hypertensive chronic kidney disease with stage 1 through stage 4 chronic kidney disease, or unspecified chronic kidney disease: Secondary | ICD-10-CM | POA: Diagnosis not present

## 2019-09-09 DIAGNOSIS — D509 Iron deficiency anemia, unspecified: Secondary | ICD-10-CM | POA: Diagnosis not present

## 2019-09-09 DIAGNOSIS — K2100 Gastro-esophageal reflux disease with esophagitis, without bleeding: Secondary | ICD-10-CM | POA: Diagnosis not present

## 2019-09-09 DIAGNOSIS — M75101 Unspecified rotator cuff tear or rupture of right shoulder, not specified as traumatic: Secondary | ICD-10-CM | POA: Diagnosis not present

## 2019-09-09 DIAGNOSIS — K449 Diaphragmatic hernia without obstruction or gangrene: Secondary | ICD-10-CM | POA: Diagnosis not present

## 2019-09-09 DIAGNOSIS — T84093D Other mechanical complication of internal left knee prosthesis, subsequent encounter: Secondary | ICD-10-CM | POA: Diagnosis not present

## 2019-09-09 DIAGNOSIS — K5790 Diverticulosis of intestine, part unspecified, without perforation or abscess without bleeding: Secondary | ICD-10-CM | POA: Diagnosis not present

## 2019-09-09 DIAGNOSIS — Z87891 Personal history of nicotine dependence: Secondary | ICD-10-CM | POA: Diagnosis not present

## 2019-09-09 DIAGNOSIS — M419 Scoliosis, unspecified: Secondary | ICD-10-CM | POA: Diagnosis not present

## 2019-09-16 DIAGNOSIS — N189 Chronic kidney disease, unspecified: Secondary | ICD-10-CM | POA: Diagnosis not present

## 2019-09-16 DIAGNOSIS — I129 Hypertensive chronic kidney disease with stage 1 through stage 4 chronic kidney disease, or unspecified chronic kidney disease: Secondary | ICD-10-CM | POA: Diagnosis not present

## 2019-09-16 DIAGNOSIS — M419 Scoliosis, unspecified: Secondary | ICD-10-CM | POA: Diagnosis not present

## 2019-09-16 DIAGNOSIS — T84023D Instability of internal left knee prosthesis, subsequent encounter: Secondary | ICD-10-CM | POA: Diagnosis not present

## 2019-09-16 DIAGNOSIS — R32 Unspecified urinary incontinence: Secondary | ICD-10-CM | POA: Diagnosis not present

## 2019-09-16 DIAGNOSIS — M75102 Unspecified rotator cuff tear or rupture of left shoulder, not specified as traumatic: Secondary | ICD-10-CM | POA: Diagnosis not present

## 2019-09-16 DIAGNOSIS — I340 Nonrheumatic mitral (valve) insufficiency: Secondary | ICD-10-CM | POA: Diagnosis not present

## 2019-09-16 DIAGNOSIS — K449 Diaphragmatic hernia without obstruction or gangrene: Secondary | ICD-10-CM | POA: Diagnosis not present

## 2019-09-16 DIAGNOSIS — Z7982 Long term (current) use of aspirin: Secondary | ICD-10-CM | POA: Diagnosis not present

## 2019-09-16 DIAGNOSIS — I2510 Atherosclerotic heart disease of native coronary artery without angina pectoris: Secondary | ICD-10-CM | POA: Diagnosis not present

## 2019-09-16 DIAGNOSIS — M1711 Unilateral primary osteoarthritis, right knee: Secondary | ICD-10-CM | POA: Diagnosis not present

## 2019-09-16 DIAGNOSIS — Z87891 Personal history of nicotine dependence: Secondary | ICD-10-CM | POA: Diagnosis not present

## 2019-09-16 DIAGNOSIS — D509 Iron deficiency anemia, unspecified: Secondary | ICD-10-CM | POA: Diagnosis not present

## 2019-09-16 DIAGNOSIS — K429 Umbilical hernia without obstruction or gangrene: Secondary | ICD-10-CM | POA: Diagnosis not present

## 2019-09-16 DIAGNOSIS — M75101 Unspecified rotator cuff tear or rupture of right shoulder, not specified as traumatic: Secondary | ICD-10-CM | POA: Diagnosis not present

## 2019-09-16 DIAGNOSIS — K2100 Gastro-esophageal reflux disease with esophagitis, without bleeding: Secondary | ICD-10-CM | POA: Diagnosis not present

## 2019-09-16 DIAGNOSIS — E039 Hypothyroidism, unspecified: Secondary | ICD-10-CM | POA: Diagnosis not present

## 2019-09-16 DIAGNOSIS — K5790 Diverticulosis of intestine, part unspecified, without perforation or abscess without bleeding: Secondary | ICD-10-CM | POA: Diagnosis not present

## 2019-09-16 DIAGNOSIS — E785 Hyperlipidemia, unspecified: Secondary | ICD-10-CM | POA: Diagnosis not present

## 2019-09-16 DIAGNOSIS — F419 Anxiety disorder, unspecified: Secondary | ICD-10-CM | POA: Diagnosis not present

## 2019-09-16 DIAGNOSIS — Z86718 Personal history of other venous thrombosis and embolism: Secondary | ICD-10-CM | POA: Diagnosis not present

## 2019-09-16 DIAGNOSIS — T84093D Other mechanical complication of internal left knee prosthesis, subsequent encounter: Secondary | ICD-10-CM | POA: Diagnosis not present

## 2019-09-16 DIAGNOSIS — Z9181 History of falling: Secondary | ICD-10-CM | POA: Diagnosis not present

## 2019-09-22 DIAGNOSIS — M170 Bilateral primary osteoarthritis of knee: Secondary | ICD-10-CM

## 2019-09-23 MED ORDER — TRAMADOL HCL 50 MG PO TABS: 50.0000 mg | ORAL_TABLET | Freq: Two times a day (BID) | ORAL | 0 refills | Status: DC | PRN

## 2019-09-23 NOTE — Telephone Encounter (Signed)
I have called patient and she stated that Dr Guinevere Scarlet was last to prescribed this. Patient stated that she would need a refill from pcp. She stated that it is okay for her take this. She was told that she can.

## 2019-09-24 DIAGNOSIS — Z4789 Encounter for other orthopedic aftercare: Secondary | ICD-10-CM | POA: Diagnosis not present

## 2019-09-24 DIAGNOSIS — M66862 Spontaneous rupture of other tendons, left lower leg: Secondary | ICD-10-CM | POA: Diagnosis not present

## 2019-09-30 DIAGNOSIS — S76192D Other specified injury of left quadriceps muscle, fascia and tendon, subsequent encounter: Secondary | ICD-10-CM | POA: Diagnosis not present

## 2019-09-30 DIAGNOSIS — M1712 Unilateral primary osteoarthritis, left knee: Secondary | ICD-10-CM | POA: Diagnosis not present

## 2019-10-03 ENCOUNTER — Other Ambulatory Visit: Payer: Self-pay | Admitting: Primary Care

## 2019-10-03 DIAGNOSIS — E039 Hypothyroidism, unspecified: Secondary | ICD-10-CM

## 2019-10-15 ENCOUNTER — Other Ambulatory Visit (INDEPENDENT_AMBULATORY_CARE_PROVIDER_SITE_OTHER): Payer: PPO

## 2019-10-15 ENCOUNTER — Other Ambulatory Visit: Payer: Self-pay

## 2019-10-15 DIAGNOSIS — E039 Hypothyroidism, unspecified: Secondary | ICD-10-CM | POA: Diagnosis not present

## 2019-10-15 LAB — TSH: TSH: 3.55 u[IU]/mL (ref 0.35–4.50)

## 2019-10-17 ENCOUNTER — Other Ambulatory Visit: Payer: PPO

## 2019-10-30 ENCOUNTER — Other Ambulatory Visit: Payer: Self-pay | Admitting: Primary Care

## 2019-10-31 DIAGNOSIS — S76192D Other specified injury of left quadriceps muscle, fascia and tendon, subsequent encounter: Secondary | ICD-10-CM | POA: Diagnosis not present

## 2019-10-31 DIAGNOSIS — M1712 Unilateral primary osteoarthritis, left knee: Secondary | ICD-10-CM | POA: Diagnosis not present

## 2019-11-03 ENCOUNTER — Other Ambulatory Visit: Payer: PPO

## 2019-11-03 DIAGNOSIS — Z96652 Presence of left artificial knee joint: Secondary | ICD-10-CM | POA: Diagnosis not present

## 2019-11-03 DIAGNOSIS — Z471 Aftercare following joint replacement surgery: Secondary | ICD-10-CM | POA: Diagnosis not present

## 2019-11-03 DIAGNOSIS — M66862 Spontaneous rupture of other tendons, left lower leg: Secondary | ICD-10-CM | POA: Diagnosis not present

## 2019-11-04 ENCOUNTER — Ambulatory Visit: Payer: PPO

## 2019-11-05 ENCOUNTER — Ambulatory Visit (INDEPENDENT_AMBULATORY_CARE_PROVIDER_SITE_OTHER): Payer: PPO

## 2019-11-05 ENCOUNTER — Other Ambulatory Visit: Payer: Self-pay

## 2019-11-05 VITALS — BP 110/78 | HR 62 | Wt 140.0 lb

## 2019-11-05 DIAGNOSIS — Z0000 Encounter for general adult medical examination without abnormal findings: Secondary | ICD-10-CM | POA: Diagnosis not present

## 2019-11-05 NOTE — Progress Notes (Signed)
PCP notes:  Health Maintenance: Tdap- insurance/financial Dexa- decline   Abnormal Screenings: none   Patient concerns: none   Nurse concerns: none   Next PCP appt.: 11/19/2019 @ 9:20 am

## 2019-11-05 NOTE — Progress Notes (Signed)
Subjective:   Kathleen Paul is a 84 y.o. female who presents for an Initial Medicare Annual Wellness Visit.  Review of Systems: N/A     This visit is being conducted through telemedicine via telephone at the nurse health advisor's home address due to the COVID-19 pandemic. This patient has given me verbal consent via doximity to conduct this visit, patient states they are participating from their home address. Patient and myself are on the telephone call. There is no referral for this visit. Some vital signs may be absent or patient reported.    Patient identification: identified by name, DOB, and current address    Cardiac Risk Factors include: advanced age (>75men, >63 women);hypertension;dyslipidemia     Objective:    Today's Vitals   11/05/19 1455  BP: 110/78  Pulse: 62  Weight: 140 lb (63.5 kg)   Body mass index is 27.34 kg/m.  Advanced Directives 11/05/2019 07/01/2019 07/01/2019 06/27/2019 06/04/2019 06/03/2019 06/02/2019  Does Patient Have a Medical Advance Directive? Yes Yes Yes Yes Yes Yes Yes  Type of Paramedic of [place];Living will [place];Living will [place];Living will Living will;Healthcare Power of [place];Living will [place];Living will  Does patient want to make changes to medical advance directive? - No - Patient declined No - Patient declined No - Patient declined - No - Patient declined No - Patient declined  Copy of [place] in Chart? Yes - validated most recent copy scanned in chart (See row information) - No - copy requested No - copy requested - No - copy requested -    Current Medications (verified) Outpatient Encounter Medications as of 11/05/2019  Medication Sig  . acetaminophen (TYLENOL) 500 MG tablet Take 2 tablets (1,000 mg total) by mouth every 8 (eight) hours.  . Calcium-Magnesium-Vitamin D (CALCIUM  1200+D3 PO) Take 1 tablet by mouth daily.  . Carboxymethylcellul-Glycerin (LUBRICATING EYE DROPS OP) Place 1 drop into both eyes daily as needed (dry eyes).  Marland Kitchen docusate sodium (COLACE) 100 MG capsule Take 1 capsule (100 mg total) by mouth 2 (two) times daily.  . enalapril-hydrochlorothiazide (VASERETIC) 10-25 MG tablet TAKE 1 TABLET BY MOUTH DAILY  . levothyroxine (SYNTHROID) 88 MCG tablet Take 1 tablet by mouth every morning on an empty stomach with water only.  No food or other medications for 30 minutes.  . methocarbamol (ROBAXIN) 500 MG tablet Take 1 tablet (500 mg total) by mouth every 6 (six) hours as needed for muscle spasms.  . metoprolol tartrate (LOPRESSOR) 25 MG tablet TAKE 1 TABLET BY MOUTH EVERY EVENING  . Multiple Vitamin (MULTIVITAMIN PO) Take 1 tablet by mouth daily.  Marland Kitchen omeprazole (PRILOSEC) 40 MG capsule Take 1 capsule (40 mg total) by mouth daily. Please call to schedule a virtual visit with Dr. Hilarie Fredrickson for any further refills. Thank you  . OVER THE COUNTER MEDICATION Apply 1 application topically 2 (two) times daily as needed (for pain). OxyRub Cream   . polyethylene glycol (MIRALAX / GLYCOLAX) 17 g packet Take 17 g by mouth 2 (two) times daily.  . simvastatin (ZOCOR) 20 MG tablet Take 1 tablet (20 mg total) by mouth every evening. For cholesterol.  . traMADol (ULTRAM) 50 MG tablet Take 1 tablet (50 mg total) by mouth every 12 (twelve) hours as needed for severe pain.  . ferrous sulfate (FERROUSUL) 325 (65 FE) MG tablet Take 1 tablet (325 mg total) by mouth 3 (three)  times daily with meals for 14 days.   No facility-administered encounter medications on file as of 11/05/2019.    Allergies (verified) Codeine   History: Past Medical History:  Diagnosis Date  . Anxiety state, unspecified   . Arthritis   . Atherosclerosis   . Cancer ([place])    skin cancer  . Diverticulosis   . Esophageal stricture   . Essential hypertension, benign   . Gastric ulcer   . Hiatal hernia   .  History of DVT (deep vein thrombosis) yrs ago   legs  . IDA (iron deficiency anemia)   . Inguinal hernia without mention of obstruction or gangrene, unilateral or unspecified, (not specified as recurrent)    2014  . Internal hemorrhoids   . Mild mitral regurgitation    a. 04/2010 Echo: nl EF, mild diast dysfxn, trace PR, mild TR, mild-mod MR.  . Non-obstructive CAD    a. 04/2010 Myoview: EF 77%, fixed apical/inferior defect;  b. 04/2010 Cath: LAD 76m, LCX/RCA minor irregs, EF 60%;  c. 04/2011 Myoview: low risk.  . Pure hypercholesterolemia   . Rupture of left patellar tendon    05-26-2019  . Scoliosis   . UI (urinary incontinence)   . Umbilical hernia without mention of obstruction or gangrene    2014  . Unspecified hypothyroidism   . Vitamin D deficiency    Past Surgical History:  Procedure Laterality Date  . ABDOMINAL HYSTERECTOMY  2008   complete   . BIOPSY  04/02/2018   Procedure: BIOPSY;  Surgeon: Rush Landmark Telford Nab., MD;  Location: [facility];  Service: Gastroenterology;;  . CARDIAC CATHETERIZATION  2011   [facility]  . COLONOSCOPY  2003   [facility]  . ESOPHAGOGASTRODUODENOSCOPY (EGD) WITH PROPOFOL N/A 04/02/2018   Procedure: ESOPHAGOGASTRODUODENOSCOPY (EGD) WITH PROPOFOL;  Surgeon: Rush Landmark Telford Nab., MD;  Location: [facility];  Service: Gastroenterology;  Laterality: N/A;  . FOREIGN BODY REMOVAL  04/02/2018   Procedure: FOREIGN BODY REMOVAL;  Surgeon: Rush Landmark Telford Nab., MD;  Location: [facility];  Service: Gastroenterology;;  . HERNIA REPAIR Right 09/23/2012   repair RIH  . LAPAROSCOPIC HYSTERECTOMY    . PATELLAR TENDON REPAIR Left 06/03/2019   Procedure: LEFT PATELLA TENDON REPAIR, RIGHT KNEE INJECTION.;  Surgeon: Paralee Cancel, MD;  Location: [facility];  Service: Orthopedics;  Laterality: Left;  . PATELLAR TENDON REPAIR Left 07/01/2019   Procedure: OPEN PATELLA TENDON REPAIR;  Surgeon: Paralee Cancel, MD;  Location: [facility];  Service: Orthopedics;   Laterality: Left;  . TONSILLECTOMY    . TOTAL KNEE ARTHROPLASTY Left 02/11/2019   Procedure: TOTAL KNEE ARTHROPLASTY;  Surgeon: Paralee Cancel, MD;  Location: [facility];  Service: Orthopedics;  Laterality: Left;  70 mins   Family History  Problem Relation Age of Onset  . Heart disease Mother   . Heart attack Mother   . Heart disease Father   . Heart attack Father   . Breast cancer Sister   . Breast cancer Maternal Aunt   . Heart disease Brother        sister   Social History   Socioeconomic History  . Marital status: Widowed    Spouse name: Not on file  . Number of children: Not on file  . Years of education: Not on file  . Highest education level: Not on file  Occupational History  . Not on file  Tobacco Use  . Smoking status: Former Smoker    Packs/day: 0.50    Years: 30.00    Pack years:  15.00    Types: Cigarettes    Quit date: 07/10/1988    Years since quitting: 31.3  . Smokeless tobacco: Never Used  Substance and Sexual Activity  . Alcohol use: No  . Drug use: Never  . Sexual activity: Not on file  Other Topics Concern  . Not on file  Social History Narrative  . Not on file   Social Determinants of Health   Financial Resource Strain: Low Risk   . Difficulty of Paying Living Expenses: Not hard at all  Food Insecurity: No Food Insecurity  . Worried About Charity fundraiser in the Last Year: Never true  . Ran Out of Food in the Last Year: Never true  Transportation Needs: No Transportation Needs  . Lack of Transportation (Medical): No  . Lack of Transportation (Non-Medical): No  Physical Activity: Inactive  . Days of Exercise per Week: 0 days  . Minutes of Exercise per Session: 0 min  Stress: No Stress Concern Present  . Feeling of Stress : Not at all  Social Connections:   . Frequency of Communication with Friends and Family:   . Frequency of Social Gatherings with Friends and Family:   . Attends Religious Services:   . Active Member of Clubs or  Organizations:   . Attends Archivist Meetings:   Marland Kitchen Marital Status:     Tobacco Counseling Counseling given: Not Answered   Clinical Intake:  Pre-visit preparation completed: Yes  Pain : No/denies pain     Nutritional Risks: None Diabetes: No  How often do you need to have someone help you when you read instructions, pamphlets, or other written materials from your doctor or pharmacy?: 1 - Never What is the last grade level you completed in school?: 12th  Interpreter Needed?: No  Information entered by :: CJohnson, LPN   Activities of Daily Living In your present state of health, do you have any difficulty performing the following activities: 11/05/2019 07/01/2019  Hearing? N N  Vision? N N  Difficulty concentrating or making decisions? N N  Walking or climbing stairs? N -  Dressing or bathing? N N  Doing errands, shopping? N N  Preparing Food and eating ? N -  Using the Toilet? N -  In the past six months, have you accidently leaked urine? Y -  Comment wears depends -  Do you have problems with loss of bowel control? N -  Managing your Medications? N -  Managing your Finances? N -  Housekeeping or managing your Housekeeping? N -  Some recent data might be hidden     Immunizations and Health Maintenance Immunization History  Administered Date(s) Administered  . Influenza Split 04/02/2012  . Influenza, High Dose Seasonal PF 04/16/2017  . Influenza,inj,Quad PF,6+ Mos 04/29/2013, 04/18/2018, 04/29/2019  . Influenza-Unspecified 04/30/2016  . Pneumococcal Conjugate-13 04/29/2019  . Pneumococcal Polysaccharide-23 04/18/2018   Health Maintenance Due  Topic Date Due  . COVID-19 Vaccine (1) Never done    Patient Care Team: [name], NP as PCP - General (Internal Medicine) Fay Records, MD as PCP - Cardiology (Cardiology)  Indicate any recent Medical Services you may have received from other than [facility] providers in the past year (date may be  approximate).     Assessment:   This is a routine wellness examination for Kathleen Paul.  Hearing/Vision screen  Hearing Screening   125Hz  250Hz  500Hz  1000Hz  2000Hz  3000Hz  4000Hz  6000Hz  8000Hz   Right ear:  Left ear:           Vision Screening Comments: Patient gets annual eye exams   Dietary issues and exercise activities discussed: Current Exercise Habits: Home exercise routine, Type of exercise: stretching, Time (Minutes): 10, Frequency (Times/Week): 7, Weekly Exercise (Minutes/Week): 70, Intensity: Mild, Exercise limited by: None identified  Goals    . Patient Stated     11/05/2019, I will maintain and continue medications as prescribed.       Depression Screen PHQ 2/9 Scores 11/05/2019  PHQ - 2 Score 0  PHQ- 9 Score 0    Fall Risk Fall Risk  11/05/2019 02/05/2019  Falls in the past year? 0 0  Comment - Emmi Telephone Survey: data to providers prior to load  Number falls in past yr: 0 -  Injury with Fall? 0 -  Risk for fall due to : Impaired mobility;Impaired balance/gait -  Risk for fall due to: Comment knee surgery -  Follow up Falls evaluation completed;Falls prevention discussed -    Is the patient's home free of loose throw rugs in walkways, pet beds, electrical cords, etc?   yes      Grab bars in the bathroom? yes      Handrails on the stairs?   yes      Adequate lighting?   yes  Timed Get Up and Go Performed: N/A  Cognitive Function: MMSE - Mini Mental State Exam 11/05/2019  Orientation to time 5  Orientation to Place 5  Registration 3  Attention/ Calculation 5  Recall 3  Language- repeat 1       Mini Cog  Mini-Cog screen was completed. Maximum score is 22. A value of 0 denotes this part of the MMSE was not completed or the patient failed this part of the Mini-Cog screening.  Screening Tests Health Maintenance  Topic Date Due  . COVID-19 Vaccine (1) Never done  . DEXA SCAN  11/05/2023 (Originally 11/13/2002)  . TETANUS/TDAP  11/05/2023 (Originally  11/12/1954)  . INFLUENZA VACCINE  02/08/2020  . PNA vac Low Risk Adult  Completed    Qualifies for Shingles Vaccine: Yes  Cancer Screenings: Lung: Low Dose CT Chest recommended if Age 11-80 years, 30 pack-year currently smoking OR have quit w/in 15 years. Patient does not qualify. Breast: Up to date on Mammogram: no longer required   Up to date of Bone Density/Dexa: declined Colorectal: no longer required  Additional Screenings:  Hepatitis C Screening: N/A     Plan:   Patient will maintain and continue medications as prescribed.   I have personally reviewed and noted the following in the patient's chart:   . Medical and social history . Use of alcohol, tobacco or illicit drugs  . Current medications and supplements . Functional ability and status . Nutritional status . Physical activity . Advanced directives . List of other physicians . Hospitalizations, surgeries, and ER visits in previous 12 months . Vitals . Screenings to include cognitive, depression, and falls . Referrals and appointments  In addition, I have reviewed and discussed with patient certain preventive protocols, quality metrics, and best practice recommendations. A written personalized care plan for preventive services as well as general preventive health recommendations were provided to patient.     Andrez Grime, LPN   [phone]

## 2019-11-05 NOTE — Patient Instructions (Signed)
Kathleen Paul , Thank you for taking time to come for your Medicare Wellness Visit. I appreciate your ongoing commitment to your health goals. Please review the following plan we discussed and let me know if I can assist you in the future.   Screening recommendations/referrals: Colonoscopy: no longer required Mammogram: no longer required Bone Density: declined Recommended yearly ophthalmology/optometry visit for glaucoma screening and checkup Recommended yearly dental visit for hygiene and checkup  Vaccinations: Influenza vaccine: Up to date, completed 04/29/2019 Pneumococcal vaccine: Completed series Tdap vaccine: decline Shingles vaccine: discussed    Advanced directives: copy in chart  Conditions/risks identified: hypertension, hyperlipidemia  Next appointment: 11/19/2019 @ 9:20 am    Preventive Care 82 Years and Older, Female Preventive care refers to lifestyle choices and visits with your health care provider that can promote health and wellness. What does preventive care include?  A yearly physical exam. This is also called an annual well check.  Dental exams once or twice a year.  Routine eye exams. Ask your health care provider how often you should have your eyes checked.  Personal lifestyle choices, including:  Daily care of your teeth and gums.  Regular physical activity.  Eating a healthy diet.  Avoiding tobacco and drug use.  Limiting alcohol use.  Practicing safe sex.  Taking low-dose aspirin every day.  Taking vitamin and mineral supplements as recommended by your health care provider. What happens during an annual well check? The services and screenings done by your health care provider during your annual well check will depend on your age, overall health, lifestyle risk factors, and family history of disease. Counseling  Your health care provider may ask you questions about your:  Alcohol use.  Tobacco use.  Drug use.  Emotional  well-being.  Home and relationship well-being.  Sexual activity.  Eating habits.  History of falls.  Memory and ability to understand (cognition).  Work and work Statistician.  Reproductive health. Screening  You may have the following tests or measurements:  Height, weight, and BMI.  Blood pressure.  Lipid and cholesterol levels. These may be checked every 5 years, or more frequently if you are over 25 years old.  Skin check.  Lung cancer screening. You may have this screening every year starting at age 11 if you have a 30-pack-year history of smoking and currently smoke or have quit within the past 15 years.  Fecal occult blood test (FOBT) of the stool. You may have this test every year starting at age 45.  Flexible sigmoidoscopy or colonoscopy. You may have a sigmoidoscopy every 5 years or a colonoscopy every 10 years starting at age 3.  Hepatitis C blood test.  Hepatitis B blood test.  Sexually transmitted disease (STD) testing.  Diabetes screening. This is done by checking your blood sugar (glucose) after you have not eaten for a while (fasting). You may have this done every 1-3 years.  Bone density scan. This is done to screen for osteoporosis. You may have this done starting at age 92.  Mammogram. This may be done every 1-2 years. Talk to your health care provider about how often you should have regular mammograms. Talk with your health care provider about your test results, treatment options, and if necessary, the need for more tests. Vaccines  Your health care provider may recommend certain vaccines, such as:  Influenza vaccine. This is recommended every year.  Tetanus, diphtheria, and acellular pertussis (Tdap, Td) vaccine. You may need a Td booster every 10 years.  Zoster vaccine. You may need this after age 61.  Pneumococcal 13-valent conjugate (PCV13) vaccine. One dose is recommended after age 54.  Pneumococcal polysaccharide (PPSV23) vaccine. One  dose is recommended after age 41. Talk to your health care provider about which screenings and vaccines you need and how often you need them. This information is not intended to replace advice given to you by your health care provider. Make sure you discuss any questions you have with your health care provider. Document Released: 07/23/2015 Document Revised: 03/15/2016 Document Reviewed: 04/27/2015 Elsevier Interactive Patient Education  2017 [place] Prevention in the Home Falls can cause injuries. They can happen to people of all ages. There are many things you can do to make your home safe and to help prevent falls. What can I do on the outside of my home?  Regularly fix the edges of walkways and driveways and fix any cracks.  Remove anything that might make you trip as you walk through a door, such as a raised step or threshold.  Trim any bushes or trees on the path to your home.  Use bright outdoor lighting.  Clear any walking paths of anything that might make someone trip, such as rocks or tools.  Regularly check to see if handrails are loose or broken. Make sure that both sides of any steps have handrails.  Any raised decks and porches should have guardrails on the edges.  Have any leaves, snow, or ice cleared regularly.  Use sand or salt on walking paths during winter.  Clean up any spills in your garage right away. This includes oil or grease spills. What can I do in the bathroom?  Use night lights.  Install grab bars by the toilet and in the tub and shower. Do not use towel bars as grab bars.  Use non-skid mats or decals in the tub or shower.  If you need to sit down in the shower, use a plastic, non-slip stool.  Keep the floor dry. Clean up any water that spills on the floor as soon as it happens.  Remove soap buildup in the tub or shower regularly.  Attach bath mats securely with double-sided non-slip rug tape.  Do not have throw rugs and other  things on the floor that can make you trip. What can I do in the bedroom?  Use night lights.  Make sure that you have a light by your bed that is easy to reach.  Do not use any sheets or blankets that are too big for your bed. They should not hang down onto the floor.  Have a firm chair that has side arms. You can use this for support while you get dressed.  Do not have throw rugs and other things on the floor that can make you trip. What can I do in the kitchen?  Clean up any spills right away.  Avoid walking on wet floors.  Keep items that you use a lot in easy-to-reach places.  If you need to reach something above you, use a strong step stool that has a grab bar.  Keep electrical cords out of the way.  Do not use floor polish or wax that makes floors slippery. If you must use wax, use non-skid floor wax.  Do not have throw rugs and other things on the floor that can make you trip. What can I do with my stairs?  Do not leave any items on the stairs.  Make sure that there are  handrails on both sides of the stairs and use them. Fix handrails that are broken or loose. Make sure that handrails are as long as the stairways.  Check any carpeting to make sure that it is firmly attached to the stairs. Fix any carpet that is loose or worn.  Avoid having throw rugs at the top or bottom of the stairs. If you do have throw rugs, attach them to the floor with carpet tape.  Make sure that you have a light switch at the top of the stairs and the bottom of the stairs. If you do not have them, ask someone to add them for you. What else can I do to help prevent falls?  Wear shoes that:  Do not have high heels.  Have rubber bottoms.  Are comfortable and fit you well.  Are closed at the toe. Do not wear sandals.  If you use a stepladder:  Make sure that it is fully opened. Do not climb a closed stepladder.  Make sure that both sides of the stepladder are locked into place.  Ask  someone to hold it for you, if possible.  Clearly mark and make sure that you can see:  Any grab bars or handrails.  First and last steps.  Where the edge of each step is.  Use tools that help you move around (mobility aids) if they are needed. These include:  Canes.  Walkers.  Scooters.  Crutches.  Turn on the lights when you go into a dark area. Replace any light bulbs as soon as they burn out.  Set up your furniture so you have a clear path. Avoid moving your furniture around.  If any of your floors are uneven, fix them.  If there are any pets around you, be aware of where they are.  Review your medicines with your doctor. Some medicines can make you feel dizzy. This can increase your chance of falling. Ask your doctor what other things that you can do to help prevent falls. This information is not intended to replace advice given to you by your health care provider. Make sure you discuss any questions you have with your health care provider. Document Released: 04/22/2009 Document Revised: 12/02/2015 Document Reviewed: 07/31/2014 Elsevier Interactive Patient Education  2017 [employer].

## 2019-11-12 ENCOUNTER — Other Ambulatory Visit: Payer: PPO

## 2019-11-19 ENCOUNTER — Other Ambulatory Visit: Payer: Self-pay

## 2019-11-19 ENCOUNTER — Ambulatory Visit (INDEPENDENT_AMBULATORY_CARE_PROVIDER_SITE_OTHER): Payer: PPO | Admitting: Primary Care

## 2019-11-19 ENCOUNTER — Encounter: Payer: Self-pay | Admitting: Primary Care

## 2019-11-19 VITALS — BP 120/72 | HR 72 | Temp 96.0°F | Ht 60.0 in | Wt 146.8 lb

## 2019-11-19 DIAGNOSIS — E039 Hypothyroidism, unspecified: Secondary | ICD-10-CM | POA: Diagnosis not present

## 2019-11-19 DIAGNOSIS — I10 Essential (primary) hypertension: Secondary | ICD-10-CM | POA: Diagnosis not present

## 2019-11-19 DIAGNOSIS — D649 Anemia, unspecified: Secondary | ICD-10-CM

## 2019-11-19 DIAGNOSIS — H2513 Age-related nuclear cataract, bilateral: Secondary | ICD-10-CM | POA: Diagnosis not present

## 2019-11-19 DIAGNOSIS — F411 Generalized anxiety disorder: Secondary | ICD-10-CM | POA: Diagnosis not present

## 2019-11-19 DIAGNOSIS — S86812D Strain of other muscle(s) and tendon(s) at lower leg level, left leg, subsequent encounter: Secondary | ICD-10-CM

## 2019-11-19 DIAGNOSIS — N289 Disorder of kidney and ureter, unspecified: Secondary | ICD-10-CM | POA: Diagnosis not present

## 2019-11-19 DIAGNOSIS — E785 Hyperlipidemia, unspecified: Secondary | ICD-10-CM

## 2019-11-19 DIAGNOSIS — M170 Bilateral primary osteoarthritis of knee: Secondary | ICD-10-CM | POA: Diagnosis not present

## 2019-11-19 DIAGNOSIS — I2510 Atherosclerotic heart disease of native coronary artery without angina pectoris: Secondary | ICD-10-CM | POA: Diagnosis not present

## 2019-11-19 DIAGNOSIS — Z0000 Encounter for general adult medical examination without abnormal findings: Secondary | ICD-10-CM | POA: Diagnosis not present

## 2019-11-19 DIAGNOSIS — K2100 Gastro-esophageal reflux disease with esophagitis, without bleeding: Secondary | ICD-10-CM | POA: Diagnosis not present

## 2019-11-19 DIAGNOSIS — K222 Esophageal obstruction: Secondary | ICD-10-CM | POA: Diagnosis not present

## 2019-11-19 LAB — CBC
HCT: 32.1 % — ABNORMAL LOW (ref 36.0–46.0)
Hemoglobin: 10.8 g/dL — ABNORMAL LOW (ref 12.0–15.0)
MCHC: 33.7 g/dL (ref 30.0–36.0)
MCV: 94.2 fl (ref 78.0–100.0)
Platelets: 318 10*3/uL (ref 150.0–400.0)
RBC: 3.41 Mil/uL — ABNORMAL LOW (ref 3.87–5.11)
RDW: 16.2 % — ABNORMAL HIGH (ref 11.5–15.5)
WBC: 5.6 10*3/uL (ref 4.0–10.5)

## 2019-11-19 LAB — VITAMIN B12: Vitamin B-12: 1157 pg/mL — ABNORMAL HIGH (ref 211–911)

## 2019-11-19 MED ORDER — OMEPRAZOLE 40 MG PO CPDR: 40.0000 mg | DELAYED_RELEASE_CAPSULE | Freq: Every day | ORAL | 0 refills | Status: DC

## 2019-11-19 MED ORDER — TRAMADOL HCL 50 MG PO TABS: 50.0000 mg | ORAL_TABLET | Freq: Two times a day (BID) | ORAL | 0 refills | Status: DC | PRN

## 2019-11-19 NOTE — Assessment & Plan Note (Signed)
Doing well on omeprazole 40 mg, refills provided.

## 2019-11-19 NOTE — Assessment & Plan Note (Signed)
Shingrix due, she will get this at the pharmacy. Other vaccines UTD. Declines mammogram due to age. Declines bone density scan due to inability to ambulate well. Colonoscopy not needed given age.  Encouraged a healthy diet and regular exercise. Exam today stable. Labs reviewed and pending.

## 2019-11-19 NOTE — Progress Notes (Signed)
Subjective:    Patient ID: Kathleen Paul, female    DOB: 1936/02/21, 84 y.o.   MRN: [id number]  HPI  This visit occurred during the SARS-CoV-2 public health emergency.  Safety protocols were in place, including screening questions prior to the visit, additional usage of staff PPE, and extensive cleaning of exam room while observing appropriate contact time as indicated for disinfecting solutions.   Kathleen Paul is a 84 year old female who presents today for complete physical.  Immunizations: -Influenza: Completed last season  -Shingles: Never completed -Pneumonia: Completed Prevnar in 2020, Pneumovax in 2019 -Covid-19: Completed series  Diet: She endorses a fair diet.  Exercise: She is not exercising.   Eye exam: Due today Dental exam: Completes annually   Mammogram: No recent mammogram, declines  Dexa: No recent scan, declines  Colonoscopy: Completed years ago, no need for further imaging.   BP Readings from Last 3 Encounters:  11/19/19 120/72  11/05/19 110/78  08/19/19 120/70      Review of Systems  Constitutional: Negative for unexpected weight change.  HENT: Negative for rhinorrhea.   Respiratory: Negative for cough and shortness of breath.   Cardiovascular: Negative for chest pain.  Gastrointestinal: Negative for constipation and diarrhea.  Genitourinary: Negative for difficulty urinating.  Musculoskeletal: Positive for arthralgias.  Skin: Negative for rash.  Allergic/Immunologic: Negative for environmental allergies.  Neurological: Negative for dizziness, numbness and headaches.  Psychiatric/Behavioral:       Increased stress with her knee surgeries        Past Medical History:  Diagnosis Date  . Anxiety state, unspecified   . Arthritis   . Atherosclerosis   . Cancer ([place])    skin cancer  . Diverticulosis   . Esophageal stricture   . Essential hypertension, benign   . Gastric ulcer   . Hiatal hernia   . History of DVT (deep vein thrombosis) yrs  ago   legs  . IDA (iron deficiency anemia)   . Inguinal hernia without mention of obstruction or gangrene, unilateral or unspecified, (not specified as recurrent)    2014  . Internal hemorrhoids   . Mild mitral regurgitation    a. 04/2010 Echo: nl EF, mild diast dysfxn, trace PR, mild TR, mild-mod MR.  . Non-obstructive CAD    a. 04/2010 Myoview: EF 77%, fixed apical/inferior defect;  b. 04/2010 Cath: LAD 81m, LCX/RCA minor irregs, EF 60%;  c. 04/2011 Myoview: low risk.  . Pure hypercholesterolemia   . Rupture of left patellar tendon    05-26-2019  . Scoliosis   . UI (urinary incontinence)   . Umbilical hernia without mention of obstruction or gangrene    2014  . Unspecified hypothyroidism   . Vitamin D deficiency      Social History   Socioeconomic History  . Marital status: Widowed    Spouse name: Not on file  . Number of children: Not on file  . Years of education: Not on file  . Highest education level: Not on file  Occupational History  . Not on file  Tobacco Use  . Smoking status: Former Smoker    Packs/day: 0.50    Years: 30.00    Pack years: 15.00    Types: Cigarettes    Quit date: 07/10/1988    Years since quitting: 31.3  . Smokeless tobacco: Never Used  Substance and Sexual Activity  . Alcohol use: No  . Drug use: Never  . Sexual activity: Not on file  Other Topics Concern  .  Not on file  Social History Narrative  . Not on file   Social Determinants of Health   Financial Resource Strain: Low Risk   . Difficulty of Paying Living Expenses: Not hard at all  Food Insecurity: No Food Insecurity  . Worried About Charity fundraiser in the Last Year: Never true  . Ran Out of Food in the Last Year: Never true  Transportation Needs: No Transportation Needs  . Lack of Transportation (Medical): No  . Lack of Transportation (Non-Medical): No  Physical Activity: Inactive  . Days of Exercise per Week: 0 days  . Minutes of Exercise per Session: 0 min  Stress: No  Stress Concern Present  . Feeling of Stress : Not at all  Social Connections:   . Frequency of Communication with Friends and Family:   . Frequency of Social Gatherings with Friends and Family:   . Attends Religious Services:   . Active Member of Clubs or Organizations:   . Attends Archivist Meetings:   Marland Kitchen Marital Status:   Intimate Partner Violence: Not At Risk  . Fear of Current or Ex-Partner: No  . Emotionally Abused: No  . Physically Abused: No  . Sexually Abused: No    Past Surgical History:  Procedure Laterality Date  . ABDOMINAL HYSTERECTOMY  2008   complete   . BIOPSY  04/02/2018   Procedure: BIOPSY;  Surgeon: Rush Landmark Telford Nab., MD;  Location: [facility];  Service: Gastroenterology;;  . CARDIAC CATHETERIZATION  2011   [facility]  . COLONOSCOPY  2003   [facility]  . ESOPHAGOGASTRODUODENOSCOPY (EGD) WITH PROPOFOL N/A 04/02/2018   Procedure: ESOPHAGOGASTRODUODENOSCOPY (EGD) WITH PROPOFOL;  Surgeon: Rush Landmark Telford Nab., MD;  Location: [facility];  Service: Gastroenterology;  Laterality: N/A;  . FOREIGN BODY REMOVAL  04/02/2018   Procedure: FOREIGN BODY REMOVAL;  Surgeon: Rush Landmark Telford Nab., MD;  Location: [facility];  Service: Gastroenterology;;  . HERNIA REPAIR Right 09/23/2012   repair R[facility]  . LAPAROSCOPIC HYSTERECTOMY    . PATELLAR TENDON REPAIR Left 06/03/2019   Procedure: LEFT PATELLA TENDON REPAIR, RIGHT KNEE INJECTION.;  Surgeon: Paralee Cancel, MD;  Location: [facility];  Service: Orthopedics;  Laterality: Left;  . PATELLAR TENDON REPAIR Left 07/01/2019   Procedure: OPEN PATELLA TENDON REPAIR;  Surgeon: Paralee Cancel, MD;  Location: [facility];  Service: Orthopedics;  Laterality: Left;  . TONSILLECTOMY    . TOTAL KNEE ARTHROPLASTY Left 02/11/2019   Procedure: TOTAL KNEE ARTHROPLASTY;  Surgeon: Paralee Cancel, MD;  Location: [facility];  Service: Orthopedics;  Laterality: Left;  70 mins    Family History  Problem Relation Age of Onset  . Heart  disease Mother   . Heart attack Mother   . Heart disease Father   . Heart attack Father   . Breast cancer Sister   . Breast cancer Maternal Aunt   . Heart disease Brother        sister    Allergies  Allergen Reactions  . Codeine Nausea Only    Current Outpatient Medications on File Prior to Visit  Medication Sig Dispense Refill  . acetaminophen (TYLENOL) 500 MG tablet Take 2 tablets (1,000 mg total) by mouth every 8 (eight) hours. 30 tablet 0  . Calcium-Magnesium-Vitamin D (CALCIUM 1200+D3 PO) Take 1 tablet by mouth daily.    . enalapril-hydrochlorothiazide (VASERETIC) 10-25 MG tablet TAKE 1 TABLET BY MOUTH DAILY 90 tablet 1  . levothyroxine (SYNTHROID) 88 MCG tablet Take 1 tablet by mouth every morning on an empty  stomach with water only.  No food or other medications for 30 minutes. 90 tablet 1  . metoprolol tartrate (LOPRESSOR) 25 MG tablet TAKE 1 TABLET BY MOUTH EVERY EVENING 90 tablet 3  . Multiple Vitamin (MULTIVITAMIN PO) Take 1 tablet by mouth daily.    Marland Kitchen OVER THE COUNTER MEDICATION Apply 1 application topically 2 (two) times daily as needed (for pain). OxyRub Cream     . simvastatin (ZOCOR) 20 MG tablet Take 1 tablet (20 mg total) by mouth every evening. For cholesterol. 90 tablet 1   No current facility-administered medications on file prior to visit.    BP 120/72   Pulse 72   Temp (!) 96 F (35.6 C) (Temporal)   Ht 5' (1.524 m)   Wt 146 lb 12 oz (66.6 kg)   SpO2 98%   BMI 28.66 kg/m    Objective:   Physical Exam  Constitutional: She is oriented to person, place, and time. She appears well-nourished.  HENT:  Right Ear: Tympanic membrane and ear canal normal.  Left Ear: Tympanic membrane and ear canal normal.  Mouth/Throat: Oropharynx is clear and moist.  Eyes: Pupils are equal, round, and reactive to light. EOM are normal.  Cardiovascular: Normal rate and regular rhythm.  Respiratory: Effort normal and breath sounds normal.  GI: Soft. Bowel sounds are  normal. There is no abdominal tenderness.  Musculoskeletal:     Cervical back: Neck supple.     Comments: Decrease in ROM to left knee, chronic. Using walker.  Neurological: She is alert and oriented to person, place, and time. No cranial nerve deficit.  Reflex Scores:      Patellar reflexes are 2+ on the right side and 2+ on the left side. Skin: Skin is warm and dry.  Psychiatric: She has a normal mood and affect.           Assessment & Plan:

## 2019-11-19 NOTE — Assessment & Plan Note (Signed)
Asymptomatic, continue lipid and BP control.

## 2019-11-19 NOTE — Assessment & Plan Note (Signed)
Temporary refill provided for Tramadol, she will use sparingly.

## 2019-11-19 NOTE — Assessment & Plan Note (Signed)
Renal function improved since coming off of NSAID treatment. Following with nephrology.

## 2019-11-19 NOTE — Assessment & Plan Note (Signed)
Stable in the office today, continue enalapril-HCTZ, metoprolol tartrate. CMP from February 2021 reviewed.

## 2019-11-19 NOTE — Assessment & Plan Note (Signed)
Overall stable despite numerous left knee surgeries. Continue to monitor.

## 2019-11-19 NOTE — Patient Instructions (Signed)
Stop by the lab prior to leaving today. I will notify you of your results once received.   Use the Tramadol sparingly as needed for pain.  Call Dr. Vena Rua office for a virtual visit.  Work on a healthy diet. Ensure you are consuming 64 ounces of water daily.  It was a pleasure to see you today!   Preventive Care 82 Years and Older, Female Preventive care refers to lifestyle choices and visits with your health care provider that can promote health and wellness. This includes:  A yearly physical exam. This is also called an annual well check.  Regular dental and eye exams.  Immunizations.  Screening for certain conditions.  Healthy lifestyle choices, such as diet and exercise. What can I expect for my preventive care visit? Physical exam Your health care provider will check:  Height and weight. These may be used to calculate body mass index (BMI), which is a measurement that tells if you are at a healthy weight.  Heart rate and blood pressure.  Your skin for abnormal spots. Counseling Your health care provider may ask you questions about:  Alcohol, tobacco, and drug use.  Emotional well-being.  Home and relationship well-being.  Sexual activity.  Eating habits.  History of falls.  Memory and ability to understand (cognition).  Work and work Statistician.  Pregnancy and menstrual history. What immunizations do I need?  Influenza (flu) vaccine  This is recommended every year. Tetanus, diphtheria, and pertussis (Tdap) vaccine  You may need a Td booster every 10 years. Varicella (chickenpox) vaccine  You may need this vaccine if you have not already been vaccinated. Zoster (shingles) vaccine  You may need this after age 54. Pneumococcal conjugate (PCV13) vaccine  One dose is recommended after age 27. Pneumococcal polysaccharide (PPSV23) vaccine  One dose is recommended after age 49. Measles, mumps, and rubella (MMR) vaccine  You may need at least  one dose of MMR if you were born in 1957 or later. You may also need a second dose. Meningococcal conjugate (MenACWY) vaccine  You may need this if you have certain conditions. Hepatitis A vaccine  You may need this if you have certain conditions or if you travel or work in places where you may be exposed to hepatitis A. Hepatitis B vaccine  You may need this if you have certain conditions or if you travel or work in places where you may be exposed to hepatitis B. Haemophilus influenzae type b (Hib) vaccine  You may need this if you have certain conditions. You may receive vaccines as individual doses or as more than one vaccine together in one shot (combination vaccines). Talk with your health care provider about the risks and benefits of combination vaccines. What tests do I need? Blood tests  Lipid and cholesterol levels. These may be checked every 5 years, or more frequently depending on your overall health.  Hepatitis C test.  Hepatitis B test. Screening  Lung cancer screening. You may have this screening every year starting at age 79 if you have a 30-pack-year history of smoking and currently smoke or have quit within the past 15 years.  Colorectal cancer screening. All adults should have this screening starting at age 9 and continuing until age 54. Your health care provider may recommend screening at age 73 if you are at increased risk. You will have tests every 1-10 years, depending on your results and the type of screening test.  Diabetes screening. This is done by checking your blood sugar (  glucose) after you have not eaten for a while (fasting). You may have this done every 1-3 years.  Mammogram. This may be done every 1-2 years. Talk with your health care provider about how often you should have regular mammograms.  BRCA-related cancer screening. This may be done if you have a family history of breast, ovarian, tubal, or peritoneal cancers. Other tests  Sexually  transmitted disease (STD) testing.  Bone density scan. This is done to screen for osteoporosis. You may have this done starting at age 42. Follow these instructions at home: Eating and drinking  Eat a diet that includes fresh fruits and vegetables, whole grains, lean protein, and low-fat dairy products. Limit your intake of foods with high amounts of sugar, saturated fats, and salt.  Take vitamin and mineral supplements as recommended by your health care provider.  Do not drink alcohol if your health care provider tells you not to drink.  If you drink alcohol: ? Limit how much you have to 0-1 drink a day. ? Be aware of how much alcohol is in your drink. In the [place] one drink equals one 12 oz bottle of beer (355 mL), one 5 oz glass of wine (148 mL), or one 1 oz glass of hard liquor (44 mL). Lifestyle  Take daily care of your teeth and gums.  Stay active. Exercise for at least 30 minutes on 5 or more days each week.  Do not use any products that contain nicotine or tobacco, such as cigarettes, e-cigarettes, and chewing tobacco. If you need help quitting, ask your health care provider.  If you are sexually active, practice safe sex. Use a condom or other form of protection in order to prevent STIs (sexually transmitted infections).  Talk with your health care provider about taking a low-dose aspirin or statin. What's next?  Go to your health care provider once a year for a well check visit.  Ask your health care provider how often you should have your eyes and teeth checked.  Stay up to date on all vaccines. This information is not intended to replace advice given to you by your health care provider. Make sure you discuss any questions you have with your health care provider. Document Revised: 06/20/2018 Document Reviewed: 06/20/2018 Elsevier Patient Education  2020 [employer].

## 2019-11-19 NOTE — Assessment & Plan Note (Signed)
She is taking levothyroxine correctly. TSH from April 2021 reviewed and stable.

## 2019-11-19 NOTE — Assessment & Plan Note (Signed)
Doing well on PPI therapy, refill provided.

## 2019-11-19 NOTE — Assessment & Plan Note (Signed)
Lipid panel from February 2021 stable, continue simvastatin.

## 2019-11-19 NOTE — Assessment & Plan Note (Signed)
Following with orthopedics and has now undergone three surgeries. Refill of Tramadol provided for which she takes sparingly. Next appointment with orthopedics is in 6 weeks.

## 2019-11-30 DIAGNOSIS — S76192D Other specified injury of left quadriceps muscle, fascia and tendon, subsequent encounter: Secondary | ICD-10-CM | POA: Diagnosis not present

## 2019-11-30 DIAGNOSIS — M1712 Unilateral primary osteoarthritis, left knee: Secondary | ICD-10-CM | POA: Diagnosis not present

## 2019-12-17 ENCOUNTER — Other Ambulatory Visit: Payer: PPO

## 2019-12-26 ENCOUNTER — Other Ambulatory Visit: Payer: Self-pay | Admitting: Primary Care

## 2019-12-26 DIAGNOSIS — D649 Anemia, unspecified: Secondary | ICD-10-CM

## 2019-12-30 ENCOUNTER — Other Ambulatory Visit (INDEPENDENT_AMBULATORY_CARE_PROVIDER_SITE_OTHER): Payer: PPO

## 2019-12-30 DIAGNOSIS — D649 Anemia, unspecified: Secondary | ICD-10-CM

## 2019-12-30 DIAGNOSIS — H2512 Age-related nuclear cataract, left eye: Secondary | ICD-10-CM | POA: Diagnosis not present

## 2019-12-30 DIAGNOSIS — H25013 Cortical age-related cataract, bilateral: Secondary | ICD-10-CM | POA: Diagnosis not present

## 2019-12-30 DIAGNOSIS — H2513 Age-related nuclear cataract, bilateral: Secondary | ICD-10-CM | POA: Diagnosis not present

## 2019-12-30 DIAGNOSIS — I25810 Atherosclerosis of coronary artery bypass graft(s) without angina pectoris: Secondary | ICD-10-CM

## 2019-12-30 DIAGNOSIS — H18413 Arcus senilis, bilateral: Secondary | ICD-10-CM | POA: Diagnosis not present

## 2019-12-30 DIAGNOSIS — H25043 Posterior subcapsular polar age-related cataract, bilateral: Secondary | ICD-10-CM | POA: Diagnosis not present

## 2019-12-30 LAB — CBC
HCT: 29.1 % — ABNORMAL LOW (ref 36.0–46.0)
Hemoglobin: 9.8 g/dL — ABNORMAL LOW (ref 12.0–15.0)
MCHC: 33.9 g/dL (ref 30.0–36.0)
MCV: 94.9 fl (ref 78.0–100.0)
Platelets: 227 10*3/uL (ref 150.0–400.0)
RBC: 3.06 Mil/uL — ABNORMAL LOW (ref 3.87–5.11)
RDW: 15.4 % (ref 11.5–15.5)
WBC: 4.3 10*3/uL (ref 4.0–10.5)

## 2019-12-30 LAB — RENAL FUNCTION PANEL
Albumin: 3.9 g/dL (ref 3.5–5.2)
BUN: 52 mg/dL — ABNORMAL HIGH (ref 6–23)
CO2: 27 mEq/L (ref 19–32)
Calcium: 8.7 mg/dL (ref 8.4–10.5)
Chloride: 100 mEq/L (ref 96–112)
Creatinine, Ser: 1.47 mg/dL — ABNORMAL HIGH (ref 0.40–1.20)
GFR: 34.02 mL/min — ABNORMAL LOW (ref >60.00)
Glucose, Bld: 76 mg/dL (ref 70–99)
Phosphorus: 4.2 mg/dL (ref 2.3–4.6)
Potassium: 4.8 mEq/L (ref 3.5–5.1)
Sodium: 136 mEq/L (ref 135–145)

## 2019-12-30 LAB — VITAMIN B12: Vitamin B-12: 1241 pg/mL — ABNORMAL HIGH (ref 211–911)

## 2019-12-31 DIAGNOSIS — S76192D Other specified injury of left quadriceps muscle, fascia and tendon, subsequent encounter: Secondary | ICD-10-CM | POA: Diagnosis not present

## 2019-12-31 DIAGNOSIS — M1712 Unilateral primary osteoarthritis, left knee: Secondary | ICD-10-CM | POA: Diagnosis not present

## 2020-01-05 ENCOUNTER — Other Ambulatory Visit: Payer: PPO

## 2020-01-06 ENCOUNTER — Other Ambulatory Visit: Payer: Self-pay

## 2020-01-06 DIAGNOSIS — M170 Bilateral primary osteoarthritis of knee: Secondary | ICD-10-CM

## 2020-01-07 ENCOUNTER — Other Ambulatory Visit: Payer: Self-pay

## 2020-01-07 ENCOUNTER — Telehealth: Payer: Self-pay | Admitting: Internal Medicine

## 2020-01-07 DIAGNOSIS — K2100 Gastro-esophageal reflux disease with esophagitis, without bleeding: Secondary | ICD-10-CM

## 2020-01-07 MED ORDER — TRAMADOL HCL 50 MG PO TABS: 50.0000 mg | ORAL_TABLET | Freq: Two times a day (BID) | ORAL | 0 refills | Status: DC | PRN

## 2020-01-07 MED ORDER — OMEPRAZOLE 40 MG PO CPDR: 40.0000 mg | DELAYED_RELEASE_CAPSULE | Freq: Every day | ORAL | 1 refills | Status: DC

## 2020-01-07 NOTE — Telephone Encounter (Signed)
I spoke with patient via phone, we agreed that Tramadol should last two months and to call if anything changed. Refill sent to pharmacy.

## 2020-01-07 NOTE — Telephone Encounter (Signed)
Spoken and notified patient of Kathleen Paul comments. Patient stated that she is not taking this every day. Using is as needed and it was not refill since May. Patient stated that she is in a lot of pain and it help subsided the pain. The orthopedist told patient to ask primary provider for refills.

## 2020-01-07 NOTE — Telephone Encounter (Signed)
Patient called states she spoke with her PCP and they told her Dr. Hilarie Fredrickson requested for her to call him for a tele-visit. Nothing on file please advise

## 2020-01-07 NOTE — Telephone Encounter (Signed)
Pts pcp refilled her script 1 time and told her she would need to follow up with Dr. Norman Herrlich for telephone visit for more refills. Pt has had 3 knee surgeries and is in a brace and unable to come in for visit. 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 She is not having any issues and just needs her omeprazole refilled. Please advise.

## 2020-01-07 NOTE — Telephone Encounter (Signed)
Ok to refill PPI

## 2020-01-07 NOTE — Telephone Encounter (Signed)
Refill for Omeprazole sent to patient's pharmacy. Spoke with patient, patient aware.

## 2020-01-07 NOTE — Telephone Encounter (Signed)
Last prescribed on 11/19/2019 Last OV (cpe) with [name] on  11/19/2019 No future OV scheduled

## 2020-01-07 NOTE — Telephone Encounter (Signed)
Please notify patient that I am willing to continue this on an as needed basis, the Rx needs to last longer, about 3 months. The orthopedist doesn't want to prescribe it as it's not the safest treatment option.   Let me know if she agrees and then I'll refill.

## 2020-01-07 NOTE — Telephone Encounter (Signed)
Please kindly notify patient that we cannot continue to refill Tramadol on a routine basis. What is the reason for her refill request? Has she asked her orthopedist?

## 2020-01-07 NOTE — Telephone Encounter (Signed)
Spoken and notified patient of Kathleen Paul comments. Patient stated that she do not think that she can make this Rx last for 3 months but she will try. She stated that she don't believe [name] understands the pain she is going through and that [name] is going back on her words to refill when she the patient needs it. However, will agree to the terms that is given.

## 2020-01-21 DIAGNOSIS — T8484XA Pain due to internal orthopedic prosthetic devices, implants and grafts, initial encounter: Secondary | ICD-10-CM | POA: Diagnosis not present

## 2020-01-21 DIAGNOSIS — M222X2 Patellofemoral disorders, left knee: Secondary | ICD-10-CM | POA: Diagnosis not present

## 2020-01-21 DIAGNOSIS — M25562 Pain in left knee: Secondary | ICD-10-CM | POA: Diagnosis not present

## 2020-01-21 DIAGNOSIS — G8929 Other chronic pain: Secondary | ICD-10-CM | POA: Diagnosis not present

## 2020-01-30 DIAGNOSIS — M1712 Unilateral primary osteoarthritis, left knee: Secondary | ICD-10-CM | POA: Diagnosis not present

## 2020-01-30 DIAGNOSIS — S76192D Other specified injury of left quadriceps muscle, fascia and tendon, subsequent encounter: Secondary | ICD-10-CM | POA: Diagnosis not present

## 2020-02-13 ENCOUNTER — Other Ambulatory Visit: Payer: Self-pay | Admitting: Primary Care

## 2020-02-13 DIAGNOSIS — E039 Hypothyroidism, unspecified: Secondary | ICD-10-CM

## 2020-02-13 DIAGNOSIS — E785 Hyperlipidemia, unspecified: Secondary | ICD-10-CM

## 2020-02-13 DIAGNOSIS — I10 Essential (primary) hypertension: Secondary | ICD-10-CM

## 2020-03-01 DIAGNOSIS — S76192D Other specified injury of left quadriceps muscle, fascia and tendon, subsequent encounter: Secondary | ICD-10-CM | POA: Diagnosis not present

## 2020-03-01 DIAGNOSIS — M1712 Unilateral primary osteoarthritis, left knee: Secondary | ICD-10-CM | POA: Diagnosis not present

## 2020-03-03 DIAGNOSIS — Z96659 Presence of unspecified artificial knee joint: Secondary | ICD-10-CM | POA: Diagnosis not present

## 2020-03-03 DIAGNOSIS — T84098S Other mechanical complication of other internal joint prosthesis, sequela: Secondary | ICD-10-CM | POA: Diagnosis not present

## 2020-03-03 DIAGNOSIS — M25562 Pain in left knee: Secondary | ICD-10-CM | POA: Diagnosis not present

## 2020-04-01 DIAGNOSIS — S76192D Other specified injury of left quadriceps muscle, fascia and tendon, subsequent encounter: Secondary | ICD-10-CM | POA: Diagnosis not present

## 2020-04-01 DIAGNOSIS — M1712 Unilateral primary osteoarthritis, left knee: Secondary | ICD-10-CM | POA: Diagnosis not present

## 2020-04-01 DIAGNOSIS — S86812A Strain of other muscle(s) and tendon(s) at lower leg level, left leg, initial encounter: Secondary | ICD-10-CM | POA: Diagnosis not present

## 2020-04-01 DIAGNOSIS — Z96652 Presence of left artificial knee joint: Secondary | ICD-10-CM | POA: Diagnosis not present

## 2020-04-19 ENCOUNTER — Other Ambulatory Visit: Payer: Self-pay

## 2020-04-19 ENCOUNTER — Encounter: Payer: Self-pay | Admitting: Family Medicine

## 2020-04-19 ENCOUNTER — Ambulatory Visit (INDEPENDENT_AMBULATORY_CARE_PROVIDER_SITE_OTHER): Payer: PPO | Admitting: Family Medicine

## 2020-04-19 VITALS — BP 126/70 | HR 52 | Temp 97.2°F | Wt 149.5 lb

## 2020-04-19 DIAGNOSIS — R300 Dysuria: Secondary | ICD-10-CM

## 2020-04-19 DIAGNOSIS — Z23 Encounter for immunization: Secondary | ICD-10-CM

## 2020-04-19 DIAGNOSIS — H6120 Impacted cerumen, unspecified ear: Secondary | ICD-10-CM

## 2020-04-19 LAB — POC URINALSYSI DIPSTICK (AUTOMATED)
Bilirubin, UA: NEGATIVE
Blood, UA: NEGATIVE
Glucose, UA: NEGATIVE
Ketones, UA: NEGATIVE
Leukocytes, UA: NEGATIVE
Nitrite, UA: NEGATIVE
Protein, UA: NEGATIVE
Spec Grav, UA: 1.02 (ref 1.010–1.025)
Urobilinogen, UA: 0.2 E.U./dL
pH, UA: 6 (ref 5.0–8.0)

## 2020-04-19 NOTE — Progress Notes (Signed)
   Subjective:     Kathleen Paul is a 84 y.o. female presenting for Dysuria (x 1 week) and Urinary Frequency (x 1 week )    HPI  #Dysuria - occurred 1-2 weeks ago with symptoms normal x 1 week - symptoms have resolved - frequency resolved - no fever/chills - no abdominal pain   #hearing concern - has been turning the TV up - wondering if ear wax build-up - has had is flushed in the office before  Review of Systems   Social History   Tobacco Use  Smoking Status Former Smoker  . Packs/day: 0.50  . Years: 30.00  . Pack years: 15.00  . Types: Cigarettes  . Quit date: 07/10/1988  . Years since quitting: 31.7  Smokeless Tobacco Never Used        Objective:    BP Readings from Last 3 Encounters:  04/19/20 126/70  11/19/19 120/72  11/05/19 110/78   Wt Readings from Last 3 Encounters:  04/19/20 149 lb 8 oz (67.8 kg)  11/19/19 146 lb 12 oz (66.6 kg)  11/05/19 140 lb (63.5 kg)    BP 126/70   Pulse (!) 52   Temp (!) 97.2 F (36.2 C) (Temporal)   Wt 149 lb 8 oz (67.8 kg)   SpO2 100%   BMI 29.20 kg/m    Physical Exam Constitutional:      General: She is not in acute distress.    Appearance: She is well-developed. She is not diaphoretic.  HENT:     Head:     Comments: Right with cerumen but no impaction.     Right Ear: External ear normal.     Left Ear: Tympanic membrane and external ear normal.  Eyes:     Conjunctiva/sclera: Conjunctivae normal.  Cardiovascular:     Rate and Rhythm: Normal rate and regular rhythm.  Pulmonary:     Effort: Pulmonary effort is normal. No respiratory distress.     Breath sounds: Normal breath sounds. No wheezing.  Musculoskeletal:     Cervical back: Neck supple.  Skin:    General: Skin is warm and dry.     Capillary Refill: Capillary refill takes less than 2 seconds.  Neurological:     Mental Status: She is alert. Mental status is at baseline.  Psychiatric:        Mood and Affect: Mood normal.        Behavior:  Behavior normal.     UA: no sign of infection      Assessment & Plan:   Problem List Items Addressed This Visit    None    Visit Diagnoses    Dysuria    -  Primary   Relevant Orders   POCT Urinalysis Dipstick (Automated) (Completed)   Need for influenza vaccination       Relevant Orders   Flu Vaccine QUAD 36+ mos IM (Completed)   Wax in ear         Urine w/o sign of infection and pts symptoms have resolved. Reassurance provided.   Ear wax - no impaction, advised OTC drops to soften and continued monitoring of hearing changes  Return if symptoms worsen or fail to improve.  Lesleigh Noe, MD  This visit occurred during the SARS-CoV-2 public health emergency.  Safety protocols were in place, including screening questions prior to the visit, additional usage of staff PPE, and extensive cleaning of exam room while observing appropriate contact time as indicated for disinfecting solutions.

## 2020-05-01 DIAGNOSIS — M1712 Unilateral primary osteoarthritis, left knee: Secondary | ICD-10-CM | POA: Diagnosis not present

## 2020-05-01 DIAGNOSIS — S76192D Other specified injury of left quadriceps muscle, fascia and tendon, subsequent encounter: Secondary | ICD-10-CM | POA: Diagnosis not present

## 2020-06-01 DIAGNOSIS — M1712 Unilateral primary osteoarthritis, left knee: Secondary | ICD-10-CM | POA: Diagnosis not present

## 2020-06-01 DIAGNOSIS — S76192D Other specified injury of left quadriceps muscle, fascia and tendon, subsequent encounter: Secondary | ICD-10-CM | POA: Diagnosis not present

## 2020-07-01 DIAGNOSIS — M1712 Unilateral primary osteoarthritis, left knee: Secondary | ICD-10-CM | POA: Diagnosis not present

## 2020-07-01 DIAGNOSIS — S76192D Other specified injury of left quadriceps muscle, fascia and tendon, subsequent encounter: Secondary | ICD-10-CM | POA: Diagnosis not present

## 2020-08-11 ENCOUNTER — Other Ambulatory Visit: Payer: Self-pay | Admitting: Primary Care

## 2020-08-11 DIAGNOSIS — E039 Hypothyroidism, unspecified: Secondary | ICD-10-CM

## 2020-08-11 DIAGNOSIS — E785 Hyperlipidemia, unspecified: Secondary | ICD-10-CM

## 2020-08-11 DIAGNOSIS — I10 Essential (primary) hypertension: Secondary | ICD-10-CM

## 2020-12-13 ENCOUNTER — Other Ambulatory Visit: Payer: Self-pay

## 2020-12-13 ENCOUNTER — Ambulatory Visit (INDEPENDENT_AMBULATORY_CARE_PROVIDER_SITE_OTHER): Payer: PPO

## 2020-12-13 ENCOUNTER — Telehealth: Payer: Self-pay

## 2020-12-13 DIAGNOSIS — Z0000 Encounter for general adult medical examination without abnormal findings: Secondary | ICD-10-CM | POA: Diagnosis not present

## 2020-12-13 DIAGNOSIS — M170 Bilateral primary osteoarthritis of knee: Secondary | ICD-10-CM

## 2020-12-13 NOTE — Progress Notes (Signed)
Subjective:   Kathleen Paul is a 85 y.o. female who presents for Medicare Annual (Subsequent) preventive examination.  Review of Systems: N/A      I connected with the patient today by telephone and verified that I am speaking with the correct person using two identifiers. Location patient: home Location nurse: work Persons participating in the telephone visit: patient, nurse.   I discussed the limitations, risks, security and privacy concerns of performing an evaluation and management service by telephone and the availability of in person appointments. I also discussed with the patient that there may be a patient responsible charge related to this service. The patient expressed understanding and verbally consented to this telephonic visit.        Cardiac Risk Factors include: advanced age (>76men, >39 women);hypertension;dyslipidemia     Objective:    Today's Vitals   There is no height or weight on file to calculate BMI.  Advanced Directives 12/13/2020 11/05/2019 07/01/2019 07/01/2019 06/27/2019 06/04/2019 06/03/2019  Does Patient Have a Medical Advance Directive? Yes Yes Yes Yes Yes Yes Yes  Type of Paramedic of [place];Living will [place];Living will [place];Living will [place];Living will Living will;Healthcare Power of [place];Living will  Does patient want to make changes to medical advance directive? - - No - Patient declined No - Patient declined No - Patient declined - No - Patient declined  Copy of [place] in Chart? Yes - validated most recent copy scanned in chart (See row information) Yes - validated most recent copy scanned in chart (See row information) - No - copy requested No - copy requested - No - copy requested    Current Medications (verified) Outpatient Encounter Medications as of 12/13/2020  Medication Sig  .  acetaminophen (TYLENOL) 500 MG tablet Take 2 tablets (1,000 mg total) by mouth every 8 (eight) hours.  . Calcium-Magnesium-Vitamin D (CALCIUM 1200+D3 PO) Take 1 tablet by mouth daily.  . enalapril-hydrochlorothiazide (VASERETIC) 10-25 MG tablet TAKE 1 TABLET BY MOUTH DAILY  . levothyroxine (SYNTHROID) 88 MCG tablet TAKE 1 TABLET BY MOUTH EVERY MORNING ON EMPTY STOMACH WITH WATER ONLY. NO FOOD OR OTHER MEDS FOR 30 MINUTES  . metoprolol tartrate (LOPRESSOR) 25 MG tablet TAKE 1 TABLET BY MOUTH EVERY EVENING  . Multiple Vitamin (MULTIVITAMIN PO) Take 1 tablet by mouth daily.  Marland Kitchen omeprazole (PRILOSEC) 40 MG capsule Take 1 capsule (40 mg total) by mouth daily.  Marland Kitchen OVER THE COUNTER MEDICATION Apply 1 application topically 2 (two) times daily as needed (for pain). OxyRub Cream  . simvastatin (ZOCOR) 20 MG tablet TAKE 1 TABLET(20 MG) BY MOUTH EVERY EVENING FOR CHOLESTEROL  . traMADol (ULTRAM) 50 MG tablet Take 1 tablet (50 mg total) by mouth every 12 (twelve) hours as needed for severe pain.   No facility-administered encounter medications on file as of 12/13/2020.    Allergies (verified) Codeine   History: Past Medical History:  Diagnosis Date  . Anxiety state, unspecified   . Arthritis   . Atherosclerosis   . Cancer ([place])    skin cancer  . Diverticulosis   . Esophageal stricture   . Essential hypertension, benign   . Gastric ulcer   . Hiatal hernia   . History of DVT (deep vein thrombosis) yrs ago   legs  . IDA (iron deficiency anemia)   . Inguinal hernia without mention of obstruction or gangrene, unilateral or unspecified, (not specified  as recurrent)    2014  . Internal hemorrhoids   . Mild mitral regurgitation    a. 04/2010 Echo: nl EF, mild diast dysfxn, trace PR, mild TR, mild-mod MR.  . Non-obstructive CAD    a. 04/2010 Myoview: EF 77%, fixed apical/inferior defect;  b. 04/2010 Cath: LAD 57m, LCX/RCA minor irregs, EF 60%;  c. 04/2011 Myoview: low risk.  . Pure hypercholesterolemia    . Rupture of left patellar tendon    05-26-2019  . Scoliosis   . UI (urinary incontinence)   . Umbilical hernia without mention of obstruction or gangrene    2014  . Unspecified hypothyroidism   . Vitamin D deficiency    Past Surgical History:  Procedure Laterality Date  . ABDOMINAL HYSTERECTOMY  2008   complete   . BIOPSY  04/02/2018   Procedure: BIOPSY;  Surgeon: Rush Landmark Telford Nab., MD;  Location: [facility];  Service: Gastroenterology;;  . CARDIAC CATHETERIZATION  2011   [facility]  . COLONOSCOPY  2003   [facility]  . ESOPHAGOGASTRODUODENOSCOPY (EGD) WITH PROPOFOL N/A 04/02/2018   Procedure: ESOPHAGOGASTRODUODENOSCOPY (EGD) WITH PROPOFOL;  Surgeon: Rush Landmark Telford Nab., MD;  Location: [facility];  Service: Gastroenterology;  Laterality: N/A;  . FOREIGN BODY REMOVAL  04/02/2018   Procedure: FOREIGN BODY REMOVAL;  Surgeon: Rush Landmark Telford Nab., MD;  Location: [facility];  Service: Gastroenterology;;  . HERNIA REPAIR Right 09/23/2012   repair RIH  . LAPAROSCOPIC HYSTERECTOMY    . PATELLAR TENDON REPAIR Left 06/03/2019   Procedure: LEFT PATELLA TENDON REPAIR, RIGHT KNEE INJECTION.;  Surgeon: Paralee Cancel, MD;  Location: [facility];  Service: Orthopedics;  Laterality: Left;  . PATELLAR TENDON REPAIR Left 07/01/2019   Procedure: OPEN PATELLA TENDON REPAIR;  Surgeon: Paralee Cancel, MD;  Location: [facility];  Service: Orthopedics;  Laterality: Left;  . TONSILLECTOMY    . TOTAL KNEE ARTHROPLASTY Left 02/11/2019   Procedure: TOTAL KNEE ARTHROPLASTY;  Surgeon: Paralee Cancel, MD;  Location: [facility];  Service: Orthopedics;  Laterality: Left;  70 mins   Family History  Problem Relation Age of Onset  . Heart disease Mother   . Heart attack Mother   . Heart disease Father   . Heart attack Father   . Breast cancer Sister   . Breast cancer Maternal Aunt   . Heart disease Brother        sister   Social History   Socioeconomic History  . Marital status: Widowed    Spouse  name: Not on file  . Number of children: Not on file  . Years of education: Not on file  . Highest education level: Not on file  Occupational History  . Not on file  Tobacco Use  . Smoking status: Former Smoker    Packs/day: 0.50    Years: 30.00    Pack years: 15.00    Types: Cigarettes    Quit date: 07/10/1988    Years since quitting: 32.4  . Smokeless tobacco: Never Used  Vaping Use  . Vaping Use: Never used  Substance and Sexual Activity  . Alcohol use: No  . Drug use: Never  . Sexual activity: Not on file  Other Topics Concern  . Not on file  Social History Narrative  . Not on file   Social Determinants of Health   Financial Resource Strain: Low Risk   . Difficulty of Paying Living Expenses: Not hard at all  Food Insecurity: No Food Insecurity  . Worried About Charity fundraiser in the Last  Year: Never true  . Ran Out of Food in the Last Year: Never true  Transportation Needs: No Transportation Needs  . Lack of Transportation (Medical): No  . Lack of Transportation (Non-Medical): No  Physical Activity: Inactive  . Days of Exercise per Week: 0 days  . Minutes of Exercise per Session: 0 min  Stress: No Stress Concern Present  . Feeling of Stress : Not at all  Social Connections: Not on file    Tobacco Counseling Counseling given: Not Answered   Clinical Intake:  Pre-visit preparation completed: Yes  Pain : 0-10 Pain Type: Chronic pain Pain Location: Knee Pain Descriptors / Indicators: Aching Pain Onset: More than a month ago Pain Frequency: Intermittent     Nutritional Risks: None Diabetes: No  How often do you need to have someone help you when you read instructions, pamphlets, or other written materials from your doctor or pharmacy?: 1 - Never  Diabetic: no Nutrition Risk Assessment:  Has the patient had any N/V/D within the last 2 months?  No  Does the patient have any non-healing wounds?  No  Has the patient had any unintentional weight loss  or weight gain?  No   Diabetes:  Is the patient diabetic?  no If diabetic, was a CBG obtained today?  N/A Did the patient bring in their glucometer from home?  N/A How often do you monitor your CBG's? N/A.   Financial Strains and Diabetes Management:  Are you having any financial strains with the device, your supplies or your medication? N/A.  Does the patient want to be seen by Chronic Care Management for management of their diabetes?  N/A Would the patient like to be referred to a Nutritionist or for Diabetic Management?  N/A Interpreter Needed?: No  Information entered by :: CJohnson, LPN   Activities of Daily Living In your present state of health, do you have any difficulty performing the following activities: 12/13/2020  Hearing? N  Vision? N  Difficulty concentrating or making decisions? N  Walking or climbing stairs? N  Dressing or bathing? N  Doing errands, shopping? N  Preparing Food and eating ? N  Using the Toilet? N  In the past six months, have you accidently leaked urine? Y  Comment Patient wears pads  Do you have problems with loss of bowel control? N  Managing your Medications? N  Managing your Finances? N  Housekeeping or managing your Housekeeping? N  Some recent data might be hidden    Patient Care Team: [name], NP as PCP - General (Internal Medicine) Fay Records, MD as PCP - Cardiology (Cardiology)  Indicate any recent Medical Services you may have received from other than [facility] providers in the past year (date may be approximate).     Assessment:   This is a routine wellness examination for Adonia.  Hearing/Vision screen  Hearing Screening   125Hz  250Hz  500Hz  1000Hz  2000Hz  3000Hz  4000Hz  6000Hz  8000Hz   Right ear:           Left ear:           Vision Screening Comments: Patient gets annual eye exams   Dietary issues and exercise activities discussed: Current Exercise Habits: The patient does not participate in regular exercise at  present, Exercise limited by: None identified  Goals Addressed            This Visit's Progress   . Patient Stated       12/13/2020, I will maintain and continue medications  as prescribed.      Depression Screen PHQ 2/9 Scores 12/13/2020 11/05/2019  PHQ - 2 Score 0 0  PHQ- 9 Score 0 0    Fall Risk Fall Risk  12/13/2020 11/05/2019 02/05/2019  Falls in the past year? 0 0 0  Comment - - Emmi Telephone Survey: data to providers prior to load  Number falls in past yr: 0 0 -  Injury with Fall? 0 0 -  Risk for fall due to : Medication side effect Impaired mobility;Impaired balance/gait -  Risk for fall due to: Comment - knee surgery -  Follow up Falls evaluation completed;Falls prevention discussed Falls evaluation completed;Falls prevention discussed -    FALL RISK PREVENTION PERTAINING TO THE HOME:  Any stairs in or around the home? Yes  If so, are there any without handrails? No  Home free of loose throw rugs in walkways, pet beds, electrical cords, etc? Yes  Adequate lighting in your home to reduce risk of falls? Yes   ASSISTIVE DEVICES UTILIZED TO PREVENT FALLS:  Life alert? No  Use of a cane, walker or w/c? Yes  Grab bars in the bathroom? Yes  Shower chair or bench in shower? Yes  Elevated toilet seat or a handicapped toilet? No   TIMED UP AND GO:  Was the test performed? N/A telephone visit .  Cognitive Function: MMSE - Mini Mental State Exam 12/13/2020 11/05/2019  Orientation to time 5 5  Orientation to Place 5 5  Registration 3 3  Attention/ Calculation 5 5  Recall 3 3  Language- repeat 1 1  Mini Cog  Mini-Cog screen was completed. Maximum score is 22. A value of 0 denotes this part of the MMSE was not completed or the patient failed this part of the Mini-Cog screening.       Immunizations Immunization History  Administered Date(s) Administered  . Influenza Split 04/02/2012  . Influenza, High Dose Seasonal PF 04/16/2017  . Influenza,inj,Quad PF,6+ Mos  04/29/2013, 04/18/2018, 04/29/2019, 04/19/2020  . Influenza-Unspecified 04/30/2016  . PFIZER(Purple Top)SARS-COV-2 Vaccination 09/24/2019, 10/15/2019  . Pneumococcal Conjugate-13 04/29/2019  . Pneumococcal Polysaccharide-23 04/18/2018    TDAP status: Due, Education has been provided regarding the importance of this vaccine. Advised may receive this vaccine at local pharmacy or Health Dept. Aware to provide a copy of the vaccination record if obtained from local pharmacy or Health Dept. Verbalized acceptance and understanding.  Flu Vaccine status: Up to date  Pneumococcal vaccine status: Up to date  Covid-19 vaccine status: 2 doses completed booster is due. Patient has appointment scheduled.  Qualifies for Shingles Vaccine? Yes   Zostavax completed No   Shingrix Completed?: No.    Education has been provided regarding the importance of this vaccine. Patient has been advised to call insurance company to determine out of pocket expense if they have not yet received this vaccine. Advised may also receive vaccine at local pharmacy or Health Dept. Verbalized acceptance and understanding.  Screening Tests Health Maintenance  Topic Date Due  . Pneumococcal Vaccine 37-106 Years old (1 of 4 - PCV13) Never done  . Zoster Vaccines- Shingrix (1 of 2) Never done  . COVID-19 Vaccine (3 - Booster for Pfizer series) 03/16/2020  . DEXA SCAN  11/05/2023 (Originally 11/13/2002)  . TETANUS/TDAP  11/05/2023 (Originally 11/12/1954)  . INFLUENZA VACCINE  02/07/2021  . PNA vac Low Risk Adult  Completed  . HPV VACCINES  Aged Out    Health Maintenance  Health Maintenance Due  Topic Date Due  .  Pneumococcal Vaccine 28-26 Years old (1 of 4 - PCV13) Never done  . Zoster Vaccines- Shingrix (1 of 2) Never done  . COVID-19 Vaccine (3 - Booster for Pfizer series) 03/16/2020    Colorectal cancer screening: No longer required.   Mammogram status: No longer required due to age.  Bone Density status: decline  Lung  Cancer Screening: (Low Dose CT Chest recommended if Age 100-80 years, 30 pack-year currently smoking OR have quit w/in 15years.) does not qualify.      Additional Screening:  Hepatitis C Screening: does not qualify; Completed N/A  Vision Screening: Recommended annual ophthalmology exams for early detection of glaucoma and other disorders of the eye. Is the patient up to date with their annual eye exam?  Yes  Who is the provider or what is the name of the office in which the patient attends annual eye exams? [facility] If pt is not established with a provider, would they like to be referred to a provider to establish care? No .   Dental Screening: Recommended annual dental exams for proper oral hygiene  Community Resource Referral / Chronic Care Management: CRR required this visit?  No   CCM required this visit?  No      Plan:     I have personally reviewed and noted the following in the patient's chart:   . Medical and social history . Use of alcohol, tobacco or illicit drugs  . Current medications and supplements including opioid prescriptions.  . Functional ability and status . Nutritional status . Physical activity . Advanced directives . List of other physicians . Hospitalizations, surgeries, and ER visits in previous 12 months . Vitals . Screenings to include cognitive, depression, and falls . Referrals and appointments  In addition, I have reviewed and discussed with patient certain preventive protocols, quality metrics, and best practice recommendations. A written personalized care plan for preventive services as well as general preventive health recommendations were provided to patient.   Due to this being a telephonic visit, the after visit summary with patients personalized plan was offered to patient via office or my-chart. Patient preferred to pick up at office at next visit or via mychart.   Andrez Grime, LPN   [id number]

## 2020-12-13 NOTE — Telephone Encounter (Signed)
Patient would like a refill on her tramadol sent to [facility] on [address].

## 2020-12-13 NOTE — Patient Instructions (Signed)
Kathleen Paul , Thank you for taking time to come for your Medicare Wellness Visit. I appreciate your ongoing commitment to your health goals. Please review the following plan we discussed and let me know if I can assist you in the future.   Screening recommendations/referrals: Colonoscopy: no longer required  Mammogram: no longer required  Bone Density: declined  Recommended yearly ophthalmology/optometry visit for glaucoma screening and checkup Recommended yearly dental visit for hygiene and checkup  Vaccinations: Influenza vaccine: Up to date, completed 04/19/2020, due 02/2021 Pneumococcal vaccine: Completed series Tdap vaccine: decline-insurance  Shingles vaccine: due, check with your insurance regarding coverage if interested    Covid-19:2 doses completed booster is due. Patient has appointment scheduled.  Advanced directives: copy in chart  Conditions/risks identified: hypertension, dyslipidemia   Next appointment: Follow up in one year for your annual wellness visit    Preventive Care 85 Years and Older, Female Preventive care refers to lifestyle choices and visits with your health care provider that can promote health and wellness. What does preventive care include?  A yearly physical exam. This is also called an annual well check.  Dental exams once or twice a year.  Routine eye exams. Ask your health care provider how often you should have your eyes checked.  Personal lifestyle choices, including:  Daily care of your teeth and gums.  Regular physical activity.  Eating a healthy diet.  Avoiding tobacco and drug use.  Limiting alcohol use.  Practicing safe sex.  Taking low-dose aspirin every day.  Taking vitamin and mineral supplements as recommended by your health care provider. What happens during an annual well check? The services and screenings done by your health care provider during your annual well check will depend on your age, overall health,  lifestyle risk factors, and family history of disease. Counseling  Your health care provider may ask you questions about your:  Alcohol use.  Tobacco use.  Drug use.  Emotional well-being.  Home and relationship well-being.  Sexual activity.  Eating habits.  History of falls.  Memory and ability to understand (cognition).  Work and work Statistician.  Reproductive health. Screening  You may have the following tests or measurements:  Height, weight, and BMI.  Blood pressure.  Lipid and cholesterol levels. These may be checked every 5 years, or more frequently if you are over 8 years old.  Skin check.  Lung cancer screening. You may have this screening every year starting at age 9 if you have a 30-pack-year history of smoking and currently smoke or have quit within the past 15 years.  Fecal occult blood test (FOBT) of the stool. You may have this test every year starting at age 61.  Flexible sigmoidoscopy or colonoscopy. You may have a sigmoidoscopy every 5 years or a colonoscopy every 10 years starting at age 79.  Hepatitis C blood test.  Hepatitis B blood test.  Sexually transmitted disease (STD) testing.  Diabetes screening. This is done by checking your blood sugar (glucose) after you have not eaten for a while (fasting). You may have this done every 1-3 years.  Bone density scan. This is done to screen for osteoporosis. You may have this done starting at age 61.  Mammogram. This may be done every 1-2 years. Talk to your health care provider about how often you should have regular mammograms. Talk with your health care provider about your test results, treatment options, and if necessary, the need for more tests. Vaccines  Your health care provider may  recommend certain vaccines, such as:  Influenza vaccine. This is recommended every year.  Tetanus, diphtheria, and acellular pertussis (Tdap, Td) vaccine. You may need a Td booster every 10 years.  Zoster  vaccine. You may need this after age 71.  Pneumococcal 13-valent conjugate (PCV13) vaccine. One dose is recommended after age 66.  Pneumococcal polysaccharide (PPSV23) vaccine. One dose is recommended after age 48. Talk to your health care provider about which screenings and vaccines you need and how often you need them. This information is not intended to replace advice given to you by your health care provider. Make sure you discuss any questions you have with your health care provider. Document Released: 07/23/2015 Document Revised: 03/15/2016 Document Reviewed: 04/27/2015 Elsevier Interactive Patient Education  2017 [place] Prevention in the Home Falls can cause injuries. They can happen to people of all ages. There are many things you can do to make your home safe and to help prevent falls. What can I do on the outside of my home?  Regularly fix the edges of walkways and driveways and fix any cracks.  Remove anything that might make you trip as you walk through a door, such as a raised step or threshold.  Trim any bushes or trees on the path to your home.  Use bright outdoor lighting.  Clear any walking paths of anything that might make someone trip, such as rocks or tools.  Regularly check to see if handrails are loose or broken. Make sure that both sides of any steps have handrails.  Any raised decks and porches should have guardrails on the edges.  Have any leaves, snow, or ice cleared regularly.  Use sand or salt on walking paths during winter.  Clean up any spills in your garage right away. This includes oil or grease spills. What can I do in the bathroom?  Use night lights.  Install grab bars by the toilet and in the tub and shower. Do not use towel bars as grab bars.  Use non-skid mats or decals in the tub or shower.  If you need to sit down in the shower, use a plastic, non-slip stool.  Keep the floor dry. Clean up any water that spills on the  floor as soon as it happens.  Remove soap buildup in the tub or shower regularly.  Attach bath mats securely with double-sided non-slip rug tape.  Do not have throw rugs and other things on the floor that can make you trip. What can I do in the bedroom?  Use night lights.  Make sure that you have a light by your bed that is easy to reach.  Do not use any sheets or blankets that are too big for your bed. They should not hang down onto the floor.  Have a firm chair that has side arms. You can use this for support while you get dressed.  Do not have throw rugs and other things on the floor that can make you trip. What can I do in the kitchen?  Clean up any spills right away.  Avoid walking on wet floors.  Keep items that you use a lot in easy-to-reach places.  If you need to reach something above you, use a strong step stool that has a grab bar.  Keep electrical cords out of the way.  Do not use floor polish or wax that makes floors slippery. If you must use wax, use non-skid floor wax.  Do not have throw rugs and  other things on the floor that can make you trip. What can I do with my stairs?  Do not leave any items on the stairs.  Make sure that there are handrails on both sides of the stairs and use them. Fix handrails that are broken or loose. Make sure that handrails are as long as the stairways.  Check any carpeting to make sure that it is firmly attached to the stairs. Fix any carpet that is loose or worn.  Avoid having throw rugs at the top or bottom of the stairs. If you do have throw rugs, attach them to the floor with carpet tape.  Make sure that you have a light switch at the top of the stairs and the bottom of the stairs. If you do not have them, ask someone to add them for you. What else can I do to help prevent falls?  Wear shoes that:  Do not have high heels.  Have rubber bottoms.  Are comfortable and fit you well.  Are closed at the toe. Do not wear  sandals.  If you use a stepladder:  Make sure that it is fully opened. Do not climb a closed stepladder.  Make sure that both sides of the stepladder are locked into place.  Ask someone to hold it for you, if possible.  Clearly mark and make sure that you can see:  Any grab bars or handrails.  First and last steps.  Where the edge of each step is.  Use tools that help you move around (mobility aids) if they are needed. These include:  Canes.  Walkers.  Scooters.  Crutches.  Turn on the lights when you go into a dark area. Replace any light bulbs as soon as they burn out.  Set up your furniture so you have a clear path. Avoid moving your furniture around.  If any of your floors are uneven, fix them.  If there are any pets around you, be aware of where they are.  Review your medicines with your doctor. Some medicines can make you feel dizzy. This can increase your chance of falling. Ask your doctor what other things that you can do to help prevent falls. This information is not intended to replace advice given to you by your health care provider. Make sure you discuss any questions you have with your health care provider. Document Released: 04/22/2009 Document Revised: 12/02/2015 Document Reviewed: 07/31/2014 Elsevier Interactive Patient Education  2017 [employer].

## 2020-12-13 NOTE — Progress Notes (Signed)
PCP notes:  Health Maintenance: Shingrix- due  Covid booster- appointment scheduled per patient    Abnormal Screenings: none   Patient concerns: none   Nurse concerns: none   Next PCP appt.: none

## 2020-12-14 NOTE — Telephone Encounter (Signed)
Called appointment made with patient will call if any questions.

## 2020-12-14 NOTE — Telephone Encounter (Signed)
Patient has not been seen by me since May 2021 and must be seen at least once annually for ANY refills of medications. Wynona Luna for you.  Joellen, please have patient scheduled. I cannot refill until she's seen. Needs CPE, can stick her in anywhere in the schedule.

## 2020-12-15 NOTE — Telephone Encounter (Signed)
Called and spoke to patient per phone message is aware she need to be seen for more refills. Have made appointment with her. No further action at this time.

## 2020-12-22 ENCOUNTER — Encounter: Payer: Self-pay | Admitting: Primary Care

## 2020-12-22 ENCOUNTER — Other Ambulatory Visit: Payer: Self-pay | Admitting: Primary Care

## 2020-12-22 ENCOUNTER — Ambulatory Visit (INDEPENDENT_AMBULATORY_CARE_PROVIDER_SITE_OTHER): Payer: PPO | Admitting: Primary Care

## 2020-12-22 ENCOUNTER — Other Ambulatory Visit: Payer: Self-pay

## 2020-12-22 VITALS — BP 126/68 | HR 76 | Temp 97.1°F | Ht 60.0 in | Wt 155.0 lb

## 2020-12-22 DIAGNOSIS — Z0000 Encounter for general adult medical examination without abnormal findings: Secondary | ICD-10-CM | POA: Diagnosis not present

## 2020-12-22 DIAGNOSIS — K2100 Gastro-esophageal reflux disease with esophagitis, without bleeding: Secondary | ICD-10-CM | POA: Diagnosis not present

## 2020-12-22 DIAGNOSIS — F411 Generalized anxiety disorder: Secondary | ICD-10-CM | POA: Diagnosis not present

## 2020-12-22 DIAGNOSIS — I2510 Atherosclerotic heart disease of native coronary artery without angina pectoris: Secondary | ICD-10-CM | POA: Diagnosis not present

## 2020-12-22 DIAGNOSIS — E785 Hyperlipidemia, unspecified: Secondary | ICD-10-CM | POA: Diagnosis not present

## 2020-12-22 DIAGNOSIS — M19012 Primary osteoarthritis, left shoulder: Secondary | ICD-10-CM

## 2020-12-22 DIAGNOSIS — M19041 Primary osteoarthritis, right hand: Secondary | ICD-10-CM | POA: Diagnosis not present

## 2020-12-22 DIAGNOSIS — M19011 Primary osteoarthritis, right shoulder: Secondary | ICD-10-CM

## 2020-12-22 DIAGNOSIS — E039 Hypothyroidism, unspecified: Secondary | ICD-10-CM

## 2020-12-22 DIAGNOSIS — I10 Essential (primary) hypertension: Secondary | ICD-10-CM

## 2020-12-22 DIAGNOSIS — M19071 Primary osteoarthritis, right ankle and foot: Secondary | ICD-10-CM

## 2020-12-22 DIAGNOSIS — M19072 Primary osteoarthritis, left ankle and foot: Secondary | ICD-10-CM

## 2020-12-22 DIAGNOSIS — M170 Bilateral primary osteoarthritis of knee: Secondary | ICD-10-CM

## 2020-12-22 DIAGNOSIS — N289 Disorder of kidney and ureter, unspecified: Secondary | ICD-10-CM | POA: Diagnosis not present

## 2020-12-22 DIAGNOSIS — M19042 Primary osteoarthritis, left hand: Secondary | ICD-10-CM

## 2020-12-22 LAB — COMPREHENSIVE METABOLIC PANEL
ALT: 9 U/L (ref 0–35)
AST: 18 U/L (ref 0–37)
Albumin: 4.7 g/dL (ref 3.5–5.2)
Alkaline Phosphatase: 62 U/L (ref 39–117)
BUN: 80 mg/dL — ABNORMAL HIGH (ref 6–23)
CO2: 26 mEq/L (ref 19–32)
Calcium: 9.5 mg/dL (ref 8.4–10.5)
Chloride: 100 mEq/L (ref 96–112)
Creatinine, Ser: 1.42 mg/dL — ABNORMAL HIGH (ref 0.40–1.20)
GFR: 34.3 mL/min — ABNORMAL LOW (ref >60.00)
Glucose, Bld: 92 mg/dL (ref 70–99)
Potassium: 4.8 mEq/L (ref 3.5–5.1)
Sodium: 138 mEq/L (ref 135–145)
Total Bilirubin: 0.8 mg/dL (ref 0.2–1.2)
Total Protein: 7.3 g/dL (ref 6.0–8.3)

## 2020-12-22 LAB — LIPID PANEL
Cholesterol: 147 mg/dL (ref 0–200)
HDL: 57.8 mg/dL (ref >39.00)
LDL Cholesterol: 64 mg/dL (ref 0–99)
NonHDL: 89.46
Total CHOL/HDL Ratio: 3
Triglycerides: 129 mg/dL (ref 0.0–149.0)
VLDL: 25.8 mg/dL (ref 0.0–40.0)

## 2020-12-22 LAB — CBC
HCT: 33.4 % — ABNORMAL LOW (ref 36.0–46.0)
Hemoglobin: 11.1 g/dL — ABNORMAL LOW (ref 12.0–15.0)
MCHC: 33.4 g/dL (ref 30.0–36.0)
MCV: 95.5 fl (ref 78.0–100.0)
Platelets: 189 10*3/uL (ref 150.0–400.0)
RBC: 3.49 Mil/uL — ABNORMAL LOW (ref 3.87–5.11)
RDW: 13.8 % (ref 11.5–15.5)
WBC: 6.8 10*3/uL (ref 4.0–10.5)

## 2020-12-22 LAB — TSH: TSH: 0.37 u[IU]/mL (ref 0.35–4.50)

## 2020-12-22 MED ORDER — TRAMADOL HCL 50 MG PO TABS: 50.0000 mg | ORAL_TABLET | Freq: Every day | ORAL | 0 refills | Status: DC | PRN

## 2020-12-22 NOTE — Assessment & Plan Note (Signed)
Failed three surgeries. No longer with orthopedics.  Chronic and continued. Agree to refill Tramadol 50 mg for daily to every other day use. She does very well on this regimen.

## 2020-12-22 NOTE — Assessment & Plan Note (Signed)
Chronic and continued. Agree to refill Tramadol 50 mg for daily to every other day use. She does very well on this regimen.

## 2020-12-22 NOTE — Assessment & Plan Note (Signed)
Asymptomatic.  Continue metoprolol tartrate 25 mg daily, BP control, lipid control.

## 2020-12-22 NOTE — Assessment & Plan Note (Signed)
Declines Shingrix, other vaccines UTD. Declines mammogram, bone density scan. Colonoscopy N/A given age.  Encouraged daily walking, hopefully Tramadol will help with mobility.  Exam today as noted. Labs pending.

## 2020-12-22 NOTE — Assessment & Plan Note (Signed)
Stable in the office today, continue lisinopril-HCTZ 10-25 mg and metoprolol tartrate 25 mg.   CMP pending.

## 2020-12-22 NOTE — Progress Notes (Signed)
Subjective:    Patient ID: Kathleen Paul, female    DOB: 10-25-1935, 85 y.o.   MRN: [id number]  HPI  Kathleen Paul is a very pleasant 85 y.o. female who presents today for complete physical.  Today she endorses chronic pain to her hips, shoulder, knees. She is active around the home when able, but her overall activity level has declined given her chronic pain. She's had three surgeries to the left knee which have all been unsuccessful. She is no longer seeing orthopedics as "there's nothing they can do". She uses her walker and cane to ambulate. She denies falls.   She lives alone but her daughter is with her during the evening. She is requesting a refill of her Tramadol as it helps her to sleep and relax during the day. She historically takes Tramadol every day to every other day. She is taking Extra Strength Tylenol 2000 mg daily.   Immunizations: -Influenza: Completed last season  -Covid-19: two vaccines -Shingles: Never completed, declines  -Pneumonia: Prevnar 13 in 2020, Pneumovax in 2019  Diet: [place].  Exercise: No regular exercise.  Eye exam: Completes annually  Dental exam: Completes semi-annually   Mammogram: Declines given age Dexa: No recent scan, declines  Colonoscopy: N/A given age   BP Readings from Last 3 Encounters:  12/22/20 126/68  04/19/20 126/70  11/19/19 120/72      Review of Systems  Constitutional:  Negative for unexpected weight change.  HENT:  Negative for rhinorrhea.   Eyes:  Negative for visual disturbance.  Respiratory:  Negative for shortness of breath.   Cardiovascular:  Negative for chest pain.  Gastrointestinal:  Negative for constipation and diarrhea.  Genitourinary:  Negative for difficulty urinating.  Musculoskeletal:  Positive for arthralgias.  Skin:  Negative for rash.  Allergic/Immunologic: Negative for environmental allergies.  Neurological:  Negative for dizziness and headaches.  Psychiatric/Behavioral:  The patient is  not nervous/anxious.         Past Medical History:  Diagnosis Date   Anxiety state, unspecified    Arthritis    Atherosclerosis    Cancer ([place])    skin cancer   Diverticulosis    Esophageal stricture    Essential hypertension, benign    Gastric ulcer    Hiatal hernia    History of DVT (deep vein thrombosis) yrs ago   legs   IDA (iron deficiency anemia)    Inguinal hernia without mention of obstruction or gangrene, unilateral or unspecified, (not specified as recurrent)    2014   Internal hemorrhoids    Mild mitral regurgitation    a. 04/2010 Echo: nl EF, mild diast dysfxn, trace PR, mild TR, mild-mod MR.   Non-obstructive CAD    a. 04/2010 Myoview: EF 77%, fixed apical/inferior defect;  b. 04/2010 Cath: LAD 11m, LCX/RCA minor irregs, EF 60%;  c. 04/2011 Myoview: low risk.   Pure hypercholesterolemia    Rupture of left patellar tendon    05-26-2019   Scoliosis    UI (urinary incontinence)    Umbilical hernia without mention of obstruction or gangrene    2014   Unspecified hypothyroidism    Vitamin D deficiency     Social History   Socioeconomic History   Marital status: Widowed    Spouse name: Not on file   Number of children: Not on file   Years of education: Not on file   Highest education level: Not on file  Occupational History   Not on file  Tobacco  Use   Smoking status: Former    Packs/day: 0.50    Years: 30.00    Pack years: 15.00    Types: Cigarettes    Quit date: 07/10/1988    Years since quitting: 32.4   Smokeless tobacco: Never  Vaping Use   Vaping Use: Never used  Substance and Sexual Activity   Alcohol use: No   Drug use: Never   Sexual activity: Not on file  Other Topics Concern   Not on file  Social History Narrative   Not on file   Social Determinants of Health   Financial Resource Strain: Low Risk    Difficulty of Paying Living Expenses: Not hard at all  Food Insecurity: No Food Insecurity   Worried About Charity fundraiser in  the Last Year: Never true   Ran Out of Food in the Last Year: Never true  Transportation Needs: No Transportation Needs   Lack of Transportation (Medical): No   Lack of Transportation (Non-Medical): No  Physical Activity: Inactive   Days of Exercise per Week: 0 days   Minutes of Exercise per Session: 0 min  Stress: No Stress Concern Present   Feeling of Stress : Not at all  Social Connections: Not on file  Intimate Partner Violence: Not At Risk   Fear of Current or Ex-Partner: No   Emotionally Abused: No   Physically Abused: No   Sexually Abused: No    Past Surgical History:  Procedure Laterality Date   ABDOMINAL HYSTERECTOMY  2008   complete    BIOPSY  04/02/2018   Procedure: BIOPSY;  Surgeon: Irving Copas., MD;  Location: [facility];  Service: Gastroenterology;;   CARDIAC CATHETERIZATION  2011   [facility]   COLONOSCOPY  2003   [facility]   ESOPHAGOGASTRODUODENOSCOPY (EGD) WITH PROPOFOL N/A 04/02/2018   Procedure: ESOPHAGOGASTRODUODENOSCOPY (EGD) WITH PROPOFOL;  Surgeon: Irving Copas., MD;  Location: [facility];  Service: Gastroenterology;  Laterality: N/A;   FOREIGN BODY REMOVAL  04/02/2018   Procedure: FOREIGN BODY REMOVAL;  Surgeon: Rush Landmark Telford Nab., MD;  Location: [facility];  Service: Gastroenterology;;   HERNIA REPAIR Right 09/23/2012   repair [facility]   LAPAROSCOPIC HYSTERECTOMY     PATELLAR TENDON REPAIR Left 06/03/2019   Procedure: LEFT PATELLA TENDON REPAIR, RIGHT KNEE INJECTION.;  Surgeon: Paralee Cancel, MD;  Location: [facility];  Service: Orthopedics;  Laterality: Left;   PATELLAR TENDON REPAIR Left 07/01/2019   Procedure: OPEN PATELLA TENDON REPAIR;  Surgeon: Paralee Cancel, MD;  Location: [facility];  Service: Orthopedics;  Laterality: Left;   TONSILLECTOMY     TOTAL KNEE ARTHROPLASTY Left 02/11/2019   Procedure: TOTAL KNEE ARTHROPLASTY;  Surgeon: Paralee Cancel, MD;  Location: [facility];  Service: Orthopedics;  Laterality: Left;  70 mins     Family History  Problem Relation Age of Onset   Heart disease Mother    Heart attack Mother    Heart disease Father    Heart attack Father    Breast cancer Sister    Breast cancer Maternal Aunt    Heart disease Brother        sister    Allergies  Allergen Reactions   Codeine Nausea Only    Current Outpatient Medications on File Prior to Visit  Medication Sig Dispense Refill   acetaminophen (TYLENOL) 500 MG tablet Take 2 tablets (1,000 mg total) by mouth every 8 (eight) hours. 30 tablet 0   Calcium-Magnesium-Vitamin D (CALCIUM 1200+D3 PO) Take 1 tablet by mouth daily.  enalapril-hydrochlorothiazide (VASERETIC) 10-25 MG tablet TAKE 1 TABLET BY MOUTH DAILY 90 tablet 1   levothyroxine (SYNTHROID) 88 MCG tablet TAKE 1 TABLET BY MOUTH EVERY MORNING ON EMPTY STOMACH WITH WATER ONLY. NO FOOD OR OTHER MEDS FOR 30 MINUTES 90 tablet 1   metoprolol tartrate (LOPRESSOR) 25 MG tablet TAKE 1 TABLET BY MOUTH EVERY EVENING 90 tablet 1   Multiple Vitamin (MULTIVITAMIN PO) Take 1 tablet by mouth daily.     OVER THE COUNTER MEDICATION Apply 1 application topically 2 (two) times daily as needed (for pain). OxyRub Cream     simvastatin (ZOCOR) 20 MG tablet TAKE 1 TABLET(20 MG) BY MOUTH EVERY EVENING FOR CHOLESTEROL 90 tablet 1   No current facility-administered medications on file prior to visit.    BP 126/68   Pulse 76   Temp (!) 97.1 F (36.2 C) (Temporal)   Ht 5' (1.524 m)   Wt 155 lb (70.3 kg)   SpO2 98%   BMI 30.27 kg/m  Objective:   Physical Exam HENT:     Right Ear: Tympanic membrane and ear canal normal.     Left Ear: Tympanic membrane and ear canal normal.     Nose: Nose normal.  Eyes:     Conjunctiva/sclera: Conjunctivae normal.     Pupils: Pupils are equal, round, and reactive to light.  Neck:     Thyroid: No thyromegaly.  Cardiovascular:     Rate and Rhythm: Normal rate and regular rhythm.     Heart sounds: No murmur heard. Pulmonary:     Effort: Pulmonary effort  is normal.     Breath sounds: Normal breath sounds. No rales.  Abdominal:     General: Bowel sounds are normal.     Palpations: Abdomen is soft.     Tenderness: There is no abdominal tenderness.  Musculoskeletal:     Cervical back: Neck supple.     Comments: Decrease in ROM to knees, lower back. Ambulating with walker, stable.   Lymphadenopathy:     Cervical: No cervical adenopathy.  Skin:    General: Skin is warm and dry.     Findings: No rash.  Neurological:     Mental Status: She is alert and oriented to person, place, and time.     Cranial Nerves: No cranial nerve deficit.     Deep Tendon Reflexes: Reflexes are normal and symmetric.  Psychiatric:        Mood and Affect: Mood normal.          Assessment & Plan:      This visit occurred during the SARS-CoV-2 public health emergency.  Safety protocols were in place, including screening questions prior to the visit, additional usage of staff PPE, and extensive cleaning of exam room while observing appropriate contact time as indicated for disinfecting solutions.

## 2020-12-22 NOTE — Assessment & Plan Note (Signed)
Following with nephrology through [facility].

## 2020-12-22 NOTE — Patient Instructions (Signed)
You may take the Tramadol medication daily as needed for pain.  Stop by the lab prior to leaving today. I will notify you of your results once received.   It was a pleasure to see you today!  Preventive Care 83 Years and Older, Female Preventive care refers to lifestyle choices and visits with your health care provider that can promote health and wellness. This includes: A yearly physical exam. This is also called an annual wellness visit. Regular dental and eye exams. Immunizations. Screening for certain conditions. Healthy lifestyle choices, such as: Eating a healthy diet. Getting regular exercise. Not using drugs or products that contain nicotine and tobacco. Limiting alcohol use. What can I expect for my preventive care visit? Physical exam Your health care provider will check your: Height and weight. These may be used to calculate your BMI (body mass index). BMI is a measurement that tells if you are at a healthy weight. Heart rate and blood pressure. Body temperature. Skin for abnormal spots. Counseling Your health care provider may ask you questions about your: Past medical problems. Family's medical history. Alcohol, tobacco, and drug use. Emotional well-being. Home life and relationship well-being. Sexual activity. Diet, exercise, and sleep habits. History of falls. Memory and ability to understand (cognition). Work and work Statistician. Pregnancy and menstrual history. Access to firearms. What immunizations do I need?  Vaccines are usually given at various ages, according to a schedule. Your health care provider will recommend vaccines for you based on your age, medicalhistory, and lifestyle or other factors, such as travel or where you work. What tests do I need? Blood tests Lipid and cholesterol levels. These may be checked every 5 years, or more often depending on your overall health. Hepatitis C test. Hepatitis B test. Screening Lung cancer screening. You  may have this screening every year starting at age 55 if you have a 30-pack-year history of smoking and currently smoke or have quit within the past 15 years. Colorectal cancer screening. All adults should have this screening starting at age 70 and continuing until age 76. Your health care provider may recommend screening at age 55 if you are at increased risk. You will have tests every 1-10 years, depending on your results and the type of screening test. Diabetes screening. This is done by checking your blood sugar (glucose) after you have not eaten for a while (fasting). You may have this done every 1-3 years. Mammogram. This may be done every 1-2 years. Talk with your health care provider about how often you should have regular mammograms. Abdominal aortic aneurysm (AAA) screening. You may need this if you are a current or former smoker. BRCA-related cancer screening. This may be done if you have a family history of breast, ovarian, tubal, or peritoneal cancers. Other tests STD (sexually transmitted disease) testing, if you are at risk. Bone density scan. This is done to screen for osteoporosis. You may have this done starting at age 21. Talk with your health care provider about your test results, treatment options,and if necessary, the need for more tests. Follow these instructions at home: Eating and drinking  Eat a diet that includes fresh fruits and vegetables, whole grains, lean protein, and low-fat dairy products. Limit your intake of foods with high amounts of sugar, saturated fats, and salt. Take vitamin and mineral supplements as recommended by your health care provider. Do not drink alcohol if your health care provider tells you not to drink. If you drink alcohol: Limit how much you have  to 0-1 drink a day. Be aware of how much alcohol is in your drink. In the [place] one drink equals one 12 oz bottle of beer (355 mL), one 5 oz glass of wine (148 mL), or one 1 oz glass of hard  liquor (44 mL).  Lifestyle Take daily care of your teeth and gums. Brush your teeth every morning and night with fluoride toothpaste. Floss one time each day. Stay active. Exercise for at least 30 minutes 5 or more days each week. Do not use any products that contain nicotine or tobacco, such as cigarettes, e-cigarettes, and chewing tobacco. If you need help quitting, ask your health care provider. Do not use drugs. If you are sexually active, practice safe sex. Use a condom or other form of protection in order to prevent STIs (sexually transmitted infections). Talk with your health care provider about taking a low-dose aspirin or statin. Find healthy ways to cope with stress, such as: Meditation, yoga, or listening to music. Journaling. Talking to a trusted person. Spending time with friends and family. Safety Always wear your seat belt while driving or riding in a vehicle. Do not drive: If you have been drinking alcohol. Do not ride with someone who has been drinking. When you are tired or distracted. While texting. Wear a helmet and other protective equipment during sports activities. If you have firearms in your house, make sure you follow all gun safety procedures. What's next? Visit your health care provider once a year for an annual wellness visit. Ask your health care provider how often you should have your eyes and teeth checked. Stay up to date on all vaccines. This information is not intended to replace advice given to you by your health care provider. Make sure you discuss any questions you have with your healthcare provider. Document Revised: 06/16/2020 Document Reviewed: 06/20/2018 Elsevier Patient Education  2022 [employer].

## 2020-12-22 NOTE — Assessment & Plan Note (Signed)
Denies concerns today, continue to monitor.  

## 2020-12-22 NOTE — Assessment & Plan Note (Signed)
Overall stable, denies concerns today except for chronic pain.

## 2020-12-22 NOTE — Assessment & Plan Note (Signed)
Compliant to simvastatin 20 mg, continue same. Repeat lipid panel pending.

## 2021-02-04 ENCOUNTER — Other Ambulatory Visit (INDEPENDENT_AMBULATORY_CARE_PROVIDER_SITE_OTHER): Payer: PPO

## 2021-02-04 ENCOUNTER — Telehealth: Payer: Self-pay

## 2021-02-04 ENCOUNTER — Other Ambulatory Visit: Payer: Self-pay

## 2021-02-04 DIAGNOSIS — E039 Hypothyroidism, unspecified: Secondary | ICD-10-CM | POA: Diagnosis not present

## 2021-02-04 LAB — TSH: TSH: 3.97 u[IU]/mL (ref 0.35–5.50)

## 2021-02-04 NOTE — Telephone Encounter (Signed)
[place] Night - Client Nonclinical Telephone Record  [facility] Client [name] Client Site [place] Physician Alma Friendly - NP Contact Type Call Who Is Calling Patient / Member / Family / Caregiver Caller Name Kathleen Paul Caller Phone Number [phone] Patient Name Kathleen Paul Patient DOB Mar 25, 1936 Call Type Message Only Information Provided Reason for Call Request for General Office Information Initial Comment Caller states, she is there for blood work. Declined rn Additional Comment Pt has mobility issues. Disp. Time Disposition Final User 02/04/2021 7:33:38 AM General Information Provided Yes Lonia Farber Call Closed By: Lonia Farber Transaction Date/Time: 02/04/2021 7:31:54 AM (ET)   This has been taken care of at the office

## 2021-02-09 ENCOUNTER — Other Ambulatory Visit: Payer: Self-pay | Admitting: Primary Care

## 2021-02-09 DIAGNOSIS — E785 Hyperlipidemia, unspecified: Secondary | ICD-10-CM

## 2021-02-09 DIAGNOSIS — I10 Essential (primary) hypertension: Secondary | ICD-10-CM

## 2021-02-09 DIAGNOSIS — E039 Hypothyroidism, unspecified: Secondary | ICD-10-CM

## 2021-03-24 ENCOUNTER — Telehealth (INDEPENDENT_AMBULATORY_CARE_PROVIDER_SITE_OTHER): Payer: PPO | Admitting: Primary Care

## 2021-03-24 ENCOUNTER — Encounter: Payer: Self-pay | Admitting: Primary Care

## 2021-03-24 ENCOUNTER — Other Ambulatory Visit: Payer: Self-pay

## 2021-03-24 VITALS — Ht 60.0 in | Wt 155.0 lb

## 2021-03-24 DIAGNOSIS — M170 Bilateral primary osteoarthritis of knee: Secondary | ICD-10-CM

## 2021-03-24 DIAGNOSIS — M19071 Primary osteoarthritis, right ankle and foot: Secondary | ICD-10-CM | POA: Diagnosis not present

## 2021-03-24 DIAGNOSIS — M19072 Primary osteoarthritis, left ankle and foot: Secondary | ICD-10-CM | POA: Diagnosis not present

## 2021-03-24 DIAGNOSIS — M19041 Primary osteoarthritis, right hand: Secondary | ICD-10-CM | POA: Diagnosis not present

## 2021-03-24 DIAGNOSIS — M19042 Primary osteoarthritis, left hand: Secondary | ICD-10-CM

## 2021-03-24 NOTE — Assessment & Plan Note (Signed)
Agree that patient would benefit from a sleeper lift chair given her extensive osteoarthritis to multiple joints.  Order placed for DME. She will notify us once she finds a store with a chair she prefers.

## 2021-03-24 NOTE — Progress Notes (Signed)
Patient ID: Kathleen Paul, female    DOB: [birth year], 85 y.o.   MRN: [id number]  Virtual visit completed through [employer], a video enabled telemedicine application. Due to national recommendations of social distancing due to COVID-19, a virtual visit is felt to be most appropriate for this patient at this time. Reviewed limitations, risks, security and privacy concerns of performing a virtual visit and the availability of in person appointments. I also reviewed that there may be a patient responsible charge related to this service. The patient agreed to proceed.   Patient location: home Provider location: [place] at [facility], office Persons participating in this virtual visit: patient, provider   If any vitals were documented, they were collected by patient at home unless specified below.    Ht 5' (1.524 m)   Wt 155 lb (70.3 kg)   BMI 30.27 kg/m    CC: Sleeper Chair Request Subjective:   HPI: Kathleen Paul is a 85 y.o. female with a history of CAD, hypertension, osteoarthritis of hands, knees, feet, shoulders, CKD, chronic pain presenting on 03/24/2021 for to request a sleeper lift chair.   She is requesting the sleeper chair lift as she cannot get in and out of her bed, she is afraid of falling when getting out of her bed, and her bed causes her great discomfort. She has been sleeping in a recliner for the last 2 years which is still uncomfortable and sometimes causes increased pain to her shoulders, neck, back.   She was staying out of town with some family who had a Chartered loss adjuster chair from [employer].  She slept in this chair for 3 nights and has had the best sleep she has had in 2 years.      Relevant past medical, surgical, family and social history reviewed and updated as indicated. Interim medical history since our last visit reviewed. Allergies and medications reviewed and updated. Outpatient Medications Prior to Visit  Medication Sig Dispense Refill   acetaminophen  (TYLENOL) 500 MG tablet Take 2 tablets (1,000 mg total) by mouth every 8 (eight) hours. 30 tablet 0   Calcium-Magnesium-Vitamin D (CALCIUM 1200+D3 PO) Take 1 tablet by mouth daily.     enalapril-hydrochlorothiazide (VASERETIC) 10-25 MG tablet Take 1 tablet by mouth daily. For blood pressure. 90 tablet 2   levothyroxine (SYNTHROID) 88 MCG tablet TAKE 1 TABLET BY MOUTH EVERY MORNING ON AN EMPTY STOMACH WITH WATER ONLY. NO FOOD OR OTHER MEDS FOR 30 MINUTES 90 tablet 2   metoprolol tartrate (LOPRESSOR) 25 MG tablet TAKE 1 TABLET BY MOUTH EVERY EVENING for blood pressure. 90 tablet 2   Multiple Vitamin (MULTIVITAMIN PO) Take 1 tablet by mouth daily.     OVER THE COUNTER MEDICATION Apply 1 application topically 2 (two) times daily as needed (for pain). OxyRub Cream     simvastatin (ZOCOR) 20 MG tablet TAKE 1 TABLET(20 MG) BY MOUTH EVERY EVENING FOR CHOLESTEROL 90 tablet 2   traMADol (ULTRAM) 50 MG tablet Take 1 tablet (50 mg total) by mouth daily as needed for severe pain. 90 tablet 0   No facility-administered medications prior to visit.     Per HPI unless specifically indicated in ROS section below Review of Systems  Musculoskeletal:  Positive for arthralgias, back pain and neck pain.  Skin:  Negative for color change.  Objective:  Ht 5' (1.524 m)   Wt 155 lb (70.3 kg)   BMI 30.27 kg/m   Wt Readings from Last 3 Encounters:  03/24/21 155 lb (70.3 kg)  12/22/20 155 lb (70.3 kg)  04/19/20 149 lb 8 oz (67.8 kg)       Physical exam: Gen: alert, NAD, not ill appearing Pulm: speaks in complete sentences without increased work of breathing Psych: normal mood, normal thought content      Results for orders placed or performed in visit on 02/04/21  TSH  Result Value Ref Range   TSH 3.97 0.35 - 5.50 uIU/mL   Assessment & Plan:   Problem List Items Addressed This Visit       Musculoskeletal and Integument   Primary osteoarthritis of both hands    Agree that patient would benefit from a  sleeper lift chair given her extensive osteoarthritis to multiple joints.  Order placed for DME. She will notify us once she finds a store with a chair she prefers.      Relevant Orders   For home use only DME Other see comment   Primary osteoarthritis of both knees - Primary    Agree that patient would benefit from a sleeper lift chair given her extensive osteoarthritis to multiple joints.  Order placed for DME. She will notify us once she finds a store with a chair she prefers.      Relevant Orders   For home use only DME Other see comment   Primary osteoarthritis of both feet    Agree that patient would benefit from a sleeper lift chair given her extensive osteoarthritis to multiple joints.  Order placed for DME. She will notify us once she finds a store with a chair she prefers.      Relevant Orders   For home use only DME Other see comment     No orders of the defined types were placed in this encounter.  Orders Placed This Encounter  Procedures   For home use only DME Other see comment    Sleeper chair lift. Prefers [employer] brand, is open to other brands if insurance will not cover [employer]. Dx: Osteoarthritis    Order Specific Question:   Length of Need    Answer:   Lifetime    I discussed the assessment and treatment plan with the patient. The patient was provided an opportunity to ask questions and all were answered. The patient agreed with the plan and demonstrated an understanding of the instructions. The patient was advised to call back or seek an in-person evaluation if the symptoms worsen or if the condition fails to improve as anticipated.  Follow up plan:  Please notify me when you have found a home health supply store as discussed.  It was a pleasure to see you today!   [name], NP

## 2021-03-24 NOTE — Patient Instructions (Signed)
Please notify me when you have found a home health supply store as discussed.  It was a pleasure to see you today!

## 2021-03-30 ENCOUNTER — Telehealth: Payer: Self-pay | Admitting: Primary Care

## 2021-03-30 NOTE — Telephone Encounter (Signed)
Have called and received information from Mr. Kathleen Paul. I have called [employer] and received form per his instructions will call patient. When I call the company they will only give receipt to patient to get reimbursed will not file for her. Need to call patient and see if there is somewhere that she can get from supply store.

## 2021-03-30 NOTE — Telephone Encounter (Signed)
[employer] called to request a prescription for a sleep chair

## 2021-03-31 ENCOUNTER — Telehealth: Payer: Self-pay | Admitting: Primary Care

## 2021-03-31 NOTE — Telephone Encounter (Signed)
Pt called and would like for you to call her

## 2021-03-31 NOTE — Telephone Encounter (Signed)
Second message about this please see other other message for documentation.

## 2021-04-05 ENCOUNTER — Telehealth: Payer: Self-pay | Admitting: Primary Care

## 2021-04-05 NOTE — Chronic Care Management (AMB) (Signed)
  Chronic Care Management   Note  04/05/2021 Name: MARCHIA DIGUGLIELMO MRN: [id number] DOB: 1935/09/02  [name] is a 85 y.o. year old female who is a primary care patient of [name], NP. I reached out to [name] by phone today in response to a referral sent by Ms. Marland Mcalpine PCP, [name], NP.   Ms. Saintil was given information about Chronic Care Management services today including:  CCM service includes personalized support from designated clinical staff supervised by her physician, including individualized plan of care and coordination with other care providers 24/7 contact phone numbers for assistance for urgent and routine care needs. Service will only be billed when office clinical staff spend 20 minutes or more in a month to coordinate care. Only one practitioner may furnish and bill the service in a calendar month. The patient may stop CCM services at any time (effective at the end of the month) by phone call to the office staff.   Patient agreed to services and verbal consent obtained.   Follow up plan:   Tatjana Secretary/administrator

## 2021-04-08 NOTE — Telephone Encounter (Signed)
Let me know when you get a chance to do order will send

## 2021-04-08 NOTE — Telephone Encounter (Signed)
It looks like we've already been working on this. See phone notes from 03/30/21. What do we need to do?

## 2021-04-13 NOTE — Telephone Encounter (Signed)
See my chart. Addressed with patient. No further action taken on this call.

## 2021-04-13 NOTE — Telephone Encounter (Signed)
Can you please take a look at this???

## 2021-04-20 ENCOUNTER — Telehealth: Payer: Self-pay | Admitting: Primary Care

## 2021-04-20 NOTE — Telephone Encounter (Signed)
Called office spoke to [employer], they are not able to process the claim with out code. I need to reach out to patient and see if she would be willing to use DME provider that does contract with insurance. Would like call back from me to see how patient would like to proceed.

## 2021-04-20 NOTE — Telephone Encounter (Signed)
Kathleen Paul from [employer] called in stating she need a code for the request for the lift chair

## 2021-04-22 NOTE — Telephone Encounter (Signed)
[employer] from [employer] [facility] is returning call [phone] ASAP

## 2021-04-22 NOTE — Telephone Encounter (Signed)
Called and spoke to patient. She will have her son call around and see if any medical supply stores will be able to help with ordering through insurance. She will give me a call back next week with that information. I have called insurance back and requested withdraw of request until we get all information from patient.

## 2021-05-05 DIAGNOSIS — R69 Illness, unspecified: Secondary | ICD-10-CM | POA: Diagnosis not present

## 2021-05-06 DIAGNOSIS — Z79899 Other long term (current) drug therapy: Secondary | ICD-10-CM | POA: Diagnosis not present

## 2021-05-06 DIAGNOSIS — M05741 Rheumatoid arthritis with rheumatoid factor of right hand without organ or systems involvement: Secondary | ICD-10-CM | POA: Diagnosis not present

## 2021-05-25 ENCOUNTER — Telehealth: Payer: Self-pay

## 2021-05-25 NOTE — Progress Notes (Signed)
Chronic Care Management Pharmacy Assistant   Name: JEWELL HAUGHT  MRN: [id number] DOB: [birth year]  Reason for Encounter: CCM (Initial Questions)   Recent office visits:  03/24/2021 - Alma Friendly, NP - Video Visit - Follow up due to primary osteoarthritis of both knees. Order placed for sleeper lift chair.  12/22/2020 - Alma Friendly, NP - Patient presented for Annual Wellness Visit. Labs: CBC, CMP, Lipid and TSH. Change: traMADol (ULTRAM) 50 MG tablet. Stop due to completeomeprazole (PRILOSEC) 40 MG capsule.d course:  12/13/2020 - Andrez Grime, LPN - Patient presented for medicare wellness. No medication changes.   Recent consult visits:  None in last six months.   Hospital visits:  None in previous 6 months  Medications: Outpatient Encounter Medications as of 05/25/2021  Medication Sig   acetaminophen (TYLENOL) 500 MG tablet Take 2 tablets (1,000 mg total) by mouth every 8 (eight) hours.   Calcium-Magnesium-Vitamin D (CALCIUM 1200+D3 PO) Take 1 tablet by mouth daily.   enalapril-hydrochlorothiazide (VASERETIC) 10-25 MG tablet Take 1 tablet by mouth daily. For blood pressure.   levothyroxine (SYNTHROID) 88 MCG tablet TAKE 1 TABLET BY MOUTH EVERY MORNING ON AN EMPTY STOMACH WITH WATER ONLY. NO FOOD OR OTHER MEDS FOR 30 MINUTES   metoprolol tartrate (LOPRESSOR) 25 MG tablet TAKE 1 TABLET BY MOUTH EVERY EVENING for blood pressure.   Multiple Vitamin (MULTIVITAMIN PO) Take 1 tablet by mouth daily.   OVER THE COUNTER MEDICATION Apply 1 application topically 2 (two) times daily as needed (for pain). OxyRub Cream   simvastatin (ZOCOR) 20 MG tablet TAKE 1 TABLET(20 MG) BY MOUTH EVERY EVENING FOR CHOLESTEROL   traMADol (ULTRAM) 50 MG tablet Take 1 tablet (50 mg total) by mouth daily as needed for severe pain.   No facility-administered encounter medications on file as of 05/25/2021.   Lab Results  Component Value Date/Time   HGBA1C 5.5 08/18/2019 10:39 AM   HGBA1C 5.7  01/28/2019 08:35 AM    BP Readings from Last 3 Encounters:  12/22/20 126/68  04/19/20 126/70  11/19/19 120/72   Patient contacted to review initial questions prior to visit with [name].  Have you seen any other providers since your last visit with PCP? No  Any changes in your medications or health? No  Any side effects from any medications? No  Do you have an symptoms or problems not managed by your medications? No  Any concerns about your health right now? No  Has your provider asked that you check blood pressure, blood sugar, or follow special diet at home? Patient takes her blood pressure infrequently at home. Patient agreed to take her BP every day from today until 11/23 when her CCM call is. Patient will report log to [facility].  Do you get any type of exercise on a regular basis? No Patient has had three surgeries on her knee and she uses a walker. She walks as much as she can.   Can you think of a goal you would like to reach for your health? Yes Patient wants to keep being able to take care of herself.   Do you have any problems getting your medications? No Patient stated she needs a refill of her Simvastatin 20 mg tablets.   Is there anything that you would like to discuss during the appointment? No  Spoke with patient and reminded them to have all medications, supplements and any blood glucose and blood pressure readings available for review with pharmacist, at their telephone visit on  06/01/2021 at 11:00.   Star Rating Drugs:  Medication:  Last Fill: Day Supply Simvastatin 20 mg 05/06/2021 90   Care Gaps: Annual wellness visit in last year? Yes 12/22/2020 Most Recent BP reading: 126/68  [name], CPP notified  [name], [place] [phone]  Time Spent: 30 Minutes

## 2021-06-01 ENCOUNTER — Ambulatory Visit (INDEPENDENT_AMBULATORY_CARE_PROVIDER_SITE_OTHER): Payer: PPO | Admitting: Pharmacist

## 2021-06-01 ENCOUNTER — Other Ambulatory Visit: Payer: Self-pay

## 2021-06-01 DIAGNOSIS — N1832 Chronic kidney disease, stage 3b: Secondary | ICD-10-CM

## 2021-06-01 DIAGNOSIS — M170 Bilateral primary osteoarthritis of knee: Secondary | ICD-10-CM

## 2021-06-01 DIAGNOSIS — I2510 Atherosclerotic heart disease of native coronary artery without angina pectoris: Secondary | ICD-10-CM

## 2021-06-01 DIAGNOSIS — E785 Hyperlipidemia, unspecified: Secondary | ICD-10-CM

## 2021-06-01 DIAGNOSIS — I10 Essential (primary) hypertension: Secondary | ICD-10-CM

## 2021-06-01 NOTE — Progress Notes (Signed)
Chronic Care Management Pharmacy Note  06/01/2021 Name:  Kathleen Paul MRN:  [id number] DOB:  25-Sep-1935  Summary: -Pt reports compliance with medications as prescribed -Her main issue is osteoarthritis (shoulders, knees); she takes Tylenol and tramadol PRN; she declines further visits with specialists (ie, orthopedics or rheumatology) and would prefer to deal with pain as is -Calculated FRAX as patient has not had a DEXA scan: 18% risk for major osteoporotic fracture and 6.8% risk for hip fracture (high risk for hip fracture)  Recommendations/Changes made from today's visit: -No med changes -Advised pt she can increase Tylenol up to 3000 mg/day if needed -Recommended DEXA scan, however pt declined  Plan: -Kimberly will call patient in 3 mos for BP monitoring -Pharmacist follow up televisit scheduled for 6 months   Subjective: Kathleen Paul is an 85 y.o. year old female who is a primary patient of [name], NP.  The CCM team was consulted for assistance with disease management and care coordination needs.    Engaged with patient by telephone for initial visit in response to provider referral for pharmacy case management and/or care coordination services.   Consent to Services:  The patient was given the following information about Chronic Care Management services today, agreed to services, and gave verbal consent: 1. CCM service includes personalized support from designated clinical staff supervised by the primary care provider, including individualized plan of care and coordination with other care providers 2. 24/7 contact phone numbers for assistance for urgent and routine care needs. 3. Service will only be billed when office clinical staff spend 20 minutes or more in a month to coordinate care. 4. Only one practitioner may furnish and bill the service in a calendar month. 5.The patient may stop CCM services at any time (effective at the end of the month) by  phone call to the office staff. 6. The patient will be responsible for cost sharing (co-pay) of up to 20% of the service fee (after annual deductible is met). Patient agreed to services and consent obtained.  Patient Care Team: [name], NP as PCP - General (Internal Medicine) Fay Records, MD as PCP - Cardiology (Cardiology) Charlton Haws, [facility] as Pharmacist (Pharmacist)  Patient lives alone, her daughter lives a few blocks away - she stays a few nights a week. 4 children total, 8 grandchildren, 3 great grandchildren. She uses a walker to get around. 85  Recent office visits: 03/24/2021 - Kathleen Friendly, NP - Video Visit - Follow up due to primary osteoarthritis of both knees. Order placed for sleeper lift chair.   12/22/2020 - Kathleen Friendly, NP - Patient presented for Annual Wellness Visit. Labs: CBC, CMP, Lipid and TSH. Change: traMADol from BID to daily PRN. Stop due to complete: omeprazole. Repeat TSH 6 weeks due to possible overtreatment.  12/13/2020 Andrez Grime, LPN - Patient presented for medicare wellness. No medication changes.   Recent consult visits: [facility] visits: None in previous 6 months   Objective:  Lab Results  Component Value Date   CREATININE 1.42 (H) 12/22/2020   BUN 80 (H) 12/22/2020   GFR 34.30 (L) 12/22/2020   GFRNONAA 49 (L) 07/02/2019   GFRAA 56 (L) 07/02/2019   NA 138 12/22/2020   K 4.8 12/22/2020   CALCIUM 9.5 12/22/2020   CO2 26 12/22/2020   GLUCOSE 92 12/22/2020    Lab Results  Component Value Date/Time   HGBA1C 5.5 08/18/2019 10:39 AM   HGBA1C 5.7 01/28/2019  08:35 AM   GFR 34.30 (L) 12/22/2020 09:28 AM   GFR 34.02 (L) 12/30/2019 03:07 PM    Last diabetic Eye exam: No results found for: HMDIABEYEEXA  Last diabetic Foot exam: No results found for: HMDIABFOOTEX   Lab Results  Component Value Date   CHOL 147 12/22/2020   HDL 57.80 12/22/2020   LDLCALC 64 12/22/2020   LDLDIRECT 180.2 07/17/2013   TRIG  129.0 12/22/2020   CHOLHDL 3 12/22/2020    Hepatic Function Latest Ref Rng & Units 12/22/2020 12/30/2019 08/18/2019  Total Protein 6.0 - 8.3 g/dL 7.3 - 6.9  Albumin 3.5 - 5.2 g/dL 4.7 3.9 4.1  AST 0 - 37 U/L 18 - 17  ALT 0 - 35 U/L 9 - 10  Alk Phosphatase 39 - 117 U/L 62 - 61  Total Bilirubin 0.2 - 1.2 mg/dL 0.8 - 0.6    Lab Results  Component Value Date/Time   TSH 3.97 02/04/2021 08:12 AM   TSH 0.37 12/22/2020 09:28 AM   FREET4 1.33 07/17/2013 08:28 AM   FREET4 1.42 01/13/2013 08:21 AM    CBC Latest Ref Rng & Units 12/22/2020 12/30/2019 11/19/2019  WBC 4.0 - 10.5 K/uL 6.8 4.3 5.6  Hemoglobin 12.0 - 15.0 g/dL 11.1(L) 9.8(L) 10.8(L)  Hematocrit 36.0 - 46.0 % 33.4(L) 29.1(L) 32.1(L)  Platelets 150.0 - 400.0 K/uL 189.0 227.0 318.0   Iron/TIBC/Ferritin/ %Sat    Component Value Date/Time   IRON 91 03/26/2018 0750   TIBC 363 11/28/2017 0926   FERRITIN 20 11/28/2017 0926   IRONPCTSAT 24.3 03/26/2018 0750   IRONPCTSAT 30 11/28/2017 0926    No results found for: VD25OH  Clinical ASCVD: Yes  The ASCVD Risk score (Arnett DK, et al., 2019) failed to calculate for the following reasons:   The 2019 ASCVD risk score is only valid for ages 71 to 71    Depression screen PHQ 2/9 12/13/2020 11/05/2019  Decreased Interest 0 0  Down, Depressed, Hopeless 0 0  PHQ - 2 Score 0 0  Altered sleeping 0 0  Tired, decreased energy 0 0  Change in appetite 0 0  Feeling bad or failure about yourself  0 0  Trouble concentrating 0 0  Moving slowly or fidgety/restless 0 0  Suicidal thoughts 0 0  PHQ-9 Score 0 0  Difficult doing work/chores Not difficult at all Not difficult at all    Social History   Tobacco Use  Smoking Status Former   Packs/day: 0.50   Years: 30.00   Pack years: 15.00   Types: Cigarettes   Quit date: 07/10/1988   Years since quitting: 32.9  Smokeless Tobacco Never   BP Readings from Last 3 Encounters:  12/22/20 126/68  04/19/20 126/70  11/19/19 120/72   Pulse Readings  from Last 3 Encounters:  12/22/20 76  04/19/20 (!) 52  11/19/19 72   Wt Readings from Last 3 Encounters:  03/24/21 155 lb (70.3 kg)  12/22/20 155 lb (70.3 kg)  04/19/20 149 lb 8 oz (67.8 kg)   BMI Readings from Last 3 Encounters:  03/24/21 30.27 kg/m  12/22/20 30.27 kg/m  04/19/20 29.20 kg/m    Assessment/Interventions: Review of patient past medical history, allergies, medications, health status, including review of consultants reports, laboratory and other test data, was performed as part of comprehensive evaluation and provision of chronic care management services.   SDOH:  (Social Determinants of Health) assessments and interventions performed: Yes  SDOH Screenings   Alcohol Screen: Low Risk    Last Alcohol  Screening Score (AUDIT): 0  Depression (PHQ2-9): Low Risk    PHQ-2 Score: 0  Financial Resource Strain: Low Risk    Difficulty of Paying Living Expenses: Not hard at all  Food Insecurity: No Food Insecurity   Worried About Charity fundraiser in the Last Year: Never true   Ran Out of Food in the Last Year: Never true  Housing: Low Risk    Last Housing Risk Score: 0  Physical Activity: Inactive   Days of Exercise per Week: 0 days   Minutes of Exercise per Session: 0 min  Social Connections: Not on file  Stress: No Stress Concern Present   Feeling of Stress : Not at all  Tobacco Use: Medium Risk   Smoking Tobacco Use: Former   Smokeless Tobacco Use: Never   Passive Exposure: Not on file  Transportation Needs: No Transportation Needs   Lack of Transportation (Medical): No   Lack of Transportation (Non-Medical): No    CCM Care Plan  Allergies  Allergen Reactions   Codeine Nausea Only    Medications Reviewed Today     Reviewed by Charlton Haws, [facility] (Pharmacist) on 06/01/21 at 1549  Med List Status: <None>   Medication Order Taking? Sig Documenting Provider Last Dose Status Informant  acetaminophen (TYLENOL) 500 MG tablet [id number] Yes Take 2  tablets (1,000 mg total) by mouth every 8 (eight) hours. Danae Orleans, PA-C Taking Active   aspirin EC 81 MG tablet [id number] Yes Take 81 mg by mouth daily. Swallow whole. [provider] Taking Active   Calcium-Magnesium-Vitamin D (CALCIUM 1200+D3 PO) [id number] Yes Take 1 tablet by mouth daily. [provider] Taking Active Self  enalapril-hydrochlorothiazide (VASERETIC) 10-25 MG tablet [id number] Yes Take 1 tablet by mouth daily. For blood pressure. [name], NP Taking Active   levothyroxine (SYNTHROID) 88 MCG tablet [id number] Yes TAKE 1 TABLET BY MOUTH EVERY MORNING ON AN EMPTY STOMACH WITH WATER ONLY. NO FOOD OR OTHER MEDS FOR 30 MINUTES [name], NP Taking Active   metoprolol tartrate (LOPRESSOR) 25 MG tablet [id number] Yes TAKE 1 TABLET BY MOUTH EVERY EVENING for blood pressure. [name], NP Taking Active   Multiple Vitamin (MULTIVITAMIN PO) [id number] Yes Take 1 tablet by mouth daily. [provider] Taking Active Self           Med Note Bridgette Habermann   Fri May 30, 2019  3:48 PM)    simvastatin (ZOCOR) 20 MG tablet [id number] Yes TAKE 1 TABLET(20 MG) BY MOUTH EVERY EVENING FOR CHOLESTEROL [name], NP Taking Active   traMADol (ULTRAM) 50 MG tablet [id number] Yes Take 1 tablet (50 mg total) by mouth daily as needed for severe pain. [name], NP Taking Active             Patient Active Problem List   Diagnosis Date Noted   Preventative health care 11/19/2019   Patellar tendon rupture, left, subsequent encounter 07/01/2019   Overweight (BMI 25.0-29.9) 06/04/2019   Rupture patellar tendon, left, subsequent encounter 06/03/2019   Status post total left knee replacement 02/11/2019   Food impaction of esophagus 04/02/2018   Primary osteoarthritis of both hands 12/24/2017   Primary osteoarthritis of both knees 12/24/2017   Primary osteoarthritis of both feet 12/24/2017   Osteoarthritis of both shoulders  12/24/2017   Gastroesophageal reflux disease 08/30/2017   GAD (generalized anxiety disorder) 09/05/2016   Renal insufficiency 07/17/2013   Suprapubic mass 08/20/2012   Hypothyroidism  UI (urinary incontinence)    Stricture and stenosis of esophagus 04/11/2011   Chest pain 03/29/2011   Dyslipidemia 03/29/2011   Hypertension 03/29/2011   CAD (coronary artery disease) 03/29/2011   Palpitations 03/29/2011    Immunization History  Administered Date(s) Administered   Influenza Split 04/02/2012   Influenza, High Dose Seasonal PF 04/16/2017   Influenza,inj,Quad PF,6+ Mos 04/29/2013, 04/18/2018, 04/29/2019, 04/19/2020   Influenza-Unspecified 04/30/2016   PFIZER(Purple Top)SARS-COV-2 Vaccination 09/24/2019, 10/15/2019   Pneumococcal Conjugate-13 04/29/2019   Pneumococcal Polysaccharide-23 04/18/2018    Conditions to be addressed/monitored:  Hypertension, Hyperlipidemia, Coronary Artery Disease, Chronic Kidney Disease, Hypothyroidism, and Osteoarthritis  Care Plan : [place]  Updates made by Charlton Haws, [place] since 06/01/2021 12:00 AM     Problem: Hypertension, Hyperlipidemia, Coronary Artery Disease, Chronic Kidney Disease, Hypothyroidism, and Osteoarthritis   Priority: High     Long-Range Goal: Disease mgmt   Start Date: 06/01/2021  Expected End Date: 06/01/2022  This Visit's Progress: On track  Priority: High  Note:   Current Barriers:  Unable to independently monitor therapeutic efficacy  Pharmacist Clinical Goal(s):  Patient will achieve adherence to monitoring guidelines and medication adherence to achieve therapeutic efficacy through collaboration with PharmD and provider.   Interventions: 1:1 collaboration with [name], NP regarding development and update of comprehensive plan of care as evidenced by provider attestation and co-signature Inter-disciplinary care team collaboration (see longitudinal plan of care) Comprehensive  medication review performed; medication list updated in electronic medical record  Hypertension / CKD stage 3b(BP goal <130/80) -Controlled - BP is at goal; pt endorses compliance with medications -follows with nephrology -Current home readings: 123/75, 67 -Current treatment: Enalapril-HCTZ 10-25 mg daily AM Metoprolol tartrate 25 mg daily PM -Denies hypotensive/hypertensive symptoms -Educated on BP goals and benefits of medications for prevention of heart attack, stroke and kidney damage; Daily salt intake goal < 2300 mg; Importance of home blood pressure monitoring; -Counseled to monitor BP at home daily -Recommended to continue current medication  Hyperlipidemia / CAD: (LDL goal < 70) -Controlled - LDL is at goal; pt endorses compliance with statin and aspirin; she doe not endorse bleeding issues -Current treatment: Simvastatin 20 mg daily HS Aspirin 81 mg daily -Educated on Cholesterol goals; Benefits of statin for ASCVD risk reduction; -Recommended to continue current medication  Osteoarthritis (Goal: manage pain) -Not ideally controlled - pt reports shoulder and knee pain is debilitating at times; she does not take pain medication every day (either tylenol or tramadol); she reports she has seen ortho previously, had injections but these did not help for long; she has been told not to take NSAIDS due to kidney disease -OA of both knees, shoulders, hands, feet -Hx 3 knee surgeries. Has seen rheumatology previously -Current treatment  Tylenol 500 mg PRN -2 tab at a time; not every day Tramadol 50 mg daily PRN -Medications previously tried: voltaren, aspercreme  -Counseled on max daily tylenol dose 3000 mg/day; advised she can use more tylenol than she has been using if needed -Recommended to continue current medication  Hypothyroidism (Goal: maintain TSH in goal range) -Controlled - pt reports taking levothyroxine first thing in AM and waiting an hour before eating -Current  treatment  Levothyroxine 88 mcg daily AM -Recommended to continue current medication  Health Maintenance -Vaccine gaps: Shingrix -Pt has previously deferred DEXA scan; FRAX is 18% for major osteoporotic fracture and 6.8% for hip fracture meaning she is high risk for hip fracture; counseled on purpose of scan and  importance of bone health to prevent fractures; pt declined DEXA scan again today -Current therapy:  Multivitamin Calcium-Mg-Vitamin D BID -Patient is satisfied with current therapy and denies issues -Recommended to continue current medication  Patient Goals/Self-Care Activities Patient will:  - take medications as prescribed as evidenced by patient report and record review focus on medication adherence by pill box check blood pressure daily, document, and provide at future appointments       Medication Assistance: None required.  Patient affirms current coverage meets needs.  Compliance/Adherence/Medication fill history: Care Gaps: None  Star-Rating Drugs: Enalapril-HCTZ - LF 02/10/21 x 90 ds, PDC 92% Simvastatin - LF 02/10/21 x 90 ds, PDC 92%  Patient's preferred pharmacy is:  [id number] - [place], [place] AT [place] [address] [place] [address] Phone: [phone] Fax: [phone]  Uses pill box? Yes Pt endorses 100% compliance  We discussed: Current pharmacy is preferred with insurance plan and patient is satisfied with pharmacy services Patient decided to: Continue current medication management strategy  Care Plan and Follow Up Patient Decision:  Patient agrees to Care Plan and Follow-up.  Plan: Telephone follow up appointment with care management team member scheduled for:  6 months  [name], PharmD, BCACP Clinical Pharmacist [facility] at [facility] [phone]

## 2021-06-01 NOTE — Patient Instructions (Signed)
Visit Information  Phone number for Pharmacist: [phone]  Thank you for meeting with me to discuss your medications! I look forward to working with you to achieve your health care goals. Below is a summary of what we talked about during the visit:   Goals Addressed             This Visit's Progress    Prevent Falls and Broken Bones-Osteoporosis       Timeframe:  Long-Range Goal Priority:  High Start Date:         06/01/21                    Expected End Date:     06/01/22                  Follow Up Date May 2023   - always use handrails on the stairs - always wear shoes or slippers with non-slip sole - get at least 10 minutes of activity every day - keep cell phone with me always - make an emergency alert plan in case I fall - pick up clutter from the floors - use a cane or walker - use a nightlight in the bathroom    Why is this important?   When you fall, there are 3 things that control if a bone breaks or not.  These are the fall itself, how hard and the direction that you fall and how fragile your bones are.  Preventing falls is very important for you because of fragile bones.     Notes:         Care Plan : [place]  Updates made by Charlton Haws, RPH since 06/01/2021 12:00 AM     Problem: Hypertension, Hyperlipidemia, Coronary Artery Disease, Chronic Kidney Disease, Hypothyroidism, and Osteoarthritis   Priority: High     Long-Range Goal: Disease mgmt   Start Date: 06/01/2021  Expected End Date: 06/01/2022  This Visit's Progress: On track  Priority: High  Note:   Current Barriers:  Unable to independently monitor therapeutic efficacy  Pharmacist Clinical Goal(s):  Patient will achieve adherence to monitoring guidelines and medication adherence to achieve therapeutic efficacy through collaboration with PharmD and provider.   Interventions: 1:1 collaboration with [name], NP regarding development and update of  comprehensive plan of care as evidenced by provider attestation and co-signature Inter-disciplinary care team collaboration (see longitudinal plan of care) Comprehensive medication review performed; medication list updated in electronic medical record  Hypertension / CKD stage 3b(BP goal <130/80) -Controlled - BP is at goal; pt endorses compliance with medications -follows with nephrology -Current home readings: 123/75, 67 -Current treatment: Enalapril-HCTZ 10-25 mg daily AM Metoprolol tartrate 25 mg daily PM -Denies hypotensive/hypertensive symptoms -Educated on BP goals and benefits of medications for prevention of heart attack, stroke and kidney damage; Daily salt intake goal < 2300 mg; Importance of home blood pressure monitoring; -Counseled to monitor BP at home daily -Recommended to continue current medication  Hyperlipidemia / CAD: (LDL goal < 70) -Controlled - LDL is at goal; pt endorses compliance with statin and aspirin; she doe not endorse bleeding issues -Current treatment: Simvastatin 20 mg daily HS Aspirin 81 mg daily -Educated on Cholesterol goals; Benefits of statin for ASCVD risk reduction; -Recommended to continue current medication  Osteoarthritis (Goal: manage pain) -Not ideally controlled - pt reports shoulder and knee pain is debilitating at times; she does not take pain medication every day (either tylenol or tramadol); she  reports she has seen ortho previously, had injections but these did not help for long; she has been told not to take NSAIDS due to kidney disease -OA of both knees, shoulders, hands, feet -Hx 3 knee surgeries. Has seen rheumatology previously -Current treatment  Tylenol 500 mg PRN -2 tab at a time; not every day Tramadol 50 mg daily PRN -Medications previously tried: voltaren, aspercreme  -Counseled on max daily tylenol dose 3000 mg/day; advised she can use more tylenol than she has been using if needed -Recommended to continue current  medication  Hypothyroidism (Goal: maintain TSH in goal range) -Controlled - pt reports taking levothyroxine first thing in AM and waiting an hour before eating -Current treatment  Levothyroxine 88 mcg daily AM -Recommended to continue current medication  Health Maintenance -Vaccine gaps: Shingrix -Pt has previously deferred DEXA scan; FRAX is 18% for major osteoporotic fracture and 6.8% for hip fracture meaning she is high risk for hip fracture; counseled on purpose of scan and importance of bone health to prevent fractures; pt declined DEXA scan again today -Current therapy:  Multivitamin Calcium-Mg-Vitamin D BID -Patient is satisfied with current therapy and denies issues -Recommended to continue current medication  Patient Goals/Self-Care Activities Patient will:  - take medications as prescribed as evidenced by patient report and record review focus on medication adherence by pill box check blood pressure daily, document, and provide at future appointments       Ms. Mcgough was given information about Chronic Care Management services today including:  CCM service includes personalized support from designated clinical staff supervised by her physician, including individualized plan of care and coordination with other care providers 24/7 contact phone numbers for assistance for urgent and routine care needs. Standard insurance, coinsurance, copays and deductibles apply for chronic care management only during months in which we provide at least 20 minutes of these services. Most insurances cover these services at 100%, however patients may be responsible for any copay, coinsurance and/or deductible if applicable. This service may help you avoid the need for more expensive face-to-face services. Only one practitioner may furnish and bill the service in a calendar month. The patient may stop CCM services at any time (effective at the end of the month) by phone call to the office  staff.  Patient agreed to services and verbal consent obtained.   Patient verbalizes understanding of instructions provided today and agrees to view in [place].  Telephone follow up appointment with pharmacy team member scheduled for: 6 months  [name], PharmD, [facility] Clinical Pharmacist [facility] at [facility] [phone]

## 2021-06-08 DIAGNOSIS — Z87891 Personal history of nicotine dependence: Secondary | ICD-10-CM | POA: Diagnosis not present

## 2021-06-08 DIAGNOSIS — I2510 Atherosclerotic heart disease of native coronary artery without angina pectoris: Secondary | ICD-10-CM

## 2021-06-08 DIAGNOSIS — N1832 Chronic kidney disease, stage 3b: Secondary | ICD-10-CM | POA: Diagnosis not present

## 2021-06-08 DIAGNOSIS — E785 Hyperlipidemia, unspecified: Secondary | ICD-10-CM

## 2021-06-08 DIAGNOSIS — M170 Bilateral primary osteoarthritis of knee: Secondary | ICD-10-CM

## 2021-06-08 DIAGNOSIS — I129 Hypertensive chronic kidney disease with stage 1 through stage 4 chronic kidney disease, or unspecified chronic kidney disease: Secondary | ICD-10-CM

## 2021-06-30 ENCOUNTER — Telehealth (INDEPENDENT_AMBULATORY_CARE_PROVIDER_SITE_OTHER): Payer: PPO | Admitting: Primary Care

## 2021-06-30 ENCOUNTER — Telehealth: Payer: PPO | Admitting: Physician Assistant

## 2021-06-30 ENCOUNTER — Encounter: Payer: Self-pay | Admitting: Primary Care

## 2021-06-30 DIAGNOSIS — R051 Acute cough: Secondary | ICD-10-CM | POA: Insufficient documentation

## 2021-06-30 DIAGNOSIS — J069 Acute upper respiratory infection, unspecified: Secondary | ICD-10-CM

## 2021-06-30 MED ORDER — BENZONATATE 100 MG PO CAPS: 100.0000 mg | ORAL_CAPSULE | Freq: Three times a day (TID) | ORAL | 0 refills | Status: DC | PRN

## 2021-06-30 MED ORDER — IPRATROPIUM BROMIDE 0.03 % NA SOLN: 2.0000 | Freq: Two times a day (BID) | NASAL | 0 refills | Status: DC

## 2021-06-30 NOTE — Progress Notes (Signed)

## 2021-06-30 NOTE — Progress Notes (Signed)
Patient ID: Kathleen Paul, female    DOB: 09/21/1935, 85 y.o.   MRN: [id number]  Virtual visit completed through [place], a video enabled telemedicine application. Due to national recommendations of social distancing due to COVID-19, a virtual visit is felt to be most appropriate for this patient at this time. Reviewed limitations, risks, security and privacy concerns of performing a virtual visit and the availability of in person appointments. I also reviewed that there may be a patient responsible charge related to this service. The patient agreed to proceed.   Patient location: home Provider location: [place] at [facility], office Persons participating in this virtual visit: patient, provider, patient's daughter  If any vitals were documented, they were collected by patient at home unless specified below.    BP 120/80    Pulse (!) 57    Temp 98.4 F (36.9 C) (Oral)    Ht 5' (1.524 m)    Wt 155 lb (70.3 kg)    BMI 30.27 kg/m    CC: Cough Subjective:   HPI: Kathleen Paul is a 85 y.o. female with a history of hypertension, CAD, GERD, hypothyroidism, renal insufficiency presenting on 06/30/2021 to discuss cough.  Symptom onset two days ago with chest tightness and cough. She's then developed sore throat. Her cough is non productive. She denies fevers, body aches, chills.  Her daughter has similar symptoms, began two days ago, is feeling better.   She's been taking Coricidin, throat lozenge, cough drops, gargling with salt water.   She took a Covid-19 test today which was negative.   She completed an e-visit just prior to this one, diagnosed with viral URI and was prescribed Atrovent nasal spray and Tessalon Perles 100 mg. She thought she was setting up a visit with [place], but accidentally saw someone else through the [facility] system.       Relevant past medical, surgical, family and social history reviewed and updated as indicated. Interim medical history since our last  visit reviewed. Allergies and medications reviewed and updated. Outpatient Medications Prior to Visit  Medication Sig Dispense Refill   acetaminophen (TYLENOL) 500 MG tablet Take 2 tablets (1,000 mg total) by mouth every 8 (eight) hours. 30 tablet 0   aspirin EC 81 MG tablet Take 81 mg by mouth daily. Swallow whole.     Calcium-Magnesium-Vitamin D (CALCIUM 1200+D3 PO) Take 1 tablet by mouth daily.     enalapril-hydrochlorothiazide (VASERETIC) 10-25 MG tablet Take 1 tablet by mouth daily. For blood pressure. 90 tablet 2   ipratropium (ATROVENT) 0.03 % nasal spray Place 2 sprays into both nostrils every 12 (twelve) hours. 30 mL 0   levothyroxine (SYNTHROID) 88 MCG tablet TAKE 1 TABLET BY MOUTH EVERY MORNING ON AN EMPTY STOMACH WITH WATER ONLY. NO FOOD OR OTHER MEDS FOR 30 MINUTES 90 tablet 2   metoprolol tartrate (LOPRESSOR) 25 MG tablet TAKE 1 TABLET BY MOUTH EVERY EVENING for blood pressure. 90 tablet 2   Multiple Vitamin (MULTIVITAMIN PO) Take 1 tablet by mouth daily.     simvastatin (ZOCOR) 20 MG tablet TAKE 1 TABLET(20 MG) BY MOUTH EVERY EVENING FOR CHOLESTEROL 90 tablet 2   traMADol (ULTRAM) 50 MG tablet Take 1 tablet (50 mg total) by mouth daily as needed for severe pain. 90 tablet 0   benzonatate (TESSALON) 100 MG capsule Take 1 capsule (100 mg total) by mouth 3 (three) times daily as needed. (Patient not taking: Reported on 06/30/2021) 30 capsule 0   No facility-administered  medications prior to visit.     Per HPI unless specifically indicated in ROS section below Review of Systems  Constitutional:  Negative for chills, fatigue and fever.  HENT:  Positive for congestion.   Respiratory:  Positive for cough.   Musculoskeletal:  Negative for myalgias.  Objective:  BP 120/80    Pulse (!) 57    Temp 98.4 F (36.9 C) (Oral)    Ht 5' (1.524 m)    Wt 155 lb (70.3 kg)    BMI 30.27 kg/m   Wt Readings from Last 3 Encounters:  06/30/21 155 lb (70.3 kg)  03/24/21 155 lb (70.3 kg)  12/22/20  155 lb (70.3 kg)       Physical exam: General: Alert and oriented x 3, no distress, does not appear sickly  Pulmonary: Speaks in complete sentences without increased work of breathing, congested/deep cough noted once during visit.  Psychiatric: Normal mood, thought content, and behavior.     Results for orders placed or performed in visit on 02/04/21  TSH  Result Value Ref Range   TSH 3.97 0.35 - 5.50 uIU/mL   Assessment & Plan:   Problem List Items Addressed This Visit       Other   Acute cough    Symptoms and presentation appear to be viral, especially since her daughter had the same symptoms and is doing better.   She completed an E-Visit just recently, agree with diagnosis and treatment plan. Discussed this today.  I offered for her to obtain a chest xray, she declines.   Discussed home care/conservative treatment. Strict ED precautions provided.         No orders of the defined types were placed in this encounter.  No orders of the defined types were placed in this encounter.   I discussed the assessment and treatment plan with the patient. The patient was provided an opportunity to ask questions and all were answered. The patient agreed with the plan and demonstrated an understanding of the instructions. The patient was advised to call back or seek an in-person evaluation if the symptoms worsen or if the condition fails to improve as anticipated.  Follow up plan:  Pick up the nasal spray and cough pills that were sent to the pharmacy already.  You can try Mucinex every 6-8 hours for chest congestion.   Please call us back if you develop fevers with worsening cough/congestion.   It was a pleasure to see you today!   [name], NP

## 2021-06-30 NOTE — Assessment & Plan Note (Signed)
Symptoms and presentation appear to be viral, especially since her daughter had the same symptoms and is doing better.   She completed an E-Visit just recently, agree with diagnosis and treatment plan. Discussed this today.  I offered for her to obtain a chest xray, she declines.   Discussed home care/conservative treatment. Strict ED precautions provided.

## 2021-06-30 NOTE — Patient Instructions (Signed)
Pick up the nasal spray and cough pills that were sent to the pharmacy already.  You can try Mucinex every 6-8 hours for chest congestion.   Please call us back if you develop fevers with worsening cough/congestion.   It was a pleasure to see you today!

## 2021-07-04 DIAGNOSIS — R051 Acute cough: Secondary | ICD-10-CM

## 2021-07-05 ENCOUNTER — Ambulatory Visit
Admission: RE | Admit: 2021-07-05 | Discharge: 2021-07-05 | Disposition: A | Discharge status: Home / Self Care | Payer: PPO | Source: Ambulatory Visit | Attending: Primary Care | Admitting: Primary Care

## 2021-07-05 ENCOUNTER — Ambulatory Visit
Admission: RE | Admit: 2021-07-05 | Discharge: 2021-07-05 | Disposition: A | Discharge status: Home / Self Care | Payer: PPO | Attending: Primary Care | Admitting: Primary Care

## 2021-07-05 DIAGNOSIS — K449 Diaphragmatic hernia without obstruction or gangrene: Secondary | ICD-10-CM | POA: Diagnosis not present

## 2021-07-05 DIAGNOSIS — R051 Acute cough: Secondary | ICD-10-CM

## 2021-07-05 MED ORDER — PREDNISONE 20 MG PO TABS: ORAL_TABLET | ORAL | 0 refills | Status: DC

## 2021-07-05 MED ORDER — AZITHROMYCIN 250 MG PO TABS: ORAL_TABLET | ORAL | 0 refills | Status: DC

## 2021-07-10 MED ORDER — PREDNISONE 20 MG PO TABS: ORAL_TABLET | ORAL | 0 refills | Status: DC

## 2021-08-16 ENCOUNTER — Telehealth: Payer: Self-pay

## 2021-08-16 NOTE — Progress Notes (Signed)
Chronic Care Management Pharmacy Assistant   Name: Kathleen Paul  MRN: [id number] DOB: 06-04-1936  Reason for Encounter: CCM (Hypertension Disease State)   Recent office visits:  07/04/2021 - Alma Friendly, NP - Patient Message - Start for an additional four days: azithromycin (ZITHROMAX) 250 MG tablet. Start: predniSONE (DELTASONE) 20 MG tablet. Ordered: DG Chest 2 view.  06/30/2021 - Alma Friendly, NP - Video Visit - Patient presented for cough. Recommended: Mucinex every 6-8 hrs for chest congestion.  Recent consult visits:  07/05/2021 - Radiology - Patient presented for DG Xray.  06/30/2021 - Fenton Malling, [place] - Patient presented for cough. Diagnoses: Upper Respiratory Infection. Start: benzonatate (TESSALON) 100 MG capsule. Start: ipratropium (ATROVENT) 0.03 % nasal spray.  Hospital visits:  None in previous 6 months  Medications: Outpatient Encounter Medications as of 08/16/2021  Medication Sig   acetaminophen (TYLENOL) 500 MG tablet Take 2 tablets (1,000 mg total) by mouth every 8 (eight) hours.   aspirin EC 81 MG tablet Take 81 mg by mouth daily. Swallow whole.   azithromycin (ZITHROMAX) 250 MG tablet Take 2 tablets by mouth today, then 1 tablet daily for 4 additional days.   benzonatate (TESSALON) 100 MG capsule Take 1 capsule (100 mg total) by mouth 3 (three) times daily as needed. (Patient not taking: Reported on 06/30/2021)   Calcium-Magnesium-Vitamin D (CALCIUM 1200+D3 PO) Take 1 tablet by mouth daily.   enalapril-hydrochlorothiazide (VASERETIC) 10-25 MG tablet Take 1 tablet by mouth daily. For blood pressure.   ipratropium (ATROVENT) 0.03 % nasal spray Place 2 sprays into both nostrils every 12 (twelve) hours.   levothyroxine (SYNTHROID) 88 MCG tablet TAKE 1 TABLET BY MOUTH EVERY MORNING ON AN EMPTY STOMACH WITH WATER ONLY. NO FOOD OR OTHER MEDS FOR 30 MINUTES   metoprolol tartrate (LOPRESSOR) 25 MG tablet TAKE 1 TABLET BY MOUTH EVERY  EVENING for blood pressure.   Multiple Vitamin (MULTIVITAMIN PO) Take 1 tablet by mouth daily.   predniSONE (DELTASONE) 20 MG tablet Take 2 tablets by mouth once daily for 5 days.   simvastatin (ZOCOR) 20 MG tablet TAKE 1 TABLET(20 MG) BY MOUTH EVERY EVENING FOR CHOLESTEROL   traMADol (ULTRAM) 50 MG tablet Take 1 tablet (50 mg total) by mouth daily as needed for severe pain.   No facility-administered encounter medications on file as of 08/16/2021.   Recent Office Vitals: BP Readings from Last 3 Encounters:  06/30/21 120/80  12/22/20 126/68  04/19/20 126/70   Pulse Readings from Last 3 Encounters:  06/30/21 (!) 57  12/22/20 76  04/19/20 (!) 52    Wt Readings from Last 3 Encounters:  06/30/21 155 lb (70.3 kg)  03/24/21 155 lb (70.3 kg)  12/22/20 155 lb (70.3 kg)    Kidney Function Lab Results  Component Value Date/Time   CREATININE 1.42 (H) 12/22/2020 09:28 AM   CREATININE 1.47 (H) 12/30/2019 03:07 PM   CREATININE 1.28 (H) 01/31/2018 09:37 AM   CREATININE 1.03 08/09/2012 02:40 PM   GFR 34.30 (L) 12/22/2020 09:28 AM   GFRNONAA 49 (L) 07/02/2019 03:57 AM   GFRNONAA 39 (L) 01/31/2018 09:37 AM   GFRAA 56 (L) 07/02/2019 03:57 AM   GFRAA 46 (L) 01/31/2018 09:37 AM   BMP Latest Ref Rng & Units 12/22/2020 12/30/2019 08/18/2019  Glucose 70 - 99 mg/dL 92 76 97  BUN 6 - 23 mg/dL 80(H) 52(H) 45(H)  Creatinine 0.40 - 1.20 mg/dL 1.42(H) 1.47(H) 1.18  BUN/Creat Ratio 6 - 22 (calc) - - -  Sodium 135 - 145 mEq/L 138 136 136  Potassium 3.5 - 5.1 mEq/L 4.8 4.8 4.2  Chloride 96 - 112 mEq/L 100 100 99  CO2 19 - 32 mEq/L 26 27 25   Calcium 8.4 - 10.5 mg/dL 9.5 8.7 9.7   Contacted patient on 08/19/2021 to discuss hypertension disease state  Current antihypertensive regimen:  Enalapril-HCTZ 10-25 mg daily AM Metoprolol tartrate 25 mg daily PM  Patient verbally confirms she is taking the above medications as directed. Yes  How often are you checking your Blood Pressure? weekly  Wrist or arm  cuff: Arm cuff Caffeine intake: 1  cup of coffee in the morning Salt intake: Watches her salt intake Over the counter medications including pseudoephedrine or NSAIDs? Tylenol 500 mg - takes 2 tabs daily.   Any readings above 180/120? No  Date  Blood Pressure 02/08  117/80 02/07  119/70 02/06  120/80  What recent interventions/DTPs have been made by any provider to improve Blood Pressure control since last CPP Visit: No recent interventions.   Any recent hospitalizations or ED visits since last visit with CPP? No  What diet changes have been made to improve Blood Pressure Control?  Patient watches her salt intake.   What exercise is being done to improve your Blood Pressure Control?  Patient uses her walker and walks around the house.  Adherence Review: Is the patient currently on ACE/ARB medication? Yes Does the patient have >5 day gap between last estimated fill dates? No  Star Rating Drugs:  Medication:   Last Fill: Day Supply Simvastatin 20 mg  05/25/2021 90   Enalapril-HCTZ 88 mcg 05/25/2021 90  Care Gaps: Annual wellness visit in last year? Yes 12/13/2020 Most Recent BP reading: 120/80 on 06/30/2021  Upcoming appointments: No appointments scheduled within the next 30 days. 12/14/2021 AWV with PCP 05/31/2022 @ 8:30 am with CCM - Pecola Leisure, CPP notified  [name], [place] Clinical Pharmacy Assistant [phone]  Time Spent: 30 Minutes

## 2021-10-15 ENCOUNTER — Telehealth: Payer: Self-pay

## 2021-10-15 NOTE — Telephone Encounter (Signed)
Patient called to make follow up we were having a hard time making due to needing transportation. She provided her Daughters number for [place] to call to get appointment. She is patients transportation. I have called number to get set up. Left voicemail to call office. If she calls please make follow up for patient.  ? Mardene Celeste [phone] ?

## 2021-10-19 NOTE — Telephone Encounter (Signed)
Left message to return call to our office.  ?Number should be [phone] ?

## 2021-10-26 NOTE — Telephone Encounter (Signed)
She will need an in person visit in June 2023 for continued refills. ? ?Please send letter stating that we are trying to contact her to set her up for for transportation. ?

## 2021-10-26 NOTE — Telephone Encounter (Signed)
Called daughter x 3 no call back patient will not make appointment due to transportation.  ?

## 2021-11-02 NOTE — Telephone Encounter (Signed)
Spoke to patient and was advised that her daughter can bring her for an appointment any Tuesday. Patient scheduled for 12/13/21 at 8:40 am. Patient stated that she will let her daughter know that she has this appointment. Patient stated if for some reason this will not work she will have her daughter call back and change to a date that works for her.  ?

## 2021-11-17 ENCOUNTER — Other Ambulatory Visit: Payer: Self-pay | Admitting: Primary Care

## 2021-11-17 DIAGNOSIS — I10 Essential (primary) hypertension: Secondary | ICD-10-CM

## 2021-11-17 DIAGNOSIS — E785 Hyperlipidemia, unspecified: Secondary | ICD-10-CM

## 2021-11-17 DIAGNOSIS — E039 Hypothyroidism, unspecified: Secondary | ICD-10-CM

## 2021-11-28 ENCOUNTER — Telehealth: Payer: PPO

## 2021-12-13 ENCOUNTER — Encounter: Payer: Self-pay | Admitting: Primary Care

## 2021-12-13 ENCOUNTER — Ambulatory Visit (INDEPENDENT_AMBULATORY_CARE_PROVIDER_SITE_OTHER): Payer: PPO | Admitting: Primary Care

## 2021-12-13 VITALS — BP 140/60 | HR 48 | Temp 98.6°F | Ht 60.0 in | Wt 157.0 lb

## 2021-12-13 DIAGNOSIS — M19042 Primary osteoarthritis, left hand: Secondary | ICD-10-CM | POA: Diagnosis not present

## 2021-12-13 DIAGNOSIS — F411 Generalized anxiety disorder: Secondary | ICD-10-CM | POA: Diagnosis not present

## 2021-12-13 DIAGNOSIS — M170 Bilateral primary osteoarthritis of knee: Secondary | ICD-10-CM

## 2021-12-13 DIAGNOSIS — M19041 Primary osteoarthritis, right hand: Secondary | ICD-10-CM | POA: Diagnosis not present

## 2021-12-13 DIAGNOSIS — M19012 Primary osteoarthritis, left shoulder: Secondary | ICD-10-CM

## 2021-12-13 DIAGNOSIS — E039 Hypothyroidism, unspecified: Secondary | ICD-10-CM

## 2021-12-13 DIAGNOSIS — M19072 Primary osteoarthritis, left ankle and foot: Secondary | ICD-10-CM

## 2021-12-13 DIAGNOSIS — E785 Hyperlipidemia, unspecified: Secondary | ICD-10-CM | POA: Diagnosis not present

## 2021-12-13 DIAGNOSIS — M19071 Primary osteoarthritis, right ankle and foot: Secondary | ICD-10-CM

## 2021-12-13 DIAGNOSIS — M19011 Primary osteoarthritis, right shoulder: Secondary | ICD-10-CM | POA: Diagnosis not present

## 2021-12-13 DIAGNOSIS — I10 Essential (primary) hypertension: Secondary | ICD-10-CM

## 2021-12-13 LAB — CBC
HCT: 28.4 % — ABNORMAL LOW (ref 36.0–46.0)
Hemoglobin: 9.4 g/dL — ABNORMAL LOW (ref 12.0–15.0)
MCHC: 33.1 g/dL (ref 30.0–36.0)
MCV: 90.8 fl (ref 78.0–100.0)
Platelets: 188 10*3/uL (ref 150.0–400.0)
RBC: 3.13 Mil/uL — ABNORMAL LOW (ref 3.87–5.11)
RDW: 18.1 % — ABNORMAL HIGH (ref 11.5–15.5)
WBC: 4.8 10*3/uL (ref 4.0–10.5)

## 2021-12-13 LAB — COMPREHENSIVE METABOLIC PANEL
ALT: 8 U/L (ref 0–35)
AST: 17 U/L (ref 0–37)
Albumin: 4.3 g/dL (ref 3.5–5.2)
Alkaline Phosphatase: 70 U/L (ref 39–117)
BUN: 50 mg/dL — ABNORMAL HIGH (ref 6–23)
CO2: 26 mEq/L (ref 19–32)
Calcium: 9 mg/dL (ref 8.4–10.5)
Chloride: 100 mEq/L (ref 96–112)
Creatinine, Ser: 1.28 mg/dL — ABNORMAL HIGH (ref 0.40–1.20)
GFR: 38.58 mL/min — ABNORMAL LOW (ref >60.00)
Glucose, Bld: 85 mg/dL (ref 70–99)
Potassium: 4.3 mEq/L (ref 3.5–5.1)
Sodium: 136 mEq/L (ref 135–145)
Total Bilirubin: 0.9 mg/dL (ref 0.2–1.2)
Total Protein: 6.6 g/dL (ref 6.0–8.3)

## 2021-12-13 LAB — LIPID PANEL
Cholesterol: 145 mg/dL (ref 0–200)
HDL: 71.6 mg/dL (ref >39.00)
LDL Cholesterol: 56 mg/dL (ref 0–99)
NonHDL: 73.04
Total CHOL/HDL Ratio: 2
Triglycerides: 85 mg/dL (ref 0.0–149.0)
VLDL: 17 mg/dL (ref 0.0–40.0)

## 2021-12-13 LAB — TSH: TSH: 0.81 u[IU]/mL (ref 0.35–5.50)

## 2021-12-13 MED ORDER — TRAMADOL HCL 50 MG PO TABS: 50.0000 mg | ORAL_TABLET | Freq: Every day | ORAL | 0 refills | Status: DC | PRN

## 2021-12-13 NOTE — Assessment & Plan Note (Signed)
Continue Tramadol 50 mg daily as needed. Refills provided.

## 2021-12-13 NOTE — Patient Instructions (Signed)
Stop by the lab prior to leaving today. I will notify you of your results once received.   Have your pharmacy notify me when you are needing refills of your medications.  It was a pleasure to see you today!

## 2021-12-13 NOTE — Assessment & Plan Note (Signed)
Controlled.  Continue to monitor.  Remain off medications.

## 2021-12-13 NOTE — Assessment & Plan Note (Signed)
She is taking levothyroxine correctly. Continue levothyroxine 88 mcg. Repeat TSH pending.

## 2021-12-13 NOTE — Progress Notes (Signed)
Subjective:    Patient ID: Kathleen Paul, female    DOB: Mar 02, 1936, 86 y.o.   MRN: [id number]  HPI  Kathleen Paul is a very pleasant 86 y.o. female with a history of CAD, hypertension, primary osteoarthritis to multiple locations, renal insufficiency, hyperlipidemia, GAD who presents today for follow up of chronic conditions. Her daughter joins [place] today.  1) Hypothyroidism: Currently managed on levothyroxine 88 mcg daily.   She is taking her levothyroxine every morning on an empty stomach. No food or other medications for 30 minutes. She is due for repeat TSH today.  2) Osteoarthritis/Chronic Pain Syndrome: Currently managed on Tramadol 50 mg PRN for which she takes once daily. She has been out of her Tramadol for months as she never called for a refill.   She is currently taking Tylenol twice daily which helps some. The Tramadol allows her to be more active without pain.   3) Essential Hypertension: Currently managed on enalapril-HCTZ 10-25 mg, metoprolol tartrate 25 mg BID. She denies dizziness, chest pain.   4) Hyperlipidemia: Currently managed on simvastatin 20 mg daily. She is due for repeat lipid panel today.   BP Readings from Last 3 Encounters:  12/13/21 140/60  06/30/21 120/80  12/22/20 126/68      Review of Systems  Respiratory:  Negative for shortness of breath.   Cardiovascular:  Negative for chest pain.        Past Medical History:  Diagnosis Date   Anxiety state, unspecified    Arthritis    Atherosclerosis    Cancer ([place])    skin cancer   Diverticulosis    Esophageal stricture    Essential hypertension, benign    Gastric ulcer    Hiatal hernia    History of DVT (deep vein thrombosis) yrs ago   legs   IDA (iron deficiency anemia)    Inguinal hernia without mention of obstruction or gangrene, unilateral or unspecified, (not specified as recurrent)    2014   Internal hemorrhoids    Mild mitral regurgitation    a. 04/2010 Echo: nl EF, mild diast  dysfxn, trace PR, mild TR, mild-mod MR.   Non-obstructive CAD    a. 04/2010 Myoview: EF 77%, fixed apical/inferior defect;  b. 04/2010 Cath: LAD 52m LCX/RCA minor irregs, EF 60%;  c. 04/2011 Myoview: low risk.   Pure hypercholesterolemia    Rupture of left patellar tendon    05-26-2019   Scoliosis    UI (urinary incontinence)    Umbilical hernia without mention of obstruction or gangrene    2014   Unspecified hypothyroidism    Vitamin D deficiency     Social History   Socioeconomic History   Marital status: Widowed    Spouse name: Not on file   Number of children: Not on file   Years of education: Not on file   Highest education level: Not on file  Occupational History   Not on file  Tobacco Use   Smoking status: Former    Packs/day: 0.50    Years: 30.00    Pack years: 15.00    Types: Cigarettes    Quit date: 07/10/1988    Years since quitting: 33.4   Smokeless tobacco: Never  Vaping Use   Vaping Use: Never used  Substance and Sexual Activity   Alcohol use: No   Drug use: Never   Sexual activity: Not on file  Other Topics Concern   Not on file  Social History Narrative   Not  on file   Social Determinants of Health   Financial Resource Strain: Low Risk    Difficulty of Paying Living Expenses: Not hard at all  Food Insecurity: No Food Insecurity   Worried About Charity fundraiser in the Last Year: Never true   Arboriculturist in the Last Year: Never true  Transportation Needs: No Transportation Needs   Lack of Transportation (Medical): No   Lack of Transportation (Non-Medical): No  Physical Activity: Inactive   Days of Exercise per Week: 0 days   Minutes of Exercise per Session: 0 min  Stress: No Stress Concern Present   Feeling of Stress : Not at all  Social Connections: Not on file  Intimate Partner Violence: Not At Risk   Fear of Current or Ex-Partner: No   Emotionally Abused: No   Physically Abused: No   Sexually Abused: No    Past Surgical  History:  Procedure Laterality Date   ABDOMINAL HYSTERECTOMY  2008   complete    BIOPSY  04/02/2018   Procedure: BIOPSY;  Surgeon: Irving Copas., MD;  Location: [facility];  Service: Gastroenterology;;   CARDIAC CATHETERIZATION  2011   [facility]   COLONOSCOPY  2003   [facility]   ESOPHAGOGASTRODUODENOSCOPY (EGD) WITH PROPOFOL N/A 04/02/2018   Procedure: ESOPHAGOGASTRODUODENOSCOPY (EGD) WITH PROPOFOL;  Surgeon: Irving Copas., MD;  Location: [facility];  Service: Gastroenterology;  Laterality: N/A;   FOREIGN BODY REMOVAL  04/02/2018   Procedure: FOREIGN BODY REMOVAL;  Surgeon: Rush Landmark Telford Nab., MD;  Location: [facility];  Service: Gastroenterology;;   HERNIA REPAIR Right 09/23/2012   repair RIH   LAPAROSCOPIC HYSTERECTOMY     PATELLAR TENDON REPAIR Left 06/03/2019   Procedure: LEFT PATELLA TENDON REPAIR, RIGHT KNEE INJECTION.;  Surgeon: Paralee Cancel, MD;  Location: [facility];  Service: Orthopedics;  Laterality: Left;   PATELLAR TENDON REPAIR Left 07/01/2019   Procedure: OPEN PATELLA TENDON REPAIR;  Surgeon: Paralee Cancel, MD;  Location: [facility];  Service: Orthopedics;  Laterality: Left;   TONSILLECTOMY     TOTAL KNEE ARTHROPLASTY Left 02/11/2019   Procedure: TOTAL KNEE ARTHROPLASTY;  Surgeon: Paralee Cancel, MD;  Location: [facility];  Service: Orthopedics;  Laterality: Left;  70 mins    Family History  Problem Relation Age of Onset   Heart disease Mother    Heart attack Mother    Heart disease Father    Heart attack Father    Breast cancer Sister    Breast cancer Maternal Aunt    Heart disease Brother        sister    Allergies  Allergen Reactions   Codeine Nausea Only    Current Outpatient Medications on File Prior to Visit  Medication Sig Dispense Refill   acetaminophen (TYLENOL) 500 MG tablet Take 2 tablets (1,000 mg total) by mouth every 8 (eight) hours. 30 tablet 0   aspirin EC 81 MG tablet Take 81 mg by mouth daily. Swallow whole.      Calcium-Magnesium-Vitamin D (CALCIUM 1200+D3 PO) Take 1 tablet by mouth daily.     enalapril-hydrochlorothiazide (VASERETIC) 10-25 MG tablet Take 1 tablet by mouth daily. For blood pressure. Office visit required for further refills. 90 tablet 0   levothyroxine (SYNTHROID) 88 MCG tablet TAKE 1 TABLET BY MOUTH EVERY MORNING ON AN EMPTY STOMACH WITH WATER ONLY. NO OTHER FOOD OR MEDS FOR 30 MINUTES. Office visit required for further refills. 90 tablet 0   metoprolol tartrate (LOPRESSOR) 25 MG tablet TAKE 1  TABLET BY MOUTH EVERY EVENING for blood pressure. Office visit required for further refills. 90 tablet 0   Multiple Vitamin (MULTIVITAMIN PO) Take 1 tablet by mouth daily.     simvastatin (ZOCOR) 20 MG tablet Take 1 tablet (20 mg total) by mouth every evening. for cholesterol. Office visit required for further refills. 90 tablet 0   No current facility-administered medications on file prior to visit.    BP 140/60   Pulse (!) 48   Temp 98.6 F (37 C) (Oral)   Ht 5' (1.524 m)   Wt 157 lb (71.2 kg)   SpO2 99%   BMI 30.66 kg/m  Objective:   Physical Exam Cardiovascular:     Rate and Rhythm: Normal rate and regular rhythm.  Pulmonary:     Effort: Pulmonary effort is normal.     Breath sounds: Normal breath sounds.  Abdominal:     General: Bowel sounds are normal.     Palpations: Abdomen is soft.     Tenderness: There is no abdominal tenderness.  Musculoskeletal:     Cervical back: Neck supple.  Skin:    General: Skin is warm and dry.  Neurological:     Mental Status: She is oriented to person, place, and time.  Psychiatric:        Mood and Affect: Mood normal.          Assessment & Plan:

## 2021-12-13 NOTE — Assessment & Plan Note (Signed)
Continue simvastatin 20 mg daily. Repeat lipid panel pending. 

## 2021-12-13 NOTE — Assessment & Plan Note (Signed)
Overall controlled. Home readings are in the 120's/70's range.   Continue enalapril-HCTZ 10-25 mg daily, metoprolol tartrate 25 mg BID. CMP pending.

## 2021-12-14 ENCOUNTER — Other Ambulatory Visit: Payer: Self-pay | Admitting: Primary Care

## 2021-12-14 ENCOUNTER — Ambulatory Visit (INDEPENDENT_AMBULATORY_CARE_PROVIDER_SITE_OTHER): Payer: PPO

## 2021-12-14 VITALS — Ht 60.0 in | Wt 157.0 lb

## 2021-12-14 DIAGNOSIS — Z0000 Encounter for general adult medical examination without abnormal findings: Secondary | ICD-10-CM

## 2021-12-14 DIAGNOSIS — D649 Anemia, unspecified: Secondary | ICD-10-CM

## 2021-12-14 NOTE — Progress Notes (Signed)
Subjective:   Kathleen Paul is a 86 y.o. female who presents for Medicare Annual (Subsequent) preventive examination. Virtual Visit via Telephone Note  I connected with  Kathleen Paul on 12/14/21 at  1:15 PM EDT by telephone and verified that I am speaking with the correct person using two identifiers.  Location: Patient: HOME Provider: [facility] Persons participating in the virtual visit: patient/Nurse Health Advisor   I discussed the limitations, risks, security and privacy concerns of performing an evaluation and management service by telephone and the availability of in person appointments. The patient expressed understanding and agreed to proceed.  Interactive audio and video telecommunications were attempted between this nurse and patient, however failed, due to patient having technical difficulties OR patient did not have access to video capability.  We continued and completed visit with audio only.  Some vital signs may be absent or patient reported.   Chriss Driver, LPN  Review of Systems     Cardiac Risk Factors include: advanced age (>79mn, >>13women);hypertension;dyslipidemia;sedentary lifestyle;obesity (BMI >30kg/m2);Other (see comment), Risk factor comments: Osteoarthritis of both knees.     Objective:    Today's Vitals   12/14/21 1313  Weight: 157 lb (71.2 kg)  Height: 5' (1.524 m)   Body mass index is 30.66 kg/m.     12/14/2021    1:18 PM 12/13/2020    1:24 PM 11/05/2019    3:34 PM 07/01/2019    5:00 PM 07/01/2019   11:52 AM 06/27/2019    8:16 AM 06/04/2019   12:55 PM  Advanced Directives  Does Patient Have a Medical Advance Directive? Yes Yes Yes Yes Yes Yes Yes  Type of AParamedicof AMar[place]Living will H[place]Living will HBar[place]Living will HBallantineLiving will HJasperLiving will Living will;Healthcare Power of Attorney   Does  patient want to make changes to medical advance directive?    No - Patient declined No - Patient declined No - Patient declined   Copy of [place] Chart? Yes - validated most recent copy scanned in chart (See row information) Yes - validated most recent copy scanned in chart (See row information) Yes - validated most recent copy scanned in chart (See row information)  No - copy requested No - copy requested     Current Medications (verified) Outpatient Encounter Medications as of 12/14/2021  Medication Sig   acetaminophen (TYLENOL) 500 MG tablet Take 2 tablets (1,000 mg total) by mouth every 8 (eight) hours.   aspirin EC 81 MG tablet Take 81 mg by mouth daily. Swallow whole.   Calcium-Magnesium-Vitamin D (CALCIUM 1200+D3 PO) Take 1 tablet by mouth daily.   enalapril-hydrochlorothiazide (VASERETIC) 10-25 MG tablet Take 1 tablet by mouth daily. For blood pressure. Office visit required for further refills.   levothyroxine (SYNTHROID) 88 MCG tablet TAKE 1 TABLET BY MOUTH EVERY MORNING ON AN EMPTY STOMACH WITH WATER ONLY. NO OTHER FOOD OR MEDS FOR 30 MINUTES. Office visit required for further refills.   metoprolol tartrate (LOPRESSOR) 25 MG tablet TAKE 1 TABLET BY MOUTH EVERY EVENING for blood pressure. Office visit required for further refills.   Multiple Vitamin (MULTIVITAMIN PO) Take 1 tablet by mouth daily.   simvastatin (ZOCOR) 20 MG tablet Take 1 tablet (20 mg total) by mouth every evening. for cholesterol. Office visit required for further refills.   traMADol (ULTRAM) 50 MG tablet Take 1 tablet (50 mg total) by mouth  daily as needed for severe pain.   No facility-administered encounter medications on file as of 12/14/2021.    Allergies (verified) Codeine   History: Past Medical History:  Diagnosis Date   Anxiety state, unspecified    Arthritis    Atherosclerosis    Cancer ([place])    skin cancer   Diverticulosis    Esophageal stricture    Essential hypertension,  benign    Gastric ulcer    Hiatal hernia    History of DVT (deep vein thrombosis) yrs ago   legs   IDA (iron deficiency anemia)    Inguinal hernia without mention of obstruction or gangrene, unilateral or unspecified, (not specified as recurrent)    2014   Internal hemorrhoids    Mild mitral regurgitation    a. 04/2010 Echo: nl EF, mild diast dysfxn, trace PR, mild TR, mild-mod MR.   Non-obstructive CAD    a. 04/2010 Myoview: EF 77%, fixed apical/inferior defect;  b. 04/2010 Cath: LAD 35m LCX/RCA minor irregs, EF 60%;  c. 04/2011 Myoview: low risk.   Pure hypercholesterolemia    Rupture of left patellar tendon    05-26-2019   Scoliosis    UI (urinary incontinence)    Umbilical hernia without mention of obstruction or gangrene    2014   Unspecified hypothyroidism    Vitamin D deficiency    Past Surgical History:  Procedure Laterality Date   ABDOMINAL HYSTERECTOMY  2008   complete    BIOPSY  04/02/2018   Procedure: BIOPSY;  Surgeon: MIrving Copas, MD;  Location: [facility];  Service: Gastroenterology;;   CARDIAC CATHETERIZATION  2011   [facility]   COLONOSCOPY  2003   [facility]   ESOPHAGOGASTRODUODENOSCOPY (EGD) WITH PROPOFOL N/A 04/02/2018   Procedure: ESOPHAGOGASTRODUODENOSCOPY (EGD) WITH PROPOFOL;  Surgeon: MIrving Copas, MD;  [facility];  Service: Gastroenterology;  Laterality: N/A;   FOREIGN BODY REMOVAL  04/02/2018   Procedure: FOREIGN BODY REMOVAL;  Surgeon: MRush LandmarkGTelford Nab, MD;  Location: [facility];  Service: Gastroenterology;;   HERNIA REPAIR Right 09/23/2012   repair RIH   LAPAROSCOPIC HYSTERECTOMY     PATELLAR TENDON REPAIR Left 06/03/2019   Procedure: LEFT PATELLA TENDON REPAIR, RIGHT KNEE INJECTION.;  Surgeon: OParalee Cancel MD;  [facility]: [facility];  Service: Orthopedics;  Laterality: Left;   PATELLAR TENDON REPAIR Left 07/01/2019   Procedure: OPEN PATELLA TENDON REPAIR;  Surgeon: OParalee Cancel MD;  Location: [facility];   Service: Orthopedics;  Laterality: Left;   TONSILLECTOMY     TOTAL KNEE ARTHROPLASTY Left 02/11/2019   Procedure: TOTAL KNEE ARTHROPLASTY;  Surgeon: OParalee Cancel MD;  Location: [facility];  Service: Orthopedics;  Laterality: Left;  70 mins   Family History  Problem Relation Age of Onset   Heart disease Mother    Heart attack Mother    Heart disease Father    Heart attack Father    Breast cancer Sister    Breast cancer Maternal Aunt    Heart disease Brother        sister   Social History   Socioeconomic History   Marital status: Widowed    Spouse name: Not on file   Number of children: Not on file   Years of education: Not on file   Highest education level: Not on file  Occupational History   Not on file  Tobacco Use   Smoking status: Former    Packs/day: 0.50    Years: 30.00    Pack years: 15.00  Types: Cigarettes    Quit date: 07/10/1988    Years since quitting: 33.4   Smokeless tobacco: Never  Vaping Use   Vaping Use: Never used  Substance and Sexual Activity   Alcohol use: No   Drug use: Never   Sexual activity: Not on file  Other Topics Concern   Not on file  Social History Narrative   Not on file   Social Determinants of Health   Financial Resource Strain: Low Risk    Difficulty of Paying Living Expenses: Not hard at all  Food Insecurity: No Food Insecurity   Worried About Charity fundraiser in the Last Year: Never true   [place] in the Last Year: Never true  Transportation Needs: No Transportation Needs   Lack of Transportation (Medical): No   Lack of Transportation (Non-Medical): No  Physical Activity: Sufficiently Active   Days of Exercise per Week: 5 days   Minutes of Exercise per Session: 30 min  Stress: No Stress Concern Present   Feeling of Stress : Not at all  Social Connections: Moderately Isolated   Frequency of Communication with Friends and Family: More than three times a week   Frequency of Social Gatherings with Friends and  Family: More than three times a week   Attends Religious Services: 1 to 4 times per year   Active Member of [employer] or Organizations: No   Attends Archivist Meetings: Never   Marital Status: Widowed    Tobacco Counseling Counseling given: Not Answered   Clinical Intake:  Pre-visit preparation completed: Yes  Pain : No/denies pain     BMI - recorded: 30.66 Nutritional Status: BMI > 30  Obese Nutritional Risks: None Diabetes: No  How often do you need to have someone help you when you read instructions, pamphlets, or other written materials from your doctor or pharmacy?: 1 - Never  Diabetic?NO  Interpreter Needed?: No  Information entered by :: mj Raina Sole, lpn   Activities of Daily Living    12/14/2021    1:23 PM 12/13/2021    8:57 AM  In your present state of health, do you have any difficulty performing the following activities:  Hearing? 0 0  Vision? 0 0  Difficulty concentrating or making decisions? 0 0  Walking or climbing stairs? 1 1  Dressing or bathing? 1 1  Doing errands, shopping? 0 0  Preparing Food and eating ? N   Using the Toilet? N   In the past six months, have you accidently leaked urine? N   Do you have problems with loss of bowel control? N   Managing your Medications? N   Managing your Finances? N   Housekeeping or managing your Housekeeping? N     Patient Care Team: [name], NP as PCP - General (Internal Medicine) Fay Records, MD as PCP - Cardiology (Cardiology) Charlton Haws, [facility] as Pharmacist (Pharmacist)  Indicate any recent Medical Services you may have received from other than [facility] providers in the past year (date may be approximate).     Assessment:   This is a routine wellness examination for Kathleen Paul.  Hearing/Vision screen Hearing Screening - Comments:: Some hearing issues.  Vision Screening - Comments:: Glasses. Exline.  Dietary issues and exercise activities discussed: Current Exercise  Habits: Home exercise routine, Type of exercise: walking, Time (Minutes): 20, Frequency (Times/Week): 3, Weekly Exercise (Minutes/Week): 60, Intensity: Mild, Exercise limited by: orthopedic condition(s);cardiac condition(s)   Goals Addressed  This Visit's Progress    Exercise 3x per week (30 min per time)       Stay active, mentally and physically.     Patient Stated   On track    11/05/2019, I will maintain and continue medications as prescribed.      Prevent Falls and Broken Bones-Osteoporosis   On track    Timeframe:  Long-Range Goal Priority:  High Start Date:         06/01/21                    Expected End Date:     06/01/22                  Follow Up Date May 2023   - always use handrails on the stairs - always wear shoes or slippers with non-slip sole - get at least 10 minutes of activity every day - keep cell phone with me always - make an emergency alert plan in case I fall - pick up clutter from the floors - use a cane or walker - use a nightlight in the bathroom    Why is this important?   When you fall, there are 3 things that control if a bone breaks or not.  These are the fall itself, how hard and the direction that you fall and how fragile your bones are.  Preventing falls is very important for you because of fragile bones.     Notes:        Depression Screen    12/14/2021    1:15 PM 12/13/2021    8:57 AM 12/13/2020    1:28 PM 11/05/2019    3:35 PM  PHQ 2/9 Scores  PHQ - 2 Score 0 0 0 0  PHQ- 9 Score 0 0 0 0    Fall Risk    12/14/2021    1:18 PM 12/13/2021    8:58 AM 12/13/2020    1:27 PM 11/05/2019    3:34 PM 02/05/2019    5:18 PM  [place] in the past year? 0 0 0 0 0  Comment     Emmi Telephone Survey: data to providers prior to load  Number falls in past yr: 0 0 0 0   Injury with Fall? 0 0 0 0   Risk for fall due to : No Fall Risks Impaired balance/gait;Impaired mobility Medication side effect Impaired mobility;Impaired  balance/gait   Risk for fall due to: Comment    knee surgery   Follow up Falls prevention discussed  Falls evaluation completed;Falls prevention discussed Falls evaluation completed;Falls prevention discussed     FALL RISK PREVENTION PERTAINING TO THE HOME:  Any stairs in or around the home? Yes  If so, are there any without handrails? No  Home free of loose throw rugs in walkways, pet beds, electrical cords, etc? Yes  Adequate lighting in your home to reduce risk of falls? Yes   ASSISTIVE DEVICES UTILIZED TO PREVENT FALLS:  Life alert? Yes  Use of a cane, walker or w/c? Yes  Grab bars in the bathroom? No  Shower chair or bench in shower? Yes  Elevated toilet seat or a handicapped toilet? Yes   TIMED UP AND GO:  Was the test performed? No .  Phone visit.    Cognitive Function:    12/13/2020    1:33 PM 11/05/2019    3:36 PM  MMSE - Mini Mental State Exam  Orientation to  time 5 5  Orientation to Place 5 5  Registration 3 3  Attention/ Calculation 5 5  Recall 3 3  Language- repeat 1 1        12/14/2021    1:24 PM  6CIT Screen  What Year? 0 points  What month? 0 points  What time? 0 points  Count back from 20 0 points  Months in reverse 0 points  Repeat phrase 0 points  Total Score 0 points    Immunizations Immunization History  Administered Date(s) Administered   Influenza Split 04/02/2012   Influenza, High Dose Seasonal PF 04/16/2017   Influenza,inj,Quad PF,6+ Mos 04/29/2013, 04/18/2018, 04/29/2019, 04/19/2020   Influenza-Unspecified 04/30/2016   PFIZER(Purple Top)SARS-COV-2 Vaccination 09/24/2019, 10/15/2019   Pneumococcal Conjugate-13 04/29/2019   Pneumococcal Polysaccharide-23 04/18/2018    TDAP status: Due, Education has been provided regarding the importance of this vaccine. Advised may receive this vaccine at local pharmacy or Health Dept. Aware to provide a copy of the vaccination record if obtained from local pharmacy or Health Dept. Verbalized  acceptance and understanding.  Flu Vaccine status: Due, Education has been provided regarding the importance of this vaccine. Advised may receive this vaccine at local pharmacy or Health Dept. Aware to provide a copy of the vaccination record if obtained from local pharmacy or Health Dept. Verbalized acceptance and understanding.  Pneumococcal vaccine status: Up to date  Covid-19 vaccine status: Completed vaccines  Qualifies for Shingles Vaccine? Yes   Zostavax completed No   Shingrix Completed?: No.    Education has been provided regarding the importance of this vaccine. Patient has been advised to call insurance company to determine out of pocket expense if they have not yet received this vaccine. Advised may also receive vaccine at local pharmacy or Health Dept. Verbalized acceptance and understanding.  Screening Tests Health Maintenance  Topic Date Due   Zoster Vaccines- Shingrix (1 of 2) 06/14/2022 (Originally 11/12/1954)   TETANUS/TDAP  11/05/2023 (Originally 11/12/1954)   INFLUENZA VACCINE  02/07/2022   Pneumonia Vaccine 21+ Years old  Completed   HPV VACCINES  Aged Out   DEXA SCAN  Discontinued   COVID-19 Vaccine  Discontinued    Health Maintenance  There are no preventive care reminders to display for this patient.  Colorectal cancer screening: No longer required.   Mammogram status: No longer required due to age.  Bone Density status: Ordered pt declined. Pt provided with contact info and advised to call to schedule appt.  Lung Cancer Screening: (Low Dose CT Chest recommended if Age 58-80 years, 30 pack-year currently smoking OR have quit w/in 15years.) does not qualify.   Additional Screening:  Hepatitis C Screening: does not qualify;  Vision Screening: Recommended annual ophthalmology exams for early detection of glaucoma and other disorders of the eye. Is the patient up to date with their annual eye exam?  Yes  Who is the provider or what is the name of the office  in which the patient attends annual eye exams? [place] If pt is not established with a provider, would they like to be referred to a provider to establish care? No .   Dental Screening: Recommended annual dental exams for proper oral hygiene  Community Resource Referral / Chronic Care Management: CRR required this visit?  No   CCM required this visit?  No      Plan:     I have personally reviewed and noted the following in the patient's chart:   Medical and social history Use of  alcohol, tobacco or illicit drugs  Current medications and supplements including opioid prescriptions.  Functional ability and status Nutritional status Physical activity Advanced directives List of other physicians Hospitalizations, surgeries, and ER visits in previous 12 months Vitals Screenings to include cognitive, depression, and falls Referrals and appointments  In addition, I have reviewed and discussed with patient certain preventive protocols, quality metrics, and best practice recommendations. A written personalized care plan for preventive services as well as general preventive health recommendations were provided to patient.     Korbin Notaro, LPN   12/10/4467   Nurse Notes: Discussed Shingrix and how to obtain. Pt declined DEXA.

## 2021-12-14 NOTE — Patient Instructions (Signed)
[name] , Thank you for taking time to come for your Medicare Wellness Visit. I appreciate your ongoing commitment to your health goals. Please review the following plan we discussed and let me know if I can assist you in the future.   Screening recommendations/referrals: Colonoscopy: No longer required. Mammogram: No longer required. Bone Density: No longer required.  Recommended yearly ophthalmology/optometry visit for glaucoma screening and checkup Recommended yearly dental visit for hygiene and checkup  Vaccinations: Influenza vaccine: Due Fall 2023. Pneumococcal vaccine: Done 04/29/2019 and 04/18/2018. Tdap vaccine: Due every 10 years. Shingles vaccine: Discussed, available at your local pharmacy. Covid-19:Done 10/15/2019 and 09/24/2019  Advanced directives: Copy in chart.  Conditions/risks identified: Aim for 6-8 glasses of water, and 5 servings of fruits and vegetables each day.   Next appointment: Follow up in one year for your annual wellness visit 2024.   Preventive Care 8 Years and Older, Female Preventive care refers to lifestyle choices and visits with your health care provider that can promote health and wellness. What does preventive care include? A yearly physical exam. This is also called an annual well check. Dental exams once or twice a year. Routine eye exams. Ask your health care provider how often you should have your eyes checked. Personal lifestyle choices, including: Daily care of your teeth and gums. Regular physical activity. Eating a healthy diet. Avoiding tobacco and drug use. Limiting alcohol use. Practicing safe sex. Taking low-dose aspirin every day. Taking vitamin and mineral supplements as recommended by your health care provider. What happens during an annual well check? The services and screenings done by your health care provider during your annual well check will depend on your age, overall health, lifestyle risk factors, and family  history of disease. Counseling  Your health care provider may ask you questions about your: Alcohol use. Tobacco use. Drug use. Emotional well-being. Home and relationship well-being. Sexual activity. Eating habits. History of falls. Memory and ability to understand (cognition). Work and work Statistician. Reproductive health. Screening  You may have the following tests or measurements: Height, weight, and BMI. Blood pressure. Lipid and cholesterol levels. These may be checked every 5 years, or more frequently if you are over 39 years old. Skin check. Lung cancer screening. You may have this screening every year starting at age 32 if you have a 30-pack-year history of smoking and currently smoke or have quit within the past 15 years. Fecal occult blood test (FOBT) of the stool. You may have this test every year starting at age 10. Flexible sigmoidoscopy or colonoscopy. You may have a sigmoidoscopy every 5 years or a colonoscopy every 10 years starting at age 70. Hepatitis C blood test. Hepatitis B blood test. Sexually transmitted disease (STD) testing. Diabetes screening. This is done by checking your blood sugar (glucose) after you have not eaten for a while (fasting). You may have this done every 1-3 years. Bone density scan. This is done to screen for osteoporosis. You may have this done starting at age 33. Mammogram. This may be done every 1-2 years. Talk to your health care provider about how often you should have regular mammograms. Talk with your health care provider about your test results, treatment options, and if necessary, the need for more tests. Vaccines  Your health care provider may recommend certain vaccines, such as: Influenza vaccine. This is recommended every year. Tetanus, diphtheria, and acellular pertussis (Tdap, Td) vaccine. You may need a Td booster every 10 years. Zoster vaccine. You may need this  after age 37. Pneumococcal 13-valent conjugate (PCV13)  vaccine. One dose is recommended after age 20. Pneumococcal polysaccharide (PPSV23) vaccine. One dose is recommended after age 13. Talk to your health care provider about which screenings and vaccines you need and how often you need them. This information is not intended to replace advice given to you by your health care provider. Make sure you discuss any questions you have with your health care provider. Document Released: 07/23/2015 Document Revised: 03/15/2016 Document Reviewed: 04/27/2015 Elsevier Interactive Patient Education  2017 [place] Prevention in the Home Falls can cause injuries. They can happen to people of all ages. There are many things you can do to make your home safe and to help prevent falls. What can I do on the outside of my home? Regularly fix the edges of walkways and driveways and fix any cracks. Remove anything that might make you trip as you walk through a door, such as a raised step or threshold. Trim any bushes or trees on the path to your home. Use bright outdoor lighting. Clear any walking paths of anything that might make someone trip, such as rocks or tools. Regularly check to see if handrails are loose or broken. Make sure that both sides of any steps have handrails. Any raised decks and porches should have guardrails on the edges. Have any leaves, snow, or ice cleared regularly. Use sand or salt on walking paths during winter. Clean up any spills in your garage right away. This includes oil or grease spills. What can I do in the bathroom? Use night lights. Install grab bars by the toilet and in the tub and shower. Do not use towel bars as grab bars. Use non-skid mats or decals in the tub or shower. If you need to sit down in the shower, use a plastic, non-slip stool. Keep the floor dry. Clean up any water that spills on the floor as soon as it happens. Remove soap buildup in the tub or shower regularly. Attach bath mats securely with  double-sided non-slip rug tape. Do not have throw rugs and other things on the floor that can make you trip. What can I do in the bedroom? Use night lights. Make sure that you have a light by your bed that is easy to reach. Do not use any sheets or blankets that are too big for your bed. They should not hang down onto the floor. Have a firm chair that has side arms. You can use this for support while you get dressed. Do not have throw rugs and other things on the floor that can make you trip. What can I do in the kitchen? Clean up any spills right away. Avoid walking on wet floors. Keep items that you use a lot in easy-to-reach places. If you need to reach something above you, use a strong step stool that has a grab bar. Keep electrical cords out of the way. Do not use floor polish or wax that makes floors slippery. If you must use wax, use non-skid floor wax. Do not have throw rugs and other things on the floor that can make you trip. What can I do with my stairs? Do not leave any items on the stairs. Make sure that there are handrails on both sides of the stairs and use them. Fix handrails that are broken or loose. Make sure that handrails are as long as the stairways. Check any carpeting to make sure that it is firmly attached to the  stairs. Fix any carpet that is loose or worn. Avoid having throw rugs at the top or bottom of the stairs. If you do have throw rugs, attach them to the floor with carpet tape. Make sure that you have a light switch at the top of the stairs and the bottom of the stairs. If you do not have them, ask someone to add them for you. What else can I do to help prevent falls? Wear shoes that: Do not have high heels. Have rubber bottoms. Are comfortable and fit you well. Are closed at the toe. Do not wear sandals. If you use a stepladder: Make sure that it is fully opened. Do not climb a closed stepladder. Make sure that both sides of the stepladder are locked  into place. Ask someone to hold it for you, if possible. Clearly mark and make sure that you can see: Any grab bars or handrails. First and last steps. Where the edge of each step is. Use tools that help you move around (mobility aids) if they are needed. These include: Canes. Walkers. Scooters. Crutches. Turn on the lights when you go into a dark area. Replace any light bulbs as soon as they burn out. Set up your furniture so you have a clear path. Avoid moving your furniture around. If any of your floors are uneven, fix them. If there are any pets around you, be aware of where they are. Review your medicines with your doctor. Some medicines can make you feel dizzy. This can increase your chance of falling. Ask your doctor what other things that you can do to help prevent falls. This information is not intended to replace advice given to you by your health care provider. Make sure you discuss any questions you have with your health care provider. Document Released: 04/22/2009 Document Revised: 12/02/2015 Document Reviewed: 07/31/2014 Elsevier Interactive Patient Education  2017 [employer].

## 2021-12-19 ENCOUNTER — Other Ambulatory Visit (INDEPENDENT_AMBULATORY_CARE_PROVIDER_SITE_OTHER): Payer: PPO

## 2021-12-19 DIAGNOSIS — D649 Anemia, unspecified: Secondary | ICD-10-CM | POA: Diagnosis not present

## 2021-12-19 LAB — IBC + FERRITIN
Ferritin: 15 ng/mL (ref 10.0–291.0)
Iron: 120 ug/dL (ref 42–145)
Saturation Ratios: 24.4 % (ref 20.0–50.0)
TIBC: 491.4 ug/dL — ABNORMAL HIGH (ref 250.0–450.0)
Transferrin: 351 mg/dL (ref 212.0–360.0)

## 2021-12-19 LAB — VITAMIN B12: Vitamin B-12: 893 pg/mL (ref 211–911)

## 2021-12-26 NOTE — Telephone Encounter (Signed)
As Kathleen Paul is out I reviewed chart, it looks like Kathleen Paul wanted to get a stool test for blood.  Recommend calling patient to see if there is confusion around stool versus urine.  No documentation in the chart that she wanted a urine sample.

## 2021-12-26 NOTE — Telephone Encounter (Signed)
I do not see where order was placed or ran? Am I looking wrong?

## 2021-12-27 NOTE — Telephone Encounter (Signed)
Called patient she states that she was not having any symptoms just feels like elderly people should have their urine checked.   Informed patient that without order they would not have ran it. She states that the lab person told her that they would be run it. Advised that she must have assumed that order was in when patient wanted to leave sample. Patient was advised that if she was having any question of infection I was happy to set up with provider to have tested. She kindly declined not having any symptoms.

## 2021-12-30 DIAGNOSIS — M89322 Hypertrophy of bone, left humerus: Secondary | ICD-10-CM | POA: Diagnosis not present

## 2021-12-30 DIAGNOSIS — M25511 Pain in right shoulder: Secondary | ICD-10-CM | POA: Diagnosis not present

## 2021-12-30 DIAGNOSIS — M12811 Other specific arthropathies, not elsewhere classified, right shoulder: Secondary | ICD-10-CM | POA: Diagnosis not present

## 2021-12-30 DIAGNOSIS — M89321 Hypertrophy of bone, right humerus: Secondary | ICD-10-CM | POA: Diagnosis not present

## 2021-12-30 DIAGNOSIS — G8929 Other chronic pain: Secondary | ICD-10-CM | POA: Diagnosis not present

## 2021-12-30 DIAGNOSIS — M25512 Pain in left shoulder: Secondary | ICD-10-CM | POA: Diagnosis not present

## 2021-12-30 DIAGNOSIS — M12812 Other specific arthropathies, not elsewhere classified, left shoulder: Secondary | ICD-10-CM | POA: Diagnosis not present

## 2022-01-19 ENCOUNTER — Other Ambulatory Visit: Payer: Self-pay | Admitting: Orthopaedic Surgery

## 2022-01-19 DIAGNOSIS — M25512 Pain in left shoulder: Secondary | ICD-10-CM | POA: Diagnosis not present

## 2022-01-19 DIAGNOSIS — Z01818 Encounter for other preprocedural examination: Secondary | ICD-10-CM

## 2022-01-19 DIAGNOSIS — M25511 Pain in right shoulder: Secondary | ICD-10-CM | POA: Diagnosis not present

## 2022-01-25 ENCOUNTER — Ambulatory Visit
Admission: RE | Admit: 2022-01-25 | Discharge: 2022-01-25 | Disposition: A | Discharge status: Home / Self Care | Payer: PPO | Source: Ambulatory Visit | Attending: Orthopaedic Surgery | Admitting: Orthopaedic Surgery

## 2022-01-25 DIAGNOSIS — M19012 Primary osteoarthritis, left shoulder: Secondary | ICD-10-CM | POA: Diagnosis not present

## 2022-01-25 DIAGNOSIS — M25511 Pain in right shoulder: Secondary | ICD-10-CM | POA: Diagnosis not present

## 2022-01-25 DIAGNOSIS — Z01818 Encounter for other preprocedural examination: Secondary | ICD-10-CM

## 2022-02-03 DIAGNOSIS — M25511 Pain in right shoulder: Secondary | ICD-10-CM | POA: Diagnosis not present

## 2022-02-03 DIAGNOSIS — M25512 Pain in left shoulder: Secondary | ICD-10-CM | POA: Diagnosis not present

## 2022-02-06 DIAGNOSIS — Z01818 Encounter for other preprocedural examination: Secondary | ICD-10-CM

## 2022-02-14 NOTE — Progress Notes (Unsigned)
Office Visit    Patient Name: Kathleen Paul Date of Encounter: 02/15/2022  PCP:  Kathleen Koch, NP   [facility]  Cardiologist:  Dorris Carnes, MD  Advanced Practice Provider:  No care team member to display Electrophysiologist:  None   Chief Complaint    Kathleen Paul is a 86 y.o. female with past medical history significant for mild CAD by cardiac cath 2011 (50% mid LAD), hypertension, palpitations, fatigue, hypotension, lower extremity edema, rheumatoid arthritis, GERD, Schatzki's ring, and hiatal hernia presents today for follow-up visit. Preop clearance   She has worn a monitor in the past that showed rare PACs.  She quit smoking at age 21.  She was seen by Dr. Harrington Paul 12/2017 and she was doing well at that time.  She was then seen 01/30/2019 for preop clearance for a left total knee arthroplasty.  She was seen in the emergency department 03/2018 for esophageal obstruction and underwent urgent upper endoscopy for removal of food impaction.  She was noted to have a short area of esophageal stenosis as well as chronic inflammation and focal edema in the stomach.  She was started on omeprazole with improvement per gastroenterology.  When she was last seen in the office 01/2019 she was not having any palpitations, chest pain/pressure, SOB, orthopnea, PND, edema, lightheadedness, or syncope.  She had lost strength because she was unable to walk due to knee pain.  She was being followed by Dr. Carmina Paul for nephrology.  Today, she states she has been doing pretty well.  She has not been here since 2020 and at that time she was having knee surgery.  She had several unsuccessful knee surgeries and now her activity is limited by her leg which can give out at any time.  She has not had any chest pain, shortness of breath, dizziness, lightheadedness, or syncope.  Her heart rate is only 52 bpm today however, she states at home it is usually in the 60s.  She has not had any  recent palpitations.  She had history of DVT in her leg a long time ago.  No other issues from a cardiac standpoint.  We went through the DASI and she does not meet the minimum 4 METS requirement for clearance.  She is severely limited by her leg and has trouble with stairs as well as walking longer distances.  I will discuss her clearance with Dr. Harrington Paul and after that provide our final recommendations.  Reports no shortness of breath nor dyspnea on exertion. Reports no chest pain, pressure, or tightness. No edema, orthopnea, PND. Reports no palpitations.    Past Medical History    Past Medical History:  Diagnosis Date   Anxiety state, unspecified    Arthritis    Atherosclerosis    Cancer ([place])    skin cancer   Diverticulosis    Esophageal stricture    Essential hypertension, benign    Gastric ulcer    Hiatal hernia    History of DVT (deep vein thrombosis) yrs ago   legs   IDA (iron deficiency anemia)    Inguinal hernia without mention of obstruction or gangrene, unilateral or unspecified, (not specified as recurrent)    2014   Internal hemorrhoids    Mild mitral regurgitation    a. 04/2010 Echo: nl EF, mild diast dysfxn, trace PR, mild TR, mild-mod MR.   Non-obstructive CAD    a. 04/2010 Myoview: EF 77%, fixed apical/inferior defect;  b. 04/2010 Cath:  LAD 9m LCX/RCA minor irregs, EF 60%;  c. 04/2011 Myoview: low risk.   Pure hypercholesterolemia    Rupture of left patellar tendon    05-26-2019   Scoliosis    UI (urinary incontinence)    Umbilical hernia without mention of obstruction or gangrene    2014   Unspecified hypothyroidism    Vitamin D deficiency    Past Surgical History:  Procedure Laterality Date   ABDOMINAL HYSTERECTOMY  2008   complete    BIOPSY  04/02/2018   Procedure: BIOPSY;  Surgeon: MIrving Copas, MD;  Location: [facility];  Service: Gastroenterology;;   CARDIAC CATHETERIZATION  2011   [facility]   COLONOSCOPY  2003   [facility] [place] [facility]    ESOPHAGOGASTRODUODENOSCOPY (EGD) WITH PROPOFOL N/A 04/02/2018   Procedure: ESOPHAGOGASTRODUODENOSCOPY (EGD) WITH PROPOFOL;  Surgeon: MIrving Copas, MD;  [facility];  Service: Gastroenterology;  Laterality: N/A;   FOREIGN BODY REMOVAL  04/02/2018   Procedure: FOREIGN BODY REMOVAL;  Surgeon: MRush LandmarkGTelford Nab, MD;  Location: [facility];  Service: Gastroenterology;;   HERNIA REPAIR Right 09/23/2012   repair RIH   LAPAROSCOPIC HYSTERECTOMY     PATELLAR TENDON REPAIR Left 06/03/2019   Procedure: LEFT PATELLA TENDON REPAIR, RIGHT KNEE INJECTION.;  Surgeon: OParalee Cancel MD;  Location: [facility];  Service: Orthopedics;  Laterality: Left;   PATELLAR TENDON REPAIR Left 07/01/2019   Procedure: OPEN PATELLA TENDON REPAIR;  Surgeon: OParalee Cancel MD;  Location: [facility];  Service: Orthopedics;  Laterality: Left;   TONSILLECTOMY     TOTAL KNEE ARTHROPLASTY Left 02/11/2019   Procedure: TOTAL KNEE ARTHROPLASTY;  Surgeon: OParalee Cancel MD;  Location: [facility];  Service: Orthopedics;  Laterality: Left;  70 mins    Allergies  Allergies  Allergen Reactions   Codeine Nausea Only     EKGs/Labs/Other Studies Reviewed:   The following studies were reviewed today:  05/2017 Monitor  Monitor showed occasional skipped atrial beats, no significant arrhythmia.  She did not always sense.  Recommend increasing metoprolol to 2 times a day and follow response.  Recommended to avoid stimulants and caffeine.  Metoprolol was continued once a day due to hypotension.  EKG:  EKG is  ordered today.  The ekg ordered today demonstrates sinus bradycardia, rate 52 bpm, right bundle branch block (old), left anterior fascicular block  Recent Labs: 12/13/2021: ALT 8; BUN 50; Creatinine, Ser 1.28; Hemoglobin 9.4; Platelets 188.0; Potassium 4.3; Sodium 136; TSH 0.81  Recent Lipid Panel    Component Value Date/Time   CHOL 145 12/13/2021 0926   TRIG 85.0 12/13/2021 0926   HDL 71.60 12/13/2021 0926    CHOLHDL 2 12/13/2021 0926   VLDL 17.0 12/13/2021 0926   LDLCALC 56 12/13/2021 0926   LDLDIRECT 180.2 07/17/2013 0828    Home Medications   Current Meds  Medication Sig   acetaminophen (TYLENOL) 500 MG tablet Take 2 tablets (1,000 mg total) by mouth every 8 (eight) hours.   aspirin EC 81 MG tablet Take 81 mg by mouth daily. Swallow whole.   Calcium-Magnesium-Vitamin D (CALCIUM 1200+D3 PO) Take 1 tablet by mouth daily.   enalapril-hydrochlorothiazide (VASERETIC) 10-25 MG tablet Take 1 tablet by mouth daily. for blood pressure.   levothyroxine (SYNTHROID) 88 MCG tablet Take 1 tablet by mouth every morning on an empty stomach with water only.  No food or other medications for 30 minutes.   metoprolol tartrate (LOPRESSOR) 25 MG tablet TAKE 1 TABLET BY MOUTH EVERY EVENING   Multiple Vitamin (MULTIVITAMIN PO) Take  1 tablet by mouth daily.   simvastatin (ZOCOR) 20 MG tablet Take 1 tablet (20 mg total) by mouth every evening. for cholesterol.   traMADol (ULTRAM) 50 MG tablet Take 1 tablet (50 mg total) by mouth daily as needed for severe pain.     Review of Systems      All other systems reviewed and are otherwise negative except as noted above.  Physical Exam    VS:  BP 118/74   Pulse (!) 52   Ht 5' (1.524 m)   Wt 153 lb 12.8 oz (69.8 kg)   SpO2 99%   BMI 30.04 kg/m  , BMI Body mass index is 30.04 kg/m.  Wt Readings from Last 3 Encounters:  02/15/22 153 lb 12.8 oz (69.8 kg)  12/14/21 157 lb (71.2 kg)  12/13/21 157 lb (71.2 kg)     GEN: Well nourished, well developed, in no acute distress. HEENT: normal. Neck: Supple, no JVD, carotid bruits, or masses. Cardiac: sinus bradycardia, no murmurs, rubs, or gallops. No clubbing, cyanosis, edema.  Radials/PT 2+ and equal bilaterally.  Respiratory:  Respirations regular and unlabored, clear to auscultation bilaterally. GI: Soft, nontender, nondistended. MS: No deformity or atrophy. Skin: Warm and dry, no rash. Neuro:  Strength and  sensation are intact. Psych: Normal affect.  Assessment & Plan    Preop Clearance  Ms. Gipson perioperative risk of a major cardiac event is 0.4% according to the Revised Cardiac Risk Index (RCRI).  Therefore, she is at low risk for perioperative complications.   Her functional capacity is poor at 3.97 METs according to the [facility] Activity Status Index (DASI). Recommendations: Because she does not meet the minimum METS requirement for clearance, will have to discuss patient with Dr. Harrington Paul and potentially pursue further testing before clearance can be granted. Antiplatelet and/or Anticoagulation Recommendations: Aspirin can be held for 7 days prior to her surgery.  Please resume Aspirin post operatively when it is felt to be safe from a bleeding standpoint.   Palpitations -well controlled on metoprolol 25 mg at night -Bradycardia today but patient states her HR is usually in the 60s  3. Essential hypertension -stays well controlled -continue current medication regimen  4. CAD without chest pain -no chest pains or SOB  5. Hyperlipidemia -primary has been looking after her cholesterol levels          Disposition: Follow up as needed with Dorris Carnes, MD or APP.  Signed, Elgie Collard, PA-C 02/15/2022, 8:37 AM [place]

## 2022-02-15 ENCOUNTER — Encounter: Payer: Self-pay | Admitting: Physician Assistant

## 2022-02-15 ENCOUNTER — Other Ambulatory Visit: Payer: Self-pay | Admitting: Primary Care

## 2022-02-15 ENCOUNTER — Telehealth: Payer: Self-pay | Admitting: *Deleted

## 2022-02-15 ENCOUNTER — Encounter: Payer: Self-pay | Admitting: *Deleted

## 2022-02-15 ENCOUNTER — Ambulatory Visit (INDEPENDENT_AMBULATORY_CARE_PROVIDER_SITE_OTHER): Payer: PPO | Admitting: Physician Assistant

## 2022-02-15 VITALS — BP 118/74 | HR 52 | Ht 60.0 in | Wt 153.8 lb

## 2022-02-15 DIAGNOSIS — Z01810 Encounter for preprocedural cardiovascular examination: Secondary | ICD-10-CM

## 2022-02-15 DIAGNOSIS — E039 Hypothyroidism, unspecified: Secondary | ICD-10-CM

## 2022-02-15 DIAGNOSIS — I2510 Atherosclerotic heart disease of native coronary artery without angina pectoris: Secondary | ICD-10-CM

## 2022-02-15 DIAGNOSIS — E785 Hyperlipidemia, unspecified: Secondary | ICD-10-CM | POA: Diagnosis not present

## 2022-02-15 DIAGNOSIS — R002 Palpitations: Secondary | ICD-10-CM | POA: Diagnosis not present

## 2022-02-15 DIAGNOSIS — Z01818 Encounter for other preprocedural examination: Secondary | ICD-10-CM

## 2022-02-15 DIAGNOSIS — I10 Essential (primary) hypertension: Secondary | ICD-10-CM

## 2022-02-15 NOTE — Telephone Encounter (Signed)
This does not come back to pre op. Once the pt has completed testing by ordering provider, the provider will make note if pt has been cleared and will fax to requesting office the clearance notes.

## 2022-02-15 NOTE — Addendum Note (Signed)
Addended by: Juventino Slovak on: 02/15/2022 01:22 PM   Modules accepted: Orders

## 2022-02-15 NOTE — Patient Instructions (Addendum)
Medication Instructions:  Your physician recommends that you continue on your current medications as directed. Please refer to the Current Medication list given to you today.  *If you need a refill on your cardiac medications before your next appointment, please call your pharmacy*   Lab Work: None If you have labs (blood work) drawn today and your tests are completely normal, you will receive your results only by: [place] (if you have MyChart) OR A paper copy in the mail If you have any lab test that is abnormal or we need to change your treatment, we will call you to review the results.  Follow-Up: At [facility], you and your health needs are our priority.  As part of our continuing mission to provide you with exceptional heart care, we have created designated Provider Care Teams.  These Care Teams include your primary Cardiologist (physician) and Advanced Practice Providers (APPs -  Physician Assistants and Nurse Practitioners) who all work together to provide you with the care you need, when you need it.  Your next appointment:   Follow up as needed  Important Information About [place]

## 2022-02-15 NOTE — Telephone Encounter (Signed)
Spoke with patient and made her aware of the need for a lexiscan myoview prior to providing cardiac clearance for pre-op per Dr Harrington Challenger and Nicholes Rough, PA-C. Patient agrees to proceed and I have provided her with instructions as well as sent them to her via her MyChart. Order has been placed and patient has been informed that she will be contacted by someone from our office to schedule. She verbalized her understanding and agrees with this plan.

## 2022-02-15 NOTE — Telephone Encounter (Signed)
   Pre-operative Risk Assessment    Patient Name: Kathleen Paul  DOB: 04/27/1936 MRN: [id number]      Request for Surgical Clearance    Procedure:   LEFT REVERSE TOTAL SHOULDER REPLACEMENT   Date of Surgery:  Clearance TBD                                 Surgeon:  DR. Ophelia Charter Surgeon's Group or Practice Name:  [facility] Phone number:  [phone] EXT [id number] Fax number:  [phone]   Type of Clearance Requested:   - Medical ; ASA    Type of Anesthesia:  General WITH INTERSCALENE BLOCK    Additional requests/questions:    [name]   02/15/2022, 8:57 AM

## 2022-02-16 NOTE — Telephone Encounter (Signed)
   Patient Name: Kathleen Paul  DOB: 26-Jun-1936 MRN: [id number]  Primary Cardiologist: Dorris Carnes, MD  Chart reviewed as part of pre-operative protocol coverage. Patient was already seen 02/15/22 by Nicholes Rough, PA-C for pre-op cardiovascular examination at which time further testing is planned. Per office protocol, the provider who saw the patient should forward their finalized clearance decision to requesting party below upon completion of testing.   I will route this message to requesting party so they're aware that final recommendations are forthcoming from evaluating provider. I remove this message from the pre-op box as separate preop APP input is not needed at this time.  Charlie Pitter, PA-C 02/16/2022, 8:18 AM

## 2022-02-22 NOTE — Addendum Note (Signed)
Addended by: Elgie Collard on: 02/22/2022 01:39 PM   Modules accepted: Orders

## 2022-03-21 ENCOUNTER — Encounter (HOSPITAL_COMMUNITY): Payer: Self-pay | Admitting: *Deleted

## 2022-03-21 ENCOUNTER — Telehealth (HOSPITAL_COMMUNITY): Payer: Self-pay | Admitting: *Deleted

## 2022-03-21 NOTE — Telephone Encounter (Signed)
Left message on voicemail per DPR in reference to upcoming appointment scheduled on 03/29/22 at 0815 with detailed instructions given per Myocardial Perfusion Study Information Sheet for the test. LM to arrive 15 minutes early, and that it is imperative to arrive on time for appointment to keep from having the test rescheduled. If you need to cancel or reschedule your appointment, please call the office within 24 hours of your appointment. Failure to do so may result in a cancellation of your appointment, and a $50 no show fee. Phone number given for call back for any questions. Patrcia Schnepp, Ranae [place]

## 2022-03-29 ENCOUNTER — Encounter (HOSPITAL_COMMUNITY): Payer: PPO

## 2022-03-30 ENCOUNTER — Telehealth (HOSPITAL_COMMUNITY): Payer: Self-pay | Admitting: Physician Assistant

## 2022-03-30 NOTE — Telephone Encounter (Signed)
Patient called and cancelled Myoview for the reason below:  03/28/2022 8:27 AM [id number], SHAWANA A  Cancel Rsn: Patient (sick)   Order will be removed and if patient calls back to reschedule we will reinstate the order. Thank you

## 2022-05-03 ENCOUNTER — Other Ambulatory Visit: Payer: Self-pay | Admitting: Primary Care

## 2022-05-03 DIAGNOSIS — M170 Bilateral primary osteoarthritis of knee: Secondary | ICD-10-CM

## 2022-05-03 MED ORDER — TRAMADOL HCL 50 MG PO TABS: 50.0000 mg | ORAL_TABLET | Freq: Every day | ORAL | 0 refills | Status: DC | PRN

## 2022-05-10 ENCOUNTER — Other Ambulatory Visit: Payer: Self-pay | Admitting: Primary Care

## 2022-05-10 DIAGNOSIS — M170 Bilateral primary osteoarthritis of knee: Secondary | ICD-10-CM

## 2022-05-10 NOTE — Telephone Encounter (Signed)
Patient notified refill was sent in 05/03/22. She will call pharmacy and confirm she can pick up prescription.

## 2022-05-10 NOTE — Telephone Encounter (Signed)
Please call patient, notify her that I sent a refill of her tramadol on 05/03/2022.  I received another refill request today, 05/10/2022.  Did she pick up her prescription from [place]?

## 2022-05-10 NOTE — Telephone Encounter (Signed)
Noted, Rx for Tramadol 50 mg resent to pharmacy.

## 2022-05-10 NOTE — Telephone Encounter (Signed)
Received another refill request from pharmacy for tramadol.  Called [facility] and spoke to Education administrator, he states they never received the prescription refill on 10/25. He is unsure why this was never received on there end.

## 2022-05-16 ENCOUNTER — Other Ambulatory Visit: Payer: Self-pay | Admitting: Primary Care

## 2022-05-16 DIAGNOSIS — I10 Essential (primary) hypertension: Secondary | ICD-10-CM

## 2022-05-26 ENCOUNTER — Telehealth: Payer: Self-pay

## 2022-05-26 NOTE — Progress Notes (Signed)
    Chronic Care Management Pharmacy Assistant   Name: Kathleen Paul  MRN: [id number] DOB: 1935-09-23  Reason for Encounter: CCM (Appointment Reminder)  Medications: Outpatient Encounter Medications as of 05/26/2022  Medication Sig   acetaminophen (TYLENOL) 500 MG tablet Take 2 tablets (1,000 mg total) by mouth every 8 (eight) hours.   aspirin EC 81 MG tablet Take 81 mg by mouth daily. Swallow whole.   Calcium-Magnesium-Vitamin D (CALCIUM 1200+D3 PO) Take 1 tablet by mouth daily.   enalapril-hydrochlorothiazide (VASERETIC) 10-25 MG tablet TAKE 1 TABLET BY MOUTH DAILY FOR BLOOD PRESSURE   levothyroxine (SYNTHROID) 88 MCG tablet Take 1 tablet by mouth every morning on an empty stomach with water only.  No food or other medications for 30 minutes.   metoprolol tartrate (LOPRESSOR) 25 MG tablet TAKE 1 TABLET BY MOUTH EVERY EVENING   Multiple Vitamin (MULTIVITAMIN PO) Take 1 tablet by mouth daily.   simvastatin (ZOCOR) 20 MG tablet Take 1 tablet (20 mg total) by mouth every evening. for cholesterol.   traMADol (ULTRAM) 50 MG tablet Take 1 tablet (50 mg total) by mouth daily as needed. For moderate pain.   No facility-administered encounter medications on file as of 05/26/2022.   Kathleen Paul was contacted to remind of upcoming telephone visit with [name] on 05/31/2022 at 10:15. Patient was reminded to have any blood glucose and blood pressure readings available for review at appointment.   Message was left reminding patient of appointment.  CCM referral has been placed prior to visit?  No   Star Rating Drugs: Medication:  Last Fill: Day Supply Simvastatin 20 mg 05/16/2022 [place], CPP notified  [name], [place] Clinical Pharmacy Assistant [phone]

## 2022-05-31 ENCOUNTER — Telehealth: Payer: PPO

## 2022-05-31 ENCOUNTER — Ambulatory Visit: Payer: PPO | Admitting: Pharmacist

## 2022-05-31 ENCOUNTER — Telehealth: Payer: Self-pay | Admitting: Pharmacist

## 2022-05-31 DIAGNOSIS — I10 Essential (primary) hypertension: Secondary | ICD-10-CM

## 2022-05-31 DIAGNOSIS — I2510 Atherosclerotic heart disease of native coronary artery without angina pectoris: Secondary | ICD-10-CM

## 2022-05-31 DIAGNOSIS — E785 Hyperlipidemia, unspecified: Secondary | ICD-10-CM

## 2022-05-31 DIAGNOSIS — E039 Hypothyroidism, unspecified: Secondary | ICD-10-CM

## 2022-05-31 DIAGNOSIS — N1832 Chronic kidney disease, stage 3b: Secondary | ICD-10-CM

## 2022-05-31 MED ORDER — HYDROCHLOROTHIAZIDE 25 MG PO TABS: 25.0000 mg | ORAL_TABLET | Freq: Every day | ORAL | 1 refills | Status: DC

## 2022-05-31 MED ORDER — ENALAPRIL MALEATE 10 MG PO TABS: 10.0000 mg | ORAL_TABLET | Freq: Every day | ORAL | 1 refills | Status: DC

## 2022-05-31 NOTE — Telephone Encounter (Signed)
Called and notified patient, she will pick up from pharmacy.

## 2022-05-31 NOTE — Telephone Encounter (Signed)
Please notify patient that I sent prescriptions for enalapril 10 mg and hydrochlorothiazide 25 mg to [facility].

## 2022-05-31 NOTE — Telephone Encounter (Signed)
Patient reports [facility] has not been able to refill enalapril-HCTZ because it is on backorder, the pharmacy has been trying to order it for weeks and it has not come in. She has not had success trying to find it at other pharmacies.  The individual components of enalapril and HCTZ are available, I recommend to order enalapril 10 mg and HCTZ 25 mg to [facility].  Routing to PCP.  Preferred pharmacy: [facility] [place], [place] - [address] [place] [address] Phone: [phone] Fax: [phone]

## 2022-05-31 NOTE — Patient Instructions (Signed)
Visit Information  Phone number for Pharmacist: [phone]   Goals Addressed   None     Care Plan : [place]  Updates made by Charlton Haws, RPH since 05/31/2022 12:00 AM     Problem: Hypertension, Hyperlipidemia, Coronary Artery Disease, Chronic Kidney Disease, Hypothyroidism, and Osteoarthritis   Priority: High     Long-Range Goal: Disease mgmt   Start Date: 06/01/2021  Expected End Date: 05/31/2022  This Visit's Progress: On track  Recent Progress: On track  Priority: High  Note:   Current Barriers:  None identified  Pharmacist Clinical Goal(s):  Patient will contact provider office for questions/concerns as evidenced notation of same in electronic health record through collaboration with PharmD and provider.   Interventions: 1:1 collaboration with [name], NP regarding development and update of comprehensive plan of care as evidenced by provider attestation and co-signature Inter-disciplinary care team collaboration (see longitudinal plan of care) Comprehensive medication review performed; medication list updated in electronic medical record  Hypertension / CKD stage 3b(BP goal <130/80) -Controlled - BP is at goal; pt endorses compliance with medications, however she has not been able to refill enalapril-HCTZ due to backorder, she reports she has about a week left -follows with nephrology -Current home readings: 123/75, 67 -Current treatment: Enalapril-HCTZ 10-25 mg daily AM - Appropriate, Effective, Safe, Query accessible Metoprolol tartrate 25 mg daily PM -Appropriate, Effective, Safe, Accessible -Denies hypotensive/hypertensive symptoms -Educated on BP goals and benefits of medications for prevention of heart attack, stroke and kidney damage; Daily salt intake goal < 2300 mg; Importance of home blood pressure monitoring; -Counseled to monitor BP at home daily -Recommended to change enalapril/HCTZ to individual components -  enalapril 10 mg and HCTZ 25 mg; see phone note  Hyperlipidemia / CAD: (LDL goal < 70) -Controlled - LDL is at goal; pt endorses compliance with statin and aspirin; she doe not endorse bleeding issues -Current treatment: Simvastatin 20 mg daily HS -Appropriate, Effective, Safe, Accessible Aspirin 81 mg daily -Appropriate, Effective, Safe, Accessible -Educated on Cholesterol goals; Benefits of statin for ASCVD risk reduction; -Recommended to continue current medication  Osteoarthritis (Goal: manage pain) -Not ideally controlled - pt reports shoulder and knee pain is debilitating at times; she does not take pain medication every day (either tylenol or tramadol); she reports she has seen ortho previously, had injections but these did not help for long; she has been told not to take NSAIDS due to kidney disease -OA of both knees, shoulders, hands, feet -Hx 3 knee surgeries. Has seen rheumatology previously -Current treatment  Tylenol 500 mg PRN -2 tab at a time; not every day -Appropriate, Effective, Safe, Accessible Tramadol 50 mg daily PRN -Appropriate, Effective, Safe, Accessible -Medications previously tried: voltaren, aspercreme  -Counseled on max daily tylenol dose 3000 mg/day; advised she can use more tylenol than she has been using if needed -Recommended to continue current medication  Hypothyroidism (Goal: maintain TSH in goal range) -Controlled - pt reports taking levothyroxine first thing in AM and waiting an hour before eating -Current treatment  Levothyroxine 88 mcg daily AM -Appropriate, Effective, Safe, Accessible -Recommended to continue current medication  Chronic Kidney Disease Stage 3b  -All medications assessed for renal dosing and appropriateness in chronic kidney disease. -Recommended to continue current medication  Health Maintenance -Vaccine gaps: Shingrix -Pt has declined DEXA scan multiple times -Pt has decided to defer shoulder surgery as she experienced relief  with injections -Current therapy:  Multivitamin Calcium-Mg-Vitamin D BID -Patient is satisfied with  current therapy and denies issues -Recommended to continue current medication  Patient Goals/Self-Care Activities Patient will:  - take medications as prescribed as evidenced by patient report and record review focus on medication adherence by pill box check blood pressure daily, document, and provide at future appointments       Patient verbalizes understanding of instructions and care plan provided today and agrees to view in [place]. Active MyChart status and patient understanding of how to access instructions and care plan via MyChart confirmed with patient.    Telephone follow up appointment with pharmacy team member scheduled for: PRN  [name], PharmD, BCACP Clinical Pharmacist [facility] at [facility] [phone]

## 2022-05-31 NOTE — Progress Notes (Signed)
Chronic Care Management Pharmacy Note  05/31/2022 Name:  Kathleen Paul MRN:  [id number] DOB:  [birth year]  Summary: -Pt reports compliance with medications as prescribed -Enalapril-HCTZ is on backorder and pt will run out in a week -Pt decided to defer shoulder surgery, she had relief with injections  Recommendations/Changes made from today's visit: -Change enalapril/HCTZ to individual components - enalapril 10 mg and HCTZ 25 mg (see phone note)  Plan: -[name] will call patient in 6 months for general update -Pharmacist follow up PRN -PCP annual due 12/2022     Subjective: Kathleen Paul is an 86 y.o. year old female who is a primary patient of [name], NP.  The CCM team was consulted for assistance with disease management and care coordination needs.    Engaged with patient by telephone for follow up visit in response to provider referral for pharmacy case management and/or care coordination services.   Consent to Services:  The patient was given information about Chronic Care Management services, agreed to services, and gave verbal consent prior to initiation of services.  Please see initial visit note for detailed documentation.   Patient Care Team: [name], NP as PCP - General (Internal Medicine) Fay Records, MD as PCP - Cardiology (Cardiology) Kathleen Paul, [facility] as Pharmacist (Pharmacist)  Patient lives alone, her daughter lives a few blocks away - she stays a few nights a week. 4 children total, 8 grandchildren, 3 great grandchildren. She uses a walker to get around.  Recent office visits: 12/13/21 NP [name] OV: f/u - worsening anemia. Order hemoccult.   03/24/2021 - Alma Friendly, NP - Video Visit - Follow up due to primary osteoarthritis of both knees. Order placed for sleeper lift chair.   12/22/2020 - Alma Friendly, NP - Patient presented for Annual Wellness Visit. Labs: CBC, CMP, Lipid and TSH. Change: traMADol  from BID to daily PRN. Stop due to complete: omeprazole. Repeat TSH 6 weeks due to possible overtreatment.  12/13/2020 Andrez Grime, LPN - Patient presented for medicare wellness. No medication changes.   Recent consult visits: 02/15/22 PA Nicholes Rough (CT Surgery): pre-op exam - ordered stress test and perfusion imaging (still not performed as of 05/31/22)  Hospital visits: None in previous 6 months   Objective:  Lab Results  Component Value Date   CREATININE 1.28 (H) 12/13/2021   BUN 50 (H) 12/13/2021   GFR 38.58 (L) 12/13/2021   GFRNONAA 49 (L) 07/02/2019   GFRAA 56 (L) 07/02/2019   NA 136 12/13/2021   K 4.3 12/13/2021   CALCIUM 9.0 12/13/2021   CO2 26 12/13/2021   GLUCOSE 85 12/13/2021    Lab Results  Component Value Date/Time   HGBA1C 5.5 08/18/2019 10:39 AM   HGBA1C 5.7 01/28/2019 08:35 AM   GFR 38.58 (L) 12/13/2021 09:26 AM   GFR 34.30 (L) 12/22/2020 09:28 AM    Last diabetic Eye exam: No results found for: "HMDIABEYEEXA"  Last diabetic Foot exam: No results found for: "HMDIABFOOTEX"   Lab Results  Component Value Date   CHOL 145 12/13/2021   HDL 71.60 12/13/2021   LDLCALC 56 12/13/2021   LDLDIRECT 180.2 07/17/2013   TRIG 85.0 12/13/2021   CHOLHDL 2 12/13/2021       Latest Ref Rng & Units 12/13/2021    9:26 AM 12/22/2020    9:28 AM 12/30/2019    3:07 PM  Hepatic Function  Total Protein 6.0 - 8.3 g/dL 6.6  7.3  Albumin 3.5 - 5.2 g/dL 4.3  4.7  3.9   AST 0 - 37 U/L 17  18    ALT 0 - 35 U/L 8  9    Alk Phosphatase 39 - 117 U/L 70  62    Total Bilirubin 0.2 - 1.2 mg/dL 0.9  0.8      Lab Results  Component Value Date/Time   TSH 0.81 12/13/2021 09:26 AM   TSH 3.97 02/04/2021 08:12 AM   FREET4 1.33 07/17/2013 08:28 AM   FREET4 1.42 01/13/2013 08:21 AM       Latest Ref Rng & Units 12/13/2021    9:26 AM 12/22/2020    9:28 AM 12/30/2019    8:19 AM  CBC  WBC 4.0 - 10.5 K/uL 4.8  6.8  4.3   Hemoglobin 12.0 - 15.0 g/dL 9.4  11.1  9.8   Hematocrit  36.0 - 46.0 % 28.4  33.4  29.1   Platelets 150.0 - 400.0 K/uL 188.0  189.0  227.0    Iron/TIBC/Ferritin/ %Sat    Component Value Date/Time   IRON 120 12/19/2021 0731   TIBC 491.4 (H) 12/19/2021 0731   FERRITIN 15.0 12/19/2021 0731   IRONPCTSAT 24.4 12/19/2021 0731   IRONPCTSAT 30 11/28/2017 0926    No results found for: "VD25OH"  Clinical ASCVD: Yes  The ASCVD Risk score (Arnett DK, et al., 2019) failed to calculate for the following reasons:   The 2019 ASCVD risk score is only valid for ages 38 to 73       12/14/2021    1:15 PM 12/13/2021    8:57 AM 12/13/2020    1:28 PM  Depression screen PHQ 2/9  Decreased Interest 0 0 0  Down, Depressed, Hopeless 0 0 0  PHQ - 2 Score 0 0 0  Altered sleeping 0 0 0  Tired, decreased energy 0 0 0  Change in appetite 0 0 0  Feeling bad or failure about yourself  0 0 0  Trouble concentrating 0 0 0  Moving slowly or fidgety/restless 0 0 0  Suicidal thoughts 0 0 0  PHQ-9 Score 0 0 0  Difficult doing work/chores  Not difficult at all Not difficult at all    Social History   Tobacco Use  Smoking Status Former   Packs/day: 0.50   Years: 30.00   Total pack years: 15.00   Types: Cigarettes   Quit date: 07/10/1988   Years since quitting: 33.9  Smokeless Tobacco Never   BP Readings from Last 3 Encounters:  02/15/22 118/74  12/13/21 140/60  06/30/21 120/80   Pulse Readings from Last 3 Encounters:  02/15/22 (!) 52  12/13/21 (!) 48  06/30/21 (!) 57   Wt Readings from Last 3 Encounters:  02/15/22 153 lb 12.8 oz (69.8 kg)  12/14/21 157 lb (71.2 kg)  12/13/21 157 lb (71.2 kg)   BMI Readings from Last 3 Encounters:  02/15/22 30.04 kg/m  12/14/21 30.66 kg/m  12/13/21 30.66 kg/m    Assessment/Interventions: Review of patient past medical history, allergies, medications, health status, including review of consultants reports, laboratory and other test data, was performed as part of comprehensive evaluation and provision of chronic care  management services.   SDOH:  (Social Determinants of Health) assessments and interventions performed: Yes SDOH Interventions    Flowsheet Row Clinical Support from 12/14/2021 in [place] at [place] from 12/13/2020 in [place] at [place] from 11/05/2019 in [place] at [facility]  SDOH Interventions     Food Insecurity Interventions Intervention Not Indicated -- --  Housing Interventions Intervention Not Indicated -- --  Transportation Interventions Intervention Not Indicated -- --  Depression Interventions/Treatment  -- PHQ2-9 Score <4 Follow-up Not Indicated PHQ2-9 Score <4 Follow-up Not Indicated  Financial Strain Interventions Intervention Not Indicated -- --  Physical Activity Interventions Intervention Not Indicated -- --  Stress Interventions Intervention Not Indicated -- --  Social Connections Interventions Intervention Not Indicated -- --      SDOH Screenings   Food Insecurity: No Food Insecurity (12/14/2021)  Housing: Low Risk  (12/14/2021)  Transportation Needs: No Transportation Needs (12/14/2021)  Alcohol Screen: Low Risk  (12/14/2021)  Depression (PHQ2-9): Low Risk  (12/14/2021)  Financial Resource Strain: Low Risk  (12/14/2021)  Physical Activity: Sufficiently Active (12/14/2021)  Social Connections: Moderately Isolated (12/14/2021)  Stress: No Stress Concern Present (12/14/2021)  Tobacco Use: Medium Risk (02/15/2022)    CCM Care Plan  Allergies  Allergen Reactions   Codeine Nausea Only    Medications Reviewed Today     Reviewed by Miguel Aschoff (Physician Assistant Certified) on 70/17/79 at [phone]  Med List Status: <None>   Medication Order Taking? Sig Documenting Provider Last Dose Status Informant  acetaminophen (TYLENOL) 500 MG tablet [id number] Yes Take 2 tablets (1,000 mg total) by mouth every 8 (eight) hours. Danae Orleans, PA-C Taking Active   aspirin EC 81 MG tablet [id number] Yes Take 81 mg by  mouth daily. Swallow whole. [provider] Taking Active   Calcium-Magnesium-Vitamin D (CALCIUM 1200+D3 PO) [id number] Yes Take 1 tablet by mouth daily. [provider] Taking Active Self  enalapril-hydrochlorothiazide (VASERETIC) 10-25 MG tablet [id number] Yes Take 1 tablet by mouth daily. for blood pressure. [name], NP Taking Active   levothyroxine (SYNTHROID) 88 MCG tablet [id number] Yes Take 1 tablet by mouth every morning on an empty stomach with water only.  No food or other medications for 30 minutes. [name], NP Taking Active   metoprolol tartrate (LOPRESSOR) 25 MG tablet [id number] Yes TAKE 1 TABLET BY MOUTH EVERY EVENING [name], NP Taking Active   Multiple Vitamin (MULTIVITAMIN PO) [id number] Yes Take 1 tablet by mouth daily. [provider] Taking Active Self           Med Note Caryn Section, [place] A   Fri May 30, 2019  3:48 PM)    simvastatin (ZOCOR) 20 MG tablet [id number] Yes Take 1 tablet (20 mg total) by mouth every evening. for cholesterol. [name], NP Taking Active   traMADol Veatrice Bourbon) 50 MG tablet [id number] Yes Take 1 tablet (50 mg total) by mouth daily as needed for severe pain. [name], NP Taking Active             Patient Active Problem List   Diagnosis Date Noted   Mechanical complication of knee prosthesis, sequela 03/03/2020   Preventative health care 11/19/2019   Patellar tendon rupture, left, subsequent encounter 07/01/2019   Overweight (BMI 25.0-29.9) 06/04/2019   Rupture patellar tendon, left, subsequent encounter 06/03/2019   Osteoarthritis of right knee 05/18/2019   Rotator cuff arthropathy of left shoulder 04/18/2019   Rotator cuff arthropathy of right shoulder 04/18/2019   Bilateral shoulder pain 04/17/2019   Status post total left knee replacement 02/11/2019   Osteoarthritis of left knee 01/16/2019   Food impaction of esophagus 04/02/2018   Primary osteoarthritis of both hands  12/24/2017   Primary osteoarthritis of both knees 12/24/2017  Primary osteoarthritis of both feet 12/24/2017   Osteoarthritis of both shoulders 12/24/2017   Gastroesophageal reflux disease 08/30/2017   GAD (generalized anxiety disorder) 09/05/2016   Renal insufficiency 07/17/2013   Suprapubic mass 08/20/2012   Hypothyroidism    UI (urinary incontinence)    Stricture and stenosis of esophagus 04/11/2011   Chest pain 03/29/2011   Dyslipidemia 03/29/2011   Hypertension 03/29/2011   CAD (coronary artery disease) 03/29/2011   Palpitations 03/29/2011    Immunization History  Administered Date(s) Administered   Influenza Split 04/02/2012   Influenza, High Dose Seasonal PF 04/16/2017   Influenza,inj,Quad PF,6+ Mos 04/29/2013, 04/18/2018, 04/29/2019, 04/19/2020   Influenza-Unspecified 04/30/2016   PFIZER(Purple Top)SARS-COV-2 Vaccination 09/24/2019, 10/15/2019   Pneumococcal Conjugate-13 04/29/2019   Pneumococcal Polysaccharide-23 04/18/2018    Conditions to be addressed/monitored:  Hypertension, Hyperlipidemia, Coronary Artery Disease, Chronic Kidney Disease, Hypothyroidism, and Osteoarthritis  Care Plan : Kathleen Paul  Updates made by Kathleen Paul, [place] since 05/31/2022 12:00 AM     Problem: Hypertension, Hyperlipidemia, Coronary Artery Disease, Chronic Kidney Disease, Hypothyroidism, and Osteoarthritis   Priority: High     Long-Range Goal: Disease mgmt   Start Date: 06/01/2021  Expected End Date: 05/31/2022  This Visit's Progress: On track  Recent Progress: On track  Priority: High  Note:   Current Barriers:  None identified  Pharmacist Clinical Goal(s):  Patient will contact provider office for questions/concerns as evidenced notation of same in electronic health record through collaboration with PharmD and provider.   Interventions: 1:1 collaboration with [name], NP regarding development and update of comprehensive plan of care as  evidenced by provider attestation and co-signature Inter-disciplinary care team collaboration (see longitudinal plan of care) Comprehensive medication review performed; medication list updated in electronic medical record  Hypertension / CKD stage 3b(BP goal <130/80) -Controlled - BP is at goal; pt endorses compliance with medications, however she has not been able to refill enalapril-HCTZ due to backorder, she reports she has about a week left -follows with nephrology -Current home readings: 123/75, 67 -Current treatment: Enalapril-HCTZ 10-25 mg daily AM - Appropriate, Effective, Safe, Query accessible Metoprolol tartrate 25 mg daily PM -Appropriate, Effective, Safe, Accessible -Denies hypotensive/hypertensive symptoms -Educated on BP goals and benefits of medications for prevention of heart attack, stroke and kidney damage; Daily salt intake goal < 2300 mg; Importance of home blood pressure monitoring; -Counseled to monitor BP at home daily -Recommended to change enalapril/HCTZ to individual components - enalapril 10 mg and HCTZ 25 mg; see phone note  Hyperlipidemia / CAD: (LDL goal < 70) -Controlled - LDL is at goal; pt endorses compliance with statin and aspirin; she doe not endorse bleeding issues -Current treatment: Simvastatin 20 mg daily HS -Appropriate, Effective, Safe, Accessible Aspirin 81 mg daily -Appropriate, Effective, Safe, Accessible -Educated on Cholesterol goals; Benefits of statin for ASCVD risk reduction; -Recommended to continue current medication  Osteoarthritis (Goal: manage pain) -Not ideally controlled - pt reports shoulder and knee pain is debilitating at times; she does not take pain medication every day (either tylenol or tramadol); she reports she has seen ortho previously, had injections but these did not help for long; she has been told not to take NSAIDS due to kidney disease -OA of both knees, shoulders, hands, feet -Hx 3 knee surgeries. Has seen  rheumatology previously -Current treatment  Tylenol 500 mg PRN -2 tab at a time; not every day -Appropriate, Effective, Safe, Accessible Tramadol 50 mg daily PRN -Appropriate, Effective, Safe, Accessible -Medications previously tried:  voltaren, aspercreme  -Counseled on max daily tylenol dose 3000 mg/day; advised she can use more tylenol than she has been using if needed -Recommended to continue current medication  Hypothyroidism (Goal: maintain TSH in goal range) -Controlled - pt reports taking levothyroxine first thing in AM and waiting an hour before eating -Current treatment  Levothyroxine 88 mcg daily AM -Appropriate, Effective, Safe, Accessible -Recommended to continue current medication  Chronic Kidney Disease Stage 3b  -All medications assessed for renal dosing and appropriateness in chronic kidney disease. -Recommended to continue current medication  Health Maintenance -Vaccine gaps: Shingrix -Pt has declined DEXA scan multiple times -Pt has decided to defer shoulder surgery as she experienced relief with injections -Current therapy:  Multivitamin Calcium-Mg-Vitamin D BID -Patient is satisfied with current therapy and denies issues -Recommended to continue current medication  Patient Goals/Self-Care Activities Patient will:  - take medications as prescribed as evidenced by patient report and record review focus on medication adherence by pill box check blood pressure daily, document, and provide at future appointments        Medication Assistance: None required.  Patient affirms current coverage meets needs.  Compliance/Adherence/Medication fill history: Care Gaps: None  Star-Rating Drugs: Enalapril-HCTZ - PDC 95% Simvastatin - PDC 100%  Medication Access: Within the past 30 days, how often has patient missed a dose of medication? 0 Is a pillbox or other method used to improve adherence? Yes  Factors that may affect medication adherence?  Drug supply  issues Are meds synced by current pharmacy? No  Are meds delivered by current pharmacy? No  Does patient experience delays in picking up medications due to transportation concerns? No   [facility] Services Reviewed: Is patient disadvantaged to use [facility]?: No  Current Rx insurance plan: HTA Name and location of Current pharmacy:  [facility] [place] [place] - [address] [place] [address] Phone: [phone] Fax: [phone]  [facility] services reviewed with patient today?: No  Patient requests to transfer care to [facility]?: No  Reason patient declined to change pharmacies: Loyalty to other pharmacy/Patient preference   Care Plan and Follow Up Patient Decision:  Patient agrees to Care Plan and Follow-up.  Plan: The patient has been provided with contact information for the care management team and has been advised to call with any health related questions or concerns.   [name], PharmD, BCACP Clinical Pharmacist [facility] at [facility] [phone]

## 2022-06-11 ENCOUNTER — Emergency Department: Payer: PPO

## 2022-06-11 ENCOUNTER — Observation Stay: Payer: PPO

## 2022-06-11 ENCOUNTER — Other Ambulatory Visit: Payer: Self-pay

## 2022-06-11 ENCOUNTER — Encounter: Payer: Self-pay | Admitting: Emergency Medicine

## 2022-06-11 ENCOUNTER — Observation Stay
Admission: EM | Admit: 2022-06-11 | Discharge: 2022-06-12 | Disposition: A | Discharge status: Home / Self Care | Payer: PPO | Attending: Internal Medicine | Admitting: Internal Medicine

## 2022-06-11 DIAGNOSIS — Z87891 Personal history of nicotine dependence: Secondary | ICD-10-CM | POA: Insufficient documentation

## 2022-06-11 DIAGNOSIS — I2510 Atherosclerotic heart disease of native coronary artery without angina pectoris: Secondary | ICD-10-CM | POA: Diagnosis not present

## 2022-06-11 DIAGNOSIS — G459 Transient cerebral ischemic attack, unspecified: Secondary | ICD-10-CM | POA: Diagnosis not present

## 2022-06-11 DIAGNOSIS — Z85828 Personal history of other malignant neoplasm of skin: Secondary | ICD-10-CM | POA: Diagnosis not present

## 2022-06-11 DIAGNOSIS — E785 Hyperlipidemia, unspecified: Secondary | ICD-10-CM | POA: Diagnosis present

## 2022-06-11 DIAGNOSIS — I10 Essential (primary) hypertension: Secondary | ICD-10-CM | POA: Diagnosis present

## 2022-06-11 DIAGNOSIS — R4182 Altered mental status, unspecified: Secondary | ICD-10-CM | POA: Insufficient documentation

## 2022-06-11 DIAGNOSIS — Z1152 Encounter for screening for COVID-19: Secondary | ICD-10-CM | POA: Diagnosis not present

## 2022-06-11 DIAGNOSIS — K2100 Gastro-esophageal reflux disease with esophagitis, without bleeding: Secondary | ICD-10-CM | POA: Diagnosis not present

## 2022-06-11 DIAGNOSIS — K219 Gastro-esophageal reflux disease without esophagitis: Secondary | ICD-10-CM | POA: Diagnosis present

## 2022-06-11 DIAGNOSIS — R4701 Aphasia: Secondary | ICD-10-CM

## 2022-06-11 DIAGNOSIS — R2990 Unspecified symptoms and signs involving the nervous system: Secondary | ICD-10-CM

## 2022-06-11 DIAGNOSIS — Z7982 Long term (current) use of aspirin: Secondary | ICD-10-CM | POA: Diagnosis not present

## 2022-06-11 DIAGNOSIS — Z86718 Personal history of other venous thrombosis and embolism: Secondary | ICD-10-CM | POA: Insufficient documentation

## 2022-06-11 DIAGNOSIS — Z79899 Other long term (current) drug therapy: Secondary | ICD-10-CM | POA: Insufficient documentation

## 2022-06-11 DIAGNOSIS — Z96652 Presence of left artificial knee joint: Secondary | ICD-10-CM | POA: Insufficient documentation

## 2022-06-11 DIAGNOSIS — N1831 Chronic kidney disease, stage 3a: Secondary | ICD-10-CM | POA: Diagnosis not present

## 2022-06-11 DIAGNOSIS — R002 Palpitations: Secondary | ICD-10-CM | POA: Diagnosis not present

## 2022-06-11 DIAGNOSIS — I6523 Occlusion and stenosis of bilateral carotid arteries: Secondary | ICD-10-CM | POA: Diagnosis not present

## 2022-06-11 DIAGNOSIS — E669 Obesity, unspecified: Secondary | ICD-10-CM | POA: Diagnosis not present

## 2022-06-11 DIAGNOSIS — I129 Hypertensive chronic kidney disease with stage 1 through stage 4 chronic kidney disease, or unspecified chronic kidney disease: Secondary | ICD-10-CM | POA: Diagnosis not present

## 2022-06-11 DIAGNOSIS — E039 Hypothyroidism, unspecified: Secondary | ICD-10-CM | POA: Diagnosis not present

## 2022-06-11 DIAGNOSIS — I6501 Occlusion and stenosis of right vertebral artery: Secondary | ICD-10-CM | POA: Diagnosis not present

## 2022-06-11 LAB — CBC
HCT: 31.8 % — ABNORMAL LOW (ref 36.0–46.0)
Hemoglobin: 10.1 g/dL — ABNORMAL LOW (ref 12.0–15.0)
MCH: 29.4 pg (ref 26.0–34.0)
MCHC: 31.8 g/dL (ref 30.0–36.0)
MCV: 92.7 fL (ref 80.0–100.0)
Platelets: 230 10*3/uL (ref 150–400)
RBC: 3.43 MIL/uL — ABNORMAL LOW (ref 3.87–5.11)
RDW: 15.9 % — ABNORMAL HIGH (ref 11.5–15.5)
WBC: 5.4 10*3/uL (ref 4.0–10.5)
nRBC: 0 % (ref 0.0–0.2)

## 2022-06-11 LAB — DIFFERENTIAL
Abs Immature Granulocytes: 0.43 10*3/uL — ABNORMAL HIGH (ref 0.00–0.07)
Basophils Absolute: 0 10*3/uL (ref 0.0–0.1)
Basophils Relative: 0 %
Eosinophils Absolute: 0.1 10*3/uL (ref 0.0–0.5)
Eosinophils Relative: 1 %
Immature Granulocytes: 8 %
Lymphocytes Relative: 17 %
Lymphs Abs: 0.9 10*3/uL (ref 0.7–4.0)
Monocytes Absolute: 0.8 10*3/uL (ref 0.1–1.0)
Monocytes Relative: 14 %
Neutro Abs: 3.3 10*3/uL (ref 1.7–7.7)
Neutrophils Relative %: 60 %
Smear Review: NORMAL

## 2022-06-11 LAB — COMPREHENSIVE METABOLIC PANEL
ALT: 12 U/L (ref 0–44)
AST: 21 U/L (ref 15–41)
Albumin: 4.3 g/dL (ref 3.5–5.0)
Alkaline Phosphatase: 65 U/L (ref 38–126)
Anion gap: 10 (ref 5–15)
BUN: 50 mg/dL — ABNORMAL HIGH (ref 8–23)
CO2: 25 mmol/L (ref 22–32)
Calcium: 9.4 mg/dL (ref 8.9–10.3)
Chloride: 104 mmol/L (ref 98–111)
Creatinine, Ser: 1.34 mg/dL — ABNORMAL HIGH (ref 0.44–1.00)
GFR, Estimated: 39 mL/min — ABNORMAL LOW (ref >60)
Glucose, Bld: 113 mg/dL — ABNORMAL HIGH (ref 70–99)
Potassium: 4.3 mmol/L (ref 3.5–5.1)
Sodium: 139 mmol/L (ref 135–145)
Total Bilirubin: 0.8 mg/dL (ref 0.3–1.2)
Total Protein: 7.5 g/dL (ref 6.5–8.1)

## 2022-06-11 LAB — APTT: aPTT: 33 seconds (ref 24–36)

## 2022-06-11 LAB — URINALYSIS, ROUTINE W REFLEX MICROSCOPIC
Bilirubin Urine: NEGATIVE
Glucose, UA: NEGATIVE mg/dL
Hgb urine dipstick: NEGATIVE
Ketones, ur: NEGATIVE mg/dL
Leukocytes,Ua: NEGATIVE
Nitrite: NEGATIVE
Protein, ur: NEGATIVE mg/dL
Specific Gravity, Urine: 1.009 (ref 1.005–1.030)
pH: 6 (ref 5.0–8.0)

## 2022-06-11 LAB — RESP PANEL BY RT-PCR (FLU A&B, COVID) ARPGX2
Influenza A by PCR: NEGATIVE
Influenza B by PCR: NEGATIVE
SARS Coronavirus 2 by RT PCR: NEGATIVE

## 2022-06-11 LAB — PROTIME-INR
INR: 1.2 (ref 0.8–1.2)
Prothrombin Time: 14.9 seconds (ref 11.4–15.2)

## 2022-06-11 LAB — ETHANOL: Alcohol, Ethyl (B): <10 mg/dL (ref <10)

## 2022-06-11 MED ORDER — ASPIRIN 81 MG PO CHEW
324.0000 mg | CHEWABLE_TABLET | Freq: Once | ORAL | Status: AC
Start: 2022-06-11 — End: 2022-06-11
  Administered 2022-06-11: 324 mg via ORAL
  Filled 2022-06-11: qty 4

## 2022-06-11 MED ORDER — STROKE: EARLY STAGES OF RECOVERY BOOK: Freq: Once | Status: DC

## 2022-06-11 MED ORDER — LEVOTHYROXINE SODIUM 88 MCG PO TABS
88.0000 ug | ORAL_TABLET | Freq: Every day | ORAL | Status: DC
  Administered 2022-06-12: 88 ug via ORAL
  Filled 2022-06-11: qty 1

## 2022-06-11 MED ORDER — TRAMADOL HCL 50 MG PO TABS
50.0000 mg | ORAL_TABLET | Freq: Every day | ORAL | Status: DC | PRN
  Administered 2022-06-11: 50 mg via ORAL
  Filled 2022-06-11: qty 1

## 2022-06-11 MED ORDER — ACETAMINOPHEN 160 MG/5ML PO SOLN: 650.0000 mg | ORAL | Status: DC | PRN

## 2022-06-11 MED ORDER — SIMVASTATIN 10 MG PO TABS
20.0000 mg | ORAL_TABLET | Freq: Every evening | ORAL | Status: DC
  Administered 2022-06-11: 20 mg via ORAL
  Filled 2022-06-11: qty 2

## 2022-06-11 MED ORDER — ENOXAPARIN SODIUM 30 MG/0.3ML IJ SOSY
30.0000 mg | PREFILLED_SYRINGE | INTRAMUSCULAR | Status: DC
  Administered 2022-06-12: 30 mg via SUBCUTANEOUS
  Filled 2022-06-11: qty 0.3

## 2022-06-11 MED ORDER — SENNOSIDES-DOCUSATE SODIUM 8.6-50 MG PO TABS: 1.0000 | ORAL_TABLET | Freq: Every evening | ORAL | Status: DC | PRN

## 2022-06-11 MED ORDER — HYDRALAZINE HCL 20 MG/ML IJ SOLN
5.0000 mg | INTRAMUSCULAR | Status: DC | PRN
Start: 2022-06-11 — End: 2022-06-12
  Filled 2022-06-11: qty 1

## 2022-06-11 MED ORDER — ACETAMINOPHEN 325 MG RE SUPP: 650.0000 mg | RECTAL | Status: DC | PRN

## 2022-06-11 MED ORDER — ACETAMINOPHEN 325 MG PO TABS: 650.0000 mg | ORAL_TABLET | ORAL | Status: DC | PRN

## 2022-06-11 MED ORDER — ONDANSETRON HCL 4 MG/2ML IJ SOLN: 4.0000 mg | Freq: Three times a day (TID) | INTRAMUSCULAR | Status: DC | PRN

## 2022-06-11 MED ORDER — IOHEXOL 350 MG/ML SOLN
75.0000 mL | Freq: Once | INTRAVENOUS | Status: AC | PRN
  Administered 2022-06-11: 75 mL via INTRAVENOUS

## 2022-06-11 MED ORDER — METOPROLOL TARTRATE 25 MG PO TABS
25.0000 mg | ORAL_TABLET | Freq: Every day | ORAL | Status: DC
  Administered 2022-06-11: 25 mg via ORAL
  Filled 2022-06-11: qty 1

## 2022-06-11 MED ORDER — PANTOPRAZOLE SODIUM 40 MG PO TBEC: 40.0000 mg | DELAYED_RELEASE_TABLET | Freq: Every day | ORAL | Status: DC

## 2022-06-11 NOTE — H&P (Addendum)
History and Physical    Kathleen Paul WYO:[id number] DOB: Sep 05, 1935 DOA: 06/11/2022  Referring MD/NP/PA:   PCP: [name], NP   Patient coming from:  The patient is coming from home.    Chief Complaint: Difficulty speaking.  HPI: Kathleen Paul is a 86 y.o. female with medical history significant of hypertension, hyperlipidemia, hypothyroidism, CAD, mitral valve regurgitation, anemia, remote DVT not on anticoagulation, gastric ulcer, esophageal stricture, palpitation on metoprolol, obesity with BMI 31.25, CKD-3A, who presents with difficulty speaking.  Per her son at the bedside, pt is living her daughter. Last night, pt was noticed to have difficulty speaking and stating strange things but daughter thought that it was because of sleeping interruption.  This morning patient continues to have difficulty speaking with slurred speech.  Patient has difficulty wording out.  No unilateral numbness or tingling in extremities, no facial droop or slurred speech.  No vision loss or hearing loss.  Her symptom has resolved in ED.  Patient does not chest pain, cough, shortness breath.  No fever or chills.  No nausea vomiting, diarrhea or abdominal pain pain or symptoms of UTI.  Patient states that she is easy to bruise when she took baby aspirin.  Patient received total of 324 mg of aspirin today.  Data reviewed independently and ED Course: pt was found to have WBC 5.4, INR 1.2, PTT 33, renal function close to baseline, temperature normal, blood pressure 179/90, heart rate 110, 80, RR 20, oxygen saturation 100% on room air.  CT of head is negative for acute intracranial abnormalities.   EKG: I have personally reviewed.  Sinus rhythm, QTc 467, bifascicular block, frequent PVC, early R wave progression   Review of Systems:   General: no fevers, chills, no body weight gain, fatigue HEENT: no blurry vision, hearing changes or sore throat Respiratory: no dyspnea, coughing, wheezing CV: no  chest pain, no palpitations GI: no nausea, vomiting, abdominal pain, diarrhea, constipation GU: no dysuria, burning on urination, increased urinary frequency, hematuria  Ext: no leg edema Neuro: no unilateral weakness, numbness, or tingling, no vision change or hearing loss Skin: no rash, no skin tear.  Has bruise in her right hand MSK: No muscle spasm, no deformity, no limitation of range of movement in spin Heme: No easy bruising.  Travel history: No recent long distant travel.   Allergy:  Allergies  Allergen Reactions   Codeine Nausea Only    Past Medical History:  Diagnosis Date   Anxiety state, unspecified    Arthritis    Atherosclerosis    Cancer ([place])    skin cancer   Diverticulosis    Esophageal stricture    Essential hypertension, benign    Gastric ulcer    Hiatal hernia    History of DVT (deep vein thrombosis) yrs ago   legs   IDA (iron deficiency anemia)    Inguinal hernia without mention of obstruction or gangrene, unilateral or unspecified, (not specified as recurrent)    2014   Internal hemorrhoids    Mild mitral regurgitation    a. 04/2010 Echo: nl EF, mild diast dysfxn, trace PR, mild TR, mild-mod MR.   Non-obstructive CAD    a. 04/2010 Myoview: EF 77%, fixed apical/inferior defect;  b. 04/2010 Cath: LAD 3m LCX/RCA minor irregs, EF 60%;  c. 04/2011 Myoview: low risk.   Pure hypercholesterolemia    Rupture of left patellar tendon    05-26-2019   Scoliosis    UI (urinary incontinence)  Umbilical hernia without mention of obstruction or gangrene    2014   Unspecified hypothyroidism    Vitamin D deficiency     Past Surgical History:  Procedure Laterality Date   ABDOMINAL HYSTERECTOMY  2008   complete    BIOPSY  04/02/2018   Procedure: BIOPSY;  Surgeon: Irving Copas., MD;  Location: [facility];  Service: Gastroenterology;;   CARDIAC CATHETERIZATION  2011   [facility]   COLONOSCOPY  2003    [place]   ESOPHAGOGASTRODUODENOSCOPY  (EGD) WITH PROPOFOL N/A 04/02/2018   Procedure: ESOPHAGOGASTRODUODENOSCOPY (EGD) WITH PROPOFOL;  Surgeon: Irving Copas., MD;  Location: [facility];  Service: Gastroenterology;  Laterality: N/A;   FOREIGN BODY REMOVAL  04/02/2018   Procedure: FOREIGN BODY REMOVAL;  Surgeon: Rush Landmark Telford Nab., MD;  Location: [facility];  Service: Gastroenterology;;   HERNIA REPAIR Right 09/23/2012   repair RIH   LAPAROSCOPIC HYSTERECTOMY     PATELLAR TENDON REPAIR Left 06/03/2019   Procedure: LEFT PATELLA TENDON REPAIR, RIGHT KNEE INJECTION.;  Surgeon: Paralee Cancel, MD;  Location: [facility];  Service: Orthopedics;  Laterality: Left;   PATELLAR TENDON REPAIR Left 07/01/2019   Procedure: OPEN PATELLA TENDON REPAIR;  Surgeon: Paralee Cancel, MD;  Location: [facility];  Service: Orthopedics;  Laterality: Left;   TONSILLECTOMY     TOTAL KNEE ARTHROPLASTY Left 02/11/2019   Procedure: TOTAL KNEE ARTHROPLASTY;  Surgeon: Paralee Cancel, MD;  Location: [facility];  Service: Orthopedics;  Laterality: Left;  70 mins    Social History:  reports that she quit smoking about 33 years ago. Her smoking use included cigarettes. She has a 15.00 pack-year smoking history. She has never used smokeless tobacco. She reports that she does not drink alcohol and does not use drugs.  Family History:  Family History  Problem Relation Age of Onset   Heart disease Mother    Heart attack Mother    Heart disease Father    Heart attack Father    Breast cancer Sister    Breast cancer Maternal Aunt    Heart disease Brother        sister     Prior to Admission medications   Medication Sig Start Date End Date Taking? Authorizing Provider  acetaminophen (TYLENOL) 500 MG tablet Take 2 tablets (1,000 mg total) by mouth every 8 (eight) hours. 07/02/19   Danae Orleans, PA-C  aspirin EC 81 MG tablet Take 81 mg by mouth daily. Swallow whole.    [provider]  Calcium-Magnesium-Vitamin D (CALCIUM 1200+D3 PO) Take 1 tablet by  mouth daily.    [provider]  enalapril (VASOTEC) 10 MG tablet Take 1 tablet (10 mg total) by mouth daily. for blood pressure. 05/31/22   [name], NP  hydrochlorothiazide (HYDRODIURIL) 25 MG tablet Take 1 tablet (25 mg total) by mouth daily. for blood pressure. 05/31/22   [name], NP  levothyroxine (SYNTHROID) 88 MCG tablet Take 1 tablet by mouth every morning on an empty stomach with water only.  No food or other medications for 30 minutes. 02/15/22   [name], NP  metoprolol tartrate (LOPRESSOR) 25 MG tablet TAKE 1 TABLET BY MOUTH EVERY EVENING 02/15/22   [name], NP  Multiple Vitamin (MULTIVITAMIN PO) Take 1 tablet by mouth daily.    [provider]  simvastatin (ZOCOR) 20 MG tablet Take 1 tablet (20 mg total) by mouth every evening. for cholesterol. 02/15/22   [name], NP  traMADol (ULTRAM) 50 MG tablet Take  1 tablet (50 mg total) by mouth daily as needed. For moderate pain. 05/10/22   [name], NP    Physical Exam: Vitals:   06/11/22 1114 06/11/22 1558  BP: (!) 117/92 (!) 179/90  Pulse: (!) 110 80  Resp: 20 15  Temp: 98 F (36.7 C) 98 F (36.7 C)  TempSrc: Oral Oral  SpO2: 100% 100%  Weight: 72.6 kg   Height: 5' (1.524 m)    General: Not in acute distress HEENT:       Eyes: PERRL, EOMI, no scleral icterus.       ENT: No discharge from the ears and nose, no pharynx injection, no tonsillar enlargement.        Neck: No JVD, no bruit, no mass felt. Heme: No neck lymph node enlargement. Cardiac: S1/S2, irregular rhythm, No murmurs, No gallops or rubs. Respiratory: No rales, wheezing, rhonchi or rubs. GI: Soft, nondistended, nontender, no rebound pain, no organomegaly, BS present. GU: No hematuria Ext: No pitting leg edema bilaterally. 1+DP/PT pulse bilaterally. Musculoskeletal: No joint deformities, No joint redness or warmth, no limitation of ROM in spin. Skin: No rashes.  Has bruise in right  hand Neuro: Alert, oriented X3, cranial nerves II-XII grossly intact, moves all extremities normally. Muscle strength 5/5 in all extremities, sensation to light touch intact.  Psych: Patient is not psychotic, no suicidal or hemocidal ideation.  Labs on Admission: I have personally reviewed following labs and imaging studies  CBC: Recent Labs  Lab 06/11/22 1117  WBC 5.4  NEUTROABS 3.3  HGB 10.1*  HCT 31.8*  MCV 92.7  PLT 654   Basic Metabolic Panel: Recent Labs  Lab 06/11/22 1117  NA 139  K 4.3  CL 104  CO2 25  GLUCOSE 113*  BUN 50*  CREATININE 1.34*  CALCIUM 9.4   GFR: Estimated Creatinine Clearance: 27.8 mL/min (A) (by C-G formula based on SCr of 1.34 mg/dL (H)). Liver Function Tests: Recent Labs  Lab 06/11/22 1117  AST 21  ALT 12  ALKPHOS 65  BILITOT 0.8  PROT 7.5  ALBUMIN 4.3   No results for input(s): "LIPASE", "AMYLASE" in the last 168 hours. No results for input(s): "AMMONIA" in the last 168 hours. Coagulation Profile: Recent Labs  Lab 06/11/22 1117  INR 1.2   Cardiac Enzymes: No results for input(s): "CKTOTAL", "CKMB", "CKMBINDEX", "TROPONINI" in the last 168 hours. BNP (last 3 results) No results for input(s): "PROBNP" in the last 8760 hours. HbA1C: No results for input(s): "HGBA1C" in the last 72 hours. CBG: No results for input(s): "GLUCAP" in the last 168 hours. Lipid Profile: No results for input(s): "CHOL", "HDL", "LDLCALC", "TRIG", "CHOLHDL", "LDLDIRECT" in the last 72 hours. Thyroid Function Tests: No results for input(s): "TSH", "T4TOTAL", "FREET4", "T3FREE", "THYROIDAB" in the last 72 hours. Anemia Panel: No results for input(s): "VITAMINB12", "FOLATE", "FERRITIN", "TIBC", "IRON", "RETICCTPCT" in the last 72 hours. Urine analysis:    Component Value Date/Time   COLORURINE YELLOW (A) 06/11/2022 1423   APPEARANCEUR CLEAR (A) 06/11/2022 1423   LABSPEC 1.009 06/11/2022 1423   PHURINE 6.0 06/11/2022 1423   GLUCOSEU NEGATIVE  06/11/2022 1423   HGBUR NEGATIVE 06/11/2022 1423   BILIRUBINUR NEGATIVE 06/11/2022 1423   BILIRUBINUR negative 04/19/2020 0856   KETONESUR NEGATIVE 06/11/2022 1423   PROTEINUR NEGATIVE 06/11/2022 1423   UROBILINOGEN 0.2 04/19/2020 0856   NITRITE NEGATIVE 06/11/2022 1423   LEUKOCYTESUR NEGATIVE 06/11/2022 1423   Sepsis Labs: '@LABRCNTIP'$ (procalcitonin:4,lacticidven:4) ) Recent Results (from the past 240 hour(s))  Resp Panel  by RT-PCR (Flu A&B, Covid) Anterior Nasal Swab     Status: None   Collection Time: 06/11/22  4:27 PM   Specimen: Anterior Nasal Swab  Result Value Ref Range Status   SARS Coronavirus 2 by RT PCR NEGATIVE NEGATIVE Final    Comment: (NOTE) SARS-CoV-2 target nucleic acids are NOT DETECTED.  The SARS-CoV-2 RNA is generally detectable in upper respiratory specimens during the acute phase of infection. The lowest concentration of SARS-CoV-2 viral copies this assay can detect is 138 copies/mL. A negative result does not preclude SARS-Cov-2 infection and should not be used as the sole basis for treatment or other patient management decisions. A negative result may occur with  improper specimen collection/handling, submission of specimen other than nasopharyngeal swab, presence of viral mutation(s) within the areas targeted by this assay, and inadequate number of viral copies(<138 copies/mL). A negative result must be combined with clinical observations, patient history, and epidemiological information. The expected result is Negative.  Fact Sheet for Patients:  EntrepreneurPulse.com.au  Fact Sheet for Healthcare Providers:  IncredibleEmployment.be  This test is no t yet approved or cleared by the [place] FDA and  has been authorized for detection and/or diagnosis of SARS-CoV-2 by FDA under an Emergency Use Authorization (EUA). This EUA will remain  in effect (meaning this test can be used) for the duration of the COVID-19  declaration under Section 564(b)(1) of the Act, 21 U.S.C.section 360bbb-3(b)(1), unless the authorization is terminated  or revoked sooner.       Influenza A by PCR NEGATIVE NEGATIVE Final   Influenza B by PCR NEGATIVE NEGATIVE Final    Comment: (NOTE) The Xpert Xpress SARS-CoV-2/FLU/RSV plus assay is intended as an aid in the diagnosis of influenza from Nasopharyngeal swab specimens and should not be used as a sole basis for treatment. Nasal washings and aspirates are unacceptable for Xpert Xpress SARS-CoV-2/FLU/RSV testing.  Fact Sheet for Patients: EntrepreneurPulse.com.au  Fact Sheet for Healthcare Providers: IncredibleEmployment.be  This test is not yet approved or cleared by the [place] FDA and has been authorized for detection and/or diagnosis of SARS-CoV-2 by FDA under an Emergency Use Authorization (EUA). This EUA will remain in effect (meaning this test can be used) for the duration of the COVID-19 declaration under Section 564(b)(1) of the Act, 21 U.S.C. section 360bbb-3(b)(1), unless the authorization is terminated or revoked.  Performed at [facility], [address] [place], [place] [address]      Radiological Exams on Admission: CT HEAD WO CONTRAST  Result Date: 06/11/2022 CLINICAL DATA:  I assume it deposited on mental status change with unknown cause EXAM: CT HEAD WITHOUT CONTRAST TECHNIQUE: Contiguous axial images were obtained from the base of the skull through the vertex without intravenous contrast. RADIATION DOSE REDUCTION: This exam was performed according to the departmental dose-optimization program which includes automated exposure control, adjustment of the mA and/or kV according to patient size and/or use of iterative reconstruction technique. COMPARISON:  Head CT 11/09/2014 FINDINGS: Brain: No evidence of acute infarction, hemorrhage, hydrocephalus, extra-axial collection or mass lesion/mass effect.  Vascular: No hyperdense vessel or unexpected calcification. Skull: Normal. Negative for fracture or focal lesion. Sinuses/Orbits: No acute finding. IMPRESSION: No acute finding.  Unremarkable for age. Electronically Signed   By: Jorje Guild M.D.   On: 06/11/2022 12:58      Assessment/Plan Principal Problem:   TIA (transient ischemic attack) Active Problems:   Hypertension   Dyslipidemia   CAD (coronary artery disease)   Hypothyroidism  Gastroesophageal reflux disease   Chronic kidney disease, stage 3a (HCC)   Palpitation   Obesity (BMI 30-39.9)   Assessment and Plan:  TIA (transient ischemic attack): Patient's symptoms are consistent with possible TIA.  CT head negative.  Patient states that she is easy to bruise when she was taking baby aspirin.  She has bruise in right hand.  She received total of 324 mg of aspirin today.  May need to discuss with neurologist tomorrow to decide which anti-platelet medication is better.  -Placed on tele med bed for observation - Obtain MRI/MRA  - will hold oral Bp meds to allow permissive HTN  - Check carotid dopplers  - Zocor - fasting lipid panel and HbA1c  - 2D transthoracic echocardiography  - swallowing screen. If fails, will get SLP - PT/OT consult  Addendum: EDP sent me a message telling " Call from radiology for MRA slow flow through R vert".  -will order CTA of neck and head.  Hypertension -Hold enalapril and HCTZ -Continue metoprolol which is for palpitation -IV hydralazine as needed for SBP>220 or dBP>110  Dyslipidemia -Zocor  CAD (coronary artery disease) -Zocor  Hypothyroidism -Synthroid  Gastroesophageal reflux disease -Protonix  Chronic kidney disease, stage 3a ([place]): Renal function close to baseline.  Baseline creatinine 1.2-1.4 recently.  Her creatinine is 1.34, BUN 50. -Follow-up with BMP  Palpitation: Patient has frequent PACs on EKG -Patient is on metoprolol which was prescribed by  cardiologist  Obesity (BMI 30-39.9): BMI 31.25, body weight 72.6 kg -Healthy diet and exercise -Encourage losing weight    DVT ppx: SQ Lovenox  Code Status: DNR per pt and her son  Family Communication:   Yes, patient's  son  at bed side.    Disposition Plan:  Anticipate discharge back to previous environment  Consults called:  none  Admission status and Level of care: Telemetry Medical:   for obs    Dispo: The patient is from: Home              Anticipated d/c is to: Home              Anticipated d/c date is: 1 day              Patient currently is not medically stable to d/c.    Severity of Illness:  The appropriate patient status for this patient is OBSERVATION. Observation status is judged to be reasonable and necessary in order to provide the required intensity of service to ensure the patient's safety. The patient's presenting symptoms, physical exam findings, and initial radiographic and laboratory data in the context of their medical condition is felt to place them at decreased risk for further clinical deterioration. Furthermore, it is anticipated that the patient will be medically stable for discharge from the hospital within 2 midnights of admission.        Date of Service 06/11/2022    Ivor Costa [facility]   If 7PM-7AM, please contact night-coverage [email or web address] 06/11/2022, 5:38 PM

## 2022-06-11 NOTE — ED Provider Notes (Signed)
[facility] Provider Note    Event Date/Time   First MD Initiated Contact with Patient 06/11/22 1528     (approximate)   History   Altered Mental Status and Aphasia   HPI  Kathleen Paul is a 86 y.o. female with past medical history significant for CAD, palpitations, hypertension, who presents to the emergency department following an episode of trouble with her speech.  Patient lives with her daughter.  Last night she woke her up to try to get some cold medication and states that she was stating strange things but she thought that it was because she woke her up.  This morning when she got up she was not making any sense and was slurring her words and saying incorrect words.  Felt that she was confused during that time.  Denies any extremity numbness or weakness.  No seizure-like activity.  No chest pain or shortness of breath.  Was complaining of a mild headache.  Since arriving to the emergency department her symptoms have significantly improved and she is now back to her normal.  Denies recent falls or trauma.  Denies chest pain, abdominal pain or burning with urination.  Endorses urinary urgency and frequency that is normal for her.  Not on anticoagulation.  No prior history of TIAs or CVA.     Physical Exam   Triage Vital Signs: ED Triage Vitals [06/11/22 1114]  Enc Vitals Group     BP (!) 117/92     Pulse Rate (!) 110     Resp 20     Temp 98 F (36.7 C)     Temp Source Oral     SpO2 100 %     Weight 160 lb (72.6 kg)     Height 5' (1.524 m)     Head Circumference      Peak Flow      Pain Score 0     Pain Loc      Pain Edu?      Excl. in [place]?     Most recent vital signs: Vitals:   06/11/22 1114 06/11/22 1558  BP: (!) 117/92 (!) 179/90  Pulse: (!) 110 80  Resp: 20 15  Temp: 98 F (36.7 C) 98 F (36.7 C)  SpO2: 100% 100%    Physical Exam Constitutional:      Appearance: She is well-developed.  HENT:     Head: Atraumatic.  Eyes:      Conjunctiva/sclera: Conjunctivae normal.  Cardiovascular:     Rate and Rhythm: Rhythm irregular.     Comments: Frequent PVCs Pulmonary:     Effort: No respiratory distress.  Abdominal:     General: There is no distension.  Musculoskeletal:        General: Normal range of motion.     Cervical back: Normal range of motion.  Skin:    General: Skin is warm.  Neurological:     Mental Status: She is alert and oriented to person, place, and time. Mental status is at baseline.     Comments: Cranial nerves grossly intact.  5/5 strength bilateral upper and lower extremities.  Sensation intact in upper and lower extremities.  Normal gait.          IMPRESSION / MDM / ASSESSMENT AND PLAN / ED COURSE  I reviewed the triage vital signs and the nursing notes.  Differential diagnosis including TIA/CVA, intracranial hemorrhage, electrolyte abnormality, urinary tract infection, dysrhythmia  On chart review patient has a history  of heart palpitations and takes metoprolol prior cardiac catheterization 2000 1150% mid LAD  On arrival to the emergency department afebrile and hemodynamically stable.  Initially was tachycardic on triage vital signs however repeat vital signs in the room patient is no longer tachycardic  EKG  I, Nathaniel Man, the attending physician, personally viewed and interpreted this ECG.   Rate: Normal  Rhythm: Normal sinus  Axis: Normal  Intervals: Right bundle branch block l  ST&T Change: None Frequent PVCs Frequent PVCs and bigeminy while on cardiac telemetry.  RADIOLOGY I independently reviewed imaging, my interpretation of imaging: CT scan of the head shows no signs of intracranial hemorrhage or infarction   Clinical Course as of 06/11/22 1636  Sun Jun 11, 2022  1636 UA without signs of UTI.  [SM]    Clinical Course User Index [SM] Nathaniel Man, MD    ED Results / Procedures / Treatments   Labs (all labs ordered are listed, but only abnormal results are  displayed) Labs interpreted as -   Lab work with no significant anemia.  Creatinine appears to be at baseline.  No significant electrolyte abnormalities Labs Reviewed  CBC - Abnormal; Notable for the following components:      Result Value   RBC 3.43 (*)    Hemoglobin 10.1 (*)    HCT 31.8 (*)    RDW 15.9 (*)    All other components within normal limits  DIFFERENTIAL - Abnormal; Notable for the following components:   Abs Immature Granulocytes 0.43 (*)    All other components within normal limits  COMPREHENSIVE METABOLIC PANEL - Abnormal; Notable for the following components:   Glucose, Bld 113 (*)    BUN 50 (*)    Creatinine, Ser 1.34 (*)    GFR, Estimated 39 (*)    All other components within normal limits  RESP PANEL BY RT-PCR (FLU A&B, COVID) ARPGX2  PROTIME-INR  APTT  ETHANOL  URINALYSIS, ROUTINE W REFLEX MICROSCOPIC  CBG MONITORING, ED    Added on urine and COVID and influenza testing however no cough or other symptoms concerning for pneumonia.  Patient is back to her baseline and has a normal neurologic exam.  Clinical picture is most concerning for TIA/CVA.  ABCD score of 5.  Consulted hospitalist for admission for TIA/CVA  Treatment -aspirin 324   PROCEDURES:  Critical Care performed: No  Procedures  Patient's presentation is most consistent with acute presentation with potential threat to life or bodily function.   MEDICATIONS ORDERED IN ED: Medications  aspirin chewable tablet 324 mg (has no administration in time range)    FINAL CLINICAL IMPRESSION(S) / ED DIAGNOSES   Final diagnoses:  Stroke-like symptoms  Expressive aphasia     Rx / DC Orders   ED Discharge Orders     None        Note:  This document was prepared using Dragon voice recognition software and may include unintentional dictation errors.   Nathaniel Man, MD 06/11/22 [id number]

## 2022-06-11 NOTE — ED Triage Notes (Signed)
Pt via POV from home. Per daughter, pt having some expressive aphasia that started last night around 0900pm. Denies any numbness and tingling. States she also felt confused. States that the aphasia has gotten better this morning. States she hasn't felt well all week. Denies any falls or head injuries recently. Pt is alert but disoriented x4 but per family she is normally A&OX4 and NAD.

## 2022-06-11 NOTE — Progress Notes (Signed)
PHARMACIST - PHYSICIAN COMMUNICATION  CONCERNING:  Enoxaparin (Lovenox) for DVT Prophylaxis    RECOMMENDATION: Patient was prescribed enoxaprin '40mg'$  q24 hours for VTE prophylaxis.   Filed Weights   06/11/22 1114  Weight: 72.6 kg (160 lb)    Body mass index is 31.25 kg/m.  Estimated Creatinine Clearance: 27.8 mL/min (A) (by C-G formula based on SCr of 1.34 mg/dL (H)).   Patient is candidate for enoxaparin '30mg'$  every 24 hours based on CrCl <62m/min or Weight <45kg  DESCRIPTION: [facility]  Patient is now receiving enoxaparin 30 mg every 24 hours    JAlison Murray PharmD Clinical Pharmacist  06/11/2022 5:26 PM

## 2022-06-11 NOTE — ED Notes (Signed)
Pt requesting something for pain.  

## 2022-06-11 NOTE — Consult Note (Incomplete)
NEURO HOSPITALIST CONSULT NOTE   Requesting physician: Dr. Blaine Hamper  Reason for Consult: Transient aphasia  History obtained from:  Patient   Chart  Patient and Chart   ***  HPI:                                                                                                                                          Kathleen Paul is an 86 y.o. female with a PMHx of arthritis, atherosclrosis, HTN, remote lower extremity DVT (not on any anticoagulation), iron deficiency anemia, mild mitral regurgitation, CAD, CKD-3A, hypercholesterolemia, palpitation on metoprolol and hypothyroidism who presented to the ED with difficulty speaking. Symptoms were first noted last night when daughter woke the patient up to try to get some cold medication and noticed that she was saying strange things; at that time her daughter thought that it was because she had just woken her up. This morning when the patient got up she was not making any sense, slurring her words and saying incorrect words. Daughter felt that she was confused during that time. She did have a mild headache. She had no extremity numbness or weakness and no seizure-like activity. After arriving to the emergency department her symptoms significantly improved and she was back to her normal self at the time of EDP assessment. She has no prior history of TIA or CVA.   She has been admitted to the Hospitalist service. She has received 324 mg of aspirin. Home BP meds were held for permissive HTN. 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   Past Medical History:  Diagnosis Date   Anxiety state, unspecified    Arthritis    Atherosclerosis    Cancer ([place])    skin cancer   Diverticulosis    Esophageal stricture    Essential hypertension, benign    Gastric ulcer    Hiatal hernia    History of DVT (deep vein thrombosis) yrs ago   legs   IDA (iron deficiency anemia)     Inguinal hernia without mention of obstruction or gangrene, unilateral or unspecified, (not specified as recurrent)    2014   Internal hemorrhoids    Mild mitral regurgitation    a. 04/2010 Echo: nl EF, mild diast dysfxn, trace PR, mild TR, mild-mod MR.   Non-obstructive CAD    a. 04/2010 Myoview: EF 77%, fixed apical/inferior defect;  b. 04/2010 Cath: LAD 37m LCX/RCA minor irregs, EF 60%;  c. 04/2011 Myoview: low risk.   Pure hypercholesterolemia    Rupture of left patellar tendon    05-26-2019   Scoliosis    UI (urinary incontinence)    Umbilical hernia without mention of obstruction or gangrene    2014  Unspecified hypothyroidism    Vitamin D deficiency     Past Surgical History:  Procedure Laterality Date   ABDOMINAL HYSTERECTOMY  2008   complete    BIOPSY  04/02/2018   Procedure: BIOPSY;  Surgeon: Rush Landmark Telford Nab., MD;  Location: [facility];  Service: Gastroenterology;;   CARDIAC CATHETERIZATION  2011   [facility]   COLONOSCOPY  2003   [place]   ESOPHAGOGASTRODUODENOSCOPY (EGD) WITH PROPOFOL N/A 04/02/2018   Procedure: ESOPHAGOGASTRODUODENOSCOPY (EGD) WITH PROPOFOL;  Surgeon: Irving Copas., MD;  Location: [facility];  Service: Gastroenterology;  Laterality: N/A;   FOREIGN BODY REMOVAL  04/02/2018   Procedure: FOREIGN BODY REMOVAL;  Surgeon: Rush Landmark Telford Nab., MD;  Location: [facility];  Service: Gastroenterology;;   HERNIA REPAIR Right 09/23/2012   repair RIH   LAPAROSCOPIC HYSTERECTOMY     PATELLAR TENDON REPAIR Left 06/03/2019   Procedure: LEFT PATELLA TENDON REPAIR, RIGHT KNEE INJECTION.;  Surgeon: Paralee Cancel, MD;  Location: [facility];  Service: Orthopedics;  Laterality: Left;   PATELLAR TENDON REPAIR Left 07/01/2019   Procedure: OPEN PATELLA TENDON REPAIR;  Surgeon: Paralee Cancel, MD;  Location: [facility];  Service: Orthopedics;  Laterality: Left;   TONSILLECTOMY     TOTAL KNEE ARTHROPLASTY Left 02/11/2019   Procedure: TOTAL KNEE  ARTHROPLASTY;  Surgeon: Paralee Cancel, MD;  Location: [facility];  Service: Orthopedics;  Laterality: Left;  70 mins    Family History  Problem Relation Age of Onset   Heart disease Mother    Heart attack Mother    Heart disease Father    Heart attack Father    Breast cancer Sister    Breast cancer Maternal Aunt    Heart disease Brother        sister              Social History:  reports that she quit smoking about 33 years ago. Her smoking use included cigarettes. She has a 15.00 pack-year smoking history. She has never used smokeless tobacco. She reports that she does not drink alcohol and does not use drugs.  Allergies  Allergen Reactions   Codeine Nausea Only    MEDICATIONS:                                                                                                                     No current facility-administered medications on file prior to encounter.   Current Outpatient Medications on File Prior to Encounter  Medication Sig Dispense Refill   acetaminophen (TYLENOL) 500 MG tablet Take 2 tablets (1,000 mg total) by mouth every 8 (eight) hours. 30 tablet 0   aspirin EC 81 MG tablet Take 81 mg by mouth daily. Swallow whole.     Calcium-Magnesium-Vitamin D (CALCIUM 1200+D3 PO) Take 1 tablet by mouth daily.     enalapril (VASOTEC) 10 MG tablet Take 1 tablet (10 mg total) by mouth daily. for blood pressure. 90 tablet 1   levothyroxine (SYNTHROID) 88 MCG tablet Take 1 tablet by mouth every  morning on an empty stomach with water only.  No food or other medications for 30 minutes. 90 tablet 2   metoprolol tartrate (LOPRESSOR) 25 MG tablet TAKE 1 TABLET BY MOUTH EVERY EVENING 90 tablet 2   Multiple Vitamin (MULTIVITAMIN PO) Take 1 tablet by mouth daily.     simvastatin (ZOCOR) 20 MG tablet Take 1 tablet (20 mg total) by mouth every evening. for cholesterol. 90 tablet 2   traMADol (ULTRAM) 50 MG tablet Take 1 tablet (50 mg total) by mouth daily as needed. For moderate pain. 90  tablet 0   hydrochlorothiazide (HYDRODIURIL) 25 MG tablet Take 1 tablet (25 mg total) by mouth daily. for blood pressure. (Patient not taking: Reported on 06/11/2022) 90 tablet 1    Scheduled:  [START ON 06/12/2022]  stroke: early stages of recovery book   Does not apply Once   enoxaparin (LOVENOX) injection  30 mg Subcutaneous Q24H   [START ON 06/12/2022] levothyroxine  88 mcg Oral Q0600   metoprolol tartrate  25 mg Oral QHS   [START ON 06/12/2022] pantoprazole  40 mg Oral Q1200   simvastatin  20 mg Oral QPM     ROS:                                                                                                                                       No chest pain or shortness of breath. Denied recent falls or trauma. No abdominal pain or burning with urination. Positive for urinary urgency and frequency which is her baseline. Other ROS as per HPI.    Blood pressure (!) 171/55, pulse (!) 44, temperature 98.4 F (36.9 C), temperature source Oral, resp. rate 16, height 5' (1.524 m), weight 72.6 kg, SpO2 97 %.  //***  General Examination:                                                                                                       Physical Exam  HEENT-  Normocephalic, no lesions, without obvious abnormality.  Normal external eye and conjunctiva.   Cardiovascular- S1-S2 audible, pulses palpable throughout   Lungs-no rhonchi or wheezing noted, no excessive working breathing.  Saturations within normal limits Abdomen- All 4 quadrants palpated and nontender Extremities- Warm, dry and intact Musculoskeletal-no joint tenderness, deformity or swelling Skin-warm and dry, no hyperpigmentation, vitiligo, or suspicious lesions  Neurological Examination Mental Status: Alert, oriented, thought content appropriate.  Speech fluent without evidence of aphasia.  Able to follow 3 step commands  without difficulty. Cranial Nerves: II: Discs flat bilaterally; Visual fields grossly normal,  III,IV,  VI: ptosis not present, extra-ocular motions intact bilaterally pupils equal, round, reactive to light and accommodation V,VII: smile symmetric, facial light touch sensation normal bilaterally VIII: hearing normal bilaterally IX,X: uvula rises symmetrically XI: bilateral shoulder shrug XII: midline tongue extension Motor: Right : Upper extremity   5/5    Left:     Upper extremity   5/5  Lower extremity   5/5     Lower extremity   5/5 Tone and bulk:normal tone throughout; no atrophy noted Sensory: Pinprick and light touch intact throughout, bilaterally Deep Tendon Reflexes: 2+ and symmetric throughout Plantars: Right: downgoing   Left: downgoing Cerebellar: normal finger-to-nose, normal rapid alternating movements and normal heel-to-shin test Gait: normal gait and station   Lab Results: Basic Metabolic Panel: Recent Labs  Lab 06/11/22 1117  NA 139  K 4.3  CL 104  CO2 25  GLUCOSE 113*  BUN 50*  CREATININE 1.34*  CALCIUM 9.4    CBC: Recent Labs  Lab 06/11/22 1117  WBC 5.4  NEUTROABS 3.3  HGB 10.1*  HCT 31.8*  MCV 92.7  PLT 230    Cardiac Enzymes: No results for input(s): "CKTOTAL", "CKMB", "CKMBINDEX", "TROPONINI" in the last 168 hours.  Lipid Panel: No results for input(s): "CHOL", "TRIG", "HDL", "CHOLHDL", "VLDL", "LDLCALC" in the last 168 hours.  Imaging: CT ANGIO HEAD NECK W WO CM  Result Date: 06/11/2022 CLINICAL DATA:  Transient ischemic attack EXAM: CT ANGIOGRAPHY HEAD AND NECK TECHNIQUE: Multidetector CT imaging of the head and neck was performed using the standard protocol during bolus administration of intravenous contrast. Multiplanar CT image reconstructions and MIPs were obtained to evaluate the vascular anatomy. Carotid stenosis measurements (when applicable) are obtained utilizing NASCET criteria, using the distal internal carotid diameter as the denominator. RADIATION DOSE REDUCTION: This exam was performed according to the departmental  dose-optimization program which includes automated exposure control, adjustment of the mA and/or kV according to patient size and/or use of iterative reconstruction technique. CONTRAST:  71m OMNIPAQUE IOHEXOL 350 MG/ML SOLN COMPARISON:  None Available. FINDINGS: CTA NECK FINDINGS SKELETON: There is no bony spinal canal stenosis. No lytic or blastic lesion. OTHER NECK: Normal pharynx, larynx and major salivary glands. No cervical lymphadenopathy. Unremarkable thyroid gland. UPPER CHEST: Hiatal hernia AORTIC ARCH: There is calcific atherosclerosis of the aortic arch. There is no aneurysm, dissection or hemodynamically significant stenosis of the visualized portion of the aorta. Conventional 3 vessel aortic branching pattern. The visualized proximal subclavian arteries are widely patent. RIGHT CAROTID SYSTEM: No dissection, occlusion or aneurysm. Mild atherosclerotic calcification at the carotid bifurcation without hemodynamically significant stenosis. LEFT CAROTID SYSTEM: No dissection, occlusion or aneurysm. Mild atherosclerotic calcification at the carotid bifurcation without hemodynamically significant stenosis. VERTEBRAL ARTERIES: Codominant configuration. The right vertebral artery is severely narrowed at the C1-2 level but remains patent to the skull base. The left vertebral artery is normal. CTA HEAD FINDINGS POSTERIOR CIRCULATION: --Vertebral arteries: Normal V4 segments. --Inferior cerebellar arteries: Normal. --Basilar artery: Normal. --Superior cerebellar arteries: Normal. --Posterior cerebral arteries (PCA): Normal. ANTERIOR CIRCULATION: --Intracranial internal carotid arteries: Normal. --Anterior cerebral arteries (ACA): Normal. Both A1 segments are present. Patent anterior communicating artery (a-comm). --Middle cerebral arteries (MCA): Normal. VENOUS SINUSES: As permitted by contrast timing, patent. ANATOMIC VARIANTS: Fetal origin of the left posterior cerebral artery. Review of the MIP images confirms  the above findings. IMPRESSION: 1. No emergent large vessel occlusion. 2. 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 3. Hiatal hernia. 4. Mild bilateral carotid bifurcation atherosclerosis without hemodynamically significant stenosis. 5. Aortic atherosclerosis (ICD10-I70.0). Electronically Signed   By: Ulyses Jarred M.D.   On: 06/11/2022 21:23   MR ANGIO HEAD WO CONTRAST  Result Date: 06/11/2022 CLINICAL DATA:  Expressive aphasia beginning last night with some improvement. EXAM: MRA HEAD WITHOUT CONTRAST TECHNIQUE: Angiographic images of the Circle of Willis were acquired using MRA technique without intravenous contrast. COMPARISON:  None Available. FINDINGS: Anterior circulation: Internal carotid arteries are within normal limits from the high cervical segments through the ICA termini. The A1 and M1 segments are normal. The anterior communicating scratched at no definite anterior communicating artery is present. ACA and MCA branch vessels are within normal limits. Posterior circulation: The left vertebral artery is dominant vessel. Abnormal signal is present in the right vertebral artery suggesting slow flow. More normal signal is present in the very distal segment. Vertebrobasilar junction basilar artery are normal. Right posterior cerebral artery originates from basilar tip. Left posterior cerebral artery is of fetal type. PCA branch vessels are within normal limits bilaterally. Anatomic variants: Fetal type left posterior cerebral artery. Other: None. IMPRESSION: 1. Abnormal signal in the right vertebral artery suggesting slow flow. 2. Otherwise normal MRA circle-of-Willis without other significant proximal stenosis, aneurysm, or branch vessel occlusion. These results were called by telephone at the time of interpretation on 06/11/2022 at 7:10 pm to provider Dr. Jori Moll, Who verbally acknowledged these results. Electronically Signed   By: San Morelle M.D.   On: 06/11/2022 19:10    MR BRAIN WO CONTRAST  Result Date: 06/11/2022 CLINICAL DATA:  TIA. Expressive aphasia beginning last evening. Confusion. EXAM: MRI HEAD WITHOUT CONTRAST TECHNIQUE: Multiplanar, multiecho pulse sequences of the brain and surrounding structures were obtained without intravenous contrast. COMPARISON:  CT head without contrast 06/11/2022 FINDINGS: Brain: No acute infarct, hemorrhage, or mass lesion is present. Mild atrophy is within normal limits for age. No significant white matter lesions are present. The ventricles are of normal size. Deep brain nuclei are within normal limits. No significant extraaxial fluid collection is present. The internal auditory canals are within normal limits. The brainstem and cerebellum are within normal limits. Vascular: Flow is present in the major intracranial arteries. Skull and upper cervical spine: The craniocervical junction is normal. Upper cervical spine is within normal limits. Marrow signal is unremarkable. Sinuses/Orbits: The paranasal sinuses and mastoid air cells are clear. The globes and orbits are within normal limits. IMPRESSION: Normal MRI appearance of the brain for age. No acute or focal lesion to explain expressive aphasia. Electronically Signed   By: San Morelle M.D.   On: 06/11/2022 19:01   US Carotid Bilateral (at [facility] and [facility] only)  Result Date: 06/11/2022 CLINICAL DATA:  Transient ischemic attack EXAM: BILATERAL CAROTID DUPLEX ULTRASOUND TECHNIQUE: Pearline Cables scale imaging, color Doppler and duplex ultrasound were performed of bilateral carotid and vertebral arteries in the neck. COMPARISON:  None Available. FINDINGS: Criteria: Quantification of carotid stenosis is based on velocity parameters that correlate the residual internal carotid diameter with NASCET-based stenosis levels, using the diameter of the distal internal carotid lumen as the denominator for stenosis measurement. The following velocity measurements were obtained: RIGHT ICA: 84 cm/sec  CCA: 923 cm/sec SYSTOLIC ICA/CCA RATIO:  0.8 ECA: 129 cm/sec LEFT ICA: 98 cm/sec CCA: 28 cm/sec SYSTOLIC ICA/CCA RATIO:  0.9 ECA: 104 cm/sec RIGHT CAROTID ARTERY: Scattered calcified and noncalcified plaque. RIGHT VERTEBRAL ARTERY: Antegrade flow in the right vertebral artery.  LEFT CAROTID ARTERY:  Scattered calcified and noncalcified plaque. LEFT VERTEBRAL ARTERY:  Antegrade flow in the left vertebral artery. IMPRESSION: No significant stenosis in the right or left internal carotid arteries. Scattered calcified noncalcified plaque is identified bilaterally. Antegrade flow in the bilateral vertebral arteries. Electronically Signed   By: Dorise Bullion III M.D.   On: 06/11/2022 18:46   CT HEAD WO CONTRAST  Result Date: 06/11/2022 CLINICAL DATA:  I assume it deposited on mental status change with unknown cause EXAM: CT HEAD WITHOUT CONTRAST TECHNIQUE: Contiguous axial images were obtained from the base of the skull through the vertex without intravenous contrast. RADIATION DOSE REDUCTION: This exam was performed according to the departmental dose-optimization program which includes automated exposure control, adjustment of the mA and/or kV according to patient size and/or use of iterative reconstruction technique. COMPARISON:  Head CT 11/09/2014 FINDINGS: Brain: No evidence of acute infarction, hemorrhage, hydrocephalus, extra-axial collection or mass lesion/mass effect. Vascular: No hyperdense vessel or unexpected calcification. Skull: Normal. Negative for fracture or focal lesion. Sinuses/Orbits: No acute finding. IMPRESSION: No acute finding.  Unremarkable for age. Electronically Signed   By: Jorje Guild M.D.   On: 06/11/2022 12:58     Assessment: -   Recommendations: - Add Plavix to ASA as she has failed ASA monotherapy. Continue DAPT indefinitely.  - ***   Electronically signed: Dr. Kerney Elbe 06/11/2022, 10:32 PM

## 2022-06-11 NOTE — Progress Notes (Incomplete)
       CROSS COVER NOTE  NAME: Kathleen Paul MRN: [id number] DOB : 12/23/1935 ATTENDING PHYSICIAN: Ivor Costa, MD    Date of Service   06/11/2022   HPI/Events of Note   Follow up on imaging for patient signed out by admitting physician, CT Angio Head Neck results below:  CT ANGIO HEAD NECK W WO CM  Result Date: 06/11/2022 CLINICAL DATA:  Transient ischemic attack EXAM: CT ANGIOGRAPHY HEAD AND NECK TECHNIQUE: Multidetector CT imaging of the head and neck was performed using the standard protocol during bolus administration of intravenous contrast. Multiplanar CT image reconstructions and MIPs were obtained to evaluate the vascular anatomy. Carotid stenosis measurements (when applicable) are obtained utilizing NASCET criteria, using the distal internal carotid diameter as the denominator. RADIATION DOSE REDUCTION: This exam was performed according to the departmental dose-optimization program which includes automated exposure control, adjustment of the mA and/or kV according to patient size and/or use of iterative reconstruction technique. CONTRAST:  55m OMNIPAQUE IOHEXOL 350 MG/ML SOLN COMPARISON:  None Available. FINDINGS: CTA NECK FINDINGS SKELETON: There is no bony spinal canal stenosis. No lytic or blastic lesion. OTHER NECK: Normal pharynx, larynx and major salivary glands. No cervical lymphadenopathy. Unremarkable thyroid gland. UPPER CHEST: Hiatal hernia AORTIC ARCH: There is calcific atherosclerosis of the aortic arch. There is no aneurysm, dissection or hemodynamically significant stenosis of the visualized portion of the aorta. Conventional 3 vessel aortic branching pattern. The visualized proximal subclavian arteries are widely patent. RIGHT CAROTID SYSTEM: No dissection, occlusion or aneurysm. Mild atherosclerotic calcification at the carotid bifurcation without hemodynamically significant stenosis. LEFT CAROTID SYSTEM: No dissection, occlusion or aneurysm. Mild atherosclerotic  calcification at the carotid bifurcation without hemodynamically significant stenosis. VERTEBRAL ARTERIES: Codominant configuration. The right vertebral artery is severely narrowed at the C1-2 level but remains patent to the skull base. The left vertebral artery is normal. CTA HEAD FINDINGS POSTERIOR CIRCULATION: --Vertebral arteries: Normal V4 segments. --Inferior cerebellar arteries: Normal. --Basilar artery: Normal. --Superior cerebellar arteries: Normal. --Posterior cerebral arteries (PCA): Normal. ANTERIOR CIRCULATION: --Intracranial internal carotid arteries: Normal. --Anterior cerebral arteries (ACA): Normal. Both A1 segments are present. Patent anterior communicating artery (a-comm). --Middle cerebral arteries (MCA): Normal. VENOUS SINUSES: As permitted by contrast timing, patent. ANATOMIC VARIANTS: Fetal origin of the left posterior cerebral artery. Review of the MIP images confirms the above findings. IMPRESSION: 1. No emergent large vessel occlusion. 2. 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 3. Hiatal hernia. 4. Mild bilateral carotid bifurcation atherosclerosis without hemodynamically significant stenosis. 5. Aortic atherosclerosis (ICD10-I70.0). Electronically Signed   By: KUlyses JarredM.D.   On: 06/11/2022 21:23    Interventions   Assessment/Plan:  [facility] no additional recommendations tonight, Dr KLeonel Ramsaywill see patient in consult in AM at [facility]       This document was prepared using Dragon voice recognition software and may include unintentional dictation errors.  KNeomia GlassDNP, MBA, FNP-BC Nurse Practitioner [facility] Medical CenterPager ([phone]

## 2022-06-11 NOTE — ED Notes (Signed)
RN to bedside to introduce self to pt and place pt on monitor and update vitals from triage.

## 2022-06-12 ENCOUNTER — Observation Stay (HOSPITAL_BASED_OUTPATIENT_CLINIC_OR_DEPARTMENT_OTHER)
Admission: EM | Admit: 2022-06-12 | Discharge: 2022-06-12 | Disposition: A | Discharge status: Home / Self Care | Payer: PPO | Source: Home / Self Care | Attending: Internal Medicine | Admitting: Internal Medicine

## 2022-06-12 ENCOUNTER — Inpatient Hospital Stay (HOSPITAL_BASED_OUTPATIENT_CLINIC_OR_DEPARTMENT_OTHER)
Admit: 2022-06-12 | Discharge: 2022-06-12 | Disposition: A | Discharge status: Home / Self Care | Payer: PPO | Attending: Cardiology | Admitting: Cardiology

## 2022-06-12 DIAGNOSIS — R002 Palpitations: Secondary | ICD-10-CM | POA: Diagnosis not present

## 2022-06-12 DIAGNOSIS — E785 Hyperlipidemia, unspecified: Secondary | ICD-10-CM | POA: Diagnosis not present

## 2022-06-12 DIAGNOSIS — G459 Transient cerebral ischemic attack, unspecified: Secondary | ICD-10-CM

## 2022-06-12 DIAGNOSIS — R4701 Aphasia: Secondary | ICD-10-CM

## 2022-06-12 DIAGNOSIS — R2990 Unspecified symptoms and signs involving the nervous system: Secondary | ICD-10-CM | POA: Diagnosis not present

## 2022-06-12 DIAGNOSIS — Z87891 Personal history of nicotine dependence: Secondary | ICD-10-CM

## 2022-06-12 DIAGNOSIS — I10 Essential (primary) hypertension: Secondary | ICD-10-CM | POA: Diagnosis not present

## 2022-06-12 HISTORY — DX: Aphasia: R47.01

## 2022-06-12 LAB — LIPID PANEL
Cholesterol: 137 mg/dL (ref 0–200)
HDL: 61 mg/dL (ref >40)
LDL Cholesterol: 69 mg/dL (ref 0–99)
Total CHOL/HDL Ratio: 2.2 RATIO
Triglycerides: 34 mg/dL (ref <150)
VLDL: 7 mg/dL (ref 0–40)

## 2022-06-12 LAB — ECHOCARDIOGRAM COMPLETE
AR max vel: 1.53 cm2
AV Area VTI: 1.43 cm2
AV Area mean vel: 1.53 cm2
AV Mean grad: 4 mmHg
AV Peak grad: 7.2 mmHg
Ao pk vel: 1.34 m/s
Area-P 1/2: 4.15 cm2
Height: 60 in
P 1/2 time: 781 msec
S' Lateral: 2.9 cm
Weight: 2560 oz

## 2022-06-12 MED ORDER — CLOPIDOGREL BISULFATE 75 MG PO TABS: 75.0000 mg | ORAL_TABLET | Freq: Every day | ORAL | 0 refills | Status: AC

## 2022-06-12 MED ORDER — ASPIRIN EC 81 MG PO TBEC: 81.0000 mg | DELAYED_RELEASE_TABLET | Freq: Every day | ORAL | 0 refills | Status: AC

## 2022-06-12 NOTE — Progress Notes (Signed)
*  PRELIMINARY RESULTS* °Echocardiogram °2D Echocardiogram has been performed. ° °Kathleen Paul M Farid Grigorian °08/02/2021, 11:56 AM °

## 2022-06-12 NOTE — Evaluation (Signed)
Physical Therapy Evaluation Patient Details Name: Kathleen Paul MRN: [id number] DOB: April 12, 1936 Today's Date: 06/12/2022  History of Present Illness  Patient is a 86 year old female with who presents with AMS and aphasia. Normal MRI appearance of the brain for age per report. History of bilateral knee surgery.  Clinical Impression  Patient agreeable to PT evaluation. Supportive daughter at the bedside. Patient is Mod I at baseline with ADLs and ambulation. She reports she is back to her baseline.  Today, the patient is likely at her baseline level of functional mobility. She was able to get OOB, stand, and ambulate 12f in the room using her own rollator without physical assistance from therapist. No apparent acute PT needs at this time. Patient is planning to d/c back to home with her daughter.      Recommendations for follow up therapy are one component of a multi-disciplinary discharge planning process, led by the attending physician.  Recommendations may be updated based on patient status, additional functional criteria and insurance authorization.  Follow Up Recommendations No PT follow up      Assistance Recommended at Discharge PRN  Patient can return home with the following  Assist for transportation    Equipment Recommendations None recommended by PT  Recommendations for Other Services       Functional Status Assessment Patient has not had a recent decline in their functional status     Precautions / Restrictions Precautions Precautions: Fall Restrictions Weight Bearing Restrictions: No      Mobility  Bed Mobility Overal bed mobility: Modified Independent                  Transfers Overall transfer level: Modified independent                      Ambulation/Gait Ambulation/Gait assistance: Supervision Gait Distance (Feet): 75 Feet Assistive device: Rollator (4 wheels) Gait Pattern/deviations: Decreased stride length, Trunk flexed Gait  velocity: decreased     General Gait Details: patient ambulated several laps in the room without physical assistance.  Stairs Stairs:  (not required for home mobility)          Wheelchair Mobility    Modified Rankin (Stroke Patients Only)       Balance Overall balance assessment: Needs assistance Sitting-balance support: Feet supported Sitting balance-Leahy Scale: Good     Standing balance support: Bilateral upper extremity supported Standing balance-Leahy Scale: Fair Standing balance comment: no external support from therapist. patient relying on rolling walker for support in standing               High Level Balance Comments: patient is able to maintain standing balance with brief single leg stand while donning slip on shoes while standing and using rollator for UE support.             Pertinent Vitals/Pain Pain Assessment Pain Assessment: No/denies pain    Home Living Family/patient expects to be discharged to:: Private residence Living Arrangements: Children Available Help at Discharge: Family Type of Home: House Home Access: Ramped entrance       Home Layout: Able to live on main level with bedroom/bathroom Home Equipment: Rollator (4 wheels);Cane - single point;Shower seat;Transport chair      Prior Function Prior Level of Function : Independent/Modified Independent             Mobility Comments: walking with 4 wheeled walker. Mod I for functional mobility. no falls in the past 6 months ADLs Comments:  Mod I for ADLs, medication management, cooking     Hand Dominance   Dominant Hand: Right    Extremity/Trunk Assessment   Upper Extremity Assessment Upper Extremity Assessment:  (impaired shoulder movement at baseline. overall WFL for tasks assessed)    Lower Extremity Assessment Lower Extremity Assessment: Overall WFL for tasks assessed (patellas are out of place bilaterally and patient and daughter report this is chronic in  nature.)       Communication   Communication: No difficulties  Cognition Arousal/Alertness: Awake/alert Behavior During Therapy: WFL for tasks assessed/performed Overall Cognitive Status: Within Functional Limits for tasks assessed                                 General Comments: patient is able to follow all commands without difficulty        General Comments General comments (skin integrity, edema, etc.): vitals stable throughout session. encourage short distance ambulation using rolling walker and being out of bed to chair for upright conditioning. patient used her own wheelchair cushion in the recliner chair for comfort    Exercises     Assessment/Plan    PT Assessment Patient does not need any further PT services  PT Problem List         PT Treatment Interventions      PT Goals (Current goals can be found in the Care Plan section)  Acute Rehab PT Goals Patient Stated Goal: to go home today PT Goal Formulation: All assessment and education complete, DC therapy    Frequency       Co-evaluation               AM-PAC PT "6 Clicks" Mobility  Outcome Measure Help needed turning from your back to your side while in a flat bed without using bedrails?: None Help needed moving from lying on your back to sitting on the side of a flat bed without using bedrails?: None Help needed moving to and from a bed to a chair (including a wheelchair)?: None Help needed standing up from a chair using your arms (e.g., wheelchair or bedside chair)?: None Help needed to walk in hospital room?: A Little Help needed climbing 3-5 steps with a railing? : A Little 6 Click Score: 22    End of Session   Activity Tolerance: Patient tolerated treatment well Patient left: in chair;with call bell/phone within reach;with nursing/sitter in room;with family/visitor present Nurse Communication: Mobility status PT Visit Diagnosis: Other symptoms and signs involving the nervous  system (R29.898)    Time: 3748-2707 PT Time Calculation (min) (ACUTE ONLY): 37 min   Charges:   PT Evaluation $PT Eval Low Complexity: 1 Low PT Treatments $Therapeutic Activity: 8-22 mins        Minna Merritts, PT, MPT   Percell Locus 06/12/2022, 10:26 AM

## 2022-06-12 NOTE — Progress Notes (Signed)
OT Cancellation Note  Patient Details Name: Kathleen Paul MRN: [id number] DOB: 17-Jan-1936   Cancelled Treatment:    Reason Eval/Treat Not Completed: OT screened, no needs identified, will sign off. Order received, chart reviewed. Per conversation with PT, pt at baseline functional independence with good family support. No skilled OT needs identified. Will sign off. Please re-consult if additional needs arise.   Dessie Coma, M.S. OTR/L  06/12/22, 10:25 AM  ascom [phone]

## 2022-06-12 NOTE — ED Notes (Signed)
Informed RN bed assigned 

## 2022-06-12 NOTE — Consult Note (Signed)
Neurology Consultation Reason for Consult: Transient difficulty speaking Referring Physician: Carlynn Spry  CC: Difficulty speaking  History is obtained from:patient, daughter  HPI:  Kathleen Paul is an 86 y.o. female with a PMHx of arthritis, atherosclrosis, HTN, remote lower extremity DVT (not on any anticoagulation), iron deficiency anemia, mild mitral regurgitation, CAD, CKD-3A, hypercholesterolemia, palpitation on metoprolol and hypothyroidism who presented to the ED with difficulty speaking. Symptoms were first noted last night when daughter woke the patient up to try to get some cold medication and noticed that she was saying strange things; at that time her daughter thought that it was because she had just woken her up. This morning when the patient got up she was not making any sense, slurring her words and saying incorrect words. Daughter felt that she was confused during that time. She did have a mild headache. She had no extremity numbness or weakness and no seizure-like activity. After arriving to the emergency department her symptoms significantly improved and she was back to her normal self at the time of EDP assessment. She has no prior history of TIA or CVA.    She has been admitted to the Hospitalist service. She has received 324 mg of aspirin. Home BP meds were held for permissive HTN. 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    LKW: 12/3, evening tpa given?: no, resolution of symptoms  Past Medical History:  Diagnosis Date   Anxiety state, unspecified    Arthritis    Atherosclerosis    Cancer ([place])    skin cancer   Diverticulosis    Esophageal stricture    Essential hypertension, benign    Gastric ulcer    Hiatal hernia    History of DVT (deep vein thrombosis) yrs ago   legs   IDA (iron deficiency anemia)    Inguinal hernia without mention of obstruction or gangrene, unilateral or unspecified,  (not specified as recurrent)    2014   Internal hemorrhoids    Mild mitral regurgitation    a. 04/2010 Echo: nl EF, mild diast dysfxn, trace PR, mild TR, mild-mod MR.   Non-obstructive CAD    a. 04/2010 Myoview: EF 77%, fixed apical/inferior defect;  b. 04/2010 Cath: LAD 60m LCX/RCA minor irregs, EF 60%;  c. 04/2011 Myoview: low risk.   Pure hypercholesterolemia    Rupture of left patellar tendon    05-26-2019   Scoliosis    UI (urinary incontinence)    Umbilical hernia without mention of obstruction or gangrene    2014   Unspecified hypothyroidism    Vitamin D deficiency      Family History  Problem Relation Age of Onset   Heart disease Mother    Heart attack Mother    Heart disease Father    Heart attack Father    Breast cancer Sister    Breast cancer Maternal Aunt    Heart disease Brother        sister     Social History:  reports that she quit smoking about 33 years ago. Her smoking use included cigarettes. She has a 15.00 pack-year smoking history. She has never used smokeless tobacco. She reports that she does not drink alcohol and does not use drugs.   Exam: Current vital signs: BP (!) 126/94 (BP Location: Right Arm)   Pulse (!) 56   Temp 98.2 F (36.8 C) (Oral)   Resp 16   Ht 5' (  1.524 m)   Wt 72.6 kg   SpO2 99%   BMI 31.25 kg/m  Vital signs in last 24 hours: Temp:  [98 F (36.7 C)-98.7 F (37.1 C)] 98.2 F (36.8 C) (12/04 0458) Pulse Rate:  [44-80] 56 (12/04 0800) Resp:  [13-23] 16 (12/04 0800) BP: (126-196)/(55-118) 126/94 (12/04 0800) SpO2:  [94 %-100 %] 99 % (12/04 0800)   Physical Exam  Constitutional: Appears well-developed and well-nourished.  Psych: Affect appropriate to situation Eyes: No scleral injection HENT: No OP obstruction MSK: no joint deformities.  Cardiovascular: Normal rate and regular rhythm.  Respiratory: Effort normal, non-labored breathing GI: Soft.  No distension. There is no tenderness.  Skin: WDI  Neuro: Mental  Status: Patient is awake, alert, oriented to person, place, month, year, and situation. Patient is able to give a clear and coherent history. No signs of aphasia or neglect Cranial Nerves: II: Visual Fields are full. Pupils are equal, round, and reactive to light.   III,IV, VI: EOMI without ptosis or diploplia.  V: Facial sensation is symmetric to temperature VII: Facial movement is symmetric.  VIII: hearing is intact to voice X: Uvula elevates symmetrically XI: Shoulder shrug is symmetric. XII: tongue is midline without atrophy or fasciculations.  Motor: Tone is normal. Bulk is normal. 5/5 strength was present in all four extremities.  Sensory: Sensation is symmetric to light touch and temperature in the arms and legs. Cerebellar: FNF and HKS are intact bilaterally    I have reviewed labs in epic and the results pertinent to this consultation are: LDL 69 A1c pending  I have reviewed the images obtained: CTA-vertebral stenosis, MRI-negative  Impression: 86 year old female with difficulty speaking that started on arousal from sleep.  Her family member was going to ask her for cold medicine, but found her to be confused.  The patient's description of simply not being able to get her words out is concerning for an aphasic episode as opposed to mild delirium associated with arousal from sleep, though I think that mild delirium is also possibility.  She does have sick contacts, and may be coming down with an early cold, but is currently not displaying URI symptoms.   Given the concerning the description, I would favor treating this as TIA.  She does have some vertebral artery stenosis, but I think that this would be asymptomatic.  Recommendations: 1) aspirin 81 mg daily and Plavix 75 mg daily x 3 weeks followed by aspirin 81 mg daily thereafter 2) continue current lipid control, LDL at goal 3) goal A1c less than seven 4) if no embolic source identified, would consider longer-term  cardiac monitoring given the possibility of TIA. 5) with return to normal no other recommendations other than the above, please call with further questions or concerns.   Roland Rack, MD [facility] [phone]  If 7pm- 7am, please page neurology on call as listed in [place].

## 2022-06-12 NOTE — Consult Note (Signed)
[facility] Vascular Consult Note  MRN : [id number]  Kathleen Paul is a 86 y.o. (Aug 24, 1935) female who presents with chief complaint of  Chief Complaint  Patient presents with   Altered Mental Status   Aphasia  .   Consulting Physician:Dr Max Sane MD.  Reason for consult:Stroke/Tia Symptoms with right vertebral Stenosis History of Present Illness:   Kathleen Paul is a 86 y.o. female with medical history significant of hypertension, hyperlipidemia, hypothyroidism, CAD, mitral valve regurgitation, anemia, remote DVT not on anticoagulation, gastric ulcer, esophageal stricture, palpitation on metoprolol, obesity with BMI 31.25, CKD-3A, who presents with difficulty speaking.   Per her son at the bedside, Kathleen Paul is living her daughter. Last night, Kathleen Paul was noticed to have difficulty speaking and stating strange things but daughter thought that it was because of sleeping interruption.  This morning patient continues to have difficulty speaking with slurred speech.  Patient has difficulty wording out.  No unilateral numbness or tingling in extremities, no facial droop or slurred speech.  No vision loss or hearing loss.  Her symptom has resolved in ED.   Patient does not chest pain, cough, shortness breath.  No fever or chills.  No nausea vomiting, diarrhea or abdominal pain pain or symptoms of UTI.  Patient states that she is easy to bruise when she took baby aspirin. Patient had been taking ASA but stopped it due to bruising.  Patient received total of 324 mg of aspirin today.    Current Facility-Administered Medications  Medication Dose Route Frequency Provider Last Rate Last Admin    stroke: early stages of recovery book   Does not apply Once Ivor Costa, MD       acetaminophen (TYLENOL) tablet 650 mg  650 mg Oral Q4H PRN Ivor Costa, MD       Or   acetaminophen (TYLENOL) 160 MG/5ML solution 650 mg  650 mg Per Tube Q4H PRN Ivor Costa, MD       Or   acetaminophen  (TYLENOL) suppository 650 mg  650 mg Rectal Q4H PRN Ivor Costa, MD       enoxaparin (LOVENOX) injection 30 mg  30 mg Subcutaneous Q24H Ivor Costa, MD   30 mg at 06/12/22 0951   hydrALAZINE (APRESOLINE) injection 5 mg  5 mg Intravenous Q2H PRN Ivor Costa, MD       levothyroxine (SYNTHROID) tablet 88 mcg  88 mcg Oral Q0600 Ivor Costa, MD   88 mcg at 06/12/22 0531   metoprolol tartrate (LOPRESSOR) tablet 25 mg  25 mg Oral QHS Ivor Costa, MD   25 mg at 06/11/22 2204   ondansetron (ZOFRAN) injection 4 mg  4 mg Intravenous Q8H PRN Ivor Costa, MD       pantoprazole (PROTONIX) EC tablet 40 mg  40 mg Oral Q1200 Ivor Costa, MD       senna-docusate (Senokot-S) tablet 1 tablet  1 tablet Oral QHS PRN Ivor Costa, MD       simvastatin (ZOCOR) tablet 20 mg  20 mg Oral QPM Ivor Costa, MD   20 mg at 06/11/22 2205   traMADol (ULTRAM) tablet 50 mg  50 mg Oral Daily PRN Ivor Costa, MD   50 mg at 06/11/22 2210   Current Outpatient Medications  Medication Sig Dispense Refill   acetaminophen (TYLENOL) 500 MG tablet Take 2 tablets (1,000 mg total) by mouth every 8 (eight) hours. 30 tablet 0   aspirin EC 81 MG tablet Take 81 mg by mouth daily.  Swallow whole.     Calcium-Magnesium-Vitamin D (CALCIUM 1200+D3 PO) Take 1 tablet by mouth daily.     enalapril (VASOTEC) 10 MG tablet Take 1 tablet (10 mg total) by mouth daily. for blood pressure. 90 tablet 1   levothyroxine (SYNTHROID) 88 MCG tablet Take 1 tablet by mouth every morning on an empty stomach with water only.  No food or other medications for 30 minutes. 90 tablet 2   metoprolol tartrate (LOPRESSOR) 25 MG tablet TAKE 1 TABLET BY MOUTH EVERY EVENING 90 tablet 2   Multiple Vitamin (MULTIVITAMIN PO) Take 1 tablet by mouth daily.     simvastatin (ZOCOR) 20 MG tablet Take 1 tablet (20 mg total) by mouth every evening. for cholesterol. 90 tablet 2   traMADol (ULTRAM) 50 MG tablet Take 1 tablet (50 mg total) by mouth daily as needed. For moderate pain. 90 tablet 0    hydrochlorothiazide (HYDRODIURIL) 25 MG tablet Take 1 tablet (25 mg total) by mouth daily. for blood pressure. (Patient not taking: Reported on 06/11/2022) 90 tablet 1    Past Medical History:  Diagnosis Date   Anxiety state, unspecified    Arthritis    Atherosclerosis    Cancer ([place])    skin cancer   Diverticulosis    Esophageal stricture    Essential hypertension, benign    Gastric ulcer    Hiatal hernia    History of DVT (deep vein thrombosis) yrs ago   legs   IDA (iron deficiency anemia)    Inguinal hernia without mention of obstruction or gangrene, unilateral or unspecified, (not specified as recurrent)    2014   Internal hemorrhoids    Mild mitral regurgitation    a. 04/2010 Echo: nl EF, mild diast dysfxn, trace PR, mild TR, mild-mod MR.   Non-obstructive CAD    a. 04/2010 Myoview: EF 77%, fixed apical/inferior defect;  b. 04/2010 Cath: LAD 49m LCX/RCA minor irregs, EF 60%;  c. 04/2011 Myoview: low risk.   Pure hypercholesterolemia    Rupture of left patellar tendon    05-26-2019   Scoliosis    UI (urinary incontinence)    Umbilical hernia without mention of obstruction or gangrene    2014   Unspecified hypothyroidism    Vitamin D deficiency     Past Surgical History:  Procedure Laterality Date   ABDOMINAL HYSTERECTOMY  2008   complete    BIOPSY  04/02/2018   Procedure: BIOPSY;  Surgeon: MIrving Copas, MD;  Location: [facility];  Service: Gastroenterology;;   CARDIAC CATHETERIZATION  2011   [facility]   COLONOSCOPY  2003   [facility]   ESOPHAGOGASTRODUODENOSCOPY (EGD) WITH PROPOFOL N/A 04/02/2018   Procedure: ESOPHAGOGASTRODUODENOSCOPY (EGD) WITH PROPOFOL;  Surgeon: MIrving Copas, MD;  [facility];  Service: Gastroenterology;  Laterality: N/A;   FOREIGN BODY REMOVAL  04/02/2018   Procedure: FOREIGN BODY REMOVAL;  Surgeon: MRush LandmarkGTelford Nab, MD;  Location: [facility];  Service: Gastroenterology;;   HERNIA REPAIR Right  09/23/2012   repair [facility]   LAPAROSCOPIC HYSTERECTOMY     PATELLAR TENDON REPAIR Left 06/03/2019   Procedure: LEFT PATELLA TENDON REPAIR, RIGHT KNEE INJECTION.;  Surgeon: OParalee Cancel MD;  Location: [facility];  Service: Orthopedics;  Laterality: Left;   PATELLAR TENDON REPAIR Left 07/01/2019   Procedure: OPEN PATELLA TENDON REPAIR;  Surgeon: OParalee Cancel MD;  Location: [facility];  Service: Orthopedics;  Laterality: Left;   TONSILLECTOMY     TOTAL KNEE ARTHROPLASTY Left 02/11/2019   Procedure: TOTAL  KNEE ARTHROPLASTY;  Surgeon: Paralee Cancel, MD;  Location: [facility];  Service: Orthopedics;  Laterality: Left;  70 mins    Social History Social History   Tobacco Use   Smoking status: Former    Packs/day: 0.50    Years: 30.00    Total pack years: 15.00    Types: Cigarettes    Quit date: 07/10/1988    Years since quitting: 33.9   Smokeless tobacco: Never  Vaping Use   Vaping Use: Never used  Substance Use Topics   Alcohol use: No   Drug use: Never    Family History Family History  Problem Relation Age of Onset   Heart disease Mother    Heart attack Mother    Heart disease Father    Heart attack Father    Breast cancer Sister    Breast cancer Maternal Aunt    Heart disease Brother        sister    Allergies  Allergen Reactions   Codeine Nausea Only     REVIEW OF SYSTEMS (Negative unless checked)  Constitutional: '[]'$ Weight loss  '[]'$ Fever  '[]'$ Chills Cardiac: '[]'$ Chest pain   '[]'$ Chest pressure   '[]'$ Palpitations   '[]'$ Shortness of breath when laying flat   '[]'$ Shortness of breath at rest   '[]'$ Shortness of breath with exertion. Vascular:  '[]'$ Pain in legs with walking   '[]'$ Pain in legs at rest   '[]'$ Pain in legs when laying flat   '[]'$ Claudication   '[]'$ Pain in feet when walking  '[]'$ Pain in feet at rest  '[]'$ Pain in feet when laying flat   '[]'$ History of DVT   '[]'$ Phlebitis   '[]'$ Swelling in legs   '[]'$ Varicose veins   '[]'$ Non-healing ulcers Pulmonary:   '[]'$ Uses home oxygen   '[]'$ Productive cough   '[]'$ Hemoptysis    '[]'$ Wheeze  '[]'$ COPD   '[]'$ Asthma Neurologic:  '[]'$ Dizziness  '[]'$ Blackouts   '[]'$ Seizures   '[]'$ History of stroke   '[]'$ History of TIA  '[]'$ Aphasia   '[]'$ Temporary blindness   '[]'$ Dysphagia   '[]'$ Weakness or numbness in arms   '[]'$ Weakness or numbness in legs Musculoskeletal:  '[]'$ Arthritis   '[]'$ Joint swelling   '[]'$ Joint pain   '[]'$ Low back pain Hematologic:  '[x]'$ Easy bruising  '[]'$ Easy bleeding   '[]'$ Hypercoagulable state   '[]'$ Anemic  '[]'$ Hepatitis Gastrointestinal:  '[]'$ Blood in stool   '[]'$ Vomiting blood  '[]'$ Gastroesophageal reflux/heartburn   '[]'$ Difficulty swallowing. Genitourinary:  '[]'$ Chronic kidney disease   '[]'$ Difficult urination  '[]'$ Frequent urination  '[]'$ Burning with urination   '[]'$ Blood in urine Skin:  '[]'$ Rashes   '[]'$ Ulcers   '[]'$ Wounds Psychological:  '[]'$ History of anxiety   '[]'$  History of major depression.  Physical Examination  Vitals:   06/12/22 0458 06/12/22 0608 06/12/22 0800 06/12/22 1234  BP:  138/83 (!) 126/94 (!) 144/74  Pulse:  (!) 59 (!) 56 63  Resp:  '18 16 16  '$ Temp: 98.2 F (36.8 C)     TempSrc: Oral     SpO2:  97% 99% 97%  Weight:      Height:       Body mass index is 31.25 kg/m. Gen:  WD/WN, NAD Head: [place]/AT, No temporalis wasting. Prominent temp pulse not noted. Ear/Nose/Throat: Hearing grossly intact, nares w/o erythema or drainage, oropharynx w/o Erythema/Exudate Eyes: Sclera non-icteric, conjunctiva clear Neck: Trachea midline.  No JVD.  Pulmonary:  Good air movement, respirations not labored, equal bilaterally.  Cardiac: Irregular Rhythm, normal S1, S2. Vascular: Palpable pulses throughout. No carotid Bruit noted. No JVD  Vessel Right Left  Radial Palpable Palpable  Ulnar Palpable Palpable  Brachial  Palpable Palpable  Carotid Palpable, without bruit Palpable, without bruit  Aorta Not palpable N/A  Femoral Palpable Palpable  Popliteal Palpable Palpable  Kathleen Paul Palpable Palpable  DP Palpable Palpable   Gastrointestinal: soft, non-tender/non-distended. No guarding/reflex.  Musculoskeletal: M/S 5/5  throughout.  Extremities without ischemic changes.  No deformity or atrophy. No edema. Neurologic: AAOX4 Crainial nerves grossly intact. Sensation grossly intact in extremities.  Symmetrical.  Speech is fluent. Motor exam as listed above. Psychiatric: Judgment intact, Mood & affect appropriate for Kathleen Paul's clinical situation. Dermatologic: No rashes or ulcers noted.  No cellulitis or open wounds. Lymph : No Cervical, Axillary, or Inguinal lymphadenopathy.    CBC Lab Results  Component Value Date   WBC 5.4 06/11/2022   HGB 10.1 (L) 06/11/2022   HCT 31.8 (L) 06/11/2022   MCV 92.7 06/11/2022   PLT 230 06/11/2022    BMET    Component Value Date/Time   NA 139 06/11/2022 1117   K 4.3 06/11/2022 1117   CL 104 06/11/2022 1117   CO2 25 06/11/2022 1117   GLUCOSE 113 (H) 06/11/2022 1117   BUN 50 (H) 06/11/2022 1117   CREATININE 1.34 (H) 06/11/2022 1117   CREATININE 1.28 (H) 01/31/2018 0937   CALCIUM 9.4 06/11/2022 1117   GFRNONAA 39 (L) 06/11/2022 1117   GFRNONAA 39 (L) 01/31/2018 0937   GFRAA 56 (L) 07/02/2019 0357   GFRAA 46 (L) 01/31/2018 8676   Estimated Creatinine Clearance: 27.8 mL/min (A) (by C-G formula based on SCr of 1.34 mg/dL (H)).  COAG Lab Results  Component Value Date   INR 1.2 06/11/2022   INR 1.0 01/28/2019   INR 1.17 11/09/2014    Radiology CT ANGIO HEAD NECK W WO CM  Result Date: 06/11/2022 CLINICAL DATA:  Transient ischemic attack EXAM: CT ANGIOGRAPHY HEAD AND NECK TECHNIQUE: Multidetector CT imaging of the head and neck was performed using the standard protocol during bolus administration of intravenous contrast. Multiplanar CT image reconstructions and MIPs were obtained to evaluate the vascular anatomy. Carotid stenosis measurements (when applicable) are obtained utilizing NASCET criteria, using the distal internal carotid diameter as the denominator. RADIATION DOSE REDUCTION: This exam was performed according to the departmental dose-optimization program which  includes automated exposure control, adjustment of the mA and/or kV according to patient size and/or use of iterative reconstruction technique. CONTRAST:  53m OMNIPAQUE IOHEXOL 350 MG/ML SOLN COMPARISON:  None Available. FINDINGS: CTA NECK FINDINGS SKELETON: There is no bony spinal canal stenosis. No lytic or blastic lesion. OTHER NECK: Normal pharynx, larynx and major salivary glands. No cervical lymphadenopathy. Unremarkable thyroid gland. UPPER CHEST: Hiatal hernia AORTIC ARCH: There is calcific atherosclerosis of the aortic arch. There is no aneurysm, dissection or hemodynamically significant stenosis of the visualized portion of the aorta. Conventional 3 vessel aortic branching pattern. The visualized proximal subclavian arteries are widely patent. RIGHT CAROTID SYSTEM: No dissection, occlusion or aneurysm. Mild atherosclerotic calcification at the carotid bifurcation without hemodynamically significant stenosis. LEFT CAROTID SYSTEM: No dissection, occlusion or aneurysm. Mild atherosclerotic calcification at the carotid bifurcation without hemodynamically significant stenosis. VERTEBRAL ARTERIES: Codominant configuration. The right vertebral artery is severely narrowed at the C1-2 level but remains patent to the skull base. The left vertebral artery is normal. CTA HEAD FINDINGS POSTERIOR CIRCULATION: --Vertebral arteries: Normal V4 segments. --Inferior cerebellar arteries: Normal. --Basilar artery: Normal. --Superior cerebellar arteries: Normal. --Posterior cerebral arteries (PCA): Normal. ANTERIOR CIRCULATION: --Intracranial internal carotid arteries: Normal. --Anterior cerebral arteries (ACA): Normal. Both A1 segments are present. Patent anterior communicating artery (a-comm). --  Middle cerebral arteries (MCA): Normal. VENOUS SINUSES: As permitted by contrast timing, patent. ANATOMIC VARIANTS: Fetal origin of the left posterior cerebral artery. Review of the MIP images confirms the above findings. IMPRESSION:  1. No emergent large vessel occlusion. 2. 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 3. Hiatal hernia. 4. Mild bilateral carotid bifurcation atherosclerosis without hemodynamically significant stenosis. 5. Aortic atherosclerosis (ICD10-I70.0). Electronically Signed   By: Ulyses Jarred M.D.   On: 06/11/2022 21:23   MR ANGIO HEAD WO CONTRAST  Result Date: 06/11/2022 CLINICAL DATA:  Expressive aphasia beginning last night with some improvement. EXAM: MRA HEAD WITHOUT CONTRAST TECHNIQUE: Angiographic images of the Circle of Willis were acquired using MRA technique without intravenous contrast. COMPARISON:  None Available. FINDINGS: Anterior circulation: Internal carotid arteries are within normal limits from the high cervical segments through the ICA termini. The A1 and M1 segments are normal. The anterior communicating scratched at no definite anterior communicating artery is present. ACA and MCA branch vessels are within normal limits. Posterior circulation: The left vertebral artery is dominant vessel. Abnormal signal is present in the right vertebral artery suggesting slow flow. More normal signal is present in the very distal segment. Vertebrobasilar junction basilar artery are normal. Right posterior cerebral artery originates from basilar tip. Left posterior cerebral artery is of fetal type. PCA branch vessels are within normal limits bilaterally. Anatomic variants: Fetal type left posterior cerebral artery. Other: None. IMPRESSION: 1. Abnormal signal in the right vertebral artery suggesting slow flow. 2. Otherwise normal MRA circle-of-Willis without other significant proximal stenosis, aneurysm, or branch vessel occlusion. These results were called by telephone at the time of interpretation on 06/11/2022 at 7:10 pm to provider Dr. Jori Moll, Who verbally acknowledged these results. Electronically Signed   By: San Morelle M.D.   On: 06/11/2022 19:10   MR BRAIN WO CONTRAST  Result  Date: 06/11/2022 CLINICAL DATA:  TIA. Expressive aphasia beginning last evening. Confusion. EXAM: MRI HEAD WITHOUT CONTRAST TECHNIQUE: Multiplanar, multiecho pulse sequences of the brain and surrounding structures were obtained without intravenous contrast. COMPARISON:  CT head without contrast 06/11/2022 FINDINGS: Brain: No acute infarct, hemorrhage, or mass lesion is present. Mild atrophy is within normal limits for age. No significant white matter lesions are present. The ventricles are of normal size. Deep brain nuclei are within normal limits. No significant extraaxial fluid collection is present. The internal auditory canals are within normal limits. The brainstem and cerebellum are within normal limits. Vascular: Flow is present in the major intracranial arteries. Skull and upper cervical spine: The craniocervical junction is normal. Upper cervical spine is within normal limits. Marrow signal is unremarkable. Sinuses/Orbits: The paranasal sinuses and mastoid air cells are clear. The globes and orbits are within normal limits. IMPRESSION: Normal MRI appearance of the brain for age. No acute or focal lesion to explain expressive aphasia. Electronically Signed   By: San Morelle M.D.   On: 06/11/2022 19:01   US Carotid Bilateral (at [facility] and [facility] only)  Result Date: 06/11/2022 CLINICAL DATA:  Transient ischemic attack EXAM: BILATERAL CAROTID DUPLEX ULTRASOUND TECHNIQUE: Pearline Cables scale imaging, color Doppler and duplex ultrasound were performed of bilateral carotid and vertebral arteries in the neck. COMPARISON:  None Available. FINDINGS: Criteria: Quantification of carotid stenosis is based on velocity parameters that correlate the residual internal carotid diameter with NASCET-based stenosis levels, using the diameter of the distal internal carotid lumen as the denominator for stenosis measurement. The following velocity measurements were obtained: RIGHT ICA: 84  cm/sec CCA: 741 cm/sec SYSTOLIC ICA/CCA  RATIO:  0.8 ECA: 129 cm/sec LEFT ICA: 98 cm/sec CCA: 28 cm/sec SYSTOLIC ICA/CCA RATIO:  0.9 ECA: 104 cm/sec RIGHT CAROTID ARTERY: Scattered calcified and noncalcified plaque. RIGHT VERTEBRAL ARTERY: Antegrade flow in the right vertebral artery. LEFT CAROTID ARTERY:  Scattered calcified and noncalcified plaque. LEFT VERTEBRAL ARTERY:  Antegrade flow in the left vertebral artery. IMPRESSION: No significant stenosis in the right or left internal carotid arteries. Scattered calcified noncalcified plaque is identified bilaterally. Antegrade flow in the bilateral vertebral arteries. Electronically Signed   By: Dorise Bullion III M.D.   On: 06/11/2022 18:46   CT HEAD WO CONTRAST  Result Date: 06/11/2022 CLINICAL DATA:  I assume it deposited on mental status change with unknown cause EXAM: CT HEAD WITHOUT CONTRAST TECHNIQUE: Contiguous axial images were obtained from the base of the skull through the vertex without intravenous contrast. RADIATION DOSE REDUCTION: This exam was performed according to the departmental dose-optimization program which includes automated exposure control, adjustment of the mA and/or kV according to patient size and/or use of iterative reconstruction technique. COMPARISON:  Head CT 11/09/2014 FINDINGS: Brain: No evidence of acute infarction, hemorrhage, hydrocephalus, extra-axial collection or mass lesion/mass effect. Vascular: No hyperdense vessel or unexpected calcification. Skull: Normal. Negative for fracture or focal lesion. Sinuses/Orbits: No acute finding. IMPRESSION: No acute finding.  Unremarkable for age. Electronically Signed   By: Jorje Guild M.D.   On: 06/11/2022 12:58      Assessment/Plan 1. Aphasia with vertebral stenosis.       Patient MRA and CTA look like a more distal lesion the the right vertebral artery and is not atretic and does not look atherosclerotic. The left vertebral artery is clearly the dominant circle of willisand is intact. There is no acute  strokeon the MRA of the brain. Given these findings I would favor dual antiplatelet therapy lifelong as opposed to any further angiography or intervention.    2. Continue antihypertensive medications as already ordered, these medications have been reviewed and there are no changes at this time.  3. Continue statin as ordered and reviewed, no changes at this time  Plan of care discussed with Dr Hortencia Pilar and he is in agreement with plan noted above.   Family Communication:  Total Time:75 I spent 75 minutes in this encounter including personally reviewing extensive medical records, personally reviewing imaging studies and compared to prior scans, counseling the patient, placing orders, coordinating care and performing appropriate documentation  Thank you for allowing [place] to participate in the care of this patient.   Drema Pry, NP [facility] [phone] (Office Phone) [phone] (Office Fax) [phone] (Pager)  06/12/2022 12:35 PM  Staff may message me via secure chat in [place]  but this may not receive immediate response,  please page for urgent matters!  Dictation software was used to generate the above note. Typos may occur and escape review, as with typed/written notes. Any error is purely unintentional.  Please contact me directly for clarity if needed.

## 2022-06-12 NOTE — ED Notes (Signed)
Pt appears to be sleeping, observe even RR and unlabored, NAD noted, side rails up x2 for safety, plan of care ongoing, call light within reach, no further concerns as of present.  Daughter at the bedside

## 2022-06-12 NOTE — Discharge Instructions (Signed)
   Heart Monitor:   Length of Wear: 14 days    Your physician has recommended that you wear a Zio XT monitor.    This monitor is a medical device that records the heart's electrical activity. Doctors most often use these monitors to diagnose arrhythmias. Arrhythmias are problems with the speed or rhythm of the heartbeat. The monitor is a small device applied to your chest. You can wear one while you do your normal daily activities. While wearing this monitor if you have any symptoms to push the button and record what you felt. Once you have worn this monitor for the period of time provider prescribed (Usually 14 days), you will return the monitor device in the postage paid box. Once it is returned they will download the data collected and provide [place] with a report which the provider will then review and we will call you with those results. Important tips:   1. Avoid showering during the first 24 hours of wearing the monitor. 2. Avoid excessive sweating to help maximize wear time. 3. Do not submerge the device, no hot tubs, and no swimming pools. 4. Keep any lotions or oils away from the patch. 5. After 24 hours you may shower with the patch on. Take brief showers with your back facing the shower head.  6. Do not remove patch once it has been placed because that will interrupt data and decrease adhesive wear time. 7. Push the button when you have any symptoms and write down what you were feeling. 8. Once you have completed wearing your monitor, remove and place into box which has postage paid and place in your outgoing mailbox.  9. If for some reason you have misplaced your box then call our office and we can provide another box and/or mail it off for you.

## 2022-06-12 NOTE — Progress Notes (Signed)
SLP Cancellation Note  Patient Details Name: Kathleen Paul MRN: [id number] DOB: 10-09-1935   Cancelled treatment:       Reason Eval/Treat Not Completed: SLP screened, no needs identified, will sign off (chart reviewed; consulted NSG then met w/ pt and Dtr in room) Pt denied any difficulty swallowing and is currently on a regular diet; tolerates swallowing pills w/ water per NSG. Pt conversed in conversation w/out expressive/receptive deficits noted; pt denied any speech-language deficits. Speech clear, fully intelligible. Pt stated her speech is now back to her Baseline; Dtr agreed.. No further skilled ST services indicated as pt appears at her baseline. Pt agreed. NSG to reconsult if any change in status while admitted.       Orinda Kenner, MS, CCC-SLP Speech Language Pathologist Rehab Services; [place] [phone] (ascom) Mylynn Dinh 06/12/2022, 1:01 PM

## 2022-06-13 DIAGNOSIS — R002 Palpitations: Secondary | ICD-10-CM | POA: Diagnosis not present

## 2022-06-13 DIAGNOSIS — G459 Transient cerebral ischemic attack, unspecified: Secondary | ICD-10-CM | POA: Diagnosis not present

## 2022-06-13 LAB — HEMOGLOBIN A1C
Hgb A1c MFr Bld: 5.5 % (ref 4.8–5.6)
Mean Plasma Glucose: 111 mg/dL

## 2022-06-15 ENCOUNTER — Telehealth: Payer: Self-pay

## 2022-06-15 NOTE — Progress Notes (Addendum)
Chronic Care Management Pharmacy Assistant   Name: Kathleen Paul  MRN: [id number] DOB: 1936-06-27  Reason for Encounter: CCM [facility] Follow-Up)  Medications: Outpatient Encounter Medications as of 06/15/2022  Medication Sig   acetaminophen (TYLENOL) 500 MG tablet Take 2 tablets (1,000 mg total) by mouth every 8 (eight) hours.   aspirin EC 81 MG tablet Take 1 tablet (81 mg total) by mouth daily. Swallow whole. Aspirin & plavix for 21 days, f/by aspirin alone   Calcium-Magnesium-Vitamin D (CALCIUM 1200+D3 PO) Take 1 tablet by mouth daily.   clopidogrel (PLAVIX) 75 MG tablet Take 1 tablet (75 mg total) by mouth daily for 21 days. Aspirin & plavix for 21 days, f/by aspirin alone   enalapril (VASOTEC) 10 MG tablet Take 1 tablet (10 mg total) by mouth daily. for blood pressure.   levothyroxine (SYNTHROID) 88 MCG tablet Take 1 tablet by mouth every morning on an empty stomach with water only.  No food or other medications for 30 minutes.   metoprolol tartrate (LOPRESSOR) 25 MG tablet TAKE 1 TABLET BY MOUTH EVERY EVENING   Multiple Vitamin (MULTIVITAMIN PO) Take 1 tablet by mouth daily.   simvastatin (ZOCOR) 20 MG tablet Take 1 tablet (20 mg total) by mouth every evening. for cholesterol.   traMADol (ULTRAM) 50 MG tablet Take 1 tablet (50 mg total) by mouth daily as needed. For moderate pain.   No facility-administered encounter medications on file as of 06/15/2022.   Reviewed hospital notes for details of recent visit. Has patient been contacted by Transitions of Care team? No Has patient seen PCP/specialist for hospital follow up (summarize OV if yes): No  Admitted to the ED on 06/11/2022. Discharge date was 06/12/2022.  Discharged from [facility].   Discharge diagnosis (Principal Problem): Stroke-like symptoms  Patient was discharged to Home  Brief summary of hospital course: Differential diagnosis including TIA/CVA, intracranial hemorrhage, electrolyte abnormality,  urinary tract infection, dysrhythmia   On chart review patient has a history of heart palpitations and takes metoprolol prior cardiac catheterization 2000 1150% mid LAD   On arrival to the emergency department afebrile and hemodynamically stable.  Initially was tachycardic on triage vital signs however repeat vital signs in the room patient is no longer tachycardic  Added on urine and COVID and influenza testing however no cough or other symptoms concerning for pneumonia. Patient is back to her baseline and has a normal neurologic exam. Clinical picture is most concerning for TIA/CVA. ABCD score of 5. Consulted hospitalist for admission for TIA/CVA   Neurologist (Dr Leonel Ramsay) recommendations: 1) aspirin 81 mg daily and Plavix 75 mg daily x 3 weeks followed by aspirin 81 mg daily thereafter 2) continue current lipid control, LDL at goal 3) goal A1c less than seven 4) if no embolic source identified, would consider longer-term cardiac monitoring given the possibility of TIA. 5) with return to normal no other recommendations other than the above, please call with further questions or concerns.   Vascular (NP Annalee Genta) recommendations: 1. Aphasia with vertebral stenosis.      Patient MRA and CTA look like a more distal lesion the the right vertebral artery and is not atretic and does not look atherosclerotic. The left vertebral artery is clearly the dominant circle of willisand is intact. There is no acute strokeon the MRA of the brain. Given these findings I would favor dual antiplatelet therapy lifelong as opposed to any further angiography or intervention.    New?Medications Started at [facility] Discharge:?? -Started  clopidogrel (PLAVIX) 75 MG tablet x 3 weeks   Medication Changes at Hospital Discharge: -Changed aspirin EC 81 MG tablet   Medications Discontinued at Hospital Discharge: -Stopped hydrochlorothiazide (HYDRODIURIL) 25 MG tablet   Medications that remain the same after  Hospital Discharge:??  -All other medications will remain the same.       Next CCM appt: Non scheduled  Other upcoming appts: PCP appointment on 06/16/2022 for [place], PharmD notified and will determine if action is needed.  [name], [place] Clinical Pharmacy Assistant [phone]   Pharmacist addendum: DAPT length of therapy is unclear as vascular and neurology had different opinions (neurology rec'd DAPT x 3 weeks then aspirin alone, vascular rec'd DAPT indefinitely). PCP has advised pt to ask neurology at upcoming visit.  [name], PharmD, BCACP 06/30/22 10:55 AM

## 2022-06-16 ENCOUNTER — Ambulatory Visit (INDEPENDENT_AMBULATORY_CARE_PROVIDER_SITE_OTHER): Payer: PPO | Admitting: Primary Care

## 2022-06-16 ENCOUNTER — Encounter: Payer: Self-pay | Admitting: Primary Care

## 2022-06-16 VITALS — BP 126/82 | HR 75 | Temp 97.9°F | Ht 60.0 in | Wt 152.0 lb

## 2022-06-16 DIAGNOSIS — G459 Transient cerebral ischemic attack, unspecified: Secondary | ICD-10-CM

## 2022-06-16 DIAGNOSIS — N3281 Overactive bladder: Secondary | ICD-10-CM | POA: Diagnosis not present

## 2022-06-16 DIAGNOSIS — I10 Essential (primary) hypertension: Secondary | ICD-10-CM | POA: Diagnosis not present

## 2022-06-16 DIAGNOSIS — M12811 Other specific arthropathies, not elsewhere classified, right shoulder: Secondary | ICD-10-CM | POA: Diagnosis not present

## 2022-06-16 DIAGNOSIS — M12812 Other specific arthropathies, not elsewhere classified, left shoulder: Secondary | ICD-10-CM | POA: Diagnosis not present

## 2022-06-16 MED ORDER — OXYBUTYNIN CHLORIDE ER 5 MG PO TB24: 5.0000 mg | ORAL_TABLET | Freq: Every day | ORAL | 0 refills | Status: DC

## 2022-06-16 NOTE — Assessment & Plan Note (Signed)
Controlled.  Continue enalapril 10 mg daily only.

## 2022-06-16 NOTE — Assessment & Plan Note (Addendum)
Recent hospitalization. Reviewed ED notes, labs, imaging reviewed.  Symptoms remain resolved. Exam today stable.   Continue Plavix 75 mg daily x 21 days, continue aspirin 81 mg daily. Unclear if she needs needs DAPT lifelong or not as the notes are conflicting between vascular services and hospitalist.  She will ask neurology during her upcoming visit.   Continue simvastatin.

## 2022-06-16 NOTE — Progress Notes (Signed)
Subjective:    Patient ID: Kathleen Paul, female    DOB: 25-Jul-1935, 86 y.o.   MRN: [id number]  HPI  Kathleen Paul is a very pleasant 86 y.o. female with a history of TIA, CAD, hypothyroidism, severe osteoarthritis to multiple joints, CKD, dyslipidemia, GAD, hypertension who presents today for hospital follow-up. She would also like to discuss urinary incontinence.  Her daughter joins [place] today.   1) TIA: She presented to [facility] ED on 06/11/2022 for slurred speech, saying incorrect words, confusion that began earlier that morning.  During her stay in the ED she was noted to be hypertensive with blood pressure of 179/90, tachycardia with rate of 110.  She underwent CT head which was negative for acute intracranial abnormalities.  Given her symptoms she was admitted for further evaluation.  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  neurology consulted who recommended aspirin 81 mg daily, clopidogrel 75 mg daily x 3 weeks with discontinuation thereafter.  Vascular services consulted who did not see acute stroke on MRA.  They recommended dual antiplatelet therapy lifelong as opposed to angiography or intervention.  She underwent echocardiogram which showed LVEF of 50 to 55%, normal right ventricular function, grossly normal valves.  She was placed on a Holter monitor for evaluation of atrial fibrillation.  She was discharged home on 06/12/2022.   Since her hospital stay she denies confusion, slurred speech, dizziness. She still has her Holter Monitor. She has an appointment scheduled with Neurology for the 19th of December. She's checking her BP which has been closer to baseline. She is only taking enalapril 10 mg daily. She discontinued her HCTZ.   She is also needing her hadicap placard renewed.   BP Readings from Last 3 Encounters:  06/16/22 126/82  06/12/22 (!) 143/63  02/15/22  118/74   2) Overactive Bladder: Chronic for years, worse over the last one year. Symptoms include leakage and bladder control loss. She underwent hysterectomy in 2008.    Review of Systems  Respiratory:  Negative for shortness of breath.   Cardiovascular:  Negative for chest pain.  Neurological:  Negative for dizziness and speech difficulty.  Psychiatric/Behavioral:  Negative for confusion.          Past Medical History:  Diagnosis Date   Anxiety state, unspecified    Arthritis    Atherosclerosis    Cancer ([place])    skin cancer   Diverticulosis    Esophageal stricture    Essential hypertension, benign    Gastric ulcer    Hiatal hernia    History of DVT (deep vein thrombosis) yrs ago   legs   IDA (iron deficiency anemia)    Inguinal hernia without mention of obstruction or gangrene, unilateral or unspecified, (not specified as recurrent)    2014   Internal hemorrhoids    Mild mitral regurgitation    a. 04/2010 Echo: nl EF, mild diast dysfxn, trace PR, mild TR, mild-mod MR.   Non-obstructive CAD    a. 04/2010 Myoview: EF 77%, fixed apical/inferior defect;  b. 04/2010 Cath: LAD 29m LCX/RCA minor irregs, EF 60%;  c. 04/2011 Myoview: low risk.   Pure hypercholesterolemia    Rupture of left patellar tendon    05-26-2019   Scoliosis    UI (urinary incontinence)    Umbilical hernia without mention of obstruction or gangrene    2014   Unspecified hypothyroidism  Vitamin D deficiency     Social History   Socioeconomic History   Marital status: Widowed    Spouse name: Not on file   Number of children: Not on file   Years of education: Not on file   Highest education level: Not on file  Occupational History   Not on file  Tobacco Use   Smoking status: Former    Packs/day: 0.50    Years: 30.00    Total pack years: 15.00    Types: Cigarettes    Quit date: 07/10/1988    Years since quitting: 33.9   Smokeless tobacco: Never  Vaping Use   Vaping Use: Never used   Substance and Sexual Activity   Alcohol use: No   Drug use: Never   Sexual activity: Not on file  Other Topics Concern   Not on file  Social History Narrative   Not on file   Social Determinants of Health   Financial Resource Strain: Low Risk  (12/14/2021)   Overall Financial Resource Strain (CARDIA)    Difficulty of Paying Living Expenses: Not hard at all  Food Insecurity: No Food Insecurity (12/14/2021)   Hunger Vital Sign    Worried About Running Out of Food in the Last Year: Never true    [place] in the Last Year: Never true  Transportation Needs: No Transportation Needs (12/14/2021)   PRAPARE - Hydrologist (Medical): No    Lack of Transportation (Non-Medical): No  Physical Activity: Sufficiently Active (12/14/2021)   Exercise Vital Sign    Days of Exercise per Week: 5 days    Minutes of Exercise per Session: 30 min  Stress: No Stress Concern Present (12/14/2021)   [place]    Feeling of Stress : Not at all  Social Connections: Moderately Isolated (12/14/2021)   Social Connection and Isolation Panel [NHANES]    Frequency of Communication with Friends and Family: More than three times a week    Frequency of Social Gatherings with Friends and Family: More than three times a week    Attends Religious Services: 1 to 4 times per year    Active Member of [employer] or Organizations: No    Attends Archivist Meetings: Never    Marital Status: Widowed  Intimate Partner Violence: Not At Risk (12/14/2021)   Humiliation, Afraid, Rape, and Kick questionnaire    Fear of Current or Ex-Partner: No    Emotionally Abused: No    Physically Abused: No    Sexually Abused: No    Past Surgical History:  Procedure Laterality Date   ABDOMINAL HYSTERECTOMY  2008   complete    BIOPSY  04/02/2018   Procedure: BIOPSY;  Surgeon: Irving Copas., MD;  Location: [facility];  Service:  Gastroenterology;;   CARDIAC CATHETERIZATION  2011   [facility]   COLONOSCOPY  2003   [place]   ESOPHAGOGASTRODUODENOSCOPY (EGD) WITH PROPOFOL N/A 04/02/2018   Procedure: ESOPHAGOGASTRODUODENOSCOPY (EGD) WITH PROPOFOL;  Surgeon: Irving Copas., MD;  Location: [facility];  Service: Gastroenterology;  Laterality: N/A;   FOREIGN BODY REMOVAL  04/02/2018   Procedure: FOREIGN BODY REMOVAL;  Surgeon: Rush Landmark Telford Nab., MD;  Location: [facility];  Service: Gastroenterology;;   HERNIA REPAIR Right 09/23/2012   repair RIH   LAPAROSCOPIC HYSTERECTOMY     PATELLAR TENDON REPAIR Left 06/03/2019   Procedure: LEFT PATELLA TENDON REPAIR, RIGHT KNEE INJECTION.;  Surgeon: Paralee Cancel, MD;  Location: [facility];  Service: Orthopedics;  Laterality: Left;   PATELLAR TENDON REPAIR Left 07/01/2019   Procedure: OPEN PATELLA TENDON REPAIR;  Surgeon: Paralee Cancel, MD;  Location: [facility];  Service: Orthopedics;  Laterality: Left;   TONSILLECTOMY     TOTAL KNEE ARTHROPLASTY Left 02/11/2019   Procedure: TOTAL KNEE ARTHROPLASTY;  Surgeon: Paralee Cancel, MD;  Location: [facility];  Service: Orthopedics;  Laterality: Left;  70 mins    Family History  Problem Relation Age of Onset   Heart disease Mother    Heart attack Mother    Heart disease Father    Heart attack Father    Breast cancer Sister    Breast cancer Maternal Aunt    Heart disease Brother        sister    Allergies  Allergen Reactions   Codeine Nausea Only    Current Outpatient Medications on File Prior to Visit  Medication Sig Dispense Refill   acetaminophen (TYLENOL) 500 MG tablet Take 2 tablets (1,000 mg total) by mouth every 8 (eight) hours. 30 tablet 0   aspirin EC 81 MG tablet Take 1 tablet (81 mg total) by mouth daily. Swallow whole. Aspirin & plavix for 21 days, f/by aspirin alone 30 tablet 0   Calcium-Magnesium-Vitamin D (CALCIUM 1200+D3 PO) Take 1 tablet by mouth daily.     clopidogrel (PLAVIX) 75 MG tablet Take 1 tablet  (75 mg total) by mouth daily for 21 days. Aspirin & plavix for 21 days, f/by aspirin alone 21 tablet 0   enalapril (VASOTEC) 10 MG tablet Take 1 tablet (10 mg total) by mouth daily. for blood pressure. 90 tablet 1   levothyroxine (SYNTHROID) 88 MCG tablet Take 1 tablet by mouth every morning on an empty stomach with water only.  No food or other medications for 30 minutes. 90 tablet 2   metoprolol tartrate (LOPRESSOR) 25 MG tablet TAKE 1 TABLET BY MOUTH EVERY EVENING 90 tablet 2   Multiple Vitamin (MULTIVITAMIN PO) Take 1 tablet by mouth daily.     simvastatin (ZOCOR) 20 MG tablet Take 1 tablet (20 mg total) by mouth every evening. for cholesterol. 90 tablet 2   traMADol (ULTRAM) 50 MG tablet Take 1 tablet (50 mg total) by mouth daily as needed. For moderate pain. 90 tablet 0   No current facility-administered medications on file prior to visit.    BP 126/82   Pulse 75   Temp 97.9 F (36.6 C) (Temporal)   Ht 5' (1.524 m)   Wt 152 lb (68.9 kg) Comment: patient reported  SpO2 98%   BMI 29.69 kg/m  Objective:   Physical Exam Cardiovascular:     Rate and Rhythm: Normal rate and regular rhythm.  Pulmonary:     Effort: Pulmonary effort is normal.     Breath sounds: Normal breath sounds.  Musculoskeletal:     Cervical back: Neck supple.  Skin:    General: Skin is warm and dry.  Neurological:     Cranial Nerves: No cranial nerve deficit.     Coordination: Coordination normal.           Assessment & Plan:   Problem List Items Addressed This Visit       Cardiovascular and Mediastinum   Hypertension    Controlled.  Continue enalapril 10 mg daily only.       TIA (transient ischemic attack) - Primary    Recent hospitalization. Reviewed ED notes, labs, imaging reviewed.  Symptoms remain resolved. Exam today stable.  Continue Plavix 75 mg daily x 21 days, continue aspirin 81 mg daily. Unclear if she needs needs DAPT lifelong or not as the notes are conflicting between  vascular services and hospitalist.  She will ask neurology during her upcoming visit.   Continue simvastatin.       Other Visit Diagnoses     Overactive bladder       Relevant Medications   oxybutynin (DITROPAN-XL) 5 MG 24 hr tablet          [name], NP

## 2022-06-16 NOTE — Patient Instructions (Signed)
Follow up with neurology as discussed.  Start oxybutynin XL 5 mg at bedtime for overactive bladder.   It was a pleasure to see you today!

## 2022-06-22 NOTE — Discharge Summary (Signed)
Physician Discharge Summary   Patient: Kathleen Paul MRN: [id number] DOB: 15-Aug-1935  Admit date:     06/11/2022  Discharge date: 06/12/2022  Discharge Physician: Max Sane   PCP: [name], NP   Recommendations at discharge:    F/up with outpt providers as requested  Discharge Diagnoses: Principal Problem:   TIA (transient ischemic attack) Active Problems:   Hypertension   Dyslipidemia   CAD (coronary artery disease)   Hypothyroidism   Gastroesophageal reflux disease   Chronic kidney disease, stage 3a (HCC)   Palpitation   Obesity (BMI 30-39.9)   Stroke-like symptoms   Expressive aphasia  Hospital Course: Assessment and Plan: 86 year old female with k/h/o hypertension, hyperlipidemia, hypothyroidism, CAD, mitral valve regurgitation, anemia, remote DVT not on anticoagulation, gastric ulcer, esophageal stricture, palpitation on metoprolol, obesity with BMI 31.25, CKD-3A, admitted for difficulty speaking that started on arousal from sleep.  Her family member was going to ask her for cold medicine, but found her to be confused.  The patient's description of simply not being able to get her words out is concerning for an aphasic episode as opposed to mild delirium associated with arousal from sleep, though I think that mild delirium is also possibility.  She does have sick contacts, and may be coming down with an early cold, but is currently not displaying URI symptoms.    Given the concerning the description, I would favor treating this as TIA.  She does have some vertebral artery stenosis, but I think that this would be asymptomatic.  TIA Aphasia with vertebral stenosis - Per vascular surgery, Patient MRA and CTA look like a more distal lesion the the right vertebral artery and is not atretic and does not look atherosclerotic. The left vertebral artery is clearly the dominant circle of willisand is intact. There is no acute stroke on the MRA of the brain. 1) aspirin 81 mg  daily and Plavix 75 mg daily x 3 weeks followed by aspirin 81 mg daily thereafter (vascular surgery defers on that as they recommend dual antiplatelet therapy lifelong as opposed to any further angiography or intervention.), would let patient have outpt f/up with Neuro, PCP, Vascular surgery to decide final course of antiplatelets 2) continue current lipid control, LDL at goal 3) goal A1c less than seven 4) consider longer-term cardiac monitoring given the possibility of TIA.      Consultants: Neurology, Vascular Surgery Disposition: Home Diet recommendation:  Discharge Diet Orders (From admission, onward)     Start     Ordered   06/12/22 0000  Diet - low sodium heart healthy        06/12/22 1248           Carb modified diet DISCHARGE MEDICATION: Allergies as of 06/12/2022       Reactions   Codeine Nausea Only        Medication List     STOP taking these medications    hydrochlorothiazide 25 MG tablet Commonly known as: HYDRODIURIL       TAKE these medications    acetaminophen 500 MG tablet Commonly known as: TYLENOL Take 2 tablets (1,000 mg total) by mouth every 8 (eight) hours.   aspirin EC 81 MG tablet Take 1 tablet (81 mg total) by mouth daily. Swallow whole. Aspirin & plavix for 21 days, f/by aspirin alone What changed: additional instructions   CALCIUM 1200+D3 PO Take 1 tablet by mouth daily.   clopidogrel 75 MG tablet Commonly known as: Plavix Take 1 tablet (75  mg total) by mouth daily for 21 days. Aspirin & plavix for 21 days, f/by aspirin alone   enalapril 10 MG tablet Commonly known as: Vasotec Take 1 tablet (10 mg total) by mouth daily. for blood pressure.   levothyroxine 88 MCG tablet Commonly known as: SYNTHROID Take 1 tablet by mouth every morning on an empty stomach with water only.  No food or other medications for 30 minutes.   metoprolol tartrate 25 MG tablet Commonly known as: LOPRESSOR TAKE 1 TABLET BY MOUTH EVERY EVENING    MULTIVITAMIN PO Take 1 tablet by mouth daily.   simvastatin 20 MG tablet Commonly known as: ZOCOR Take 1 tablet (20 mg total) by mouth every evening. for cholesterol.   traMADol 50 MG tablet Commonly known as: ULTRAM Take 1 tablet (50 mg total) by mouth daily as needed. For moderate pain.        Follow-up Information     [name], NP. Schedule an appointment as soon as possible for a visit in 3 day(s).   Specialty: Internal Medicine Why: [facility] Discharge F/UP Contact information: [address] [place] [address] [phone]         Anabel Bene, MD. Schedule an appointment as soon as possible for a visit in 2 week(s).   Specialty: Neurology Why: [facility] Discharge F/UP Contact information: [facility] [place] [address] [phone]                Discharge Exam: Danley Danker Weights   06/11/22 1114  Weight: 72.6 kg   General: Not in acute distress HEENT:       Eyes: PERRL, EOMI, no scleral icterus.       ENT: No discharge from the ears and nose, no pharynx injection, no tonsillar enlargement.        Neck: No JVD, no bruit, no mass felt. Heme: No neck lymph node enlargement. Cardiac: S1/S2, irregular rhythm, No murmurs, No gallops or rubs. Respiratory: No rales, wheezing, rhonchi or rubs. GI: Soft, nondistended, nontender, no rebound pain, no organomegaly, BS present. GU: No hematuria Ext: No pitting leg edema bilaterally. 1+DP/PT pulse bilaterally. Musculoskeletal: No joint deformities, No joint redness or warmth, no limitation of ROM in spin. Skin: No rashes.  Has bruise in right hand Neuro: Alert, oriented X3, cranial nerves II-XII grossly intact, moves all extremities normally. Muscle strength 5/5 in all extremities, sensation to light touch intact.  Psych: Patient is not psychotic, no suicidal or hemocidal ideation.  Condition at discharge: good  The results of significant  diagnostics from this hospitalization (including imaging, microbiology, ancillary and laboratory) are listed below for reference.   Imaging Studies: ECHOCARDIOGRAM COMPLETE  Result Date: 06/12/2022    ECHOCARDIOGRAM REPORT   Patient Name:   Kathleen Paul Date of Exam: 06/12/2022 Medical Rec #:  [id number]       Height:       60.0 in Accession #:    [id number]      Weight:       160.0 lb Date of Birth:  11/15/1935        BSA:          1.698 m Patient Age:    39 years        BP:           138/83 mmHg Patient Gender: F               HR:  59 bpm. Exam Location:  [facility] Procedure: 2D Echo, Color Doppler and Cardiac Doppler Indications:     G45.9 TIA  History:         Patient has prior history of Echocardiogram examinations, most                  recent 04/21/2010. Non-obstructive CAD; Risk Factors:HCL.  Sonographer:     Charmayne Sheer Referring Phys:  [place] Diagnosing Phys: Kathlyn Sacramento MD  Sonographer Comments: Technically difficult study due to poor echo windows. IMPRESSIONS  1. Left ventricular ejection fraction, by estimation, is 50 to 55%. The left ventricle has low normal function. Left ventricular endocardial border not optimally defined to evaluate regional wall motion. Left ventricular diastolic parameters are consistent with Grade I diastolic dysfunction (impaired relaxation).  2. Right ventricular systolic function is normal. The right ventricular size is normal.  3. The mitral valve is normal in structure. Trivial mitral valve regurgitation. No evidence of mitral stenosis.  4. The aortic valve is calcified. Aortic valve regurgitation is mild. Aortic valve sclerosis/calcification is present, without any evidence of aortic stenosis.  5. The inferior vena cava is normal in size with greater than 50% respiratory variability, suggesting right atrial pressure of 3 mmHg. FINDINGS  Left Ventricle: Left ventricular ejection fraction, by estimation, is 50 to 55%. The left ventricle has low normal  function. Left ventricular endocardial border not optimally defined to evaluate regional wall motion. The left ventricular internal cavity  size was normal in size. There is no left ventricular hypertrophy. Left ventricular diastolic parameters are consistent with Grade I diastolic dysfunction (impaired relaxation). Right Ventricle: The right ventricular size is normal. No increase in right ventricular wall thickness. Right ventricular systolic function is normal. Left Atrium: Left atrial size was normal in size. Right Atrium: Right atrial size was normal in size. Pericardium: There is no evidence of pericardial effusion. Mitral Valve: The mitral valve is normal in structure. Trivial mitral valve regurgitation. No evidence of mitral valve stenosis. Tricuspid Valve: The tricuspid valve is normal in structure. Tricuspid valve regurgitation is not demonstrated. No evidence of tricuspid stenosis. Aortic Valve: The aortic valve is calcified. Aortic valve regurgitation is mild. Aortic regurgitation PHT measures 781 msec. Aortic valve sclerosis/calcification is present, without any evidence of aortic stenosis. Aortic valve mean gradient measures 4.0  mmHg. Aortic valve peak gradient measures 7.2 mmHg. Aortic valve area, by VTI measures 1.43 cm. Pulmonic Valve: The pulmonic valve was normal in structure. Pulmonic valve regurgitation is not visualized. No evidence of pulmonic stenosis. Aorta: The aortic root is normal in size and structure. Venous: The inferior vena cava is normal in size with greater than 50% respiratory variability, suggesting right atrial pressure of 3 mmHg. IAS/Shunts: No atrial level shunt detected by color flow Doppler.  LEFT VENTRICLE PLAX 2D LVIDd:         3.30 cm   Diastology LVIDs:         2.90 cm   LV e' medial:    6.31 cm/s LV PW:         1.00 cm   LV E/e' medial:  13.3 LV IVS:        0.70 cm   LV e' lateral:   6.85 cm/s LVOT diam:     1.80 cm   LV E/e' lateral: 12.2 LV SV:         42 LV SV  Index:   25 LVOT Area:     2.54 cm  RIGHT  VENTRICLE RV Basal diam:  3.00 cm LEFT ATRIUM         Index LA diam:    2.00 cm 1.18 cm/m  AORTIC VALVE                    PULMONIC VALVE AV Area (Vmax):    1.53 cm     PV Vmax:       0.97 m/s AV Area (Vmean):   1.53 cm     PV Vmean:      66.800 cm/s AV Area (VTI):     1.43 cm     PV VTI:        0.162 m AV Vmax:           134.00 cm/s  PV Peak grad:  3.7 mmHg AV Vmean:          94.400 cm/s  PV Mean grad:  2.0 mmHg AV VTI:            0.295 m AV Peak Grad:      7.2 mmHg AV Mean Grad:      4.0 mmHg LVOT Vmax:         80.60 cm/s LVOT Vmean:        56.700 cm/s LVOT VTI:          0.166 m LVOT/AV VTI ratio: 0.56 AI PHT:            781 msec  AORTA Ao Root diam: 3.40 cm MITRAL VALVE MV Area (PHT): 4.15 cm     SHUNTS MV Decel Time: 183 msec     Systemic VTI:  0.17 m MV E velocity: 83.80 cm/s   Systemic Diam: 1.80 cm MV A velocity: 105.00 cm/s MV E/A ratio:  0.80 Kathlyn Sacramento MD Electronically signed by Kathlyn Sacramento MD Signature Date/Time: 06/12/2022/6:08:09 PM    Final    CT ANGIO HEAD NECK W WO CM  Result Date: 06/11/2022 CLINICAL DATA:  Transient ischemic attack EXAM: CT ANGIOGRAPHY HEAD AND NECK TECHNIQUE: Multidetector CT imaging of the head and neck was performed using the standard protocol during bolus administration of intravenous contrast. Multiplanar CT image reconstructions and MIPs were obtained to evaluate the vascular anatomy. Carotid stenosis measurements (when applicable) are obtained utilizing NASCET criteria, using the distal internal carotid diameter as the denominator. RADIATION DOSE REDUCTION: This exam was performed according to the departmental dose-optimization program which includes automated exposure control, adjustment of the mA and/or kV according to patient size and/or use of iterative reconstruction technique. CONTRAST:  58m OMNIPAQUE IOHEXOL 350 MG/ML SOLN COMPARISON:  None Available. FINDINGS: CTA NECK FINDINGS SKELETON: There is no bony  spinal canal stenosis. No lytic or blastic lesion. OTHER NECK: Normal pharynx, larynx and major salivary glands. No cervical lymphadenopathy. Unremarkable thyroid gland. UPPER CHEST: Hiatal hernia AORTIC ARCH: There is calcific atherosclerosis of the aortic arch. There is no aneurysm, dissection or hemodynamically significant stenosis of the visualized portion of the aorta. Conventional 3 vessel aortic branching pattern. The visualized proximal subclavian arteries are widely patent. RIGHT CAROTID SYSTEM: No dissection, occlusion or aneurysm. Mild atherosclerotic calcification at the carotid bifurcation without hemodynamically significant stenosis. LEFT CAROTID SYSTEM: No dissection, occlusion or aneurysm. Mild atherosclerotic calcification at the carotid bifurcation without hemodynamically significant stenosis. VERTEBRAL ARTERIES: Codominant configuration. The right vertebral artery is severely narrowed at the C1-2 level but remains patent to the skull base. The left vertebral artery is normal. CTA HEAD FINDINGS POSTERIOR CIRCULATION: --Vertebral arteries: Normal V4 segments. --Inferior cerebellar arteries:  Normal. --Basilar artery: Normal. --Superior cerebellar arteries: Normal. --Posterior cerebral arteries (PCA): Normal. ANTERIOR CIRCULATION: --Intracranial internal carotid arteries: Normal. --Anterior cerebral arteries (ACA): Normal. Both A1 segments are present. Patent anterior communicating artery (a-comm). --Middle cerebral arteries (MCA): Normal. VENOUS SINUSES: As permitted by contrast timing, patent. ANATOMIC VARIANTS: Fetal origin of the left posterior cerebral artery. Review of the MIP images confirms the above findings. IMPRESSION: 1. No emergent large vessel occlusion. 2. 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 3. Hiatal hernia. 4. Mild bilateral carotid bifurcation atherosclerosis without hemodynamically significant stenosis. 5. Aortic atherosclerosis (ICD10-I70.0).  Electronically Signed   By: Ulyses Jarred M.D.   On: 06/11/2022 21:23   MR ANGIO HEAD WO CONTRAST  Result Date: 06/11/2022 CLINICAL DATA:  Expressive aphasia beginning last night with some improvement. EXAM: MRA HEAD WITHOUT CONTRAST TECHNIQUE: Angiographic images of the Circle of Willis were acquired using MRA technique without intravenous contrast. COMPARISON:  None Available. FINDINGS: Anterior circulation: Internal carotid arteries are within normal limits from the high cervical segments through the ICA termini. The A1 and M1 segments are normal. The anterior communicating scratched at no definite anterior communicating artery is present. ACA and MCA branch vessels are within normal limits. Posterior circulation: The left vertebral artery is dominant vessel. Abnormal signal is present in the right vertebral artery suggesting slow flow. More normal signal is present in the very distal segment. Vertebrobasilar junction basilar artery are normal. Right posterior cerebral artery originates from basilar tip. Left posterior cerebral artery is of fetal type. PCA branch vessels are within normal limits bilaterally. Anatomic variants: Fetal type left posterior cerebral artery. Other: None. IMPRESSION: 1. Abnormal signal in the right vertebral artery suggesting slow flow. 2. Otherwise normal MRA circle-of-Willis without other significant proximal stenosis, aneurysm, or branch vessel occlusion. These results were called by telephone at the time of interpretation on 06/11/2022 at 7:10 pm to provider Dr. Jori Moll, Who verbally acknowledged these results. Electronically Signed   By: San Morelle M.D.   On: 06/11/2022 19:10   MR BRAIN WO CONTRAST  Result Date: 06/11/2022 CLINICAL DATA:  TIA. Expressive aphasia beginning last evening. Confusion. EXAM: MRI HEAD WITHOUT CONTRAST TECHNIQUE: Multiplanar, multiecho pulse sequences of the brain and surrounding structures were obtained without intravenous contrast.  COMPARISON:  CT head without contrast 06/11/2022 FINDINGS: Brain: No acute infarct, hemorrhage, or mass lesion is present. Mild atrophy is within normal limits for age. No significant white matter lesions are present. The ventricles are of normal size. Deep brain nuclei are within normal limits. No significant extraaxial fluid collection is present. The internal auditory canals are within normal limits. The brainstem and cerebellum are within normal limits. Vascular: Flow is present in the major intracranial arteries. Skull and upper cervical spine: The craniocervical junction is normal. Upper cervical spine is within normal limits. Marrow signal is unremarkable. Sinuses/Orbits: The paranasal sinuses and mastoid air cells are clear. The globes and orbits are within normal limits. IMPRESSION: Normal MRI appearance of the brain for age. No acute or focal lesion to explain expressive aphasia. Electronically Signed   By: San Morelle M.D.   On: 06/11/2022 19:01   US Carotid Bilateral (at [facility] and [facility] only)  Result Date: 06/11/2022 CLINICAL DATA:  Transient ischemic attack EXAM: BILATERAL CAROTID DUPLEX ULTRASOUND TECHNIQUE: Pearline Cables scale imaging, color Doppler and duplex ultrasound were performed of bilateral carotid and vertebral arteries in the neck. COMPARISON:  None Available. FINDINGS: Criteria: Quantification of carotid stenosis is based on velocity parameters  that correlate the residual internal carotid diameter with NASCET-based stenosis levels, using the diameter of the distal internal carotid lumen as the denominator for stenosis measurement. The following velocity measurements were obtained: RIGHT ICA: 84 cm/sec CCA: 616 cm/sec SYSTOLIC ICA/CCA RATIO:  0.8 ECA: 129 cm/sec LEFT ICA: 98 cm/sec CCA: 28 cm/sec SYSTOLIC ICA/CCA RATIO:  0.9 ECA: 104 cm/sec RIGHT CAROTID ARTERY: Scattered calcified and noncalcified plaque. RIGHT VERTEBRAL ARTERY: Antegrade flow in the right vertebral artery. LEFT CAROTID  ARTERY:  Scattered calcified and noncalcified plaque. LEFT VERTEBRAL ARTERY:  Antegrade flow in the left vertebral artery. IMPRESSION: No significant stenosis in the right or left internal carotid arteries. Scattered calcified noncalcified plaque is identified bilaterally. Antegrade flow in the bilateral vertebral arteries. Electronically Signed   By: Dorise Bullion III M.D.   On: 06/11/2022 18:46   CT HEAD WO CONTRAST  Result Date: 06/11/2022 CLINICAL DATA:  I assume it deposited on mental status change with unknown cause EXAM: CT HEAD WITHOUT CONTRAST TECHNIQUE: Contiguous axial images were obtained from the base of the skull through the vertex without intravenous contrast. RADIATION DOSE REDUCTION: This exam was performed according to the departmental dose-optimization program which includes automated exposure control, adjustment of the mA and/or kV according to patient size and/or use of iterative reconstruction technique. COMPARISON:  Head CT 11/09/2014 FINDINGS: Brain: No evidence of acute infarction, hemorrhage, hydrocephalus, extra-axial collection or mass lesion/mass effect. Vascular: No hyperdense vessel or unexpected calcification. Skull: Normal. Negative for fracture or focal lesion. Sinuses/Orbits: No acute finding. IMPRESSION: No acute finding.  Unremarkable for age. Electronically Signed   By: Jorje Guild M.D.   On: 06/11/2022 12:58    Microbiology: Results for orders placed or performed during the hospital encounter of 06/11/22  Resp Panel by RT-PCR (Flu A&B, Covid) Anterior Nasal Swab     Status: None   Collection Time: 06/11/22  4:27 PM   Specimen: Anterior Nasal Swab  Result Value Ref Range Status   SARS Coronavirus 2 by RT PCR NEGATIVE NEGATIVE Final    Comment: (NOTE) SARS-CoV-2 target nucleic acids are NOT DETECTED.  The SARS-CoV-2 RNA is generally detectable in upper respiratory specimens during the acute phase of infection. The lowest concentration of SARS-CoV-2 viral  copies this assay can detect is 138 copies/mL. A negative result does not preclude SARS-Cov-2 infection and should not be used as the sole basis for treatment or other patient management decisions. A negative result may occur with  improper specimen collection/handling, submission of specimen other than nasopharyngeal swab, presence of viral mutation(s) within the areas targeted by this assay, and inadequate number of viral copies(<138 copies/mL). A negative result must be combined with clinical observations, patient history, and epidemiological information. The expected result is Negative.  Fact Sheet for Patients:  EntrepreneurPulse.com.au  Fact Sheet for Healthcare Providers:  IncredibleEmployment.be  This test is no t yet approved or cleared by the [place] FDA and  has been authorized for detection and/or diagnosis of SARS-CoV-2 by FDA under an Emergency Use Authorization (EUA). This EUA will remain  in effect (meaning this test can be used) for the duration of the COVID-19 declaration under Section 564(b)(1) of the Act, 21 U.S.C.section 360bbb-3(b)(1), unless the authorization is terminated  or revoked sooner.       Influenza A by PCR NEGATIVE NEGATIVE Final   Influenza B by PCR NEGATIVE NEGATIVE Final    Comment: (NOTE) The Xpert Xpress SARS-CoV-2/FLU/RSV plus assay is intended as an aid in the diagnosis of influenza  from Nasopharyngeal swab specimens and should not be used as a sole basis for treatment. Nasal washings and aspirates are unacceptable for Xpert Xpress SARS-CoV-2/FLU/RSV testing.  Fact Sheet for Patients: EntrepreneurPulse.com.au  Fact Sheet for Healthcare Providers: IncredibleEmployment.be  This test is not yet approved or cleared by the [place] FDA and has been authorized for detection and/or diagnosis of SARS-CoV-2 by FDA under an Emergency Use Authorization (EUA). This  EUA will remain in effect (meaning this test can be used) for the duration of the COVID-19 declaration under Section 564(b)(1) of the Act, 21 U.S.C. section 360bbb-3(b)(1), unless the authorization is terminated or revoked.  Performed at [facility], [address] [place], [place] [address]      Discharge time spent: greater than 30 minutes.  Signed: Max Sane, MD [facility] 06/22/2022

## 2022-06-27 DIAGNOSIS — I10 Essential (primary) hypertension: Secondary | ICD-10-CM | POA: Diagnosis not present

## 2022-06-27 DIAGNOSIS — E7800 Pure hypercholesterolemia, unspecified: Secondary | ICD-10-CM | POA: Diagnosis not present

## 2022-06-27 DIAGNOSIS — Z8673 Personal history of transient ischemic attack (TIA), and cerebral infarction without residual deficits: Secondary | ICD-10-CM | POA: Diagnosis not present

## 2022-06-27 NOTE — Progress Notes (Signed)
Viral symptoms and ams, needing covid and flu testing.

## 2022-06-30 NOTE — Telephone Encounter (Signed)
DAPT length of therapy is unclear as vascular and neurology had different opinions (neurology rec'd DAPT x 3 weeks then aspirin alone, vascular rec'd DAPT indefinitely). PCP has advised pt to ask neurology at upcoming visit.

## 2022-07-05 NOTE — Addendum Note (Signed)
Encounter addended by: Jeannette How on: 07/05/2022 11:04 AM  Actions taken: Imaging Exam ended

## 2022-07-30 NOTE — Progress Notes (Signed)
Cardiology Office Note   Date:  07/30/2022   ID:  Kathleen Paul, DOB September 14, 1935, MRN [id number]  PCP:  [name], NP  Cardiologist:   Dorris Carnes, MD   F/U of  Palpitatoins, HTN and CAD     History of Present Illness: Kathleen Paul is a 87 y.o. female with a history of mild CAD by cath in 2011 (50% mid LAD)HTN , palpitations, hypotension, esophageal stricture and LE edema  The pt also has dx of RA  I saw the pt in clinic in 2019   She was last seen by Joellyn Rued in 2023    Since seen she denies CP  Breathing is OK  No dizziness     SHe was seen in Dec 2023 with inability to speak   Symptoms resolved   CT and MRA done   No acute stroke   Distal R vertebral  atretic.   Seen by neuro and vascular surgery   Recomm Plavix and ASA   Echo and monitor done   No signifciant abnormalites The pt has had no furhter neurolgic symptoms   No outpatient medications have been marked as taking for the 07/31/22 encounter (Appointment) with Fay Records, MD.     Allergies:   Codeine   Past Medical History:  Diagnosis Date   Anxiety state, unspecified    Arthritis    Atherosclerosis    Cancer ([place])    skin cancer   Diverticulosis    Esophageal stricture    Essential hypertension, benign    Gastric ulcer    Hiatal hernia    History of DVT (deep vein thrombosis) yrs ago   legs   IDA (iron deficiency anemia)    Inguinal hernia without mention of obstruction or gangrene, unilateral or unspecified, (not specified as recurrent)    2014   Internal hemorrhoids    Mild mitral regurgitation    a. 04/2010 Echo: nl EF, mild diast dysfxn, trace PR, mild TR, mild-mod MR.   Non-obstructive CAD    a. 04/2010 Myoview: EF 77%, fixed apical/inferior defect;  b. 04/2010 Cath: LAD 50m LCX/RCA minor irregs, EF 60%;  c. 04/2011 Myoview: low risk.   Pure hypercholesterolemia    Rupture of left patellar tendon    05-26-2019   Scoliosis    UI (urinary incontinence)    Umbilical hernia without  mention of obstruction or gangrene    2014   Unspecified hypothyroidism    Vitamin D deficiency     Past Surgical History:  Procedure Laterality Date   ABDOMINAL HYSTERECTOMY  2008   complete    BIOPSY  04/02/2018   Procedure: BIOPSY;  Surgeon: MIrving Copas, MD;  Location: [facility];  Service: Gastroenterology;;   CARDIAC CATHETERIZATION  2011   [facility]   COLONOSCOPY  2003   [facility] [place]   ESOPHAGOGASTRODUODENOSCOPY (EGD) WITH PROPOFOL N/A 04/02/2018   Procedure: ESOPHAGOGASTRODUODENOSCOPY (EGD) WITH PROPOFOL;  Surgeon: MIrving Copas, MD;  [facility];  Service: Gastroenterology;  Laterality: N/A;   FOREIGN BODY REMOVAL  04/02/2018   Procedure: FOREIGN BODY REMOVAL;  Surgeon: MRush LandmarkGTelford Nab, MD;  Location: [facility];  Service: Gastroenterology;;   HERNIA REPAIR Right 09/23/2012   repair RIH   LAPAROSCOPIC HYSTERECTOMY     PATELLAR TENDON REPAIR Left 06/03/2019   Procedure: LEFT PATELLA TENDON REPAIR, RIGHT KNEE INJECTION.;  Surgeon: OParalee Cancel MD;  Location: [facility];  Service: Orthopedics;  Laterality: Left;   PATELLAR TENDON REPAIR Left 07/01/2019  Procedure: OPEN PATELLA TENDON REPAIR;  Surgeon: Paralee Cancel, MD;  Location: [facility];  Service: Orthopedics;  Laterality: Left;   TONSILLECTOMY     TOTAL KNEE ARTHROPLASTY Left 02/11/2019   Procedure: TOTAL KNEE ARTHROPLASTY;  Surgeon: Paralee Cancel, MD;  Location: [facility];  Service: Orthopedics;  Laterality: Left;  70 mins     Social History:  The patient  reports that she quit smoking about 34 years ago. Her smoking use included cigarettes. She has a 15.00 pack-year smoking history. She has never used smokeless tobacco. She reports that she does not drink alcohol and does not use drugs.   Family History:  The patient's family history includes Breast cancer in her maternal aunt and sister; Heart attack in her father and mother; Heart disease in her brother, father, and mother.    ROS:   Please see the history of present illness. All other systems are reviewed and  Negative to the above problem except as noted.    PHYSICAL EXAM: VS:  There were no vitals taken for this visit.  GEN: Well nourished, well developed, in no acute distress  HEENT: normal  Neck: no JVD, carotid bruits, or masses Cardiac: RRR; no murmurs, rubs, or gallops,no edema  Respiratory:  clear to auscultation bilaterally, normal work of breathing GI: soft, nontender, nondistended, + BS  No hepatomegaly  MS: Jt deformities of RA   Skin: warm and dry, no rash Neuro:  Strength and sensation are intact Psych: euthymic mood, full affect   EKG:  EKG is not ordered today.    Monitor      Predominant rhythm:   Sinus rhythm   Rates 43 to 144 bpm  Average HR 65 bpm. Rare PVCs, frequent PACs, (14.8% total). Diary entries, triggered events correlated with SR with PACs, SR with atrial couplets.    Echo   Dec 2023   1. Left ventricular ejection fraction, by estimation, is 50 to 55%. The  left ventricle has low normal function. Left ventricular endocardial  border not optimally defined to evaluate regional wall motion. Left  ventricular diastolic parameters are  consistent with Grade I diastolic dysfunction (impaired relaxation).   2. Right ventricular systolic function is normal. The right ventricular  size is normal.   3. The mitral valve is normal in structure. Trivial mitral valve  regurgitation. No evidence of mitral stenosis.   4. The aortic valve is calcified. Aortic valve regurgitation is mild.  Aortic valve sclerosis/calcification is present, without any evidence of  aortic stenosis.   5. The inferior vena cava is normal in size with greater than 50%  respiratory variability, suggesting right atrial pressure of 3 mmHg.    Carotid USN   Dec 2023  IMPRESSION: No significant stenosis in the right or left internal carotid arteries. Scattered calcified noncalcified plaque is  identified bilaterally. Antegrade flow in the bilateral vertebral arteries.   Lipid Panel    Component Value Date/Time   CHOL 137 06/12/2022 0500   TRIG 34 06/12/2022 0500   HDL 61 06/12/2022 0500   CHOLHDL 2.2 06/12/2022 0500   VLDL 7 06/12/2022 0500   LDLCALC 69 06/12/2022 0500   LDLDIRECT 180.2 07/17/2013 0828      Wt Readings from Last 3 Encounters:  06/16/22 152 lb (68.9 kg)  06/11/22 160 lb (72.6 kg)  02/15/22 153 lb 12.8 oz (69.8 kg)      ASSESSMENT AND PLAN:  1  Papitations  Pt denies   2  HTN  BP is controlled  3  CAD   Mild at cath in 2011  Pt remains asymptomatic    4  HL   LDL was 69   Continue on simvisatin  5  Hx TIA   Keep on ASA  6   RA  On Plaquenil   Will give short Rx of Klonopin 0.25 mg for nerves( fill eight)  Pt intermitt gets extremely anious Will arrange f/u in [facility]   Will arrange f/u in 61yr  Current medicines are reviewed at length with the patient today.  The patient does not have concerns regarding medicines.  Signed, PDorris Carnes MD  07/30/2022 7:45 PM    [address] [place]  [id number]: [phone] Fax: [phone]  ++

## 2022-07-31 ENCOUNTER — Encounter: Payer: Self-pay | Admitting: Internal Medicine

## 2022-07-31 ENCOUNTER — Ambulatory Visit: Payer: PPO | Attending: Internal Medicine | Admitting: Internal Medicine

## 2022-07-31 VITALS — BP 128/74 | HR 86 | Ht 59.0 in | Wt 145.0 lb

## 2022-07-31 DIAGNOSIS — I10 Essential (primary) hypertension: Secondary | ICD-10-CM

## 2022-07-31 MED ORDER — CLONAZEPAM 0.5 MG PO TABS: ORAL_TABLET | ORAL | 0 refills | Status: DC

## 2022-07-31 MED ORDER — CLONAZEPAM 0.5 MG PO TABS: 0.2500 mg | ORAL_TABLET | Freq: Two times a day (BID) | ORAL | 0 refills | Status: DC | PRN

## 2022-07-31 NOTE — Patient Instructions (Signed)
Medication Instructions:  Klonopin 0.25 mg as needed for nervousness  *If you need a refill on your cardiac medications before your next appointment, please call your pharmacy*   We recommend signing up for the patient portal called "MyChart".  Sign up information is provided on this After Visit Summary.  MyChart is used to connect with patients for Virtual Visits (Telemedicine).  Patients are able to view lab/test results, encounter notes, upcoming appointments, etc.  Non-urgent messages can be sent to your provider as well.   To learn more about what you can do with MyChart, go to [email or web address].    Your next appointment:    Oct 2024   Provider:   Dorris Carnes, MD     Other Instructions

## 2022-08-02 ENCOUNTER — Telehealth: Payer: Self-pay | Admitting: Internal Medicine

## 2022-08-02 NOTE — Telephone Encounter (Signed)
Pt c/o medication issue:  1. Name of Medication: clonazePAM (KLONOPIN) 0.5 MG tablet   2. How are you currently taking this medication (dosage and times per day)?    Take 0.5 tablets (0.25 mg total) by mouth 2 (two) times daily as needed for anxiety.    3. Are you having a reaction (difficulty breathing--STAT)?   4. What is your medication issue? Patient called stating her pharmacy never received this prescription.  She would like it sent to [facility] [place]

## 2022-08-02 NOTE — Telephone Encounter (Signed)
Patient was returning call. Please advise ?

## 2022-08-02 NOTE — Telephone Encounter (Signed)
Left message to call back

## 2022-08-02 NOTE — Telephone Encounter (Signed)
Left message to call back on 08/02/22.

## 2022-08-03 ENCOUNTER — Encounter: Payer: Self-pay | Admitting: Internal Medicine

## 2022-08-03 MED ORDER — CLONAZEPAM 0.5 MG PO TABS: 0.2500 mg | ORAL_TABLET | Freq: Two times a day (BID) | ORAL | 0 refills | Status: DC | PRN

## 2022-08-03 NOTE — Telephone Encounter (Signed)
I spoke with [facility] and the original script did not go through because their Escribe was down at that time... I have now resent it and advised the pt to check with them later today

## 2022-08-04 NOTE — Telephone Encounter (Signed)
I will forward this to [name].    I signed the paper copy Will check with her to see if given.

## 2022-08-07 MED ORDER — CLONAZEPAM 0.5 MG PO TABS: 0.2500 mg | ORAL_TABLET | Freq: Two times a day (BID) | ORAL | 0 refills | Status: DC | PRN

## 2022-08-23 ENCOUNTER — Encounter (INDEPENDENT_AMBULATORY_CARE_PROVIDER_SITE_OTHER): Payer: PPO

## 2022-08-23 DIAGNOSIS — N3281 Overactive bladder: Secondary | ICD-10-CM

## 2022-09-03 MED ORDER — OXYBUTYNIN CHLORIDE ER 10 MG PO TB24: 10.0000 mg | ORAL_TABLET | Freq: Every day | ORAL | 0 refills | Status: DC

## 2022-09-03 NOTE — Telephone Encounter (Signed)

## 2022-09-20 ENCOUNTER — Other Ambulatory Visit: Payer: Self-pay | Admitting: Primary Care

## 2022-09-20 DIAGNOSIS — N3281 Overactive bladder: Secondary | ICD-10-CM

## 2022-10-10 ENCOUNTER — Other Ambulatory Visit: Payer: Self-pay | Admitting: Primary Care

## 2022-10-10 ENCOUNTER — Telehealth: Payer: Self-pay

## 2022-10-10 DIAGNOSIS — M170 Bilateral primary osteoarthritis of knee: Secondary | ICD-10-CM

## 2022-10-10 MED ORDER — TRAMADOL HCL 50 MG PO TABS: 50.0000 mg | ORAL_TABLET | Freq: Every day | ORAL | 0 refills | Status: DC | PRN

## 2022-10-10 NOTE — Progress Notes (Signed)
Care Management & Coordination Services Pharmacy Team  Reason for Encounter: General Adherence Update   Contacted patient for general health update and medication adherence call.   Spoke with family on 10/18/2022   What concerns do you have about your medications? Pain in groin; nausea since taking the antibiotic. Vomited this morning before taking her antibiotics. Pain in groin started last night; Tylenol and Tramadol is not helping; has not been sleeping due to prednisone being in her system.  Pain is located on the right hand side of her groin; wondering if it could be from her hernia site. Right leg and can not lift it; struggling to walk. Pain is the same as last night.   The patient reports the following side effects with their medications.  Please see below  How often do you forget or accidentally miss a dose? Never  Do you use a pillbox? Yes  Are you having any problems getting your medications from your pharmacy? No  Has the cost of your medications been a concern? No  The patient has had an ED visit since last contact.   The patient reports the following problems with their health. Patient has had some nausea before taking the antibiotic and it has gotten worse since taking it yesterday. Patient vomited this morning before taking her antibiotics and reported it is the only time she has vomited. Patient also has pain in her groin and right leg. The pain started last night and has continued through today; pain is located on the right hand side of her groin. Patient's daughter stated due to pain patient can not lift her right leg and she is struggling to walk. Patient has taken Tylenol and Tramadol which are not helping. Patient has not been sleeping due to prednisone being in her system.  Patient's daughter is wondering if the groin pain could be from her hernia site.   Patient denies concerns or questions for Al Corpus, PharmD at this time.   Chart Updates:  Recent office  visits:  08/23/22 [name], NP Message: Change: Oxybutynin XL 10 mg 06/16/22 [name], NP TIA Start: Oxybutynin Chloride 5 mg   Recent consult visits:  07/31/22 Dietrich Pates, MD HTN Start: Clonazepam 0.25mg  F/U 1 year 06/27/22 Theora Master, MD History of TIA No med changes F/U 3-6 months 06/16/22 Laurier Nancy, PA (Sports Medicine) Chronic pain of both shoulders Procedure: Large Joint Injection bilateral subacromial bursa 06/12/22 Echo and Long Term Monitor  Hospital visits:  Al Corpus, PharmD to review  Medications: Outpatient Encounter Medications as of 10/10/2022  Medication Sig   acetaminophen (TYLENOL) 500 MG tablet Take 2 tablets (1,000 mg total) by mouth every 8 (eight) hours.   aspirin EC 81 MG tablet Take 1 tablet (81 mg total) by mouth daily. Swallow whole. Aspirin & plavix for 21 days, f/by aspirin alone   Calcium-Magnesium-Vitamin D (CALCIUM 1200+D3 PO) Take 1 tablet by mouth daily.   clonazePAM (KLONOPIN) 0.5 MG tablet Take 0.5 tablets (0.25 mg total) by mouth 2 (two) times daily as needed for anxiety.   enalapril (VASOTEC) 10 MG tablet Take 1 tablet (10 mg total) by mouth daily. for blood pressure.   levothyroxine (SYNTHROID) 88 MCG tablet Take 1 tablet by mouth every morning on an empty stomach with water only.  No food or other medications for 30 minutes.   metoprolol tartrate (LOPRESSOR) 25 MG tablet TAKE 1 TABLET BY MOUTH EVERY EVENING   Multiple Vitamin (MULTIVITAMIN PO) Take 1 tablet by mouth daily.  oxybutynin (DITROPAN XL) 10 MG 24 hr tablet Take 1 tablet (10 mg total) by mouth at bedtime. For overactive bladder   simvastatin (ZOCOR) 20 MG tablet Take 1 tablet (20 mg total) by mouth every evening. for cholesterol.   traMADol (ULTRAM) 50 MG tablet Take 1 tablet (50 mg total) by mouth daily as needed. For moderate pain.   No facility-administered encounter medications on file as of 10/10/2022.   Recent vitals BP Readings from Last 3 Encounters:   07/31/22 128/74  06/16/22 126/82  06/12/22 (!) 143/63   Pulse Readings from Last 3 Encounters:  07/31/22 86  06/16/22 75  06/12/22 63   Wt Readings from Last 3 Encounters:  07/31/22 145 lb (65.8 kg)  06/16/22 152 lb (68.9 kg)  06/11/22 160 lb (72.6 kg)   BMI Readings from Last 3 Encounters:  07/31/22 29.29 kg/m  06/16/22 29.69 kg/m  06/11/22 31.25 kg/m   Recent lab results    Component Value Date/Time   NA 139 06/11/2022 1117   K 4.3 06/11/2022 1117   CL 104 06/11/2022 1117   CO2 25 06/11/2022 1117   GLUCOSE 113 (H) 06/11/2022 1117   BUN 50 (H) 06/11/2022 1117   CREATININE 1.34 (H) 06/11/2022 1117   CREATININE 1.28 (H) 01/31/2018 0937   CALCIUM 9.4 06/11/2022 1117    Lab Results  Component Value Date   CREATININE 1.34 (H) 06/11/2022   GFR 38.58 (L) 12/13/2021   GFRNONAA 39 (L) 06/11/2022   GFRAA 56 (L) 07/02/2019   Lab Results  Component Value Date/Time   HGBA1C 5.5 06/11/2022 11:17 AM   HGBA1C 5.5 08/18/2019 10:39 AM    Lab Results  Component Value Date   CHOL 137 06/12/2022   HDL 61 06/12/2022   LDLCALC 69 06/12/2022   LDLDIRECT 180.2 07/17/2013   TRIG 34 06/12/2022   CHOLHDL 2.2 06/12/2022   Care Gaps: Annual wellness visit in last year? Yes 12/14/2021  Star Rating Drugs:  Medication:  Last Fill: Day Supply Enalapril 10 mg 08/25/2022 90 Simvastatin 20 mg 08/25/2022 90  Al Corpus, PharmD notified  Claudina Lick, [place] Clinical Pharmacy Assistant [phone] '

## 2022-10-10 NOTE — Telephone Encounter (Signed)
From: [name] To: Office of [name], NP Sent: 10/09/2022 9:10 PM EDT Subject: Medication Renewal Request  Refills have been requested for the following medications:   traMADol (ULTRAM) 50 MG tablet [Sincere Liuzzi K Kenith Trickel]  Preferred pharmacy: [facility] [place], [place] Delivery method: Pickup

## 2022-10-13 ENCOUNTER — Telehealth: Payer: Self-pay | Admitting: Primary Care

## 2022-10-13 ENCOUNTER — Encounter: Payer: Self-pay | Admitting: Primary Care

## 2022-10-13 ENCOUNTER — Telehealth (INDEPENDENT_AMBULATORY_CARE_PROVIDER_SITE_OTHER): Payer: PPO | Admitting: Primary Care

## 2022-10-13 VITALS — Ht 59.0 in | Wt 145.0 lb

## 2022-10-13 DIAGNOSIS — R051 Acute cough: Secondary | ICD-10-CM | POA: Diagnosis not present

## 2022-10-13 MED ORDER — PREDNISONE 20 MG PO TABS: ORAL_TABLET | ORAL | 0 refills | Status: DC

## 2022-10-13 NOTE — Assessment & Plan Note (Signed)
Appears to be allergy vs viral.  She doesn't not appear sickly or in acute distress.  Discussed use of antihistamine. Start prednisone 20 mg tablets. Take 2 tablets by mouth once daily in the morning for 5 days.  Offered Lawyer for which she declines.  I've asked her to call us early next week if symptoms are not improving.

## 2022-10-13 NOTE — Patient Instructions (Signed)
Start prednisone 20 mg tablets. Take 2 tablets by mouth once daily in the morning for 5 days.  Please call me Monday or Tuesday next week if your symptoms are not better.  It was a pleasure to see you today!

## 2022-10-13 NOTE — Progress Notes (Signed)
Patient ID: Kathleen AranJane T Cathell, female    DOB: 07/31/1935, 87 y.o.   MRN: [id number]  Virtual visit completed through [employer], a video enabled telemedicine application. Due to national recommendations of social distancing due to COVID-19, a virtual visit is felt to be most appropriate for this patient at this time. Reviewed limitations, risks, security and privacy concerns of performing a virtual visit and the availability of in person appointments. I also reviewed that there may be a patient responsible charge related to this service. The patient agreed to proceed.   Patient location: home Provider location: [place] [facility] office Persons participating in this virtual visit: patient, provider   If any vitals were documented, they were collected by patient at home unless specified below.    Ht 4\' 11"  (1.499 m)   Wt 145 lb (65.8 kg)   BMI 29.29 kg/m    CC: Acute cough Subjective:   HPI: Kathleen Paul is a 87 y.o. female with a history of CAD, GERD, tobacco use (quit in 1990), CKD, chronic pain presenting on 10/13/2022 for chest congestion (X4 days  Chest congestion, slight cough, hoarse- continually getting worse/No fever, sore throat, sinus pressure, SOB/Has not done covid test)  Symptom onset four days ago with voice hoarseness, chest congestion, and then a cough. She denies chills, body aches, fevers, shortness of breath. Her cough is productive with clear sputum.   She took a home Covid-19 test which was negative. She's taken Coricidin, Cough and Cold and doesn't notice improvement. Her most bothersome symptom is voice hoarseness. She is sleeping well at night overall.      Relevant past medical, surgical, family and social history reviewed and updated as indicated. Interim medical history since our last visit reviewed. Allergies and medications reviewed and updated. Outpatient Medications Prior to Visit  Medication Sig Dispense Refill   acetaminophen (TYLENOL) 500 MG  tablet Take 2 tablets (1,000 mg total) by mouth every 8 (eight) hours. 30 tablet 0   aspirin EC 81 MG tablet Take 1 tablet (81 mg total) by mouth daily. Swallow whole. Aspirin & plavix for 21 days, f/by aspirin alone 30 tablet 0   Calcium-Magnesium-Vitamin D (CALCIUM 1200+D3 PO) Take 1 tablet by mouth daily.     enalapril (VASOTEC) 10 MG tablet Take 1 tablet (10 mg total) by mouth daily. for blood pressure. 90 tablet 1   levothyroxine (SYNTHROID) 88 MCG tablet Take 1 tablet by mouth every morning on an empty stomach with water only.  No food or other medications for 30 minutes. 90 tablet 2   metoprolol tartrate (LOPRESSOR) 25 MG tablet TAKE 1 TABLET BY MOUTH EVERY EVENING 90 tablet 2   Multiple Vitamin (MULTIVITAMIN PO) Take 1 tablet by mouth daily.     oxybutynin (DITROPAN XL) 10 MG 24 hr tablet Take 1 tablet (10 mg total) by mouth at bedtime. For overactive bladder 90 tablet 0   simvastatin (ZOCOR) 20 MG tablet Take 1 tablet (20 mg total) by mouth every evening. for cholesterol. 90 tablet 2   traMADol (ULTRAM) 50 MG tablet Take 1 tablet (50 mg total) by mouth daily as needed. For moderate pain. 90 tablet 0   clonazePAM (KLONOPIN) 0.5 MG tablet Take 0.5 tablets (0.25 mg total) by mouth 2 (two) times daily as needed for anxiety. (Patient not taking: Reported on 10/13/2022) 8 tablet 0   No facility-administered medications prior to visit.     Per HPI unless specifically indicated in ROS section below Review of  Systems  Constitutional:  Negative for chills, fatigue and fever.  HENT:  Positive for congestion and voice change. Negative for postnasal drip and sore throat.   Respiratory:  Positive for cough. Negative for chest tightness and shortness of breath.   Cardiovascular:  Negative for chest pain.   Objective:  Ht 4\' 11"  (1.499 m)   Wt 145 lb (65.8 kg)   BMI 29.29 kg/m   Wt Readings from Last 3 Encounters:  10/13/22 145 lb (65.8 kg)  07/31/22 145 lb (65.8 kg)  06/16/22 152 lb (68.9 kg)        Physical exam: General: Alert and oriented x 3, no distress, does not appear sickly  Pulmonary: Speaks in complete sentences without increased work of breathing, no cough during visit.  Psychiatric: Normal mood, thought content, and behavior.     Results for orders placed or performed during the hospital encounter of 06/11/22  Resp Panel by RT-PCR (Flu A&B, Covid) Anterior Nasal Swab   Specimen: Anterior Nasal Swab  Result Value Ref Range   SARS Coronavirus 2 by RT PCR NEGATIVE NEGATIVE   Influenza A by PCR NEGATIVE NEGATIVE   Influenza B by PCR NEGATIVE NEGATIVE  Protime-INR  Result Value Ref Range   Prothrombin Time 14.9 11.4 - 15.2 seconds   INR 1.2 0.8 - 1.2  APTT  Result Value Ref Range   aPTT 33 24 - 36 seconds  CBC  Result Value Ref Range   WBC 5.4 4.0 - 10.5 K/uL   RBC 3.43 (L) 3.87 - 5.11 MIL/uL   Hemoglobin 10.1 (L) 12.0 - 15.0 g/dL   HCT 10.0 (L) 71.2 - 19.7 %   MCV 92.7 80.0 - 100.0 fL   MCH 29.4 26.0 - 34.0 pg   MCHC 31.8 30.0 - 36.0 g/dL   RDW 58.8 (H) 32.5 - 49.8 %   Platelets 230 150 - 400 K/uL   nRBC 0.0 0.0 - 0.2 %  Differential  Result Value Ref Range   Neutrophils Relative % 60 %   Neutro Abs 3.3 1.7 - 7.7 K/uL   Lymphocytes Relative 17 %   Lymphs Abs 0.9 0.7 - 4.0 K/uL   Monocytes Relative 14 %   Monocytes Absolute 0.8 0.1 - 1.0 K/uL   Eosinophils Relative 1 %   Eosinophils Absolute 0.1 0.0 - 0.5 K/uL   Basophils Relative 0 %   Basophils Absolute 0.0 0.0 - 0.1 K/uL   WBC Morphology MILD LEFT SHIFT (1-5% METAS, OCC MYELO, OCC BANDS)    RBC Morphology MORPHOLOGY UNREMARKABLE    Smear Review Normal platelet morphology    Immature Granulocytes 8 %   Abs Immature Granulocytes 0.43 (H) 0.00 - 0.07 K/uL  Comprehensive metabolic panel  Result Value Ref Range   Sodium 139 135 - 145 mmol/L   Potassium 4.3 3.5 - 5.1 mmol/L   Chloride 104 98 - 111 mmol/L   CO2 25 22 - 32 mmol/L   Glucose, Bld 113 (H) 70 - 99 mg/dL   BUN 50 (H) 8 - 23 mg/dL    Creatinine, Ser 2.64 (H) 0.44 - 1.00 mg/dL   Calcium 9.4 8.9 - 15.8 mg/dL   Total Protein 7.5 6.5 - 8.1 g/dL   Albumin 4.3 3.5 - 5.0 g/dL   AST 21 15 - 41 U/L   ALT 12 0 - 44 U/L   Alkaline Phosphatase 65 38 - 126 U/L   Total Bilirubin 0.8 0.3 - 1.2 mg/dL   GFR, Estimated 39 (L) >60 mL/min  Anion gap 10 5 - 15  Ethanol  Result Value Ref Range   Alcohol, Ethyl (B) <10 <10 mg/dL  Urinalysis, Routine w reflex microscopic Anterior Nasal Swab  Result Value Ref Range   Color, Urine YELLOW (A) YELLOW   APPearance CLEAR (A) CLEAR   Specific Gravity, Urine 1.009 1.005 - 1.030   pH 6.0 5.0 - 8.0   Glucose, UA NEGATIVE NEGATIVE mg/dL   Hgb urine dipstick NEGATIVE NEGATIVE   Bilirubin Urine NEGATIVE NEGATIVE   Ketones, ur NEGATIVE NEGATIVE mg/dL   Protein, ur NEGATIVE NEGATIVE mg/dL   Nitrite NEGATIVE NEGATIVE   Leukocytes,Ua NEGATIVE NEGATIVE  Lipid panel  Result Value Ref Range   Cholesterol 137 0 - 200 mg/dL   Triglycerides 34 <161<150 mg/dL   HDL 61 >09>40 mg/dL   Total CHOL/HDL Ratio 2.2 RATIO   VLDL 7 0 - 40 mg/dL   LDL Cholesterol 69 0 - 99 mg/dL  Hemoglobin U0AA1c  Result Value Ref Range   Hgb A1c MFr Bld 5.5 4.8 - 5.6 %   Mean Plasma Glucose 111 mg/dL  ECHOCARDIOGRAM COMPLETE  Result Value Ref Range   Weight 2,560 oz   Height 60 in   BP 126/94 mmHg   Ao pk vel 1.34 m/s   AV Area VTI 1.43 cm2   AR max vel 1.53 cm2   AV Mean grad 4.0 mmHg   AV Peak grad 7.2 mmHg   S' Lateral 2.90 cm   AV Area mean vel 1.53 cm2   Area-P 1/2 4.15 cm2   P 1/2 time 781 msec   Assessment & Plan:   Problem List Items Addressed This Visit       Other   Acute cough - Primary    Appears to be allergy vs viral.  She doesn't not appear sickly or in acute distress.  Discussed use of antihistamine. Start prednisone 20 mg tablets. Take 2 tablets by mouth once daily in the morning for 5 days.  Offered LawyerTessalon Perles for which she declines.  I've asked her to call us early next week if  symptoms are not improving.       Relevant Medications   predniSONE (DELTASONE) 20 MG tablet     Meds ordered this encounter  Medications   predniSONE (DELTASONE) 20 MG tablet    Sig: Take 2 tablets by mouth once daily in the morning for 5 days.    Dispense:  10 tablet    Refill:  0    Order Specific Question:   Supervising Provider    Answer:   BEDSOLE, AMY E [[id number]]   No orders of the defined types were placed in this encounter.   I discussed the assessment and treatment plan with the patient. The patient was provided an opportunity to ask questions and all were answered. The patient agreed with the plan and demonstrated an understanding of the instructions. The patient was advised to call back or seek an in-person evaluation if the symptoms worsen or if the condition fails to improve as anticipated.  Follow up plan:  Start prednisone 20 mg tablets. Take 2 tablets by mouth once daily in the morning for 5 days.  Please call me Monday or Tuesday next week if your symptoms are not better.  It was a pleasure to see you today!   Doreene NestKatherine K Taylah Dubiel, NP

## 2022-10-13 NOTE — Telephone Encounter (Signed)
Patient called in and stated that she doesn't want to come in the office. Informed patient that Jae Dire would like to see her person. She stated that she is handicap and can't come in, but her daughter is there to help her now. Please advise. Thank you!

## 2022-10-13 NOTE — Telephone Encounter (Signed)
Called patient back and got her on her video appt.

## 2022-10-17 ENCOUNTER — Telehealth: Payer: Self-pay

## 2022-10-17 ENCOUNTER — Telehealth: Payer: PPO | Admitting: Primary Care

## 2022-10-17 DIAGNOSIS — R051 Acute cough: Secondary | ICD-10-CM

## 2022-10-17 MED ORDER — AZITHROMYCIN 250 MG PO TABS: ORAL_TABLET | ORAL | 0 refills | Status: DC

## 2022-10-17 NOTE — Telephone Encounter (Addendum)
Attempted to reach patient. She has virtual visit scheduled for today at 11am. We do not need to do another video visit as she was seen 4 days ago, Jae Dire requested her update [place] via a phone message and we can address/ send medication in if warranted.  Please get update on patient when she calls back in. What symptoms is she experiencing still? Anything new? Any improvement with the Prednisone? Left voicemail for patient to call the office back.

## 2022-10-17 NOTE — Telephone Encounter (Signed)
Called and spoke with patients daughter She states the voice hoarseness cleared up with the prednisone. She still has the cough and is worse now, it is now keeping her up at night.  Every other day she wakes up with a low grade fever, 99.9. She takes Tylenol and then is goes away. She is feeling achy. She did a Covid test this morning, negative. Patients daughter stated she thinking she needs an antibiotic. Please advise

## 2022-10-17 NOTE — Addendum Note (Signed)
Addended by: Doreene Nest on: 10/17/2022 11:06 AM   Modules accepted: Orders

## 2022-10-17 NOTE — Telephone Encounter (Signed)
Noted.  Start Azithromycin antibiotics for infection. Take 2 tablets by mouth today, then 1 tablet daily for 4 additional days.  Will send to pharmacy.

## 2022-10-18 NOTE — Telephone Encounter (Signed)
Patient daughter called in and wanted to speak with one of you in regards to some new symptoms her mom is experiencing.

## 2022-10-18 NOTE — Telephone Encounter (Signed)
Unable to reach patients daughter. Left voicemail to return call to our office.   

## 2022-10-18 NOTE — Telephone Encounter (Signed)
Spoke to patients daughter she states she is having groin pain, nausea/ vomited once, generalized weakness.  Transferred over to triage.

## 2022-10-18 NOTE — Telephone Encounter (Signed)
Noted and agree to ED evaluation. Will await ED notes.

## 2022-10-18 NOTE — Telephone Encounter (Addendum)
I spoke with [name] (DPR signed) who was calling with update on pt condition for today. [name] said that pt could not sleep last night due to right groin pain that was consistently an aching pain; pt could not lay down due to causing more pain in groin. Pt has a hx of hernia in rt groin and [name] has not looked at groin yet and [name] was wondering if the coughing had caused a problem in groin area like another hernia. Pt had H/A last night but no H/A today. Pt has not had any CP,SOB or dizziness. Today pts BP was 110/60 P 105.  Pt was nauseated last night and today and at 7 AM vomited small amount but pt has not been eating or drinking a lot due to nausea.  Pt took ondansetron 4 mg dissolvable tab around 12 :30 pm today and pt is presently asleep. Pt took 2 pills of zithromax on 10/17/22 and took 1 zithromax this morning. Pt started on 10/17/22 with generalized weakness and [name] said today the weakness has worsened; [name] said pt would not be able to walk any distance today because she is so weak. Pt would not be able to walk to car to go anywhere. Pt has lower achy abd pain; [name] is not sure where lower abd pain is actually located.pt had loose stool yesterday but no BM today. Pt has extreme dry mouth per [name]. No fever or cough today. Pt has voided twice today but very small amts. [name] is going to take pt to [name] ED via EMS when pt wakes up to be evaluated and any needed testing or IV fluids if dehydrated. [name] said if any other updates are needed she will call our office. Sending note to [name] and [name] and also verbally updated [name].   [facility] Night - Client Nonclinical Telephone Record  [facility] Client [facility] Night - Client Client Site [facility] - Night Provider [name] - NP Contact Type Call Who Is Calling Patient / Member / Family / Caregiver Caller Name [name] Caller Phone Number  [phone] Patient Name Kathleen Paul Patient DOB 18-Oct-1935 Call Type Message Only Information Provided Reason for Call Request for General Office Information Initial Comment Caller states she just missed a call and is returning the call. Additional Comment Office hours given. Disp. Time Disposition Final User 10/18/2022 1:53:48 PM General Information Provided Yes [name], [name] Call Closed By: Lelon Mast [name] Transaction Date/Time: 10/18/2022 1:50:33 PM (ET

## 2022-10-20 ENCOUNTER — Inpatient Hospital Stay (HOSPITAL_COMMUNITY)
Admission: EM | Admit: 2022-10-20 | Discharge: 2022-10-26 | DRG: 871 | Disposition: A | Discharge status: Skilled Nursing Facility | Payer: PPO | Attending: Student | Admitting: Student

## 2022-10-20 ENCOUNTER — Emergency Department (HOSPITAL_COMMUNITY): Payer: PPO

## 2022-10-20 ENCOUNTER — Other Ambulatory Visit: Payer: Self-pay

## 2022-10-20 ENCOUNTER — Encounter (HOSPITAL_COMMUNITY): Payer: Self-pay

## 2022-10-20 ENCOUNTER — Emergency Department (HOSPITAL_BASED_OUTPATIENT_CLINIC_OR_DEPARTMENT_OTHER): Payer: PPO

## 2022-10-20 DIAGNOSIS — N1831 Chronic kidney disease, stage 3a: Secondary | ICD-10-CM

## 2022-10-20 DIAGNOSIS — N3281 Overactive bladder: Secondary | ICD-10-CM | POA: Diagnosis not present

## 2022-10-20 DIAGNOSIS — I129 Hypertensive chronic kidney disease with stage 1 through stage 4 chronic kidney disease, or unspecified chronic kidney disease: Secondary | ICD-10-CM | POA: Diagnosis present

## 2022-10-20 DIAGNOSIS — J168 Pneumonia due to other specified infectious organisms: Secondary | ICD-10-CM | POA: Diagnosis not present

## 2022-10-20 DIAGNOSIS — I441 Atrioventricular block, second degree: Secondary | ICD-10-CM | POA: Diagnosis not present

## 2022-10-20 DIAGNOSIS — Z90710 Acquired absence of both cervix and uterus: Secondary | ICD-10-CM

## 2022-10-20 DIAGNOSIS — E669 Obesity, unspecified: Secondary | ICD-10-CM | POA: Diagnosis present

## 2022-10-20 DIAGNOSIS — M19012 Primary osteoarthritis, left shoulder: Secondary | ICD-10-CM | POA: Diagnosis not present

## 2022-10-20 DIAGNOSIS — L03116 Cellulitis of left lower limb: Secondary | ICD-10-CM | POA: Diagnosis not present

## 2022-10-20 DIAGNOSIS — E871 Hypo-osmolality and hyponatremia: Secondary | ICD-10-CM | POA: Diagnosis not present

## 2022-10-20 DIAGNOSIS — Z8673 Personal history of transient ischemic attack (TIA), and cerebral infarction without residual deficits: Secondary | ICD-10-CM | POA: Diagnosis not present

## 2022-10-20 DIAGNOSIS — E7800 Pure hypercholesterolemia, unspecified: Secondary | ICD-10-CM | POA: Diagnosis not present

## 2022-10-20 DIAGNOSIS — J189 Pneumonia, unspecified organism: Secondary | ICD-10-CM

## 2022-10-20 DIAGNOSIS — M25512 Pain in left shoulder: Secondary | ICD-10-CM | POA: Diagnosis not present

## 2022-10-20 DIAGNOSIS — G459 Transient cerebral ischemic attack, unspecified: Secondary | ICD-10-CM | POA: Diagnosis present

## 2022-10-20 DIAGNOSIS — K219 Gastro-esophageal reflux disease without esophagitis: Secondary | ICD-10-CM | POA: Diagnosis not present

## 2022-10-20 DIAGNOSIS — R2689 Other abnormalities of gait and mobility: Secondary | ICD-10-CM | POA: Diagnosis not present

## 2022-10-20 DIAGNOSIS — I10 Essential (primary) hypertension: Secondary | ICD-10-CM

## 2022-10-20 DIAGNOSIS — G8929 Other chronic pain: Secondary | ICD-10-CM | POA: Diagnosis not present

## 2022-10-20 DIAGNOSIS — K2100 Gastro-esophageal reflux disease with esophagitis, without bleeding: Secondary | ICD-10-CM | POA: Diagnosis not present

## 2022-10-20 DIAGNOSIS — I6501 Occlusion and stenosis of right vertebral artery: Secondary | ICD-10-CM | POA: Diagnosis not present

## 2022-10-20 DIAGNOSIS — R7989 Other specified abnormal findings of blood chemistry: Secondary | ICD-10-CM | POA: Diagnosis not present

## 2022-10-20 DIAGNOSIS — E039 Hypothyroidism, unspecified: Secondary | ICD-10-CM | POA: Diagnosis not present

## 2022-10-20 DIAGNOSIS — Z803 Family history of malignant neoplasm of breast: Secondary | ICD-10-CM

## 2022-10-20 DIAGNOSIS — Z1152 Encounter for screening for COVID-19: Secondary | ICD-10-CM

## 2022-10-20 DIAGNOSIS — I471 Supraventricular tachycardia, unspecified: Secondary | ICD-10-CM | POA: Diagnosis not present

## 2022-10-20 DIAGNOSIS — M79604 Pain in right leg: Secondary | ICD-10-CM | POA: Diagnosis not present

## 2022-10-20 DIAGNOSIS — Z85828 Personal history of other malignant neoplasm of skin: Secondary | ICD-10-CM

## 2022-10-20 DIAGNOSIS — Z7401 Bed confinement status: Secondary | ICD-10-CM | POA: Diagnosis not present

## 2022-10-20 DIAGNOSIS — A413 Sepsis due to Hemophilus influenzae: Secondary | ICD-10-CM | POA: Diagnosis not present

## 2022-10-20 DIAGNOSIS — Z885 Allergy status to narcotic agent status: Secondary | ICD-10-CM

## 2022-10-20 DIAGNOSIS — M19011 Primary osteoarthritis, right shoulder: Secondary | ICD-10-CM | POA: Diagnosis present

## 2022-10-20 DIAGNOSIS — F29 Unspecified psychosis not due to a substance or known physiological condition: Secondary | ICD-10-CM | POA: Diagnosis not present

## 2022-10-20 DIAGNOSIS — Z8249 Family history of ischemic heart disease and other diseases of the circulatory system: Secondary | ICD-10-CM

## 2022-10-20 DIAGNOSIS — R531 Weakness: Secondary | ICD-10-CM | POA: Diagnosis not present

## 2022-10-20 DIAGNOSIS — M25511 Pain in right shoulder: Secondary | ICD-10-CM | POA: Diagnosis not present

## 2022-10-20 DIAGNOSIS — K4090 Unilateral inguinal hernia, without obstruction or gangrene, not specified as recurrent: Secondary | ICD-10-CM | POA: Diagnosis not present

## 2022-10-20 DIAGNOSIS — Z741 Need for assistance with personal care: Secondary | ICD-10-CM | POA: Diagnosis not present

## 2022-10-20 DIAGNOSIS — R000 Tachycardia, unspecified: Secondary | ICD-10-CM | POA: Diagnosis not present

## 2022-10-20 DIAGNOSIS — R2681 Unsteadiness on feet: Secondary | ICD-10-CM | POA: Diagnosis not present

## 2022-10-20 DIAGNOSIS — Z743 Need for continuous supervision: Secondary | ICD-10-CM | POA: Diagnosis not present

## 2022-10-20 DIAGNOSIS — E876 Hypokalemia: Secondary | ICD-10-CM | POA: Diagnosis not present

## 2022-10-20 DIAGNOSIS — M79605 Pain in left leg: Secondary | ICD-10-CM | POA: Diagnosis not present

## 2022-10-20 DIAGNOSIS — R278 Other lack of coordination: Secondary | ICD-10-CM | POA: Diagnosis not present

## 2022-10-20 DIAGNOSIS — Z86718 Personal history of other venous thrombosis and embolism: Secondary | ICD-10-CM | POA: Diagnosis not present

## 2022-10-20 DIAGNOSIS — K449 Diaphragmatic hernia without obstruction or gangrene: Secondary | ICD-10-CM | POA: Diagnosis not present

## 2022-10-20 DIAGNOSIS — Z8711 Personal history of peptic ulcer disease: Secondary | ICD-10-CM

## 2022-10-20 DIAGNOSIS — K5730 Diverticulosis of large intestine without perforation or abscess without bleeding: Secondary | ICD-10-CM | POA: Diagnosis not present

## 2022-10-20 DIAGNOSIS — Z66 Do not resuscitate: Secondary | ICD-10-CM | POA: Diagnosis present

## 2022-10-20 DIAGNOSIS — M6281 Muscle weakness (generalized): Secondary | ICD-10-CM | POA: Diagnosis not present

## 2022-10-20 DIAGNOSIS — R062 Wheezing: Secondary | ICD-10-CM | POA: Diagnosis not present

## 2022-10-20 DIAGNOSIS — E785 Hyperlipidemia, unspecified: Secondary | ICD-10-CM

## 2022-10-20 DIAGNOSIS — E559 Vitamin D deficiency, unspecified: Secondary | ICD-10-CM | POA: Diagnosis present

## 2022-10-20 DIAGNOSIS — M419 Scoliosis, unspecified: Secondary | ICD-10-CM | POA: Diagnosis present

## 2022-10-20 DIAGNOSIS — Z96652 Presence of left artificial knee joint: Secondary | ICD-10-CM | POA: Diagnosis present

## 2022-10-20 DIAGNOSIS — R918 Other nonspecific abnormal finding of lung field: Secondary | ICD-10-CM | POA: Diagnosis not present

## 2022-10-20 DIAGNOSIS — I2510 Atherosclerotic heart disease of native coronary artery without angina pectoris: Secondary | ICD-10-CM

## 2022-10-20 DIAGNOSIS — A419 Sepsis, unspecified organism: Secondary | ICD-10-CM | POA: Insufficient documentation

## 2022-10-20 DIAGNOSIS — Z79890 Hormone replacement therapy (postmenopausal): Secondary | ICD-10-CM

## 2022-10-20 DIAGNOSIS — M170 Bilateral primary osteoarthritis of knee: Secondary | ICD-10-CM | POA: Diagnosis not present

## 2022-10-20 DIAGNOSIS — K5900 Constipation, unspecified: Secondary | ICD-10-CM | POA: Diagnosis not present

## 2022-10-20 DIAGNOSIS — Z6829 Body mass index (BMI) 29.0-29.9, adult: Secondary | ICD-10-CM

## 2022-10-20 DIAGNOSIS — F411 Generalized anxiety disorder: Secondary | ICD-10-CM | POA: Diagnosis present

## 2022-10-20 DIAGNOSIS — Z79899 Other long term (current) drug therapy: Secondary | ICD-10-CM

## 2022-10-20 DIAGNOSIS — Z7982 Long term (current) use of aspirin: Secondary | ICD-10-CM

## 2022-10-20 DIAGNOSIS — J90 Pleural effusion, not elsewhere classified: Secondary | ICD-10-CM | POA: Diagnosis not present

## 2022-10-20 DIAGNOSIS — R059 Cough, unspecified: Secondary | ICD-10-CM | POA: Diagnosis not present

## 2022-10-20 DIAGNOSIS — M25562 Pain in left knee: Secondary | ICD-10-CM | POA: Diagnosis not present

## 2022-10-20 DIAGNOSIS — R262 Difficulty in walking, not elsewhere classified: Secondary | ICD-10-CM | POA: Diagnosis not present

## 2022-10-20 DIAGNOSIS — L0390 Cellulitis, unspecified: Secondary | ICD-10-CM | POA: Insufficient documentation

## 2022-10-20 DIAGNOSIS — Z87891 Personal history of nicotine dependence: Secondary | ICD-10-CM

## 2022-10-20 HISTORY — DX: Cellulitis, unspecified: L03.90

## 2022-10-20 HISTORY — DX: Pneumonia, unspecified organism: J18.9

## 2022-10-20 LAB — BASIC METABOLIC PANEL
Anion gap: 12 (ref 5–15)
Anion gap: 16 — ABNORMAL HIGH (ref 5–15)
Anion gap: 14 (ref 5–15)
BUN: 61 mg/dL — ABNORMAL HIGH (ref 8–23)
BUN: 46 mg/dL — ABNORMAL HIGH (ref 8–23)
BUN: 50 mg/dL — ABNORMAL HIGH (ref 8–23)
CO2: 22 mmol/L (ref 22–32)
CO2: 17 mmol/L — ABNORMAL LOW (ref 22–32)
CO2: 18 mmol/L — ABNORMAL LOW (ref 22–32)
Calcium: 8.4 mg/dL — ABNORMAL LOW (ref 8.9–10.3)
Calcium: 7.7 mg/dL — ABNORMAL LOW (ref 8.9–10.3)
Calcium: 7.9 mg/dL — ABNORMAL LOW (ref 8.9–10.3)
Chloride: 91 mmol/L — ABNORMAL LOW (ref 98–111)
Chloride: 94 mmol/L — ABNORMAL LOW (ref 98–111)
Chloride: 95 mmol/L — ABNORMAL LOW (ref 98–111)
Creatinine, Ser: 1.31 mg/dL — ABNORMAL HIGH (ref 0.44–1.00)
Creatinine, Ser: 1.17 mg/dL — ABNORMAL HIGH (ref 0.44–1.00)
Creatinine, Ser: 1.14 mg/dL — ABNORMAL HIGH (ref 0.44–1.00)
GFR, Estimated: 40 mL/min — ABNORMAL LOW (ref >60)
GFR, Estimated: 46 mL/min — ABNORMAL LOW (ref >60)
GFR, Estimated: 47 mL/min — ABNORMAL LOW (ref >60)
Glucose, Bld: 64 mg/dL — ABNORMAL LOW (ref 70–99)
Glucose, Bld: 61 mg/dL — ABNORMAL LOW (ref 70–99)
Glucose, Bld: 113 mg/dL — ABNORMAL HIGH (ref 70–99)
Potassium: 4.2 mmol/L (ref 3.5–5.1)
Potassium: 3.9 mmol/L (ref 3.5–5.1)
Potassium: 3.8 mmol/L (ref 3.5–5.1)
Sodium: 125 mmol/L — ABNORMAL LOW (ref 135–145)
Sodium: 127 mmol/L — ABNORMAL LOW (ref 135–145)
Sodium: 127 mmol/L — ABNORMAL LOW (ref 135–145)

## 2022-10-20 LAB — CBC WITH DIFFERENTIAL/PLATELET
Abs Immature Granulocytes: 0.46 10*3/uL — ABNORMAL HIGH (ref 0.00–0.07)
Basophils Absolute: 0.1 10*3/uL (ref 0.0–0.1)
Basophils Relative: 1 %
Eosinophils Absolute: 0.1 10*3/uL (ref 0.0–0.5)
Eosinophils Relative: 0 %
HCT: 30.2 % — ABNORMAL LOW (ref 36.0–46.0)
Hemoglobin: 10.4 g/dL — ABNORMAL LOW (ref 12.0–15.0)
Immature Granulocytes: 2 %
Lymphocytes Relative: 1 %
Lymphs Abs: 0.2 10*3/uL — ABNORMAL LOW (ref 0.7–4.0)
MCH: 30.5 pg (ref 26.0–34.0)
MCHC: 34.4 g/dL (ref 30.0–36.0)
MCV: 88.6 fL (ref 80.0–100.0)
Monocytes Absolute: 0.4 10*3/uL (ref 0.1–1.0)
Monocytes Relative: 2 %
Neutro Abs: 20.1 10*3/uL — ABNORMAL HIGH (ref 1.7–7.7)
Neutrophils Relative %: 94 %
Platelets: 201 10*3/uL (ref 150–400)
RBC: 3.41 MIL/uL — ABNORMAL LOW (ref 3.87–5.11)
RDW: 14.7 % (ref 11.5–15.5)
WBC: 21.4 10*3/uL — ABNORMAL HIGH (ref 4.0–10.5)
nRBC: 0.3 % — ABNORMAL HIGH (ref 0.0–0.2)

## 2022-10-20 LAB — MAGNESIUM: Magnesium: 1.6 mg/dL — ABNORMAL LOW (ref 1.7–2.4)

## 2022-10-20 LAB — TROPONIN I (HIGH SENSITIVITY)
Troponin I (High Sensitivity): 43 ng/L — ABNORMAL HIGH (ref <18)
Troponin I (High Sensitivity): 54 ng/L — ABNORMAL HIGH (ref <18)

## 2022-10-20 LAB — D-DIMER, QUANTITATIVE: D-Dimer, Quant: 8.67 ug/mL-FEU — ABNORMAL HIGH (ref 0.00–0.50)

## 2022-10-20 LAB — SARS CORONAVIRUS 2 BY RT PCR: SARS Coronavirus 2 by RT PCR: NEGATIVE

## 2022-10-20 LAB — LACTIC ACID, PLASMA: Lactic Acid, Venous: 1.6 mmol/L (ref 0.5–1.9)

## 2022-10-20 LAB — PROCALCITONIN: Procalcitonin: 7.98 ng/mL

## 2022-10-20 MED ORDER — MAGNESIUM SULFATE 2 GM/50ML IV SOLN
2.0000 g | Freq: Once | INTRAVENOUS | Status: AC
  Administered 2022-10-20: 2 g via INTRAVENOUS
  Filled 2022-10-20: qty 50

## 2022-10-20 MED ORDER — ASPIRIN 81 MG PO TBEC
81.0000 mg | DELAYED_RELEASE_TABLET | Freq: Every day | ORAL | Status: DC
  Administered 2022-10-21 – 2022-10-26 (×6): 81 mg via ORAL
  Filled 2022-10-20 (×6): qty 1

## 2022-10-20 MED ORDER — SODIUM CHLORIDE 0.9 % IV SOLN
2.0000 g | Freq: Once | INTRAVENOUS | Status: AC
  Administered 2022-10-20: 2 g via INTRAVENOUS
  Filled 2022-10-20: qty 20

## 2022-10-20 MED ORDER — SODIUM CHLORIDE 0.9 % IV BOLUS
1000.0000 mL | Freq: Once | INTRAVENOUS | Status: AC
  Administered 2022-10-20: 1000 mL via INTRAVENOUS

## 2022-10-20 MED ORDER — HYDROCODONE-ACETAMINOPHEN 5-325 MG PO TABS
1.0000 | ORAL_TABLET | Freq: Four times a day (QID) | ORAL | Status: DC | PRN
  Administered 2022-10-21: 1 via ORAL
  Filled 2022-10-20: qty 1

## 2022-10-20 MED ORDER — ENOXAPARIN SODIUM 30 MG/0.3ML IJ SOSY
30.0000 mg | PREFILLED_SYRINGE | INTRAMUSCULAR | Status: DC
  Administered 2022-10-20 – 2022-10-25 (×6): 30 mg via SUBCUTANEOUS
  Filled 2022-10-20 (×6): qty 0.3

## 2022-10-20 MED ORDER — POLYETHYLENE GLYCOL 3350 17 G PO PACK
17.0000 g | PACK | Freq: Every day | ORAL | Status: DC | PRN
  Administered 2022-10-20 – 2022-10-23 (×3): 17 g via ORAL
  Filled 2022-10-20 (×4): qty 1

## 2022-10-20 MED ORDER — LEVOTHYROXINE SODIUM 88 MCG PO TABS
88.0000 ug | ORAL_TABLET | Freq: Every day | ORAL | Status: DC
  Administered 2022-10-21 – 2022-10-26 (×6): 88 ug via ORAL
  Filled 2022-10-20 (×6): qty 1

## 2022-10-20 MED ORDER — ONDANSETRON HCL 4 MG/2ML IJ SOLN
4.0000 mg | Freq: Three times a day (TID) | INTRAMUSCULAR | Status: AC | PRN
  Administered 2022-10-20 – 2022-10-23 (×2): 4 mg via INTRAVENOUS
  Filled 2022-10-20 (×2): qty 2

## 2022-10-20 MED ORDER — SODIUM CHLORIDE 0.9% FLUSH
3.0000 mL | Freq: Two times a day (BID) | INTRAVENOUS | Status: DC
  Administered 2022-10-20 – 2022-10-26 (×12): 3 mL via INTRAVENOUS

## 2022-10-20 MED ORDER — ENALAPRIL MALEATE 10 MG PO TABS
10.0000 mg | ORAL_TABLET | Freq: Every day | ORAL | Status: DC
  Administered 2022-10-21 – 2022-10-26 (×6): 10 mg via ORAL
  Filled 2022-10-20 (×6): qty 1

## 2022-10-20 MED ORDER — ACETAMINOPHEN 325 MG PO TABS
650.0000 mg | ORAL_TABLET | Freq: Four times a day (QID) | ORAL | Status: DC | PRN
  Administered 2022-10-23 – 2022-10-26 (×3): 650 mg via ORAL
  Filled 2022-10-20 (×3): qty 2

## 2022-10-20 MED ORDER — METOPROLOL TARTRATE 25 MG PO TABS: 25.0000 mg | ORAL_TABLET | Freq: Every evening | ORAL | Status: DC

## 2022-10-20 MED ORDER — SODIUM CHLORIDE 0.9 % IV SOLN
100.0000 mg | Freq: Once | INTRAVENOUS | Status: AC
  Administered 2022-10-20: 100 mg via INTRAVENOUS
  Filled 2022-10-20: qty 100

## 2022-10-20 MED ORDER — HYDROMORPHONE HCL 1 MG/ML IJ SOLN
0.5000 mg | Freq: Once | INTRAMUSCULAR | Status: AC
  Administered 2022-10-20: 0.5 mg via INTRAVENOUS
  Filled 2022-10-20: qty 1

## 2022-10-20 MED ORDER — ACETAMINOPHEN 650 MG RE SUPP: 650.0000 mg | Freq: Four times a day (QID) | RECTAL | Status: DC | PRN

## 2022-10-20 MED ORDER — SODIUM CHLORIDE 0.9 % IV SOLN
2.0000 g | INTRAVENOUS | Status: DC
  Administered 2022-10-21 – 2022-10-24 (×4): 2 g via INTRAVENOUS
  Filled 2022-10-20 (×4): qty 20

## 2022-10-20 MED ORDER — IOHEXOL 350 MG/ML SOLN
60.0000 mL | Freq: Once | INTRAVENOUS | Status: AC | PRN
  Administered 2022-10-20: 60 mL via INTRAVENOUS

## 2022-10-20 MED ORDER — HYDROMORPHONE HCL 1 MG/ML IJ SOLN
0.5000 mg | INTRAMUSCULAR | Status: DC | PRN
  Administered 2022-10-20 – 2022-10-23 (×14): 0.5 mg via INTRAVENOUS
  Filled 2022-10-20 (×14): qty 0.5

## 2022-10-20 MED ORDER — METOPROLOL TARTRATE 5 MG/5ML IV SOLN
2.5000 mg | Freq: Once | INTRAVENOUS | Status: AC | PRN
  Administered 2022-10-20: 2.5 mg via INTRAVENOUS

## 2022-10-20 MED ORDER — METOPROLOL TARTRATE 25 MG PO TABS
25.0000 mg | ORAL_TABLET | Freq: Every evening | ORAL | Status: DC
  Administered 2022-10-20 – 2022-10-21 (×2): 25 mg via ORAL
  Filled 2022-10-20 (×2): qty 1

## 2022-10-20 MED ORDER — SIMVASTATIN 20 MG PO TABS
20.0000 mg | ORAL_TABLET | Freq: Every evening | ORAL | Status: DC
  Administered 2022-10-20 – 2022-10-25 (×6): 20 mg via ORAL
  Filled 2022-10-20 (×6): qty 1

## 2022-10-20 MED ORDER — METOPROLOL TARTRATE 5 MG/5ML IV SOLN
2.5000 mg | Freq: Once | INTRAVENOUS | Status: DC
  Administered 2022-10-20: 2.5 mg via INTRAVENOUS
  Filled 2022-10-20: qty 5

## 2022-10-20 NOTE — ED Notes (Addendum)
Pt has multiple complaints at this time including generalized weakness, non-productive cough, right leg and groin pain, and left knee pain. Pt denies painful or burning urination. Denies injury to either leg leg. Pt's left lower leg is red and warm to touch.

## 2022-10-20 NOTE — Progress Notes (Signed)
Lower extremity venous bilateral study completed.  Preliminary results relayed to Alinda Money, MD.  See CV Proc for preliminary results report.   Jean Rosenthal, RDMS, RVT

## 2022-10-20 NOTE — H&P (Addendum)
History and Physical   Kathleen Paul ZOX:[id number] DOB: 21-Aug-1935 DOA: 10/20/2022  PCP: Doreene Nest, NP   Patient coming from: Home  Chief Complaint: General weakness, cough, abdominal pain  HPI: Kathleen Paul is a 87 y.o. female with medical history significant of TIA, CAD, CKD 3A, hypertension, hyperlipidemia, GERD, obesity, hypothyroidism, anxiety, DVT presenting with generalized weakness, cough, abdominal pain.  Patient reports 2 weeks of feeling generally unwell and has had associated nonproductive cough and worsening generalized weakness.  She has reported some pain at her right inguinal fold as well and has history of hernia on the left.  Has been able to pass stool, but last BM was 2 days ago in the setting of decreased p.o. intake..  She did see her PCP for phone visit/virtual visit was prescribed a course of prednisone about a week ago, which she has since completed and then prescribed azithromycin after she failed to improve on the prednisone.  She states that she has had trouble sleeping while she has been on the prednisone has had felt worse in that time period as well.  She additionally reports generalized pain as well as some specifically to her left shoulder and left knee; she reports this is worse after her ambulance ride and she is prone to pain due to some chronic arthritis issues. She she additionally has reported some light sensitivity but no stiffness in the neck nor significant headache; But has had some headache on and off in the setting of decreased p.o. intake.  She developed left lower extremity redness just this morning/overnight per family.  She denies fevers (but reports some readings around 99), chills, chest pain, shortness of breath, constipation, diarrhea, nausea, vomiting.  ED Course: Vital signs in the ED notable for heart rate in the 100s to 130s, blood pressure in the 130s to 160s systolic.  Lab workup included BMP with sodium 125, chloride 91,  BUN 61, creatinine 1.31 which is stable, glucose 64, calcium 8.4.  CBC with hemoglobin stable at 10.4 and leukocytosis to 21.4.  Troponin mildly elevated on first check at 43 with repeat pending.  D-dimer elevated to 8.6.  Lactic acid normal.  COVID screen negative.  Urinalysis and blood cultures pending.  Chest x-ray showing ill-defined left middle lobe/left lower lobe opacity.  CTA PE study was negative for PE but did show left lower lobe changes and opacity consistent with pneumonia.  CTA abdomen pelvis showed diverticulosis without diverticulitis, left inguinal hernia with nonspecific fat stranding, levoscoliosis and osteoarthritis.  Magnesium level checked as well and showed to be low at 1.6.  Patient received Dilaudid, ceftriaxone, doxycycline, 2 L of IV fluids in the ED.  Review of Systems: As per HPI otherwise all other systems reviewed and are negative.  Past Medical History:  Diagnosis Date   Anxiety state, unspecified    Arthritis    Atherosclerosis    Cancer    skin cancer   Diverticulosis    Esophageal stricture    Essential hypertension, benign    Gastric ulcer    Hiatal hernia    History of DVT (deep vein thrombosis) yrs ago   legs   IDA (iron deficiency anemia)    Inguinal hernia without mention of obstruction or gangrene, unilateral or unspecified, (not specified as recurrent)    2014   Internal hemorrhoids    Mild mitral regurgitation    a. 04/2010 Echo: nl EF, mild diast dysfxn, trace PR, mild TR, mild-mod MR.   Non-obstructive CAD  a. 04/2010 Myoview: EF 77%, fixed apical/inferior defect;  b. 04/2010 Cath: LAD 63m, LCX/RCA minor irregs, EF 60%;  c. 04/2011 Myoview: low risk.   Pure hypercholesterolemia    Rupture of left patellar tendon    05-26-2019   Scoliosis    UI (urinary incontinence)    Umbilical hernia without mention of obstruction or gangrene    2014   Unspecified hypothyroidism    Vitamin D deficiency     Past Surgical History:  Procedure  Laterality Date   ABDOMINAL HYSTERECTOMY  2008   complete    BIOPSY  04/02/2018   Procedure: BIOPSY;  Surgeon: Lemar Lofty., MD;  Location: [facility];  Service: Gastroenterology;;   CARDIAC CATHETERIZATION  2011   [facility]   COLONOSCOPY  2003   [facility]   ESOPHAGOGASTRODUODENOSCOPY (EGD) WITH PROPOFOL N/A 04/02/2018   Procedure: ESOPHAGOGASTRODUODENOSCOPY (EGD) WITH PROPOFOL;  Surgeon: Lemar Lofty., MD;  Location: [facility];  Service: Gastroenterology;  Laterality: N/A;   FOREIGN BODY REMOVAL  04/02/2018   Procedure: FOREIGN BODY REMOVAL;  Surgeon: Meridee Score Netty Starring., MD;  Location: [facility];  Service: Gastroenterology;;   HERNIA REPAIR Right 09/23/2012   repair RIH   LAPAROSCOPIC HYSTERECTOMY     PATELLAR TENDON REPAIR Left 06/03/2019   Procedure: LEFT PATELLA TENDON REPAIR, RIGHT KNEE INJECTION.;  Surgeon: Durene Romans, MD;  Location: [facility];  Service: Orthopedics;  Laterality: Left;   PATELLAR TENDON REPAIR Left 07/01/2019   Procedure: OPEN PATELLA TENDON REPAIR;  Surgeon: Durene Romans, MD;  Location: [facility];  Service: Orthopedics;  Laterality: Left;   TONSILLECTOMY     TOTAL KNEE ARTHROPLASTY Left 02/11/2019   Procedure: TOTAL KNEE ARTHROPLASTY;  Surgeon: Durene Romans, MD;  Location: [facility];  Service: Orthopedics;  Laterality: Left;  70 mins    Social History  reports that she quit smoking about 34 years ago. Her smoking use included cigarettes. She has a 15.00 pack-year smoking history. She has never used smokeless tobacco. She reports that she does not drink alcohol and does not use drugs.  Allergies  Allergen Reactions   Codeine Nausea Only    Family History  Problem Relation Age of Onset   Heart disease Mother    Heart attack Mother    Heart disease Father    Heart attack Father    Breast cancer Sister    Breast cancer Maternal Aunt    Heart disease Brother        sister  Reviewed on admission  Prior to Admission medications    Medication Sig Start Date End Date Taking? Authorizing Provider  acetaminophen (TYLENOL) 500 MG tablet Take 2 tablets (1,000 mg total) by mouth every 8 (eight) hours. 07/02/19   Lanney Gins, PA-C  aspirin EC 81 MG tablet Take 1 tablet (81 mg total) by mouth daily. Swallow whole. Aspirin & plavix for 21 days, f/by aspirin alone 06/12/22   Delfino Lovett, MD  azithromycin (ZITHROMAX) 250 MG tablet Take 2 tablets by mouth today, then 1 tablet daily for 4 additional days. 10/17/22   Doreene Nest, NP  Calcium-Magnesium-Vitamin D (CALCIUM 1200+D3 PO) Take 1 tablet by mouth daily.    [provider]  clonazePAM (KLONOPIN) 0.5 MG tablet Take 0.5 tablets (0.25 mg total) by mouth 2 (two) times daily as needed for anxiety. Patient not taking: Reported on 10/13/2022 08/07/22   Pricilla Riffle, MD  enalapril (VASOTEC) 10 MG tablet Take 1 tablet (10 mg total) by mouth daily. for blood pressure. 05/31/22  Doreene Nest, NP  levothyroxine (SYNTHROID) 88 MCG tablet Take 1 tablet by mouth every morning on an empty stomach with water only.  No food or other medications for 30 minutes. 02/15/22   Doreene Nest, NP  metoprolol tartrate (LOPRESSOR) 25 MG tablet TAKE 1 TABLET BY MOUTH EVERY EVENING 02/15/22   Doreene Nest, NP  Multiple Vitamin (MULTIVITAMIN PO) Take 1 tablet by mouth daily.    [provider]  oxybutynin (DITROPAN XL) 10 MG 24 hr tablet Take 1 tablet (10 mg total) by mouth at bedtime. For overactive bladder 09/03/22   Doreene Nest, NP  predniSONE (DELTASONE) 20 MG tablet Take 2 tablets by mouth once daily in the morning for 5 days. 10/13/22   Doreene Nest, NP  simvastatin (ZOCOR) 20 MG tablet Take 1 tablet (20 mg total) by mouth every evening. for cholesterol. 02/15/22   Doreene Nest, NP  traMADol (ULTRAM) 50 MG tablet Take 1 tablet (50 mg total) by mouth daily as needed. For moderate pain. 10/10/22   Doreene Nest, NP    Physical Exam: Vitals:   10/20/22  1011 10/20/22 1234 10/20/22 1235 10/20/22 1300  BP:  134/87    Pulse: (!) 126 (!) 117    Resp: 19 18    Temp:   98.1 F (36.7 C)   TempSrc:   Oral   SpO2: 96% 97%    Height:     (1.499 m)    Physical Exam Constitutional:      General: She is not in acute distress.    Appearance: Normal appearance. She is obese. She is ill-appearing.  HENT:     Head: Normocephalic and atraumatic.     Mouth/Throat:     Mouth: Mucous membranes are moist.     Pharynx: Oropharynx is clear.  Eyes:     Extraocular Movements: Extraocular movements intact.     Pupils: Pupils are equal, round, and reactive to light.  Cardiovascular:     Rate and Rhythm: Regular rhythm. Tachycardia present.     Pulses: Normal pulses.     Heart sounds: Normal heart sounds.  Pulmonary:     Effort: Pulmonary effort is normal. No respiratory distress.     Breath sounds: Rhonchi present.  Abdominal:     General: Bowel sounds are normal. There is no distension.     Palpations: Abdomen is soft.     Tenderness: There is no abdominal tenderness.  Musculoskeletal:        General: No swelling or deformity.     Cervical back: No rigidity.  Skin:    General: Skin is warm and dry.     Comments: Left lower extremity with erythema, warmth.  Neurological:     General: No focal deficit present.     Mental Status: Mental status is at baseline.    Labs on Admission: I have personally reviewed following labs and imaging studies  CBC: Recent Labs  Lab 10/20/22 0927  WBC 21.4*  NEUTROABS 20.1*  HGB 10.4*  HCT 30.2*  MCV 88.6  PLT 201    Basic Metabolic Panel: Recent Labs  Lab 10/20/22 0927  NA 125*  K 4.2  CL 91*  CO2 22  GLUCOSE 64*  BUN 61*  CREATININE 1.31*  CALCIUM 8.4*  MG 1.6*    GFR: Estimated Creatinine Clearance: 26.3 mL/min (A) (by C-G formula based on SCr of 1.31 mg/dL (H)).  Liver Function Tests: No results for input(s): "AST", "ALT", "ALKPHOS", "BILITOT", "  PROT", "ALBUMIN" in the last  168 hours.  Urine analysis:    Component Value Date/Time   COLORURINE YELLOW (A) 06/11/2022 1423   APPEARANCEUR CLEAR (A) 06/11/2022 1423   LABSPEC 1.009 06/11/2022 1423   PHURINE 6.0 06/11/2022 1423   GLUCOSEU NEGATIVE 06/11/2022 1423   HGBUR NEGATIVE 06/11/2022 1423   BILIRUBINUR NEGATIVE 06/11/2022 1423   BILIRUBINUR negative 04/19/2020 0856   KETONESUR NEGATIVE 06/11/2022 1423   PROTEINUR NEGATIVE 06/11/2022 1423   UROBILINOGEN 0.2 04/19/2020 0856   NITRITE NEGATIVE 06/11/2022 1423   LEUKOCYTESUR NEGATIVE 06/11/2022 1423    Radiological Exams on Admission: CT ABDOMEN PELVIS W CONTRAST  Result Date: 10/20/2022 CLINICAL DATA:  Left lung opacity, inguinal hernia with worsening pain. EXAM: CT ANGIOGRAPHY CHEST CT ABDOMEN AND PELVIS WITH CONTRAST TECHNIQUE: Multidetector CT imaging of the chest was performed using the standard protocol during bolus administration of intravenous contrast. Multiplanar CT image reconstructions and MIPs were obtained to evaluate the vascular anatomy. Multidetector CT imaging of the abdomen and pelvis was performed using the standard protocol during bolus administration of intravenous contrast. RADIATION DOSE REDUCTION: This exam was performed according to the departmental dose-optimization program which includes automated exposure control, adjustment of the mA and/or kV according to patient size and/or use of iterative reconstruction technique. CONTRAST:  60mL OMNIPAQUE IOHEXOL 350 MG/ML SOLN COMPARISON:  Chest radiograph performed earlier on the same date. FINDINGS: CTA CHEST FINDINGS Cardiovascular: Satisfactory opacification of the pulmonary arteries to the segmental level. No evidence of pulmonary embolism. Heart is normal in size. Coronary artery atherosclerotic calcifications. Mediastinum/Nodes: No enlarged mediastinal, hilar, or axillary lymph nodes. Thyroid and trachea are within normal limits. Large hiatal hernia with partial intrathoracic stomach.  Lungs/Pleura: Lower lobe opacity with air bronchograms concerning for pneumonia. Right basilar subsegmental linear atelectasis and small right pleural effusion. Musculoskeletal: Thoracic kyphoscoliosis with multilevel degenerate disc disease. No acute chest wall abnormality. Moderate bilateral glenohumeral osteoarthritis. Review of the MIP images confirms the above findings. CT ABDOMEN and PELVIS FINDINGS Hepatobiliary: No focal liver abnormality is seen. No gallstones, gallbladder wall thickening, or biliary dilatation. Pancreas: Unremarkable. No pancreatic ductal dilatation or surrounding inflammatory changes. Spleen: Normal in size without focal abnormality. Adrenals/Urinary Tract: Adrenal glands are unremarkable. Kidneys are normal, without renal calculi, focal lesion, or hydronephrosis. Bladder is unremarkable. Multiple bilateral small renal cortical cysts, likely benign process. Stomach/Bowel: Moderate size hiatal hernia with partially intrathoracic stomach without evidence of obstruction. Small bowel loops are normal in caliber. Appendix not identified, however no inflammatory changes in the pericecal region. Sigmoid colonic diverticulosis without evidence of acute diverticulitis. Vascular/Lymphatic: Moderate aortic atherosclerotic calcification of aorta and branch vessels. No enlarged abdominal or pelvic lymph nodes. Reproductive: Status post hysterectomy. No adnexal masses. Other: Fat containing left inguinal hernia without evidence of obstruction. There is also fat containing umbilical hernia. Nonspecific fat stranding in the left inguinal region subcutaneous soft tissues. Musculoskeletal: Moderate-to-severe levoscoliosis of the lumbar spine centered at L4 vertebral body with partial left lateral subluxation of the L3. 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 Advanced right hip osteoarthritis with near complete loss of the articular  cartilage and subchondral cystic changes. Review of the MIP images confirms the above findings. IMPRESSION: CT angio chest: 1. No evidence of pulmonary embolism. 2. Left lower lobe opacity with air bronchograms concerning for pneumonia. Follow-up examination to resolution is recommended. 3. Large hiatal hernia with partial intrathoracic stomach. CT abdomen/pelvis: 1. Sigmoid colonic diverticulosis without evidence of acute diverticulitis.  2. Fat containing left inguinal hernia without evidence of obstruction. 3. Nonspecific left inguinal region fat stranding without evidence of fluid collection or abscess. Correlate with physical examination findings. 4. Moderate-to-severe levoscoliosis of the lumbar spine centered at L3 vertebral body with partial left lateral subluxation of the L4 vertebral body. 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 5. Advanced right hip osteoarthritis with near complete loss of the articular cartilage and subchondral cystic changes. 6. Aortic Atherosclerosis (ICD10-I70.0). Electronically Signed   By: Larose Hires D.O.   On: 10/20/2022 12:52   CT Angio Chest PE W and/or Wo Contrast  Result Date: 10/20/2022 CLINICAL DATA:  Left lung opacity, inguinal hernia with worsening pain. EXAM: CT ANGIOGRAPHY CHEST CT ABDOMEN AND PELVIS WITH CONTRAST TECHNIQUE: Multidetector CT imaging of the chest was performed using the standard protocol during bolus administration of intravenous contrast. Multiplanar CT image reconstructions and MIPs were obtained to evaluate the vascular anatomy. Multidetector CT imaging of the abdomen and pelvis was performed using the standard protocol during bolus administration of intravenous contrast. RADIATION DOSE REDUCTION: This exam was performed according to the departmental dose-optimization program which includes automated exposure control, adjustment of the mA and/or kV according to patient  size and/or use of iterative reconstruction technique. CONTRAST:  60mL OMNIPAQUE IOHEXOL 350 MG/ML SOLN COMPARISON:  Chest radiograph performed earlier on the same date. FINDINGS: CTA CHEST FINDINGS Cardiovascular: Satisfactory opacification of the pulmonary arteries to the segmental level. No evidence of pulmonary embolism. Heart is normal in size. Coronary artery atherosclerotic calcifications. Mediastinum/Nodes: No enlarged mediastinal, hilar, or axillary lymph nodes. Thyroid and trachea are within normal limits. Large hiatal hernia with partial intrathoracic stomach. Lungs/Pleura: Lower lobe opacity with air bronchograms concerning for pneumonia. Right basilar subsegmental linear atelectasis and small right pleural effusion. Musculoskeletal: Thoracic kyphoscoliosis with multilevel degenerate disc disease. No acute chest wall abnormality. Moderate bilateral glenohumeral osteoarthritis. Review of the MIP images confirms the above findings. CT ABDOMEN and PELVIS FINDINGS Hepatobiliary: No focal liver abnormality is seen. No gallstones, gallbladder wall thickening, or biliary dilatation. Pancreas: Unremarkable. No pancreatic ductal dilatation or surrounding inflammatory changes. Spleen: Normal in size without focal abnormality. Adrenals/Urinary Tract: Adrenal glands are unremarkable. Kidneys are normal, without renal calculi, focal lesion, or hydronephrosis. Bladder is unremarkable. Multiple bilateral small renal cortical cysts, likely benign process. Stomach/Bowel: Moderate size hiatal hernia with partially intrathoracic stomach without evidence of obstruction. Small bowel loops are normal in caliber. Appendix not identified, however no inflammatory changes in the pericecal region. Sigmoid colonic diverticulosis without evidence of acute diverticulitis. Vascular/Lymphatic: Moderate aortic atherosclerotic calcification of aorta and branch vessels. No enlarged abdominal or pelvic lymph nodes. Reproductive: Status  post hysterectomy. No adnexal masses. Other: Fat containing left inguinal hernia without evidence of obstruction. There is also fat containing umbilical hernia. Nonspecific fat stranding in the left inguinal region subcutaneous soft tissues. Musculoskeletal: Moderate-to-severe levoscoliosis of the lumbar spine centered at L4 vertebral body with partial left lateral subluxation of the L3. 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 Advanced right hip osteoarthritis with near complete loss of the articular cartilage and subchondral cystic changes. Review of the MIP images confirms the above findings. IMPRESSION: CT angio chest: 1. No evidence of pulmonary embolism. 2. Left lower lobe opacity with air bronchograms concerning for pneumonia. Follow-up examination to resolution is recommended. 3. Large hiatal hernia with partial intrathoracic stomach. CT abdomen/pelvis: 1. Sigmoid colonic diverticulosis without evidence of  acute diverticulitis. 2. Fat containing left inguinal hernia without evidence of obstruction. 3. Nonspecific left inguinal region fat stranding without evidence of fluid collection or abscess. Correlate with physical examination findings. 4. Moderate-to-severe levoscoliosis of the lumbar spine centered at L3 vertebral body with partial left lateral subluxation of the L4 vertebral body. 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 5. Advanced right hip osteoarthritis with near complete loss of the articular cartilage and subchondral cystic changes. 6. Aortic Atherosclerosis (ICD10-I70.0). Electronically Signed   By: Larose Hires D.O.   On: 10/20/2022 12:52   DG Chest 2 View  Result Date: 10/20/2022 CLINICAL DATA:  Provided history: Weakness. Cough. Pneumonia evaluation. EXAM: CHEST - 2 VIEW COMPARISON:  Chest radiographs 07/05/2021 and earlier. CT  abdomen/pelvis 08/20/2012. FINDINGS: Heart size within normal limits. Aortic atherosclerosis. Large hiatal hernia. Ill-defined opacity within the mid to lower left lung. Small left pleural effusion. No appreciable airspace consolidation on the right. No evidence of pneumothorax. No acute osseous abnormality identified. Degenerative changes of the spine and bilateral glenohumeral joints. Chronic appearing deformity of the metadiaphysis of the proximal right humerus. IMPRESSION: 1. Ill-defined opacity within the mid to lower left lung, which may reflect atelectasis and/or pneumonia. 2. Small left pleural effusion. 3. Large hiatal hernia. 4.  Aortic Atherosclerosis (ICD10-I70.0). Electronically Signed   By: Jackey Loge D.O.   On: 10/20/2022 10:14    EKG: Independently reviewed.  Sinus tachycardia at 109 bpm.  Right bundle blanch block.  Nonspecific T wave changes.  Similar to previous but faster.  Assessment/Plan Principal Problem:   CAP (community acquired pneumonia) Active Problems:   TIA (transient ischemic attack)   Hypertension   Dyslipidemia   CAD (coronary artery disease)   Hypothyroidism   Gastroesophageal reflux disease   Chronic kidney disease, stage 3a   GAD (generalized anxiety disorder)   Cellulitis   Hyponatremia   Pneumonia Cellulitis > Patient presenting with ongoing cough not responding to steroids or azithromycin outpatient as well as generally feeling unwell. > Does have evidence of pneumonia on imaging and leukocytosis (but did complete a course of steroids outpatient) > Has developed skin changes of her right lower extremity that she thought was reaction to prednisone but looks more suspicious for cellulitis on exam. Lower extremity Doppler study has been ordered in the ED to rule out DVT as she has a history of this reports having exertional leg.  CTA negative for PE. > Started on ceftriaxone and doxycycline in the ED. - Monitor on progressive unit given ?Developing  sepsis and hyponatremia as below. - Continue with ceftriaxone and doxycycline - Trend fever curve and WBC - Urine antigens for strep and Legionella - Follow-up urine analysis and blood culture - Procalcitonin  Hyponatremia Hypomagnesemia CKD 3a > Noted to have sodium of 125 in the ED.  Magnesium 1.6.  Creatinine stable at 1.3. > Has received 1+ liters of IV fluids in the ED. - Trend BMP now and every 4 hours, will determine fluid rate based on sodium correction thus far. - 2 g IV magnesium - Trend renal function and electrolytes  Right inguinal pain Generalized pain > Has had some ongoing generalized pain as per HPI.  Additionally reports specific right inguinal pain. > Has been ongoing possibly worsened with cough.  Nothing on imaging to indicate abnormality at the right inguinal area.  Unclear if this could be related to her neurogenic source considering her levo scoliosis noted on CT. - Pain  control with Tylenol for mild pain, Norco for moderate to severe pain, Dilaudid for severe breakthrough pain.  Hypertension - Continue home enalapril, metoprolol  History of TIA > Last year had 3-hour episode of aphasia and diagnosed with TIA.  Noted to have severe vertebral artery stenosis on the right but not intervened upon.  Completed 3 months of Plavix now on aspirin alone. - Continue home aspirin, simvastatin  CAD Hyperlipidemia - Continue home simvastatin  Hypothyroidism - Continue on Synthroid  Obesity - Noted  DVT prophylaxis: Lovenox Code Status:   DNR/DNI, confirmed on admission Family Communication:  Updated at bedside Disposition Plan:   Patient is from:  Home  Anticipated DC to:  Home  Anticipated DC date:  1 to 4 days  Anticipated DC barriers: None  Consults called:  None Admission status:  Observation, progressive  Severity of Illness: The appropriate patient status for this patient is OBSERVATION. Observation status is judged to be reasonable and necessary in  order to provide the required intensity of service to ensure the patient's safety. The patient's presenting symptoms, physical exam findings, and initial radiographic and laboratory data in the context of their medical condition is felt to place them at decreased risk for further clinical deterioration. Furthermore, it is anticipated that the patient will be medically stable for discharge from the hospital within 2 midnights of admission.    Synetta Fail MD [facility]  How to contact the [facility] Attending or Consulting provider 7A - 7P or covering provider during after hours 7P -7A, for this patient?   Check the care team in [facility] and look for a) attending/consulting [facility] provider listed and b) the [facility] team listed Log into [email or web address] and use [place]'s universal password to access. If you do not have the password, please contact the hospital operator. Locate the [facility] [employer] provider you are looking for under [facility] and page to a number that you can be directly reached. If you still have difficulty reaching the provider, please page the [facility] (Director on Call) for the Hospitalists listed on amion for assistance.  10/20/2022, 2:00 PM

## 2022-10-20 NOTE — ED Notes (Signed)
Patient transported to X-ray 

## 2022-10-20 NOTE — ED Notes (Signed)
Unable to obtain labs due to patient and family refusal, MD made aware.

## 2022-10-20 NOTE — Progress Notes (Signed)
Pt arrived from ED complaining of pain and nausea.  Alert and oriented x's 4 with family present.  Tele applied, VSS, CCMD notified.  No significant skin issues noted, although she was in a brief that was saturated.  Purewick initiated.  Episodes of SVT with HR up to 160's.  MD notified of HR and nausea.  PRNs requested.  Will cont to monitor

## 2022-10-20 NOTE — ED Notes (Signed)
ED TO INPATIENT HANDOFF REPORT  ED Nurse Name and Phone #:   S Name/Age/Gender Kathleen Paul 87 y.o. female Room/Bed: 034C/034C  Code Status   Code Status: Full Code  Home/SNF/Other Home Patient oriented to: self, place, time, and situation Is this baseline? Yes   Triage Complete: Triage complete  Chief Complaint CAP (community acquired pneumonia) [J18.9]  Triage Note Pt bib ems from home; c/o generalized weakness x 2 weeks; states thought she had bad allergies , went to MD, prescribed prednisone, since then, increase in mobility issues, unable to sit up; cbg 93, BP 12/72, HR 88, 98% RA; no fever, no N/V; non-productive cough; denies sob; hx arthritis; c/o pain in shoulder and pain to L knee, hx knee replacement   Allergies Allergies  Allergen Reactions   Codeine Nausea Only    Level of Care/Admitting Diagnosis ED Disposition     ED Disposition  Admit   Condition  --   Comment  Hospital Area: [facility] [[id number]]  Level of Care: Progressive [102]  Admit to Progressive based on following criteria: NEPHROLOGY stable condition requiring close monitoring for AKI, requiring Hemodialysis or Peritoneal Dialysis either from expected electrolyte imbalance, acidosis, or fluid overload that can be managed by NIPPV or high flow oxygen.  Admit to Progressive based on following criteria: MULTISYSTEM THREATS such as stable sepsis, metabolic/electrolyte imbalance with or without encephalopathy that is responding to early treatment.  May place patient in observation at [facility] or [facility] if equivalent level of care is available:: No  Covid Evaluation: Asymptomatic - no recent exposure (last 10 days) testing not required  Diagnosis: CAP (community acquired pneumonia) [761950]  Admitting Physician: Synetta Fail [[id number]]  Attending Physician: Synetta Fail [[id number]]          B Medical/Surgery History Past Medical History:  Diagnosis Date    Anxiety state, unspecified    Arthritis    Atherosclerosis    Cancer    skin cancer   Diverticulosis    Esophageal stricture    Essential hypertension, benign    Gastric ulcer    Hiatal hernia    History of DVT (deep vein thrombosis) yrs ago   legs   IDA (iron deficiency anemia)    Inguinal hernia without mention of obstruction or gangrene, unilateral or unspecified, (not specified as recurrent)    2014   Internal hemorrhoids    Mild mitral regurgitation    a. 04/2010 Echo: nl EF, mild diast dysfxn, trace PR, mild TR, mild-mod MR.   Non-obstructive CAD    a. 04/2010 Myoview: EF 77%, fixed apical/inferior defect;  b. 04/2010 Cath: LAD 41m, LCX/RCA minor irregs, EF 60%;  c. 04/2011 Myoview: low risk.   Pure hypercholesterolemia    Rupture of left patellar tendon    05-26-2019   Scoliosis    UI (urinary incontinence)    Umbilical hernia without mention of obstruction or gangrene    2014   Unspecified hypothyroidism    Vitamin D deficiency    Past Surgical History:  Procedure Laterality Date   ABDOMINAL HYSTERECTOMY  2008   complete    BIOPSY  04/02/2018   Procedure: BIOPSY;  Surgeon: Lemar Lofty., MD;  Location: [facility];  Service: Gastroenterology;;   CARDIAC CATHETERIZATION  2011   [facility]   COLONOSCOPY  2003   [facility]   ESOPHAGOGASTRODUODENOSCOPY (EGD) WITH PROPOFOL N/A 04/02/2018   Procedure: ESOPHAGOGASTRODUODENOSCOPY (EGD) WITH PROPOFOL;  Surgeon: Lemar Lofty., MD;  Location: [facility];  Service: Gastroenterology;  Laterality: N/A;   FOREIGN BODY REMOVAL  04/02/2018   Procedure: FOREIGN BODY REMOVAL;  Surgeon: Meridee Score Netty Starring., MD;  Location: [facility];  Service: Gastroenterology;;   HERNIA REPAIR Right 09/23/2012   repair RIH   LAPAROSCOPIC HYSTERECTOMY     PATELLAR TENDON REPAIR Left 06/03/2019   Procedure: LEFT PATELLA TENDON REPAIR, RIGHT KNEE INJECTION.;  Surgeon: Durene Romans, MD;  Location: [facility];  Service:  Orthopedics;  Laterality: Left;   PATELLAR TENDON REPAIR Left 07/01/2019   Procedure: OPEN PATELLA TENDON REPAIR;  Surgeon: Durene Romans, MD;  Location: [facility];  Service: Orthopedics;  Laterality: Left;   TONSILLECTOMY     TOTAL KNEE ARTHROPLASTY Left 02/11/2019   Procedure: TOTAL KNEE ARTHROPLASTY;  Surgeon: Durene Romans, MD;  Location: [facility];  Service: Orthopedics;  Laterality: Left;  70 mins     A IV Location/Drains/Wounds Patient Lines/Drains/Airways Status     Active Line/Drains/Airways     Name Placement date Placement time Site Days   Peripheral IV 10/20/22 20 G Left Antecubital 10/20/22  0929  Antecubital  less than 1   Incision (Closed) 07/01/19 Knee Left 07/01/19  1502  -- 1207            Intake/Output Last 24 hours No intake or output data in the 24 hours ending 10/20/22 1406  Labs/Imaging Results for orders placed or performed during the hospital encounter of 10/20/22 (from the past 48 hour(s))  SARS Coronavirus 2 by RT PCR (hospital order, performed in [facility] hospital lab) *cepheid single result test* Anterior Nasal Swab     Status: None   Collection Time: 10/20/22  9:21 AM   Specimen: Anterior Nasal Swab  Result Value Ref Range   SARS Coronavirus 2 by RT PCR NEGATIVE NEGATIVE    Comment: Performed at [facility] Lab, [address], [place], [place] [address]  Basic metabolic panel     Status: Abnormal   Collection Time: 10/20/22  9:27 AM  Result Value Ref Range   Sodium 125 (L) 135 - 145 mmol/L   Potassium 4.2 3.5 - 5.1 mmol/L   Chloride 91 (L) 98 - 111 mmol/L   CO2 22 22 - 32 mmol/L   Glucose, Bld 64 (L) 70 - 99 mg/dL    Comment: Glucose reference range applies only to samples taken after fasting for at least 8 hours.   BUN 61 (H) 8 - 23 mg/dL   Creatinine, Ser 6.04 (H) 0.44 - 1.00 mg/dL   Calcium 8.4 (L) 8.9 - 10.3 mg/dL   GFR, Estimated 40 (L) >60 mL/min    Comment: (NOTE) Calculated using the CKD-EPI Creatinine Equation (2021)    Anion  gap 12 5 - 15    Comment: Performed at [facility] Lab, [address] [place], [place] [address]  CBC with Differential     Status: Abnormal   Collection Time: 10/20/22  9:27 AM  Result Value Ref Range   WBC 21.4 (H) 4.0 - 10.5 K/uL   RBC 3.41 (L) 3.87 - 5.11 MIL/uL   Hemoglobin 10.4 (L) 12.0 - 15.0 g/dL   HCT 11.9 (L) 14.7 - 82.9 %   MCV 88.6 80.0 - 100.0 fL   MCH 30.5 26.0 - 34.0 pg   MCHC 34.4 30.0 - 36.0 g/dL   RDW 56.2 13.0 - 86.5 %   Platelets 201 150 - 400 K/uL   nRBC 0.3 (H) 0.0 - 0.2 %   Neutrophils Relative % 94 %  Neutro Abs 20.1 (H) 1.7 - 7.7 K/uL   Lymphocytes Relative 1 %   Lymphs Abs 0.2 (L) 0.7 - 4.0 K/uL   Monocytes Relative 2 %   Monocytes Absolute 0.4 0.1 - 1.0 K/uL   Eosinophils Relative 0 %   Eosinophils Absolute 0.1 0.0 - 0.5 K/uL   Basophils Relative 1 %   Basophils Absolute 0.1 0.0 - 0.1 K/uL   Immature Granulocytes 2 %   Abs Immature Granulocytes 0.46 (H) 0.00 - 0.07 K/uL    Comment: Performed at [facility], [address], [place], [place] [address]  Troponin I (High Sensitivity)     Status: Abnormal   Collection Time: 10/20/22  9:27 AM  Result Value Ref Range   Troponin I (High Sensitivity) 43 (H) <18 ng/L    Comment: (NOTE) Elevated high sensitivity troponin I (hsTnI) values and significant  changes across serial measurements may suggest ACS but many other  chronic and acute conditions are known to elevate hsTnI results.  Refer to the "Links" section for chest pain algorithms and additional  guidance. Performed at [facility], [address], [place], [place] [address]   Magnesium     Status: Abnormal   Collection Time: 10/20/22  9:27 AM  Result Value Ref Range   Magnesium 1.6 (L) 1.7 - 2.4 mg/dL    Comment: Performed at [facility], [address], [place], [place] [address]  D-dimer, quantitative     Status: Abnormal   Collection Time: 10/20/22  9:27 AM  Result Value Ref Range   D-Dimer, Quant 8.67 (H) 0.00 -  0.50 ug/mL-FEU    Comment: (NOTE) At the manufacturer cut-off value of 0.5 g/mL FEU, this assay has a negative predictive value of 95-100%.This assay is intended for use in conjunction with a clinical pretest probability (PTP) assessment model to exclude pulmonary embolism (PE) and deep venous thrombosis (DVT) in outpatients suspected of PE or DVT. Results should be correlated with clinical presentation. Performed at [facility], [address], [place], [place] [address]   Lactic acid, plasma     Status: None   Collection Time: 10/20/22 10:45 AM  Result Value Ref Range   Lactic Acid, Venous 1.6 0.5 - 1.9 mmol/L    Comment: Performed at [facility] Lab, [address], [place], [place] [address]   CT ABDOMEN PELVIS W CONTRAST  Result Date: 10/20/2022 CLINICAL DATA:  Left lung opacity, inguinal hernia with worsening pain. EXAM: CT ANGIOGRAPHY CHEST CT ABDOMEN AND PELVIS WITH CONTRAST TECHNIQUE: Multidetector CT imaging of the chest was performed using the standard protocol during bolus administration of intravenous contrast. Multiplanar CT image reconstructions and MIPs were obtained to evaluate the vascular anatomy. Multidetector CT imaging of the abdomen and pelvis was performed using the standard protocol during bolus administration of intravenous contrast. RADIATION DOSE REDUCTION: This exam was performed according to the departmental dose-optimization program which includes automated exposure control, adjustment of the mA and/or kV according to patient size and/or use of iterative reconstruction technique. CONTRAST:  60mL OMNIPAQUE IOHEXOL 350 MG/ML SOLN COMPARISON:  Chest radiograph performed earlier on the same date. FINDINGS: CTA CHEST FINDINGS Cardiovascular: Satisfactory opacification of the pulmonary arteries to the segmental level. No evidence of pulmonary embolism. Heart is normal in size. Coronary artery atherosclerotic calcifications. Mediastinum/Nodes: No enlarged  mediastinal, hilar, or axillary lymph nodes. Thyroid and trachea are within normal limits. Large hiatal hernia with partial intrathoracic stomach. Lungs/Pleura: Lower lobe opacity with  air bronchograms concerning for pneumonia. Right basilar subsegmental linear atelectasis and small right pleural effusion. Musculoskeletal: Thoracic kyphoscoliosis with multilevel degenerate disc disease. No acute chest wall abnormality. Moderate bilateral glenohumeral osteoarthritis. Review of the MIP images confirms the above findings. CT ABDOMEN and PELVIS FINDINGS Hepatobiliary: No focal liver abnormality is seen. No gallstones, gallbladder wall thickening, or biliary dilatation. Pancreas: Unremarkable. No pancreatic ductal dilatation or surrounding inflammatory changes. Spleen: Normal in size without focal abnormality. Adrenals/Urinary Tract: Adrenal glands are unremarkable. Kidneys are normal, without renal calculi, focal lesion, or hydronephrosis. Bladder is unremarkable. Multiple bilateral small renal cortical cysts, likely benign process. Stomach/Bowel: Moderate size hiatal hernia with partially intrathoracic stomach without evidence of obstruction. Small bowel loops are normal in caliber. Appendix not identified, however no inflammatory changes in the pericecal region. Sigmoid colonic diverticulosis without evidence of acute diverticulitis. Vascular/Lymphatic: Moderate aortic atherosclerotic calcification of aorta and branch vessels. No enlarged abdominal or pelvic lymph nodes. Reproductive: Status post hysterectomy. No adnexal masses. Other: Fat containing left inguinal hernia without evidence of obstruction. There is also fat containing umbilical hernia. Nonspecific fat stranding in the left inguinal region subcutaneous soft tissues. Musculoskeletal: Moderate-to-severe levoscoliosis of the lumbar spine centered at L4 vertebral body with partial left lateral subluxation of the L3. Advanced multilevel degenerate disc disease  of the lumbar spine with associated multilevel facet joint arthropathy. Advanced right hip osteoarthritis with near complete loss of the articular cartilage and subchondral cystic changes. Review of the MIP images confirms the above findings. IMPRESSION: CT angio chest: 1. No evidence of pulmonary embolism. 2. Left lower lobe opacity with air bronchograms concerning for pneumonia. Follow-up examination to resolution is recommended. 3. Large hiatal hernia with partial intrathoracic stomach. CT abdomen/pelvis: 1. Sigmoid colonic diverticulosis without evidence of acute diverticulitis. 2. Fat containing left inguinal hernia without evidence of obstruction. 3. Nonspecific left inguinal region fat stranding without evidence of fluid collection or abscess. Correlate with physical examination findings. 4. Moderate-to-severe levoscoliosis of the lumbar spine centered at L3 vertebral body with partial left lateral subluxation of the L4 vertebral body. 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 5. Advanced right hip osteoarthritis with near complete loss of the articular cartilage and subchondral cystic changes. 6. Aortic Atherosclerosis (ICD10-I70.0). Electronically Signed   By: Larose Hires D.O.   On: 10/20/2022 12:52   CT Angio Chest PE W and/or Wo Contrast  Result Date: 10/20/2022 CLINICAL DATA:  Left lung opacity, inguinal hernia with worsening pain. EXAM: CT ANGIOGRAPHY CHEST CT ABDOMEN AND PELVIS WITH CONTRAST TECHNIQUE: Multidetector CT imaging of the chest was performed using the standard protocol during bolus administration of intravenous contrast. Multiplanar CT image reconstructions and MIPs were obtained to evaluate the vascular anatomy. Multidetector CT imaging of the abdomen and pelvis was performed using the standard protocol during bolus administration of intravenous contrast. RADIATION DOSE REDUCTION: This exam was performed  according to the departmental dose-optimization program which includes automated exposure control, adjustment of the mA and/or kV according to patient size and/or use of iterative reconstruction technique. CONTRAST:  60mL OMNIPAQUE IOHEXOL 350 MG/ML SOLN COMPARISON:  Chest radiograph performed earlier on the same date. FINDINGS: CTA CHEST FINDINGS Cardiovascular: Satisfactory opacification of the pulmonary arteries to the segmental level. No evidence of pulmonary embolism. Heart is normal in size. Coronary artery atherosclerotic calcifications. Mediastinum/Nodes: No enlarged mediastinal, hilar, or axillary lymph nodes. Thyroid and trachea are within normal limits. Large hiatal hernia with partial intrathoracic stomach. Lungs/Pleura: Lower lobe  opacity with air bronchograms concerning for pneumonia. Right basilar subsegmental linear atelectasis and small right pleural effusion. Musculoskeletal: Thoracic kyphoscoliosis with multilevel degenerate disc disease. No acute chest wall abnormality. Moderate bilateral glenohumeral osteoarthritis. Review of the MIP images confirms the above findings. CT ABDOMEN and PELVIS FINDINGS Hepatobiliary: No focal liver abnormality is seen. No gallstones, gallbladder wall thickening, or biliary dilatation. Pancreas: Unremarkable. No pancreatic ductal dilatation or surrounding inflammatory changes. Spleen: Normal in size without focal abnormality. Adrenals/Urinary Tract: Adrenal glands are unremarkable. Kidneys are normal, without renal calculi, focal lesion, or hydronephrosis. Bladder is unremarkable. Multiple bilateral small renal cortical cysts, likely benign process. Stomach/Bowel: Moderate size hiatal hernia with partially intrathoracic stomach without evidence of obstruction. Small bowel loops are normal in caliber. Appendix not identified, however no inflammatory changes in the pericecal region. Sigmoid colonic diverticulosis without evidence of acute diverticulitis.  Vascular/Lymphatic: Moderate aortic atherosclerotic calcification of aorta and branch vessels. No enlarged abdominal or pelvic lymph nodes. Reproductive: Status post hysterectomy. No adnexal masses. Other: Fat containing left inguinal hernia without evidence of obstruction. There is also fat containing umbilical hernia. Nonspecific fat stranding in the left inguinal region subcutaneous soft tissues. Musculoskeletal: Moderate-to-severe levoscoliosis of the lumbar spine centered at L4 vertebral body with partial left lateral subluxation of the L3. 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 Advanced right hip osteoarthritis with near complete loss of the articular cartilage and subchondral cystic changes. Review of the MIP images confirms the above findings. IMPRESSION: CT angio chest: 1. No evidence of pulmonary embolism. 2. Left lower lobe opacity with air bronchograms concerning for pneumonia. Follow-up examination to resolution is recommended. 3. Large hiatal hernia with partial intrathoracic stomach. CT abdomen/pelvis: 1. Sigmoid colonic diverticulosis without evidence of acute diverticulitis. 2. Fat containing left inguinal hernia without evidence of obstruction. 3. Nonspecific left inguinal region fat stranding without evidence of fluid collection or abscess. Correlate with physical examination findings. 4. Moderate-to-severe levoscoliosis of the lumbar spine centered at L3 vertebral body with partial left lateral subluxation of the L4 vertebral body. 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 5. Advanced right hip osteoarthritis with near complete loss of the articular cartilage and subchondral cystic changes. 6. Aortic Atherosclerosis (ICD10-I70.0). Electronically Signed   By: Larose Hires D.O.   On: 10/20/2022 12:52   DG Chest 2 View  Result Date:  10/20/2022 CLINICAL DATA:  Provided history: Weakness. Cough. Pneumonia evaluation. EXAM: CHEST - 2 VIEW COMPARISON:  Chest radiographs 07/05/2021 and earlier. CT abdomen/pelvis 08/20/2012. FINDINGS: Heart size within normal limits. Aortic atherosclerosis. Large hiatal hernia. Ill-defined opacity within the mid to lower left lung. Small left pleural effusion. No appreciable airspace consolidation on the right. No evidence of pneumothorax. No acute osseous abnormality identified. Degenerative changes of the spine and bilateral glenohumeral joints. Chronic appearing deformity of the metadiaphysis of the proximal right humerus. IMPRESSION: 1. Ill-defined opacity within the mid to lower left lung, which may reflect atelectasis and/or pneumonia. 2. Small left pleural effusion. 3. Large hiatal hernia. 4.  Aortic Atherosclerosis (ICD10-I70.0). Electronically Signed   By: Jackey Loge D.O.   On: 10/20/2022 10:14    Pending Labs Unresulted Labs (From admission, onward)     Start     Ordered   10/27/22 0500  Creatinine, serum  (enoxaparin (LOVENOX)    CrCl < 30 ml/min)  Once,   R       Comments: while on enoxaparin therapy.  10/20/22 1358   10/21/22 0500  Magnesium  Tomorrow morning,   R        10/20/22 1358   10/21/22 0500  CBC  Tomorrow morning,   R        10/20/22 1358   10/20/22 1357  Basic metabolic panel  Now then every 4 hours,   R (with TIMED occurrences)      10/20/22 1358   10/20/22 1354  Legionella Pneumophila Serogp 1 Ur Ag  (COPD / Pneumonia / Cellulitis / Lower Extremity Wound)  Once,   R        10/20/22 1358   10/20/22 1354  Strep pneumoniae urinary antigen  (COPD / Pneumonia / Cellulitis / Lower Extremity Wound)  Once,   R        10/20/22 1358   10/20/22 1130  Urinalysis, Routine w reflex microscopic -  Once,   R        10/20/22 1130   10/20/22 1020  Blood culture (routine x 2)  BLOOD CULTURE X 2,   R (with STAT occurrences)      10/20/22 1020            Vitals/Pain Today's  Vitals   10/20/22 1011 10/20/22 1234 10/20/22 1235 10/20/22 1300  BP:  134/87    Pulse: (!) 126 (!) 117    Resp: 19 18    Temp:   98.1 F (36.7 C)   TempSrc:   Oral   SpO2: 96% 97%    Height:    4\' 11"  (1.499 m)  PainSc:        Isolation Precautions No active isolations  Medications Medications  aspirin EC tablet 81 mg (has no administration in time range)  simvastatin (ZOCOR) tablet 20 mg (has no administration in time range)  metoprolol tartrate (LOPRESSOR) tablet 25 mg (has no administration in time range)  enalapril (VASOTEC) tablet 10 mg (has no administration in time range)  levothyroxine (SYNTHROID) tablet 88 mcg (has no administration in time range)  cefTRIAXone (ROCEPHIN) 2 g in sodium chloride 0.9 % 100 mL IVPB (has no administration in time range)  enoxaparin (LOVENOX) injection 30 mg (has no administration in time range)  sodium chloride flush (NS) 0.9 % injection 3 mL (has no administration in time range)  magnesium sulfate IVPB 2 g 50 mL (has no administration in time range)  acetaminophen (TYLENOL) tablet 650 mg (has no administration in time range)    Or  acetaminophen (TYLENOL) suppository 650 mg (has no administration in time range)  polyethylene glycol (MIRALAX / GLYCOLAX) packet 17 g (has no administration in time range)  HYDROcodone-acetaminophen (NORCO/VICODIN) 5-325 MG per tablet 1 tablet (has no administration in time range)  HYDROmorphone (DILAUDID) injection 0.5 mg (has no administration in time range)  sodium chloride 0.9 % bolus 1,000 mL (0 mLs Intravenous Stopped 10/20/22 1224)  cefTRIAXone (ROCEPHIN) 2 g in sodium chloride 0.9 % 100 mL IVPB (0 g Intravenous Stopped 10/20/22 1157)  doxycycline (VIBRAMYCIN) 100 mg in sodium chloride 0.9 % 250 mL IVPB (100 mg Intravenous New Bag/Given 10/20/22 1156)  HYDROmorphone (DILAUDID) injection 0.5 mg (0.5 mg Intravenous Given 10/20/22 1051)  iohexol (OMNIPAQUE) 350 MG/ML injection 60 mL (60 mLs Intravenous Contrast  Given 10/20/22 1213)  HYDROmorphone (DILAUDID) injection 0.5 mg (0.5 mg Intravenous Given 10/20/22 1339)  sodium chloride 0.9 % bolus 1,000 mL (1,000 mLs Intravenous New Bag/Given 10/20/22 1341)    Mobility non-ambulatory     Focused Assessments    R Recommendations:  See Admitting Provider Note  Report given to:   Additional Notes:

## 2022-10-20 NOTE — ED Notes (Signed)
Patient is currently in echo.

## 2022-10-20 NOTE — ED Provider Notes (Signed)
[place] EMERGENCY DEPARTMENT AT [facility] Provider Note   CSN: [id number] Arrival date & time: 10/20/22  5784     History  Chief Complaint  Patient presents with   Weakness    Kathleen Paul is a 87 y.o. female presented to ED with generalized weakness.  Patient ports feeling unwell for about 2 weeks.  She reports he has a nonproductive cough.  She also has throbbing pain in her right inguinal fold, and a history of an inguinal hernia.  She reports nausea.  She is able to pass stool.  She saw her MD who prescribed her prednisone which she has taken, thinking that she has "bad allergies" and also reports that she took "a course of antibiotics" although it is not clear which ones.  He continues to have a nonproductive cough.  She reports light sensitivity, but denies headache or nuchal rigidity.  She reports pain in her left shoulder and her left knee, and a history of a left knee replacement.  She also noted redness in her left lower leg, which she feels is a "reaction to prednisone".  She says she has been too weak to get up and walk at home, her mouth feels very dry.  HPI     Home Medications Prior to Admission medications   Medication Sig Start Date End Date Taking? Authorizing Provider  acetaminophen (TYLENOL) 500 MG tablet Take 2 tablets (1,000 mg total) by mouth every 8 (eight) hours. 07/02/19   Lanney Gins, PA-C  aspirin EC 81 MG tablet Take 1 tablet (81 mg total) by mouth daily. Swallow whole. Aspirin & plavix for 21 days, f/by aspirin alone 06/12/22   Delfino Lovett, MD  azithromycin (ZITHROMAX) 250 MG tablet Take 2 tablets by mouth today, then 1 tablet daily for 4 additional days. 10/17/22   Doreene Nest, NP  Calcium-Magnesium-Vitamin D (CALCIUM 1200+D3 PO) Take 1 tablet by mouth daily.    [provider]  clonazePAM (KLONOPIN) 0.5 MG tablet Take 0.5 tablets (0.25 mg total) by mouth 2 (two) times daily as needed for anxiety. Patient not taking:  Reported on 10/13/2022 08/07/22   Pricilla Riffle, MD  enalapril (VASOTEC) 10 MG tablet Take 1 tablet (10 mg total) by mouth daily. for blood pressure. 05/31/22   Doreene Nest, NP  levothyroxine (SYNTHROID) 88 MCG tablet Take 1 tablet by mouth every morning on an empty stomach with water only.  No food or other medications for 30 minutes. 02/15/22   Doreene Nest, NP  metoprolol tartrate (LOPRESSOR) 25 MG tablet TAKE 1 TABLET BY MOUTH EVERY EVENING 02/15/22   Doreene Nest, NP  Multiple Vitamin (MULTIVITAMIN PO) Take 1 tablet by mouth daily.    [provider]  oxybutynin (DITROPAN XL) 10 MG 24 hr tablet Take 1 tablet (10 mg total) by mouth at bedtime. For overactive bladder 09/03/22   Doreene Nest, NP  predniSONE (DELTASONE) 20 MG tablet Take 2 tablets by mouth once daily in the morning for 5 days. 10/13/22   Doreene Nest, NP  simvastatin (ZOCOR) 20 MG tablet Take 1 tablet (20 mg total) by mouth every evening. for cholesterol. 02/15/22   Doreene Nest, NP  traMADol (ULTRAM) 50 MG tablet Take 1 tablet (50 mg total) by mouth daily as needed. For moderate pain. 10/10/22   Doreene Nest, NP      Allergies    Codeine    Review of Systems   Review of Systems  Physical Exam Updated Vital Signs BP (!) 161/89 (BP Location: Right Arm)   Pulse (!) 108   Temp 97.8 F (36.6 C) (Oral)   Resp 14   Ht 4\' 11"  (1.499 m)   SpO2 97%   BMI 29.29 kg/m  Physical Exam Constitutional:      General: She is not in acute distress. HENT:     Head: Normocephalic and atraumatic.  Eyes:     Conjunctiva/sclera: Conjunctivae normal.     Pupils: Pupils are equal, round, and reactive to light.  Cardiovascular:     Rate and Rhythm: Regular rhythm. Tachycardia present.  Pulmonary:     Effort: Pulmonary effort is normal. No respiratory distress.  Abdominal:     General: There is no distension.     Tenderness: There is no abdominal tenderness.     Comments: Mild tenderness of the  left inguinal fold without palpable bulge, or lymphadenopathy, no visible yeast or reddening under the pannus or lesions of the skin  Musculoskeletal:     Cervical back: Normal range of motion and neck supple. No rigidity.  Skin:    General: Skin is warm and dry.     Comments: Warmth redness of the left lower extremity from the knee extending to the ankle  Neurological:     General: No focal deficit present.     Mental Status: She is alert and oriented to person, place, and time. Mental status is at baseline.  Psychiatric:        Mood and Affect: Mood normal.        Behavior: Behavior normal.     ED Results / Procedures / Treatments   Labs (all labs ordered are listed, but only abnormal results are displayed) Labs Reviewed  BASIC METABOLIC PANEL - Abnormal; Notable for the following components:      Result Value   Sodium 125 (*)    Chloride 91 (*)    Glucose, Bld 64 (*)    BUN 61 (*)    Creatinine, Ser 1.31 (*)    Calcium 8.4 (*)    GFR, Estimated 40 (*)    All other components within normal limits  CBC WITH DIFFERENTIAL/PLATELET - Abnormal; Notable for the following components:   WBC 21.4 (*)    RBC 3.41 (*)    Hemoglobin 10.4 (*)    HCT 30.2 (*)    nRBC 0.3 (*)    Neutro Abs 20.1 (*)    Lymphs Abs 0.2 (*)    Abs Immature Granulocytes 0.46 (*)    All other components within normal limits  MAGNESIUM - Abnormal; Notable for the following components:   Magnesium 1.6 (*)    All other components within normal limits  D-DIMER, QUANTITATIVE - Abnormal; Notable for the following components:   D-Dimer, Quant 8.67 (*)    All other components within normal limits  TROPONIN I (HIGH SENSITIVITY) - Abnormal; Notable for the following components:   Troponin I (High Sensitivity) 43 (*)    All other components within normal limits  SARS CORONAVIRUS 2 BY RT PCR  CULTURE, BLOOD (ROUTINE X 2)  CULTURE, BLOOD (ROUTINE X 2)  LACTIC ACID, PLASMA  URINALYSIS, ROUTINE W REFLEX MICROSCOPIC   LEGIONELLA PNEUMOPHILA SEROGP 1 UR AG  STREP PNEUMONIAE URINARY ANTIGEN  BASIC METABOLIC PANEL  BASIC METABOLIC PANEL  BASIC METABOLIC PANEL  PROCALCITONIN  MAGNESIUM  CBC  PROCALCITONIN  BASIC METABOLIC PANEL  BASIC METABOLIC PANEL  TROPONIN I (HIGH SENSITIVITY)    EKG EKG Interpretation  Date/Time:  Friday October 20 2022 09:16:54 EDT Ventricular Rate:  109 PR Interval:  184 QRS Duration: 140 QT Interval:  346 QTC Calculation: 466 R Axis:   -9 Text Interpretation: Sinus tachycardia Right bundle branch block Abnormal inferior Q waves Confirmed by Alvester Chou [phone]) on 10/20/2022 9:19:42 AM  Radiology VAS [place] LOWER EXTREMITY VENOUS (DVT) (ONLY [facility] & [facility])  Result Date: 10/20/2022  Lower Venous DVT Study Patient Name:  Kathleen Paul  Date of Exam:   10/20/2022 Medical Rec #: [id number]        Accession #:    [id number] Date of Birth: 14-Nov-1935         Patient Gender: F Patient Age:   39 years Exam Location:  [facility] Procedure:      VAS [place] LOWER EXTREMITY VENOUS (DVT) Referring Phys: Isabellamarie Randa --------------------------------------------------------------------------------  Indications: Pain in bilateral lower extremities- right groin, left knee, new erythema of left lower extremity x1 day. Other Indications: Remote history of DVT. Limitations: Poor ultrasound/tissue interface and patient pain/sensitivity to compressions. Comparison Study: No prior studies available. Performing Technologist: Jean Rosenthal RDMS, RVT  Examination Guidelines: A complete evaluation includes B-mode imaging, spectral Doppler, color Doppler, and power Doppler as needed of all accessible portions of each vessel. Bilateral testing is considered an integral part of a complete examination. Limited examinations for reoccurring indications may be performed as noted. The reflux portion of the exam is performed with the patient in reverse Trendelenburg.   +---------+---------------+---------+-----------+----------+-------------------+ RIGHT    CompressibilityPhasicitySpontaneityPropertiesThrombus Aging      +---------+---------------+---------+-----------+----------+-------------------+ CFV      Full           Yes      Yes                                      +---------+---------------+---------+-----------+----------+-------------------+ SFJ      Full                                                             +---------+---------------+---------+-----------+----------+-------------------+ FV Prox  Full                                                             +---------+---------------+---------+-----------+----------+-------------------+ FV Mid   Full           Yes      Yes                                      +---------+---------------+---------+-----------+----------+-------------------+ FV DistalFull           Yes      Yes                                      +---------+---------------+---------+-----------+----------+-------------------+ PFV      Full                                                             +---------+---------------+---------+-----------+----------+-------------------+  POP      Full           Yes      Yes                                      +---------+---------------+---------+-----------+----------+-------------------+ PTV      Full                                                             +---------+---------------+---------+-----------+----------+-------------------+ PERO                                                  Not well visualized +---------+---------------+---------+-----------+----------+-------------------+   +---------+---------------+---------+-----------+---------------+--------------+ LEFT     CompressibilityPhasicitySpontaneityProperties     Thrombus Aging +---------+---------------+---------+-----------+---------------+--------------+  CFV      Full           Yes      Yes                                      +---------+---------------+---------+-----------+---------------+--------------+ SFJ      Full                                                             +---------+---------------+---------+-----------+---------------+--------------+ FV Prox  Full                                                             +---------+---------------+---------+-----------+---------------+--------------+ FV Mid                  Yes      Yes        Patent by color               +---------+---------------+---------+-----------+---------------+--------------+ FV Distal               Yes      Yes        Patent by color               +---------+---------------+---------+-----------+---------------+--------------+ PFV      Full                                                             +---------+---------------+---------+-----------+---------------+--------------+ POP      Full           Yes      Yes                                      +---------+---------------+---------+-----------+---------------+--------------+  PTV      Full                                                             +---------+---------------+---------+-----------+---------------+--------------+ PERO     Full                                              Some segments                                                             not well                                                                  visualized     +---------+---------------+---------+-----------+---------------+--------------+     Summary: RIGHT: - There is no evidence of deep vein thrombosis in the lower extremity. However, portions of this examination were limited- see technologist comments above.  - No cystic structure found in the popliteal fossa.  LEFT: - There is no evidence of deep vein thrombosis in the lower extremity. However, portions of  this examination were limited- see technologist comments above.  - No cystic structure found in the popliteal fossa.  *See table(s) above for measurements and observations.    Preliminary    CT ABDOMEN PELVIS W CONTRAST  Result Date: 10/20/2022 CLINICAL DATA:  Left lung opacity, inguinal hernia with worsening pain. EXAM: CT ANGIOGRAPHY CHEST CT ABDOMEN AND PELVIS WITH CONTRAST TECHNIQUE: Multidetector CT imaging of the chest was performed using the standard protocol during bolus administration of intravenous contrast. Multiplanar CT image reconstructions and MIPs were obtained to evaluate the vascular anatomy. Multidetector CT imaging of the abdomen and pelvis was performed using the standard protocol during bolus administration of intravenous contrast. RADIATION DOSE REDUCTION: This exam was performed according to the departmental dose-optimization program which includes automated exposure control, adjustment of the mA and/or kV according to patient size and/or use of iterative reconstruction technique. CONTRAST:  60mL OMNIPAQUE IOHEXOL 350 MG/ML SOLN COMPARISON:  Chest radiograph performed earlier on the same date. FINDINGS: CTA CHEST FINDINGS Cardiovascular: Satisfactory opacification of the pulmonary arteries to the segmental level. No evidence of pulmonary embolism. Heart is normal in size. Coronary artery atherosclerotic calcifications. Mediastinum/Nodes: No enlarged mediastinal, hilar, or axillary lymph nodes. Thyroid and trachea are within normal limits. Large hiatal hernia with partial intrathoracic stomach. Lungs/Pleura: Lower lobe opacity with air bronchograms concerning for pneumonia. Right basilar subsegmental linear atelectasis and small right pleural effusion. Musculoskeletal: Thoracic kyphoscoliosis with multilevel degenerate disc disease. No acute chest wall abnormality. Moderate bilateral glenohumeral osteoarthritis. Review of the MIP images confirms the above findings. CT ABDOMEN and PELVIS  FINDINGS Hepatobiliary: No focal liver abnormality is seen. No gallstones, gallbladder wall thickening, or biliary dilatation. Pancreas:  Unremarkable. No pancreatic ductal dilatation or surrounding inflammatory changes. Spleen: Normal in size without focal abnormality. Adrenals/Urinary Tract: Adrenal glands are unremarkable. Kidneys are normal, without renal calculi, focal lesion, or hydronephrosis. Bladder is unremarkable. Multiple bilateral small renal cortical cysts, likely benign process. Stomach/Bowel: Moderate size hiatal hernia with partially intrathoracic stomach without evidence of obstruction. Small bowel loops are normal in caliber. Appendix not identified, however no inflammatory changes in the pericecal region. Sigmoid colonic diverticulosis without evidence of acute diverticulitis. Vascular/Lymphatic: Moderate aortic atherosclerotic calcification of aorta and branch vessels. No enlarged abdominal or pelvic lymph nodes. Reproductive: Status post hysterectomy. No adnexal masses. Other: Fat containing left inguinal hernia without evidence of obstruction. There is also fat containing umbilical hernia. Nonspecific fat stranding in the left inguinal region subcutaneous soft tissues. Musculoskeletal: Moderate-to-severe levoscoliosis of the lumbar spine centered at L4 vertebral body with partial left lateral subluxation of the L3. 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 Advanced right hip osteoarthritis with near complete loss of the articular cartilage and subchondral cystic changes. Review of the MIP images confirms the above findings. IMPRESSION: CT angio chest: 1. No evidence of pulmonary embolism. 2. Left lower lobe opacity with air bronchograms concerning for pneumonia. Follow-up examination to resolution is recommended. 3. Large hiatal hernia with partial intrathoracic stomach. CT abdomen/pelvis: 1. Sigmoid colonic  diverticulosis without evidence of acute diverticulitis. 2. Fat containing left inguinal hernia without evidence of obstruction. 3. Nonspecific left inguinal region fat stranding without evidence of fluid collection or abscess. Correlate with physical examination findings. 4. Moderate-to-severe levoscoliosis of the lumbar spine centered at L3 vertebral body with partial left lateral subluxation of the L4 vertebral body. 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 5. Advanced right hip osteoarthritis with near complete loss of the articular cartilage and subchondral cystic changes. 6. Aortic Atherosclerosis (ICD10-I70.0). Electronically Signed   By: Larose Hires D.O.   On: 10/20/2022 12:52   CT Angio Chest PE W and/or Wo Contrast  Result Date: 10/20/2022 CLINICAL DATA:  Left lung opacity, inguinal hernia with worsening pain. EXAM: CT ANGIOGRAPHY CHEST CT ABDOMEN AND PELVIS WITH CONTRAST TECHNIQUE: Multidetector CT imaging of the chest was performed using the standard protocol during bolus administration of intravenous contrast. Multiplanar CT image reconstructions and MIPs were obtained to evaluate the vascular anatomy. Multidetector CT imaging of the abdomen and pelvis was performed using the standard protocol during bolus administration of intravenous contrast. RADIATION DOSE REDUCTION: This exam was performed according to the departmental dose-optimization program which includes automated exposure control, adjustment of the mA and/or kV according to patient size and/or use of iterative reconstruction technique. CONTRAST:  60mL OMNIPAQUE IOHEXOL 350 MG/ML SOLN COMPARISON:  Chest radiograph performed earlier on the same date. FINDINGS: CTA CHEST FINDINGS Cardiovascular: Satisfactory opacification of the pulmonary arteries to the segmental level. No evidence of pulmonary embolism. Heart is normal in size. Coronary artery atherosclerotic  calcifications. Mediastinum/Nodes: No enlarged mediastinal, hilar, or axillary lymph nodes. Thyroid and trachea are within normal limits. Large hiatal hernia with partial intrathoracic stomach. Lungs/Pleura: Lower lobe opacity with air bronchograms concerning for pneumonia. Right basilar subsegmental linear atelectasis and small right pleural effusion. Musculoskeletal: Thoracic kyphoscoliosis with multilevel degenerate disc disease. No acute chest wall abnormality. Moderate bilateral glenohumeral osteoarthritis. Review of the MIP images confirms the above findings. CT ABDOMEN and PELVIS FINDINGS Hepatobiliary: No focal liver abnormality is seen. No gallstones, gallbladder wall thickening, or biliary  dilatation. Pancreas: Unremarkable. No pancreatic ductal dilatation or surrounding inflammatory changes. Spleen: Normal in size without focal abnormality. Adrenals/Urinary Tract: Adrenal glands are unremarkable. Kidneys are normal, without renal calculi, focal lesion, or hydronephrosis. Bladder is unremarkable. Multiple bilateral small renal cortical cysts, likely benign process. Stomach/Bowel: Moderate size hiatal hernia with partially intrathoracic stomach without evidence of obstruction. Small bowel loops are normal in caliber. Appendix not identified, however no inflammatory changes in the pericecal region. Sigmoid colonic diverticulosis without evidence of acute diverticulitis. Vascular/Lymphatic: Moderate aortic atherosclerotic calcification of aorta and branch vessels. No enlarged abdominal or pelvic lymph nodes. Reproductive: Status post hysterectomy. No adnexal masses. Other: Fat containing left inguinal hernia without evidence of obstruction. There is also fat containing umbilical hernia. Nonspecific fat stranding in the left inguinal region subcutaneous soft tissues. Musculoskeletal: Moderate-to-severe levoscoliosis of the lumbar spine centered at L4 vertebral body with partial left lateral subluxation of the  L3. 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 Advanced right hip osteoarthritis with near complete loss of the articular cartilage and subchondral cystic changes. Review of the MIP images confirms the above findings. IMPRESSION: CT angio chest: 1. No evidence of pulmonary embolism. 2. Left lower lobe opacity with air bronchograms concerning for pneumonia. Follow-up examination to resolution is recommended. 3. Large hiatal hernia with partial intrathoracic stomach. CT abdomen/pelvis: 1. Sigmoid colonic diverticulosis without evidence of acute diverticulitis. 2. Fat containing left inguinal hernia without evidence of obstruction. 3. Nonspecific left inguinal region fat stranding without evidence of fluid collection or abscess. Correlate with physical examination findings. 4. Moderate-to-severe levoscoliosis of the lumbar spine centered at L3 vertebral body with partial left lateral subluxation of the L4 vertebral body. 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 5. Advanced right hip osteoarthritis with near complete loss of the articular cartilage and subchondral cystic changes. 6. Aortic Atherosclerosis (ICD10-I70.0). Electronically Signed   By: Larose Hires D.O.   On: 10/20/2022 12:52   DG Chest 2 View  Result Date: 10/20/2022 CLINICAL DATA:  Provided history: Weakness. Cough. Pneumonia evaluation. EXAM: CHEST - 2 VIEW COMPARISON:  Chest radiographs 07/05/2021 and earlier. CT abdomen/pelvis 08/20/2012. FINDINGS: Heart size within normal limits. Aortic atherosclerosis. Large hiatal hernia. Ill-defined opacity within the mid to lower left lung. Small left pleural effusion. No appreciable airspace consolidation on the right. No evidence of pneumothorax. No acute osseous abnormality identified. Degenerative changes of the spine and bilateral glenohumeral joints. Chronic  appearing deformity of the metadiaphysis of the proximal right humerus. IMPRESSION: 1. Ill-defined opacity within the mid to lower left lung, which may reflect atelectasis and/or pneumonia. 2. Small left pleural effusion. 3. Large hiatal hernia. 4.  Aortic Atherosclerosis (ICD10-I70.0). Electronically Signed   By: Jackey Loge D.O.   On: 10/20/2022 10:14    Procedures Procedures    Medications Ordered in ED Medications  aspirin EC tablet 81 mg (has no administration in time range)  simvastatin (ZOCOR) tablet 20 mg (20 mg Oral Not Given 10/20/22 1732)  enalapril (VASOTEC) tablet 10 mg (has no administration in time range)  levothyroxine (SYNTHROID) tablet 88 mcg (has no administration in time range)  cefTRIAXone (ROCEPHIN) 2 g in sodium chloride 0.9 % 100 mL IVPB (has no administration in time range)  enoxaparin (LOVENOX) injection 30 mg (30 mg Subcutaneous Given 10/20/22 1731)  sodium chloride flush (NS) 0.9 % injection 3 mL (3 mLs Intravenous Given 10/20/22 1652)  acetaminophen (TYLENOL) tablet 650 mg (  has no administration in time range)    Or  acetaminophen (TYLENOL) suppository 650 mg (has no administration in time range)  polyethylene glycol (MIRALAX / GLYCOLAX) packet 17 g (has no administration in time range)  HYDROcodone-acetaminophen (NORCO/VICODIN) 5-325 MG per tablet 1 tablet (has no administration in time range)  HYDROmorphone (DILAUDID) injection 0.5 mg (0.5 mg Intravenous Given 10/20/22 1728)  metoprolol tartrate (LOPRESSOR) tablet 25 mg (25 mg Oral Given 10/20/22 1703)  ondansetron (ZOFRAN) injection 4 mg (4 mg Intravenous Given 10/20/22 1703)  sodium chloride 0.9 % bolus 1,000 mL (0 mLs Intravenous Stopped 10/20/22 1224)  cefTRIAXone (ROCEPHIN) 2 g in sodium chloride 0.9 % 100 mL IVPB (0 g Intravenous Stopped 10/20/22 1157)  doxycycline (VIBRAMYCIN) 100 mg in sodium chloride 0.9 % 250 mL IVPB (0 mg Intravenous Stopped 10/20/22 1444)  HYDROmorphone (DILAUDID) injection 0.5 mg (0.5 mg  Intravenous Given 10/20/22 1051)  iohexol (OMNIPAQUE) 350 MG/ML injection 60 mL (60 mLs Intravenous Contrast Given 10/20/22 1213)  HYDROmorphone (DILAUDID) injection 0.5 mg (0.5 mg Intravenous Given 10/20/22 1339)  sodium chloride 0.9 % bolus 1,000 mL (1,000 mLs Intravenous New Bag/Given 10/20/22 1341)  magnesium sulfate IVPB 2 g 50 mL (2 g Intravenous New Bag/Given 10/20/22 1730)  metoprolol tartrate (LOPRESSOR) injection 2.5 mg (2.5 mg Intravenous Given 10/20/22 1705)    ED Course/ Medical Decision Making/ A&P Clinical Course as of 10/20/22 1913  Fri Oct 20, 2022  1020 With the patient's leukocytosis, which is significant even in the setting of steroid use, as well as concern for opacity and pneumonia on her x-ray, and potential cellulitis of left lower extremity, I have ordered Rocephin and doxycycline to cover for both community pneumonia as well as likely staph coverage for cellulitis.  I have also ordered blood cultures for sepsis workup [MT]  1149 Lactic Acid, Venous: 1.6 [MT]  1350 Admitted to hospitalist [MT]  1350 Pt and daughter in law updated about results.  I have ordered a vascular ultrasound of bilateral extremities given her complaint of heaviness in the right leg as well as the redness and swelling of left lower leg, no history of DVT.  I have ordered an additional small 0.5 mg of Dilaudid.  She has received her will receive 2 L in total of normal saline fluids per sepsis protocol.  No evidence of severe sepsis or shock at this time.  She has been provided with some food at this time.  She is stable for medical admission [MT]    Clinical Course User Index [MT] Elai Vanwyk, Kermit Balo, MD                             Medical Decision Making Amount and/or Complexity of Data Reviewed Labs: ordered. Decision-making details documented in ED Course. Radiology: ordered.  Risk Prescription drug management. Decision regarding hospitalization.   This patient presents to the ED with concern  for generalized fatigue, cough, light sensitivity. This involves an extensive number of treatment options, and is a complaint that carries with it a high risk of complications and morbidity.  The differential diagnosis includes viral illness versus other infection   With her ongoing cough for 2 weeks have ordered chest x-ray to evaluate for pneumonia.  She reports a history of blood clots, has pain in her right inguinal region, as well as tachycardia.  I have ordered D-dimer testing, would consider further blood clot evaluation, although peripheral DVT should not be causing this type of  fatigue and weakness.  Co-morbidities that complicate the patient evaluation: History of DVT in the past, at high risk of recurring thrombosis  External records from outside source obtained and reviewed including hospital discharge summary from December 2023 noting that the patient has a history of remote DVT no longer anticoagulation, esophageal stricture, gastric ulcer, hypothyroidism, hyperlipidemia, coronary disease, esophageal stricture, admitted at that time to the hospital for aphasia with vertebral stenosis and placed on aspirin and Plavix for 3 weeks and then aspirin thereafter  I ordered and personally interpreted labs.  The pertinent results include:  NA 125, glucose 64, Cr 1.3, WBC 21.4. Ddimer 8.67  I ordered imaging studies including dg chest, ct abdomen pelvis, CTPE I visualized and interpreted imaging which showed likely pneumonia, left inguinal fat hernia with nonspecific stranding; no specific findings to correlate with right inguinal discomfofrt I agree with the radiologist interpretation  The patient was maintained on a cardiac monitor.  I personally viewed and interpreted the cardiac monitored which showed an underlying rhythm of: sinus rhythm and sinus tachycardia  Per my interpretation the patient's ECG shows no acute ischemic findings  I ordered medication including antibiotics, fluids for  PNA, cellulitis, and hyponatremia  I have reviewed the patients home medicines and have made adjustments as needed   After the interventions noted above, I reevaluated the patient and found that they have: stayed the same   Vascular ultrasound obtained at the time of admission, per my review showing no acute occlusion/DVT.  Dispostion:  After consideration of the diagnostic results and the patients response to treatment, I feel that the patent would benefit from medical admission.         Final Clinical Impression(s) / ED Diagnoses Final diagnoses:  Pneumonia due to infectious organism, unspecified laterality, unspecified part of lung  Hyponatremia  Cellulitis of left lower extremity    Rx / DC Orders ED Discharge Orders     None         Terald Sleeper, MD 10/20/22 [phone]

## 2022-10-20 NOTE — ED Triage Notes (Signed)
Pt bib ems from home; c/o generalized weakness x 2 weeks; states thought she had bad allergies , went to [facility], prescribed prednisone, since then, increase in mobility issues, unable to sit up; cbg 93, BP 12/72, HR 88, 98% RA; no fever, no N/V; non-productive cough; denies sob; hx arthritis; c/o pain in shoulder and pain to L knee, hx knee replacement

## 2022-10-21 DIAGNOSIS — A413 Sepsis due to Hemophilus influenzae: Secondary | ICD-10-CM | POA: Diagnosis present

## 2022-10-21 DIAGNOSIS — G459 Transient cerebral ischemic attack, unspecified: Secondary | ICD-10-CM | POA: Diagnosis not present

## 2022-10-21 DIAGNOSIS — R7989 Other specified abnormal findings of blood chemistry: Secondary | ICD-10-CM | POA: Diagnosis present

## 2022-10-21 DIAGNOSIS — J189 Pneumonia, unspecified organism: Secondary | ICD-10-CM | POA: Diagnosis present

## 2022-10-21 DIAGNOSIS — E669 Obesity, unspecified: Secondary | ICD-10-CM | POA: Diagnosis present

## 2022-10-21 DIAGNOSIS — K219 Gastro-esophageal reflux disease without esophagitis: Secondary | ICD-10-CM | POA: Diagnosis present

## 2022-10-21 DIAGNOSIS — R531 Weakness: Secondary | ICD-10-CM | POA: Diagnosis present

## 2022-10-21 DIAGNOSIS — G8929 Other chronic pain: Secondary | ICD-10-CM | POA: Diagnosis present

## 2022-10-21 DIAGNOSIS — Z66 Do not resuscitate: Secondary | ICD-10-CM | POA: Diagnosis present

## 2022-10-21 DIAGNOSIS — A419 Sepsis, unspecified organism: Secondary | ICD-10-CM | POA: Insufficient documentation

## 2022-10-21 DIAGNOSIS — N1831 Chronic kidney disease, stage 3a: Secondary | ICD-10-CM | POA: Diagnosis present

## 2022-10-21 DIAGNOSIS — Z1152 Encounter for screening for COVID-19: Secondary | ICD-10-CM | POA: Diagnosis not present

## 2022-10-21 DIAGNOSIS — E039 Hypothyroidism, unspecified: Secondary | ICD-10-CM | POA: Diagnosis present

## 2022-10-21 DIAGNOSIS — N3281 Overactive bladder: Secondary | ICD-10-CM | POA: Diagnosis present

## 2022-10-21 DIAGNOSIS — M19012 Primary osteoarthritis, left shoulder: Secondary | ICD-10-CM | POA: Diagnosis present

## 2022-10-21 DIAGNOSIS — E876 Hypokalemia: Secondary | ICD-10-CM | POA: Diagnosis not present

## 2022-10-21 DIAGNOSIS — M19011 Primary osteoarthritis, right shoulder: Secondary | ICD-10-CM | POA: Diagnosis present

## 2022-10-21 DIAGNOSIS — I441 Atrioventricular block, second degree: Secondary | ICD-10-CM | POA: Diagnosis not present

## 2022-10-21 DIAGNOSIS — I129 Hypertensive chronic kidney disease with stage 1 through stage 4 chronic kidney disease, or unspecified chronic kidney disease: Secondary | ICD-10-CM | POA: Diagnosis present

## 2022-10-21 DIAGNOSIS — I2510 Atherosclerotic heart disease of native coronary artery without angina pectoris: Secondary | ICD-10-CM | POA: Diagnosis present

## 2022-10-21 DIAGNOSIS — I6501 Occlusion and stenosis of right vertebral artery: Secondary | ICD-10-CM | POA: Diagnosis present

## 2022-10-21 DIAGNOSIS — F411 Generalized anxiety disorder: Secondary | ICD-10-CM | POA: Diagnosis present

## 2022-10-21 DIAGNOSIS — E871 Hypo-osmolality and hyponatremia: Secondary | ICD-10-CM | POA: Diagnosis present

## 2022-10-21 DIAGNOSIS — M419 Scoliosis, unspecified: Secondary | ICD-10-CM | POA: Diagnosis present

## 2022-10-21 DIAGNOSIS — I10 Essential (primary) hypertension: Secondary | ICD-10-CM | POA: Diagnosis not present

## 2022-10-21 DIAGNOSIS — E7800 Pure hypercholesterolemia, unspecified: Secondary | ICD-10-CM | POA: Diagnosis present

## 2022-10-21 DIAGNOSIS — I471 Supraventricular tachycardia, unspecified: Secondary | ICD-10-CM | POA: Diagnosis not present

## 2022-10-21 DIAGNOSIS — L03116 Cellulitis of left lower limb: Secondary | ICD-10-CM | POA: Diagnosis present

## 2022-10-21 HISTORY — DX: Sepsis, unspecified organism: A41.9

## 2022-10-21 LAB — BASIC METABOLIC PANEL
Anion gap: 13 (ref 5–15)
Anion gap: 14 (ref 5–15)
BUN: 50 mg/dL — ABNORMAL HIGH (ref 8–23)
BUN: 53 mg/dL — ABNORMAL HIGH (ref 8–23)
CO2: 20 mmol/L — ABNORMAL LOW (ref 22–32)
CO2: 18 mmol/L — ABNORMAL LOW (ref 22–32)
Calcium: 7.8 mg/dL — ABNORMAL LOW (ref 8.9–10.3)
Calcium: 7.7 mg/dL — ABNORMAL LOW (ref 8.9–10.3)
Chloride: 94 mmol/L — ABNORMAL LOW (ref 98–111)
Chloride: 94 mmol/L — ABNORMAL LOW (ref 98–111)
Creatinine, Ser: 1.2 mg/dL — ABNORMAL HIGH (ref 0.44–1.00)
Creatinine, Ser: 1.29 mg/dL — ABNORMAL HIGH (ref 0.44–1.00)
GFR, Estimated: 45 mL/min — ABNORMAL LOW (ref >60)
GFR, Estimated: 41 mL/min — ABNORMAL LOW (ref >60)
Glucose, Bld: 100 mg/dL — ABNORMAL HIGH (ref 70–99)
Glucose, Bld: 81 mg/dL (ref 70–99)
Potassium: 3.8 mmol/L (ref 3.5–5.1)
Potassium: 3.9 mmol/L (ref 3.5–5.1)
Sodium: 127 mmol/L — ABNORMAL LOW (ref 135–145)
Sodium: 126 mmol/L — ABNORMAL LOW (ref 135–145)

## 2022-10-21 LAB — SODIUM, URINE, RANDOM: Sodium, Ur: 14 mmol/L

## 2022-10-21 LAB — CBC
HCT: 24.5 % — ABNORMAL LOW (ref 36.0–46.0)
Hemoglobin: 8.6 g/dL — ABNORMAL LOW (ref 12.0–15.0)
MCH: 30.5 pg (ref 26.0–34.0)
MCHC: 35.1 g/dL (ref 30.0–36.0)
MCV: 86.9 fL (ref 80.0–100.0)
Platelets: 154 10*3/uL (ref 150–400)
RBC: 2.82 MIL/uL — ABNORMAL LOW (ref 3.87–5.11)
RDW: 14.9 % (ref 11.5–15.5)
WBC: 15.3 10*3/uL — ABNORMAL HIGH (ref 4.0–10.5)
nRBC: 0.7 % — ABNORMAL HIGH (ref 0.0–0.2)

## 2022-10-21 LAB — STREP PNEUMONIAE URINARY ANTIGEN: Strep Pneumo Urinary Antigen: NEGATIVE

## 2022-10-21 LAB — GLUCOSE, CAPILLARY: Glucose-Capillary: 97 mg/dL (ref 70–99)

## 2022-10-21 LAB — MAGNESIUM: Magnesium: 2.1 mg/dL (ref 1.7–2.4)

## 2022-10-21 LAB — PROCALCITONIN: Procalcitonin: 7.41 ng/mL

## 2022-10-21 LAB — OSMOLALITY, URINE: Osmolality, Ur: 359 mOsm/kg (ref 300–900)

## 2022-10-21 MED ORDER — METOPROLOL TARTRATE 5 MG/5ML IV SOLN
5.0000 mg | Freq: Once | INTRAVENOUS | Status: AC
  Administered 2022-10-21: 5 mg via INTRAVENOUS
  Filled 2022-10-21: qty 5

## 2022-10-21 MED ORDER — HYDROMORPHONE HCL 1 MG/ML PO LIQD: 0.5000 mg | ORAL | Status: DC | PRN

## 2022-10-21 MED ORDER — HYDROCHLOROTHIAZIDE 25 MG PO TABS
25.0000 mg | ORAL_TABLET | Freq: Every day | ORAL | Status: DC
  Administered 2022-10-21 – 2022-10-25 (×5): 25 mg via ORAL
  Filled 2022-10-21 (×5): qty 1

## 2022-10-21 MED ORDER — DOXYCYCLINE HYCLATE 100 MG PO TABS
100.0000 mg | ORAL_TABLET | Freq: Two times a day (BID) | ORAL | Status: DC
  Administered 2022-10-21 – 2022-10-23 (×4): 100 mg via ORAL
  Filled 2022-10-21 (×4): qty 1

## 2022-10-21 MED ORDER — SODIUM CHLORIDE 0.9 % IV SOLN
100.0000 mg | Freq: Two times a day (BID) | INTRAVENOUS | Status: DC
  Administered 2022-10-21: 100 mg via INTRAVENOUS
  Filled 2022-10-21 (×2): qty 100

## 2022-10-21 NOTE — Progress Notes (Signed)
PROGRESS NOTE    [name]  ZOX:[id number] DOB: February 24, 1936 DOA: 10/20/2022 PCP: Doreene Nest, NP   Brief Narrative:  HPI: Kathleen Paul is a 87 y.o. female with medical history significant of TIA, CAD, CKD 3A, hypertension, hyperlipidemia, GERD, obesity, hypothyroidism, anxiety, DVT presenting with generalized weakness, cough, abdominal pain.   Patient reports 2 weeks of feeling generally unwell and has had associated nonproductive cough and worsening generalized weakness.  She has reported some pain at her right inguinal fold as well and has history of hernia on the left.  Has been able to pass stool, but last BM was 2 days ago in the setting of decreased p.o. intake..   She did see her PCP for phone visit/virtual visit was prescribed a course of prednisone about a week ago, which she has since completed and then prescribed azithromycin after she failed to improve on the prednisone.  She states that she has had trouble sleeping while she has been on the prednisone has had felt worse in that time period as well.   She additionally reports generalized pain as well as some specifically to her left shoulder and left knee; she reports this is worse after her ambulance ride and she is prone to pain due to some chronic arthritis issues. She she additionally has reported some light sensitivity but no stiffness in the neck nor significant headache; But has had some headache on and off in the setting of decreased p.o. intake.   She developed left lower extremity redness just this morning/overnight per family.   She denies fevers (but reports some readings around 99), chills, chest pain, shortness of breath, constipation, diarrhea, nausea, vomiting.   ED Course: Vital signs in the ED notable for heart rate in the 100s to 130s, blood pressure in the 130s to 160s systolic.  Lab workup included BMP with sodium 125, chloride 91, BUN 61, creatinine 1.31 which is stable, glucose 64, calcium 8.4.   CBC with hemoglobin stable at 10.4 and leukocytosis to 21.4.  Troponin mildly elevated on first check at 43 with repeat pending.  D-dimer elevated to 8.6.  Lactic acid normal.  COVID screen negative.  Urinalysis and blood cultures pending.  Chest x-ray showing ill-defined left middle lobe/left lower lobe opacity.  CTA PE study was negative for PE but did show left lower lobe changes and opacity consistent with pneumonia.  CTA abdomen pelvis showed diverticulosis without diverticulitis, left inguinal hernia with nonspecific fat stranding, levoscoliosis and osteoarthritis.  Magnesium level checked as well and showed to be low at 1.6.  Patient received Dilaudid, ceftriaxone, doxycycline, 2 L of IV fluids in the ED.  Assessment & Plan:   Principal Problem:   CAP (community acquired pneumonia) Active Problems:   TIA (transient ischemic attack)   Hypertension   Dyslipidemia   CAD (coronary artery disease)   Hypothyroidism   Gastroesophageal reflux disease   Chronic kidney disease, stage 3a   GAD (generalized anxiety disorder)   Cellulitis   Hyponatremia  Sepsis secondary to community-acquired pneumonia, POA: Meets criteria for sepsis based on tachycardia and leukocytosis with a source as pneumonia and left lower extremity cellulitis.  Comes in with cough, does have evidence of pneumonia on imaging and leukocytosis (but did complete a course of steroids outpatient). CTA negative for PE.  She is not hypoxic.  Continue Rocephin and doxycycline.  Awaiting urine antigen for Legionella and streptococci and cultures.  Procalcitonin elevated.  Left lower extremity cellulitis: No reports of trauma.  Does have mild to moderate cellulitis.  Continue Rocephin as above.   Acute hyponatremia: Presented with 125, given some normal saline, currently 126.  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  Will check serum osmolarity, urine osmolarity and urine sodium.  No IV fluids.  Monitor daily.  Hypomagnesemia: Replaced and  resolved.  CKD 3a: At baseline.  Right inguinal pain / Generalized pain > Has had some ongoing generalized pain, Additionally reports specific right inguinal pain. Nothing on imaging to indicate abnormality at the right inguinal area.  Unclear if this could be related to her neurogenic source considering her levo scoliosis noted on CT. Pain control with Tylenol for mild pain.  She tells me that she was having hard time swallowing the pill/Norco.  Would prefer liquid, she tells me that she got liquid Dilaudid yesterday which helped.  I will start her on liquid Dilaudid and continue and discontinue IV Dilaudid.   Essential hypertension -Pressure very well-controlled, continue home enalapril, metoprolol.    History of TIA > Last year had 3-hour episode of aphasia and diagnosed with TIA.  Noted to have severe vertebral artery stenosis on the right but not intervened upon.  Completed 3 months of Plavix now on aspirin alone. - Continue home aspirin, simvastatin   CAD Hyperlipidemia - Continue home simvastatin   Hypothyroidism - Continue on Synthroid   Obesity - Noted  Generalized weakness and bilateral chronic shoulder pain secondary to severe arthritis: Patient and daughters tell me that she has chronic severe pain in bilateral shoulders due to severe osteoarthritis but this has been worse lately.  She has been told that surgery is not an option for her since this is advanced arthritis.  Per daughter, she was unable to feed herself.  Looking at her, she appears very weak and deconditioned.  I will consult PT OT to assess her and suspect that she is going to require SNF discharge.  DVT prophylaxis: enoxaparin (LOVENOX) injection 30 mg Start: 10/20/22 1400   Code Status: DNR  Family Communication:  None present at bedside.  Plan of care discussed with patient in length and he/she verbalized understanding and agreed with it.  Status is: Observation The patient will require care spanning > 2  midnights and should be moved to inpatient because: Very weak with generalized pain.  Still has cellulitis.   Estimated body mass index is 29.29 kg/m as calculated from the following:   Height as of this encounter: 4\' 11"  (1.499 m).   Weight as of 10/13/22: 65.8 kg.    Nutritional Assessment: Body mass index is 29.29 kg/m.Marland Kitchen Seen by dietician.  I agree with the assessment and plan as outlined below: Nutrition Status:        . Skin Assessment: I have examined the patient's skin and I agree with the wound assessment as performed by the wound care RN as outlined below:    Consultants:  None  Procedures:  None  Antimicrobials:  Anti-infectives (From admission, onward)    Start     Dose/Rate Route Frequency Ordered Stop   10/21/22 1000  cefTRIAXone (ROCEPHIN) 2 g in sodium chloride 0.9 % 100 mL IVPB        2 g 200 mL/hr over 30 Minutes Intravenous Every 24 hours 10/20/22 1358 10/25/22 0959   10/21/22 0700  doxycycline (VIBRAMYCIN) 100 mg in sodium chloride 0.9 % 250 mL IVPB        100 mg 125 mL/hr over 120 Minutes Intravenous 2 times daily 10/21/22 0615  10/20/22 1030  cefTRIAXone (ROCEPHIN) 2 g in sodium chloride 0.9 % 100 mL IVPB        2 g 200 mL/hr over 30 Minutes Intravenous  Once 10/20/22 1020 10/20/22 1157   10/20/22 1030  doxycycline (VIBRAMYCIN) 100 mg in sodium chloride 0.9 % 250 mL IVPB        100 mg 125 mL/hr over 120 Minutes Intravenous  Once 10/20/22 1020 10/20/22 1444         Subjective: Patient seen and examined.  Daughter and DRIL at bedside.  Complains of dry cough, generalized weakness.  Bilateral shoulder pain.  Objective: Vitals:   10/20/22 1927 10/20/22 2333 10/21/22 0324 10/21/22 0818  BP: (!) 152/80 139/68 (!) 147/91 (!) 135/48  Pulse: (!) 105 89 93 92  Resp: 17 16 15 17   Temp: 97.9 F (36.6 C) 97.7 F (36.5 C) 98.8 F (37.1 C) 97.7 F (36.5 C)  TempSrc: Oral Oral Oral Oral  SpO2: 100% 97% 98% 96%  Height:        Intake/Output  Summary (Last 24 hours) at 10/21/2022 1011 Last data filed at 10/21/2022 1002 Gross per 24 hour  Intake 480 ml  Output 700 ml  Net -220 ml   There were no vitals filed for this visit.  Examination:  General exam: Appears calm and comfortable  Respiratory system: Rhonchi bilaterally.  Respiratory effort normal. Cardiovascular system: S1 & S2 heard, RRR. No JVD, murmurs, rubs, gallops or clicks. No pedal edema. Gastrointestinal system: Abdomen is nondistended, soft and nontender. No organomegaly or masses felt. Normal bowel sounds heard. Central nervous system: Alert and oriented. No focal neurological deficits. Extremities: Left lower extremity erythema. Psychiatry: Judgement and insight appear normal. Mood & affect appropriate.    Data Reviewed: I have personally reviewed following labs and imaging studies  CBC: Recent Labs  Lab 10/20/22 0927 10/21/22 0638  WBC 21.4* 15.3*  NEUTROABS 20.1*  --   HGB 10.4* 8.6*  HCT 30.2* 24.5*  MCV 88.6 86.9  PLT 201 154   Basic Metabolic Panel: Recent Labs  Lab 10/20/22 0927 10/20/22 1812 10/20/22 2217 10/21/22 0211 10/21/22 0638  NA 125* 127* 127* 127* 126*  K 4.2 3.9 3.8 3.8 3.9  CL 91* 94* 95* 94* 94*  CO2 22 17* 18* 20* 18*  GLUCOSE 64* 61* 113* 100* 81  BUN 61* 46* 50* 50* 53*  CREATININE 1.31* 1.17* 1.14* 1.20* 1.29*  CALCIUM 8.4* 7.7* 7.9* 7.8* 7.7*  MG 1.6*  --   --   --  2.1   GFR: Estimated Creatinine Clearance: 26.8 mL/min (A) (by C-G formula based on SCr of 1.29 mg/dL (H)). Liver Function Tests: No results for input(s): "AST", "ALT", "ALKPHOS", "BILITOT", "PROT", "ALBUMIN" in the last 168 hours. No results for input(s): "LIPASE", "AMYLASE" in the last 168 hours. No results for input(s): "AMMONIA" in the last 168 hours. Coagulation Profile: No results for input(s): "INR", "PROTIME" in the last 168 hours. Cardiac Enzymes: No results for input(s): "CKTOTAL", "CKMB", "CKMBINDEX", "TROPONINI" in the last 168  hours. BNP (last 3 results) No results for input(s): "PROBNP" in the last 8760 hours. HbA1C: No results for input(s): "HGBA1C" in the last 72 hours. CBG: No results for input(s): "GLUCAP" in the last 168 hours. Lipid Profile: No results for input(s): "CHOL", "HDL", "LDLCALC", "TRIG", "CHOLHDL", "LDLDIRECT" in the last 72 hours. Thyroid Function Tests: No results for input(s): "TSH", "T4TOTAL", "FREET4", "T3FREE", "THYROIDAB" in the last 72 hours. Anemia Panel: No results for input(s): "VITAMINB12", "FOLATE", "FERRITIN", "  TIBC", "IRON", "RETICCTPCT" in the last 72 hours. Sepsis Labs: Recent Labs  Lab 10/20/22 1045 10/20/22 1812 10/21/22 0638  PROCALCITON  --  7.98 7.41  LATICACIDVEN 1.6  --   --     Recent Results (from the past 240 hour(s))  SARS Coronavirus 2 by RT PCR (hospital order, performed in [facility] hospital lab) *cepheid single result test* Anterior Nasal Swab     Status: None   Collection Time: 10/20/22  9:21 AM   Specimen: Anterior Nasal Swab  Result Value Ref Range Status   SARS Coronavirus 2 by RT PCR NEGATIVE NEGATIVE Final    Comment: Performed at [facility], [address], [place], [place] [address]  Blood culture (routine x 2)     Status: None (Preliminary result)   Collection Time: 10/20/22 10:45 AM   Specimen: BLOOD RIGHT ARM  Result Value Ref Range Status   Specimen Description BLOOD RIGHT ARM  Final   Special Requests   Final    BOTTLES DRAWN AEROBIC AND ANAEROBIC Blood Culture adequate volume   Culture   Final    NO GROWTH < 24 HOURS Performed at [facility], [address] [place], [place] [address]    Report Status PENDING  Incomplete  Blood culture (routine x 2)     Status: None (Preliminary result)   Collection Time: 10/20/22  6:09 PM   Specimen: BLOOD  Result Value Ref Range Status   Specimen Description BLOOD SITE NOT SPECIFIED  Final   Special Requests   Final    BOTTLES DRAWN AEROBIC ONLY Blood Culture adequate volume    Culture   Final    NO GROWTH < 12 HOURS Performed at [facility], [address], [place], [place] [address]    Report Status PENDING  Incomplete     Radiology Studies: VAS [place] LOWER EXTREMITY VENOUS (DVT) (ONLY [facility] & [facility])  Result Date: 10/21/2022  Lower Venous DVT Study Patient Name:  FRONNIE URTON  Date of Exam:   10/20/2022 Medical Rec #: [id number]        Accession #:    [id number] Date of Birth: 07/22/1935         Patient Gender: F Patient Age:   48 years Exam Location:  [facility] Procedure:      VAS [place] LOWER EXTREMITY VENOUS (DVT) Referring Phys: MATTHEW TRIFAN --------------------------------------------------------------------------------  Indications: Pain in bilateral lower extremities- right groin, left knee, new erythema of left lower extremity x1 day. Other Indications: Remote history of DVT. Limitations: Poor ultrasound/tissue interface and patient pain/sensitivity to compressions. Comparison Study: No prior studies available. Performing Technologist: Jean Rosenthal RDMS, RVT  Examination Guidelines: A complete evaluation includes B-mode imaging, spectral Doppler, color Doppler, and power Doppler as needed of all accessible portions of each vessel. Bilateral testing is considered an integral part of a complete examination. Limited examinations for reoccurring indications may be performed as noted. The reflux portion of the exam is performed with the patient in reverse Trendelenburg.  +---------+---------------+---------+-----------+----------+-------------------+ RIGHT    CompressibilityPhasicitySpontaneityPropertiesThrombus Aging      +---------+---------------+---------+-----------+----------+-------------------+ CFV      Full           Yes      Yes                                      +---------+---------------+---------+-----------+----------+-------------------+ SFJ  Full                                                              +---------+---------------+---------+-----------+----------+-------------------+ FV Prox  Full                                                             +---------+---------------+---------+-----------+----------+-------------------+ FV Mid   Full           Yes      Yes                                      +---------+---------------+---------+-----------+----------+-------------------+ FV DistalFull           Yes      Yes                                      +---------+---------------+---------+-----------+----------+-------------------+ PFV      Full                                                             +---------+---------------+---------+-----------+----------+-------------------+ POP      Full           Yes      Yes                                      +---------+---------------+---------+-----------+----------+-------------------+ PTV      Full                                                             +---------+---------------+---------+-----------+----------+-------------------+ PERO                                                  Not well visualized +---------+---------------+---------+-----------+----------+-------------------+   +---------+---------------+---------+-----------+---------------+--------------+ LEFT     CompressibilityPhasicitySpontaneityProperties     Thrombus Aging +---------+---------------+---------+-----------+---------------+--------------+ CFV      Full           Yes      Yes                                      +---------+---------------+---------+-----------+---------------+--------------+ SFJ      Full                                                             +---------+---------------+---------+-----------+---------------+--------------+  FV Prox  Full                                                             +---------+---------------+---------+-----------+---------------+--------------+ FV  Mid                  Yes      Yes        Patent by color               +---------+---------------+---------+-----------+---------------+--------------+ FV Distal               Yes      Yes        Patent by color               +---------+---------------+---------+-----------+---------------+--------------+ PFV      Full                                                             +---------+---------------+---------+-----------+---------------+--------------+ POP      Full           Yes      Yes                                      +---------+---------------+---------+-----------+---------------+--------------+ PTV      Full                                                             +---------+---------------+---------+-----------+---------------+--------------+ PERO     Full                                              Some segments                                                             not well                                                                  visualized     +---------+---------------+---------+-----------+---------------+--------------+     Summary: RIGHT: - There is no evidence of deep vein thrombosis in the lower extremity. However, portions of this examination were limited- see technologist comments above.  - No cystic structure found in the popliteal fossa.  LEFT: - There is no evidence of deep vein thrombosis  in the lower extremity. However, portions of this examination were limited- see technologist comments above.  - No cystic structure found in the popliteal fossa.  *See table(s) above for measurements and observations. Electronically signed by Gerarda Fraction on 10/21/2022 at 9:13:51 AM.    Final    CT ABDOMEN PELVIS W CONTRAST  Result Date: 10/20/2022 CLINICAL DATA:  Left lung opacity, inguinal hernia with worsening pain. EXAM: CT ANGIOGRAPHY CHEST CT ABDOMEN AND PELVIS WITH CONTRAST TECHNIQUE: Multidetector CT imaging of the  chest was performed using the standard protocol during bolus administration of intravenous contrast. Multiplanar CT image reconstructions and MIPs were obtained to evaluate the vascular anatomy. Multidetector CT imaging of the abdomen and pelvis was performed using the standard protocol during bolus administration of intravenous contrast. RADIATION DOSE REDUCTION: This exam was performed according to the departmental dose-optimization program which includes automated exposure control, adjustment of the mA and/or kV according to patient size and/or use of iterative reconstruction technique. CONTRAST:  70mL OMNIPAQUE IOHEXOL 350 MG/ML SOLN COMPARISON:  Chest radiograph performed earlier on the same date. FINDINGS: CTA CHEST FINDINGS Cardiovascular: Satisfactory opacification of the pulmonary arteries to the segmental level. No evidence of pulmonary embolism. Heart is normal in size. Coronary artery atherosclerotic calcifications. Mediastinum/Nodes: No enlarged mediastinal, hilar, or axillary lymph nodes. Thyroid and trachea are within normal limits. Large hiatal hernia with partial intrathoracic stomach. Lungs/Pleura: Lower lobe opacity with air bronchograms concerning for pneumonia. Right basilar subsegmental linear atelectasis and small right pleural effusion. Musculoskeletal: Thoracic kyphoscoliosis with multilevel degenerate disc disease. No acute chest wall abnormality. Moderate bilateral glenohumeral osteoarthritis. Review of the MIP images confirms the above findings. CT ABDOMEN and PELVIS FINDINGS Hepatobiliary: No focal liver abnormality is seen. No gallstones, gallbladder wall thickening, or biliary dilatation. Pancreas: Unremarkable. No pancreatic ductal dilatation or surrounding inflammatory changes. Spleen: Normal in size without focal abnormality. Adrenals/Urinary Tract: Adrenal glands are unremarkable. Kidneys are normal, without renal calculi, focal lesion, or hydronephrosis. Bladder is unremarkable.  Multiple bilateral small renal cortical cysts, likely benign process. Stomach/Bowel: Moderate size hiatal hernia with partially intrathoracic stomach without evidence of obstruction. Small bowel loops are normal in caliber. Appendix not identified, however no inflammatory changes in the pericecal region. Sigmoid colonic diverticulosis without evidence of acute diverticulitis. Vascular/Lymphatic: Moderate aortic atherosclerotic calcification of aorta and branch vessels. No enlarged abdominal or pelvic lymph nodes. Reproductive: Status post hysterectomy. No adnexal masses. Other: Fat containing left inguinal hernia without evidence of obstruction. There is also fat containing umbilical hernia. Nonspecific fat stranding in the left inguinal region subcutaneous soft tissues. Musculoskeletal: Moderate-to-severe levoscoliosis of the lumbar spine centered at L4 vertebral body with partial left lateral subluxation of the L3. 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 Advanced right hip osteoarthritis with near complete loss of the articular cartilage and subchondral cystic changes. Review of the MIP images confirms the above findings. IMPRESSION: CT angio chest: 1. No evidence of pulmonary embolism. 2. Left lower lobe opacity with air bronchograms concerning for pneumonia. Follow-up examination to resolution is recommended. 3. Large hiatal hernia with partial intrathoracic stomach. CT abdomen/pelvis: 1. Sigmoid colonic diverticulosis without evidence of acute diverticulitis. 2. Fat containing left inguinal hernia without evidence of obstruction. 3. Nonspecific left inguinal region fat stranding without evidence of fluid collection or abscess. Correlate with physical examination findings. 4. Moderate-to-severe levoscoliosis of the lumbar spine centered at L3 vertebral body with partial left lateral subluxation of the L4 vertebral body. Advanced  multilevel  degenerate disc disease of the lumbar spine with associated multilevel facet joint arthropathy. 5. Advanced right hip osteoarthritis with near complete loss of the articular cartilage and subchondral cystic changes. 6. Aortic Atherosclerosis (ICD10-I70.0). Electronically Signed   By: Larose Hires D.O.   On: 10/20/2022 12:52   CT Angio Chest PE W and/or Wo Contrast  Result Date: 10/20/2022 CLINICAL DATA:  Left lung opacity, inguinal hernia with worsening pain. EXAM: CT ANGIOGRAPHY CHEST CT ABDOMEN AND PELVIS WITH CONTRAST TECHNIQUE: Multidetector CT imaging of the chest was performed using the standard protocol during bolus administration of intravenous contrast. Multiplanar CT image reconstructions and MIPs were obtained to evaluate the vascular anatomy. Multidetector CT imaging of the abdomen and pelvis was performed using the standard protocol during bolus administration of intravenous contrast. RADIATION DOSE REDUCTION: This exam was performed according to the departmental dose-optimization program which includes automated exposure control, adjustment of the mA and/or kV according to patient size and/or use of iterative reconstruction technique. CONTRAST:  60mL OMNIPAQUE IOHEXOL 350 MG/ML SOLN COMPARISON:  Chest radiograph performed earlier on the same date. FINDINGS: CTA CHEST FINDINGS Cardiovascular: Satisfactory opacification of the pulmonary arteries to the segmental level. No evidence of pulmonary embolism. Heart is normal in size. Coronary artery atherosclerotic calcifications. Mediastinum/Nodes: No enlarged mediastinal, hilar, or axillary lymph nodes. Thyroid and trachea are within normal limits. Large hiatal hernia with partial intrathoracic stomach. Lungs/Pleura: Lower lobe opacity with air bronchograms concerning for pneumonia. Right basilar subsegmental linear atelectasis and small right pleural effusion. Musculoskeletal: Thoracic kyphoscoliosis with multilevel degenerate disc disease.  No acute chest wall abnormality. Moderate bilateral glenohumeral osteoarthritis. Review of the MIP images confirms the above findings. CT ABDOMEN and PELVIS FINDINGS Hepatobiliary: No focal liver abnormality is seen. No gallstones, gallbladder wall thickening, or biliary dilatation. Pancreas: Unremarkable. No pancreatic ductal dilatation or surrounding inflammatory changes. Spleen: Normal in size without focal abnormality. Adrenals/Urinary Tract: Adrenal glands are unremarkable. Kidneys are normal, without renal calculi, focal lesion, or hydronephrosis. Bladder is unremarkable. Multiple bilateral small renal cortical cysts, likely benign process. Stomach/Bowel: Moderate size hiatal hernia with partially intrathoracic stomach without evidence of obstruction. Small bowel loops are normal in caliber. Appendix not identified, however no inflammatory changes in the pericecal region. Sigmoid colonic diverticulosis without evidence of acute diverticulitis. Vascular/Lymphatic: Moderate aortic atherosclerotic calcification of aorta and branch vessels. No enlarged abdominal or pelvic lymph nodes. Reproductive: Status post hysterectomy. No adnexal masses. Other: Fat containing left inguinal hernia without evidence of obstruction. There is also fat containing umbilical hernia. Nonspecific fat stranding in the left inguinal region subcutaneous soft tissues. Musculoskeletal: Moderate-to-severe levoscoliosis of the lumbar spine centered at L4 vertebral body with partial left lateral subluxation of the L3. 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 Advanced right hip osteoarthritis with near complete loss of the articular cartilage and subchondral cystic changes. Review of the MIP images confirms the above findings. IMPRESSION: CT angio chest: 1. No evidence of pulmonary embolism. 2. Left lower lobe opacity with air bronchograms concerning for pneumonia.  Follow-up examination to resolution is recommended. 3. Large hiatal hernia with partial intrathoracic stomach. CT abdomen/pelvis: 1. Sigmoid colonic diverticulosis without evidence of acute diverticulitis. 2. Fat containing left inguinal hernia without evidence of obstruction. 3. Nonspecific left inguinal region fat stranding without evidence of fluid collection or abscess. Correlate with physical examination findings. 4. Moderate-to-severe levoscoliosis of the lumbar spine centered at L3 vertebral body with partial left lateral subluxation of the L4 vertebral body.  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 5. Advanced right hip osteoarthritis with near complete loss of the articular cartilage and subchondral cystic changes. 6. Aortic Atherosclerosis (ICD10-I70.0). Electronically Signed   By: Larose Hires D.O.   On: 10/20/2022 12:52   DG Chest 2 View  Result Date: 10/20/2022 CLINICAL DATA:  Provided history: Weakness. Cough. Pneumonia evaluation. EXAM: CHEST - 2 VIEW COMPARISON:  Chest radiographs 07/05/2021 and earlier. CT abdomen/pelvis 08/20/2012. FINDINGS: Heart size within normal limits. Aortic atherosclerosis. Large hiatal hernia. Ill-defined opacity within the mid to lower left lung. Small left pleural effusion. No appreciable airspace consolidation on the right. No evidence of pneumothorax. No acute osseous abnormality identified. Degenerative changes of the spine and bilateral glenohumeral joints. Chronic appearing deformity of the metadiaphysis of the proximal right humerus. IMPRESSION: 1. Ill-defined opacity within the mid to lower left lung, which may reflect atelectasis and/or pneumonia. 2. Small left pleural effusion. 3. Large hiatal hernia. 4.  Aortic Atherosclerosis (ICD10-I70.0). Electronically Signed   By: Jackey Loge D.O.   On: 10/20/2022 10:14    Scheduled Meds:  aspirin EC  81 mg Oral Daily   enalapril  10 mg Oral  Daily   enoxaparin (LOVENOX) injection  30 mg Subcutaneous Q24H   levothyroxine  88 mcg Oral Q0600   metoprolol tartrate  25 mg Oral QPM   simvastatin  20 mg Oral QPM   sodium chloride flush  3 mL Intravenous Q12H   Continuous Infusions:  cefTRIAXone (ROCEPHIN)  IV 2 g (10/21/22 0909)   doxycycline (VIBRAMYCIN) IV 100 mg (10/21/22 0655)     LOS: 0 days   Hughie Closs, MD [facility]  10/21/2022, 10:11 AM   *Please note that this is a verbal dictation therefore any spelling or grammatical errors are due to the "Dragon [employer] One" system interpretation.  Please page via Amion and do not message via secure chat for urgent patient care matters. Secure chat can be used for non urgent patient care matters.  How to contact the [facility] Attending or Consulting provider 7A - 7P or covering provider during after hours 7P -7A, for this patient?  Check the care team in [facility] and look for a) attending/consulting [facility] provider listed and b) the [facility] team listed. Page or secure chat 7A-7P. Log into [email or web address] and use [place]'s universal password to access. If you do not have the password, please contact the hospital operator. Locate the [facility] provider you are looking for under [facility] and page to a number that you can be directly reached. If you still have difficulty reaching the provider, please page the [facility] (Director on Call) for the Hospitalists listed on amion for assistance.

## 2022-10-21 NOTE — Plan of Care (Signed)
  Problem: Activity: Goal: Ability to tolerate increased activity will improve Outcome: Progressing   Problem: Clinical Measurements: Goal: Ability to maintain a body temperature in the normal range will improve Outcome: Progressing   Problem: Respiratory: Goal: Ability to maintain adequate ventilation will improve Outcome: Progressing Goal: Ability to maintain a clear airway will improve Outcome: Progressing   

## 2022-10-21 NOTE — Evaluation (Signed)
Occupational Therapy Evaluation Patient Details Name: Kathleen Paul MRN: [id number] DOB: [birth year] Today's Date: 10/21/2022   History of Present Illness Pt is an 87 y.o. female who presented 10/20/22 with generalized weakness, cough, and abdominal pain. Pt admitted with sepsis secondary to CAP, L lower extremity cellulitis, and acute hyponatremia. PMH: TIA, CAD, CKD 3A, HTN, HLD, GERD, obesity, hypothyroidism, anxiety, DVT, cancer, scoliosis   Clinical Impression   Kathleen Paul was evaluated s/p the above admission list. She lives with her daughter who works full time and assists the pt with ADLs and IADLs at baseline. Upon evaluation pt was limited by pain, anxiety, limited bilat shoulder ROM, decreased activity tolerance and weakness. Overall she required total A for bed mobility and was min G for sitting balance. Due to the deficits listed below she also needs max-total A +2 for ADLs. Pt will benefit from continued acute OT services and skilled inpatient follow up therapy, <3 hours/day.      Recommendations for follow up therapy are one component of a multi-disciplinary discharge planning process, led by the attending physician.  Recommendations may be updated based on patient status, additional functional criteria and insurance authorization.   Assistance Recommended at Discharge Frequent or constant Supervision/Assistance  Patient can return home with the following A lot of help with walking and/or transfers;A lot of help with bathing/dressing/bathroom;Assistance with cooking/housework;Assistance with feeding;Direct supervision/assist for financial management;Direct supervision/assist for medications management;Assist for transportation;Help with stairs or ramp for entrance    Functional Status Assessment  Patient has had a recent decline in their functional status and demonstrates the ability to make significant improvements in function in a reasonable and predictable amount of time.  Equipment  Recommendations  None recommended by OT    Recommendations for Other Services       Precautions / Restrictions Precautions Precautions: Fall Restrictions Weight Bearing Restrictions: No      Mobility Bed Mobility Overal bed mobility: Needs Assistance Bed Mobility: Rolling     Supine to sit: Total assist, +2 for physical assistance, +2 for safety/equipment, HOB elevated Sit to supine: Total assist, +2 for physical assistance, +2 for safety/equipment, HOB elevated   General bed mobility comments: Pt needing >5 minutes and step-by-step cues and AAROM to slide each leg towards and off L EOB, one by one. Pt needing total assist x2 to pivot hips using bed pad and ascend trunk to sit up EOB with support under her mid back due to pain at shoulders. Total assist x2 to transition back to supine.    Transfers                   General transfer comment: Unable due to pain today      Balance Overall balance assessment: Needs assistance Sitting-balance support: Single extremity supported, No upper extremity supported, Feet supported Sitting balance-Leahy Scale: Fair Sitting balance - Comments: Able to sit statically with min guard-minA for balance, limited by pain       Standing balance comment: deferred                           ADL either performed or assessed with clinical judgement   ADL Overall ADL's : Needs assistance/impaired Eating/Feeding: Set up;Sitting   Grooming: Total assistance Grooming Details (indicate cue type and reason): dtr does grooming at baseline Upper Body Bathing: Maximal assistance   Lower Body Bathing: Total assistance;+2 for physical assistance;+2 for safety/equipment   Upper Body Dressing : Maximal assistance  Lower Body Dressing: Total assistance;+2 for physical assistance;+2 for safety/equipment   Toilet Transfer: Total assistance;+2 for physical assistance;+2 for safety/equipment   Toileting- Clothing Manipulation and  Hygiene: Total assistance;+2 for safety/equipment;+2 for physical assistance       Functional mobility during ADLs: Total assistance;+2 for physical assistance;+2 for safety/equipment General ADL Comments: limited by pain and poor bilat hsoulder ROM     Vision Baseline Vision/History: 0 No visual deficits Vision Assessment?: No apparent visual deficits     Perception Perception Perception Tested?: No   Praxis Praxis Praxis tested?: Not tested    Pertinent Vitals/Pain Pain Assessment Pain Assessment: 0-10 Faces Pain Scale: Hurts whole lot Pain Location: Bil shoulder, bil legs, groin, generalized Pain Descriptors / Indicators: Discomfort, Grimacing, Guarding, Moaning, Crying Pain Intervention(s): Limited activity within patient's tolerance, Monitored during session     Hand Dominance Right   Extremity/Trunk Assessment Upper Extremity Assessment Upper Extremity Assessment: RUE deficits/detail;LUE deficits/detail RUE Deficits / Details: very limited ROM at shoulders, globally weak but full ROM of ebow, wrist and hands RUE Coordination: decreased gross motor LUE Deficits / Details: very limited ROM at shoulders, globally weak but full ROM of ebow, wrist and hands LUE Coordination: decreased gross motor   Lower Extremity Assessment Lower Extremity Assessment: Defer to PT evaluation RLE Deficits / Details: purple discoloration of foot/toes with gravity dependent position (baseline per pt); achieves about neutral ankle dorsiflexion AAROM but rests in inversion; generalized weakness, able to extend knee against gravity better than her L but still very weak, likely MMT score < 3+ LLE Deficits / Details: erythema noted in lower leg; achieves about -5 degrees ankle dorsiflexion AAROM but rests in inversion; generalized weakness, able to extend knee against gravity worse on her L than her R, likely MMT score < 3   Cervical / Trunk Assessment Cervical / Trunk Assessment: Kyphotic    Communication Communication Communication: No difficulties   Cognition Arousal/Alertness: Awake/alert Behavior During Therapy: Anxious, WFL for tasks assessed/performed Overall Cognitive Status: Within Functional Limits for tasks assessed                                 General Comments: Pt anxious in regards to pain but overall pleasant and appreciative. Follows commands appropriately, but again limited in initiation by pain.     General Comments  educated pt and family on rolling schedule and floating heels to prevent pressure wounds - notified RN of plan also    Exercises     Shoulder Instructions      Home Living Family/patient expects to be discharged to:: Private residence Living Arrangements: Children Available Help at Discharge: Family;Available PRN/intermittently Type of Home: House Home Access: Ramped entrance     Home Layout: Able to live on main level with bedroom/bathroom;Two level     Bathroom Shower/Tub: Chief Strategy Officer: Standard (with riser)     Home Equipment: Rollator (4 wheels);Shower seat   Additional Comments: sleeps in recliner      Prior Functioning/Environment Prior Level of Function : Needs assist       Physical Assist : ADLs (physical)     Mobility Comments: Mod I using rollator, no falls in past 6 months at least ADLs Comments: Pt has limited bil shoulder AROM, thus daughter assists with lower body ADLs and cleaning face/hair and with grooming; pt was mod I using the bathroom and would assist with upper body ADLs  OT Problem List: Decreased strength;Decreased range of motion;Decreased activity tolerance;Impaired balance (sitting and/or standing);Decreased safety awareness;Decreased knowledge of use of DME or AE;Decreased knowledge of precautions;Pain      OT Treatment/Interventions: Self-care/ADL training;Therapeutic exercise;DME and/or AE instruction;Balance training;Patient/family  education;Therapeutic activities    OT Goals(Current goals can be found in the care plan section) Acute Rehab OT Goals Patient Stated Goal: less pain OT Goal Formulation: With patient Time For Goal Achievement: 11/04/22 Potential to Achieve Goals: Good ADL Goals Pt Will Perform Upper Body Dressing: with min assist;sitting Pt Will Perform Lower Body Dressing: with mod assist;sit to/from stand Pt Will Transfer to Toilet: with mod assist;stand pivot transfer;bedside commode Additional ADL Goal #1: pt will complete bed mobility with mod A as a precursor to ADLs  OT Frequency: Min 2X/week    Co-evaluation PT/OT/SLP Co-Evaluation/Treatment: Yes Reason for Co-Treatment: For patient/therapist safety;To address functional/ADL transfers PT goals addressed during session: Mobility/safety with mobility;Balance OT goals addressed during session: ADL's and self-care      AM-PAC OT "6 Clicks" Daily Activity     Outcome Measure Help from another person eating meals?: A Little Help from another person taking care of personal grooming?: A Lot Help from another person toileting, which includes using toliet, bedpan, or urinal?: Total Help from another person bathing (including washing, rinsing, drying)?: A Lot Help from another person to put on and taking off regular upper body clothing?: A Lot Help from another person to put on and taking off regular lower body clothing?: Total 6 Click Score: 11   End of Session Nurse Communication: Mobility status (rolling schedule, pain meds)  Activity Tolerance: Patient limited by pain Patient left: in bed;with call bell/phone within reach;with bed alarm set;with family/visitor present  OT Visit Diagnosis: Unsteadiness on feet (R26.81);Other abnormalities of gait and mobility (R26.89);Muscle weakness (generalized) (M62.81);Pain                Time: 7829-5621 OT Time Calculation (min): 19 min Charges:  OT General Charges $OT Visit: 1 Visit OT Evaluation $OT  Eval Moderate Complexity: 1 Mod  Derenda Mis, OTR/L Acute Rehabilitation Services Office [phone] Secure Chat Communication Preferred   Donia Pounds 10/21/2022, 2:33 PM

## 2022-10-21 NOTE — Evaluation (Signed)
Physical Therapy Evaluation Patient Details Name: Kathleen Paul MRN: [id number] DOB: 1935-10-24 Today's Date: 10/21/2022  History of Present Illness  Pt is an 87 y.o. female who presented 10/20/22 with generalized weakness, cough, and abdominal pain. Pt admitted with sepsis secondary to CAP, L lower extremity cellulitis, and acute hyponatremia. PMH: TIA, CAD, CKD 3A, HTN, HLD, GERD, obesity, hypothyroidism, anxiety, DVT, cancer, scoliosis   Clinical Impression  Pt presents with condition above and deficits mentioned below, see PT Problem List. PTA, she was mod I using a rollator for functional mobility. She lives with her daughter in a 2-level house (stays on the main level though) with a ramped entrance. Currently, pt is limited primarily by pain throughout her body, impacting her AROM, strength, and activity tolerance. Pt required total assist x2 for bed mobility and min guard-minA for sitting balance. Pt crying and shaking with pain just sitting EOB, thus deferred attempts at OOB mobility this date. As her daughter works and she does not have someone who could assist her during the day at home, she could greatly benefit from short-term inpatient rehab, <3 hours/day, to maximize her return to baseline prior to return home. Will continue to follow acutely.     Recommendations for follow up therapy are one component of a multi-disciplinary discharge planning process, led by the attending physician.  Recommendations may be updated based on patient status, additional functional criteria and insurance authorization.  Follow Up Recommendations Can patient physically be transported by private vehicle: No     Assistance Recommended at Discharge Intermittent Supervision/Assistance  Patient can return home with the following  Two people to help with walking and/or transfers;A lot of help with bathing/dressing/bathroom;Assistance with cooking/housework;Assist for transportation;Help with stairs or ramp for  entrance    Equipment Recommendations BSC/3in1;Wheelchair (measurements PT);Wheelchair cushion (measurements PT)  Recommendations for Other Services       Functional Status Assessment Patient has had a recent decline in their functional status and demonstrates the ability to make significant improvements in function in a reasonable and predictable amount of time.     Precautions / Restrictions Precautions Precautions: Fall Restrictions Weight Bearing Restrictions: No      Mobility  Bed Mobility Overal bed mobility: Needs Assistance Bed Mobility: Supine to Sit, Sit to Supine     Supine to sit: Total assist, +2 for physical assistance, +2 for safety/equipment, HOB elevated Sit to supine: Total assist, +2 for physical assistance, +2 for safety/equipment, HOB elevated   General bed mobility comments: Pt needing >5 minutes and step-by-step cues and AAROM to slide each leg towards and off L EOB, one by one. Pt needing total assist x2 to pivot hips using bed pad and ascend trunk to sit up EOB with support under her mid back due to pain at shoulders. Total assist x2 to transition back to supine.    Transfers                   General transfer comment: Unable due to pain today    Ambulation/Gait               General Gait Details: unable at this time  Stairs            Wheelchair Mobility    Modified Rankin (Stroke Patients Only)       Balance Overall balance assessment: Needs assistance Sitting-balance support: Single extremity supported, No upper extremity supported, Feet supported Sitting balance-Leahy Scale: Fair Sitting balance - Comments: Able to sit statically with  min guard-minA for balance, limited by pain       Standing balance comment: deferred                             Pertinent Vitals/Pain Pain Assessment Pain Assessment: Faces Faces Pain Scale: Hurts whole lot Pain Location: Bil shoulder, bil legs, groin,  generalized Pain Descriptors / Indicators: Discomfort, Grimacing, Guarding, Moaning, Crying Pain Intervention(s): Limited activity within patient's tolerance, Monitored during session, Repositioned, Patient requesting pain meds-RN notified    Home Living Family/patient expects to be discharged to:: Private residence Living Arrangements: Children Available Help at Discharge: Family;Available PRN/intermittently Type of Home: House Home Access: Ramped entrance       Home Layout: Able to live on main level with bedroom/bathroom;Two level Home Equipment: Rollator (4 wheels);Shower seat Additional Comments: sleeps in recliner    Prior Function Prior Level of Function : Needs assist       Physical Assist : ADLs (physical)     Mobility Comments: Mod I using rollator, no falls in past 6 months at least ADLs Comments: Pt has limited bil shoulder AROM, thus daughter assists with lower body ADLs and cleaning face/hair and with grooming; pt was mod I using the bathroom and would assist with upper body ADLs     Hand Dominance   Dominant Hand: Right    Extremity/Trunk Assessment   Upper Extremity Assessment Upper Extremity Assessment: RUE deficits/detail;LUE deficits/detail RUE Deficits / Details: very limited ROM at shoulders, globally weak but full ROM of ebow, wrist and hands RUE Coordination: decreased gross motor LUE Deficits / Details: very limited ROM at shoulders, globally weak but full ROM of ebow, wrist and hands LUE Coordination: decreased gross motor    Lower Extremity Assessment Lower Extremity Assessment: Defer to PT evaluation RLE Deficits / Details: purple discoloration of foot/toes with gravity dependent position (baseline per pt); achieves about neutral ankle dorsiflexion AAROM but rests in inversion; generalized weakness, able to extend knee against gravity better than her L but still very weak, likely MMT score < 3+ LLE Deficits / Details: erythema noted in lower  leg; achieves about -5 degrees ankle dorsiflexion AAROM but rests in inversion; generalized weakness, able to extend knee against gravity worse on her L than her R, likely MMT score < 3    Cervical / Trunk Assessment Cervical / Trunk Assessment: Kyphotic  Communication   Communication: No difficulties  Cognition Arousal/Alertness: Awake/alert Behavior During Therapy: Anxious, WFL for tasks assessed/performed Overall Cognitive Status: Within Functional Limits for tasks assessed                                 General Comments: Pt anxious in regards to pain but overall pleasant and appreciative. Follows commands appropriately, but again limited in initiation by pain.        General Comments General comments (skin integrity, edema, etc.): educated pt and family on rolling schedule and floating heels to prevent pressure wounds - notified RN of plan also    Exercises     Assessment/Plan    PT Assessment Patient needs continued PT services  PT Problem List Decreased strength;Decreased activity tolerance;Decreased range of motion;Decreased balance;Decreased mobility;Pain       PT Treatment Interventions DME instruction;Gait training;Functional mobility training;Therapeutic activities;Balance training;Therapeutic exercise;Neuromuscular re-education;Patient/family education    PT Goals (Current goals can be found in the Care Plan section)  Acute Rehab  PT Goals Patient Stated Goal: to reduce pain PT Goal Formulation: With patient/family Time For Goal Achievement: 11/04/22 Potential to Achieve Goals: Good    Frequency Min 1X/week     Co-evaluation PT/OT/SLP Co-Evaluation/Treatment: Yes Reason for Co-Treatment: For patient/therapist safety;To address functional/ADL transfers PT goals addressed during session: Mobility/safety with mobility;Balance OT goals addressed during session: ADL's and self-care       AM-PAC PT "6 Clicks" Mobility  Outcome Measure Help  needed turning from your back to your side while in a flat bed without using bedrails?: Total Help needed moving from lying on your back to sitting on the side of a flat bed without using bedrails?: Total Help needed moving to and from a bed to a chair (including a wheelchair)?: Total Help needed standing up from a chair using your arms (e.g., wheelchair or bedside chair)?: Total Help needed to walk in hospital room?: Total Help needed climbing 3-5 steps with a railing? : Total 6 Click Score: 6    End of Session   Activity Tolerance: Patient limited by pain Patient left: in bed;with call bell/phone within reach;with bed alarm set;with family/visitor present;Other (comment) (with heels floating and arms supported and elevated) Nurse Communication: Mobility status;Patient requests pain meds;Other (comment) (family asking about fluid pills; rolling schedule) PT Visit Diagnosis: Muscle weakness (generalized) (M62.81);Difficulty in walking, not elsewhere classified (R26.2);Pain Pain - Right/Left:  (bil) Pain - part of body: Shoulder;Leg    Time: 1610-9604 PT Time Calculation (min) (ACUTE ONLY): 35 min   Charges:   PT Evaluation $PT Eval Moderate Complexity: 1 Mod          Raymond Gurney, PT, DPT Acute Rehabilitation Services  Office: [phone]   Jewel Baize 10/21/2022, 2:30 PM

## 2022-10-21 NOTE — Progress Notes (Signed)
HR 170s. Asymptomatic. MD notified. Patient stated no pain. Currently resting in bed with daughter present. Obtaining EKG. Metoprolol PO given. Awaiting pharmacy approval for metoprolol IV.  Kenard Gower, RN

## 2022-10-21 NOTE — Plan of Care (Signed)

## 2022-10-22 DIAGNOSIS — J189 Pneumonia, unspecified organism: Secondary | ICD-10-CM | POA: Diagnosis not present

## 2022-10-22 LAB — BLOOD CULTURE ID PANEL (REFLEXED) - BCID2

## 2022-10-22 LAB — CBC WITH DIFFERENTIAL/PLATELET
Abs Immature Granulocytes: 0 10*3/uL (ref 0.00–0.07)
Basophils Absolute: 0 10*3/uL (ref 0.0–0.1)
Basophils Relative: 0 %
Eosinophils Absolute: 0 10*3/uL (ref 0.0–0.5)
Eosinophils Relative: 0 %
HCT: 23.1 % — ABNORMAL LOW (ref 36.0–46.0)
Hemoglobin: 8.1 g/dL — ABNORMAL LOW (ref 12.0–15.0)
Lymphocytes Relative: 4 %
Lymphs Abs: 0.5 10*3/uL — ABNORMAL LOW (ref 0.7–4.0)
MCH: 30.1 pg (ref 26.0–34.0)
MCHC: 35.1 g/dL (ref 30.0–36.0)
MCV: 85.9 fL (ref 80.0–100.0)
Monocytes Absolute: 1.2 10*3/uL — ABNORMAL HIGH (ref 0.1–1.0)
Monocytes Relative: 9 %
Neutro Abs: 11.7 10*3/uL — ABNORMAL HIGH (ref 1.7–7.7)
Neutrophils Relative %: 87 %
Platelets: 135 10*3/uL — ABNORMAL LOW (ref 150–400)
RBC: 2.69 MIL/uL — ABNORMAL LOW (ref 3.87–5.11)
RDW: 14.7 % (ref 11.5–15.5)
WBC: 13.5 10*3/uL — ABNORMAL HIGH (ref 4.0–10.5)
nRBC: 0 /100 WBC
nRBC: 0.3 % — ABNORMAL HIGH (ref 0.0–0.2)

## 2022-10-22 LAB — URINALYSIS, ROUTINE W REFLEX MICROSCOPIC
Bacteria, UA: NONE SEEN
Bilirubin Urine: NEGATIVE
Glucose, UA: NEGATIVE mg/dL
Hgb urine dipstick: NEGATIVE
Ketones, ur: NEGATIVE mg/dL
Nitrite: NEGATIVE
Protein, ur: 30 mg/dL — AB
Specific Gravity, Urine: 1.019 (ref 1.005–1.030)
pH: 6 (ref 5.0–8.0)

## 2022-10-22 LAB — BASIC METABOLIC PANEL
Anion gap: 12 (ref 5–15)
BUN: 51 mg/dL — ABNORMAL HIGH (ref 8–23)
CO2: 18 mmol/L — ABNORMAL LOW (ref 22–32)
Calcium: 7.4 mg/dL — ABNORMAL LOW (ref 8.9–10.3)
Chloride: 95 mmol/L — ABNORMAL LOW (ref 98–111)
Creatinine, Ser: 1.31 mg/dL — ABNORMAL HIGH (ref 0.44–1.00)
GFR, Estimated: 40 mL/min — ABNORMAL LOW (ref >60)
Glucose, Bld: 107 mg/dL — ABNORMAL HIGH (ref 70–99)
Potassium: 3.6 mmol/L (ref 3.5–5.1)
Sodium: 125 mmol/L — ABNORMAL LOW (ref 135–145)

## 2022-10-22 LAB — CULTURE, BLOOD (ROUTINE X 2)

## 2022-10-22 LAB — OSMOLALITY: Osmolality: 279 mOsm/kg (ref 275–295)

## 2022-10-22 MED ORDER — ALBUTEROL SULFATE (2.5 MG/3ML) 0.083% IN NEBU: 2.0000 mg | INHALATION_SOLUTION | Freq: Four times a day (QID) | RESPIRATORY_TRACT | Status: DC

## 2022-10-22 MED ORDER — ALBUTEROL SULFATE (2.5 MG/3ML) 0.083% IN NEBU: 2.0000 mg | INHALATION_SOLUTION | Freq: Four times a day (QID) | RESPIRATORY_TRACT | Status: DC | PRN

## 2022-10-22 MED ORDER — ALBUTEROL SULFATE (2.5 MG/3ML) 0.083% IN NEBU
2.0000 mg | INHALATION_SOLUTION | Freq: Four times a day (QID) | RESPIRATORY_TRACT | Status: DC
  Filled 2022-10-22: qty 3

## 2022-10-22 MED ORDER — BISACODYL 5 MG PO TBEC
5.0000 mg | DELAYED_RELEASE_TABLET | Freq: Every day | ORAL | Status: DC | PRN
  Administered 2022-10-23: 5 mg via ORAL
  Filled 2022-10-22: qty 1

## 2022-10-22 MED ORDER — BISACODYL 10 MG RE SUPP
10.0000 mg | Freq: Once | RECTAL | Status: DC
  Filled 2022-10-22: qty 1

## 2022-10-22 MED ORDER — METOPROLOL TARTRATE 25 MG PO TABS
25.0000 mg | ORAL_TABLET | Freq: Every day | ORAL | Status: DC
  Administered 2022-10-22 – 2022-10-25 (×4): 25 mg via ORAL
  Filled 2022-10-22 (×4): qty 1

## 2022-10-22 NOTE — Progress Notes (Addendum)
RE: Kathleen Paul  Date of Birth: [birth year]  Date: 10/22/2022  To Whom It May Concern:  Please be advised that the above-named patient will require a short-term nursing home stay - anticipated 30 days or less for rehabilitation and strengthening. The plan is for return home.

## 2022-10-22 NOTE — Progress Notes (Signed)
PROGRESS NOTE    Kathleen Paul  ZOX:[id number] DOB: [birth year] DOA: 10/20/2022 PCP: Doreene Nest, NP   Brief Narrative:  HPI: Kathleen Paul is a 87 y.o. female with medical history significant of TIA, CAD, CKD 3A, hypertension, hyperlipidemia, GERD, obesity, hypothyroidism, anxiety, DVT presenting with generalized weakness, cough, abdominal pain.   Patient reports 2 weeks of feeling generally unwell and has had associated nonproductive cough and worsening generalized weakness.  She has reported some pain at her right inguinal fold as well and has history of hernia on the left.  Has been able to pass stool, but last BM was 2 days ago in the setting of decreased p.o. intake..   She did see her PCP for phone visit/virtual visit was prescribed a course of prednisone about a week ago, which she has since completed and then prescribed azithromycin after she failed to improve on the prednisone.  She states that she has had trouble sleeping while she has been on the prednisone has had felt worse in that time period as well.   She additionally reports generalized pain as well as some specifically to her left shoulder and left knee; she reports this is worse after her ambulance ride and she is prone to pain due to some chronic arthritis issues. She she additionally has reported some light sensitivity but no stiffness in the neck nor significant headache; But has had some headache on and off in the setting of decreased p.o. intake.   She developed left lower extremity redness just this morning/overnight per family.   She denies fevers (but reports some readings around 99), chills, chest pain, shortness of breath, constipation, diarrhea, nausea, vomiting.   ED Course: Vital signs in the ED notable for heart rate in the 100s to 130s, blood pressure in the 130s to 160s systolic.  Lab workup included BMP with sodium 125, chloride 91, BUN 61, creatinine 1.31 which is stable, glucose 64, calcium 8.4.   CBC with hemoglobin stable at 10.4 and leukocytosis to 21.4.  Troponin mildly elevated on first check at 43 with repeat pending.  D-dimer elevated to 8.6.  Lactic acid normal.  COVID screen negative.  Urinalysis and blood cultures pending.  Chest x-ray showing ill-defined left middle lobe/left lower lobe opacity.  CTA PE study was negative for PE but did show left lower lobe changes and opacity consistent with pneumonia.  CTA abdomen pelvis showed diverticulosis without diverticulitis, left inguinal hernia with nonspecific fat stranding, levoscoliosis and osteoarthritis.  Magnesium level checked as well and showed to be low at 1.6.  Patient received Dilaudid, ceftriaxone, doxycycline, 2 L of IV fluids in the ED.  Assessment & Plan:   Principal Problem:   CAP (community acquired pneumonia) Active Problems:   TIA (transient ischemic attack)   Hypertension   Dyslipidemia   CAD (coronary artery disease)   Hypothyroidism   Gastroesophageal reflux disease   Chronic kidney disease, stage 3a   GAD (generalized anxiety disorder)   Cellulitis   Hyponatremia   Sepsis  Sepsis secondary to community-acquired pneumonia, POA: Meets criteria for sepsis based on tachycardia and leukocytosis with a source as pneumonia and left lower extremity cellulitis.  Comes in with cough, does have evidence of pneumonia on imaging and leukocytosis (but did complete a course of steroids outpatient). CTA negative for PE.  She is not hypoxic.  Per pharmacy, blood cultures growing H. influenzae.  She is on the correct antibiotic/Rocephin which we will continue for 7 days.  Patient clinically  improving and leukocytosis improved as well and she is afebrile.  Some wheezes today, will order Xopenex.  Left lower extremity cellulitis: No reports of trauma.  Erythema improving.  Continue Rocephin.   Acute hyponatremia: Presented with 125, given normal saline in the beginning.  Labs argue against SIADH.  Will monitor for now.  She is  asymptomatic.  Currently 125.  Hypomagnesemia: Replaced and resolved.  CKD 3a: At baseline.  Right inguinal pain / Generalized pain > Has had some ongoing generalized pain, Additionally reports specific right inguinal pain. Nothing on imaging to indicate abnormality at the right inguinal area.  Unclear if this could be related to her neurogenic source considering her levo scoliosis noted on CT. Pain control with Tylenol for mild pain.  She tells me that she was having hard time swallowing the pill/Norco.  Would prefer liquid, she tells me that she got liquid Dilaudid yesterday which helped.  I will start her on liquid Dilaudid and continue and discontinue IV Dilaudid.   Essential hypertension -Pressure very well-controlled, continue home enalapril, will increase dose of metoprolol metoprolol.    History of TIA > Last year had 3-hour episode of aphasia and diagnosed with TIA.  Noted to have severe vertebral artery stenosis on the right but not intervened upon.  Completed 3 months of Plavix now on aspirin alone. - Continue home aspirin, simvastatin   CAD Hyperlipidemia - Continue home simvastatin   Hypothyroidism - Continue on Synthroid   Obesity - Noted  Generalized weakness and bilateral chronic shoulder pain secondary to severe arthritis: Patient and daughters tell me that she has chronic severe pain in bilateral shoulders due to severe osteoarthritis but this has been worse lately.  She has been told that surgery is not an option for her since this is advanced arthritis.  Seen by PT OT, SNF recommended.  TOC consulted.  SVT: Patient had an episode of SVT on the afternoon of 10/21/2022.  She was given IV Lopressor.  SVT resolved.  I noticed that she is only on 50 mg of Lopressor once daily.  I will increase that to twice daily to prevent further episodes.  DVT prophylaxis: enoxaparin (LOVENOX) injection 30 mg Start: 10/20/22 1400   Code Status: DNR  Family Communication: Daughter and  son present at the bedside.  Plan of care discussed with them in length.  They verbalized understanding.    Status is: Inpatient Remains inpatient appropriate because: Medically stable.  Pending placement.     Estimated body mass index is 29.29 kg/m as calculated from the following:   Height as of this encounter: 4\' 11"  (1.499 m).   Weight as of 10/13/22: 65.8 kg.    Nutritional Assessment: Body mass index is 29.29 kg/m.Marland Kitchen Seen by dietician.  I agree with the assessment and plan as outlined below: Nutrition Status:        . Skin Assessment: I have examined the patient's skin and I agree with the wound assessment as performed by the wound care RN as outlined below:    Consultants:  None  Procedures:  None  Antimicrobials:  Anti-infectives (From admission, onward)    Start     Dose/Rate Route Frequency Ordered Stop   10/21/22 1800  doxycycline (VIBRA-TABS) tablet 100 mg        100 mg Oral Every 12 hours 10/21/22 1236     10/21/22 1000  cefTRIAXone (ROCEPHIN) 2 g in sodium chloride 0.9 % 100 mL IVPB        2 g  200 mL/hr over 30 Minutes Intravenous Every 24 hours 10/20/22 1358 10/27/22 0959   10/21/22 0700  doxycycline (VIBRAMYCIN) 100 mg in sodium chloride 0.9 % 250 mL IVPB  Status:  Discontinued        100 mg 125 mL/hr over 120 Minutes Intravenous 2 times daily 10/21/22 0615 10/21/22 1236   10/20/22 1030  cefTRIAXone (ROCEPHIN) 2 g in sodium chloride 0.9 % 100 mL IVPB        2 g 200 mL/hr over 30 Minutes Intravenous  Once 10/20/22 1020 10/20/22 1157   10/20/22 1030  doxycycline (VIBRAMYCIN) 100 mg in sodium chloride 0.9 % 250 mL IVPB        100 mg 125 mL/hr over 120 Minutes Intravenous  Once 10/20/22 1020 10/20/22 1444         Subjective: Patient and examined.  Continues to complain of generalized weakness and severe pain in bilateral shoulder which is chronic.  No other complaint.  Denies shortness of breath.  Objective: Vitals:   10/21/22 2007 10/21/22 2326  10/22/22 0418 10/22/22 0856  BP: 139/67 (!) 115/56 (!) 144/75 (!) 153/63  Pulse: 88 96 88 97  Resp: 16 18 16 17   Temp: (!) 97.5 F (36.4 C) 98 F (36.7 C) 98.2 F (36.8 C) 98.4 F (36.9 C)  TempSrc: Oral Oral Oral Oral  SpO2: 99% 96% 99% 100%  Height:        Intake/Output Summary (Last 24 hours) at 10/22/2022 1141 Last data filed at 10/22/2022 0300 Gross per 24 hour  Intake 700 ml  Output 1250 ml  Net -550 ml    There were no vitals filed for this visit.  Examination: General exam: Appears calm and comfortable  Respiratory system: Rhonchi bilaterally.  Intermittent end expiratory wheezes.  Respiratory effort normal. Cardiovascular system: S1 & S2 heard, RRR. No JVD, murmurs, rubs, gallops or clicks. No pedal edema. Gastrointestinal system: Abdomen is nondistended, soft and nontender. No organomegaly or masses felt. Normal bowel sounds heard. Central nervous system: Alert and oriented. No focal neurological deficits. Extremities: Symmetric 5 x 5 power. Skin: No rashes, lesions or ulcers.  Psychiatry: Judgement and insight appear normal. Mood & affect appropriate.    Data Reviewed: I have personally reviewed following labs and imaging studies  CBC: Recent Labs  Lab 10/20/22 0927 10/21/22 0638 10/22/22 0146  WBC 21.4* 15.3* 13.5*  NEUTROABS 20.1*  --  11.7*  HGB 10.4* 8.6* 8.1*  HCT 30.2* 24.5* 23.1*  MCV 88.6 86.9 85.9  PLT 201 154 135*    Basic Metabolic Panel: Recent Labs  Lab 10/20/22 0927 10/20/22 1812 10/20/22 2217 10/21/22 0211 10/21/22 0638 10/22/22 0146  NA 125* 127* 127* 127* 126* 125*  K 4.2 3.9 3.8 3.8 3.9 3.6  CL 91* 94* 95* 94* 94* 95*  CO2 22 17* 18* 20* 18* 18*  GLUCOSE 64* 61* 113* 100* 81 107*  BUN 61* 46* 50* 50* 53* 51*  CREATININE 1.31* 1.17* 1.14* 1.20* 1.29* 1.31*  CALCIUM 8.4* 7.7* 7.9* 7.8* 7.7* 7.4*  MG 1.6*  --   --   --  2.1  --     GFR: Estimated Creatinine Clearance: 26.3 mL/min (A) (by C-G formula based on SCr of 1.31  mg/dL (H)). Liver Function Tests: No results for input(s): "AST", "ALT", "ALKPHOS", "BILITOT", "PROT", "ALBUMIN" in the last 168 hours. No results for input(s): "LIPASE", "AMYLASE" in the last 168 hours. No results for input(s): "AMMONIA" in the last 168 hours. Coagulation Profile: No results for input(s): "  INR", "PROTIME" in the last 168 hours. Cardiac Enzymes: No results for input(s): "CKTOTAL", "CKMB", "CKMBINDEX", "TROPONINI" in the last 168 hours. BNP (last 3 results) No results for input(s): "PROBNP" in the last 8760 hours. HbA1C: No results for input(s): "HGBA1C" in the last 72 hours. CBG: Recent Labs  Lab 10/21/22 1145  GLUCAP 97   Lipid Profile: No results for input(s): "CHOL", "HDL", "LDLCALC", "TRIG", "CHOLHDL", "LDLDIRECT" in the last 72 hours. Thyroid Function Tests: No results for input(s): "TSH", "T4TOTAL", "FREET4", "T3FREE", "THYROIDAB" in the last 72 hours. Anemia Panel: No results for input(s): "VITAMINB12", "FOLATE", "FERRITIN", "TIBC", "IRON", "RETICCTPCT" in the last 72 hours. Sepsis Labs: Recent Labs  Lab 10/20/22 1045 10/20/22 1812 10/21/22 0638  PROCALCITON  --  7.98 7.41  LATICACIDVEN 1.6  --   --      Recent Results (from the past 240 hour(s))  SARS Coronavirus 2 by RT PCR (hospital order, performed in [facility] hospital lab) *cepheid single result test* Anterior Nasal Swab     Status: None   Collection Time: 10/20/22  9:21 AM   Specimen: Anterior Nasal Swab  Result Value Ref Range Status   SARS Coronavirus 2 by RT PCR NEGATIVE NEGATIVE Final    Comment: Performed at [facility], [address], [place], [place] [address]  Blood culture (routine x 2)     Status: None (Preliminary result)   Collection Time: 10/20/22 10:45 AM   Specimen: BLOOD RIGHT ARM  Result Value Ref Range Status   Specimen Description BLOOD RIGHT ARM  Final   Special Requests   Final    BOTTLES DRAWN AEROBIC AND ANAEROBIC Blood Culture adequate volume   Culture   Setup Time   Final    GRAM NEGATIVE RODS AEROBIC BOTTLE ONLY CRITICAL RESULT CALLED TO, READ BACK BY AND VERIFIED WITH: Chana Bode [id number] FCP Performed at [facility], [address] [place], [place] [address]    Culture GRAM NEGATIVE RODS  Final   Report Status PENDING  Incomplete  Blood culture (routine x 2)     Status: None (Preliminary result)   Collection Time: 10/20/22  6:09 PM   Specimen: BLOOD  Result Value Ref Range Status   Specimen Description BLOOD SITE NOT SPECIFIED  Final   Special Requests   Final    BOTTLES DRAWN AEROBIC ONLY Blood Culture adequate volume   Culture   Final    NO GROWTH 2 DAYS Performed at [facility], [address], [place], [place] [address]    Report Status PENDING  Incomplete     Radiology Studies: VAS [place] LOWER EXTREMITY VENOUS (DVT) (ONLY [facility] & [facility])  Result Date: 10/21/2022  Lower Venous DVT Study Patient Name:  AILINE HEFFERAN  Date of Exam:   10/20/2022 Medical Rec #: [id number]        Accession #:    [id number] Date of Birth: [birth year]         Patient Gender: F Patient Age:   16 years Exam Location:  [facility] Procedure:      VAS [place] LOWER EXTREMITY VENOUS (DVT) Referring Phys: MATTHEW TRIFAN --------------------------------------------------------------------------------  Indications: Pain in bilateral lower extremities- right groin, left knee, new erythema of left lower extremity x1 day. Other Indications: Remote history of DVT. Limitations: Poor ultrasound/tissue interface and patient pain/sensitivity to compressions. Comparison Study: No prior studies available. Performing Technologist: Jean Rosenthal RDMS, RVT  Examination Guidelines: A complete evaluation includes B-mode imaging, spectral Doppler,  color Doppler, and power Doppler as needed of all accessible portions of each vessel. Bilateral testing is considered an integral part of a complete examination. Limited examinations for reoccurring indications may be  performed as noted. The reflux portion of the exam is performed with the patient in reverse Trendelenburg.  +---------+---------------+---------+-----------+----------+-------------------+ RIGHT    CompressibilityPhasicitySpontaneityPropertiesThrombus Aging      +---------+---------------+---------+-----------+----------+-------------------+ CFV      Full           Yes      Yes                                      +---------+---------------+---------+-----------+----------+-------------------+ SFJ      Full                                                             +---------+---------------+---------+-----------+----------+-------------------+ FV Prox  Full                                                             +---------+---------------+---------+-----------+----------+-------------------+ FV Mid   Full           Yes      Yes                                      +---------+---------------+---------+-----------+----------+-------------------+ FV DistalFull           Yes      Yes                                      +---------+---------------+---------+-----------+----------+-------------------+ PFV      Full                                                             +---------+---------------+---------+-----------+----------+-------------------+ POP      Full           Yes      Yes                                      +---------+---------------+---------+-----------+----------+-------------------+ PTV      Full                                                             +---------+---------------+---------+-----------+----------+-------------------+ PERO  Not well visualized +---------+---------------+---------+-----------+----------+-------------------+   +---------+---------------+---------+-----------+---------------+--------------+ LEFT      CompressibilityPhasicitySpontaneityProperties     Thrombus Aging +---------+---------------+---------+-----------+---------------+--------------+ CFV      Full           Yes      Yes                                      +---------+---------------+---------+-----------+---------------+--------------+ SFJ      Full                                                             +---------+---------------+---------+-----------+---------------+--------------+ FV Prox  Full                                                             +---------+---------------+---------+-----------+---------------+--------------+ FV Mid                  Yes      Yes        Patent by color               +---------+---------------+---------+-----------+---------------+--------------+ FV Distal               Yes      Yes        Patent by color               +---------+---------------+---------+-----------+---------------+--------------+ PFV      Full                                                             +---------+---------------+---------+-----------+---------------+--------------+ POP      Full           Yes      Yes                                      +---------+---------------+---------+-----------+---------------+--------------+ PTV      Full                                                             +---------+---------------+---------+-----------+---------------+--------------+ PERO     Full                                              Some segments  not well                                                                  visualized     +---------+---------------+---------+-----------+---------------+--------------+     Summary: RIGHT: - There is no evidence of deep vein thrombosis in the lower extremity. However, portions of this examination were limited- see technologist comments above.   - No cystic structure found in the popliteal fossa.  LEFT: - There is no evidence of deep vein thrombosis in the lower extremity. However, portions of this examination were limited- see technologist comments above.  - No cystic structure found in the popliteal fossa.  *See table(s) above for measurements and observations. Electronically signed by Gerarda Fraction on 10/21/2022 at 9:13:51 AM.    Final    CT ABDOMEN PELVIS W CONTRAST  Result Date: 10/20/2022 CLINICAL DATA:  Left lung opacity, inguinal hernia with worsening pain. EXAM: CT ANGIOGRAPHY CHEST CT ABDOMEN AND PELVIS WITH CONTRAST TECHNIQUE: Multidetector CT imaging of the chest was performed using the standard protocol during bolus administration of intravenous contrast. Multiplanar CT image reconstructions and MIPs were obtained to evaluate the vascular anatomy. Multidetector CT imaging of the abdomen and pelvis was performed using the standard protocol during bolus administration of intravenous contrast. RADIATION DOSE REDUCTION: This exam was performed according to the departmental dose-optimization program which includes automated exposure control, adjustment of the mA and/or kV according to patient size and/or use of iterative reconstruction technique. CONTRAST:  60mL OMNIPAQUE IOHEXOL 350 MG/ML SOLN COMPARISON:  Chest radiograph performed earlier on the same date. FINDINGS: CTA CHEST FINDINGS Cardiovascular: Satisfactory opacification of the pulmonary arteries to the segmental level. No evidence of pulmonary embolism. Heart is normal in size. Coronary artery atherosclerotic calcifications. Mediastinum/Nodes: No enlarged mediastinal, hilar, or axillary lymph nodes. Thyroid and trachea are within normal limits. Large hiatal hernia with partial intrathoracic stomach. Lungs/Pleura: Lower lobe opacity with air bronchograms concerning for pneumonia. Right basilar subsegmental linear atelectasis and small right pleural effusion. Musculoskeletal: Thoracic  kyphoscoliosis with multilevel degenerate disc disease. No acute chest wall abnormality. Moderate bilateral glenohumeral osteoarthritis. Review of the MIP images confirms the above findings. CT ABDOMEN and PELVIS FINDINGS Hepatobiliary: No focal liver abnormality is seen. No gallstones, gallbladder wall thickening, or biliary dilatation. Pancreas: Unremarkable. No pancreatic ductal dilatation or surrounding inflammatory changes. Spleen: Normal in size without focal abnormality. Adrenals/Urinary Tract: Adrenal glands are unremarkable. Kidneys are normal, without renal calculi, focal lesion, or hydronephrosis. Bladder is unremarkable. Multiple bilateral small renal cortical cysts, likely benign process. Stomach/Bowel: Moderate size hiatal hernia with partially intrathoracic stomach without evidence of obstruction. Small bowel loops are normal in caliber. Appendix not identified, however no inflammatory changes in the pericecal region. Sigmoid colonic diverticulosis without evidence of acute diverticulitis. Vascular/Lymphatic: Moderate aortic atherosclerotic calcification of aorta and branch vessels. No enlarged abdominal or pelvic lymph nodes. Reproductive: Status post hysterectomy. No adnexal masses. Other: Fat containing left inguinal hernia without evidence of obstruction. There is also fat containing umbilical hernia. Nonspecific fat stranding in the left inguinal region subcutaneous soft tissues. Musculoskeletal: Moderate-to-severe levoscoliosis of the lumbar spine centered at L4 vertebral body with partial left lateral subluxation of the L3. 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 Advanced  right hip osteoarthritis with near complete loss of the articular cartilage and subchondral cystic changes. Review of the MIP images confirms the above findings. IMPRESSION: CT angio chest: 1. No evidence of pulmonary embolism. 2. Left lower lobe  opacity with air bronchograms concerning for pneumonia. Follow-up examination to resolution is recommended. 3. Large hiatal hernia with partial intrathoracic stomach. CT abdomen/pelvis: 1. Sigmoid colonic diverticulosis without evidence of acute diverticulitis. 2. Fat containing left inguinal hernia without evidence of obstruction. 3. Nonspecific left inguinal region fat stranding without evidence of fluid collection or abscess. Correlate with physical examination findings. 4. Moderate-to-severe levoscoliosis of the lumbar spine centered at L3 vertebral body with partial left lateral subluxation of the L4 vertebral body. 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 5. Advanced right hip osteoarthritis with near complete loss of the articular cartilage and subchondral cystic changes. 6. Aortic Atherosclerosis (ICD10-I70.0). Electronically Signed   By: Larose Hires D.O.   On: 10/20/2022 12:52   CT Angio Chest PE W and/or Wo Contrast  Result Date: 10/20/2022 CLINICAL DATA:  Left lung opacity, inguinal hernia with worsening pain. EXAM: CT ANGIOGRAPHY CHEST CT ABDOMEN AND PELVIS WITH CONTRAST TECHNIQUE: Multidetector CT imaging of the chest was performed using the standard protocol during bolus administration of intravenous contrast. Multiplanar CT image reconstructions and MIPs were obtained to evaluate the vascular anatomy. Multidetector CT imaging of the abdomen and pelvis was performed using the standard protocol during bolus administration of intravenous contrast. RADIATION DOSE REDUCTION: This exam was performed according to the departmental dose-optimization program which includes automated exposure control, adjustment of the mA and/or kV according to patient size and/or use of iterative reconstruction technique. CONTRAST:  60mL OMNIPAQUE IOHEXOL 350 MG/ML SOLN COMPARISON:  Chest radiograph performed earlier on the same date. FINDINGS: CTA  CHEST FINDINGS Cardiovascular: Satisfactory opacification of the pulmonary arteries to the segmental level. No evidence of pulmonary embolism. Heart is normal in size. Coronary artery atherosclerotic calcifications. Mediastinum/Nodes: No enlarged mediastinal, hilar, or axillary lymph nodes. Thyroid and trachea are within normal limits. Large hiatal hernia with partial intrathoracic stomach. Lungs/Pleura: Lower lobe opacity with air bronchograms concerning for pneumonia. Right basilar subsegmental linear atelectasis and small right pleural effusion. Musculoskeletal: Thoracic kyphoscoliosis with multilevel degenerate disc disease. No acute chest wall abnormality. Moderate bilateral glenohumeral osteoarthritis. Review of the MIP images confirms the above findings. CT ABDOMEN and PELVIS FINDINGS Hepatobiliary: No focal liver abnormality is seen. No gallstones, gallbladder wall thickening, or biliary dilatation. Pancreas: Unremarkable. No pancreatic ductal dilatation or surrounding inflammatory changes. Spleen: Normal in size without focal abnormality. Adrenals/Urinary Tract: Adrenal glands are unremarkable. Kidneys are normal, without renal calculi, focal lesion, or hydronephrosis. Bladder is unremarkable. Multiple bilateral small renal cortical cysts, likely benign process. Stomach/Bowel: Moderate size hiatal hernia with partially intrathoracic stomach without evidence of obstruction. Small bowel loops are normal in caliber. Appendix not identified, however no inflammatory changes in the pericecal region. Sigmoid colonic diverticulosis without evidence of acute diverticulitis. Vascular/Lymphatic: Moderate aortic atherosclerotic calcification of aorta and branch vessels. No enlarged abdominal or pelvic lymph nodes. Reproductive: Status post hysterectomy. No adnexal masses. Other: Fat containing left inguinal hernia without evidence of obstruction. There is also fat containing umbilical hernia. Nonspecific fat stranding  in the left inguinal region subcutaneous soft tissues. Musculoskeletal: Moderate-to-severe levoscoliosis of the lumbar spine centered at L4 vertebral body with partial left lateral subluxation of the L3. 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 Advanced right hip osteoarthritis with near complete loss of the articular cartilage and subchondral cystic changes. Review of the MIP images confirms the above findings. IMPRESSION: CT angio chest: 1. No evidence of pulmonary embolism. 2. Left lower lobe opacity with air bronchograms concerning for pneumonia. Follow-up examination to resolution is recommended. 3. Large hiatal hernia with partial intrathoracic stomach. CT abdomen/pelvis: 1. Sigmoid colonic diverticulosis without evidence of acute diverticulitis. 2. Fat containing left inguinal hernia without evidence of obstruction. 3. Nonspecific left inguinal region fat stranding without evidence of fluid collection or abscess. Correlate with physical examination findings. 4. Moderate-to-severe levoscoliosis of the lumbar spine centered at L3 vertebral body with partial left lateral subluxation of the L4 vertebral body. 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 5. Advanced right hip osteoarthritis with near complete loss of the articular cartilage and subchondral cystic changes. 6. Aortic Atherosclerosis (ICD10-I70.0). Electronically Signed   By: Larose Hires D.O.   On: 10/20/2022 12:52    Scheduled Meds:  aspirin EC  81 mg Oral Daily   doxycycline  100 mg Oral Q12H   enalapril  10 mg Oral Daily   enoxaparin (LOVENOX) injection  30 mg Subcutaneous Q24H   hydrochlorothiazide  25 mg Oral Daily   levothyroxine  88 mcg Oral Q0600   metoprolol tartrate  25 mg Oral QHS   simvastatin  20 mg Oral QPM   sodium chloride flush  3 mL Intravenous Q12H   Continuous Infusions:   cefTRIAXone (ROCEPHIN)  IV 2 g (10/22/22 0933)     LOS: 1 day   Hughie Closs, MD [facility]  10/22/2022, 11:41 AM   *Please note that this is a verbal dictation therefore any spelling or grammatical errors are due to the "Dragon Medical One" system interpretation.  Please page via Amion and do not message via secure chat for urgent patient care matters. Secure chat can be used for non urgent patient care matters.  How to contact the [facility] Attending or Consulting provider 7A - 7P or covering provider during after hours 7P -7A, for this patient?  Check the care team in [facility] and look for a) attending/consulting TRH provider listed and b) the [facility] team listed. Page or secure chat 7A-7P. Log into [email or web address] and use [facility]'s universal password to access. If you do not have the password, please contact the hospital operator. Locate the [facility] provider you are looking for under [facility] and page to a number that you can be directly reached. If you still have difficulty reaching the provider, please page the [facility] (Director on Call) for the Hospitalists listed on amion for assistance.

## 2022-10-22 NOTE — Progress Notes (Signed)
PHARMACY - PHYSICIAN COMMUNICATION CRITICAL VALUE ALERT - BLOOD CULTURE IDENTIFICATION (BCID)  Kathleen Paul is an 87 y.o. female who presented to [facility] on 10/20/2022 with a chief complaint of general unwellness  Assessment:  1/3 Bcx with Haemophilus influenza   Name of physician (or Provider) Contacted: Pahwani  Current antibiotics: Ceftriaxone 2g IV q 24h  Changes to prescribed antibiotics recommended:  Agent appropriate. LOT 4 days entered currently, will adjust to 7 days.   No results found for this or any previous visit.  Calton Dach, PharmD Clinical Pharmacist 10/22/2022 11:14 AM

## 2022-10-22 NOTE — NC FL2 (Signed)
MEDICAID FL2 LEVEL OF CARE FORM     IDENTIFICATION  Patient Name: Kathleen Paul Birthdate: 07/10/1936 Sex: female Admission Date (Current Location): 10/20/2022  [facility] and [place] Number:  Producer, television/film/video and Address:  [facility], [address], [place], [place] [address]      Provider Number: [id number]  Attending Physician Name and Address:  Hughie Closs, MD  Relative Name and Phone Number:  [name] (Daughter) [phone]    Current Level of Care: Hospital Recommended Level of Care: Skilled Nursing Facility Prior Approval Number:    Date Approved/Denied:   PASRR Number: PASRR under review  Discharge Plan: SNF    Current Diagnoses: Patient Active Problem List   Diagnosis Date Noted   Sepsis 10/21/2022   Cellulitis 10/20/2022   Hyponatremia 10/20/2022   CAP (community acquired pneumonia) 10/20/2022   Stroke-like symptoms 06/12/2022   Expressive aphasia 06/12/2022   TIA (transient ischemic attack) 06/11/2022   Obesity (BMI 30-39.9) 06/11/2022   Chronic kidney disease, stage 3a 06/11/2022   Acute cough 06/30/2021   Mechanical complication of knee prosthesis, sequela 03/03/2020   Preventative health care 11/19/2019   Patellar tendon rupture, left, subsequent encounter 07/01/2019   Overweight (BMI 25.0-29.9) 06/04/2019   Rupture patellar tendon, left, subsequent encounter 06/03/2019   Osteoarthritis of right knee 05/18/2019   Rotator cuff arthropathy of left shoulder 04/18/2019   Rotator cuff arthropathy of right shoulder 04/18/2019   Bilateral shoulder pain 04/17/2019   Status post total left knee replacement 02/11/2019   Osteoarthritis of left knee 01/16/2019   Food impaction of esophagus 04/02/2018   Primary osteoarthritis of both hands 12/24/2017   Primary osteoarthritis of both knees 12/24/2017   Primary osteoarthritis of both feet 12/24/2017   Osteoarthritis of both shoulders 12/24/2017   Gastroesophageal reflux  disease 08/30/2017   GAD (generalized anxiety disorder) 09/05/2016   Suprapubic mass 08/20/2012   Hypothyroidism    UI (urinary incontinence)    Stricture and stenosis of esophagus 04/11/2011   Chest pain 03/29/2011   Dyslipidemia 03/29/2011   Hypertension 03/29/2011   CAD (coronary artery disease) 03/29/2011   Palpitations 03/29/2011    Orientation RESPIRATION BLADDER Height & Weight     Self, Time, Situation, Place  Normal Incontinent, External catheter (External Urinary Catheter) Weight:   Height:   (149.9 cm)  BEHAVIORAL SYMPTOMS/MOOD NEUROLOGICAL BOWEL NUTRITION STATUS      Continent (WDL) Diet (Please see discharge summary)  AMBULATORY STATUS COMMUNICATION OF NEEDS Skin   Extensive Assist Verbally Other (Comment) (Appropriate for ethnicity,Dry,Ecchymosis,Arm,Bil.,Erythema,Leg,L,Wound/Incision LDAs)                       Personal Care Assistance Level of Assistance  Bathing, Feeding, Dressing Bathing Assistance: Maximum assistance Feeding assistance: Limited assistance Dressing Assistance: Maximum assistance     Functional Limitations Info  Sight, Hearing, Speech Sight Info: Adequate (WDL) Hearing Info: Adequate Speech Info: Adequate    SPECIAL CARE FACTORS FREQUENCY  PT (By licensed PT), OT (By licensed OT)     PT Frequency: 5x min weekly OT Frequency: 5x min weekly            Contractures Contractures Info: Not present    Additional Factors Info  Code Status, Allergies Code Status Info: DNR Allergies Info: Codeine           Current Medications (10/22/2022):  This is the current hospital active medication list Current Facility-Administered Medications  Medication Dose Route Frequency Provider  Last Rate Last Admin   acetaminophen (TYLENOL) tablet 650 mg  650 mg Oral Q6H PRN Synetta Fail, MD       Or   acetaminophen (TYLENOL) suppository 650 mg  650 mg Rectal Q6H PRN Synetta Fail, MD       aspirin EC tablet 81 mg  81 mg Oral  Daily Synetta Fail, MD   81 mg at 10/22/22 0932   cefTRIAXone (ROCEPHIN) 2 g in sodium chloride 0.9 % 100 mL IVPB  2 g Intravenous Q24H Synetta Fail, MD 200 mL/hr at 10/22/22 0933 2 g at 10/22/22 0933   doxycycline (VIBRA-TABS) tablet 100 mg  100 mg Oral Q12H Jamal Collin, RPH   100 mg at 10/22/22 0626   enalapril (VASOTEC) tablet 10 mg  10 mg Oral Daily Synetta Fail, MD   10 mg at 10/22/22 0932   enoxaparin (LOVENOX) injection 30 mg  30 mg Subcutaneous Q24H Synetta Fail, MD   30 mg at 10/21/22 1419   hydrochlorothiazide (HYDRODIURIL) tablet 25 mg  25 mg Oral Daily Hughie Closs, MD   25 mg at 10/22/22 0932   HYDROmorphone (DILAUDID) injection 0.5 mg  0.5 mg Intravenous Q4H PRN Synetta Fail, MD   0.5 mg at 10/22/22 0933   levothyroxine (SYNTHROID) tablet 88 mcg  88 mcg Oral Q0600 Synetta Fail, MD   88 mcg at 10/22/22 0865   metoprolol tartrate (LOPRESSOR) tablet 25 mg  25 mg Oral QHS Pahwani, Daleen Bo, MD       ondansetron (ZOFRAN) injection 4 mg  4 mg Intravenous Q8H PRN Synetta Fail, MD   4 mg at 10/20/22 1703   polyethylene glycol (MIRALAX / GLYCOLAX) packet 17 g  17 g Oral Daily PRN Synetta Fail, MD   17 g at 10/22/22 0423   simvastatin (ZOCOR) tablet 20 mg  20 mg Oral QPM Synetta Fail, MD   20 mg at 10/21/22 1819   sodium chloride flush (NS) 0.9 % injection 3 mL  3 mL Intravenous Q12H Synetta Fail, MD   3 mL at 10/21/22 2206     Discharge Medications: Please see discharge summary for a list of discharge medications.  Relevant Imaging Results:  Relevant Lab Results:   Additional Information [id number]  Delilah Shan, LCSWA

## 2022-10-22 NOTE — TOC Initial Note (Addendum)
Transition of Care [facility]) - Initial/Assessment Note    Patient Details  Name: Kathleen Paul MRN: [id number] Date of Birth: [birth year]  Transition of [facility] CM/SW Contact:    Delilah Shan, LCSWA Phone Number: 10/22/2022, 10:11 AM  Clinical Narrative:                  CSW received consult for possible SNF placement at time of discharge. CSW spoke with patient  regarding PT recommendation of SNF placement at time of discharge. Patient requested for CSW to speak with her daughter [name] regarding PT recommendations/patients dc plan. CSW spoke with patients daughter [name]. Patients daughter reports PTA patient comes from home with her.Patients daughter expressed understanding of PT recommendation and is agreeable for CSW to fax patient out for SNF to see what patients options are. Patients daughter informed CSW still trying to decide on dc plan for patient SNF vrs. Patient returning back home with her at dc. Patients daughters number one choice for SNF placement for patient is [facility]. Patients daughter gave CSW permission to fax out initial referral near the [place] area for possible SNF placement for patient.CSW discussed insurance authorization process and will provided Medicare SNF ratings list with accepted SNF bed offers when available.  Patients passr is pending. CSW submitted requested clinicals to [place] must for review.No further questions reported at this time. CSW to continue to follow and assist with discharge planning needs.   Expected Discharge Plan:  (TBD) Barriers to Discharge: Continued Medical Work up   Patient Goals and CMS Choice Patient states their goals for this hospitalization and ongoing recovery are:: TBD agreeable to fax out for SNF but looking at options SNF Vrs. Home CMS Medicare.gov Compare Post Acute Care list provided to::  (Patient and patients Daughter [name]) Choice offered to / list presented to : Adult Children, Patient      Expected  Discharge Plan and Services In-house Referral: Clinical Social Work     Living arrangements for the past 2 months: Single Family Home                                      Prior Living Arrangements/Services Living arrangements for the past 2 months: Single Family Home Lives with:: Adult Children (Patients daughter [name]) Patient language and need for interpreter reviewed:: Yes Do you feel safe going back to the place where you live?:  (Unsure currently SNF vrs. home)      Need for Family Participation in Patient Care: Yes (Comment) Care giver support system in place?: Yes (comment)   Criminal Activity/Legal Involvement Pertinent to Current Situation/Hospitalization: No - Comment as needed  Activities of Daily Living Home Assistive Devices/Equipment: Dentures (specify type), Shower chair with back, Walker (specify type) (partial dentures upper and lower) ADL Screening (condition at time of admission) Patient's cognitive ability adequate to safely complete daily activities?: Yes Is the patient deaf or have difficulty hearing?: No Does the patient have difficulty seeing, even when wearing glasses/contacts?: No Does the patient have difficulty concentrating, remembering, or making decisions?: No Patient able to express need for assistance with ADLs?: Yes Does the patient have difficulty dressing or bathing?: Yes Independently performs ADLs?: No Communication: Independent Dressing (OT): Needs assistance Is this a change from baseline?: Pre-admission baseline Grooming: Needs assistance Is this a change from baseline?: Pre-admission baseline Feeding: Independent Bathing: Needs assistance Is this a change from baseline?: Pre-admission baseline  Toileting: Needs assistance Is this a change from baseline?: Pre-admission baseline In/Out Bed: Needs assistance Is this a change from baseline?: Pre-admission baseline Walks in Home: Needs assistance Is this a change from baseline?:  Pre-admission baseline Does the patient have difficulty walking or climbing stairs?: Yes Weakness of Legs: Both Weakness of Arms/Hands: Both  Permission Sought/Granted Permission sought to share information with : Case Manager, Family Supports, Magazine features editor Permission granted to share information with : Yes, Verbal Permission Granted  Share Information with NAME: [name]  Permission granted to share info w AGENCY: SNF  Permission granted to share info w Relationship: daughter  Permission granted to share info w Contact Information: [name] [phone]  Emotional Assessment Appearance:: Appears stated age Attitude/Demeanor/Rapport: Gracious Affect (typically observed): Calm Orientation: : Oriented to Self, Oriented to Place, Oriented to  Time, Oriented to Situation Alcohol / Substance Use: Not Applicable Psych Involvement: No (comment)  Admission diagnosis:  CAP (community acquired pneumonia) [J18.9] Patient Active Problem List   Diagnosis Date Noted   Sepsis 10/21/2022   Cellulitis 10/20/2022   Hyponatremia 10/20/2022   CAP (community acquired pneumonia) 10/20/2022   Stroke-like symptoms 06/12/2022   Expressive aphasia 06/12/2022   TIA (transient ischemic attack) 06/11/2022   Obesity (BMI 30-39.9) 06/11/2022   Chronic kidney disease, stage 3a 06/11/2022   Acute cough 06/30/2021   Mechanical complication of knee prosthesis, sequela 03/03/2020   Preventative health care 11/19/2019   Patellar tendon rupture, left, subsequent encounter 07/01/2019   Overweight (BMI 25.0-29.9) 06/04/2019   Rupture patellar tendon, left, subsequent encounter 06/03/2019   Osteoarthritis of right knee 05/18/2019   Rotator cuff arthropathy of left shoulder 04/18/2019   Rotator cuff arthropathy of right shoulder 04/18/2019   Bilateral shoulder pain 04/17/2019   Status post total left knee replacement 02/11/2019   Osteoarthritis of left knee 01/16/2019   Food impaction of esophagus  04/02/2018   Primary osteoarthritis of both hands 12/24/2017   Primary osteoarthritis of both knees 12/24/2017   Primary osteoarthritis of both feet 12/24/2017   Osteoarthritis of both shoulders 12/24/2017   Gastroesophageal reflux disease 08/30/2017   GAD (generalized anxiety disorder) 09/05/2016   Suprapubic mass 08/20/2012   Hypothyroidism    UI (urinary incontinence)    Stricture and stenosis of esophagus 04/11/2011   Chest pain 03/29/2011   Dyslipidemia 03/29/2011   Hypertension 03/29/2011   CAD (coronary artery disease) 03/29/2011   Palpitations 03/29/2011   PCP:  Doreene Nest, NP Pharmacy:   [id number] [place], [place] - [address] AT [facility] [address] [place] [address] Phone: [phone] Fax: [phone]     Social Determinants of Health (SDOH) Social History: SDOH Screenings   Food Insecurity: No Food Insecurity (10/20/2022)  Housing: Low Risk  (10/20/2022)  Transportation Needs: No Transportation Needs (10/20/2022)  Utilities: Not At Risk (10/20/2022)  Alcohol Screen: Low Risk  (12/14/2021)  Depression (PHQ2-9): Low Risk  (12/14/2021)  Financial Resource Strain: Low Risk  (12/14/2021)  Physical Activity: Sufficiently Active (12/14/2021)  Social Connections: Moderately Isolated (12/14/2021)  Stress: No Stress Concern Present (12/14/2021)  Tobacco Use: Medium Risk (10/20/2022)   SDOH Interventions:     Readmission Risk Interventions     No data to display

## 2022-10-23 DIAGNOSIS — J189 Pneumonia, unspecified organism: Secondary | ICD-10-CM | POA: Diagnosis not present

## 2022-10-23 LAB — CBC WITH DIFFERENTIAL/PLATELET
Abs Immature Granulocytes: 0 10*3/uL (ref 0.00–0.07)
Basophils Absolute: 0 10*3/uL (ref 0.0–0.1)
Basophils Relative: 0 %
Eosinophils Absolute: 0 10*3/uL (ref 0.0–0.5)
Eosinophils Relative: 0 %
HCT: 23.9 % — ABNORMAL LOW (ref 36.0–46.0)
Hemoglobin: 8.6 g/dL — ABNORMAL LOW (ref 12.0–15.0)
Lymphocytes Relative: 2 %
Lymphs Abs: 0.3 10*3/uL — ABNORMAL LOW (ref 0.7–4.0)
MCH: 30.5 pg (ref 26.0–34.0)
MCHC: 36 g/dL (ref 30.0–36.0)
MCV: 84.8 fL (ref 80.0–100.0)
Monocytes Absolute: 1.6 10*3/uL — ABNORMAL HIGH (ref 0.1–1.0)
Monocytes Relative: 12 %
Neutro Abs: 11.4 10*3/uL — ABNORMAL HIGH (ref 1.7–7.7)
Neutrophils Relative %: 86 %
Platelets: 134 10*3/uL — ABNORMAL LOW (ref 150–400)
RBC: 2.82 MIL/uL — ABNORMAL LOW (ref 3.87–5.11)
RDW: 14.9 % (ref 11.5–15.5)
WBC: 13.3 10*3/uL — ABNORMAL HIGH (ref 4.0–10.5)
nRBC: 0.2 % (ref 0.0–0.2)
nRBC: 1 /100 WBC — ABNORMAL HIGH

## 2022-10-23 LAB — BASIC METABOLIC PANEL
Anion gap: 13 (ref 5–15)
BUN: 51 mg/dL — ABNORMAL HIGH (ref 8–23)
CO2: 20 mmol/L — ABNORMAL LOW (ref 22–32)
Calcium: 7.9 mg/dL — ABNORMAL LOW (ref 8.9–10.3)
Chloride: 93 mmol/L — ABNORMAL LOW (ref 98–111)
Creatinine, Ser: 1.27 mg/dL — ABNORMAL HIGH (ref 0.44–1.00)
GFR, Estimated: 42 mL/min — ABNORMAL LOW (ref >60)
Glucose, Bld: 107 mg/dL — ABNORMAL HIGH (ref 70–99)
Potassium: 3.5 mmol/L (ref 3.5–5.1)
Sodium: 126 mmol/L — ABNORMAL LOW (ref 135–145)

## 2022-10-23 MED ORDER — BISACODYL 10 MG RE SUPP
10.0000 mg | Freq: Once | RECTAL | Status: AC
  Administered 2022-10-23: 10 mg via RECTAL
  Filled 2022-10-23: qty 1

## 2022-10-23 MED ORDER — HYDROMORPHONE HCL 1 MG/ML PO LIQD
1.0000 mg | Freq: Four times a day (QID) | ORAL | Status: DC | PRN
  Administered 2022-10-23 – 2022-10-26 (×10): 1 mg via ORAL
  Filled 2022-10-23 (×11): qty 1

## 2022-10-23 NOTE — Progress Notes (Signed)
PROGRESS NOTE    RUAH NOETH  DTO:[id number] DOB: Nov 30, 1935 DOA: 10/20/2022 PCP: Doreene Nest, NP   Brief Narrative:  Kathleen Paul is a 87 y.o. female with medical history significant of TIA, CAD, CKD 3A, hypertension, hyperlipidemia, GERD, obesity, hypothyroidism, anxiety, DVT presented with generalized weakness, cough, abdominal pain since about 2 weeks.   She did see her PCP for virtual visit and was prescribed a course of prednisone about a week ago, which she has since completed and then prescribed azithromycin after she failed to improve on the prednisone.  She has chronic bilateral shoulder and left knee pain.  However it is worse after her ambulance ride.  She also developed left lower extremity redness on the morning of admission.  No fever.  Upon arrival to ED, she was slightly tachycardic.  Significant leukocytosis.  Slightly elevated troponin.  Elevated D-dimer followed by CTA negative for PE but positive for pneumonia.  CT abdomen pelvis showed leukocytosis but no other acute pathology.  She received Rocephin and doxycycline and was admitted to hospital service.  Assessment & Plan:   Principal Problem:   CAP (community acquired pneumonia) Active Problems:   TIA (transient ischemic attack)   Hypertension   Dyslipidemia   CAD (coronary artery disease)   Hypothyroidism   Gastroesophageal reflux disease   Chronic kidney disease, stage 3a   GAD (generalized anxiety disorder)   Cellulitis   Hyponatremia   Sepsis  Sepsis secondary to community-acquired pneumonia, gram-positive bacteremia POA: Meets criteria for sepsis based on tachycardia and leukocytosis with a source as pneumonia and left lower extremity cellulitis.  CTA negative for PE.  She is not hypoxic. blood cultures growing H. influenzae.  Will continue Rocephin for 7 days per pharmacy recommendation.    Left lower extremity cellulitis: No reports of trauma.  Erythema improving.  Continue Rocephin.    Acute hyponatremia: Presented with 125, given normal saline in the beginning.  Labs argue against SIADH.  Will monitor for now.  She is asymptomatic.  Currently 126.  Hypomagnesemia: Replaced and resolved.  CKD 3a: At baseline.  Right inguinal pain / Generalized pain > Has had some ongoing generalized pain, Additionally reports specific right inguinal pain. Nothing on imaging to indicate abnormality at the right inguinal area.  Unclear if this could be related to her neurogenic source considering her levo scoliosis noted on CT. continue oral Dilaudid.   Essential hypertension -Pressure very well-controlled, continue home enalapril, will increase dose of metoprolol metoprolol.    History of TIA > Last year had 3-hour episode of aphasia and diagnosed with TIA.  Noted to have severe vertebral artery stenosis on the right but not intervened upon.  Completed 3 months of Plavix now on aspirin alone. - Continue home aspirin, simvastatin   CAD Hyperlipidemia - Continue home simvastatin   Hypothyroidism - Continue on Synthroid   Obesity - Noted  Generalized weakness and bilateral chronic shoulder pain secondary to severe arthritis: Patient and daughters tell me that she has chronic severe pain in bilateral shoulders due to severe osteoarthritis but this has been worse lately.  She has been told that surgery is not an option for her since this is advanced arthritis.  Seen by PT OT, SNF recommended.  TOC consulted.  SVT: Patient had an episode of SVT on the afternoon of 10/21/2022.  She was given IV Lopressor.  SVT resolved.  Her Lopressor which she was taking only once daily, increased to 50 mg p.o. twice daily.  She  had another episode of type II heart block which lasted for 20 seconds and she was asymptomatic.  Constipation: Dulcolax suppository ordered.  DVT prophylaxis: enoxaparin (LOVENOX) injection 30 mg Start: 10/20/22 1400   Code Status: DNR  Family Communication: Daughterpresent at  the bedside.  Plan of care discussed with them in length.  They verbalized understanding.    Status is: Inpatient Remains inpatient appropriate because: Medically stable.  Pending placement.     Estimated body mass index is 28.94 kg/m as calculated from the following:   Height as of this encounter:  (1.499 m).   Weight as of this encounter: 65 kg.    Nutritional Assessment: Body mass index is 28.94 kg/m.Marland Kitchen Seen by dietician.  I agree with the assessment and plan as outlined below: Nutrition Status:        . Skin Assessment: I have examined the patient's skin and I agree with the wound assessment as performed by the wound care RN as outlined below:    Consultants:  None  Procedures:  None  Antimicrobials:  Anti-infectives (From admission, onward)    Start     Dose/Rate Route Frequency Ordered Stop   10/21/22 1800  doxycycline (VIBRA-TABS) tablet 100 mg  Status:  Discontinued        100 mg Oral Every 12 hours 10/21/22 1236 10/23/22 1137   10/21/22 1000  cefTRIAXone (ROCEPHIN) 2 g in sodium chloride 0.9 % 100 mL IVPB        2 g 200 mL/hr over 30 Minutes Intravenous Every 24 hours 10/20/22 1358 10/27/22 0959   10/21/22 0700  doxycycline (VIBRAMYCIN) 100 mg in sodium chloride 0.9 % 250 mL IVPB  Status:  Discontinued        100 mg 125 mL/hr over 120 Minutes Intravenous 2 times daily 10/21/22 0615 10/21/22 1236   10/20/22 1030  cefTRIAXone (ROCEPHIN) 2 g in sodium chloride 0.9 % 100 mL IVPB        2 g 200 mL/hr over 30 Minutes Intravenous  Once 10/20/22 1020 10/20/22 1157   10/20/22 1030  doxycycline (VIBRAMYCIN) 100 mg in sodium chloride 0.9 % 250 mL IVPB        100 mg 125 mL/hr over 120 Minutes Intravenous  Once 10/20/22 1020 10/20/22 1444         Subjective: Patient seen and examined.  She says that her shoulder pain is finally improving.  She is able to move her upper extremities better than yesterday.  She had no other complaint.  Denied any shortness of  breath.  Objective: Vitals:   10/22/22 2325 10/23/22 0328 10/23/22 0839 10/23/22 1316  BP: (!) 135/58 123/65 125/64 134/66  Pulse: 81 83 86 84  Resp: Temp: 98 F (36.7 C)  98.1 F (36.7 C) 97.6 F (36.4 C)  TempSrc: Oral  Oral Oral  SpO2: 100% 96% 97% 100%  Weight:      Height:        Intake/Output Summary (Last 24 hours) at 10/23/2022 1345 Last data filed at 10/23/2022 0840 Gross per 24 hour  Intake --  Output 2050 ml  Net -2050 ml    Filed Weights   10/22/22 2204  Weight: 65 kg    Examination:  General exam: Appears calm and comfortable  Respiratory system: Rhonchi bilaterally.  Respiratory effort normal. Cardiovascular system: S1 & S2 heard, RRR. No JVD, murmurs, rubs, gallops or clicks. No pedal edema. Gastrointestinal system: Abdomen is nondistended, soft and nontender. No organomegaly  or masses felt. Normal bowel sounds heard. Central nervous system: Alert and oriented. No focal neurological deficits. Extremities: Symmetric 5 x 5 power. Skin: No rashes, lesions or ulcers.  Psychiatry: Judgement and insight appear normal. Mood & affect appropriate.   Data Reviewed: I have personally reviewed following labs and imaging studies  CBC: Recent Labs  Lab 10/20/22 0927 10/21/22 0638 10/22/22 0146 10/23/22 0150  WBC 21.4* 15.3* 13.5* 13.3*  NEUTROABS 20.1*  --  11.7* 11.4*  HGB 10.4* 8.6* 8.1* 8.6*  HCT 30.2* 24.5* 23.1* 23.9*  MCV 88.6 86.9 85.9 84.8  PLT 201 154 135* 134*    Basic Metabolic Panel: Recent Labs  Lab 10/20/22 0927 10/20/22 1812 10/20/22 2217 10/21/22 0211 10/21/22 0638 10/22/22 0146 10/23/22 0150  NA 125*   < > 127* 127* 126* 125* 126*  K 4.2   < > 3.8 3.8 3.9 3.6 3.5  CL 91*   < > 95* 94* 94* 95* 93*  CO2 22   < > 18* 20* 18* 18* 20*  GLUCOSE 64*   < > 113* 100* 81 107* 107*  BUN 61*   < > 50* 50* 53* 51* 51*  CREATININE 1.31*   < > 1.14* 1.20* 1.29* 1.31* 1.27*  CALCIUM 8.4*   < > 7.9* 7.8* 7.7* 7.4* 7.9*  MG 1.6*   --   --   --  2.1  --   --    < > = values in this interval not displayed.    GFR: Estimated Creatinine Clearance: 27 mL/min (A) (by C-G formula based on SCr of 1.27 mg/dL (H)). Liver Function Tests: No results for input(s): "AST", "ALT", "ALKPHOS", "BILITOT", "PROT", "ALBUMIN" in the last 168 hours. No results for input(s): "LIPASE", "AMYLASE" in the last 168 hours. No results for input(s): "AMMONIA" in the last 168 hours. Coagulation Profile: No results for input(s): "INR", "PROTIME" in the last 168 hours. Cardiac Enzymes: No results for input(s): "CKTOTAL", "CKMB", "CKMBINDEX", "TROPONINI" in the last 168 hours. BNP (last 3 results) No results for input(s): "PROBNP" in the last 8760 hours. HbA1C: No results for input(s): "HGBA1C" in the last 72 hours. CBG: Recent Labs  Lab 10/21/22 1145  GLUCAP 97    Lipid Profile: No results for input(s): "CHOL", "HDL", "LDLCALC", "TRIG", "CHOLHDL", "LDLDIRECT" in the last 72 hours. Thyroid Function Tests: No results for input(s): "TSH", "T4TOTAL", "FREET4", "T3FREE", "THYROIDAB" in the last 72 hours. Anemia Panel: No results for input(s): "VITAMINB12", "FOLATE", "FERRITIN", "TIBC", "IRON", "RETICCTPCT" in the last 72 hours. Sepsis Labs: Recent Labs  Lab 10/20/22 1045 10/20/22 1812 10/21/22 0638  PROCALCITON  --  7.98 7.41  LATICACIDVEN 1.6  --   --      Recent Results (from the past 240 hour(s))  SARS Coronavirus 2 by RT PCR (hospital order, performed in [facility] hospital lab) *cepheid single result test* Anterior Nasal Swab     Status: None   Collection Time: 10/20/22  9:21 AM   Specimen: Anterior Nasal Swab  Result Value Ref Range Status   SARS Coronavirus 2 by RT PCR NEGATIVE NEGATIVE Final    Comment: Performed at [facility], [address], [place], [place] [address]  Blood culture (routine x 2)     Status: Abnormal (Preliminary result)   Collection Time: 10/20/22 10:45 AM   Specimen: BLOOD RIGHT ARM  Result  Value Ref Range Status   Specimen Description BLOOD RIGHT ARM  Final   Special Requests   Final  BOTTLES DRAWN AEROBIC AND ANAEROBIC Blood Culture adequate volume   Culture  Setup Time   Final    GRAM NEGATIVE RODS AEROBIC BOTTLE ONLY CRITICAL RESULT CALLED TO, READ BACK BY AND VERIFIED WITH: PHARMD M. Clovis Riley [id number] FCP    Culture (A)  Final    HAEMOPHILUS INFLUENZAE BETA LACTAMASE POSITIVE Performed at [facility], [address], [place], [place] [address]    Report Status PENDING  Incomplete  Blood Culture ID Panel (Reflexed)     Status: Abnormal   Collection Time: 10/20/22 10:45 AM  Result Value Ref Range Status   Enterococcus faecalis NOT DETECTED NOT DETECTED Final   Enterococcus Faecium NOT DETECTED NOT DETECTED Final   Listeria monocytogenes NOT DETECTED NOT DETECTED Final   Staphylococcus species NOT DETECTED NOT DETECTED Final   Staphylococcus aureus (BCID) NOT DETECTED NOT DETECTED Final   Staphylococcus epidermidis NOT DETECTED NOT DETECTED Final   Staphylococcus lugdunensis NOT DETECTED NOT DETECTED Final   Streptococcus species NOT DETECTED NOT DETECTED Final   Streptococcus agalactiae NOT DETECTED NOT DETECTED Final   Streptococcus pneumoniae NOT DETECTED NOT DETECTED Final   Streptococcus pyogenes NOT DETECTED NOT DETECTED Final   A.calcoaceticus-baumannii NOT DETECTED NOT DETECTED Final   Bacteroides fragilis NOT DETECTED NOT DETECTED Final   Enterobacterales NOT DETECTED NOT DETECTED Final   Enterobacter cloacae complex NOT DETECTED NOT DETECTED Final   Escherichia coli NOT DETECTED NOT DETECTED Final   Klebsiella aerogenes NOT DETECTED NOT DETECTED Final   Klebsiella oxytoca NOT DETECTED NOT DETECTED Final   Klebsiella pneumoniae NOT DETECTED NOT DETECTED Final   Proteus species NOT DETECTED NOT DETECTED Final   Salmonella species NOT DETECTED NOT DETECTED Final   Serratia marcescens NOT DETECTED NOT DETECTED Final   Haemophilus influenzae  DETECTED (A) NOT DETECTED Final    Comment: CRITICAL RESULT CALLED TO, READ BACK BY AND VERIFIED WITH: Darci Current MITCHEL [id number] BY FCP    Neisseria meningitidis NOT DETECTED NOT DETECTED Final   Pseudomonas aeruginosa NOT DETECTED NOT DETECTED Final   Stenotrophomonas maltophilia NOT DETECTED NOT DETECTED Final   Candida albicans NOT DETECTED NOT DETECTED Final   Candida auris NOT DETECTED NOT DETECTED Final   Candida glabrata NOT DETECTED NOT DETECTED Final   Candida krusei NOT DETECTED NOT DETECTED Final   Candida parapsilosis NOT DETECTED NOT DETECTED Final   Candida tropicalis NOT DETECTED NOT DETECTED Final   Cryptococcus neoformans/gattii NOT DETECTED NOT DETECTED Final    Comment: Performed at [facility], [address] [place], [place] [address]  Blood culture (routine x 2)     Status: None (Preliminary result)   Collection Time: 10/20/22  6:09 PM   Specimen: BLOOD  Result Value Ref Range Status   Specimen Description BLOOD SITE NOT SPECIFIED  Final   Special Requests   Final    BOTTLES DRAWN AEROBIC ONLY Blood Culture adequate volume   Culture   Final    NO GROWTH 3 DAYS Performed at [facility], [address] [place], [place] [address]    Report Status PENDING  Incomplete     Radiology Studies: No results found.  Scheduled Meds:  aspirin EC  81 mg Oral Daily   bisacodyl  10 mg Rectal Once   enalapril  10 mg Oral Daily   enoxaparin (LOVENOX) injection  30 mg Subcutaneous Q24H   hydrochlorothiazide  25 mg Oral Daily   levothyroxine  88 mcg Oral Q0600  metoprolol tartrate  25 mg Oral QHS   simvastatin  20 mg Oral QPM   sodium chloride flush  3 mL Intravenous Q12H   Continuous Infusions:  cefTRIAXone (ROCEPHIN)  IV 2 g (10/23/22 0804)     LOS: 2 days   Hughie Closs, MD [facility]  10/23/2022, 1:45 PM   *Please note that this is a verbal dictation therefore any spelling or grammatical errors are due to the "[employer]agon [employer]  One" system interpretation.  Please page via Amion and do not message via secure chat for urgent patient care matters. Secure chat can be used for non urgent patient care matters.  How to contact the [facility] Attending or Consulting provider 7A - 7P or covering provider during after hours 7P -7A, for this patient?  Check the care team in [facility] and look for a) attending/consulting [facility] provider listed and b) the [facility] team listed. Page or secure chat 7A-7P. Log into [email or web address] and use [facility]'s universal password to access. If you do not have the password, please contact the hospital operator. Locate the [facility] provider you are looking for under [facility] and page to a number that you can be directly reached. If you still have difficulty reaching the provider, please page the [facility] (Director on Call) for the Hospitalists listed on amion for assistance.

## 2022-10-23 NOTE — Progress Notes (Addendum)
Physical Therapy Treatment Patient Details Name: Kathleen Paul MRN: [id number] DOB: 06-28-1936 Today's Date: 10/23/2022   History of Present Illness Pt is an 87 y.o. female who presented 10/20/22 with generalized weakness, cough, and abdominal pain. Pt admitted with sepsis secondary to CAP, L lower extremity cellulitis, and acute hyponatremia. PMH: TIA, CAD, CKD 3A, HTN, HLD, GERD, obesity, hypothyroidism, anxiety, DVT, cancer, scoliosis.    PT Comments    Pt received in supine, agreeable to therapy session and with good participation and fair tolerance for bed mobility and transfer training with RW. Pt needing +2 maxA to totalA +2 to initiate and perform log roll transfer to EOB, STS from elevated surfaces<>RW and step pivot transfer x1 to [facility] with assist for device management and some manual assist for weight shifting and foot placement while pivoting. After attempting to toilet, pt with increased fatigue and unable to take steps to recliner, so chair pulled up behind her. Hoyer lift in place for pt safety with return transfer later in the day, RN aware. Family present througout and encouraging. Pt with vestibular symptoms and reports a history of vertigo, she may benefit from PT vestibular evaluation next session to address these concerns. Pt continues to benefit from PT services to progress toward functional mobility goals.   Recommendations for follow up therapy are one component of a multi-disciplinary discharge planning process, led by the attending physician.  Recommendations may be updated based on patient status, additional functional criteria and insurance authorization.  Follow Up Recommendations  Can patient physically be transported by private vehicle: No    Assistance Recommended at Discharge Frequent or constant Supervision/Assistance  Patient can return home with the following Two people to help with walking and/or transfers;Assistance with cooking/housework;Assist for  transportation;Help with stairs or ramp for entrance;Direct supervision/assist for medications management;Two people to help with bathing/dressing/bathroom   Equipment Recommendations  BSC/3in1;Wheelchair (measurements PT);Wheelchair cushion (measurements PT);Other (comment) (consider mechanical lift and hospital bed pending progress; currently needing these)    Recommendations for Other Services       Precautions / Restrictions Precautions Precautions: Fall Precaution Comments: urinary incontinence Restrictions Weight Bearing Restrictions: No     Mobility  Bed Mobility Overal bed mobility: Needs Assistance Bed Mobility: Rolling, Sidelying to Sit Rolling: Max assist, +2 for physical assistance Sidelying to sit: Total assist, +2 for physical assistance       General bed mobility comments: Pt needing multimodal cues for RLE placement and RUE assist for rolling toward her L side, once on her side needing maxA to advance BLE over EOB and +2 totalA for trunk raising to sit EOB and for bil hip scooting to foot flat with bed pad assist.    Transfers Overall transfer level: Needs assistance Equipment used: Rolling walker (2 wheels) Transfers: Sit to/from Stand, Bed to chair/wheelchair/BSC Sit to Stand: Mod assist, +2 physical assistance, Max assist, From elevated surface   Step pivot transfers: Max assist, +2 physical assistance, From elevated surface       General transfer comment: Pt able to take pivotal steps toward her R side from EOB>BSC, needs dense cues for posture and totalA for RW management and assist for weight shifting to L/R while stepping to offload opposite leg, pt at times needs manual assist to slide legs further when she fatigues. Pt attempting to sit prior to reaching proximity to [facility] and needs it scooted closer to her. Upon standing from [facility], pt with difficulty maintaining fully upright posture and with posterior lean/downward gaze, so recliner  chair pulled up behind  her prior to sitting. Pt totalA for peri care after using BSC. Lift pad in chair under her so nursing staff can use hoyer lift for return transfer safety later in the day.    Ambulation/Gait               General Gait Details: unable at this time, see step pivot transfer comments   Stairs             Wheelchair Mobility    Modified Rankin (Stroke Patients Only)       Balance Overall balance assessment: Needs assistance Sitting-balance support: Feet supported, Bilateral upper extremity supported Sitting balance-Leahy Scale: Fair Sitting balance - Comments: Able to sit statically with min guard-minA for balance, limited by pain   Standing balance support: Bilateral upper extremity supported, During functional activity, Reliant on assistive device for balance Standing balance-Leahy Scale: Poor (poor initially, zero with fatigue) Standing balance comment: +2 maxA for static standing and nearly totalA with fatigue when weight shifting                            Cognition Arousal/Alertness: Awake/alert Behavior During Therapy: Anxious, WFL for tasks assessed/performed Overall Cognitive Status: Within Functional Limits for tasks assessed                                 General Comments: Pt anxious in regards to pain but overall pleasant and appreciative. Follows commands appropriately, but again limited in initiation by pain. Decreased recall of sequencing for body mechanics and precs, pt internally distracted due to pain/vertigo sx with mobility tasks.        Exercises Other Exercises Other Exercises: Supine BLE AAROM: heel slides, SLR, knee extension x5 reps; seated hip flexion x3 reps bilateral Other Exercises: supine BLE AROM: ankle pumps x10 reps ea Other Exercises: L/R trunk leans (with assist for balance/safety) for improved scooting technique while seated EOB x2 reps ea side    General Comments General comments (skin integrity, edema,  etc.): gait belt given for personal use and pt safety mobilizing in hospital room, SpO2 reading 98% with good pleth prior to OOB and poor signal during transfers, when signal improves reading WFL on RA HR 80 bpm at rest. Pt limited due to nausea with transfers and reports hx vertigo, possible R lateral gaze nystagmus, would benefit from PT vestibular eval acutely if possible next PT session. Pt sleeps in lift chair at home. Hoyer pad under her hips for pt safety with transfers.      Pertinent Vitals/Pain Pain Assessment Pain Assessment: 0-10 Pain Score: 7  (after IV dilaudid) Pain Location: Bil shoulder, bil legs, groin, generalized Pain Descriptors / Indicators: Discomfort, Grimacing, Guarding, Moaning, Crying Pain Intervention(s): Monitored during session, Limited activity within patient's tolerance, Premedicated before session, Other (comment) (auditory crepitus with standing)    Home Living                          Prior Function            PT Goals (current goals can now be found in the care plan section) Acute Rehab PT Goals Patient Stated Goal: to reduce pain PT Goal Formulation: With patient/family Time For Goal Achievement: 11/04/22 Progress towards PT goals: Progressing toward goals    Frequency    Min 1X/week  PT Plan Current plan remains appropriate    Co-evaluation              AM-PAC PT "6 Clicks" Mobility   Outcome Measure  Help needed turning from your back to your side while in a flat bed without using bedrails?: Total (+2 maxA or > for this and all transfers) Help needed moving from lying on your back to sitting on the side of a flat bed without using bedrails?: Total Help needed moving to and from a bed to a chair (including a wheelchair)?: Total Help needed standing up from a chair using your arms (e.g., wheelchair or bedside chair)?: Total Help needed to walk in hospital room?: Total Help needed climbing 3-5 steps with a railing? :  Total 6 Click Score: 6    End of Session Equipment Utilized During Treatment: Gait belt Activity Tolerance: Patient tolerated treatment well;Patient limited by pain;Other (comment) (c/o vertigo sx and nausea) Patient left: in chair;with call bell/phone within reach;with chair alarm set;Other (comment);with family/visitor present (daughter in law and daughter present with her, pt has hoyer lift pad in chair as well for safety with return transfer to bed) Nurse Communication: Mobility status;Precautions;Other (comment) (pt asking about lidocaine patch, nausea meds (RN gave anti-nausea meds during session), wants a bath and to have her bed linens changed; needs hoyer for return to bed for safety) PT Visit Diagnosis: Muscle weakness (generalized) (M62.81);Difficulty in walking, not elsewhere classified (R26.2);Pain Pain - part of body: Shoulder;Leg     Time: 1610-9604 PT Time Calculation (min) (ACUTE ONLY): 55 min  Charges:  $Therapeutic Exercise: 8-22 mins $Therapeutic Activity: 38-52 mins                     Kadelyn Dimascio P., PTA Acute Rehabilitation Services Secure Chat Preferred 9a-5:30pm Office: [phone]    Dorathy Kinsman [facility] 10/23/2022, 12:59 PM

## 2022-10-24 DIAGNOSIS — J189 Pneumonia, unspecified organism: Secondary | ICD-10-CM | POA: Diagnosis not present

## 2022-10-24 LAB — CBC WITH DIFFERENTIAL/PLATELET
Abs Immature Granulocytes: 1.29 10*3/uL — ABNORMAL HIGH (ref 0.00–0.07)
Basophils Absolute: 0 10*3/uL (ref 0.0–0.1)
Basophils Relative: 0 %
Eosinophils Absolute: 0 10*3/uL (ref 0.0–0.5)
Eosinophils Relative: 0 %
HCT: 22.8 % — ABNORMAL LOW (ref 36.0–46.0)
Hemoglobin: 8.3 g/dL — ABNORMAL LOW (ref 12.0–15.0)
Immature Granulocytes: 8 %
Lymphocytes Relative: 4 %
Lymphs Abs: 0.6 10*3/uL — ABNORMAL LOW (ref 0.7–4.0)
MCH: 30.5 pg (ref 26.0–34.0)
MCHC: 36.4 g/dL — ABNORMAL HIGH (ref 30.0–36.0)
MCV: 83.8 fL (ref 80.0–100.0)
Monocytes Absolute: 3.4 10*3/uL — ABNORMAL HIGH (ref 0.1–1.0)
Monocytes Relative: 22 %
Neutro Abs: 10.1 10*3/uL — ABNORMAL HIGH (ref 1.7–7.7)
Neutrophils Relative %: 66 %
Platelets: 154 10*3/uL (ref 150–400)
RBC: 2.72 MIL/uL — ABNORMAL LOW (ref 3.87–5.11)
RDW: 14.9 % (ref 11.5–15.5)
WBC: 15.4 10*3/uL — ABNORMAL HIGH (ref 4.0–10.5)
nRBC: 0 % (ref 0.0–0.2)

## 2022-10-24 LAB — LEGIONELLA PNEUMOPHILA SEROGP 1 UR AG: L. pneumophila Serogp 1 Ur Ag: NEGATIVE

## 2022-10-24 LAB — BASIC METABOLIC PANEL
Anion gap: 14 (ref 5–15)
BUN: 43 mg/dL — ABNORMAL HIGH (ref 8–23)
CO2: 21 mmol/L — ABNORMAL LOW (ref 22–32)
Calcium: 8 mg/dL — ABNORMAL LOW (ref 8.9–10.3)
Chloride: 93 mmol/L — ABNORMAL LOW (ref 98–111)
Creatinine, Ser: 1.17 mg/dL — ABNORMAL HIGH (ref 0.44–1.00)
GFR, Estimated: 46 mL/min — ABNORMAL LOW (ref >60)
Glucose, Bld: 98 mg/dL (ref 70–99)
Potassium: 3.2 mmol/L — ABNORMAL LOW (ref 3.5–5.1)
Sodium: 128 mmol/L — ABNORMAL LOW (ref 135–145)

## 2022-10-24 LAB — CULTURE, BLOOD (ROUTINE X 2): Special Requests: ADEQUATE

## 2022-10-24 MED ORDER — POTASSIUM CHLORIDE CRYS ER 20 MEQ PO TBCR
40.0000 meq | EXTENDED_RELEASE_TABLET | ORAL | Status: AC
  Administered 2022-10-24 (×2): 40 meq via ORAL
  Filled 2022-10-24 (×2): qty 2

## 2022-10-24 MED ORDER — AMOXICILLIN-POT CLAVULANATE 875-125 MG PO TABS
1.0000 | ORAL_TABLET | Freq: Two times a day (BID) | ORAL | Status: DC
  Administered 2022-10-25 – 2022-10-26 (×3): 1 via ORAL
  Filled 2022-10-24 (×3): qty 1

## 2022-10-24 MED ORDER — TRAMADOL HCL 50 MG PO TABS
50.0000 mg | ORAL_TABLET | Freq: Every day | ORAL | 0 refills | Status: DC | PRN
Start: 2022-10-24 — End: 2022-10-27

## 2022-10-24 NOTE — Progress Notes (Signed)
Patient starting to develop pressure area on coccyx. Patient refuses to get up and move or even lay on her side or be propped. Educated on the reason of need to be moved and/ or propped and patient still refused.

## 2022-10-24 NOTE — TOC Progression Note (Signed)
Transition of Care [facility]) - Progression Note    Patient Details  Name: Kathleen Paul MRN: [id number] Date of Birth: 11/20/1935  Transition of Care [facility]) CM/SW Contact  Eduard Roux, [place] Phone Number: 10/24/2022, 4:44 PM  Clinical Narrative:    Spoke with patient's son, he states they should have an SNF choice by tomorrow morning. CSW will follow up.  Antony Blackbird, MSW, LCSW Clinical Social Worker    Expected Discharge Plan:  (TBD) Barriers to Discharge: Continued Medical Work up  Expected Discharge Plan and Services In-house Referral: Clinical Social Work     Living arrangements for the past 2 months: Single Family Home                                       Social Determinants of Health (SDOH) Interventions SDOH Screenings   Food Insecurity: No Food Insecurity (10/20/2022)  Housing: Low Risk  (10/20/2022)  Transportation Needs: No Transportation Needs (10/20/2022)  Utilities: Not At Risk (10/20/2022)  Alcohol Screen: Low Risk  (12/14/2021)  Depression (PHQ2-9): Low Risk  (12/14/2021)  Financial Resource Strain: Low Risk  (12/14/2021)  Physical Activity: Sufficiently Active (12/14/2021)  Social Connections: Moderately Isolated (12/14/2021)  Stress: No Stress Concern Present (12/14/2021)  Tobacco Use: Medium Risk (10/20/2022)    Readmission Risk Interventions     No data to display

## 2022-10-24 NOTE — Progress Notes (Signed)
Occupational Therapy Treatment Patient Details Name: Kathleen Paul MRN: [id number] DOB: 23-Jun-1936 Today's Date: 10/24/2022   History of present illness Pt is an 87 y.o. female who presented 10/20/22 with generalized weakness, cough, and abdominal pain. Pt admitted with sepsis secondary to CAP, L lower extremity cellulitis, and acute hyponatremia. PMH: TIA, CAD, CKD 3A, HTN, HLD, GERD, obesity, hypothyroidism, anxiety, DVT, cancer, scoliosis.   OT comments  Pt continuing to progress towards pt focused goals. Pt reluctant to perform STS but willing to with encouragement from OT/mobility and family. Pt continues to need 2+ mod to max A for bed mobility and STS, was limited by pain in today's session.    Recommendations for follow up therapy are one component of a multi-disciplinary discharge planning process, led by the attending physician.  Recommendations may be updated based on patient status, additional functional criteria and insurance authorization.    Assistance Recommended at Discharge Frequent or constant Supervision/Assistance  Patient can return home with the following  A lot of help with walking and/or transfers;A lot of help with bathing/dressing/bathroom;Assistance with cooking/housework;Assistance with feeding;Direct supervision/assist for financial management;Direct supervision/assist for medications management;Assist for transportation;Help with stairs or ramp for entrance   Equipment Recommendations  None recommended by OT    Recommendations for Other Services      Precautions / Restrictions Precautions Precautions: Fall Precaution Comments: urinary incontinence Restrictions Weight Bearing Restrictions: No       Mobility Bed Mobility Overal bed mobility: Needs Assistance Bed Mobility: Supine to Sit, Sit to Supine     Supine to sit: +2 for physical assistance, HOB elevated, Max assist Sit to supine: Total assist, +2 for physical assistance, HOB elevated         Transfers Overall transfer level: Needs assistance Equipment used: Rolling walker (2 wheels) Transfers: Sit to/from Stand Sit to Stand: Mod assist, +2 physical assistance                 Balance Overall balance assessment: Needs assistance Sitting-balance support: Feet supported, Bilateral upper extremity supported Sitting balance-Leahy Scale: Fair     Standing balance support: Bilateral upper extremity supported, During functional activity, Reliant on assistive device for balance Standing balance-Leahy Scale: Poor                             ADL either performed or assessed with clinical judgement   ADL Overall ADL's : Needs assistance/impaired     Grooming: Wash/dry face;Sitting;Min guard Grooming Details (indicate cue type and reason): sitting EOB             Lower Body Dressing: Total assistance;Sitting/lateral leans Lower Body Dressing Details (indicate cue type and reason): don/doff socks sitting EOB               General ADL Comments: Pt limited by pain, willing to stand but no transfer to chair    Extremity/Trunk Assessment              Vision       Perception     Praxis      Cognition Arousal/Alertness: Awake/alert Behavior During Therapy: Anxious, WFL for tasks assessed/performed Overall Cognitive Status: Within Functional Limits for tasks assessed                                          Exercises  Shoulder Instructions       General Comments Pt reported dizziness when positioned EOB, BP 101/79. At end of OT session BP 141/83, HR up to 108 max with functional activity    Pertinent Vitals/ Pain       Pain Assessment Pain Assessment: 0-10 Pain Score: 8  Pain Location: Shoulders and R hip Pain Descriptors / Indicators: Guarding, Grimacing, Discomfort Pain Intervention(s): Limited activity within patient's tolerance, Monitored during session  Home Living                                           Prior Functioning/Environment              Frequency  Min 2X/week        Progress Toward Goals  OT Goals(current goals can now be found in the care plan section)  Progress towards OT goals: Progressing toward goals  Acute Rehab OT Goals Patient Stated Goal: less pain OT Goal Formulation: With patient Time For Goal Achievement: 11/04/22 Potential to Achieve Goals: Good  Plan Discharge plan remains appropriate;Frequency remains appropriate    Co-evaluation                 AM-PAC OT "6 Clicks" Daily Activity     Outcome Measure   Help from another person eating meals?: A Little Help from another person taking care of personal grooming?: A Lot Help from another person toileting, which includes using toliet, bedpan, or urinal?: Total Help from another person bathing (including washing, rinsing, drying)?: A Lot Help from another person to put on and taking off regular upper body clothing?: A Lot Help from another person to put on and taking off regular lower body clothing?: Total 6 Click Score: 11    End of Session Equipment Utilized During Treatment: Gait belt;Rolling walker (2 wheels)  OT Visit Diagnosis: Unsteadiness on feet (R26.81);Other abnormalities of gait and mobility (R26.89);Muscle weakness (generalized) (M62.81);Pain Pain - part of body: Hip;Shoulder   Activity Tolerance Patient limited by pain   Patient Left in bed;with call bell/phone within reach;with family/visitor present   Nurse Communication Mobility status (rolling schedule, pain meds)        Time: 4540-9811 OT Time Calculation (min): 22 min  Charges: OT General Charges $OT Visit: 1 Visit OT Treatments $Therapeutic Activity: 8-22 mins  10/24/2022  AB, OTR/L  Acute Rehabilitation Services  Office: [phone]   Tristan Schroeder 10/24/2022, 4:34 PM

## 2022-10-24 NOTE — Progress Notes (Signed)
Received message from Onalee Hua RN w/ Infection Prevention.  He acknowledge understanding of the + blood cultures.  States standard precautions are all that is needed for this type of infection lab.  ------JM

## 2022-10-24 NOTE — Progress Notes (Signed)
PROGRESS NOTE    [name]  ZOX:[id number] DOB: 28-Jan-1936 DOA: 10/20/2022 PCP: Doreene Nest, NP   Brief Narrative:  Kathleen Paul is a 87 y.o. female with medical history significant of TIA, CAD, CKD 3A, hypertension, hyperlipidemia, GERD, obesity, hypothyroidism, anxiety, DVT presented with generalized weakness, cough, abdominal pain since about 2 weeks.   She did see her PCP for virtual visit and was prescribed a course of prednisone about a week ago, which she has since completed and then prescribed azithromycin after she failed to improve on the prednisone.  She has chronic bilateral shoulder and left knee pain.  However it is worse after her ambulance ride.  She also developed left lower extremity redness on the morning of admission.  No fever.  Upon arrival to ED, she was slightly tachycardic.  Significant leukocytosis.  Slightly elevated troponin.  Elevated D-dimer followed by CTA negative for PE but positive for pneumonia.  CT abdomen pelvis showed leukocytosis but no other acute pathology.  She received Rocephin and doxycycline and was admitted to hospital service.  Assessment & Plan:   Principal Problem:   CAP (community acquired pneumonia) Active Problems:   TIA (transient ischemic attack)   Hypertension   Dyslipidemia   CAD (coronary artery disease)   Hypothyroidism   Gastroesophageal reflux disease   Chronic kidney disease, stage 3a   GAD (generalized anxiety disorder)   Cellulitis   Hyponatremia   Sepsis  Sepsis secondary to community-acquired pneumonia, haemophilus Influenza bacteremia POA: Meets criteria for sepsis based on tachycardia and leukocytosis with a source as pneumonia and left lower extremity cellulitis.  CTA negative for PE.  She is not hypoxic. blood cultures growing H. influenzae.  Will need 7 days of therapy.  Will transition from Rocephin to Augmentin today.  Left lower extremity cellulitis: No reports of trauma.  Erythema almost resolved.   Transition to Augmentin   Acute hyponatremia: Presented with 125, given normal saline in the beginning.  Labs argue against SIADH.  Will monitor for now.  She is asymptomatic.  Currently 128.  Hypomagnesemia: Replaced and resolved.  Hypokalemia:  Will replace.  CKD 3a: At baseline.  Right inguinal pain / Generalized pain > Has had some ongoing generalized pain, Additionally reports specific right inguinal pain. Nothing on imaging to indicate abnormality at the right inguinal area.  Unclear if this could be related to her neurogenic source considering her levo scoliosis noted on CT. continue oral Dilaudid.   Essential hypertension -Pressure very well-controlled, continue home enalapril, increased dose of metoprolol.   History of TIA > Last year had 3-hour episode of aphasia and diagnosed with TIA.  Noted to have severe vertebral artery stenosis on the right but not intervened upon.  Completed 3 months of Plavix now on aspirin alone. - Continue home aspirin, simvastatin   CAD Hyperlipidemia - Continue home simvastatin   Hypothyroidism - Continue on Synthroid   Obesity - Noted  Generalized weakness and bilateral chronic shoulder pain secondary to severe arthritis: Patient and daughters tell me that she has chronic severe pain in bilateral shoulders due to severe osteoarthritis but this has been worse lately.  She has been told that surgery is not an option for her since this is advanced arthritis.  Seen by PT OT, SNF recommended.  TOC consulted.  SVT: Patient had an episode of SVT on the afternoon of 10/21/2022.  She was given IV Lopressor.  SVT resolved.  Her Lopressor which she was taking only once daily, increased  to 50 mg p.o. twice daily.  She had another episode of type II heart block which lasted for 20 seconds and she was asymptomatic.  No issues with the rhythm since 10/22/2022.  Constipation: Resolved with Dulcolax suppository.  DVT prophylaxis: enoxaparin (LOVENOX)  injection 30 mg Start: 10/20/22 1400   Code Status: DNR  Family Communication: Daughterpresent at the bedside.  Plan of care discussed with them in length.  They verbalized understanding.    Status is: Inpatient Remains inpatient appropriate because: Medically stable.  Pending placement.  Estimated body mass index is 28.94 kg/m as calculated from the following:   Height as of this encounter:  (1.499 m).   Weight as of this encounter: 65 kg.    Nutritional Assessment: Body mass index is 28.94 kg/m.Marland Kitchen Seen by dietician.  I agree with the assessment and plan as outlined below: Nutrition Status:        . Skin Assessment: I have examined the patient's skin and I agree with the wound assessment as performed by the wound care RN as outlined below:    Consultants:  None  Procedures:  None  Antimicrobials:  Anti-infectives (From admission, onward)    Start     Dose/Rate Route Frequency Ordered Stop   10/25/22 1000  amoxicillin-clavulanate (AUGMENTIN) 875-125 MG per tablet 1 tablet        1 tablet Oral Every 12 hours 10/24/22 1101 10/27/22 0959   10/21/22 1800  doxycycline (VIBRA-TABS) tablet 100 mg  Status:  Discontinued        100 mg Oral Every 12 hours 10/21/22 1236 10/23/22 1137   10/21/22 1000  cefTRIAXone (ROCEPHIN) 2 g in sodium chloride 0.9 % 100 mL IVPB  Status:  Discontinued        2 g 200 mL/hr over 30 Minutes Intravenous Every 24 hours 10/20/22 1358 10/24/22 1101   10/21/22 0700  doxycycline (VIBRAMYCIN) 100 mg in sodium chloride 0.9 % 250 mL IVPB  Status:  Discontinued        100 mg 125 mL/hr over 120 Minutes Intravenous 2 times daily 10/21/22 0615 10/21/22 1236   10/20/22 1030  cefTRIAXone (ROCEPHIN) 2 g in sodium chloride 0.9 % 100 mL IVPB        2 g 200 mL/hr over 30 Minutes Intravenous  Once 10/20/22 1020 10/20/22 1157   10/20/22 1030  doxycycline (VIBRAMYCIN) 100 mg in sodium chloride 0.9 % 250 mL IVPB        100 mg 125 mL/hr over 120 Minutes  Intravenous  Once 10/20/22 1020 10/20/22 1444         Subjective: Patient seen and examined.  Daughter at the bedside.  Patient yet again complains of generalized body ache and bilateral shoulder pain which are chronic complaints.  No other complaint.  She had 3 bowel movement yesterday after receiving Dulcolax suppository.  She is happy about that.  Objective: Vitals:   10/23/22 1944 10/23/22 2310 10/24/22 0330 10/24/22 0748  BP: (!) 151/86 132/66 (!) 141/63 (!) 152/67  Pulse: 98 82 80 85  Resp: Temp: 97.6 F (36.4 C) 97.6 F (36.4 C) 97.7 F (36.5 C) 97.8 F (36.6 C)  TempSrc: Oral Oral Oral Oral  SpO2: 99% 99% 90% 98%  Weight:      Height:        Intake/Output Summary (Last 24 hours) at 10/24/2022 1139 Last data filed at 10/24/2022 0753 Gross per 24 hour  Intake 360.39 ml  Output 1100 ml  Net -739.61 ml    Filed Weights   10/22/22 2204  Weight: 65 kg    Examination:  General exam: Appears calm and comfortable  Respiratory system: Rhonchi bilaterally are improving.  Respiratory effort normal. Cardiovascular system: S1 & S2 heard, RRR. No JVD, murmurs, rubs, gallops or clicks.  Trace pitting edema but Gastrointestinal system: Abdomen is nondistended, soft and nontender. No organomegaly or masses felt. Normal bowel sounds heard. Central nervous system: Alert and oriented. No focal neurological deficits. Extremities: Very minimal erythema left lower extremity. Psychiatry: Judgement and insight appear normal. Mood & affect appropriate.   Data Reviewed: I have personally reviewed following labs and imaging studies  CBC: Recent Labs  Lab 10/20/22 0927 10/21/22 0638 10/22/22 0146 10/23/22 0150 10/24/22 0603  WBC 21.4* 15.3* 13.5* 13.3* 15.4*  NEUTROABS 20.1*  --  11.7* 11.4* 10.1*  HGB 10.4* 8.6* 8.1* 8.6* 8.3*  HCT 30.2* 24.5* 23.1* 23.9* 22.8*  MCV 88.6 86.9 85.9 84.8 83.8  PLT 201 154 135* 134* 154    Basic Metabolic Panel: Recent Labs   Lab 10/20/22 0927 10/20/22 1812 10/21/22 0211 10/21/22 4098 10/22/22 0146 10/23/22 0150 10/24/22 0603  NA 125*   < > 127* 126* 125* 126* 128*  K 4.2   < > 3.8 3.9 3.6 3.5 3.2*  CL 91*   < > 94* 94* 95* 93* 93*  CO2 22   < > 20* 18* 18* 20* 21*  GLUCOSE 64*   < > 100* 81 107* 107* 98  BUN 61*   < > 50* 53* 51* 51* 43*  CREATININE 1.31*   < > 1.20* 1.29* 1.31* 1.27* 1.17*  CALCIUM 8.4*   < > 7.8* 7.7* 7.4* 7.9* 8.0*  MG 1.6*  --   --  2.1  --   --   --    < > = values in this interval not displayed.    GFR: Estimated Creatinine Clearance: 29.3 mL/min (A) (by C-G formula based on SCr of 1.17 mg/dL (H)). Liver Function Tests: No results for input(s): "AST", "ALT", "ALKPHOS", "BILITOT", "PROT", "ALBUMIN" in the last 168 hours. No results for input(s): "LIPASE", "AMYLASE" in the last 168 hours. No results for input(s): "AMMONIA" in the last 168 hours. Coagulation Profile: No results for input(s): "INR", "PROTIME" in the last 168 hours. Cardiac Enzymes: No results for input(s): "CKTOTAL", "CKMB", "CKMBINDEX", "TROPONINI" in the last 168 hours. BNP (last 3 results) No results for input(s): "PROBNP" in the last 8760 hours. HbA1C: No results for input(s): "HGBA1C" in the last 72 hours. CBG: Recent Labs  Lab 10/21/22 1145  GLUCAP 97    Lipid Profile: No results for input(s): "CHOL", "HDL", "LDLCALC", "TRIG", "CHOLHDL", "LDLDIRECT" in the last 72 hours. Thyroid Function Tests: No results for input(s): "TSH", "T4TOTAL", "FREET4", "T3FREE", "THYROIDAB" in the last 72 hours. Anemia Panel: No results for input(s): "VITAMINB12", "FOLATE", "FERRITIN", "TIBC", "IRON", "RETICCTPCT" in the last 72 hours. Sepsis Labs: Recent Labs  Lab 10/20/22 1045 10/20/22 1812 10/21/22 0638  PROCALCITON  --  7.98 7.41  LATICACIDVEN 1.6  --   --      Recent Results (from the past 240 hour(s))  SARS Coronavirus 2 by RT PCR (hospital order, performed in [facility] hospital lab) *cepheid single  result test* Anterior Nasal Swab     Status: None   Collection Time: 10/20/22  9:21 AM   Specimen: Anterior Nasal Swab  Result Value Ref Range Status   SARS Coronavirus 2 by RT PCR NEGATIVE NEGATIVE Final  Comment: Performed at [facility], [address] [place], [place] [address]  Blood culture (routine x 2)     Status: Abnormal (Preliminary result)   Collection Time: 10/20/22 10:45 AM   Specimen: BLOOD RIGHT ARM  Result Value Ref Range Status   Specimen Description BLOOD RIGHT ARM  Final   Special Requests   Final    BOTTLES DRAWN AEROBIC AND ANAEROBIC Blood Culture adequate volume   Culture  Setup Time   Final    GRAM NEGATIVE RODS AEROBIC BOTTLE ONLY CRITICAL RESULT CALLED TO, READ BACK BY AND VERIFIED WITH: PHARMD M. MITCHELL [id number] FCP    Culture (A)  Final    HAEMOPHILUS INFLUENZAE BETA LACTAMASE POSITIVE HEALTH DEPARTMENT NOTIFIED Referred to [facility] in [place], [place] for serotyping. Performed at [facility], [address] [place], [place] [address]    Report Status PENDING  Incomplete  Blood Culture ID Panel (Reflexed)     Status: Abnormal   Collection Time: 10/20/22 10:45 AM  Result Value Ref Range Status   Enterococcus faecalis NOT DETECTED NOT DETECTED Final   Enterococcus Faecium NOT DETECTED NOT DETECTED Final   Listeria monocytogenes NOT DETECTED NOT DETECTED Final   Staphylococcus species NOT DETECTED NOT DETECTED Final   Staphylococcus aureus (BCID) NOT DETECTED NOT DETECTED Final   Staphylococcus epidermidis NOT DETECTED NOT DETECTED Final   Staphylococcus lugdunensis NOT DETECTED NOT DETECTED Final   Streptococcus species NOT DETECTED NOT DETECTED Final   Streptococcus agalactiae NOT DETECTED NOT DETECTED Final   Streptococcus pneumoniae NOT DETECTED NOT DETECTED Final   Streptococcus pyogenes NOT DETECTED NOT DETECTED Final   A.calcoaceticus-baumannii NOT DETECTED NOT DETECTED Final   Bacteroides fragilis NOT DETECTED NOT  DETECTED Final   Enterobacterales NOT DETECTED NOT DETECTED Final   Enterobacter cloacae complex NOT DETECTED NOT DETECTED Final   Escherichia coli NOT DETECTED NOT DETECTED Final   Klebsiella aerogenes NOT DETECTED NOT DETECTED Final   Klebsiella oxytoca NOT DETECTED NOT DETECTED Final   Klebsiella pneumoniae NOT DETECTED NOT DETECTED Final   Proteus species NOT DETECTED NOT DETECTED Final   Salmonella species NOT DETECTED NOT DETECTED Final   Serratia marcescens NOT DETECTED NOT DETECTED Final   Haemophilus influenzae DETECTED (A) NOT DETECTED Final    Comment: CRITICAL RESULT CALLED TO, READ BACK BY AND VERIFIED WITH: Darci Current MITCHEL [id number] BY FCP    Neisseria meningitidis NOT DETECTED NOT DETECTED Final   Pseudomonas aeruginosa NOT DETECTED NOT DETECTED Final   Stenotrophomonas maltophilia NOT DETECTED NOT DETECTED Final   Candida albicans NOT DETECTED NOT DETECTED Final   Candida auris NOT DETECTED NOT DETECTED Final   Candida glabrata NOT DETECTED NOT DETECTED Final   Candida krusei NOT DETECTED NOT DETECTED Final   Candida parapsilosis NOT DETECTED NOT DETECTED Final   Candida tropicalis NOT DETECTED NOT DETECTED Final   Cryptococcus neoformans/gattii NOT DETECTED NOT DETECTED Final    Comment: Performed at [facility], [address], [place], [place] [address]  Blood culture (routine x 2)     Status: None (Preliminary result)   Collection Time: 10/20/22  6:09 PM   Specimen: BLOOD  Result Value Ref Range Status   Specimen Description BLOOD SITE NOT SPECIFIED  Final   Special Requests   Final    BOTTLES DRAWN AEROBIC ONLY Blood Culture adequate volume   Culture   Final    NO GROWTH 4 DAYS Performed at [facility],  [address] [place], [place] [address]    Report Status PENDING  Incomplete     Radiology Studies: No results found.  Scheduled Meds:  [START ON 10/25/2022] amoxicillin-clavulanate  1 tablet Oral Q12H   aspirin EC  81 mg Oral  Daily   bisacodyl  10 mg Rectal Once   enalapril  10 mg Oral Daily   enoxaparin (LOVENOX) injection  30 mg Subcutaneous Q24H   hydrochlorothiazide  25 mg Oral Daily   levothyroxine  88 mcg Oral Q0600   metoprolol tartrate  25 mg Oral QHS   potassium chloride  40 mEq Oral Q4H   simvastatin  20 mg Oral QPM   sodium chloride flush  3 mL Intravenous Q12H   Continuous Infusions:     LOS: 3 days   Hughie Closs, MD [facility]  10/24/2022, 11:39 AM   *Please note that this is a verbal dictation therefore any spelling or grammatical errors are due to the "[employer] One" system interpretation.  Please page via Amion and do not message via secure chat for urgent patient care matters. Secure chat can be used for non urgent patient care matters.  How to contact the [facility] Attending or Consulting provider 7A - 7P or covering provider during after hours 7P -7A, for this patient?  Check the care team in [facility] and look for a) attending/consulting TR[facility] provider listed and b) the [facility] team listed. Page or secure chat 7A-7P. Log into [email or web address] and use [place]'s universal password to access. If you do not have the password, please contact the hospital operator. Locate the [facility] provider you are looking for under [facility] and page to a number that you can be directly reached. If you still have difficulty reaching the provider, please page the [facility] (Director on Call) for the Hospitalists listed on amion for assistance.

## 2022-10-24 NOTE — Care Management Important Message (Signed)
Important Message  Patient Details  Name: Kathleen Paul MRN: [id number] Date of Birth: Oct 08, 1935   Medicare Important Message Given:  Yes     Renie Ora 10/24/2022, 4:26 PM

## 2022-10-24 NOTE — TOC Progression Note (Addendum)
Transition of Care [facility]) - Progression Note    Patient Details  Name: Kathleen Paul MRN: [id number] Date of Birth: 1936-03-19  Transition of Care [facility]) CM/SW Contact  Eduard Roux, [place] Phone Number: 10/24/2022, 11:42 AM  Clinical Narrative:     Update: [facility] currently has no availability -waiting on family to make another SNF choice  CSW met with patient and her daughter, [name]. CSW updated on the SNF process and provided bed offers w/Medicare.gov ratings. CSW advised to call with top two SNF choices.   [facility] is the preferred SN[facility]. CSW contacted [employer]- waiting on response.   Antony Blackbird, MSW, LCSW Clinical Social Worker    Expected Discharge Plan:  (TBD) Barriers to Discharge: Continued Medical Work up  Expected Discharge Plan and Services In-house Referral: Clinical Social Work     Living arrangements for the past 2 months: Single Family Home                                       Social Determinants of Health (SDOH) Interventions SDOH Screenings   Food Insecurity: No Food Insecurity (10/20/2022)  Housing: Low Risk  (10/20/2022)  Transportation Needs: No Transportation Needs (10/20/2022)  Utilities: Not At Risk (10/20/2022)  Alcohol Screen: Low Risk  (12/14/2021)  Depression (PHQ2-9): Low Risk  (12/14/2021)  Financial Resource Strain: Low Risk  (12/14/2021)  Physical Activity: Sufficiently Active (12/14/2021)  Social Connections: Moderately Isolated (12/14/2021)  Stress: No Stress Concern Present (12/14/2021)  Tobacco Use: Medium Risk (10/20/2022)    Readmission Risk Interventions     No data to display

## 2022-10-25 DIAGNOSIS — J189 Pneumonia, unspecified organism: Secondary | ICD-10-CM | POA: Diagnosis not present

## 2022-10-25 LAB — CBC WITH DIFFERENTIAL/PLATELET
Abs Immature Granulocytes: 1.03 10*3/uL — ABNORMAL HIGH (ref 0.00–0.07)
Basophils Absolute: 0 10*3/uL (ref 0.0–0.1)
Basophils Relative: 0 %
Eosinophils Absolute: 0 10*3/uL (ref 0.0–0.5)
Eosinophils Relative: 0 %
HCT: 27.4 % — ABNORMAL LOW (ref 36.0–46.0)
Hemoglobin: 9.3 g/dL — ABNORMAL LOW (ref 12.0–15.0)
Immature Granulocytes: 10 %
Lymphocytes Relative: 6 %
Lymphs Abs: 0.6 10*3/uL — ABNORMAL LOW (ref 0.7–4.0)
MCH: 29.8 pg (ref 26.0–34.0)
MCHC: 33.9 g/dL (ref 30.0–36.0)
MCV: 87.8 fL (ref 80.0–100.0)
Monocytes Absolute: 2.4 10*3/uL — ABNORMAL HIGH (ref 0.1–1.0)
Monocytes Relative: 24 %
Neutro Abs: 6 10*3/uL (ref 1.7–7.7)
Neutrophils Relative %: 60 %
Platelets: 175 10*3/uL (ref 150–400)
RBC: 3.12 MIL/uL — ABNORMAL LOW (ref 3.87–5.11)
RDW: 14.8 % (ref 11.5–15.5)
WBC: 10.1 10*3/uL (ref 4.0–10.5)
nRBC: 0 % (ref 0.0–0.2)

## 2022-10-25 LAB — CULTURE, BLOOD (ROUTINE X 2): Culture: NO GROWTH

## 2022-10-25 LAB — BASIC METABOLIC PANEL
Anion gap: 12 (ref 5–15)
BUN: 32 mg/dL — ABNORMAL HIGH (ref 8–23)
CO2: 21 mmol/L — ABNORMAL LOW (ref 22–32)
Calcium: 8.2 mg/dL — ABNORMAL LOW (ref 8.9–10.3)
Chloride: 97 mmol/L — ABNORMAL LOW (ref 98–111)
Creatinine, Ser: 0.97 mg/dL (ref 0.44–1.00)
GFR, Estimated: 58 mL/min — ABNORMAL LOW (ref >60)
Glucose, Bld: 76 mg/dL (ref 70–99)
Potassium: 4.2 mmol/L (ref 3.5–5.1)
Sodium: 130 mmol/L — ABNORMAL LOW (ref 135–145)

## 2022-10-25 NOTE — Progress Notes (Signed)
10/25/22 1257  PT Visit Information  Last PT Received On 10/25/22  Assistance Needed +2  History of Present Illness Pt is an 87 y.o. female who presented 10/20/22 with generalized weakness, cough, and abdominal pain. Pt admitted with sepsis secondary to CAP, L lower extremity cellulitis, and acute hyponatremia. PMH: TIA, CAD, CKD 3A, HTN, HLD, GERD, obesity, hypothyroidism, anxiety, DVT, cancer, scoliosis.  Subjective Data  Patient Stated Goal to reduce pain  Precautions  Precautions Fall  Precaution Comments urinary incontinence  Restrictions  Weight Bearing Restrictions No  Pain Assessment  Pain Assessment Faces  Faces Pain Scale 8  Pain Location Shoulders, left knee  Pain Descriptors / Indicators Guarding;Grimacing;Discomfort  Pain Intervention(s) Limited activity within patient's tolerance;Monitored during session;Repositioned  Cognition  Arousal/Alertness Awake/alert  Behavior During Therapy Anxious;WFL for tasks assessed/performed  Overall Cognitive Status Within Functional Limits for tasks assessed  General Comments Pt anxious in regards to pain but overall pleasant and appreciative. Follows commands appropriately  Transfers  General transfer comment Pt in chair  Ambulation/Gait  General Gait Details unable at this time  Exercises  Exercises Other exercises  Other Exercises  Other Exercises Educated pt and daughter regarding [employer] program [phone] for right anterior canal BPPV. Handouts include right self Epley, Austin Miles, explanation of BPPV and rolling left and right Habituation. Pt and daughter educated regarding BPPV as well as reviewed exercises.  PT - End of Session  Equipment Utilized During Treatment Gait belt  Activity Tolerance Patient tolerated treatment well;Patient limited by pain;Patient limited by fatigue  Patient left in chair;with call bell/phone within reach;with chair alarm set;with family/visitor present  Nurse Communication Mobility status;Need  for lift equipment   PT - Assessment/Plan  PT Plan Current plan remains appropriate  PT Visit Diagnosis Muscle weakness (generalized) (M62.81);Difficulty in walking, not elsewhere classified (R26.2);Pain  Pain - Right/Left  (bil)  Pain - part of body Shoulder;Leg  PT Frequency (ACUTE ONLY) Min 1X/week  Follow Up Recommendations Skilled nursing-short term rehab (<3 hours/day)  Can patient physically be transported by private vehicle No  Assistance recommended at discharge Frequent or constant Supervision/Assistance  Patient can return home with the following Two people to help with walking and/or transfers;Assistance with cooking/housework;Assist for transportation;Help with stairs or ramp for entrance;Direct supervision/assist for medications management;Two people to help with bathing/dressing/bathroom  PT equipment BSC/3in1;Wheelchair (measurements PT);Wheelchair cushion (measurements PT);Other (comment) (consider mechanical lift and hospital bed pending progress; currently needing these)  AM-PAC PT "6 Clicks" Mobility Outcome Measure (Version 2)  Help needed turning from your back to your side while in a flat bed without using bedrails? 2  Help needed moving from lying on your back to sitting on the side of a flat bed without using bedrails? 2  Help needed moving to and from a bed to a chair (including a wheelchair)? 2  Help needed standing up from a chair using your arms (e.g., wheelchair or bedside chair)? 2  Help needed to walk in hospital room? 1  Help needed climbing 3-5 steps with a railing?  1  6 Click Score 10  Consider Recommendation of Discharge To: CIR/SNF/LTACH  Progressive Mobility  What is the highest level of mobility based on the progressive mobility assessment? Level 2 (Chairfast) - Balance while sitting on edge of bed and cannot stand  Activity Repositioned in chair  PT Goal Progression  Progress towards PT goals Progressing toward goals  PT Time Calculation  PT Start  Time (ACUTE ONLY) 1118  PT Stop Time (ACUTE ONLY)  1135  PT Time Calculation (min) (ACUTE ONLY) 17 min  PT General Charges  $$ ACUTE PT VISIT 1 Visit  PT Treatments  $Therapeutic Exercise 8-22 mins   Provided education and handout to pt and family regarding BPPV and exercises.  Will follow up tomorrow and continue to progress pt as pt tolerates.  Wei Newbrough M,PT Acute Rehab Services [phone]

## 2022-10-25 NOTE — TOC Progression Note (Signed)
Transition of Care [facility]) - Progression Note    Patient Details  Name: Kathleen Paul MRN: [id number] Date of Birth: 1935-08-15  Transition of Care [facility]) CM/SW Contact  Kathleen Paul, [place] Phone Number: 10/25/2022, 12:43 PM  Clinical Narrative:     Spoke with patient's son- informed need SNF choice as soon as possible. He states he si working and has a meeting to talk with his sister [name], that is in the room with patient. CSW will follow up for choice.  Antony Blackbird, MSW, LCSW Clinical Social Worker    Expected Discharge Plan: Skilled Nursing Facility Barriers to Discharge:  (family - snf choice)  Expected Discharge Plan and Services In-house Referral: Clinical Social Work     Living arrangements for the past 2 months: Single Family Home                                       Social Determinants of Health (SDOH) Interventions SDOH Screenings   Food Insecurity: No Food Insecurity (10/20/2022)  Housing: Low Risk  (10/20/2022)  Transportation Needs: No Transportation Needs (10/20/2022)  Utilities: Not At Risk (10/20/2022)  Alcohol Screen: Low Risk  (12/14/2021)  Depression (PHQ2-9): Low Risk  (12/14/2021)  Financial Resource Strain: Low Risk  (12/14/2021)  Physical Activity: Sufficiently Active (12/14/2021)  Social Connections: Moderately Isolated (12/14/2021)  Stress: No Stress Concern Present (12/14/2021)  Tobacco Use: Medium Risk (10/20/2022)    Readmission Risk Interventions     No data to display

## 2022-10-25 NOTE — TOC Progression Note (Addendum)
Transition of Care [facility]) - Progression Note    Patient Details  Name: Kathleen Paul MRN: [id number] Date of Birth: 1936-03-14  Transition of Care [facility]) CM/SW Contact  Eduard Roux, [place] Phone Number: 10/25/2022, 11:47 AM  Clinical Narrative:    Received insurance authorization for SNF # [id number] PTAR [phone] - will need to informed HTA of SNF choice once family has decided.  Called patient's son, Jonny Ruiz, daughter [name]- no answer- was able to speak with daughter, [name]- informed needs SNF choice.  She advised the family planned to visit [facility]- CSW explained, [facility] is private pay, will need to select a SNF that has offered.    Antony Blackbird, MSW, LCSW Clinical Social Worker     Expected Discharge Plan: Skilled Nursing Facility Barriers to Discharge:  (family - snf choice)  Expected Discharge Plan and Services In-house Referral: Clinical Social Work     Living arrangements for the past 2 months: Single Family Home                                       Social Determinants of Health (SDOH) Interventions SDOH Screenings   Food Insecurity: No Food Insecurity (10/20/2022)  Housing: Low Risk  (10/20/2022)  Transportation Needs: No Transportation Needs (10/20/2022)  Utilities: Not At Risk (10/20/2022)  Alcohol Screen: Low Risk  (12/14/2021)  Depression (PHQ2-9): Low Risk  (12/14/2021)  Financial Resource Strain: Low Risk  (12/14/2021)  Physical Activity: Sufficiently Active (12/14/2021)  Social Connections: Moderately Isolated (12/14/2021)  Stress: No Stress Concern Present (12/14/2021)  Tobacco Use: Medium Risk (10/20/2022)    Readmission Risk Interventions     No data to display

## 2022-10-25 NOTE — Consult Note (Signed)
   [facility] Inpatient Consult   10/25/2022  Kathleen Paul 09/02/1935 [id number]  [facility] [[facility]]  Accountable Care Organization [ACO] Patient: [employer]  Primary Care Provider:  Doreene Nest, NP, [place] [facility] which is listed to provide the Transition of Care follow up   Patient screened for rounds for hospitalization with noted medium risk score for unplanned readmission risk 5 days length of stay and to assess for potential [facility]  [[facility]] Care Management service needs for post hospital transition for care coordination.  Review of patient's electronic medical record reveals patient is currently being evaluated for a skilled nursing facility level of care for post hospital needs.  Patient will need choice and will need insurance authorization noted as reviewed in inpatient [facility] team notes working with family.   Plan:  Continue to follow progress and disposition to assess for post hospital community care coordination/management needs.  Referral request for community care coordination:  awaiting disposition, if patient goes to SNF needs for post hospital care is to be met at that level of care.  Of note, [facility] Care Management/Population Health does not replace or interfere with any arrangements made by the Inpatient Transition of Care team.  For questions contact:   Charlesetta Shanks, RN BSN CCM [facility]  [phone] business mobile phone Toll free office [phone]  *Concierge Line  [phone] Fax number: [phone] [email or web address]

## 2022-10-25 NOTE — TOC Progression Note (Signed)
Transition of Care [facility]) - Progression Note    Patient Details  Name: Kathleen Paul MRN: [id number] Date of Birth: Mar 09, 1936  Transition of Care [facility]) CM/SW Contact  Eduard Roux, [place] Phone Number: 10/25/2022, 1:54 PM  Clinical Narrative:     [facility] confirmed, thay can admit tomorrow   Expected Discharge Plan: Skilled Nursing Facility   Expected Discharge Plan and Services In-house Referral: Clinical Social Work     Living arrangements for the past 2 months: Single Family Home                                       Social Determinants of Health (SDOH) Interventions SDOH Screenings   Food Insecurity: No Food Insecurity (10/20/2022)  Housing: Low Risk  (10/20/2022)  Transportation Needs: No Transportation Needs (10/20/2022)  Utilities: Not At Risk (10/20/2022)  Alcohol Screen: Low Risk  (12/14/2021)  Depression (PHQ2-9): Low Risk  (12/14/2021)  Financial Resource Strain: Low Risk  (12/14/2021)  Physical Activity: Sufficiently Active (12/14/2021)  Social Connections: Moderately Isolated (12/14/2021)  Stress: No Stress Concern Present (12/14/2021)  Tobacco Use: Medium Risk (10/20/2022)    Readmission Risk Interventions     No data to display

## 2022-10-25 NOTE — Progress Notes (Signed)
Mobility Specialist: Progress Note   10/25/22 1240  Mobility  Activity Transferred from chair to bed  Level of Assistance +2 (takes two people)  Assistive Device MaxiMove  Activity Response Tolerated well  Mobility Referral Yes  $Mobility charge 1 Mobility   Pt assisted back to bed per request. C/o general pain during transfer from sitting in the chair. No c/o dizziness or SOB. Pt is back in bed with call bell and phone at her side. Pt requesting pain medication, RN notified.   Kathleen Paul Mobility Specialist Please contact via SecureChat or Rehab office at [phone]

## 2022-10-25 NOTE — Progress Notes (Signed)
Physical Therapy Treatment Patient Details Name: Kathleen Paul MRN: [id number] DOB: [birth year] Today's Date: 10/25/2022   History of Present Illness Pt is an 87 y.o. female who presented 10/20/22 with generalized weakness, cough, and abdominal pain. Pt admitted with sepsis secondary to CAP, L lower extremity cellulitis, and acute hyponatremia. PMH: TIA, CAD, CKD 3A, HTN, HLD, GERD, obesity, hypothyroidism, anxiety, DVT, cancer, scoliosis.    PT Comments    Pt admitted with above diagnosis. Pt was able to tolerate Epley maneuver for right anterior canal BPPV.  Pt reports decr dizziness after treatment and was left up in chair.  Pt reports less dizziness.   Pt currently with functional limitations due to balance and endurance deficits. Pt will benefit from acute skilled PT to increase their independence and safety with mobility to allow discharge.      Recommendations for follow up therapy are one component of a multi-disciplinary discharge planning process, led by the attending physician.  Recommendations may be updated based on patient status, additional functional criteria and insurance authorization.  Follow Up Recommendations  Can patient physically be transported by private vehicle: No    Assistance Recommended at Discharge Frequent or constant Supervision/Assistance  Patient can return home with the following Two people to help with walking and/or transfers;Assistance with cooking/housework;Assist for transportation;Help with stairs or ramp for entrance;Direct supervision/assist for medications management;Two people to help with bathing/dressing/bathroom   Equipment Recommendations  BSC/3in1;Wheelchair (measurements PT);Wheelchair cushion (measurements PT);Other (comment) (consider mechanical lift and hospital bed pending progress; currently needing these)    Recommendations for Other Services       Precautions / Restrictions Precautions Precautions: Fall Precaution Comments:  urinary incontinence Restrictions Weight Bearing Restrictions: No     Mobility  Bed Mobility Overal bed mobility: Needs Assistance Bed Mobility: Supine to Sit, Sit to Supine Rolling: Mod assist Sidelying to sit: Max assist       General bed mobility comments: Pt needing multimodal cues for RLE placement and RUE assist for rolling toward her L side, once on her side needing maxA to advance BLE over EOB and  max assist for trunk raising to sit EOB and for bil hip scooting to foot flat with bed pad assist.    Transfers Overall transfer level: Needs assistance   Transfers: Sit to/from Stand Sit to Stand: Mod assist, From elevated surface           General transfer comment: Pt needed mod assist to power up to [facility] with flexed trunk and inability to stand fully upright but was able to get the buttock pads behind the pt for her to sit.  Pt continues to need incr cues for  posture Pt attempting to sit prior to reaching . Lift pad in chair under her so nursing staff can use Maximove lift for return transfer safety later in the day. Transfer via Lift Equipment: Stedy  Ambulation/Gait               General Gait Details: unable at this time   Social research officer, government Rankin (Stroke Patients Only)       Balance Overall balance assessment: Needs assistance Sitting-balance support: Feet supported, Bilateral upper extremity supported, No upper extremity supported Sitting balance-Leahy Scale: Fair Sitting balance - Comments: Able to sit statically with min guard assist   Standing balance support: Bilateral upper extremity supported, During functional activity, Reliant on assistive device for balance Standing balance-Leahy  Scale: Poor Standing balance comment: Mod assist for static standing progressing to max A with fatigue                            Cognition Arousal/Alertness: Awake/alert Behavior During Therapy: Anxious, WFL  for tasks assessed/performed Overall Cognitive Status: Within Functional Limits for tasks assessed                                 General Comments: Pt anxious in regards to pain but overall pleasant and appreciative. Follows commands appropriately        Exercises      General Comments General comments (skin integrity, edema, etc.): Pt with positive right anterior canal BPPV per Gilberto Better therefore treated pt with Epley maneuver with pt dizziness decreasing from 8/10 to 4/10.      Pertinent Vitals/Pain Pain Assessment Pain Assessment: Faces Faces Pain Scale: Hurts whole lot Pain Location: Shoulders, left knee Pain Descriptors / Indicators: Guarding, Grimacing, Discomfort Pain Intervention(s): Limited activity within patient's tolerance, Monitored during session, Repositioned (rubbed muscle rub on left knee)    Home Living                          Prior Function            PT Goals (current goals can now be found in the care plan section) Acute Rehab PT Goals Patient Stated Goal: to reduce pain Progress towards PT goals: Progressing toward goals    Frequency    Min 1X/week      PT Plan Current plan remains appropriate    Co-evaluation              AM-PAC PT "6 Clicks" Mobility   Outcome Measure  Help needed turning from your back to your side while in a flat bed without using bedrails?: A Lot Help needed moving from lying on your back to sitting on the side of a flat bed without using bedrails?: A Lot Help needed moving to and from a bed to a chair (including a wheelchair)?: A Lot Help needed standing up from a chair using your arms (e.g., wheelchair or bedside chair)?: A Lot Help needed to walk in hospital room?: Total Help needed climbing 3-5 steps with a railing? : Total 6 Click Score: 10    End of Session Equipment Utilized During Treatment: Gait belt Activity Tolerance: Patient tolerated treatment well;Patient limited  by pain Patient left: in chair;with call bell/phone within reach;with chair alarm set;with family/visitor present Nurse Communication: Mobility status;Need for lift equipment PT Visit Diagnosis: Muscle weakness (generalized) (M62.81);Difficulty in walking, not elsewhere classified (R26.2);Pain Pain - Right/Left:  (bil) Pain - part of body: Shoulder;Leg     Time: 1610-9604 PT Time Calculation (min) (ACUTE ONLY): 39 min  Charges:  $Therapeutic Activity: 23-37 mins $Canalith Rep Proc: 8-22 mins                     Yisel Megill M,PT Acute Rehab Services [phone]    Bevelyn Buckles 10/25/2022, 12:50 PM

## 2022-10-25 NOTE — Progress Notes (Signed)
PROGRESS NOTE    Kathleen Paul  WUJ:[id number] DOB: August 14, 1935 DOA: 10/20/2022 PCP: Doreene Nest, NP   Brief Narrative:  Kathleen Paul is a 87 y.o. female with medical history significant of TIA, CAD, CKD 3A, hypertension, hyperlipidemia, GERD, obesity, hypothyroidism, anxiety, DVT presented with generalized weakness, cough, abdominal pain since about 2 weeks.   She did see her PCP for virtual visit and was prescribed a course of prednisone about a week ago, which she has since completed and then prescribed azithromycin after she failed to improve on the prednisone.  She has chronic bilateral shoulder and left knee pain.  However it is worse after her ambulance ride.  She also developed left lower extremity redness on the morning of admission.  No fever.  Upon arrival to ED, she was slightly tachycardic.  Significant leukocytosis.  Slightly elevated troponin.  Elevated D-dimer followed by CTA negative for PE but positive for pneumonia.  CT abdomen pelvis showed leukocytosis but no other acute pathology.  She received Rocephin and doxycycline and was admitted to hospital service.  Details below.  Assessment & Plan:   Principal Problem:   CAP (community acquired pneumonia) Active Problems:   TIA (transient ischemic attack)   Hypertension   Dyslipidemia   CAD (coronary artery disease)   Hypothyroidism   Gastroesophageal reflux disease   Chronic kidney disease, stage 3a   GAD (generalized anxiety disorder)   Cellulitis   Hyponatremia   Sepsis  Sepsis secondary to community-acquired pneumonia, haemophilus Influenza bacteremia POA: Meets criteria for sepsis based on tachycardia and leukocytosis with a source as pneumonia and left lower extremity cellulitis.  CTA negative for PE.  She is not hypoxic. blood cultures growing H. influenzae.  Will need 7 days of therapy.  Transitioned to Augmentin today for 4 days which will end tomorrow evening.  Left lower extremity cellulitis: No  reports of trauma.  Erythema almost resolved.  Transitioned to Augmentin   Acute hyponatremia: Presented with 125, given normal saline in the beginning.  Labs argue against SIADH.  Will monitor for now.  She is asymptomatic.  Currently 130.  Hypomagnesemia: Replaced and resolved.  Hypokalemia: Resolved.  CKD 3a: At baseline.  Right inguinal pain / Generalized pain > Has had some ongoing generalized pain, Additionally reports specific right inguinal pain. Nothing on imaging to indicate abnormality at the right inguinal area.  Unclear if this could be related to her neurogenic source considering her levo scoliosis noted on CT. continue oral Dilaudid.   Essential hypertension -Pressure very well-controlled, continue home enalapril, increased dose of metoprolol (previously was on 50 mg once daily, increased frequency to 50 mg twice daily due to having SVTs).   History of TIA > Last year had 3-hour episode of aphasia and diagnosed with TIA.  Noted to have severe vertebral artery stenosis on the right but not intervened upon.  Completed 3 months of Plavix now on aspirin alone. - Continue home aspirin, simvastatin   CAD Hyperlipidemia - Continue home simvastatin   Hypothyroidism - Continue on Synthroid   Obesity - Noted  Generalized weakness and bilateral chronic shoulder pain secondary to severe arthritis: Patient and daughters tell me that she has chronic severe pain in bilateral shoulders due to severe osteoarthritis but this has been worse lately.  She has been told that surgery is not an option for her since this is advanced arthritis.  Seen by PT OT, SNF recommended.   SVT: Patient had an episode of SVT on the afternoon  of 10/21/2022.  She was given IV Lopressor.  SVT resolved.  Her Lopressor which she was taking only once daily, increased to 50 mg p.o. twice daily.  She had another episode of type II heart block which lasted for 20 seconds and she was asymptomatic.  No issues with the  rhythm since 10/22/2022.  Constipation: Resolved with Dulcolax suppository.  Disposition: PT OT recommended SNF.  Finally family has picked a place and insurance has been authorized however family is requesting 1 more day today so that they can prepare a few things and they are requesting discharge tomorrow.  TOC has been made aware.  Patient will also complete antibiotics tomorrow so will not need further antibiotics at discharge.  DVT prophylaxis: enoxaparin (LOVENOX) injection 30 mg Start: 10/20/22 1400   Code Status: DNR  Family Communication: Daughterpresent at the bedside.  Plan of care discussed with them in length.  They verbalized understanding.    Status is: Inpatient Remains inpatient appropriate because: Medically stable.  Keeping another night in the hospital per family's request.  Estimated body mass index is 28.94 kg/m as calculated from the following:   Height as of this encounter: 4\' 11"  (1.499 m).   Weight as of this encounter: 65 kg.    Nutritional Assessment: Body mass index is 28.94 kg/m.Marland Kitchen Seen by dietician.  I agree with the assessment and plan as outlined below: Nutrition Status:        . Skin Assessment: I have examined the patient's skin and I agree with the wound assessment as performed by the wound care RN as outlined below:    Consultants:  None  Procedures:  None  Antimicrobials:  Anti-infectives (From admission, onward)    Start     Dose/Rate Route Frequency Ordered Stop   10/25/22 1000  amoxicillin-clavulanate (AUGMENTIN) 875-125 MG per tablet 1 tablet        1 tablet Oral Every 12 hours 10/24/22 1101 10/27/22 0959   10/21/22 1800  doxycycline (VIBRA-TABS) tablet 100 mg  Status:  Discontinued        100 mg Oral Every 12 hours 10/21/22 1236 10/23/22 1137   10/21/22 1000  cefTRIAXone (ROCEPHIN) 2 g in sodium chloride 0.9 % 100 mL IVPB  Status:  Discontinued        2 g 200 mL/hr over 30 Minutes Intravenous Every 24 hours 10/20/22 1358  10/24/22 1101   10/21/22 0700  doxycycline (VIBRAMYCIN) 100 mg in sodium chloride 0.9 % 250 mL IVPB  Status:  Discontinued        100 mg 125 mL/hr over 120 Minutes Intravenous 2 times daily 10/21/22 0615 10/21/22 1236   10/20/22 1030  cefTRIAXone (ROCEPHIN) 2 g in sodium chloride 0.9 % 100 mL IVPB        2 g 200 mL/hr over 30 Minutes Intravenous  Once 10/20/22 1020 10/20/22 1157   10/20/22 1030  doxycycline (VIBRAMYCIN) 100 mg in sodium chloride 0.9 % 250 mL IVPB        100 mg 125 mL/hr over 120 Minutes Intravenous  Once 10/20/22 1020 10/20/22 1444         Subjective: Patient seen and examined.  Daughter at the bedside.  Patient yet again complains of generalized body ache and bilateral shoulder pain which are chronic complaints.  No other complaint.  She had 3 bowel movement yesterday after receiving Dulcolax suppository.  She is happy about that.  Objective: Vitals:   10/24/22 1144 10/24/22 1525 10/24/22 2026 10/24/22 2352  BP: Marland Kitchen)  151/68 129/66 116/67 138/71  Pulse: 84 92 92 86  Resp: 12 17 20 16   Temp: 97.6 F (36.4 C) 97.9 F (36.6 C) 97.7 F (36.5 C) 98.4 F (36.9 C)  TempSrc: Oral Oral Oral Oral  SpO2: 100% 95% 100% 98%  Weight:      Height:        Intake/Output Summary (Last 24 hours) at 10/25/2022 1402 Last data filed at 10/25/2022 1043 Gross per 24 hour  Intake 240 ml  Output 1400 ml  Net -1160 ml    Filed Weights   10/22/22 2204  Weight: 65 kg    Examination:  General exam: Appears calm and comfortable  Respiratory system: Rhonchi bilaterally, improving.  Respiratory effort normal. Cardiovascular system: S1 & S2 heard, RRR. No JVD, murmurs, rubs, gallops or clicks. No pedal edema. Gastrointestinal system: Abdomen is nondistended, soft and nontender. No organomegaly or masses felt. Normal bowel sounds heard. Central nervous system: Alert and oriented. No focal neurological deficits. Extremities: Very minimal erythema left lower extremity. Psychiatry:  Judgement and insight appear normal. Mood & affect appropriate.   Data Reviewed: I have personally reviewed following labs and imaging studies  CBC: Recent Labs  Lab 10/20/22 0927 10/21/22 0638 10/22/22 0146 10/23/22 0150 10/24/22 0603 10/25/22 0850  WBC 21.4* 15.3* 13.5* 13.3* 15.4* 10.1  NEUTROABS 20.1*  --  11.7* 11.4* 10.1* 6.0  HGB 10.4* 8.6* 8.1* 8.6* 8.3* 9.3*  HCT 30.2* 24.5* 23.1* 23.9* 22.8* 27.4*  MCV 88.6 86.9 85.9 84.8 83.8 87.8  PLT 201 154 135* 134* 154 175    Basic Metabolic Panel: Recent Labs  Lab 10/20/22 0927 10/20/22 1812 10/21/22 0638 10/22/22 0146 10/23/22 0150 10/24/22 0603 10/25/22 0850  NA 125*   < > 126* 125* 126* 128* 130*  K 4.2   < > 3.9 3.6 3.5 3.2* 4.2  CL 91*   < > 94* 95* 93* 93* 97*  CO2 22   < > 18* 18* 20* 21* 21*  GLUCOSE 64*   < > 81 107* 107* 98 76  BUN 61*   < > 53* 51* 51* 43* 32*  CREATININE 1.31*   < > 1.29* 1.31* 1.27* 1.17* 0.97  CALCIUM 8.4*   < > 7.7* 7.4* 7.9* 8.0* 8.2*  MG 1.6*  --  2.1  --   --   --   --    < > = values in this interval not displayed.    GFR: Estimated Creatinine Clearance: 35.4 mL/min (by C-G formula based on SCr of 0.97 mg/dL). Liver Function Tests: No results for input(s): "AST", "ALT", "ALKPHOS", "BILITOT", "PROT", "ALBUMIN" in the last 168 hours. No results for input(s): "LIPASE", "AMYLASE" in the last 168 hours. No results for input(s): "AMMONIA" in the last 168 hours. Coagulation Profile: No results for input(s): "INR", "PROTIME" in the last 168 hours. Cardiac Enzymes: No results for input(s): "CKTOTAL", "CKMB", "CKMBINDEX", "TROPONINI" in the last 168 hours. BNP (last 3 results) No results for input(s): "PROBNP" in the last 8760 hours. HbA1C: No results for input(s): "HGBA1C" in the last 72 hours. CBG: Recent Labs  Lab 10/21/22 1145  GLUCAP 97    Lipid Profile: No results for input(s): "CHOL", "HDL", "LDLCALC", "TRIG", "CHOLHDL", "LDLDIRECT" in the last 72 hours. Thyroid  Function Tests: No results for input(s): "TSH", "T4TOTAL", "FREET4", "T3FREE", "THYROIDAB" in the last 72 hours. Anemia Panel: No results for input(s): "VITAMINB12", "FOLATE", "FERRITIN", "TIBC", "IRON", "RETICCTPCT" in the last 72 hours. Sepsis Labs: Recent Labs  Lab 10/20/22  1045 10/20/22 1812 10/21/22 0638  PROCALCITON  --  7.98 7.41  LATICACIDVEN 1.6  --   --      Recent Results (from the past 240 hour(s))  SARS Coronavirus 2 by RT PCR (hospital order, performed in [facility] hospital lab) *cepheid single result test* Anterior Nasal Swab     Status: None   Collection Time: 10/20/22  9:21 AM   Specimen: Anterior Nasal Swab  Result Value Ref Range Status   SARS Coronavirus 2 by RT PCR NEGATIVE NEGATIVE Final    Comment: Performed at [facility], [address] [place], [place] [address]  Blood culture (routine x 2)     Status: Abnormal (Preliminary result)   Collection Time: 10/20/22 10:45 AM   Specimen: BLOOD RIGHT ARM  Result Value Ref Range Status   Specimen Description BLOOD RIGHT ARM  Final   Special Requests   Final    BOTTLES DRAWN AEROBIC AND ANAEROBIC Blood Culture adequate volume   Culture  Setup Time   Final    GRAM NEGATIVE RODS AEROBIC BOTTLE ONLY CRITICAL RESULT CALLED TO, READ BACK BY AND VERIFIED WITH: PHARMD M. MITCHELL [id number] FCP    Culture (A)  Final    HAEMOPHILUS INFLUENZAE BETA LACTAMASE POSITIVE HEALTH DEPARTMENT NOTIFIED Referred to [facility] in [place], [place] for serotyping. Performed at [facility], [address] [place], [place] [address]    Report Status PENDING  Incomplete  Blood Culture ID Panel (Reflexed)     Status: Abnormal   Collection Time: 10/20/22 10:45 AM  Result Value Ref Range Status   Enterococcus faecalis NOT DETECTED NOT DETECTED Final   Enterococcus Faecium NOT DETECTED NOT DETECTED Final   Listeria monocytogenes NOT DETECTED NOT DETECTED Final   Staphylococcus species NOT DETECTED NOT DETECTED  Final   Staphylococcus aureus (BCID) NOT DETECTED NOT DETECTED Final   Staphylococcus epidermidis NOT DETECTED NOT DETECTED Final   Staphylococcus lugdunensis NOT DETECTED NOT DETECTED Final   Streptococcus species NOT DETECTED NOT DETECTED Final   Streptococcus agalactiae NOT DETECTED NOT DETECTED Final   Streptococcus pneumoniae NOT DETECTED NOT DETECTED Final   Streptococcus pyogenes NOT DETECTED NOT DETECTED Final   A.calcoaceticus-baumannii NOT DETECTED NOT DETECTED Final   Bacteroides fragilis NOT DETECTED NOT DETECTED Final   Enterobacterales NOT DETECTED NOT DETECTED Final   Enterobacter cloacae complex NOT DETECTED NOT DETECTED Final   Escherichia coli NOT DETECTED NOT DETECTED Final   Klebsiella aerogenes NOT DETECTED NOT DETECTED Final   Klebsiella oxytoca NOT DETECTED NOT DETECTED Final   Klebsiella pneumoniae NOT DETECTED NOT DETECTED Final   Proteus species NOT DETECTED NOT DETECTED Final   Salmonella species NOT DETECTED NOT DETECTED Final   Serratia marcescens NOT DETECTED NOT DETECTED Final   Haemophilus influenzae DETECTED (A) NOT DETECTED Final    Comment: CRITICAL RESULT CALLED TO, READ BACK BY AND VERIFIED WITH: Darci Current MITCHEL [id number] BY FCP    Neisseria meningitidis NOT DETECTED NOT DETECTED Final   Pseudomonas aeruginosa NOT DETECTED NOT DETECTED Final   Stenotrophomonas maltophilia NOT DETECTED NOT DETECTED Final   Candida albicans NOT DETECTED NOT DETECTED Final   Candida auris NOT DETECTED NOT DETECTED Final   Candida glabrata NOT DETECTED NOT DETECTED Final   Candida krusei NOT DETECTED NOT DETECTED Final   Candida parapsilosis NOT DETECTED NOT DETECTED Final   Candida tropicalis NOT DETECTED NOT DETECTED Final   Cryptococcus neoformans/gattii NOT DETECTED NOT DETECTED Final  Comment: Performed at [facility], [address] [place], [place] [address]  Blood culture (routine x 2)     Status: None   Collection Time: 10/20/22  6:09 PM    Specimen: BLOOD  Result Value Ref Range Status   Specimen Description BLOOD SITE NOT SPECIFIED  Final   Special Requests   Final    BOTTLES DRAWN AEROBIC ONLY Blood Culture adequate volume   Culture   Final    NO GROWTH 5 DAYS Performed at [facility], [address] [place], [place] [address]    Report Status 10/25/2022 FINAL  Final     Radiology Studies: No results found.  Scheduled Meds:  amoxicillin-clavulanate  1 tablet Oral Q12H   aspirin EC  81 mg Oral Daily   bisacodyl  10 mg Rectal Once   enalapril  10 mg Oral Daily   enoxaparin (LOVENOX) injection  30 mg Subcutaneous Q24H   hydrochlorothiazide  25 mg Oral Daily   levothyroxine  88 mcg Oral Q0600   metoprolol tartrate  25 mg Oral QHS   simvastatin  20 mg Oral QPM   sodium chloride flush  3 mL Intravenous Q12H   Continuous Infusions:     LOS: 4 days   Hughie Closs, MD [facility]  10/25/2022, 2:02 PM   *Please note that this is a verbal dictation therefore any spelling or grammatical errors are due to the "[employer]agon [employer] One" system interpretation.  Please page via Amion and do not message via secure chat for urgent patient care matters. Secure chat can be used for non urgent patient care matters.  How to contact the [facility] Attending or Consulting provider 7A - 7P or covering provider during after hours 7P -7A, for this patient?  Check the care team in [facility] and look for a) attending/consulting [facility] provider listed and b) the [facility] team listed. Page or secure chat 7A-7P. Log into [email or web address] and use [place]'s universal password to access. If you do not have the password, please contact the hospital operator. Locate the [facility] provider you are looking for under [facility] and page to a number that you can be directly reached. If you still have difficulty reaching the provider, please page the [facility] (Director on Call) for the Hospitalists listed on amion for assistance.

## 2022-10-25 NOTE — Progress Notes (Signed)
Patient did not want to move or turn in bed, stated she worked with PT a lot and was too tired to do anything else. Pt wanted pain meds and to go to sleep.

## 2022-10-26 DIAGNOSIS — F411 Generalized anxiety disorder: Secondary | ICD-10-CM | POA: Diagnosis not present

## 2022-10-26 DIAGNOSIS — R062 Wheezing: Secondary | ICD-10-CM | POA: Diagnosis not present

## 2022-10-26 DIAGNOSIS — N1831 Chronic kidney disease, stage 3a: Secondary | ICD-10-CM | POA: Diagnosis not present

## 2022-10-26 DIAGNOSIS — N3281 Overactive bladder: Secondary | ICD-10-CM | POA: Diagnosis not present

## 2022-10-26 DIAGNOSIS — I2510 Atherosclerotic heart disease of native coronary artery without angina pectoris: Secondary | ICD-10-CM | POA: Diagnosis not present

## 2022-10-26 DIAGNOSIS — F29 Unspecified psychosis not due to a substance or known physiological condition: Secondary | ICD-10-CM | POA: Diagnosis not present

## 2022-10-26 DIAGNOSIS — G459 Transient cerebral ischemic attack, unspecified: Secondary | ICD-10-CM

## 2022-10-26 DIAGNOSIS — E039 Hypothyroidism, unspecified: Secondary | ICD-10-CM | POA: Diagnosis not present

## 2022-10-26 DIAGNOSIS — R531 Weakness: Secondary | ICD-10-CM | POA: Diagnosis not present

## 2022-10-26 DIAGNOSIS — Z741 Need for assistance with personal care: Secondary | ICD-10-CM | POA: Diagnosis not present

## 2022-10-26 DIAGNOSIS — A419 Sepsis, unspecified organism: Secondary | ICD-10-CM

## 2022-10-26 DIAGNOSIS — R278 Other lack of coordination: Secondary | ICD-10-CM | POA: Diagnosis not present

## 2022-10-26 DIAGNOSIS — E785 Hyperlipidemia, unspecified: Secondary | ICD-10-CM | POA: Diagnosis not present

## 2022-10-26 DIAGNOSIS — Z66 Do not resuscitate: Secondary | ICD-10-CM | POA: Diagnosis not present

## 2022-10-26 DIAGNOSIS — I129 Hypertensive chronic kidney disease with stage 1 through stage 4 chronic kidney disease, or unspecified chronic kidney disease: Secondary | ICD-10-CM | POA: Diagnosis not present

## 2022-10-26 DIAGNOSIS — R262 Difficulty in walking, not elsewhere classified: Secondary | ICD-10-CM | POA: Diagnosis not present

## 2022-10-26 DIAGNOSIS — M25473 Effusion, unspecified ankle: Secondary | ICD-10-CM | POA: Diagnosis not present

## 2022-10-26 DIAGNOSIS — M25562 Pain in left knee: Secondary | ICD-10-CM | POA: Diagnosis not present

## 2022-10-26 DIAGNOSIS — J189 Pneumonia, unspecified organism: Secondary | ICD-10-CM | POA: Diagnosis not present

## 2022-10-26 DIAGNOSIS — G8929 Other chronic pain: Secondary | ICD-10-CM | POA: Diagnosis not present

## 2022-10-26 DIAGNOSIS — R2689 Other abnormalities of gait and mobility: Secondary | ICD-10-CM | POA: Diagnosis not present

## 2022-10-26 DIAGNOSIS — M6281 Muscle weakness (generalized): Secondary | ICD-10-CM | POA: Diagnosis not present

## 2022-10-26 DIAGNOSIS — Z7401 Bed confinement status: Secondary | ICD-10-CM | POA: Diagnosis not present

## 2022-10-26 DIAGNOSIS — E669 Obesity, unspecified: Secondary | ICD-10-CM | POA: Diagnosis not present

## 2022-10-26 DIAGNOSIS — E871 Hypo-osmolality and hyponatremia: Secondary | ICD-10-CM | POA: Diagnosis not present

## 2022-10-26 DIAGNOSIS — R2681 Unsteadiness on feet: Secondary | ICD-10-CM | POA: Diagnosis not present

## 2022-10-26 DIAGNOSIS — I10 Essential (primary) hypertension: Secondary | ICD-10-CM | POA: Diagnosis not present

## 2022-10-26 DIAGNOSIS — M25511 Pain in right shoulder: Secondary | ICD-10-CM | POA: Diagnosis not present

## 2022-10-26 DIAGNOSIS — L03116 Cellulitis of left lower limb: Secondary | ICD-10-CM | POA: Diagnosis not present

## 2022-10-26 DIAGNOSIS — Z86718 Personal history of other venous thrombosis and embolism: Secondary | ICD-10-CM | POA: Diagnosis not present

## 2022-10-26 DIAGNOSIS — M25512 Pain in left shoulder: Secondary | ICD-10-CM | POA: Diagnosis not present

## 2022-10-26 DIAGNOSIS — M170 Bilateral primary osteoarthritis of knee: Secondary | ICD-10-CM | POA: Diagnosis not present

## 2022-10-26 MED ORDER — FUROSEMIDE 20 MG PO TABS: 20.0000 mg | ORAL_TABLET | Freq: Every day | ORAL | 11 refills | Status: DC

## 2022-10-26 MED ORDER — ENOXAPARIN SODIUM 40 MG/0.4ML IJ SOSY
40.0000 mg | PREFILLED_SYRINGE | INTRAMUSCULAR | Status: DC
  Administered 2022-10-26: 40 mg via SUBCUTANEOUS
  Filled 2022-10-26: qty 0.4

## 2022-10-26 MED ORDER — AMOXICILLIN-POT CLAVULANATE 875-125 MG PO TABS: 1.0000 | ORAL_TABLET | Freq: Two times a day (BID) | ORAL | 0 refills | Status: DC

## 2022-10-26 NOTE — Discharge Summary (Signed)
Physician Discharge Summary  Kathleen Paul ZOX:[id number] DOB: [birth year] DOA: 10/20/2022  PCP: Kathleen Nest, NP  Admit date: 10/20/2022 Discharge date: 10/26/2022 Admitted From: Home Disposition: SNF Recommendations for Outpatient Follow-up:   Check BP, CMP and CBC in 1 week Please follow up on the following pending results: None   Discharge Condition: Stable CODE STATUS: DNR/DNI  Contact information for after-discharge care     Destination     [facility] .   Service: Skilled Nursing Contact information: [address] [place] [address] [phone]                     Hospital course 87 y.o. female with medical history significant of TIA, CAD, CKD-3A, hypertension, hyperlipidemia, GERD, obesity, hypothyroidism, anxiety, DVT presented with generalized weakness, cough and abdominal pain for about 2 weeks.   Patient had a virtual visit with PCP and was prescribed a course of prednisone about a week ago, which she has since completed and then prescribed azithromycin after she failed to improve on the prednisone.  She has chronic bilateral shoulder and left knee pain.  However it is worse after her ambulance ride.  She also developed left lower extremity redness on the morning of admission.  No fever.  Upon arrival to ED, she was slightly tachycardic.  Significant leukocytosis.  Slightly elevated troponin.  Elevated D-dimer followed by CTA negative for PE but positive for pneumonia.  CT abdomen pelvis showed leukocytosis but no other acute pathology.  She received Rocephin and doxycycline and was admitted to hospital service sepsis due to pneumonia and left lower extremity cellulitis.    Patient received ceftriaxone and doxycycline for 3 days with significant improvement.  She has been transitioned to p.o. Augmentin for total of 4 days that will end on 4/19.    On the day of discharge, leukocytosis, respiratory symptoms and LLE  erythema resolved.  Hyponatremia improved.   She had some left pedal edema.  She is discharged on p.o. Lasix 20 mg daily.  In regards to right inguinal pain, CT abdomen and pelvis without acute finding to explain her symptoms.  She has regular bowel movements.  Has no urinary issues.  Pain has improved.  Patient is discharged to SNF per recommendation by therapy.  See individual problem list below for more.   Problems addressed during this hospitalization Principal Problem:   CAP (community acquired pneumonia) Active Problems:   TIA (transient ischemic attack)   Hypertension   Dyslipidemia   CAD (coronary artery disease)   Hypothyroidism   Gastroesophageal reflux disease   Chronic kidney disease, stage 3a   GAD (generalized anxiety disorder)   Cellulitis   Hyponatremia   Sepsis Hypokalemia Hypomagnesemia            Time spent 45 minutes  Vital signs Vitals:   10/25/22 2024 10/25/22 2313 10/26/22 0333 10/26/22 0759  BP: 108/68 (!) 130/59 (!) 120/51 128/65  Pulse: 99 82 76 88  Temp: 97.7 F (36.5 C) 97.6 F (36.4 C) 97.8 F (36.6 C) 97.8 F (36.6 C)  Resp: 18 20 20 18   Height:      Weight:      SpO2: 100% 98% 100% 100%  TempSrc: Oral Oral Oral Oral  BMI (Calculated):         Discharge exam  GENERAL: No apparent distress.  Nontoxic. HEENT: MMM.  Vision and hearing grossly intact.  NECK: Supple.  No apparent JVD.  RESP:  No  IWOB.  Fair aeration bilaterally. CVS:  RRR. Heart sounds normal.  ABD/GI/GU: BS+. Abd soft, NTND.  MSK/EXT:  Moves extremities.  Some bilateral foot/ankle deformity.  Left pedal edema. SKIN: no apparent skin lesion or wound NEURO: Awake and alert. Oriented appropriately.  No apparent focal neuro deficit. PSYCH: Calm. Normal affect.   Discharge Instructions  Allergies as of 10/26/2022       Reactions   Codeine Nausea Only        Medication List     STOP taking these medications    azithromycin 250 MG tablet Commonly known  as: ZITHROMAX   clonazePAM 0.5 MG tablet Commonly known as: KlonoPIN   predniSONE 20 MG tablet Commonly known as: DELTASONE       TAKE these medications    acetaminophen 500 MG tablet Commonly known as: TYLENOL Take 2 tablets (1,000 mg total) by mouth every 8 (eight) hours.   amoxicillin-clavulanate 875-125 MG tablet Commonly known as: AUGMENTIN Take 1 tablet by mouth every 12 (twelve) hours.   aspirin EC 81 MG tablet Take 1 tablet (81 mg total) by mouth daily. Swallow whole. Aspirin & plavix for 21 days, f/by aspirin alone   CALCIUM 1200+D3 PO Take 1 tablet by mouth daily.   enalapril 10 MG tablet Commonly known as: Vasotec Take 1 tablet (10 mg total) by mouth daily. for blood pressure.   levothyroxine 88 MCG tablet Commonly known as: SYNTHROID Take 1 tablet by mouth every morning on an empty stomach with water only.  No food or other medications for 30 minutes.   metoprolol tartrate 25 MG tablet Commonly known as: LOPRESSOR TAKE 1 TABLET BY MOUTH EVERY EVENING   MULTIVITAMIN PO Take 1 tablet by mouth daily.   oxybutynin 10 MG 24 hr tablet Commonly known as: Ditropan XL Take 1 tablet (10 mg total) by mouth at bedtime. For overactive bladder   simvastatin 20 MG tablet Commonly known as: ZOCOR Take 1 tablet (20 mg total) by mouth every evening. for cholesterol.   traMADol 50 MG tablet Commonly known as: ULTRAM Take 1 tablet (50 mg total) by mouth daily as needed. For moderate pain.        Consultations: None  Procedures/Studies:   VAS [place] LOWER EXTREMITY VENOUS (DVT) (ONLY [facility] & [facility])  Result Date: 10/21/2022  Lower Venous DVT Study Patient Name:  Kathleen Paul  Date of Exam:   10/20/2022 Medical Rec #: [id number]        Accession #:    [id number] Date of Birth: 01/31/1936         Patient Gender: F Patient Age:   87 years Exam Location:  [facility] Procedure:      VAS [place] LOWER EXTREMITY VENOUS (DVT) Referring Phys: Kathleen Paul  --------------------------------------------------------------------------------  Indications: Pain in bilateral lower extremities- right groin, left knee, new erythema of left lower extremity x1 day. Other Indications: Remote history of DVT. Limitations: Poor ultrasound/tissue interface and patient pain/sensitivity to compressions. Comparison Study: No prior studies available. Performing Technologist: Jean Rosenthal RDMS, RVT  Examination Guidelines: A complete evaluation includes B-mode imaging, spectral Doppler, color Doppler, and power Doppler as needed of all accessible portions of each vessel. Bilateral testing is considered an integral part of a complete examination. Limited examinations for reoccurring indications may be performed as noted. The reflux portion of the exam is performed with the patient in reverse Trendelenburg.  +---------+---------------+---------+-----------+----------+-------------------+ RIGHT    CompressibilityPhasicitySpontaneityPropertiesThrombus Aging      +---------+---------------+---------+-----------+----------+-------------------+ CFV  Full           Yes      Yes                                      +---------+---------------+---------+-----------+----------+-------------------+ SFJ      Full                                                             +---------+---------------+---------+-----------+----------+-------------------+ FV Prox  Full                                                             +---------+---------------+---------+-----------+----------+-------------------+ FV Mid   Full           Yes      Yes                                      +---------+---------------+---------+-----------+----------+-------------------+ FV DistalFull           Yes      Yes                                      +---------+---------------+---------+-----------+----------+-------------------+ PFV      Full                                                              +---------+---------------+---------+-----------+----------+-------------------+ POP      Full           Yes      Yes                                      +---------+---------------+---------+-----------+----------+-------------------+ PTV      Full                                                             +---------+---------------+---------+-----------+----------+-------------------+ PERO                                                  Not well visualized +---------+---------------+---------+-----------+----------+-------------------+   +---------+---------------+---------+-----------+---------------+--------------+ LEFT     CompressibilityPhasicitySpontaneityProperties     Thrombus Aging +---------+---------------+---------+-----------+---------------+--------------+ CFV      Full           Yes      Yes                                      +---------+---------------+---------+-----------+---------------+--------------+  SFJ      Full                                                             +---------+---------------+---------+-----------+---------------+--------------+ FV Prox  Full                                                             +---------+---------------+---------+-----------+---------------+--------------+ FV Mid                  Yes      Yes        Patent by color               +---------+---------------+---------+-----------+---------------+--------------+ FV Distal               Yes      Yes        Patent by color               +---------+---------------+---------+-----------+---------------+--------------+ PFV      Full                                                             +---------+---------------+---------+-----------+---------------+--------------+ POP      Full           Yes      Yes                                       +---------+---------------+---------+-----------+---------------+--------------+ PTV      Full                                                             +---------+---------------+---------+-----------+---------------+--------------+ PERO     Full                                              Some segments                                                             not well  visualized     +---------+---------------+---------+-----------+---------------+--------------+     Summary: RIGHT: - There is no evidence of deep vein thrombosis in the lower extremity. However, portions of this examination were limited- see technologist comments above.  - No cystic structure found in the popliteal fossa.  LEFT: - There is no evidence of deep vein thrombosis in the lower extremity. However, portions of this examination were limited- see technologist comments above.  - No cystic structure found in the popliteal fossa.  *See table(s) above for measurements and observations. Electronically signed by Gerarda Fraction on 10/21/2022 at 9:13:51 AM.    Final    CT ABDOMEN PELVIS W CONTRAST  Result Date: 10/20/2022 CLINICAL DATA:  Left lung opacity, inguinal hernia with worsening pain. EXAM: CT ANGIOGRAPHY CHEST CT ABDOMEN AND PELVIS WITH CONTRAST TECHNIQUE: Multidetector CT imaging of the chest was performed using the standard protocol during bolus administration of intravenous contrast. Multiplanar CT image reconstructions and MIPs were obtained to evaluate the vascular anatomy. Multidetector CT imaging of the abdomen and pelvis was performed using the standard protocol during bolus administration of intravenous contrast. RADIATION DOSE REDUCTION: This exam was performed according to the departmental dose-optimization program which includes automated exposure control, adjustment of the mA and/or kV according to patient size and/or use of  iterative reconstruction technique. CONTRAST:  60mL OMNIPAQUE IOHEXOL 350 MG/ML SOLN COMPARISON:  Chest radiograph performed earlier on the same date. FINDINGS: CTA CHEST FINDINGS Cardiovascular: Satisfactory opacification of the pulmonary arteries to the segmental level. No evidence of pulmonary embolism. Heart is normal in size. Coronary artery atherosclerotic calcifications. Mediastinum/Nodes: No enlarged mediastinal, hilar, or axillary lymph nodes. Thyroid and trachea are within normal limits. Large hiatal hernia with partial intrathoracic stomach. Lungs/Pleura: Lower lobe opacity with air bronchograms concerning for pneumonia. Right basilar subsegmental linear atelectasis and small right pleural effusion. Musculoskeletal: Thoracic kyphoscoliosis with multilevel degenerate disc disease. No acute chest wall abnormality. Moderate bilateral glenohumeral osteoarthritis. Review of the MIP images confirms the above findings. CT ABDOMEN and PELVIS FINDINGS Hepatobiliary: No focal liver abnormality is seen. No gallstones, gallbladder wall thickening, or biliary dilatation. Pancreas: Unremarkable. No pancreatic ductal dilatation or surrounding inflammatory changes. Spleen: Normal in size without focal abnormality. Adrenals/Urinary Tract: Adrenal glands are unremarkable. Kidneys are normal, without renal calculi, focal lesion, or hydronephrosis. Bladder is unremarkable. Multiple bilateral small renal cortical cysts, likely benign process. Stomach/Bowel: Moderate size hiatal hernia with partially intrathoracic stomach without evidence of obstruction. Small bowel loops are normal in caliber. Appendix not identified, however no inflammatory changes in the pericecal region. Sigmoid colonic diverticulosis without evidence of acute diverticulitis. Vascular/Lymphatic: Moderate aortic atherosclerotic calcification of aorta and branch vessels. No enlarged abdominal or pelvic lymph nodes. Reproductive: Status post hysterectomy. No  adnexal masses. Other: Fat containing left inguinal hernia without evidence of obstruction. There is also fat containing umbilical hernia. Nonspecific fat stranding in the left inguinal region subcutaneous soft tissues. Musculoskeletal: Moderate-to-severe levoscoliosis of the lumbar spine centered at L4 vertebral body with partial left lateral subluxation of the L3. 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 Advanced right hip osteoarthritis with near complete loss of the articular cartilage and subchondral cystic changes. Review of the MIP images confirms the above findings. IMPRESSION: CT angio chest: 1. No evidence of pulmonary embolism. 2. Left lower lobe opacity with air bronchograms concerning for pneumonia. Follow-up examination to resolution is recommended. 3. Large hiatal hernia with partial intrathoracic stomach. CT abdomen/pelvis: 1. Sigmoid colonic diverticulosis without evidence  of acute diverticulitis. 2. Fat containing left inguinal hernia without evidence of obstruction. 3. Nonspecific left inguinal region fat stranding without evidence of fluid collection or abscess. Correlate with physical examination findings. 4. Moderate-to-severe levoscoliosis of the lumbar spine centered at L3 vertebral body with partial left lateral subluxation of the L4 vertebral body. 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 5. Advanced right hip osteoarthritis with near complete loss of the articular cartilage and subchondral cystic changes. 6. Aortic Atherosclerosis (ICD10-I70.0). Electronically Signed   By: Larose Hires D.O.   On: 10/20/2022 12:52   CT Angio Chest PE W and/or Wo Contrast  Result Date: 10/20/2022 CLINICAL DATA:  Left lung opacity, inguinal hernia with worsening pain. EXAM: CT ANGIOGRAPHY CHEST CT ABDOMEN AND PELVIS WITH CONTRAST TECHNIQUE: Multidetector CT  imaging of the chest was performed using the standard protocol during bolus administration of intravenous contrast. Multiplanar CT image reconstructions and MIPs were obtained to evaluate the vascular anatomy. Multidetector CT imaging of the abdomen and pelvis was performed using the standard protocol during bolus administration of intravenous contrast. RADIATION DOSE REDUCTION: This exam was performed according to the departmental dose-optimization program which includes automated exposure control, adjustment of the mA and/or kV according to patient size and/or use of iterative reconstruction technique. CONTRAST:  60mL OMNIPAQUE IOHEXOL 350 MG/ML SOLN COMPARISON:  Chest radiograph performed earlier on the same date. FINDINGS: CTA CHEST FINDINGS Cardiovascular: Satisfactory opacification of the pulmonary arteries to the segmental level. No evidence of pulmonary embolism. Heart is normal in size. Coronary artery atherosclerotic calcifications. Mediastinum/Nodes: No enlarged mediastinal, hilar, or axillary lymph nodes. Thyroid and trachea are within normal limits. Large hiatal hernia with partial intrathoracic stomach. Lungs/Pleura: Lower lobe opacity with air bronchograms concerning for pneumonia. Right basilar subsegmental linear atelectasis and small right pleural effusion. Musculoskeletal: Thoracic kyphoscoliosis with multilevel degenerate disc disease. No acute chest wall abnormality. Moderate bilateral glenohumeral osteoarthritis. Review of the MIP images confirms the above findings. CT ABDOMEN and PELVIS FINDINGS Hepatobiliary: No focal liver abnormality is seen. No gallstones, gallbladder wall thickening, or biliary dilatation. Pancreas: Unremarkable. No pancreatic ductal dilatation or surrounding inflammatory changes. Spleen: Normal in size without focal abnormality. Adrenals/Urinary Tract: Adrenal glands are unremarkable. Kidneys are normal, without renal calculi, focal lesion, or hydronephrosis. Bladder is  unremarkable. Multiple bilateral small renal cortical cysts, likely benign process. Stomach/Bowel: Moderate size hiatal hernia with partially intrathoracic stomach without evidence of obstruction. Small bowel loops are normal in caliber. Appendix not identified, however no inflammatory changes in the pericecal region. Sigmoid colonic diverticulosis without evidence of acute diverticulitis. Vascular/Lymphatic: Moderate aortic atherosclerotic calcification of aorta and branch vessels. No enlarged abdominal or pelvic lymph nodes. Reproductive: Status post hysterectomy. No adnexal masses. Other: Fat containing left inguinal hernia without evidence of obstruction. There is also fat containing umbilical hernia. Nonspecific fat stranding in the left inguinal region subcutaneous soft tissues. Musculoskeletal: Moderate-to-severe levoscoliosis of the lumbar spine centered at L4 vertebral body with partial left lateral subluxation of the L3. 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 Advanced right hip osteoarthritis with near complete loss of the articular cartilage and subchondral cystic changes. Review of the MIP images confirms the above findings. IMPRESSION: CT angio chest: 1. No evidence of pulmonary embolism. 2. Left lower lobe opacity with air bronchograms concerning for pneumonia. Follow-up examination to resolution is recommended. 3. Large hiatal hernia with partial intrathoracic stomach. CT abdomen/pelvis: 1. Sigmoid colonic diverticulosis  without evidence of acute diverticulitis. 2. Fat containing left inguinal hernia without evidence of obstruction. 3. Nonspecific left inguinal region fat stranding without evidence of fluid collection or abscess. Correlate with physical examination findings. 4. Moderate-to-severe levoscoliosis of the lumbar spine centered at L3 vertebral body with partial left lateral subluxation of the L4 vertebral body.  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 5. Advanced right hip osteoarthritis with near complete loss of the articular cartilage and subchondral cystic changes. 6. Aortic Atherosclerosis (ICD10-I70.0). Electronically Signed   By: Larose Hires D.O.   On: 10/20/2022 12:52   DG Chest 2 View  Result Date: 10/20/2022 CLINICAL DATA:  Provided history: Weakness. Cough. Pneumonia evaluation. EXAM: CHEST - 2 VIEW COMPARISON:  Chest radiographs 07/05/2021 and earlier. CT abdomen/pelvis 08/20/2012. FINDINGS: Heart size within normal limits. Aortic atherosclerosis. Large hiatal hernia. Ill-defined opacity within the mid to lower left lung. Small left pleural effusion. No appreciable airspace consolidation on the right. No evidence of pneumothorax. No acute osseous abnormality identified. Degenerative changes of the spine and bilateral glenohumeral joints. Chronic appearing deformity of the metadiaphysis of the proximal right humerus. IMPRESSION: 1. Ill-defined opacity within the mid to lower left lung, which may reflect atelectasis and/or pneumonia. 2. Small left pleural effusion. 3. Large hiatal hernia. 4.  Aortic Atherosclerosis (ICD10-I70.0). Electronically Signed   By: Jackey Loge D.O.   On: 10/20/2022 10:14       The results of significant diagnostics from this hospitalization (including imaging, microbiology, ancillary and laboratory) are listed below for reference.     Microbiology: Recent Results (from the past 240 hour(s))  SARS Coronavirus 2 by RT PCR (hospital order, performed in [facility] hospital lab) *cepheid single result test* Anterior Nasal Swab     Status: None   Collection Time: 10/20/22  9:21 AM   Specimen: Anterior Nasal Swab  Result Value Ref Range Status   SARS Coronavirus 2 by RT PCR NEGATIVE NEGATIVE Final    Comment: Performed at [facility], [address], [place], [place] [address]  Blood culture  (routine x 2)     Status: Abnormal (Preliminary result)   Collection Time: 10/20/22 10:45 AM   Specimen: BLOOD RIGHT ARM  Result Value Ref Range Status   Specimen Description BLOOD RIGHT ARM  Final   Special Requests   Final    BOTTLES DRAWN AEROBIC AND ANAEROBIC Blood Culture adequate volume   Culture  Setup Time   Final    GRAM NEGATIVE RODS AEROBIC BOTTLE ONLY CRITICAL RESULT CALLED TO, READ BACK BY AND VERIFIED WITH: PHARMD M. MITCHELL [phone] FCP    Culture (A)  Final    HAEMOPHILUS INFLUENZAE BETA LACTAMASE POSITIVE HEALTH DEPARTMENT NOTIFIED Referred to [facility] in [place], [place] for serotyping. Performed at [facility], [address], [place], [place] [address]    Report Status PENDING  Incomplete  Blood Culture ID Panel (Reflexed)     Status: Abnormal   Collection Time: 10/20/22 10:45 AM  Result Value Ref Range Status   Enterococcus faecalis NOT DETECTED NOT DETECTED Final   Enterococcus Faecium NOT DETECTED NOT DETECTED Final   Listeria monocytogenes NOT DETECTED NOT DETECTED Final   Staphylococcus species NOT DETECTED NOT DETECTED Final   Staphylococcus aureus (BCID) NOT DETECTED NOT DETECTED Final   Staphylococcus epidermidis NOT DETECTED NOT DETECTED Final   Staphylococcus lugdunensis NOT DETECTED NOT DETECTED Final   Streptococcus species NOT DETECTED NOT  DETECTED Final   Streptococcus agalactiae NOT DETECTED NOT DETECTED Final   Streptococcus pneumoniae NOT DETECTED NOT DETECTED Final   Streptococcus pyogenes NOT DETECTED NOT DETECTED Final   A.calcoaceticus-baumannii NOT DETECTED NOT DETECTED Final   Bacteroides fragilis NOT DETECTED NOT DETECTED Final   Enterobacterales NOT DETECTED NOT DETECTED Final   Enterobacter cloacae complex NOT DETECTED NOT DETECTED Final   Escherichia coli NOT DETECTED NOT DETECTED Final   Klebsiella aerogenes NOT DETECTED NOT DETECTED Final   Klebsiella oxytoca NOT DETECTED NOT DETECTED Final   Klebsiella  pneumoniae NOT DETECTED NOT DETECTED Final   Proteus species NOT DETECTED NOT DETECTED Final   Salmonella species NOT DETECTED NOT DETECTED Final   Serratia marcescens NOT DETECTED NOT DETECTED Final   Haemophilus influenzae DETECTED (A) NOT DETECTED Final    Comment: CRITICAL RESULT CALLED TO, READ BACK BY AND VERIFIED WITH: Darci Current MITCHEL [id number] BY FCP    Neisseria meningitidis NOT DETECTED NOT DETECTED Final   Pseudomonas aeruginosa NOT DETECTED NOT DETECTED Final   Stenotrophomonas maltophilia NOT DETECTED NOT DETECTED Final   Candida albicans NOT DETECTED NOT DETECTED Final   Candida auris NOT DETECTED NOT DETECTED Final   Candida glabrata NOT DETECTED NOT DETECTED Final   Candida krusei NOT DETECTED NOT DETECTED Final   Candida parapsilosis NOT DETECTED NOT DETECTED Final   Candida tropicalis NOT DETECTED NOT DETECTED Final   Cryptococcus neoformans/gattii NOT DETECTED NOT DETECTED Final    Comment: Performed at [facility], [address], [place], [place] [id number]  Blood culture (routine x 2)     Status: None   Collection Time: 10/20/22  6:09 PM   Specimen: BLOOD  Result Value Ref Range Status   Specimen Description BLOOD SITE NOT SPECIFIED  Final   Special Requests   Final    BOTTLES DRAWN AEROBIC ONLY Blood Culture adequate volume   Culture   Final    NO GROWTH 5 DAYS Performed at [facility], [address] [place], [place] [address]    Report Status 10/25/2022 FINAL  Final     Labs:  CBC: Recent Labs  Lab 10/20/22 0927 10/21/22 0638 10/22/22 0146 10/23/22 0150 10/24/22 0603 10/25/22 0850  WBC 21.4* 15.3* 13.5* 13.3* 15.4* 10.1  NEUTROABS 20.1*  --  11.7* 11.4* 10.1* 6.0  HGB 10.4* 8.6* 8.1* 8.6* 8.3* 9.3*  HCT 30.2* 24.5* 23.1* 23.9* 22.8* 27.4*  MCV 88.6 86.9 85.9 84.8 83.8 87.8  PLT 201 154 135* 134* 154 175   BMP &GFR Recent Labs  Lab 10/20/22 0927 10/20/22 1812 10/21/22 0638 10/22/22 0146 10/23/22 0150 10/24/22 0603  10/25/22 0850  NA 125*   < > 126* 125* 126* 128* 130*  K 4.2   < > 3.9 3.6 3.5 3.2* 4.2  CL 91*   < > 94* 95* 93* 93* 97*  CO2 22   < > 18* 18* 20* 21* 21*  GLUCOSE 64*   < > 81 107* 107* 98 76  BUN 61*   < > 53* 51* 51* 43* 32*  CREATININE 1.31*   < > 1.29* 1.31* 1.27* 1.17* 0.97  CALCIUM 8.4*   < > 7.7* 7.4* 7.9* 8.0* 8.2*  MG 1.6*  --  2.1  --   --   --   --    < > = values in this interval not displayed.   Estimated Creatinine Clearance: 35.4 mL/min (by C-G formula based on SCr of 0.97 mg/dL). Liver &  Pancreas: No results for input(s): "AST", "ALT", "ALKPHOS", "BILITOT", "PROT", "ALBUMIN" in the last 168 hours. No results for input(s): "LIPASE", "AMYLASE" in the last 168 hours. No results for input(s): "AMMONIA" in the last 168 hours. Diabetic: No results for input(s): "HGBA1C" in the last 72 hours. Recent Labs  Lab 10/21/22 1145  GLUCAP 97   Cardiac Enzymes: No results for input(s): "CKTOTAL", "CKMB", "CKMBINDEX", "TROPONINI" in the last 168 hours. No results for input(s): "PROBNP" in the last 8760 hours. Coagulation Profile: No results for input(s): "INR", "PROTIME" in the last 168 hours. Thyroid Function Tests: No results for input(s): "TSH", "T4TOTAL", "FREET4", "T3FREE", "THYROIDAB" in the last 72 hours. Lipid Profile: No results for input(s): "CHOL", "HDL", "LDLCALC", "TRIG", "CHOLHDL", "LDLDIRECT" in the last 72 hours. Anemia Panel: No results for input(s): "VITAMINB12", "FOLATE", "FERRITIN", "TIBC", "IRON", "RETICCTPCT" in the last 72 hours. Urine analysis:    Component Value Date/Time   COLORURINE YELLOW 10/22/2022 1003   APPEARANCEUR HAZY (A) 10/22/2022 1003   LABSPEC 1.019 10/22/2022 1003   PHURINE 6.0 10/22/2022 1003   GLUCOSEU NEGATIVE 10/22/2022 1003   HGBUR NEGATIVE 10/22/2022 1003   BILIRUBINUR NEGATIVE 10/22/2022 1003   BILIRUBINUR negative 04/19/2020 0856   KETONESUR NEGATIVE 10/22/2022 1003   PROTEINUR 30 (A) 10/22/2022 1003   UROBILINOGEN 0.2  04/19/2020 0856   NITRITE NEGATIVE 10/22/2022 1003   LEUKOCYTESUR TRACE (A) 10/22/2022 1003   Sepsis Labs: Invalid input(s): "PROCALCITONIN", "LACTICIDVEN"   SIGNED:  Almon Hercules, MD  [facility] 10/26/2022, 11:14 AM

## 2022-10-26 NOTE — Progress Notes (Signed)
Report given to facility, awaiting ptar at this time

## 2022-10-26 NOTE — TOC Transition Note (Signed)
Transition of Care [facility]) - CM/SW Discharge Note   Patient Details  Name: Kathleen Paul MRN: [id number] Date of Birth: February 04, 1936  Transition of Care [facility] CM/SW Contact:  Eduard Roux, LCSW Phone Number: 10/26/2022, 12:45 PM   Clinical Narrative:     Patient will Discharge to: [facility] Discharge Date: 10/26/2022 Family Notified: daughter Transport By: Sharin Mons  Per MD patient is ready for discharge. RN, patient, and facility notified of discharge. Discharge Summary sent to facility. RN given number for [phone]. Ambulance transport requested for patient.   Clinical Social Worker signing off.  Antony Blackbird, MSW, LCSW Clinical Social Worker     Final next level of care: Skilled Nursing Facility Barriers to Discharge: Barriers Resolved   Patient Goals and CMS Choice CMS Medicare.gov Compare Post Acute Care list provided to::  (Patient and patients Daughter [name]) Choice offered to / list presented to : Adult Children, Patient  Discharge Placement                Patient chooses bed at:  [facility]) Patient to be transferred to facility by: [facility] Name of family member notified: daughter Patient and family notified of of transfer: 10/26/22  Discharge Plan and Services Additional resources added to the After Visit Summary for   In-house Referral: Clinical Social Work                                   Social Determinants of Health (SDOH) Interventions SDOH Screenings   Food Insecurity: No Food Insecurity (10/20/2022)  Housing: Low Risk  (10/20/2022)  Transportation Needs: No Transportation Needs (10/20/2022)  Utilities: Not At Risk (10/20/2022)  Alcohol Screen: Low Risk  (12/14/2021)  Depression (PHQ2-9): Low Risk  (12/14/2021)  Financial Resource Strain: Low Risk  (12/14/2021)  Physical Activity: Sufficiently Active (12/14/2021)  Social Connections: Moderately Isolated (12/14/2021)  Stress: No Stress Concern Present (12/14/2021)  Tobacco Use: Medium  Risk (10/20/2022)     Readmission Risk Interventions     No data to display

## 2022-10-27 ENCOUNTER — Non-Acute Institutional Stay (SKILLED_NURSING_FACILITY): Payer: PPO | Admitting: Student

## 2022-10-27 ENCOUNTER — Encounter: Payer: Self-pay | Admitting: Student

## 2022-10-27 DIAGNOSIS — M170 Bilateral primary osteoarthritis of knee: Secondary | ICD-10-CM

## 2022-10-27 DIAGNOSIS — N1831 Chronic kidney disease, stage 3a: Secondary | ICD-10-CM | POA: Diagnosis not present

## 2022-10-27 DIAGNOSIS — I10 Essential (primary) hypertension: Secondary | ICD-10-CM | POA: Diagnosis not present

## 2022-10-27 DIAGNOSIS — Z66 Do not resuscitate: Secondary | ICD-10-CM

## 2022-10-27 DIAGNOSIS — J189 Pneumonia, unspecified organism: Secondary | ICD-10-CM | POA: Diagnosis not present

## 2022-10-27 MED ORDER — TRAMADOL HCL 50 MG PO TABS
50.0000 mg | ORAL_TABLET | Freq: Two times a day (BID) | ORAL | 0 refills | Status: DC | PRN
Start: 2022-10-27 — End: 2022-11-06

## 2022-10-27 NOTE — Progress Notes (Signed)
Provider:  Dr. Earnestine Mealing Location:  Other [place].  Nursing Home Room Number: [facility] Place of Service:  SNF (31)  PCP: Doreene Nest, NP Patient Care Team: Doreene Nest, NP as PCP - General (Internal Medicine) Pricilla Riffle, MD as PCP - Cardiology (Cardiology) Kathyrn Sheriff, [facility] as Pharmacist (Pharmacist)  Extended Emergency Contact Information Primary Emergency Contact: [facility] Address: [address]          [place], [place] [address] D[place] of [place] Home Phone: [phone] Mobile Phone: [phone] Relation: Daughter Secondary Emergency Contact: MERRITT,PATRICIA Address: [address] Home Phone: [phone] Mobile Phone: [phone] Relation: Daughter  Code Status: DNR per [facility] Goals of Care: Advanced Directive information    10/27/2022    8:47 AM  Advanced Directives  Does Patient Have a Medical Advance Directive? Yes  Type of Estate agent of [place];Out of facility DNR (pink MOST or yellow form)  Does patient want to make changes to medical advance directive? No - Patient declined  Copy of Healthcare Power of Attorney in Chart? Yes - validated most recent copy scanned in chart (See row information)      Chief Complaint  Patient presents with  . New Admit To SNF    Admission.     HPI: Patient is a 87 y.o. female seen today for admission to  She got hoarse and her PCP starte dprednisone. She was started on an antibiotic on a Tuesday. Thursday she was feeling okay, then Friday morning when she stood up she couldn't stand up even with a lift chair. They called the ambulance. In the hospital she was treated for pneumonia. She was never on oxygen.   She lives at daughter [name]. She was taking care of herself. Changing clothes and preparing breakfast. She bathed on her own. She just recently stopped driving because she started hurting bad in her groin. She stopped driving probably 5 weeks ago.  She takes care of all of her medications at home as well. They moved in together probably 15 years ago, but not because of a dependence issue. She uses a walker to get around and her goal is to get back home to that point.   She has had shoulder injections which have helped. She isn't a candidate Past Medical History:  Diagnosis Date  . Anxiety state, unspecified   . Arthritis   . Atherosclerosis   . Cancer    skin cancer  . Diverticulosis   . Esophageal stricture   . Essential hypertension, benign   . Gastric ulcer   . Hiatal hernia   . History of DVT (deep vein thrombosis) yrs ago   legs  . IDA (iron deficiency anemia)   . Inguinal hernia without mention of obstruction or gangrene, unilateral or unspecified, (not specified as recurrent)    2014  . Internal hemorrhoids   . Mild mitral regurgitation    a. 04/2010 Echo: nl EF, mild diast dysfxn, trace PR, mild TR, mild-mod MR.  . Non-obstructive CAD    a. 04/2010 Myoview: EF 77%, fixed apical/inferior defect;  b. 04/2010 Cath: LAD 57m, LCX/RCA minor irregs, EF 60%;  c. 04/2011 Myoview: low risk.  . Pure hypercholesterolemia   . Rupture of left patellar tendon    05-26-2019  . Scoliosis   . UI (urinary incontinence)   . Umbilical hernia without mention of obstruction or gangrene    2014  . Unspecified hypothyroidism   .  Vitamin D deficiency    Past Surgical History:  Procedure Laterality Date  . ABDOMINAL HYSTERECTOMY  2008   complete   . BIOPSY  04/02/2018   Procedure: BIOPSY;  Surgeon: Meridee Score Netty Starring., MD;  Location: [facility];  Service: Gastroenterology;;  . CARDIAC CATHETERIZATION  2011   [facility]  . COLONOSCOPY  2003   [facility]  . ESOPHAGOGASTRODUODENOSCOPY (EGD) WITH PROPOFOL N/A 04/02/2018   Procedure: ESOPHAGOGASTRODUODENOSCOPY (EGD) WITH PROPOFOL;  Surgeon: Meridee Score Netty Starring., MD;  Location: [facility];  Service: Gastroenterology;  Laterality: N/A;  . FOREIGN BODY REMOVAL  04/02/2018    Procedure: FOREIGN BODY REMOVAL;  Surgeon: Meridee Score Netty Starring., MD;  Location: [facility];  Service: Gastroenterology;;  . HERNIA REPAIR Right 09/23/2012   repair RIH  . LAPAROSCOPIC HYSTERECTOMY    . PATELLAR TENDON REPAIR Left 06/03/2019   Procedure: LEFT PATELLA TENDON REPAIR, RIGHT KNEE INJECTION.;  Surgeon: Durene Romans, MD;  Location: [facility];  Service: Orthopedics;  Laterality: Left;  . PATELLAR TENDON REPAIR Left 07/01/2019   Procedure: OPEN PATELLA TENDON REPAIR;  Surgeon: Durene Romans, MD;  Location: [facility];  Service: Orthopedics;  Laterality: Left;  . TONSILLECTOMY    . TOTAL KNEE ARTHROPLASTY Left 02/11/2019   Procedure: TOTAL KNEE ARTHROPLASTY;  Surgeon: Durene Romans, MD;  Location: [facility];  Service: Orthopedics;  Laterality: Left;  70 mins    reports that she quit smoking about 34 years ago. Her smoking use included cigarettes. She has a 15.00 pack-year smoking history. She has never used smokeless tobacco. She reports that she does not drink alcohol and does not use drugs. Social History   Socioeconomic History  . Marital status: Widowed    Spouse name: Not on file  . Number of children: Not on file  . Years of education: Not on file  . Highest education level: Not on file  Occupational History  . Not on file  Tobacco Use  . Smoking status: Former    Packs/day: 0.50    Years: 30.00    Additional pack years: 0.00    Total pack years: 15.00    Types: Cigarettes    Quit date: 07/10/1988    Years since quitting: 34.3  . Smokeless tobacco: Never  Vaping Use  . Vaping Use: Never used  Substance and Sexual Activity  . Alcohol use: No  . Drug use: Never  . Sexual activity: Not on file  Other Topics Concern  . Not on file  Social History Narrative  . Not on file   Social Determinants of Health   Financial Resource Strain: Low Risk  (12/14/2021)   Overall Financial Resource Strain (CARDIA)   . Difficulty of Paying Living Expenses: Not hard at all  Food  Insecurity: No Food Insecurity (10/20/2022)   Hunger Vital Sign   . Worried About Programme researcher, broadcasting/film/video in the Last Year: Never true   . Ran Out of Food in the Last Year: Never true  Transportation Needs: No Transportation Needs (10/20/2022)   PRAPARE - Transportation   . Lack of Transportation (Medical): No   . Lack of Transportation (Non-Medical): No  Physical Activity: Sufficiently Active (12/14/2021)   Exercise Vital Sign   . Days of Exercise per Week: 5 days   . Minutes of Exercise per Session: 30 min  Stress: No Stress Concern Present (12/14/2021)   [employer] of Occupational Health - Occupational Stress Questionnaire   . Feeling of Stress : Not at all  Social Connections: Moderately Isolated (12/14/2021)  Social Connection and Isolation Panel [NHANES]   . Frequency of Communication with Friends and Family: More than three times a week   . Frequency of Social Gatherings with Friends and Family: More than three times a week   . Attends Religious Services: 1 to 4 times per year   . Active Member of Clubs or Organizations: No   . Attends Banker Meetings: Never   . Marital Status: Widowed  Intimate Partner Violence: Not At Risk (10/20/2022)   Humiliation, Afraid, Rape, and Kick questionnaire   . Fear of Current or Ex-Partner: No   . Emotionally Abused: No   . Physically Abused: No   . Sexually Abused: No    Functional Status Survey:    Family History  Problem Relation Age of Onset  . Heart disease Mother   . Heart attack Mother   . Heart disease Father   . Heart attack Father   . Breast cancer Sister   . Breast cancer Maternal Aunt   . Heart disease Brother        sister    Health Maintenance  Topic Date Due  . DTaP/Tdap/Td (1 - Tdap) Never done  . Zoster Vaccines- Shingrix (1 of 2) Never done  . Medicare Annual Wellness (AWV)  12/15/2022  . INFLUENZA VACCINE  02/08/2023  . Pneumonia Vaccine 63+ Years old  Completed  . HPV VACCINES  Aged Out  . DEXA  SCAN  Discontinued  . COVID-19 Vaccine  Discontinued    Allergies  Allergen Reactions  . Codeine Nausea Only    Outpatient Encounter Medications as of 10/27/2022  Medication Sig  . acetaminophen (TYLENOL) 500 MG tablet Take 2 tablets (1,000 mg total) by mouth every 8 (eight) hours.  Marland Kitchen amoxicillin-clavulanate (AUGMENTIN) 875-125 MG tablet Take 1 tablet by mouth every 12 (twelve) hours.  Marland Kitchen aspirin EC 81 MG tablet Take 1 tablet (81 mg total) by mouth daily. Swallow whole. Aspirin & plavix for 21 days, f/by aspirin alone  . Calcium-Magnesium-Vitamin D (CALCIUM 1200+D3 PO) Take 1 tablet by mouth daily.  . enalapril (VASOTEC) 10 MG tablet Take 1 tablet (10 mg total) by mouth daily. for blood pressure.  Marland Kitchen levothyroxine (SYNTHROID) 88 MCG tablet Take 1 tablet by mouth every morning on an empty stomach with water only.  No food or other medications for 30 minutes.  . metoprolol tartrate (LOPRESSOR) 25 MG tablet TAKE 1 TABLET BY MOUTH EVERY EVENING  . Multiple Vitamin (MULTIVITAMIN PO) Take 1 tablet by mouth daily.  Marland Kitchen oxybutynin (DITROPAN XL) 10 MG 24 hr tablet Take 1 tablet (10 mg total) by mouth at bedtime. For overactive bladder  . simvastatin (ZOCOR) 20 MG tablet Take 1 tablet (20 mg total) by mouth every evening. for cholesterol.  . traMADol (ULTRAM) 50 MG tablet Take 1 tablet (50 mg total) by mouth daily as needed. For moderate pain.  . [DISCONTINUED] furosemide (LASIX) 20 MG tablet Take 1 tablet (20 mg total) by mouth daily.   No facility-administered encounter medications on file as of 10/27/2022.    Review of Systems  Vitals:   10/27/22 0841 10/27/22 0900  BP: (!) 151/69 128/66  Pulse: 92   Resp: 20   Temp: 98.1 F (36.7 C)   SpO2: 96%   Weight: 159 lb (72.1 kg)   Height: 4\' 11"  (1.499 m)    Body mass index is 32.11 kg/m. Physical Exam  Labs reviewed: Basic Metabolic Panel: Recent Labs    10/20/22 0927 10/20/22 1812 10/21/22  7846 10/22/22 0146 10/23/22 0150  10/24/22 0603 10/25/22 0850  NA 125*   < > 126*   < > 126* 128* 130*  K 4.2   < > 3.9   < > 3.5 3.2* 4.2  CL 91*   < > 94*   < > 93* 93* 97*  CO2 22   < > 18*   < > 20* 21* 21*  GLUCOSE 64*   < > 81   < > 107* 98 76  BUN 61*   < > 53*   < > 51* 43* 32*  CREATININE 1.31*   < > 1.29*   < > 1.27* 1.17* 0.97  CALCIUM 8.4*   < > 7.7*   < > 7.9* 8.0* 8.2*  MG 1.6*  --  2.1  --   --   --   --    < > = values in this interval not displayed.   Liver Function Tests: Recent Labs    12/13/21 0926 06/11/22 1117  AST 17 21  ALT 8 12  ALKPHOS 70 65  BILITOT 0.9 0.8  PROT 6.6 7.5  ALBUMIN 4.3 4.3   No results for input(s): "LIPASE", "AMYLASE" in the last 8760 hours. No results for input(s): "AMMONIA" in the last 8760 hours. CBC: Recent Labs    10/23/22 0150 10/24/22 0603 10/25/22 0850  WBC 13.3* 15.4* 10.1  NEUTROABS 11.4* 10.1* 6.0  HGB 8.6* 8.3* 9.3*  HCT 23.9* 22.8* 27.4*  MCV 84.8 83.8 87.8  PLT 134* 154 175   Cardiac Enzymes: No results for input(s): "CKTOTAL", "CKMB", "CKMBINDEX", "TROPONINI" in the last 8760 hours. BNP: Invalid input(s): "POCBNP" Lab Results  Component Value Date   HGBA1C 5.5 06/11/2022   Lab Results  Component Value Date   TSH 0.81 12/13/2021   Lab Results  Component Value Date   VITAMINB12 893 12/19/2021   No results found for: "FOLATE" Lab Results  Component Value Date   IRON 120 12/19/2021   TIBC 491.4 (H) 12/19/2021   FERRITIN 15.0 12/19/2021    Imaging and Procedures obtained prior to SNF admission: VAS [place] LOWER EXTREMITY VENOUS (DVT) (ONLY [facility])  Result Date: 10/21/2022  Lower Venous DVT Study Patient Name:  SARETTA DAHLEM  Date of Exam:   10/20/2022 Medical Rec #: [id number]        Accession #:    [id number] Date of Birth: 07-27-1935         Patient Gender: F Patient Age:   20 years Exam Location:  [facility] Procedure:      VAS [place] LOWER EXTREMITY VENOUS (DVT) Referring Phys: MATTHEW TRIFAN  --------------------------------------------------------------------------------  Indications: Pain in bilateral lower extremities- right groin, left knee, new erythema of left lower extremity x1 day. Other Indications: Remote history of DVT. Limitations: Poor ultrasound/tissue interface and patient pain/sensitivity to compressions. Comparison Study: No prior studies available. Performing Technologist: Jean Rosenthal RDMS, RVT  Examination Guidelines: A complete evaluation includes B-mode imaging, spectral Doppler, color Doppler, and power Doppler as needed of all accessible portions of each vessel. Bilateral testing is considered an integral part of a complete examination. Limited examinations for reoccurring indications may be performed as noted. The reflux portion of the exam is performed with the patient in reverse Trendelenburg.  +---------+---------------+---------+-----------+----------+-------------------+ RIGHT    CompressibilityPhasicitySpontaneityPropertiesThrombus Aging      +---------+---------------+---------+-----------+----------+-------------------+ CFV      Full           Yes      Yes                                      +---------+---------------+---------+-----------+----------+-------------------+  SFJ      Full                                                             +---------+---------------+---------+-----------+----------+-------------------+ FV Prox  Full                                                             +---------+---------------+---------+-----------+----------+-------------------+ FV Mid   Full           Yes      Yes                                      +---------+---------------+---------+-----------+----------+-------------------+ FV DistalFull           Yes      Yes                                      +---------+---------------+---------+-----------+----------+-------------------+ PFV      Full                                                              +---------+---------------+---------+-----------+----------+-------------------+ POP      Full           Yes      Yes                                      +---------+---------------+---------+-----------+----------+-------------------+ PTV      Full                                                             +---------+---------------+---------+-----------+----------+-------------------+ PERO                                                  Not well visualized +---------+---------------+---------+-----------+----------+-------------------+   +---------+---------------+---------+-----------+---------------+--------------+ LEFT     CompressibilityPhasicitySpontaneityProperties     Thrombus Aging +---------+---------------+---------+-----------+---------------+--------------+ CFV      Full           Yes      Yes                                      +---------+---------------+---------+-----------+---------------+--------------+ SFJ      Full                                                             +---------+---------------+---------+-----------+---------------+--------------+  FV Prox  Full                                                             +---------+---------------+---------+-----------+---------------+--------------+ FV Mid                  Yes      Yes        Patent by color               +---------+---------------+---------+-----------+---------------+--------------+ FV Distal               Yes      Yes        Patent by color               +---------+---------------+---------+-----------+---------------+--------------+ PFV      Full                                                             +---------+---------------+---------+-----------+---------------+--------------+ POP      Full           Yes      Yes                                       +---------+---------------+---------+-----------+---------------+--------------+ PTV      Full                                                             +---------+---------------+---------+-----------+---------------+--------------+ PERO     Full                                              Some segments                                                             not well                                                                  visualized     +---------+---------------+---------+-----------+---------------+--------------+     Summary: RIGHT: - There is no evidence of deep vein thrombosis in the lower extremity. However, portions of this examination were limited- see technologist comments above.  - No cystic structure found in the popliteal fossa.  LEFT: - There is no evidence of deep vein thrombosis  in the lower extremity. However, portions of this examination were limited- see technologist comments above.  - No cystic structure found in the popliteal fossa.  *See table(s) above for measurements and observations. Electronically signed by Gerarda Fraction on 10/21/2022 at 9:13:51 AM.    Final    CT ABDOMEN PELVIS W CONTRAST  Result Date: 10/20/2022 CLINICAL DATA:  Left lung opacity, inguinal hernia with worsening pain. EXAM: CT ANGIOGRAPHY CHEST CT ABDOMEN AND PELVIS WITH CONTRAST TECHNIQUE: Multidetector CT imaging of the chest was performed using the standard protocol during bolus administration of intravenous contrast. Multiplanar CT image reconstructions and MIPs were obtained to evaluate the vascular anatomy. Multidetector CT imaging of the abdomen and pelvis was performed using the standard protocol during bolus administration of intravenous contrast. RADIATION DOSE REDUCTION: This exam was performed according to the departmental dose-optimization program which includes automated exposure control, adjustment of the mA and/or kV according to patient size and/or use of  iterative reconstruction technique. CONTRAST:  60mL OMNIPAQUE IOHEXOL 350 MG/ML SOLN COMPARISON:  Chest radiograph performed earlier on the same date. FINDINGS: CTA CHEST FINDINGS Cardiovascular: Satisfactory opacification of the pulmonary arteries to the segmental level. No evidence of pulmonary embolism. Heart is normal in size. Coronary artery atherosclerotic calcifications. Mediastinum/Nodes: No enlarged mediastinal, hilar, or axillary lymph nodes. Thyroid and trachea are within normal limits. Large hiatal hernia with partial intrathoracic stomach. Lungs/Pleura: Lower lobe opacity with air bronchograms concerning for pneumonia. Right basilar subsegmental linear atelectasis and small right pleural effusion. Musculoskeletal: Thoracic kyphoscoliosis with multilevel degenerate disc disease. No acute chest wall abnormality. Moderate bilateral glenohumeral osteoarthritis. Review of the MIP images confirms the above findings. CT ABDOMEN and PELVIS FINDINGS Hepatobiliary: No focal liver abnormality is seen. No gallstones, gallbladder wall thickening, or biliary dilatation. Pancreas: Unremarkable. No pancreatic ductal dilatation or surrounding inflammatory changes. Spleen: Normal in size without focal abnormality. Adrenals/Urinary Tract: Adrenal glands are unremarkable. Kidneys are normal, without renal calculi, focal lesion, or hydronephrosis. Bladder is unremarkable. Multiple bilateral small renal cortical cysts, likely benign process. Stomach/Bowel: Moderate size hiatal hernia with partially intrathoracic stomach without evidence of obstruction. Small bowel loops are normal in caliber. Appendix not identified, however no inflammatory changes in the pericecal region. Sigmoid colonic diverticulosis without evidence of acute diverticulitis. Vascular/Lymphatic: Moderate aortic atherosclerotic calcification of aorta and branch vessels. No enlarged abdominal or pelvic lymph nodes. Reproductive: Status post hysterectomy. No  adnexal masses. Other: Fat containing left inguinal hernia without evidence of obstruction. There is also fat containing umbilical hernia. Nonspecific fat stranding in the left inguinal region subcutaneous soft tissues. Musculoskeletal: Moderate-to-severe levoscoliosis of the lumbar spine centered at L4 vertebral body with partial left lateral subluxation of the L3. 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 Advanced right hip osteoarthritis with near complete loss of the articular cartilage and subchondral cystic changes. Review of the MIP images confirms the above findings. IMPRESSION: CT angio chest: 1. No evidence of pulmonary embolism. 2. Left lower lobe opacity with air bronchograms concerning for pneumonia. Follow-up examination to resolution is recommended. 3. Large hiatal hernia with partial intrathoracic stomach. CT abdomen/pelvis: 1. Sigmoid colonic diverticulosis without evidence of acute diverticulitis. 2. Fat containing left inguinal hernia without evidence of obstruction. 3. Nonspecific left inguinal region fat stranding without evidence of fluid collection or abscess. Correlate with physical examination findings. 4. Moderate-to-severe levoscoliosis of the lumbar spine centered at L3 vertebral body with partial left lateral subluxation of the L4 vertebral body. Advanced multilevel degenerate  disc disease of the lumbar spine with associated multilevel facet joint arthropathy. 5. Advanced right hip osteoarthritis with near complete loss of the articular cartilage and subchondral cystic changes. 6. Aortic Atherosclerosis (ICD10-I70.0). Electronically Signed   By: Larose Hires D.O.   On: 10/20/2022 12:52   CT Angio Chest PE W and/or Wo Contrast  Result Date: 10/20/2022 CLINICAL DATA:  Left lung opacity, inguinal hernia with worsening pain. EXAM: CT ANGIOGRAPHY CHEST CT ABDOMEN AND PELVIS WITH CONTRAST TECHNIQUE: Multidetector CT  imaging of the chest was performed using the standard protocol during bolus administration of intravenous contrast. Multiplanar CT image reconstructions and MIPs were obtained to evaluate the vascular anatomy. Multidetector CT imaging of the abdomen and pelvis was performed using the standard protocol during bolus administration of intravenous contrast. RADIATION DOSE REDUCTION: This exam was performed according to the departmental dose-optimization program which includes automated exposure control, adjustment of the mA and/or kV according to patient size and/or use of iterative reconstruction technique. CONTRAST:  60mL OMNIPAQUE IOHEXOL 350 MG/ML SOLN COMPARISON:  Chest radiograph performed earlier on the same date. FINDINGS: CTA CHEST FINDINGS Cardiovascular: Satisfactory opacification of the pulmonary arteries to the segmental level. No evidence of pulmonary embolism. Heart is normal in size. Coronary artery atherosclerotic calcifications. Mediastinum/Nodes: No enlarged mediastinal, hilar, or axillary lymph nodes. Thyroid and trachea are within normal limits. Large hiatal hernia with partial intrathoracic stomach. Lungs/Pleura: Lower lobe opacity with air bronchograms concerning for pneumonia. Right basilar subsegmental linear atelectasis and small right pleural effusion. Musculoskeletal: Thoracic kyphoscoliosis with multilevel degenerate disc disease. No acute chest wall abnormality. Moderate bilateral glenohumeral osteoarthritis. Review of the MIP images confirms the above findings. CT ABDOMEN and PELVIS FINDINGS Hepatobiliary: No focal liver abnormality is seen. No gallstones, gallbladder wall thickening, or biliary dilatation. Pancreas: Unremarkable. No pancreatic ductal dilatation or surrounding inflammatory changes. Spleen: Normal in size without focal abnormality. Adrenals/Urinary Tract: Adrenal glands are unremarkable. Kidneys are normal, without renal calculi, focal lesion, or hydronephrosis. Bladder is  unremarkable. Multiple bilateral small renal cortical cysts, likely benign process. Stomach/Bowel: Moderate size hiatal hernia with partially intrathoracic stomach without evidence of obstruction. Small bowel loops are normal in caliber. Appendix not identified, however no inflammatory changes in the pericecal region. Sigmoid colonic diverticulosis without evidence of acute diverticulitis. Vascular/Lymphatic: Moderate aortic atherosclerotic calcification of aorta and branch vessels. No enlarged abdominal or pelvic lymph nodes. Reproductive: Status post hysterectomy. No adnexal masses. Other: Fat containing left inguinal hernia without evidence of obstruction. There is also fat containing umbilical hernia. Nonspecific fat stranding in the left inguinal region subcutaneous soft tissues. Musculoskeletal: Moderate-to-severe levoscoliosis of the lumbar spine centered at L4 vertebral body with partial left lateral subluxation of the L3. 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 Advanced right hip osteoarthritis with near complete loss of the articular cartilage and subchondral cystic changes. Review of the MIP images confirms the above findings. IMPRESSION: CT angio chest: 1. No evidence of pulmonary embolism. 2. Left lower lobe opacity with air bronchograms concerning for pneumonia. Follow-up examination to resolution is recommended. 3. Large hiatal hernia with partial intrathoracic stomach. CT abdomen/pelvis: 1. Sigmoid colonic diverticulosis without evidence of acute diverticulitis. 2. Fat containing left inguinal hernia without evidence of obstruction. 3. Nonspecific left inguinal region fat stranding without evidence of fluid collection or abscess. Correlate with physical examination findings. 4. Moderate-to-severe levoscoliosis of the lumbar spine centered at L3 vertebral body with partial left lateral subluxation of the L4 vertebral body.  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 5. Advanced right hip osteoarthritis with near complete loss of the articular cartilage and subchondral cystic changes. 6. Aortic Atherosclerosis (ICD10-I70.0). Electronically Signed   By: Larose Hires D.O.   On: 10/20/2022 12:52   DG Chest 2 View  Result Date: 10/20/2022 CLINICAL DATA:  Provided history: Weakness. Cough. Pneumonia evaluation. EXAM: CHEST - 2 VIEW COMPARISON:  Chest radiographs 07/05/2021 and earlier. CT abdomen/pelvis 08/20/2012. FINDINGS: Heart size within normal limits. Aortic atherosclerosis. Large hiatal hernia. Ill-defined opacity within the mid to lower left lung. Small left pleural effusion. No appreciable airspace consolidation on the right. No evidence of pneumothorax. No acute osseous abnormality identified. Degenerative changes of the spine and bilateral glenohumeral joints. Chronic appearing deformity of the metadiaphysis of the proximal right humerus. IMPRESSION: 1. Ill-defined opacity within the mid to lower left lung, which may reflect atelectasis and/or pneumonia. 2. Small left pleural effusion. 3. Large hiatal hernia. 4.  Aortic Atherosclerosis (ICD10-I70.0). Electronically Signed   By: Jackey Loge D.O.   On: 10/20/2022 10:14    Assessment/Plan There are no diagnoses linked to this encounter.   Family/ staff Communication:   Labs/tests ordered:

## 2022-11-06 ENCOUNTER — Encounter: Payer: Self-pay | Admitting: Student

## 2022-11-06 ENCOUNTER — Non-Acute Institutional Stay (SKILLED_NURSING_FACILITY): Payer: PPO | Admitting: Student

## 2022-11-06 DIAGNOSIS — I10 Essential (primary) hypertension: Secondary | ICD-10-CM

## 2022-11-06 DIAGNOSIS — M25473 Effusion, unspecified ankle: Secondary | ICD-10-CM

## 2022-11-06 DIAGNOSIS — E669 Obesity, unspecified: Secondary | ICD-10-CM | POA: Diagnosis not present

## 2022-11-06 DIAGNOSIS — M170 Bilateral primary osteoarthritis of knee: Secondary | ICD-10-CM

## 2022-11-06 NOTE — Progress Notes (Unsigned)
Location:  Other [place].  Nursing Home Room Number: [facility] [address] Place of Service:  SNF [phone]) Provider:  Dr. Earnestine Mealing  PCP: Doreene Nest, NP  Patient Care Team: Doreene Nest, NP as PCP - General (Internal Medicine) Pricilla Riffle, MD as PCP - Cardiology (Cardiology) Kathyrn Sheriff, [facility] as Pharmacist (Pharmacist)  Extended Emergency Contact Information Primary Emergency Contact: [facility] Address: [address]          [place], [place] [address] [place] Home Phone: [phone] Mobile Phone: [phone] Relation: Daughter Secondary Emergency Contact: MERRITT,PATRICIA Address: [address] Home Phone: [phone] Mobile Phone: [phone] Relation: Daughter  Code Status:  DNR Goals of care: Advanced Directive information    11/06/2022    1:40 PM  Advanced Directives  Does Patient Have a Medical Advance Directive? Yes  Type of Estate agent of [place];Out of facility DNR (pink MOST or yellow form)  Does patient want to make changes to medical advance directive? No - Patient declined  Copy of Healthcare Power of Attorney in Chart? Yes - validated most recent copy scanned in chart (See row information)     Chief Complaint  Patient presents with   Acute Visit    Leg Swelling and High Blood Pressure.     HPI:  Pt is a 87 y.o. female seen today for an acute visit for ankle swelling and elevated blood pressure. Patient has had numerous values >150/90. Her legs have continued to swell. She is inconsistent in elevating her legs. Patient states her feet al  Patient's daughter is at bedside.    Past Medical History:  Diagnosis Date   Anxiety state, unspecified    Arthritis    Atherosclerosis    Cancer (HCC)    skin cancer   Diverticulosis    Esophageal stricture    Essential hypertension, benign    Gastric ulcer    Hiatal hernia    History of DVT (deep vein thrombosis) yrs ago   legs    IDA (iron deficiency anemia)    Inguinal hernia without mention of obstruction or gangrene, unilateral or unspecified, (not specified as recurrent)    2014   Internal hemorrhoids    Mild mitral regurgitation    a. 04/2010 Echo: nl EF, mild diast dysfxn, trace PR, mild TR, mild-mod MR.   Non-obstructive CAD    a. 04/2010 Myoview: EF 77%, fixed apical/inferior defect;  b. 04/2010 Cath: LAD 18m, LCX/RCA minor irregs, EF 60%;  c. 04/2011 Myoview: low risk.   Pure hypercholesterolemia    Rupture of left patellar tendon    05-26-2019   Scoliosis    UI (urinary incontinence)    Umbilical hernia without mention of obstruction or gangrene    2014   Unspecified hypothyroidism    Vitamin D deficiency    Past Surgical History:  Procedure Laterality Date   ABDOMINAL HYSTERECTOMY  2008   complete    BIOPSY  04/02/2018   Procedure: BIOPSY;  Surgeon: Lemar Lofty., MD;  Location: [facility];  Service: Gastroenterology;;   CARDIAC CATHETERIZATION  2011   [facility]   COLONOSCOPY  2003   [facility]   ESOPHAGOGASTRODUODENOSCOPY (EGD) WITH PROPOFOL N/A 04/02/2018   Procedure: ESOPHAGOGASTRODUODENOSCOPY (EGD) WITH PROPOFOL;  Surgeon: Lemar Lofty., MD;  Location: [facility];  Service: Gastroenterology;  Laterality: N/A;   FOREIGN BODY REMOVAL  04/02/2018   Procedure: FOREIGN BODY REMOVAL;  Surgeon: Corliss Parish  Montez Hageman., MD;  Location: [facility];  Service: Gastroenterology;;   HERNIA REPAIR Right 09/23/2012   repair RIH   LAPAROSCOPIC HYSTERECTOMY     PATELLAR TENDON REPAIR Left 06/03/2019   Procedure: LEFT PATELLA TENDON REPAIR, RIGHT KNEE INJECTION.;  Surgeon: Durene Romans, MD;  Location: [facility];  Service: Orthopedics;  Laterality: Left;   PATELLAR TENDON REPAIR Left 07/01/2019   Procedure: OPEN PATELLA TENDON REPAIR;  Surgeon: Durene Romans, MD;  Location: [facility];  Service: Orthopedics;  Laterality: Left;   TONSILLECTOMY     TOTAL KNEE ARTHROPLASTY Left 02/11/2019    Procedure: TOTAL KNEE ARTHROPLASTY;  Surgeon: Durene Romans, MD;  Location: [facility];  Service: Orthopedics;  Laterality: Left;  70 mins    Allergies  Allergen Reactions   Codeine Nausea Only    Outpatient Encounter Medications as of 11/06/2022  Medication Sig   acetaminophen (TYLENOL) 325 MG tablet Take 650 mg by mouth every 6 (six) hours as needed.   alum & mag hydroxide-simeth (MAALOX PLUS) 400-400-40 MG/5ML suspension Take 5 mLs by mouth every 4 (four) hours as needed for indigestion.   aspirin EC 81 MG tablet Take 1 tablet (81 mg total) by mouth daily. Swallow whole. Aspirin & plavix for 21 days, f/by aspirin alone   bisacodyl (DULCOLAX) 10 MG suppository Place 10 mg rectally as needed for moderate constipation.   Calcium-Magnesium-Vitamin D (CALCIUM 1200+D3 PO) Take 1 tablet by mouth daily.   enalapril (VASOTEC) 10 MG tablet Take 1 tablet (10 mg total) by mouth daily. for blood pressure.   hydrochlorothiazide (HYDRODIURIL) 25 MG tablet Take 25 mg by mouth daily.   levothyroxine (SYNTHROID) 88 MCG tablet Take 1 tablet by mouth every morning on an empty stomach with water only.  No food or other medications for 30 minutes.   lidocaine (LIDODERM) 5 % Apply to both shoulders, Both knees, right hip topically in the morning for pain. Remove & Discard patch within 12 hours or as directed by MD   metoprolol tartrate (LOPRESSOR) 25 MG tablet Take 12.5 mg by mouth 2 (two) times daily.   Multiple Vitamin (MULTIVITAMIN PO) Take 1 tablet by mouth daily.   omeprazole (PRILOSEC) 40 MG capsule Take 40 mg by mouth daily.   polyethylene glycol (MIRALAX / GLYCOLAX) 17 g packet Take 17 g by mouth daily.   simvastatin (ZOCOR) 20 MG tablet Take 1 tablet (20 mg total) by mouth every evening. for cholesterol.   traMADol (ULTRAM) 50 MG tablet Take 50 mg by mouth every 6 (six) hours as needed.   Zinc Oxide (TRIPLE PASTE) 12.8 % ointment Apply 1 Application topically as needed for irritation.   [DISCONTINUED]  acetaminophen (TYLENOL) 500 MG tablet Take 2 tablets (1,000 mg total) by mouth every 8 (eight) hours. (Patient taking differently: Take 1,000 mg by mouth every 6 (six) hours as needed.)   [DISCONTINUED] amoxicillin-clavulanate (AUGMENTIN) 875-125 MG tablet Take 1 tablet by mouth every 12 (twelve) hours.   [DISCONTINUED] metoprolol tartrate (LOPRESSOR) 25 MG tablet TAKE 1 TABLET BY MOUTH EVERY EVENING (Patient taking differently: Take 12.5 mg by mouth 2 (two) times daily.)   [DISCONTINUED] traMADol (ULTRAM) 50 MG tablet Take 1 tablet (50 mg total) by mouth 2 (two) times daily as needed for severe pain. For moderate pain. (Patient taking differently: Take 50 mg by mouth every 6 (six) hours as needed for severe pain. For moderate pain.)   No facility-administered encounter medications on file as of 11/06/2022.    Review of Systems  Immunization History  Administered Date(s) Administered   Influenza Split 04/02/2012   Influenza, High Dose Seasonal PF 04/16/2017   Influenza,inj,Quad PF,6+ Mos 04/29/2013, 04/18/2018, 04/29/2019, 04/19/2020   Influenza-Unspecified 04/30/2016, 04/13/2022   PFIZER(Purple Top)SARS-COV-2 Vaccination 09/24/2019, 10/15/2019   Pneumococcal Conjugate-13 04/29/2019   Pneumococcal Polysaccharide-23 04/18/2018   RSV,unspecified 04/13/2022   Pertinent  Health Maintenance Due  Topic Date Due   INFLUENZA VACCINE  02/08/2023   DEXA SCAN  Discontinued      12/13/2021    8:58 AM 12/14/2021    1:18 PM 06/11/2022   11:17 AM 06/11/2022   11:13 PM 10/13/2022   12:08 PM  Fall Risk  Falls in the past year? 0 0   0  Was there an injury with Fall? 0 0   0  Fall Risk Category Calculator 0 0   0  Fall Risk Category (Retired) Low Low     (RETIRED) Patient Fall Risk Level Moderate fall risk Low fall risk High fall risk High fall risk   Patient at Risk for Falls Due to Impaired balance/gait;Impaired mobility No Fall Risks   No Fall Risks  Fall risk Follow up  Falls prevention discussed    Falls evaluation completed   Functional Status Survey:    Vitals:   11/06/22 1304  BP: 125/76  Pulse: 66  Resp: 20  Temp: (!) 97.4 F (36.3 C)  SpO2: 97%  Weight: 158 lb 12.8 oz (72 kg)  Height: 4\' 11"  (1.499 m)   Body mass index is 32.07 kg/m. Physical Exam Constitutional:      Appearance: She is obese.  Neurological:     Mental Status: She is alert.     Labs reviewed: Recent Labs    10/20/22 0927 10/20/22 1812 10/21/22 1610 10/22/22 0146 10/23/22 0150 10/24/22 0603 10/25/22 0850  NA 125*   < > 126*   < > 126* 128* 130*  K 4.2   < > 3.9   < > 3.5 3.2* 4.2  CL 91*   < > 94*   < > 93* 93* 97*  CO2 22   < > 18*   < > 20* 21* 21*  GLUCOSE 64*   < > 81   < > 107* 98 76  BUN 61*   < > 53*   < > 51* 43* 32*  CREATININE 1.31*   < > 1.29*   < > 1.27* 1.17* 0.97  CALCIUM 8.4*   < > 7.7*   < > 7.9* 8.0* 8.2*  MG 1.6*  --  2.1  --   --   --   --    < > = values in this interval not displayed.   Recent Labs    12/13/21 0926 06/11/22 1117  AST 17 21  ALT 8 12  ALKPHOS 70 65  BILITOT 0.9 0.8  PROT 6.6 7.5  ALBUMIN 4.3 4.3   Recent Labs    10/23/22 0150 10/24/22 0603 10/25/22 0850  WBC 13.3* 15.4* 10.1  NEUTROABS 11.4* 10.1* 6.0  HGB 8.6* 8.3* 9.3*  HCT 23.9* 22.8* 27.4*  MCV 84.8 83.8 87.8  PLT 134* 154 175   Lab Results  Component Value Date   TSH 0.81 12/13/2021   Lab Results  Component Value Date   HGBA1C 5.5 06/11/2022   Lab Results  Component Value Date   CHOL 137 06/12/2022   HDL 61 06/12/2022   LDLCALC 69 06/12/2022   LDLDIRECT 180.2 07/17/2013   TRIG 34 06/12/2022   CHOLHDL 2.2 06/12/2022  Significant Diagnostic Results in last 30 days:  VAS [place] LOWER EXTREMITY VENOUS (DVT) (ONLY [facility])  Result Date: 10/21/2022  Lower Venous DVT Study Patient Name:  SHAYANNE GOMM  Date of Exam:   10/20/2022 Medical Rec #: [id number]        Accession #:    [id number] Date of Birth: Nov 16, 1935         Patient Gender: F Patient Age:   49 years Exam  [facility] Procedure:      VAS [place] LOWER EXTREMITY VENOUS (DVT) Referring Phys: MATTHEW TRIFAN --------------------------------------------------------------------------------  Indications: Pain in bilateral lower extremities- right groin, left knee, new erythema of left lower extremity x1 day. Other Indications: Remote history of DVT. Limitations: Poor ultrasound/tissue interface and patient pain/sensitivity to compressions. Comparison Study: No prior studies available. Performing Technologist: Jean Rosenthal RDMS, RVT  Examination Guidelines: A complete evaluation includes B-mode imaging, spectral Doppler, color Doppler, and power Doppler as needed of all accessible portions of each vessel. Bilateral testing is considered an integral part of a complete examination. Limited examinations for reoccurring indications may be performed as noted. The reflux portion of the exam is performed with the patient in reverse Trendelenburg.  +---------+---------------+---------+-----------+----------+-------------------+ RIGHT    CompressibilityPhasicitySpontaneityPropertiesThrombus Aging      +---------+---------------+---------+-----------+----------+-------------------+ CFV      Full           Yes      Yes                                      +---------+---------------+---------+-----------+----------+-------------------+ SFJ      Full                                                             +---------+---------------+---------+-----------+----------+-------------------+ FV Prox  Full                                                             +---------+---------------+---------+-----------+----------+-------------------+ FV Mid   Full           Yes      Yes                                      +---------+---------------+---------+-----------+----------+-------------------+ FV DistalFull           Yes      Yes                                       +---------+---------------+---------+-----------+----------+-------------------+ PFV      Full                                                             +---------+---------------+---------+-----------+----------+-------------------+  POP      Full           Yes      Yes                                      +---------+---------------+---------+-----------+----------+-------------------+ PTV      Full                                                             +---------+---------------+---------+-----------+----------+-------------------+ PERO                                                  Not well visualized +---------+---------------+---------+-----------+----------+-------------------+   +---------+---------------+---------+-----------+---------------+--------------+ LEFT     CompressibilityPhasicitySpontaneityProperties     Thrombus Aging +---------+---------------+---------+-----------+---------------+--------------+ CFV      Full           Yes      Yes                                      +---------+---------------+---------+-----------+---------------+--------------+ SFJ      Full                                                             +---------+---------------+---------+-----------+---------------+--------------+ FV Prox  Full                                                             +---------+---------------+---------+-----------+---------------+--------------+ FV Mid                  Yes      Yes        Patent by color               +---------+---------------+---------+-----------+---------------+--------------+ FV Distal               Yes      Yes        Patent by color               +---------+---------------+---------+-----------+---------------+--------------+ PFV      Full                                                             +---------+---------------+---------+-----------+---------------+--------------+  POP      Full           Yes      Yes                                      +---------+---------------+---------+-----------+---------------+--------------+  PTV      Full                                                             +---------+---------------+---------+-----------+---------------+--------------+ PERO     Full                                              Some segments                                                             not well                                                                  visualized     +---------+---------------+---------+-----------+---------------+--------------+     Summary: RIGHT: - There is no evidence of deep vein thrombosis in the lower extremity. However, portions of this examination were limited- see technologist comments above.  - No cystic structure found in the popliteal fossa.  LEFT: - There is no evidence of deep vein thrombosis in the lower extremity. However, portions of this examination were limited- see technologist comments above.  - No cystic structure found in the popliteal fossa.  *See table(s) above for measurements and observations. Electronically signed by Gerarda Fraction on 10/21/2022 at 9:13:51 AM.    Final    CT ABDOMEN PELVIS W CONTRAST  Result Date: 10/20/2022 CLINICAL DATA:  Left lung opacity, inguinal hernia with worsening pain. EXAM: CT ANGIOGRAPHY CHEST CT ABDOMEN AND PELVIS WITH CONTRAST TECHNIQUE: Multidetector CT imaging of the chest was performed using the standard protocol during bolus administration of intravenous contrast. Multiplanar CT image reconstructions and MIPs were obtained to evaluate the vascular anatomy. Multidetector CT imaging of the abdomen and pelvis was performed using the standard protocol during bolus administration of intravenous contrast. RADIATION DOSE REDUCTION: This exam was performed according to the departmental dose-optimization program which includes automated exposure  control, adjustment of the mA and/or kV according to patient size and/or use of iterative reconstruction technique. CONTRAST:  60mL OMNIPAQUE IOHEXOL 350 MG/ML SOLN COMPARISON:  Chest radiograph performed earlier on the same date. FINDINGS: CTA CHEST FINDINGS Cardiovascular: Satisfactory opacification of the pulmonary arteries to the segmental level. No evidence of pulmonary embolism. Heart is normal in size. Coronary artery atherosclerotic calcifications. Mediastinum/Nodes: No enlarged mediastinal, hilar, or axillary lymph nodes. Thyroid and trachea are within normal limits. Large hiatal hernia with partial intrathoracic stomach. Lungs/Pleura: Lower lobe opacity with air bronchograms concerning for pneumonia. Right basilar subsegmental linear atelectasis and small right pleural effusion. Musculoskeletal: Thoracic kyphoscoliosis with multilevel degenerate disc disease. No acute chest wall abnormality. Moderate bilateral glenohumeral osteoarthritis. Review of the MIP images confirms the above findings. CT ABDOMEN and PELVIS FINDINGS Hepatobiliary: No focal liver abnormality is seen.  No gallstones, gallbladder wall thickening, or biliary dilatation. Pancreas: Unremarkable. No pancreatic ductal dilatation or surrounding inflammatory changes. Spleen: Normal in size without focal abnormality. Adrenals/Urinary Tract: Adrenal glands are unremarkable. Kidneys are normal, without renal calculi, focal lesion, or hydronephrosis. Bladder is unremarkable. Multiple bilateral small renal cortical cysts, likely benign process. Stomach/Bowel: Moderate size hiatal hernia with partially intrathoracic stomach without evidence of obstruction. Small bowel loops are normal in caliber. Appendix not identified, however no inflammatory changes in the pericecal region. Sigmoid colonic diverticulosis without evidence of acute diverticulitis. Vascular/Lymphatic: Moderate aortic atherosclerotic calcification of aorta and branch vessels. No  enlarged abdominal or pelvic lymph nodes. Reproductive: Status post hysterectomy. No adnexal masses. Other: Fat containing left inguinal hernia without evidence of obstruction. There is also fat containing umbilical hernia. Nonspecific fat stranding in the left inguinal region subcutaneous soft tissues. Musculoskeletal: Moderate-to-severe levoscoliosis of the lumbar spine centered at L4 vertebral body with partial left lateral subluxation of the L3. 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 Advanced right hip osteoarthritis with near complete loss of the articular cartilage and subchondral cystic changes. Review of the MIP images confirms the above findings. IMPRESSION: CT angio chest: 1. No evidence of pulmonary embolism. 2. Left lower lobe opacity with air bronchograms concerning for pneumonia. Follow-up examination to resolution is recommended. 3. Large hiatal hernia with partial intrathoracic stomach. CT abdomen/pelvis: 1. Sigmoid colonic diverticulosis without evidence of acute diverticulitis. 2. Fat containing left inguinal hernia without evidence of obstruction. 3. Nonspecific left inguinal region fat stranding without evidence of fluid collection or abscess. Correlate with physical examination findings. 4. Moderate-to-severe levoscoliosis of the lumbar spine centered at L3 vertebral body with partial left lateral subluxation of the L4 vertebral body. 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 5. Advanced right hip osteoarthritis with near complete loss of the articular cartilage and subchondral cystic changes. 6. Aortic Atherosclerosis (ICD10-I70.0). Electronically Signed   By: Larose Hires D.O.   On: 10/20/2022 12:52   CT Angio Chest PE W and/or Wo Contrast  Result Date: 10/20/2022 CLINICAL DATA:  Left lung opacity, inguinal hernia with worsening pain. EXAM:  CT ANGIOGRAPHY CHEST CT ABDOMEN AND PELVIS WITH CONTRAST TECHNIQUE: Multidetector CT imaging of the chest was performed using the standard protocol during bolus administration of intravenous contrast. Multiplanar CT image reconstructions and MIPs were obtained to evaluate the vascular anatomy. Multidetector CT imaging of the abdomen and pelvis was performed using the standard protocol during bolus administration of intravenous contrast. RADIATION DOSE REDUCTION: This exam was performed according to the departmental dose-optimization program which includes automated exposure control, adjustment of the mA and/or kV according to patient size and/or use of iterative reconstruction technique. CONTRAST:  60mL OMNIPAQUE IOHEXOL 350 MG/ML SOLN COMPARISON:  Chest radiograph performed earlier on the same date. FINDINGS: CTA CHEST FINDINGS Cardiovascular: Satisfactory opacification of the pulmonary arteries to the segmental level. No evidence of pulmonary embolism. Heart is normal in size. Coronary artery atherosclerotic calcifications. Mediastinum/Nodes: No enlarged mediastinal, hilar, or axillary lymph nodes. Thyroid and trachea are within normal limits. Large hiatal hernia with partial intrathoracic stomach. Lungs/Pleura: Lower lobe opacity with air bronchograms concerning for pneumonia. Right basilar subsegmental linear atelectasis and small right pleural effusion. Musculoskeletal: Thoracic kyphoscoliosis with multilevel degenerate disc disease. No acute chest wall abnormality. Moderate bilateral glenohumeral osteoarthritis. Review of the MIP images confirms the above findings. CT ABDOMEN and PELVIS FINDINGS Hepatobiliary: No focal liver abnormality  is seen. No gallstones, gallbladder wall thickening, or biliary dilatation. Pancreas: Unremarkable. No pancreatic ductal dilatation or surrounding inflammatory changes. Spleen: Normal in size without focal abnormality. Adrenals/Urinary Tract: Adrenal glands are unremarkable.  Kidneys are normal, without renal calculi, focal lesion, or hydronephrosis. Bladder is unremarkable. Multiple bilateral small renal cortical cysts, likely benign process. Stomach/Bowel: Moderate size hiatal hernia with partially intrathoracic stomach without evidence of obstruction. Small bowel loops are normal in caliber. Appendix not identified, however no inflammatory changes in the pericecal region. Sigmoid colonic diverticulosis without evidence of acute diverticulitis. Vascular/Lymphatic: Moderate aortic atherosclerotic calcification of aorta and branch vessels. No enlarged abdominal or pelvic lymph nodes. Reproductive: Status post hysterectomy. No adnexal masses. Other: Fat containing left inguinal hernia without evidence of obstruction. There is also fat containing umbilical hernia. Nonspecific fat stranding in the left inguinal region subcutaneous soft tissues. Musculoskeletal: Moderate-to-severe levoscoliosis of the lumbar spine centered at L4 vertebral body with partial left lateral subluxation of the L3. 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 Advanced right hip osteoarthritis with near complete loss of the articular cartilage and subchondral cystic changes. Review of the MIP images confirms the above findings. IMPRESSION: CT angio chest: 1. No evidence of pulmonary embolism. 2. Left lower lobe opacity with air bronchograms concerning for pneumonia. Follow-up examination to resolution is recommended. 3. Large hiatal hernia with partial intrathoracic stomach. CT abdomen/pelvis: 1. Sigmoid colonic diverticulosis without evidence of acute diverticulitis. 2. Fat containing left inguinal hernia without evidence of obstruction. 3. Nonspecific left inguinal region fat stranding without evidence of fluid collection or abscess. Correlate with physical examination findings. 4. Moderate-to-severe levoscoliosis of the lumbar spine centered  at L3 vertebral body with partial left lateral subluxation of the L4 vertebral body. 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 5. Advanced right hip osteoarthritis with near complete loss of the articular cartilage and subchondral cystic changes. 6. Aortic Atherosclerosis (ICD10-I70.0). Electronically Signed   By: Larose Hires D.O.   On: 10/20/2022 12:52   DG Chest 2 View  Result Date: 10/20/2022 CLINICAL DATA:  Provided history: Weakness. Cough. Pneumonia evaluation. EXAM: CHEST - 2 VIEW COMPARISON:  Chest radiographs 07/05/2021 and earlier. CT abdomen/pelvis 08/20/2012. FINDINGS: Heart size within normal limits. Aortic atherosclerosis. Large hiatal hernia. Ill-defined opacity within the mid to lower left lung. Small left pleural effusion. No appreciable airspace consolidation on the right. No evidence of pneumothorax. No acute osseous abnormality identified. Degenerative changes of the spine and bilateral glenohumeral joints. Chronic appearing deformity of the metadiaphysis of the proximal right humerus. IMPRESSION: 1. Ill-defined opacity within the mid to lower left lung, which may reflect atelectasis and/or pneumonia. 2. Small left pleural effusion. 3. Large hiatal hernia. 4.  Aortic Atherosclerosis (ICD10-I70.0). Electronically Signed   By: Jackey Loge D.O.   On: 10/20/2022 10:14    Assessment/Plan Obesity (BMI 30-39.9)  Primary osteoarthritis of both knees  Primary hypertension  Ankle swelling, unspecified laterality Discussed importance of participation in physical therapy due to weight and mobilization of fluid and improvement of pain. Given elevated BP will plan to restart HCTZ 25 mg at previous dosing. Plan for BMP in 1 week.    Family/ staff Communication: Daughter  Labs/tests ordered:  BMP 1 week

## 2022-11-07 LAB — CULTURE, BLOOD (ROUTINE X 2): Special Requests: ADEQUATE

## 2022-11-08 LAB — CULTURE, BLOOD (ROUTINE X 2)

## 2022-11-13 ENCOUNTER — Other Ambulatory Visit: Payer: Self-pay | Admitting: Student

## 2022-11-13 DIAGNOSIS — M170 Bilateral primary osteoarthritis of knee: Secondary | ICD-10-CM

## 2022-11-13 MED ORDER — TRAMADOL HCL 50 MG PO TABS
50.0000 mg | ORAL_TABLET | Freq: Four times a day (QID) | ORAL | 0 refills | Status: DC | PRN
Start: 2022-11-13 — End: 2023-02-01

## 2022-11-13 NOTE — Progress Notes (Signed)
Refill chronic medication.

## 2022-11-20 ENCOUNTER — Telehealth: Payer: Self-pay | Admitting: Primary Care

## 2022-11-20 DIAGNOSIS — E669 Obesity, unspecified: Secondary | ICD-10-CM | POA: Diagnosis not present

## 2022-11-20 DIAGNOSIS — I2510 Atherosclerotic heart disease of native coronary artery without angina pectoris: Secondary | ICD-10-CM | POA: Diagnosis not present

## 2022-11-20 DIAGNOSIS — K648 Other hemorrhoids: Secondary | ICD-10-CM | POA: Diagnosis not present

## 2022-11-20 DIAGNOSIS — M1611 Unilateral primary osteoarthritis, right hip: Secondary | ICD-10-CM | POA: Diagnosis not present

## 2022-11-20 DIAGNOSIS — M25511 Pain in right shoulder: Secondary | ICD-10-CM | POA: Diagnosis not present

## 2022-11-20 DIAGNOSIS — E871 Hypo-osmolality and hyponatremia: Secondary | ICD-10-CM | POA: Diagnosis not present

## 2022-11-20 DIAGNOSIS — K4090 Unilateral inguinal hernia, without obstruction or gangrene, not specified as recurrent: Secondary | ICD-10-CM | POA: Diagnosis not present

## 2022-11-20 DIAGNOSIS — M4186 Other forms of scoliosis, lumbar region: Secondary | ICD-10-CM | POA: Diagnosis not present

## 2022-11-20 DIAGNOSIS — I051 Rheumatic mitral insufficiency: Secondary | ICD-10-CM | POA: Diagnosis not present

## 2022-11-20 DIAGNOSIS — G8929 Other chronic pain: Secondary | ICD-10-CM | POA: Diagnosis not present

## 2022-11-20 DIAGNOSIS — D509 Iron deficiency anemia, unspecified: Secondary | ICD-10-CM | POA: Diagnosis not present

## 2022-11-20 DIAGNOSIS — A419 Sepsis, unspecified organism: Secondary | ICD-10-CM | POA: Diagnosis not present

## 2022-11-20 DIAGNOSIS — I700 Atherosclerosis of aorta: Secondary | ICD-10-CM | POA: Diagnosis not present

## 2022-11-20 DIAGNOSIS — K5730 Diverticulosis of large intestine without perforation or abscess without bleeding: Secondary | ICD-10-CM | POA: Diagnosis not present

## 2022-11-20 DIAGNOSIS — L03116 Cellulitis of left lower limb: Secondary | ICD-10-CM | POA: Diagnosis not present

## 2022-11-20 DIAGNOSIS — I129 Hypertensive chronic kidney disease with stage 1 through stage 4 chronic kidney disease, or unspecified chronic kidney disease: Secondary | ICD-10-CM | POA: Diagnosis not present

## 2022-11-20 DIAGNOSIS — E876 Hypokalemia: Secondary | ICD-10-CM | POA: Diagnosis not present

## 2022-11-20 DIAGNOSIS — F411 Generalized anxiety disorder: Secondary | ICD-10-CM | POA: Diagnosis not present

## 2022-11-20 DIAGNOSIS — M25512 Pain in left shoulder: Secondary | ICD-10-CM | POA: Diagnosis not present

## 2022-11-20 DIAGNOSIS — M1711 Unilateral primary osteoarthritis, right knee: Secondary | ICD-10-CM | POA: Diagnosis not present

## 2022-11-20 DIAGNOSIS — M5136 Other intervertebral disc degeneration, lumbar region: Secondary | ICD-10-CM | POA: Diagnosis not present

## 2022-11-20 DIAGNOSIS — J188 Other pneumonia, unspecified organism: Secondary | ICD-10-CM | POA: Diagnosis not present

## 2022-11-20 DIAGNOSIS — J189 Pneumonia, unspecified organism: Secondary | ICD-10-CM | POA: Diagnosis not present

## 2022-11-20 DIAGNOSIS — N1831 Chronic kidney disease, stage 3a: Secondary | ICD-10-CM | POA: Diagnosis not present

## 2022-11-20 DIAGNOSIS — M25562 Pain in left knee: Secondary | ICD-10-CM | POA: Diagnosis not present

## 2022-11-20 NOTE — Telephone Encounter (Signed)
Approved.  

## 2022-11-20 NOTE — Telephone Encounter (Signed)
Home Health verbal orders Caller Name: Clearence Cheek Agency Name: Melony Overly number: [phone]  Requesting PT  Frequency: 1x week for 5 weeks/ 1x every other week for 4 weeks  Please forward to [facility] pool or providers CMA

## 2022-11-21 NOTE — Telephone Encounter (Signed)
Called and advised Jatana with [facility] of the approval of the requested verbal orders for this patient. Advised to call back with any further questions.

## 2022-11-22 ENCOUNTER — Ambulatory Visit (INDEPENDENT_AMBULATORY_CARE_PROVIDER_SITE_OTHER): Payer: PPO | Admitting: Primary Care

## 2022-11-22 ENCOUNTER — Encounter: Payer: Self-pay | Admitting: Primary Care

## 2022-11-22 VITALS — BP 126/84 | HR 76 | Temp 98.4°F | Ht 59.0 in | Wt 154.0 lb

## 2022-11-22 DIAGNOSIS — M170 Bilateral primary osteoarthritis of knee: Secondary | ICD-10-CM

## 2022-11-22 DIAGNOSIS — E039 Hypothyroidism, unspecified: Secondary | ICD-10-CM

## 2022-11-22 DIAGNOSIS — E871 Hypo-osmolality and hyponatremia: Secondary | ICD-10-CM | POA: Diagnosis not present

## 2022-11-22 DIAGNOSIS — F411 Generalized anxiety disorder: Secondary | ICD-10-CM

## 2022-11-22 DIAGNOSIS — M19011 Primary osteoarthritis, right shoulder: Secondary | ICD-10-CM

## 2022-11-22 DIAGNOSIS — K2100 Gastro-esophageal reflux disease with esophagitis, without bleeding: Secondary | ICD-10-CM

## 2022-11-22 DIAGNOSIS — E785 Hyperlipidemia, unspecified: Secondary | ICD-10-CM

## 2022-11-22 DIAGNOSIS — N1831 Chronic kidney disease, stage 3a: Secondary | ICD-10-CM | POA: Diagnosis not present

## 2022-11-22 DIAGNOSIS — K222 Esophageal obstruction: Secondary | ICD-10-CM | POA: Diagnosis not present

## 2022-11-22 DIAGNOSIS — N3941 Urge incontinence: Secondary | ICD-10-CM

## 2022-11-22 DIAGNOSIS — M19012 Primary osteoarthritis, left shoulder: Secondary | ICD-10-CM

## 2022-11-22 DIAGNOSIS — I2510 Atherosclerotic heart disease of native coronary artery without angina pectoris: Secondary | ICD-10-CM | POA: Diagnosis not present

## 2022-11-22 DIAGNOSIS — J189 Pneumonia, unspecified organism: Secondary | ICD-10-CM

## 2022-11-22 DIAGNOSIS — I10 Essential (primary) hypertension: Secondary | ICD-10-CM

## 2022-11-22 DIAGNOSIS — R002 Palpitations: Secondary | ICD-10-CM

## 2022-11-22 MED ORDER — OMEPRAZOLE 40 MG PO CPDR
40.0000 mg | DELAYED_RELEASE_CAPSULE | Freq: Every day | ORAL | 3 refills | Status: DC
Start: 2022-11-22 — End: 2023-11-20

## 2022-11-22 NOTE — Assessment & Plan Note (Signed)
Stable.  No concerns today. Remain off oxybutynin.

## 2022-11-22 NOTE — Assessment & Plan Note (Signed)
Controlled.  Continue metoprolol tartrate 12.5 mg BID. 

## 2022-11-22 NOTE — Assessment & Plan Note (Signed)
Controlled.  Continue enalapril-HCTZ 10-25 mg daily for now. BMP pending.

## 2022-11-22 NOTE — Assessment & Plan Note (Signed)
Repeat lipid panel pending. Continue simvastatin 20 mg daily.  

## 2022-11-22 NOTE — Assessment & Plan Note (Signed)
Controlled.  Continue omeprazole 40 mg daily which was initiated during her hospital stay.

## 2022-11-22 NOTE — Assessment & Plan Note (Signed)
Asymptomatic.  Continue metoprolol tartrate 12.5 mg BID.

## 2022-11-22 NOTE — Assessment & Plan Note (Signed)
Deteriorated and not a surgical candidate.  Recommended she follow up with orthopedics for injections. Continue Tylenol 650 mg BID and Tramadol 50 mg BID.

## 2022-11-22 NOTE — Progress Notes (Signed)
Subjective:    Patient ID: Kathleen Paul, female    DOB: 06-27-1936, 87 y.o.   MRN: [id number]  HPI  Kathleen Paul is a very pleasant 87 y.o. female with a history of hypertension, CAD, hypothyroidism, chronic pain, osteoarthritis, cellulitis, sepsis who presents today for hospital follow-up. Her daughter joins [place].  Patient was originally evaluated virtually by me on 10/13/2022 for a 4-day history of cough and chest congestion.  Prior to the visit she was advised to come into the office for better evaluation, she refused.  She was treated with oral prednisone and advised to follow-up in person if symptoms persisted.  She phoned back into our office on 10/17/2022, had scheduled another virtual visit for ongoing symptoms.  She refused to come into the office.  She was prescribed azithromycin course with return precautions.  She presented to [facility] ED on 10/20/2022 for continued symptoms, generalized weakness.  Workup in the ED was consistent for left lower lobe pneumonia so she was admitted for sepsis/pneumonia and further treatment and evaluation.  During her hospital stay she received ceftriaxone for left lower extremity cellulitis, doxycycline for community-acquired pneumonia, IV fluids.  She was treated with IV Dilaudid for right inguinal pain.  She completed CT abdomen/pelvis which did not show an obvious cause for her right inguinal pain symptoms.  She completed PT/OT for chronic bilateral severe shoulder osteoarthritis.  Blood cultures grew haemophilus influenza, so she was transitioned over to oral Augmentin.  She also experienced hyponatremia, labs were negative for SIADH.  She was discharged to [facility] on 10/26/2022 for rehab and further monitoring.  During her stay at [facility] she underwent physical therapy.  She was seen for an acute visit for lower extremity edema and hypertension with multiple readings greater than 150/90.  Hydrochlorothiazide for 25 mg was added.   She  was discharged from [facility] on 11/17/22. She has been visited by physical therapy who "is pleased with my recovery". She is overall feeling better. She is dressing, toileting, and feeding herself. She does require assistance with bathing.   She continues to struggle with severe osteoarthritis to her shoulders. She is not a surgical candidate. She is due for another steroid injection, plans on scheduling. She is taking 650 mg of Tylenol BID and Tramadol 50 mg BID which helps her to get through PT and to function. She is requesting a home health nurse.   Her left leg cellulitis has resolved. She denies fevers, cough, shortness of breath.  BP Readings from Last 3 Encounters:  11/22/22 126/84  11/06/22 125/76  10/27/22 128/66      Review of Systems  Constitutional:  Negative for chills and fever.  Respiratory:  Negative for cough and shortness of breath.   Cardiovascular:  Negative for chest pain.  Gastrointestinal:  Negative for constipation and diarrhea.  Musculoskeletal:  Positive for arthralgias.  Neurological:  Negative for headaches.  Psychiatric/Behavioral:  The patient is not nervous/anxious.          Past Medical History:  Diagnosis Date   Anxiety state, unspecified    Arthritis    Atherosclerosis    Cancer (HCC)    skin cancer   Cellulitis 10/20/2022   Diverticulosis    Esophageal stricture    Essential hypertension, benign    Expressive aphasia 06/12/2022   Gastric ulcer    Hiatal hernia    History of DVT (deep vein thrombosis) yrs ago   legs   IDA (iron deficiency  anemia)    Inguinal hernia without mention of obstruction or gangrene, unilateral or unspecified, (not specified as recurrent)    2014   Internal hemorrhoids    Mild mitral regurgitation    a. 04/2010 Echo: nl EF, mild diast dysfxn, trace PR, mild TR, mild-mod MR.   Non-obstructive CAD    a. 04/2010 Myoview: EF 77%, fixed apical/inferior defect;  b. 04/2010 Cath: LAD 52m, LCX/RCA minor irregs, EF  60%;  c. 04/2011 Myoview: low risk.   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   Suprapubic mass 08/20/2012   UI (urinary incontinence)    Umbilical hernia without mention of obstruction or gangrene    2014   Unspecified hypothyroidism    Vitamin D deficiency     Social History   Socioeconomic History   Marital status: Widowed    Spouse name: Not on file   Number of children: Not on file   Years of education: Not on file   Highest education level: Not on file  Occupational History   Not on file  Tobacco Use   Smoking status: Former    Packs/day: 0.50    Years: 30.00    Additional pack years: 0.00    Total pack years: 15.00    Types: Cigarettes    Quit date: 07/10/1988    Years since quitting: 34.3   Smokeless tobacco: Never  Vaping Use   Vaping Use: Never used  Substance and Sexual Activity   Alcohol use: No   Drug use: Never   Sexual activity: Not on file  Other Topics Concern   Not on file  Social History Narrative   Not on file   Social Determinants of Health   Financial Resource Strain: Low Risk  (12/14/2021)   Overall Financial Resource Strain (CARDIA)    Difficulty of Paying Living Expenses: Not hard at all  Food Insecurity: No Food Insecurity (10/20/2022)   Hunger Vital Sign    Worried About Running Out of Food in the Last Year: Never true    Ran Out of Food in the Last Year: Never true  Transportation Needs: No Transportation Needs (10/20/2022)   PRAPARE - Administrator, Civil Service (Medical): No    Lack of Transportation (Non-Medical): No  Physical Activity: Sufficiently Active (12/14/2021)   Exercise Vital Sign    Days of Exercise per Week: 5 days    Minutes of Exercise per Session: 30 min  Stress: No Stress Concern Present (12/14/2021)   [employer] of Occupational  Health - Occupational Stress Questionnaire    Feeling of Stress : Not at all  Social Connections: Moderately Isolated (12/14/2021)   Social Connection and Isolation Panel [NHANES]    Frequency of Communication with Friends and Family: More than three times a week    Frequency of Social Gatherings with Friends and Family: More than three times a week    Attends Religious Services: 1 to 4 times per year    Active Member of [employer] or Organizations: No    Attends Banker Meetings: Never    Marital Status: Widowed  Intimate Partner Violence: Not At Risk (10/20/2022)   Humiliation, Afraid, Rape, and Kick questionnaire    Fear of Current or Ex-Partner: No    Emotionally Abused: No    Physically Abused: No  Sexually Abused: No    Past Surgical History:  Procedure Laterality Date   ABDOMINAL HYSTERECTOMY  2008   complete    BIOPSY  04/02/2018   Procedure: BIOPSY;  Surgeon: Meridee Score Netty Starring., MD;  Location: [facility];  Service: Gastroenterology;;   CARDIAC CATHETERIZATION  2011   [facility]   COLONOSCOPY  2003   [facility]   ESOPHAGOGASTRODUODENOSCOPY (EGD) WITH PROPOFOL N/A 04/02/2018   Procedure: ESOPHAGOGASTRODUODENOSCOPY (EGD) WITH PROPOFOL;  Surgeon: Lemar Lofty., MD;  Location: [facility];  Service: Gastroenterology;  Laterality: N/A;   FOREIGN BODY REMOVAL  04/02/2018   Procedure: FOREIGN BODY REMOVAL;  Surgeon: Meridee Score Netty Starring., MD;  Location: [facility];  Service: Gastroenterology;;   HERNIA REPAIR Right 09/23/2012   repair RIH   LAPAROSCOPIC HYSTERECTOMY     PATELLAR TENDON REPAIR Left 06/03/2019   Procedure: LEFT PATELLA TENDON REPAIR, RIGHT KNEE INJECTION.;  Surgeon: Durene Romans, MD;  Location: [facility];  Service: Orthopedics;  Laterality: Left;   PATELLAR TENDON REPAIR Left 07/01/2019   Procedure: OPEN PATELLA TENDON REPAIR;  Surgeon: Durene Romans, MD;  Location: [facility];  Service: Orthopedics;  Laterality: Left;   TONSILLECTOMY      TOTAL KNEE ARTHROPLASTY Left 02/11/2019   Procedure: TOTAL KNEE ARTHROPLASTY;  Surgeon: Durene Romans, MD;  Location: [facility];  Service: Orthopedics;  Laterality: Left;  70 mins    Family History  Problem Relation Age of Onset   Heart disease Mother    Heart attack Mother    Heart disease Father    Heart attack Father    Breast cancer Sister    Breast cancer Maternal Aunt    Heart disease Brother        sister    Allergies  Allergen Reactions   Codeine Nausea Only    Current Outpatient Medications on File Prior to Visit  Medication Sig Dispense Refill   acetaminophen (TYLENOL) 325 MG tablet Take 650 mg by mouth every 6 (six) hours as needed.     alum & mag hydroxide-simeth (MAALOX PLUS) 400-400-40 MG/5ML suspension Take 5 mLs by mouth every 4 (four) hours as needed for indigestion.     aspirin EC 81 MG tablet Take 1 tablet (81 mg total) by mouth daily. Swallow whole. Aspirin & plavix for 21 days, f/by aspirin alone 30 tablet 0   bisacodyl (DULCOLAX) 10 MG suppository Place 10 mg rectally as needed for moderate constipation.     Calcium-Magnesium-Vitamin D (CALCIUM 1200+D3 PO) Take 1 tablet by mouth daily.     enalapril (VASOTEC) 10 MG tablet Take 1 tablet (10 mg total) by mouth daily. for blood pressure. 90 tablet 1   hydrochlorothiazide (HYDRODIURIL) 25 MG tablet Take 25 mg by mouth daily.     levothyroxine (SYNTHROID) 88 MCG tablet Take 1 tablet by mouth every morning on an empty stomach with water only.  No food or other medications for 30 minutes. 90 tablet 2   metoprolol tartrate (LOPRESSOR) 25 MG tablet Take 12.5 mg by mouth 2 (two) times daily.     Multiple Vitamin (MULTIVITAMIN PO) Take 1 tablet by mouth daily.     polyethylene glycol (MIRALAX / GLYCOLAX) 17 g packet Take 17 g by mouth daily.     simvastatin (ZOCOR) 20 MG tablet Take 1 tablet (20 mg total) by mouth every evening. for cholesterol. 90 tablet 2   traMADol (ULTRAM) 50 MG tablet Take 1 tablet (50 mg total) by mouth  every 6 (six) hours as needed. 30 tablet 0  lidocaine (LIDODERM) 5 % Apply to both shoulders, Both knees, right hip topically in the morning for pain. Remove & Discard patch within 12 hours or as directed by MD (Patient not taking: Reported on 11/22/2022)     Zinc Oxide (TRIPLE PASTE) 12.8 % ointment Apply 1 Application topically as needed for irritation. (Patient not taking: Reported on 11/22/2022)     No current facility-administered medications on file prior to visit.    BP 126/84 Comment: Patient refused to lift sleeve  Pulse 76   Temp 98.4 F (36.9 C) (Temporal)   Ht 4\' 11"  (1.499 m)   Wt 154 lb (69.9 kg) Comment: Pt reported  SpO2 96%   BMI 31.10 kg/m  Objective:   Physical Exam Cardiovascular:     Rate and Rhythm: Normal rate and regular rhythm.  Pulmonary:     Effort: Pulmonary effort is normal.     Breath sounds: Normal breath sounds.  Musculoskeletal:     Cervical back: Neck supple.  Skin:    General: Skin is warm and dry.  Neurological:     Mental Status: She is alert and oriented to person, place, and time.  Psychiatric:        Mood and Affect: Mood normal.           Assessment & Plan:  Community acquired pneumonia of left lower lobe of lung Assessment & Plan: Recent hospitalization. Hospital notes, labs, imaging reviewed.   Exam today with clear lungs, she appears well. Referral placed for home health nursing.  Continue home health PT/OT.  Orders: -     Ambulatory referral to Home Health  Coronary artery disease involving native coronary artery of native heart without angina pectoris Assessment & Plan: Asymptomatic.  Continue metoprolol tartrate 12.5 mg BID.    Primary hypertension Assessment & Plan: Controlled.  Continue enalapril-HCTZ 10-25 mg daily for now. BMP pending.  Orders: -     Basic metabolic panel -     CBC  Primary osteoarthritis of both knees Assessment & Plan: Ongoing, but stable.  Continue Tylenol 650 mg BID and  Tramadol 50 mg BID.   Hypothyroidism, unspecified type Assessment & Plan: She understands how to take levothyroxine correctly, had been given it incorrectly at SNF.  Continue levothyroxine 88 mcg daily. TSH ordered and pending.  Orders: -     TSH  Dyslipidemia Assessment & Plan: Repeat lipid panel pending. Continue simvastatin 20 mg daily.  Orders: -     Lipid panel  Gastroesophageal reflux disease with esophagitis, unspecified whether hemorrhage Assessment & Plan: Controlled.  Continue omeprazole 40 mg daily which was initiated during her hospital stay.   Orders: -     Omeprazole; Take 1 capsule (40 mg total) by mouth daily. for heartburn.  Dispense: 90 capsule; Refill: 3  Stricture and stenosis of esophagus Assessment & Plan: Continue omeprazole 40 mg daily.   Osteoarthritis of both shoulders, unspecified osteoarthritis type Assessment & Plan: Deteriorated and not a surgical candidate.  Recommended she follow up with orthopedics for injections. Continue Tylenol 650 mg BID and Tramadol 50 mg BID.     Chronic kidney disease, stage 3a (HCC) Assessment & Plan: Repeat renal function pending.   GAD (generalized anxiety disorder) Assessment & Plan: No concerns today. Continue to monitor.   Hyponatremia Assessment & Plan: During recent hospital stay. Repeat BMP pending.  Consider discontinuation of HCTZ if no improvement.    Palpitations Assessment & Plan: Controlled.  Continue metoprolol tartrate 12.5 mg BID   Urge  incontinence of urine Assessment & Plan: Stable.  No concerns today. Remain off oxybutynin.         Doreene Nest, NP

## 2022-11-22 NOTE — Assessment & Plan Note (Signed)
During recent hospital stay. Repeat BMP pending.  Consider discontinuation of HCTZ if no improvement.

## 2022-11-22 NOTE — Assessment & Plan Note (Signed)
Recent hospitalization. Hospital notes, labs, imaging reviewed.   Exam today with clear lungs, she appears well. Referral placed for home health nursing.  Continue home health PT/OT.

## 2022-11-22 NOTE — Assessment & Plan Note (Signed)
She understands how to take levothyroxine correctly, had been given it incorrectly at [facility].  Continue levothyroxine 88 mcg daily. TSH ordered and pending.

## 2022-11-22 NOTE — Assessment & Plan Note (Signed)
Ongoing, but stable.  Continue Tylenol 650 mg BID and Tramadol 50 mg BID.

## 2022-11-22 NOTE — Assessment & Plan Note (Signed)
Continue omeprazole 40 mg daily

## 2022-11-22 NOTE — Assessment & Plan Note (Signed)
Repeat renal function pending. 

## 2022-11-22 NOTE — Assessment & Plan Note (Signed)
No concerns today. Continue to monitor. 

## 2022-11-23 ENCOUNTER — Other Ambulatory Visit: Payer: Self-pay | Admitting: Primary Care

## 2022-11-23 DIAGNOSIS — D649 Anemia, unspecified: Secondary | ICD-10-CM

## 2022-11-23 DIAGNOSIS — I10 Essential (primary) hypertension: Secondary | ICD-10-CM

## 2022-11-23 LAB — CBC
HCT: 24.8 % — ABNORMAL LOW (ref 36.0–46.0)
Hemoglobin: 8.2 g/dL — ABNORMAL LOW (ref 12.0–15.0)
MCHC: 33.3 g/dL (ref 30.0–36.0)
MCV: 91.7 fl (ref 78.0–100.0)
Platelets: 362 10*3/uL (ref 150.0–400.0)
RBC: 2.7 Mil/uL — ABNORMAL LOW (ref 3.87–5.11)
RDW: 17.5 % — ABNORMAL HIGH (ref 11.5–15.5)
WBC: 6.7 10*3/uL (ref 4.0–10.5)

## 2022-11-23 LAB — BASIC METABOLIC PANEL
BUN: 59 mg/dL — ABNORMAL HIGH (ref 6–23)
CO2: 23 mEq/L (ref 19–32)
Calcium: 8.9 mg/dL (ref 8.4–10.5)
Chloride: 96 mEq/L (ref 96–112)
Creatinine, Ser: 0.99 mg/dL (ref 0.40–1.20)
GFR: 52.17 mL/min — ABNORMAL LOW (ref >60.00)
Glucose, Bld: 100 mg/dL — ABNORMAL HIGH (ref 70–99)
Potassium: 4.7 mEq/L (ref 3.5–5.1)
Sodium: 133 mEq/L — ABNORMAL LOW (ref 135–145)

## 2022-11-23 LAB — LIPID PANEL
Cholesterol: 153 mg/dL (ref 0–200)
HDL: 55 mg/dL (ref >39.00)
LDL Cholesterol: 70 mg/dL (ref 0–99)
NonHDL: 97.88
Total CHOL/HDL Ratio: 3
Triglycerides: 138 mg/dL (ref 0.0–149.0)
VLDL: 27.6 mg/dL (ref 0.0–40.0)

## 2022-11-23 LAB — TSH: TSH: 12.85 u[IU]/mL — ABNORMAL HIGH (ref 0.35–5.50)

## 2022-11-23 MED ORDER — ENALAPRIL MALEATE 20 MG PO TABS
20.0000 mg | ORAL_TABLET | Freq: Every day | ORAL | 3 refills | Status: DC
Start: 2022-11-23 — End: 2023-11-06

## 2022-11-24 ENCOUNTER — Telehealth: Payer: Self-pay | Admitting: Primary Care

## 2022-11-24 NOTE — Telephone Encounter (Signed)
Noted  

## 2022-11-24 NOTE — Telephone Encounter (Signed)
[name] from [employer] called over and stated that she did evaluation on [place] but patient doesn't need OT services at the moment. Thank you!

## 2022-11-27 ENCOUNTER — Other Ambulatory Visit (INDEPENDENT_AMBULATORY_CARE_PROVIDER_SITE_OTHER): Payer: PPO | Admitting: *Deleted

## 2022-11-27 ENCOUNTER — Other Ambulatory Visit: Payer: Self-pay | Admitting: Primary Care

## 2022-11-27 DIAGNOSIS — D649 Anemia, unspecified: Secondary | ICD-10-CM

## 2022-11-27 DIAGNOSIS — I10 Essential (primary) hypertension: Secondary | ICD-10-CM

## 2022-11-29 LAB — FECAL OCCULT BLOOD, IMMUNOCHEMICAL: Fecal Occult Bld: NEGATIVE

## 2022-12-07 ENCOUNTER — Other Ambulatory Visit (INDEPENDENT_AMBULATORY_CARE_PROVIDER_SITE_OTHER): Payer: PPO

## 2022-12-07 ENCOUNTER — Other Ambulatory Visit: Payer: Self-pay | Admitting: Primary Care

## 2022-12-07 DIAGNOSIS — D649 Anemia, unspecified: Secondary | ICD-10-CM

## 2022-12-07 DIAGNOSIS — I10 Essential (primary) hypertension: Secondary | ICD-10-CM | POA: Diagnosis not present

## 2022-12-07 LAB — CBC
HCT: 30.4 % — ABNORMAL LOW (ref 36.0–46.0)
Hemoglobin: 9.7 g/dL — ABNORMAL LOW (ref 12.0–15.0)
MCHC: 32 g/dL (ref 30.0–36.0)
MCV: 95.9 fl (ref 78.0–100.0)
Platelets: 278 10*3/uL (ref 150.0–400.0)
RBC: 3.17 Mil/uL — ABNORMAL LOW (ref 3.87–5.11)
RDW: 20.1 % — ABNORMAL HIGH (ref 11.5–15.5)
WBC: 5.9 10*3/uL (ref 4.0–10.5)

## 2022-12-07 LAB — BASIC METABOLIC PANEL
BUN: 35 mg/dL — ABNORMAL HIGH (ref 6–23)
CO2: 25 mEq/L (ref 19–32)
Calcium: 9.6 mg/dL (ref 8.4–10.5)
Chloride: 98 mEq/L (ref 96–112)
Creatinine, Ser: 0.98 mg/dL (ref 0.40–1.20)
GFR: 52.8 mL/min — ABNORMAL LOW (ref >60.00)
Glucose, Bld: 89 mg/dL (ref 70–99)
Potassium: 4.5 mEq/L (ref 3.5–5.1)
Sodium: 136 mEq/L (ref 135–145)

## 2022-12-08 DIAGNOSIS — M12811 Other specific arthropathies, not elsewhere classified, right shoulder: Secondary | ICD-10-CM | POA: Diagnosis not present

## 2022-12-08 DIAGNOSIS — M12812 Other specific arthropathies, not elsewhere classified, left shoulder: Secondary | ICD-10-CM | POA: Diagnosis not present

## 2022-12-18 ENCOUNTER — Ambulatory Visit (INDEPENDENT_AMBULATORY_CARE_PROVIDER_SITE_OTHER): Payer: PPO

## 2022-12-18 VITALS — Ht <= 58 in | Wt 150.0 lb

## 2022-12-18 DIAGNOSIS — Z0000 Encounter for general adult medical examination without abnormal findings: Secondary | ICD-10-CM | POA: Diagnosis not present

## 2022-12-18 NOTE — Progress Notes (Signed)
I connected with  Toma Aran on 12/18/22 by a audio enabled telemedicine application and verified that I am speaking with the correct person using two identifiers.  Patient Location: Home  Provider Location: Home Office  I discussed the limitations of evaluation and management by telemedicine. The patient expressed understanding and agreed to proceed.  Subjective:   AUDREYANA HUNTSBERRY is a 87 y.o. female who presents for Medicare Annual (Subsequent) preventive examination.  Review of Systems      Cardiac Risk Factors include: advanced age (>55men, >6 women);hypertension;sedentary lifestyle;dyslipidemia     Objective:    Today's Vitals   12/18/22 1321 12/18/22 1322  Weight: 150 lb (68 kg)   Height: 4\' 10"  (1.473 m)   PainSc:  8    Body mass index is 31.35 kg/m.     12/18/2022    1:34 PM 11/06/2022    1:40 PM 10/27/2022    8:47 AM 10/20/2022    1:00 PM 06/11/2022   11:16 AM 12/14/2021    1:18 PM 12/13/2020    1:24 PM  Advanced Directives  Does Patient Have a Medical Advance Directive? Yes Yes Yes Yes Yes Yes Yes  Type of Estate agent of [place];Living will Healthcare Power of [place];Out of facility DNR (pink MOST or yellow form) Healthcare Power of [place];Out of facility DNR (pink MOST or yellow form) Healthcare Power of [employer] of [place];Living will Healthcare Power of [place];Living will  Does patient want to make changes to medical advance directive? No - Patient declined No - Patient declined No - Patient declined No - Patient declined     Copy of Healthcare Power of Attorney in Chart? Yes - validated most recent copy scanned in chart (See row information) Yes - validated most recent copy scanned in chart (See row information) Yes - validated most recent copy scanned in chart (See row information) No - copy requested  Yes - validated most recent copy scanned in chart (See row information) Yes - validated most recent copy scanned in  chart (See row information)    Current Medications (verified) Outpatient Encounter Medications as of 12/18/2022  Medication Sig   acetaminophen (TYLENOL) 325 MG tablet Take 650 mg by mouth every 6 (six) hours as needed.   alum & mag hydroxide-simeth (MAALOX PLUS) 400-400-40 MG/5ML suspension Take 5 mLs by mouth every 4 (four) hours as needed for indigestion.   aspirin EC 81 MG tablet Take 1 tablet (81 mg total) by mouth daily. Swallow whole. Aspirin & plavix for 21 days, f/by aspirin alone   bisacodyl (DULCOLAX) 10 MG suppository Place 10 mg rectally as needed for moderate constipation.   Calcium-Magnesium-Vitamin D (CALCIUM 1200+D3 PO) Take 1 tablet by mouth daily.   enalapril (VASOTEC) 20 MG tablet Take 1 tablet (20 mg total) by mouth daily. for blood pressure.   levothyroxine (SYNTHROID) 88 MCG tablet Take 1 tablet by mouth every morning on an empty stomach with water only.  No food or other medications for 30 minutes.   metoprolol tartrate (LOPRESSOR) 25 MG tablet Take 12.5 mg by mouth 2 (two) times daily.   Multiple Vitamin (MULTIVITAMIN PO) Take 1 tablet by mouth daily.   omeprazole (PRILOSEC) 40 MG capsule Take 1 capsule (40 mg total) by mouth daily. for heartburn.   polyethylene glycol (MIRALAX / GLYCOLAX) 17 g packet Take 17 g by mouth daily.   simvastatin (ZOCOR) 20 MG tablet Take 1 tablet (20 mg total) by mouth every evening. for cholesterol.  traMADol (ULTRAM) 50 MG tablet Take 1 tablet (50 mg total) by mouth every 6 (six) hours as needed.   Zinc Oxide (TRIPLE PASTE) 12.8 % ointment Apply 1 Application topically as needed for irritation.   lidocaine (LIDODERM) 5 % Apply to both shoulders, Both knees, right hip topically in the morning for pain. Remove & Discard patch within 12 hours or as directed by MD (Patient not taking: Reported on 11/22/2022)   No facility-administered encounter medications on file as of 12/18/2022.    Allergies (verified) Codeine   History: Past Medical  History:  Diagnosis Date   Anxiety state, unspecified    Arthritis    Atherosclerosis    Cancer (HCC)    skin cancer   Cellulitis 10/20/2022   Diverticulosis    Esophageal stricture    Essential hypertension, benign    Expressive aphasia 06/12/2022   Gastric ulcer    Hiatal hernia    History of DVT (deep vein thrombosis) yrs ago   legs   IDA (iron deficiency anemia)    Inguinal hernia without mention of obstruction or gangrene, unilateral or unspecified, (not specified as recurrent)    2014   Internal hemorrhoids    Mild mitral regurgitation    a. 04/2010 Echo: nl EF, mild diast dysfxn, trace PR, mild TR, mild-mod MR.   Non-obstructive CAD    a. 04/2010 Myoview: EF 77%, fixed apical/inferior defect;  b. 04/2010 Cath: LAD 71m, LCX/RCA minor irregs, EF 60%;  c. 04/2011 Myoview: low risk.   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   Suprapubic mass 08/20/2012   UI (urinary incontinence)    Umbilical hernia without mention of obstruction or gangrene    2014   Unspecified hypothyroidism    Vitamin D deficiency    Past Surgical History:  Procedure Laterality Date   ABDOMINAL HYSTERECTOMY  2008   complete    BIOPSY  04/02/2018   Procedure: BIOPSY;  Surgeon: Lemar Lofty., MD;  Location: [facility];  Service: Gastroenterology;;   CARDIAC CATHETERIZATION  2011   [facility]   COLONOSCOPY  2003   [facility]   ESOPHAGOGASTRODUODENOSCOPY (EGD) WITH PROPOFOL N/A 04/02/2018   Procedure: ESOPHAGOGASTRODUODENOSCOPY (EGD) WITH PROPOFOL;  Surgeon: Lemar Lofty., MD;  Location: [facility];  Service: Gastroenterology;  Laterality: N/A;   FOREIGN BODY REMOVAL  04/02/2018   Procedure: FOREIGN BODY REMOVAL;  Surgeon: Meridee Score Netty Starring., MD;  Location: [facility];  Service: Gastroenterology;;    HERNIA REPAIR Right 09/23/2012   repair RIH   LAPAROSCOPIC HYSTERECTOMY     PATELLAR TENDON REPAIR Left 06/03/2019   Procedure: LEFT PATELLA TENDON REPAIR, RIGHT KNEE INJECTION.;  Surgeon: Durene Romans, MD;  Location: [facility];  Service: Orthopedics;  Laterality: Left;   PATELLAR TENDON REPAIR Left 07/01/2019   Procedure: OPEN PATELLA TENDON REPAIR;  Surgeon: Durene Romans, MD;  Location: [facility];  Service: Orthopedics;  Laterality: Left;   TONSILLECTOMY     TOTAL KNEE ARTHROPLASTY Left 02/11/2019   Procedure: TOTAL KNEE ARTHROPLASTY;  Surgeon: Durene Romans, MD;  Location: [facility];  Service: Orthopedics;  Laterality: Left;  70 mins   Family History  Problem Relation Age of Onset   Heart disease Mother    Heart attack Mother    Heart disease Father    Heart attack Father  Breast cancer Sister    Breast cancer Maternal Aunt    Heart disease Brother        sister   Social History   Socioeconomic History   Marital status: Widowed    Spouse name: Not on file   Number of children: Not on file   Years of education: Not on file   Highest education level: Not on file  Occupational History   Not on file  Tobacco Use   Smoking status: Former    Packs/day: 0.50    Years: 30.00    Additional pack years: 0.00    Total pack years: 15.00    Types: Cigarettes    Quit date: 07/10/1988    Years since quitting: 34.4   Smokeless tobacco: Never  Vaping Use   Vaping Use: Never used  Substance and Sexual Activity   Alcohol use: No   Drug use: Never   Sexual activity: Not on file  Other Topics Concern   Not on file  Social History Narrative   Not on file   Social Determinants of Health   Financial Resource Strain: Low Risk  (12/18/2022)   Overall Financial Resource Strain (CARDIA)    Difficulty of Paying Living Expenses: Not hard at all  Food Insecurity: No Food Insecurity (12/18/2022)   Hunger Vital Sign    Worried About Running Out of Food in the Last Year: Never true    Ran Out of  Food in the Last Year: Never true  Transportation Needs: No Transportation Needs (12/18/2022)   PRAPARE - Administrator, Civil Service (Medical): No    Lack of Transportation (Non-Medical): No  Physical Activity: Inactive (12/18/2022)   Exercise Vital Sign    Days of Exercise per Week: 0 days    Minutes of Exercise per Session: 0 min  Stress: No Stress Concern Present (12/18/2022)   [employer] of Occupational Health - Occupational Stress Questionnaire    Feeling of Stress : Not at all  Social Connections: Socially Isolated (12/18/2022)   Social Connection and Isolation Panel [NHANES]    Frequency of Communication with Friends and Family: More than three times a week    Frequency of Social Gatherings with Friends and Family: More than three times a week    Attends Religious Services: Never    Database administrator or Organizations: No    Attends Banker Meetings: Never    Marital Status: Widowed    Tobacco Counseling Counseling given: Not Answered   Clinical Intake:  Pre-visit preparation completed: Yes  Pain : 0-10 Pain Score: 8  Pain Type: Acute pain Pain Location: Generalized Pain Descriptors / Indicators: Throbbing Pain Onset: More than a month ago     Nutritional Risks: None Diabetes: No  How often do you need to have someone help you when you read instructions, pamphlets, or other written materials from your doctor or pharmacy?: 1 - Never  Diabetic? no  Interpreter Needed?: No  Information entered by :: C.Aunika Kirsten LPN   Activities of Daily Living    12/18/2022    1:36 PM 10/20/2022    1:00 PM  In your present state of health, do you have any difficulty performing the following activities:  Hearing? 0 0  Vision? 0 0  Difficulty concentrating or making decisions? 0 0  Walking or climbing stairs? 1 1  Comment walker   Dressing or bathing? 0 1  Doing errands, shopping? 1 1  Comment daughter assists   Preparing  Food and  eating ? N   Using the Toilet? N   In the past six months, have you accidently leaked urine? Y   Comment wears pad   Do you have problems with loss of bowel control? N   Managing your Medications? N   Managing your Finances? N   Housekeeping or managing your Housekeeping? N     Patient Care Team: Doreene Nest, NP as PCP - General (Internal Medicine) Pricilla Riffle, MD as PCP - Cardiology (Cardiology) Kathyrn Sheriff, [facility] as Pharmacist (Pharmacist)  Indicate any recent Medical Services you may have received from other than [facility] providers in the past year (date may be approximate).     Assessment:   This is a routine wellness examination for Philisha.  Hearing/Vision screen Hearing Screening - Comments:: No hearing issues Vision Screening - Comments:: Readers - [facility]  Dietary issues and exercise activities discussed: Current Exercise Habits: The patient does not participate in regular exercise at present, Exercise limited by: None identified   Goals Addressed             This Visit's Progress    Patient Stated       No new goals       Depression Screen    12/18/2022    1:33 PM 12/14/2021    1:15 PM 12/13/2021    8:57 AM 12/13/2020    1:28 PM 11/05/2019    3:35 PM  PHQ 2/9 Scores  PHQ - 2 Score 0 0 0 0 0  PHQ- 9 Score  0 0 0 0    Fall Risk    12/18/2022    1:36 PM 11/22/2022    2:14 PM 10/13/2022   12:08 PM 12/14/2021    1:18 PM 12/13/2021    8:58 AM  Fall Risk   Falls in the past year? 0 0 0 0 0  Number falls in past yr: 0 0 0 0 0  Injury with Fall? 0 0 0 0 0  Risk for fall due to : Impaired balance/gait No Fall Risks No Fall Risks No Fall Risks Impaired balance/gait;Impaired mobility  Risk for fall due to: Comment uses walker      Follow up Falls prevention discussed;Falls evaluation completed Falls evaluation completed Falls evaluation completed Falls prevention discussed     FALL RISK PREVENTION PERTAINING TO THE HOME:  Any stairs in or around the  home? No  If so, are there any without handrails? No  Home free of loose throw rugs in walkways, pet beds, electrical cords, etc? Yes  Adequate lighting in your home to reduce risk of falls? Yes   ASSISTIVE DEVICES UTILIZED TO PREVENT FALLS:  Life alert? Yes  Use of a cane, walker or w/c? Yes  Grab bars in the bathroom? Yes  Shower chair or bench in shower? Yes  Elevated toilet seat or a handicapped toilet? Yes   Cognitive Function:    12/13/2020    1:33 PM 11/05/2019    3:36 PM  MMSE - Mini Mental State Exam  Orientation to time 5 5  Orientation to Place 5 5  Registration 3 3  Attention/ Calculation 5 5  Recall 3 3  Language- repeat 1 1        12/18/2022    1:37 PM 12/14/2021    1:24 PM  6CIT Screen  What Year? 0 points 0 points  What month? 3 points 0 points  What time? 0 points 0 points  Count back  from 20 0 points 0 points  Months in reverse 0 points 0 points  Repeat phrase 2 points 0 points  Total Score 5 points 0 points    Immunizations Immunization History  Administered Date(s) Administered   Influenza Split 04/02/2012   Influenza, High Dose Seasonal PF 04/16/2017   Influenza,inj,Quad PF,6+ Mos 04/29/2013, 04/18/2018, 04/29/2019, 04/19/2020   Influenza-Unspecified 04/30/2016, 04/13/2022   PFIZER(Purple Top)SARS-COV-2 Vaccination 09/24/2019, 10/15/2019   Pneumococcal Conjugate-13 04/29/2019   Pneumococcal Polysaccharide-23 04/18/2018   RSV,unspecified 04/13/2022    TDAP status: Due, Education has been provided regarding the importance of this vaccine. Advised may receive this vaccine at local pharmacy or Health Dept. Aware to provide a copy of the vaccination record if obtained from local pharmacy or Health Dept. Verbalized acceptance and understanding.  Flu Vaccine status: Up to date  Pneumococcal vaccine status: Up to date  Covid-19 vaccine status: Information provided on how to obtain vaccines.   Qualifies for Shingles Vaccine? Yes   Zostavax  completed No   Shingrix Completed?: No.    Education has been provided regarding the importance of this vaccine. Patient has been advised to call insurance company to determine out of pocket expense if they have not yet received this vaccine. Advised may also receive vaccine at local pharmacy or Health Dept. Verbalized acceptance and understanding.  Screening Tests Health Maintenance  Topic Date Due   DTaP/Tdap/Td (1 - Tdap) Never done   Zoster Vaccines- Shingrix (1 of 2) Never done   INFLUENZA VACCINE  02/08/2023   Medicare Annual Wellness (AWV)  12/18/2023   Pneumonia Vaccine 46+ Years old  Completed   HPV VACCINES  Aged Out   DEXA SCAN  Discontinued   COVID-19 Vaccine  Discontinued    Health Maintenance  Health Maintenance Due  Topic Date Due   DTaP/Tdap/Td (1 - Tdap) Never done   Zoster Vaccines- Shingrix (1 of 2) Never done    Colorectal cancer screening: No longer required.   Mammogram status: No longer required due to age.  Bone Scan - Patient declined.  Lung Cancer Screening: (Low Dose CT Chest recommended if Age 45-80 years, 30 pack-year currently smoking OR have quit w/in 15years.) does not qualify.   Lung Cancer Screening Referral: no  Additional Screening:  Hepatitis C Screening: does not qualify; Completed no  Vision Screening: Recommended annual ophthalmology exams for early detection of glaucoma and other disorders of the eye. Is the patient up to date with their annual eye exam?  Yes  Who is the provider or what is the name of the office in which the patient attends annual eye exams? [facility] If pt is not established with a provider, would they like to be referred to a provider to establish care? Yes .   Dental Screening: Recommended annual dental exams for proper oral hygiene  Community Resource Referral / Chronic Care Management: CRR required this visit?  No   CCM required this visit?  No      Plan:     I have personally reviewed and noted  the following in the patient's chart:   Medical and social history Use of alcohol, tobacco or illicit drugs  Current medications and supplements including opioid prescriptions. Patient is currently taking opioid prescriptions. Information provided to patient regarding non-opioid alternatives. Patient advised to discuss non-opioid treatment plan with their provider. Functional ability and status Nutritional status Physical activity Advanced directives List of other physicians Hospitalizations, surgeries, and ER visits in previous 12 months Vitals Screenings to include  cognitive, depression, and falls Referrals and appointments  In addition, I have reviewed and discussed with patient certain preventive protocols, quality metrics, and best practice recommendations. A written personalized care plan for preventive services as well as general preventive health recommendations were provided to patient.     Maryan Puls, LPN   [id number]   Nurse Notes: none

## 2022-12-18 NOTE — Patient Instructions (Signed)
Kathleen Paul , Thank you for taking time to come for your Medicare Wellness Visit. I appreciate your ongoing commitment to your health goals. Please review the following plan we discussed and let me know if I can assist you in the future.   These are the goals we discussed:  Goals      Exercise 3x per week (30 min per time)     Stay active, mentally and physically.     Patient Stated     11/05/2019, I will maintain and continue medications as prescribed.      Patient Stated     12/13/2020, I will maintain and continue medications as prescribed.     Patient Stated     No new goals     Prevent Falls and Broken Bones-Osteoporosis     Timeframe:  Long-Range Goal Priority:  High Start Date:         06/01/21                    Expected End Date:     06/01/22                  Follow Up Date May 2023   - always use handrails on the stairs - always wear shoes or slippers with non-slip sole - get at least 10 minutes of activity every day - keep cell phone with me always - make an emergency alert plan in case I fall - pick up clutter from the floors - use a cane or walker - use a nightlight in the bathroom    Why is this important?   When you fall, there are 3 things that control if a bone breaks or not.  These are the fall itself, how hard and the direction that you fall and how fragile your bones are.  Preventing falls is very important for you because of fragile bones.     Notes:         This is a list of the screening recommended for you and due dates:  Health Maintenance  Topic Date Due   DTaP/Tdap/Td vaccine (1 - Tdap) Never done   Zoster (Shingles) Vaccine (1 of 2) Never done   Flu Shot  02/08/2023   Medicare Annual Wellness Visit  12/18/2023   Pneumonia Vaccine  Completed   HPV Vaccine  Aged Out   DEXA scan (bone density measurement)  Discontinued   COVID-19 Vaccine  Discontinued   Managing Pain Without Opioids Opioids are strong medicines used to treat moderate to  severe pain. For some people, especially those who have long-term (chronic) pain, opioids may not be the best choice for pain management due to: Side effects like nausea, constipation, and sleepiness. The risk of addiction (opioid use disorder). The longer you take opioids, the greater your risk of addiction. Pain that lasts for more than 3 months is called chronic pain. Managing chronic pain usually requires more than one approach and is often provided by a team of health care providers working together (multidisciplinary approach). Pain management may be done at a pain management center or pain clinic. How to manage pain without the use of opioids Use non-opioid medicines Non-opioid medicines for pain may include: Over-the-counter or prescription non-steroidal anti-inflammatory drugs (NSAIDs). These may be the first medicines used for pain. They work well for muscle and bone pain, and they reduce swelling. Acetaminophen. This over-the-counter medicine may work well for milder pain but not swelling. Antidepressants. These may be used  to treat chronic pain. A certain type of antidepressant (tricyclics) is often used. These medicines are given in lower doses for pain than when used for depression. Anticonvulsants. These are usually used to treat seizures but may also reduce nerve (neuropathic) pain. Muscle relaxants. These relieve pain caused by sudden muscle tightening (spasms). You may also use a pain medicine that is applied to the skin as a patch, cream, or gel (topical analgesic), such as a numbing medicine. These may cause fewer side effects than medicines taken by mouth. Do certain therapies as directed Some therapies can help with pain management. They include: Physical therapy. You will do exercises to gain strength and flexibility. A physical therapist may teach you exercises to move and stretch parts of your body that are weak, stiff, or painful. You can learn these exercises at physical  therapy visits and practice them at home. Physical therapy may also involve: Massage. Heat wraps or applying heat or cold to affected areas. Electrical signals that interrupt pain signals (transcutaneous electrical nerve stimulation, TENS). Weak lasers that reduce pain and swelling (low-level laser therapy). Signals from your body that help you learn to regulate pain (biofeedback). Occupational therapy. This helps you to learn ways to function at home and work with less pain. Recreational therapy. This involves trying new activities or hobbies, such as a physical activity or drawing. Mental health therapy, including: Cognitive behavioral therapy (CBT). This helps you learn coping skills for dealing with pain. Acceptance and commitment therapy (ACT) to change the way you think and react to pain. Relaxation therapies, including muscle relaxation exercises and mindfulness-based stress reduction. Pain management counseling. This may be individual, family, or group counseling.  Receive medical treatments Medical treatments for pain management include: Nerve block injections. These may include a pain blocker and anti-inflammatory medicines. You may have injections: Near the spine to relieve chronic back or neck pain. Into joints to relieve back or joint pain. Into nerve areas that supply a painful area to relieve body pain. Into muscles (trigger point injections) to relieve some painful muscle conditions. A medical device placed near your spine to help block pain signals and relieve nerve pain or chronic back pain (spinal cord stimulation device). Acupuncture. Follow these instructions at home Medicines Take over-the-counter and prescription medicines only as told by your health care provider. If you are taking pain medicine, ask your health care providers about possible side effects to watch out for. Do not drive or use heavy machinery while taking prescription opioid pain  medicine. Lifestyle  Do not use drugs or alcohol to reduce pain. If you drink alcohol, limit how much you have to: 0-1 drink a day for women who are not pregnant. 0-2 drinks a day for men. Know how much alcohol is in a drink. In the [place] one drink equals one 12 oz bottle of beer (355 mL), one 5 oz glass of wine (148 mL), or one 1 oz glass of hard liquor (44 mL). Do not use any products that contain nicotine or tobacco. These products include cigarettes, chewing tobacco, and vaping devices, such as e-cigarettes. If you need help quitting, ask your health care provider. Eat a healthy diet and maintain a healthy weight. Poor diet and excess weight may make pain worse. Eat foods that are high in fiber. These include fresh fruits and vegetables, whole grains, and beans. Limit foods that are high in fat and processed sugars, such as fried and sweet foods. Exercise regularly. Exercise lowers stress and may help relieve  pain. Ask your health care provider what activities and exercises are safe for you. If your health care provider approves, join an exercise class that combines movement and stress reduction. Examples include yoga and tai chi. Get enough sleep. Lack of sleep may make pain worse. Lower stress as much as possible. Practice stress reduction techniques as told by your therapist. General instructions Work with all your pain management providers to find the treatments that work best for you. You are an important member of your pain management team. There are many things you can do to reduce pain on your own. Consider joining an online or in-person support group for people who have chronic pain. Keep all follow-up visits. This is important. Where to find more information You can find more information about managing pain without opioids from: American Academy of Pain Medicine: painmed.org Institute for Chronic Pain: instituteforchronicpain.org American Chronic Pain Association:  theacpa.org Contact a health care provider if: You have side effects from pain medicine. Your pain gets worse or does not get better with treatments or home therapy. You are struggling with anxiety or depression. Summary Many types of pain can be managed without opioids. Chronic pain may respond better to pain management without opioids. Pain is best managed when you and a team of health care providers work together. Pain management without opioids may include non-opioid medicines, medical treatments, physical therapy, mental health therapy, and lifestyle changes. Tell your health care providers if your pain gets worse or is not being managed well enough. This information is not intended to replace advice given to you by your health care provider. Make sure you discuss any questions you have with your health care provider. Document Revised: 10/06/2020 Document Reviewed: 10/06/2020 Elsevier Patient Education  2024 Elsevier Inc.   Advanced directives: on file in chart  Conditions/risks identified: Aim for 30 minutes of exercise or brisk walking, 6-8 glasses of water, and 5 servings of fruits and vegetables each day.   Next appointment: Follow up in one year for your annual wellness visit 12/19/23 @ 11:15 telephone visit.   Preventive Care 28 Years and Older, Female Preventive care refers to lifestyle choices and visits with your health care provider that can promote health and wellness. What does preventive care include? A yearly physical exam. This is also called an annual well check. Dental exams once or twice a year. Routine eye exams. Ask your health care provider how often you should have your eyes checked. Personal lifestyle choices, including: Daily care of your teeth and gums. Regular physical activity. Eating a healthy diet. Avoiding tobacco and drug use. Limiting alcohol use. Practicing safe sex. Taking low-dose aspirin every day. Taking vitamin and mineral supplements as  recommended by your health care provider. What happens during an annual well check? The services and screenings done by your health care provider during your annual well check will depend on your age, overall health, lifestyle risk factors, and family history of disease. Counseling  Your health care provider may ask you questions about your: Alcohol use. Tobacco use. Drug use. Emotional well-being. Home and relationship well-being. Sexual activity. Eating habits. History of falls. Memory and ability to understand (cognition). Work and work Astronomer. Reproductive health. Screening  You may have the following tests or measurements: Height, weight, and BMI. Blood pressure. Lipid and cholesterol levels. These may be checked every 5 years, or more frequently if you are over 68 years old. Skin check. Lung cancer screening. You may have this screening every year starting at  age 29 if you have a 30-pack-year history of smoking and currently smoke or have quit within the past 15 years. Fecal occult blood test (FOBT) of the stool. You may have this test every year starting at age 74. Flexible sigmoidoscopy or colonoscopy. You may have a sigmoidoscopy every 5 years or a colonoscopy every 10 years starting at age 9. Hepatitis C blood test. Hepatitis B blood test. Sexually transmitted disease (STD) testing. Diabetes screening. This is done by checking your blood sugar (glucose) after you have not eaten for a while (fasting). You may have this done every 1-3 years. Bone density scan. This is done to screen for osteoporosis. You may have this done starting at age 7. Mammogram. This may be done every 1-2 years. Talk to your health care provider about how often you should have regular mammograms. Talk with your health care provider about your test results, treatment options, and if necessary, the need for more tests. Vaccines  Your health care provider may recommend certain vaccines, such  as: Influenza vaccine. This is recommended every year. Tetanus, diphtheria, and acellular pertussis (Tdap, Td) vaccine. You may need a Td booster every 10 years. Zoster vaccine. You may need this after age 28. Pneumococcal 13-valent conjugate (PCV13) vaccine. One dose is recommended after age 60. Pneumococcal polysaccharide (PPSV23) vaccine. One dose is recommended after age 41. Talk to your health care provider about which screenings and vaccines you need and how often you need them. This information is not intended to replace advice given to you by your health care provider. Make sure you discuss any questions you have with your health care provider. Document Released: 07/23/2015 Document Revised: 03/15/2016 Document Reviewed: 04/27/2015 Elsevier Interactive Patient Education  2017 [employer].  Fall Prevention in the Home Falls can cause injuries. They can happen to people of all ages. There are many things you can do to make your home safe and to help prevent falls. What can I do on the outside of my home? Regularly fix the edges of walkways and driveways and fix any cracks. Remove anything that might make you trip as you walk through a door, such as a raised step or threshold. Trim any bushes or trees on the path to your home. Use bright outdoor lighting. Clear any walking paths of anything that might make someone trip, such as rocks or tools. Regularly check to see if handrails are loose or broken. Make sure that both sides of any steps have handrails. Any raised decks and porches should have guardrails on the edges. Have any leaves, snow, or ice cleared regularly. Use sand or salt on walking paths during winter. Clean up any spills in your garage right away. This includes oil or grease spills. What can I do in the bathroom? Use night lights. Install grab bars by the toilet and in the tub and shower. Do not use towel bars as grab bars. Use non-skid mats or decals in the tub or  shower. If you need to sit down in the shower, use a plastic, non-slip stool. Keep the floor dry. Clean up any water that spills on the floor as soon as it happens. Remove soap buildup in the tub or shower regularly. Attach bath mats securely with double-sided non-slip rug tape. Do not have throw rugs and other things on the floor that can make you trip. What can I do in the bedroom? Use night lights. Make sure that you have a light by your bed that is easy to  reach. Do not use any sheets or blankets that are too big for your bed. They should not hang down onto the floor. Have a firm chair that has side arms. You can use this for support while you get dressed. Do not have throw rugs and other things on the floor that can make you trip. What can I do in the kitchen? Clean up any spills right away. Avoid walking on wet floors. Keep items that you use a lot in easy-to-reach places. If you need to reach something above you, use a strong step stool that has a grab bar. Keep electrical cords out of the way. Do not use floor polish or wax that makes floors slippery. If you must use wax, use non-skid floor wax. Do not have throw rugs and other things on the floor that can make you trip. What can I do with my stairs? Do not leave any items on the stairs. Make sure that there are handrails on both sides of the stairs and use them. Fix handrails that are broken or loose. Make sure that handrails are as long as the stairways. Check any carpeting to make sure that it is firmly attached to the stairs. Fix any carpet that is loose or worn. Avoid having throw rugs at the top or bottom of the stairs. If you do have throw rugs, attach them to the floor with carpet tape. Make sure that you have a light switch at the top of the stairs and the bottom of the stairs. If you do not have them, ask someone to add them for you. What else can I do to help prevent falls? Wear shoes that: Do not have high heels. Have  rubber bottoms. Are comfortable and fit you well. Are closed at the toe. Do not wear sandals. If you use a stepladder: Make sure that it is fully opened. Do not climb a closed stepladder. Make sure that both sides of the stepladder are locked into place. Ask someone to hold it for you, if possible. Clearly mark and make sure that you can see: Any grab bars or handrails. First and last steps. Where the edge of each step is. Use tools that help you move around (mobility aids) if they are needed. These include: Canes. Walkers. Scooters. Crutches. Turn on the lights when you go into a dark area. Replace any light bulbs as soon as they burn out. Set up your furniture so you have a clear path. Avoid moving your furniture around. If any of your floors are uneven, fix them. If there are any pets around you, be aware of where they are. Review your medicines with your doctor. Some medicines can make you feel dizzy. This can increase your chance of falling. Ask your doctor what other things that you can do to help prevent falls. This information is not intended to replace advice given to you by your health care provider. Make sure you discuss any questions you have with your health care provider. Document Released: 04/22/2009 Document Revised: 12/02/2015 Document Reviewed: 07/31/2014 Elsevier Interactive Patient Education  2017 [employer].

## 2022-12-19 DIAGNOSIS — J189 Pneumonia, unspecified organism: Secondary | ICD-10-CM | POA: Diagnosis not present

## 2022-12-27 DIAGNOSIS — E039 Hypothyroidism, unspecified: Secondary | ICD-10-CM

## 2022-12-27 DIAGNOSIS — E785 Hyperlipidemia, unspecified: Secondary | ICD-10-CM

## 2022-12-27 DIAGNOSIS — D649 Anemia, unspecified: Secondary | ICD-10-CM

## 2022-12-27 DIAGNOSIS — R002 Palpitations: Secondary | ICD-10-CM

## 2022-12-28 MED ORDER — METOPROLOL TARTRATE 25 MG PO TABS
12.5000 mg | ORAL_TABLET | Freq: Two times a day (BID) | ORAL | 2 refills | Status: DC
Start: 2022-12-28 — End: 2023-09-19

## 2022-12-28 MED ORDER — LEVOTHYROXINE SODIUM 88 MCG PO TABS
ORAL_TABLET | ORAL | 2 refills | Status: DC
Start: 2022-12-28 — End: 2023-11-20

## 2022-12-28 MED ORDER — SIMVASTATIN 20 MG PO TABS
20.0000 mg | ORAL_TABLET | Freq: Every evening | ORAL | 2 refills | Status: DC
Start: 2022-12-28 — End: 2023-11-20

## 2023-01-23 ENCOUNTER — Other Ambulatory Visit: Payer: PPO

## 2023-01-23 DIAGNOSIS — D649 Anemia, unspecified: Secondary | ICD-10-CM | POA: Diagnosis not present

## 2023-01-23 DIAGNOSIS — E039 Hypothyroidism, unspecified: Secondary | ICD-10-CM | POA: Diagnosis not present

## 2023-01-23 LAB — CBC
HCT: 32.8 % — ABNORMAL LOW (ref 36.0–46.0)
Hemoglobin: 10.7 g/dL — ABNORMAL LOW (ref 12.0–15.0)
MCHC: 32.7 g/dL (ref 30.0–36.0)
MCV: 96.2 fl (ref 78.0–100.0)
Platelets: 262 10*3/uL (ref 150.0–400.0)
RBC: 3.4 Mil/uL — ABNORMAL LOW (ref 3.87–5.11)
RDW: 16.3 % — ABNORMAL HIGH (ref 11.5–15.5)
WBC: 6.3 10*3/uL (ref 4.0–10.5)

## 2023-01-23 LAB — IBC + FERRITIN
Ferritin: 14.4 ng/mL (ref 10.0–291.0)
Iron: 51 ug/dL (ref 42–145)
Saturation Ratios: 11.6 % — ABNORMAL LOW (ref 20.0–50.0)
TIBC: 441 ug/dL (ref 250.0–450.0)
Transferrin: 315 mg/dL (ref 212.0–360.0)

## 2023-01-23 LAB — TSH: TSH: 2.1 u[IU]/mL (ref 0.35–5.50)

## 2023-01-23 LAB — VITAMIN B12: Vitamin B-12: >1500 pg/mL — ABNORMAL HIGH (ref 211–911)

## 2023-01-31 ENCOUNTER — Other Ambulatory Visit: Payer: Self-pay | Admitting: Student

## 2023-01-31 DIAGNOSIS — M19041 Primary osteoarthritis, right hand: Secondary | ICD-10-CM

## 2023-01-31 DIAGNOSIS — M170 Bilateral primary osteoarthritis of knee: Secondary | ICD-10-CM

## 2023-02-01 MED ORDER — TRAMADOL HCL 50 MG PO TABS
50.0000 mg | ORAL_TABLET | Freq: Two times a day (BID) | ORAL | 0 refills | Status: DC | PRN
Start: 2023-02-01 — End: 2023-05-31

## 2023-03-14 DIAGNOSIS — M1611 Unilateral primary osteoarthritis, right hip: Secondary | ICD-10-CM | POA: Diagnosis not present

## 2023-03-14 DIAGNOSIS — M12812 Other specific arthropathies, not elsewhere classified, left shoulder: Secondary | ICD-10-CM | POA: Diagnosis not present

## 2023-03-14 DIAGNOSIS — M12811 Other specific arthropathies, not elsewhere classified, right shoulder: Secondary | ICD-10-CM | POA: Diagnosis not present

## 2023-03-14 DIAGNOSIS — G8929 Other chronic pain: Secondary | ICD-10-CM | POA: Diagnosis not present

## 2023-03-14 DIAGNOSIS — M25551 Pain in right hip: Secondary | ICD-10-CM | POA: Diagnosis not present

## 2023-05-31 ENCOUNTER — Other Ambulatory Visit: Payer: Self-pay

## 2023-05-31 DIAGNOSIS — M19041 Primary osteoarthritis, right hand: Secondary | ICD-10-CM

## 2023-05-31 DIAGNOSIS — M170 Bilateral primary osteoarthritis of knee: Secondary | ICD-10-CM

## 2023-05-31 MED ORDER — TRAMADOL HCL 50 MG PO TABS: 50.0000 mg | ORAL_TABLET | Freq: Two times a day (BID) | ORAL | 0 refills | Status: DC | PRN

## 2023-06-29 DIAGNOSIS — M12811 Other specific arthropathies, not elsewhere classified, right shoulder: Secondary | ICD-10-CM | POA: Diagnosis not present

## 2023-06-29 DIAGNOSIS — M12812 Other specific arthropathies, not elsewhere classified, left shoulder: Secondary | ICD-10-CM | POA: Diagnosis not present

## 2023-09-01 ENCOUNTER — Emergency Department: Payer: HMO

## 2023-09-01 ENCOUNTER — Other Ambulatory Visit: Payer: Self-pay

## 2023-09-01 ENCOUNTER — Inpatient Hospital Stay
Admission: EM | Admit: 2023-09-01 | Discharge: 2023-09-19 | DRG: 562 | Disposition: A | Discharge status: Skilled Nursing Facility | Payer: HMO | Attending: Internal Medicine | Admitting: Internal Medicine

## 2023-09-01 DIAGNOSIS — E785 Hyperlipidemia, unspecified: Secondary | ICD-10-CM | POA: Diagnosis present

## 2023-09-01 DIAGNOSIS — J9811 Atelectasis: Secondary | ICD-10-CM | POA: Diagnosis not present

## 2023-09-01 DIAGNOSIS — M799 Soft tissue disorder, unspecified: Secondary | ICD-10-CM | POA: Diagnosis not present

## 2023-09-01 DIAGNOSIS — S42341A Displaced spiral fracture of shaft of humerus, right arm, initial encounter for closed fracture: Principal | ICD-10-CM | POA: Diagnosis present

## 2023-09-01 DIAGNOSIS — J1569 Pneumonia due to other gram-negative bacteria: Secondary | ICD-10-CM | POA: Diagnosis present

## 2023-09-01 DIAGNOSIS — Z1152 Encounter for screening for COVID-19: Secondary | ICD-10-CM

## 2023-09-01 DIAGNOSIS — I452 Bifascicular block: Secondary | ICD-10-CM | POA: Diagnosis not present

## 2023-09-01 DIAGNOSIS — W19XXXA Unspecified fall, initial encounter: Secondary | ICD-10-CM

## 2023-09-01 DIAGNOSIS — F411 Generalized anxiety disorder: Secondary | ICD-10-CM | POA: Diagnosis not present

## 2023-09-01 DIAGNOSIS — I959 Hypotension, unspecified: Secondary | ICD-10-CM | POA: Diagnosis not present

## 2023-09-01 DIAGNOSIS — M129 Arthropathy, unspecified: Secondary | ICD-10-CM | POA: Diagnosis not present

## 2023-09-01 DIAGNOSIS — E875 Hyperkalemia: Secondary | ICD-10-CM

## 2023-09-01 DIAGNOSIS — I129 Hypertensive chronic kidney disease with stage 1 through stage 4 chronic kidney disease, or unspecified chronic kidney disease: Secondary | ICD-10-CM | POA: Diagnosis not present

## 2023-09-01 DIAGNOSIS — Z7982 Long term (current) use of aspirin: Secondary | ICD-10-CM | POA: Diagnosis not present

## 2023-09-01 DIAGNOSIS — Z9181 History of falling: Secondary | ICD-10-CM | POA: Diagnosis not present

## 2023-09-01 DIAGNOSIS — Z803 Family history of malignant neoplasm of breast: Secondary | ICD-10-CM

## 2023-09-01 DIAGNOSIS — R000 Tachycardia, unspecified: Secondary | ICD-10-CM | POA: Diagnosis not present

## 2023-09-01 DIAGNOSIS — E039 Hypothyroidism, unspecified: Secondary | ICD-10-CM | POA: Diagnosis present

## 2023-09-01 DIAGNOSIS — W19XXXD Unspecified fall, subsequent encounter: Secondary | ICD-10-CM | POA: Diagnosis not present

## 2023-09-01 DIAGNOSIS — K5730 Diverticulosis of large intestine without perforation or abscess without bleeding: Secondary | ICD-10-CM | POA: Diagnosis not present

## 2023-09-01 DIAGNOSIS — Z86718 Personal history of other venous thrombosis and embolism: Secondary | ICD-10-CM

## 2023-09-01 DIAGNOSIS — I471 Supraventricular tachycardia, unspecified: Secondary | ICD-10-CM | POA: Diagnosis not present

## 2023-09-01 DIAGNOSIS — M810 Age-related osteoporosis without current pathological fracture: Secondary | ICD-10-CM | POA: Diagnosis present

## 2023-09-01 DIAGNOSIS — E663 Overweight: Secondary | ICD-10-CM | POA: Diagnosis present

## 2023-09-01 DIAGNOSIS — Z85828 Personal history of other malignant neoplasm of skin: Secondary | ICD-10-CM

## 2023-09-01 DIAGNOSIS — W1830XA Fall on same level, unspecified, initial encounter: Secondary | ICD-10-CM | POA: Diagnosis not present

## 2023-09-01 DIAGNOSIS — K219 Gastro-esophageal reflux disease without esophagitis: Secondary | ICD-10-CM | POA: Insufficient documentation

## 2023-09-01 DIAGNOSIS — S199XXA Unspecified injury of neck, initial encounter: Secondary | ICD-10-CM | POA: Diagnosis not present

## 2023-09-01 DIAGNOSIS — R110 Nausea: Secondary | ICD-10-CM | POA: Diagnosis not present

## 2023-09-01 DIAGNOSIS — I21A1 Myocardial infarction type 2: Secondary | ICD-10-CM | POA: Diagnosis not present

## 2023-09-01 DIAGNOSIS — S42301D Unspecified fracture of shaft of humerus, right arm, subsequent encounter for fracture with routine healing: Secondary | ICD-10-CM | POA: Diagnosis not present

## 2023-09-01 DIAGNOSIS — E872 Acidosis, unspecified: Secondary | ICD-10-CM | POA: Diagnosis not present

## 2023-09-01 DIAGNOSIS — R918 Other nonspecific abnormal finding of lung field: Secondary | ICD-10-CM | POA: Diagnosis not present

## 2023-09-01 DIAGNOSIS — S42291A Other displaced fracture of upper end of right humerus, initial encounter for closed fracture: Secondary | ICD-10-CM | POA: Diagnosis not present

## 2023-09-01 DIAGNOSIS — Z87891 Personal history of nicotine dependence: Secondary | ICD-10-CM

## 2023-09-01 DIAGNOSIS — G43909 Migraine, unspecified, not intractable, without status migrainosus: Secondary | ICD-10-CM | POA: Diagnosis present

## 2023-09-01 DIAGNOSIS — I214 Non-ST elevation (NSTEMI) myocardial infarction: Secondary | ICD-10-CM | POA: Diagnosis not present

## 2023-09-01 DIAGNOSIS — R531 Weakness: Secondary | ICD-10-CM

## 2023-09-01 DIAGNOSIS — M19041 Primary osteoarthritis, right hand: Secondary | ICD-10-CM

## 2023-09-01 DIAGNOSIS — I252 Old myocardial infarction: Secondary | ICD-10-CM | POA: Diagnosis present

## 2023-09-01 DIAGNOSIS — M419 Scoliosis, unspecified: Secondary | ICD-10-CM | POA: Diagnosis present

## 2023-09-01 DIAGNOSIS — I10 Essential (primary) hypertension: Secondary | ICD-10-CM

## 2023-09-01 DIAGNOSIS — I2510 Atherosclerotic heart disease of native coronary artery without angina pectoris: Secondary | ICD-10-CM | POA: Diagnosis not present

## 2023-09-01 DIAGNOSIS — M6259 Muscle wasting and atrophy, not elsewhere classified, multiple sites: Secondary | ICD-10-CM | POA: Diagnosis not present

## 2023-09-01 DIAGNOSIS — E7800 Pure hypercholesterolemia, unspecified: Secondary | ICD-10-CM | POA: Diagnosis present

## 2023-09-01 DIAGNOSIS — Z79890 Hormone replacement therapy (postmenopausal): Secondary | ICD-10-CM

## 2023-09-01 DIAGNOSIS — M5030 Other cervical disc degeneration, unspecified cervical region: Secondary | ICD-10-CM | POA: Diagnosis not present

## 2023-09-01 DIAGNOSIS — Z8249 Family history of ischemic heart disease and other diseases of the circulatory system: Secondary | ICD-10-CM

## 2023-09-01 DIAGNOSIS — K449 Diaphragmatic hernia without obstruction or gangrene: Secondary | ICD-10-CM | POA: Diagnosis not present

## 2023-09-01 DIAGNOSIS — Z96652 Presence of left artificial knee joint: Secondary | ICD-10-CM | POA: Diagnosis present

## 2023-09-01 DIAGNOSIS — N179 Acute kidney failure, unspecified: Secondary | ICD-10-CM

## 2023-09-01 DIAGNOSIS — D62 Acute posthemorrhagic anemia: Secondary | ICD-10-CM | POA: Diagnosis not present

## 2023-09-01 DIAGNOSIS — Y92009 Unspecified place in unspecified non-institutional (private) residence as the place of occurrence of the external cause: Secondary | ICD-10-CM | POA: Diagnosis not present

## 2023-09-01 DIAGNOSIS — M159 Polyosteoarthritis, unspecified: Secondary | ICD-10-CM | POA: Diagnosis not present

## 2023-09-01 DIAGNOSIS — F419 Anxiety disorder, unspecified: Secondary | ICD-10-CM | POA: Diagnosis not present

## 2023-09-01 DIAGNOSIS — M85811 Other specified disorders of bone density and structure, right shoulder: Secondary | ICD-10-CM | POA: Diagnosis not present

## 2023-09-01 DIAGNOSIS — I493 Ventricular premature depolarization: Secondary | ICD-10-CM | POA: Diagnosis not present

## 2023-09-01 DIAGNOSIS — R278 Other lack of coordination: Secondary | ICD-10-CM | POA: Diagnosis not present

## 2023-09-01 DIAGNOSIS — I700 Atherosclerosis of aorta: Secondary | ICD-10-CM | POA: Diagnosis not present

## 2023-09-01 DIAGNOSIS — J189 Pneumonia, unspecified organism: Secondary | ICD-10-CM | POA: Diagnosis present

## 2023-09-01 DIAGNOSIS — K222 Esophageal obstruction: Secondary | ICD-10-CM | POA: Diagnosis not present

## 2023-09-01 DIAGNOSIS — Y9301 Activity, walking, marching and hiking: Secondary | ICD-10-CM | POA: Diagnosis present

## 2023-09-01 DIAGNOSIS — N1831 Chronic kidney disease, stage 3a: Secondary | ICD-10-CM | POA: Diagnosis not present

## 2023-09-01 DIAGNOSIS — M6281 Muscle weakness (generalized): Secondary | ICD-10-CM | POA: Diagnosis not present

## 2023-09-01 DIAGNOSIS — I4719 Other supraventricular tachycardia: Secondary | ICD-10-CM | POA: Diagnosis not present

## 2023-09-01 DIAGNOSIS — E871 Hypo-osmolality and hyponatremia: Secondary | ICD-10-CM | POA: Diagnosis present

## 2023-09-01 DIAGNOSIS — S42301A Unspecified fracture of shaft of humerus, right arm, initial encounter for closed fracture: Secondary | ICD-10-CM

## 2023-09-01 DIAGNOSIS — Z8673 Personal history of transient ischemic attack (TIA), and cerebral infarction without residual deficits: Secondary | ICD-10-CM

## 2023-09-01 DIAGNOSIS — Z743 Need for continuous supervision: Secondary | ICD-10-CM | POA: Diagnosis not present

## 2023-09-01 DIAGNOSIS — M170 Bilateral primary osteoarthritis of knee: Secondary | ICD-10-CM

## 2023-09-01 DIAGNOSIS — R1032 Left lower quadrant pain: Secondary | ICD-10-CM | POA: Diagnosis not present

## 2023-09-01 DIAGNOSIS — K4090 Unilateral inguinal hernia, without obstruction or gangrene, not specified as recurrent: Secondary | ICD-10-CM | POA: Diagnosis not present

## 2023-09-01 DIAGNOSIS — R059 Cough, unspecified: Secondary | ICD-10-CM | POA: Diagnosis not present

## 2023-09-01 DIAGNOSIS — R062 Wheezing: Secondary | ICD-10-CM | POA: Diagnosis not present

## 2023-09-01 DIAGNOSIS — K567 Ileus, unspecified: Secondary | ICD-10-CM | POA: Diagnosis not present

## 2023-09-01 DIAGNOSIS — Z79899 Other long term (current) drug therapy: Secondary | ICD-10-CM

## 2023-09-01 DIAGNOSIS — Z604 Social exclusion and rejection: Secondary | ICD-10-CM | POA: Diagnosis present

## 2023-09-01 DIAGNOSIS — R109 Unspecified abdominal pain: Secondary | ICD-10-CM | POA: Diagnosis not present

## 2023-09-01 DIAGNOSIS — Z66 Do not resuscitate: Secondary | ICD-10-CM | POA: Diagnosis present

## 2023-09-01 DIAGNOSIS — R2689 Other abnormalities of gait and mobility: Secondary | ICD-10-CM | POA: Diagnosis not present

## 2023-09-01 DIAGNOSIS — S0990XA Unspecified injury of head, initial encounter: Secondary | ICD-10-CM | POA: Diagnosis not present

## 2023-09-01 DIAGNOSIS — R41841 Cognitive communication deficit: Secondary | ICD-10-CM | POA: Diagnosis not present

## 2023-09-01 DIAGNOSIS — W19XXXS Unspecified fall, sequela: Secondary | ICD-10-CM | POA: Diagnosis not present

## 2023-09-01 DIAGNOSIS — Z885 Allergy status to narcotic agent status: Secondary | ICD-10-CM

## 2023-09-01 DIAGNOSIS — M25519 Pain in unspecified shoulder: Secondary | ICD-10-CM | POA: Diagnosis not present

## 2023-09-01 HISTORY — DX: Other ill-defined heart diseases: I51.89

## 2023-09-01 HISTORY — DX: Atrioventricular block, first degree: I44.0

## 2023-09-01 HISTORY — DX: Atrioventricular block, second degree: I44.1

## 2023-09-01 HISTORY — DX: Transient cerebral ischemic attack, unspecified: G45.9

## 2023-09-01 HISTORY — DX: Supraventricular tachycardia, unspecified: I47.10

## 2023-09-01 LAB — BASIC METABOLIC PANEL
Anion gap: 18 — ABNORMAL HIGH (ref 5–15)
BUN: 45 mg/dL — ABNORMAL HIGH (ref 8–23)
CO2: 20 mmol/L — ABNORMAL LOW (ref 22–32)
Calcium: 9.6 mg/dL (ref 8.9–10.3)
Chloride: 96 mmol/L — ABNORMAL LOW (ref 98–111)
Creatinine, Ser: 0.98 mg/dL (ref 0.44–1.00)
GFR, Estimated: 57 mL/min — ABNORMAL LOW (ref >60)
Glucose, Bld: 134 mg/dL — ABNORMAL HIGH (ref 70–99)
Potassium: 3.8 mmol/L (ref 3.5–5.1)
Sodium: 134 mmol/L — ABNORMAL LOW (ref 135–145)

## 2023-09-01 LAB — CBC WITH DIFFERENTIAL/PLATELET
Abs Immature Granulocytes: 0.37 10*3/uL — ABNORMAL HIGH (ref 0.00–0.07)
Basophils Absolute: 0 10*3/uL (ref 0.0–0.1)
Basophils Relative: 0 %
Eosinophils Absolute: 0.1 10*3/uL (ref 0.0–0.5)
Eosinophils Relative: 1 %
HCT: 34.4 % — ABNORMAL LOW (ref 36.0–46.0)
Hemoglobin: 11.3 g/dL — ABNORMAL LOW (ref 12.0–15.0)
Immature Granulocytes: 7 %
Lymphocytes Relative: 18 %
Lymphs Abs: 1 10*3/uL (ref 0.7–4.0)
MCH: 32.7 pg (ref 26.0–34.0)
MCHC: 32.8 g/dL (ref 30.0–36.0)
MCV: 99.4 fL (ref 80.0–100.0)
Monocytes Absolute: 0.9 10*3/uL (ref 0.1–1.0)
Monocytes Relative: 16 %
Neutro Abs: 3.4 10*3/uL (ref 1.7–7.7)
Neutrophils Relative %: 58 %
Platelets: 180 10*3/uL (ref 150–400)
RBC: 3.46 MIL/uL — ABNORMAL LOW (ref 3.87–5.11)
RDW: 15.9 % — ABNORMAL HIGH (ref 11.5–15.5)
WBC: 5.7 10*3/uL (ref 4.0–10.5)
nRBC: 0 % (ref 0.0–0.2)

## 2023-09-01 LAB — URINALYSIS, W/ REFLEX TO CULTURE (INFECTION SUSPECTED)
Bacteria, UA: NONE SEEN
Bilirubin Urine: NEGATIVE
Glucose, UA: NEGATIVE mg/dL
Hgb urine dipstick: NEGATIVE
Ketones, ur: 20 mg/dL — AB
Leukocytes,Ua: NEGATIVE
Nitrite: NEGATIVE
Protein, ur: NEGATIVE mg/dL
Specific Gravity, Urine: 1.01 (ref 1.005–1.030)
WBC, UA: 0 WBC/hpf (ref 0–5)
pH: 5 (ref 5.0–8.0)

## 2023-09-01 LAB — TROPONIN I (HIGH SENSITIVITY)
Troponin I (High Sensitivity): 25 ng/L — ABNORMAL HIGH (ref <18)
Troponin I (High Sensitivity): 410 ng/L (ref <18)

## 2023-09-01 LAB — PROTIME-INR
INR: 1.1 (ref 0.8–1.2)
Prothrombin Time: 14.1 s (ref 11.4–15.2)

## 2023-09-01 LAB — APTT: aPTT: 30 s (ref 24–36)

## 2023-09-01 MED ORDER — BISACODYL 10 MG RE SUPP
10.0000 mg | RECTAL | Status: DC | PRN
  Administered 2023-09-04: 10 mg via RECTAL
  Filled 2023-09-01: qty 1

## 2023-09-01 MED ORDER — SODIUM CHLORIDE 0.9 % IV BOLUS
500.0000 mL | Freq: Once | INTRAVENOUS | Status: AC
  Administered 2023-09-01: 500 mL via INTRAVENOUS

## 2023-09-01 MED ORDER — MORPHINE SULFATE (PF) 2 MG/ML IV SOLN
2.0000 mg | INTRAVENOUS | Status: DC | PRN
  Administered 2023-09-02 (×4): 2 mg via INTRAVENOUS
  Filled 2023-09-01 (×4): qty 1

## 2023-09-01 MED ORDER — ALUM & MAG HYDROXIDE-SIMETH 200-200-20 MG/5ML PO SUSP: 10.0000 mL | ORAL | Status: DC | PRN

## 2023-09-01 MED ORDER — ADULT MULTIVITAMIN W/MINERALS CH
1.0000 | ORAL_TABLET | Freq: Every day | ORAL | Status: DC
  Administered 2023-09-03 – 2023-09-19 (×17): 1 via ORAL
  Filled 2023-09-01 (×17): qty 1

## 2023-09-01 MED ORDER — ASPIRIN 81 MG PO CHEW
324.0000 mg | CHEWABLE_TABLET | Freq: Once | ORAL | Status: AC
Start: 2023-09-01 — End: 2023-09-01
  Administered 2023-09-01: 324 mg via ORAL
  Filled 2023-09-01: qty 4

## 2023-09-01 MED ORDER — SIMVASTATIN 10 MG PO TABS: 20.0000 mg | ORAL_TABLET | Freq: Every evening | ORAL | Status: DC

## 2023-09-01 MED ORDER — ASPIRIN 300 MG RE SUPP: 300.0000 mg | RECTAL | Status: DC

## 2023-09-01 MED ORDER — HYDROMORPHONE HCL 1 MG/ML IJ SOLN
1.0000 mg | Freq: Once | INTRAMUSCULAR | Status: AC
  Administered 2023-09-01: 1 mg via INTRAVENOUS
  Filled 2023-09-01: qty 1

## 2023-09-01 MED ORDER — ASPIRIN 81 MG PO TBEC
81.0000 mg | DELAYED_RELEASE_TABLET | Freq: Every day | ORAL | Status: DC
  Administered 2023-09-03 – 2023-09-19 (×17): 81 mg via ORAL
  Filled 2023-09-01 (×17): qty 1

## 2023-09-01 MED ORDER — FENTANYL CITRATE PF 50 MCG/ML IJ SOSY
50.0000 ug | PREFILLED_SYRINGE | Freq: Once | INTRAMUSCULAR | Status: AC
  Administered 2023-09-01: 50 ug via INTRAVENOUS
  Filled 2023-09-01: qty 1

## 2023-09-01 MED ORDER — ENALAPRIL MALEATE 10 MG PO TABS
20.0000 mg | ORAL_TABLET | Freq: Every day | ORAL | Status: DC
  Filled 2023-09-01: qty 2
  Filled 2023-09-01: qty 1

## 2023-09-01 MED ORDER — SODIUM CHLORIDE 0.9 % IV SOLN: INTRAVENOUS | Status: DC

## 2023-09-01 MED ORDER — LEVOTHYROXINE SODIUM 88 MCG PO TABS
88.0000 ug | ORAL_TABLET | Freq: Every day | ORAL | Status: DC
  Administered 2023-09-03 – 2023-09-19 (×17): 88 ug via ORAL
  Filled 2023-09-01 (×19): qty 1

## 2023-09-01 MED ORDER — TRAMADOL HCL 50 MG PO TABS
50.0000 mg | ORAL_TABLET | Freq: Two times a day (BID) | ORAL | Status: DC | PRN
  Filled 2023-09-01: qty 1

## 2023-09-01 MED ORDER — ATORVASTATIN CALCIUM 80 MG PO TABS: 80.0000 mg | ORAL_TABLET | Freq: Every day | ORAL | Status: DC

## 2023-09-01 MED ORDER — ASPIRIN 81 MG PO CHEW: 324.0000 mg | CHEWABLE_TABLET | ORAL | Status: DC

## 2023-09-01 MED ORDER — MORPHINE SULFATE (PF) 4 MG/ML IV SOLN
4.0000 mg | Freq: Once | INTRAVENOUS | Status: AC
  Administered 2023-09-01: 4 mg via INTRAVENOUS
  Filled 2023-09-01: qty 1

## 2023-09-01 MED ORDER — HEPARIN (PORCINE) 25000 UT/250ML-% IV SOLN
700.0000 [IU]/h | INTRAVENOUS | Status: DC
  Administered 2023-09-01: 700 [IU]/h via INTRAVENOUS
  Filled 2023-09-01: qty 250

## 2023-09-01 MED ORDER — HEPARIN BOLUS VIA INFUSION
3500.0000 [IU] | Freq: Once | INTRAVENOUS | Status: AC
  Administered 2023-09-01: 3500 [IU] via INTRAVENOUS
  Filled 2023-09-01: qty 3500

## 2023-09-01 MED ORDER — PANTOPRAZOLE SODIUM 40 MG PO TBEC
40.0000 mg | DELAYED_RELEASE_TABLET | Freq: Every day | ORAL | Status: DC
  Administered 2023-09-03 – 2023-09-19 (×17): 40 mg via ORAL
  Filled 2023-09-01 (×17): qty 1

## 2023-09-01 MED ORDER — POLYETHYLENE GLYCOL 3350 17 G PO PACK
17.0000 g | PACK | Freq: Every day | ORAL | Status: DC | PRN
  Administered 2023-09-02 – 2023-09-07 (×4): 17 g via ORAL
  Filled 2023-09-01 (×4): qty 1

## 2023-09-01 MED ORDER — ALPRAZOLAM 0.25 MG PO TABS
0.2500 mg | ORAL_TABLET | Freq: Two times a day (BID) | ORAL | Status: DC | PRN
  Administered 2023-09-03 – 2023-09-13 (×17): 0.25 mg via ORAL
  Filled 2023-09-01 (×17): qty 1

## 2023-09-01 MED ORDER — ACETAMINOPHEN 325 MG PO TABS
650.0000 mg | ORAL_TABLET | ORAL | Status: DC | PRN
  Administered 2023-09-06 – 2023-09-13 (×18): 650 mg via ORAL
  Filled 2023-09-01 (×19): qty 2

## 2023-09-01 MED ORDER — METOPROLOL TARTRATE 25 MG PO TABS: 12.5000 mg | ORAL_TABLET | Freq: Two times a day (BID) | ORAL | Status: DC

## 2023-09-01 MED ORDER — ONDANSETRON HCL 4 MG/2ML IJ SOLN
4.0000 mg | Freq: Once | INTRAMUSCULAR | Status: AC
  Administered 2023-09-01: 4 mg via INTRAVENOUS
  Filled 2023-09-01: qty 2

## 2023-09-01 MED ORDER — NITROGLYCERIN 0.4 MG SL SUBL: 0.4000 mg | SUBLINGUAL_TABLET | SUBLINGUAL | Status: DC | PRN

## 2023-09-01 MED ORDER — METOPROLOL TARTRATE 25 MG PO TABS: 25.0000 mg | ORAL_TABLET | Freq: Two times a day (BID) | ORAL | Status: DC

## 2023-09-01 MED ORDER — ASPIRIN 81 MG PO TBEC: 81.0000 mg | DELAYED_RELEASE_TABLET | Freq: Every day | ORAL | Status: DC

## 2023-09-01 MED ORDER — ONDANSETRON HCL 4 MG/2ML IJ SOLN
4.0000 mg | Freq: Four times a day (QID) | INTRAMUSCULAR | Status: DC | PRN
  Administered 2023-09-02 – 2023-09-13 (×2): 4 mg via INTRAVENOUS
  Filled 2023-09-01 (×3): qty 2

## 2023-09-01 NOTE — Progress Notes (Signed)
 PHARMACY - ANTICOAGULATION CONSULT NOTE  Pharmacy Consult for Heparin Infusion Indication: chest pain/ACS  Allergies  Allergen Reactions   Codeine Nausea Only    Patient Measurements: Height: 5' (152.4 cm) Weight: 62.6 kg (138 lb) IBW/kg (Calculated) : 45.5 Heparin Dosing Weight: 58.6 kg  Vital Signs: Temp: 97.8 F (36.6 C) (02/22 1744) Temp Source: Oral (02/22 1744) BP: 144/76 (02/22 1900) Pulse Rate: 66 (02/22 1900)  Labs: Recent Labs    09/01/23 1747 09/01/23 2030  HGB 11.3*  --   HCT 34.4*  --   PLT 180  --   CREATININE 0.98  --   TROPONINIHS 25* 410*    Estimated Creatinine Clearance: 34.7 mL/min (by C-G formula based on SCr of 0.98 mg/dL).   Medical History: Past Medical History:  Diagnosis Date   Anxiety state, unspecified    Arthritis    Atherosclerosis    Cancer (HCC)    skin cancer   Cellulitis 10/20/2022   Diverticulosis    Esophageal stricture    Essential hypertension, benign    Expressive aphasia 06/12/2022   Gastric ulcer    Hiatal hernia    History of DVT (deep vein thrombosis) yrs ago   legs   IDA (iron deficiency anemia)    Inguinal hernia without mention of obstruction or gangrene, unilateral or unspecified, (not specified as recurrent)    2014   Internal hemorrhoids    Mild mitral regurgitation    a. 04/2010 Echo: nl EF, mild diast dysfxn, trace PR, mild TR, mild-mod MR.   Non-obstructive CAD    a. 04/2010 Myoview: EF 77%, fixed apical/inferior defect;  b. 04/2010 Cath: LAD 1m, LCX/RCA minor irregs, EF 60%;  c. 04/2011 Myoview: low risk.   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   Suprapubic mass 08/20/2012   UI (urinary incontinence)    Umbilical hernia without mention of obstruction or gangrene    2014   Unspecified hypothyroidism    Vitamin D deficiency      Medications:  No anticoagulation use at home  Assessment: Patient is a 88 year old female who presents with an NSTEMI. Troponin 25>410. Pharmacy has been consulted to initiate patient on a heparin infusion.   Baseline INR and aPTT ordered.  No signs/symptoms of bleeding noted. Hgb 11.3. PLT 180.  Goal of Therapy:  Heparin level 0.3-0.7 units/ml Monitor platelets by anticoagulation protocol: Yes   Plan:  Give 3500 unit bolus x 1 Start heparin infusion at a rate of 700 units/hr Check heparin level in 8 hours Monitor CBC daily   Merryl Hacker, PharmD Clinical Pharmacist  09/01/2023,9:34 PM

## 2023-09-01 NOTE — Assessment & Plan Note (Signed)
-   The patient will be placed on high-dose statin therapy as mentioned above.

## 2023-09-01 NOTE — Assessment & Plan Note (Signed)
 -  We will continue PPI therapy

## 2023-09-01 NOTE — Assessment & Plan Note (Signed)
 -  We will continue Synthroid.

## 2023-09-01 NOTE — ED Provider Notes (Signed)
 [facility] Provider Note    Event Date/Time   First MD Initiated Contact with Patient 09/01/23 1745     (approximate)   History   Chief Complaint: Fall   HPI  Kathleen Paul is a 88 y.o. female PUD, DVT, anxiety, hypertension who was in her usual state of health, walking with her walker at home, after using the bathroom she fell and hit her head.  Thinks that her knee gave out on her.  Denies loss of consciousness.  She did hit the left side of her head on the floor.  Not taking blood thinners.  She has been nauseated and vomiting a few times.  Complains of pain in the left side of the head as well as in the right upper arm which is worse with movement.  No neck pain.  No chest pain or hip pain.          Physical Exam   Triage Vital Signs: ED Triage Vitals  Encounter Vitals Group     BP 09/01/23 1744 (!) 149/82     Systolic BP Percentile --      Diastolic BP Percentile --      Pulse Rate 09/01/23 1744 (!) 46     Resp 09/01/23 1744 11     Temp 09/01/23 1744 97.8 F (36.6 C)     Temp Source 09/01/23 1744 Oral     SpO2 09/01/23 1744 100 %     Weight 09/01/23 1751 138 lb (62.6 kg)     Height 09/01/23 1751 5' (1.524 m)     Head Circumference --      Peak Flow --      Pain Score 09/01/23 1742 7     Pain Loc --      Pain Education --      Exclude from Growth Chart --     Most recent vital signs: Vitals:   09/01/23 2334 09/01/23 2335  BP:    Pulse: 77 77  Resp: (!) 24 20  Temp:    SpO2: 100% 100%    General: Awake, no distress.  CV:  Good peripheral perfusion.  Bradycardia, normal distal pulses Resp:  Normal effort.  Clear to auscultation bilaterally Abd:  No distention.  Soft nontender Other:  Small contusion on the left parietal scalp.  No C-spine tenderness.  No open lacerations.  No Battle sign or raccoon eyes.  No otorrhea or rhinorrhea.  There is tenderness at the distal right humerus, no open wounds.  Hips are nontender, pelvis  stable, full range of motion in lower extremities   ED Results / Procedures / Treatments   Labs (all labs ordered are listed, but only abnormal results are displayed) Labs Reviewed  BASIC METABOLIC PANEL - Abnormal; Notable for the following components:      Result Value   Sodium 134 (*)    Chloride 96 (*)    CO2 20 (*)    Glucose, Bld 134 (*)    BUN 45 (*)    GFR, Estimated 57 (*)    Anion gap 18 (*)    All other components within normal limits  CBC WITH DIFFERENTIAL/PLATELET - Abnormal; Notable for the following components:   RBC 3.46 (*)    Hemoglobin 11.3 (*)    HCT 34.4 (*)    RDW 15.9 (*)    Abs Immature Granulocytes 0.37 (*)    All other components within normal limits  URINALYSIS, W/ REFLEX TO CULTURE (INFECTION SUSPECTED) -  Abnormal; Notable for the following components:   Color, Urine STRAW (*)    APPearance CLEAR (*)    Ketones, ur 20 (*)    All other components within normal limits  TROPONIN I (HIGH SENSITIVITY) - Abnormal; Notable for the following components:   Troponin I (High Sensitivity) 25 (*)    All other components within normal limits  TROPONIN I (HIGH SENSITIVITY) - Abnormal; Notable for the following components:   Troponin I (High Sensitivity) 410 (*)    All other components within normal limits  APTT  PROTIME-INR  HEPARIN LEVEL (UNFRACTIONATED)  CBC  LIPOPROTEIN A (LPA)  BASIC METABOLIC PANEL  LIPID PANEL  PROTIME-INR     EKG Interpreted by me Sinus rhythm rate of 67, normal axis, right bundle branch block, first-degree AV block, slight ST elevation in V2 and V3, nondiagnostic, not significantly changed compared to previous EKG on October 20, 2022.  Repeat EKG interpreted by me, sinus rhythm rate of 64, no interval change.   RADIOLOGY CT head interpreted by me, negative for intracranial hemorrhage.  Radiology report reviewed  CT cervical spine unremarkable  X-ray right humerus shows midshaft humerus fracture, oblique and  displaced   PROCEDURES:  Procedures   MEDICATIONS ORDERED IN ED: Medications  heparin ADULT infusion 100 units/mL (25000 units/233mL) (700 Units/hr Intravenous New Bag/Given 09/01/23 2144)  traMADol (ULTRAM) tablet 50 mg (has no administration in time range)  enalapril (VASOTEC) tablet 20 mg (has no administration in time range)  levothyroxine (SYNTHROID) tablet 88 mcg (has no administration in time range)  alum & mag hydroxide-simeth (MAALOX/MYLANTA) 200-200-20 MG/5ML suspension 10 mL (has no administration in time range)  bisacodyl (DULCOLAX) suppository 10 mg (has no administration in time range)  pantoprazole (PROTONIX) EC tablet 40 mg (has no administration in time range)  polyethylene glycol (MIRALAX / GLYCOLAX) packet 17 g (has no administration in time range)  multivitamin with minerals tablet 1 tablet (has no administration in time range)  aspirin EC tablet 81 mg (has no administration in time range)  nitroGLYCERIN (NITROSTAT) SL tablet 0.4 mg (has no administration in time range)  acetaminophen (TYLENOL) tablet 650 mg (has no administration in time range)  ondansetron (ZOFRAN) injection 4 mg (has no administration in time range)  0.9 %  sodium chloride infusion ( Intravenous New Bag/Given 09/01/23 2321)  atorvastatin (LIPITOR) tablet 80 mg (has no administration in time range)  ALPRAZolam (XANAX) tablet 0.25 mg (has no administration in time range)  morphine (PF) 2 MG/ML injection 2 mg (has no administration in time range)  sodium chloride 0.9 % bolus 500 mL (0 mLs Intravenous Stopped 09/01/23 1857)  fentaNYL (SUBLIMAZE) injection 50 mcg (50 mcg Intravenous Given 09/01/23 1802)  morphine (PF) 4 MG/ML injection 4 mg (4 mg Intravenous Given 09/01/23 2016)  ondansetron (ZOFRAN) injection 4 mg (4 mg Intravenous Given 09/01/23 2016)  HYDROmorphone (DILAUDID) injection 1 mg (1 mg Intravenous Given 09/01/23 2140)  aspirin chewable tablet 324 mg (324 mg Oral Given 09/01/23 2147)  heparin  bolus via infusion 3,500 Units (3,500 Units Intravenous Bolus from Bag 09/01/23 2146)     IMPRESSION / MDM / ASSESSMENT AND PLAN / ED COURSE  I reviewed the triage vital signs and the nursing notes.  DDx: Intracranial hemorrhage, C-spine fracture, humerus fracture, electrolyte abnormality, AKI, UTI, arrhythmia.  Doubt ACS PE dissection stroke or sepsis.  Patient's presentation is most consistent with acute presentation with potential threat to life or bodily function.  Patient presents with a likely mechanical fall at  home.  Will check labs, trauma workup with right humerus x-ray, CT head cervical spine.   Clinical Course as of 09/02/23 0046  Sat Sep 01, 2023  2010 Humerus fx d/w ortho Dr. Hyacinth Meeker. Rec. Coaptation splint, One handed walker, pain control, [PS]  2127 Troponin I (High Sensitivity)(!!): 410 Repeat trop sig. Elevated. Still no CP/SOB. D/w STEMI cardiologist Dr. Lynnette Caffey regarding serial EKGs and trop change, advises with RBBB and no chest pain, would not advise any intervention other than typical NSTEMI medical management. [PS]    Clinical Course User Index [PS] Sharman Cheek, MD    ----------------------------------------- 12:46 AM on 09/02/2023 ----------------------------------------- Case discussed with hospitalist   FINAL CLINICAL IMPRESSION(S) / ED DIAGNOSES   Final diagnoses:  NSTEMI (non-ST elevated myocardial infarction) (HCC)  Closed fracture of shaft of right humerus, unspecified fracture morphology, initial encounter     Rx / DC Orders   ED Discharge Orders     None        Note:  This document was prepared using Dragon voice recognition software and may include unintentional dictation errors.   Sharman Cheek, MD 09/02/23 [phone]

## 2023-09-01 NOTE — ED Notes (Signed)
 Patient transported to CT

## 2023-09-01 NOTE — ED Triage Notes (Signed)
 Patient from home. Had a fall. Patient states knees hurt. Patietn DID hit her head. Not on blood thinners. AOX4. Patietn currently vomiting into bag. Patient has deformity to right shoulder. No loss of conciousness. Patient started vomiting after they sat patient up. EDP at bedside.

## 2023-09-01 NOTE — Assessment & Plan Note (Signed)
 Blood pressure little soft.  Patient also had some intermittent bradycardia Concern of NSTEMI and bleeding. -Giving some IV fluid -Holding home metoprolol

## 2023-09-01 NOTE — H&P (Signed)
 [place]   PATIENT NAME: Kathleen Paul    MR#:  [id number]  DATE OF BIRTH:  Aug 14, 1935  DATE OF ADMISSION:  09/01/2023  PRIMARY CARE PHYSICIAN: Doreene Nest, NP   Patient is coming from: Home  REQUESTING/REFERRING PHYSICIAN: Alfonse Flavors, MD  CHIEF COMPLAINT:   Chief Complaint  Patient presents with   Fall    HISTORY OF PRESENT ILLNESS:  Kathleen Paul is a 88 y.o. Caucasian female with medical history significant for anxiety, osteoarthritis, diverticulosis, gastric ulcer, history of DVT, dyslipidemia, essential hypertension and nonobstructive coronary artery disease, who presented to the emergency room with acute onset of fall while ambulating with her walker at home after using the bathroom.  She hit her head.  She believes that her knee gave out on her.  No presyncope or syncope.  No paresthesias or focal muscle weakness.  She had the left side of her head on the floor.  She admitted to nausea and vomiting a few times.  She complains of right upper arm pain that is worse on movement.  No neck pain and no other paresthesias or focal muscle weakness.  She denies any chest pain or palpitations.  No dysuria, oliguria or hematuria or flank pain.  No cough or wheezing or hemoptysis.  No other bleeding diathesis.  ED Course: Upon presentation to the emergency room, BP was 149/82 with heart rate of 46 and later 37 then 60 with otherwise normal vital signs.  Labs revealed mild hyponatremia 134 and hypochloremia of 96 with a CO2 of 20, BUN of 45 and anion gap of 18.  High sensitive troponin I was 25 and later 410.  CBC showed hemoglobin 11.3 and hematocrit 34.4.  Coagulation profile was normal UA was unremarkable. EKG as reviewed by me : EKG showed normal sinus rhythm with a rate of 67 with multiple PVCs, right bundle branch block and inferior Q waves. Imaging: Contrasted CT scan revealed no acute intracranial normalities.  C-spine CT showed advanced degenerative disc disease  and facet disease diffusely with no acute bony abnormality. Right humerus x-ray reveals the following: 1. Acute mildly displaced fracture of the mid right humeral diaphysis. 2. 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  The patient was given IV heparin bolus and drip, 1 mg of IV Dilaudid, 4 mg of IV morphine sulfate, 50 mcg of IV fentanyl, 4 mg of IV Zofran and 500 mL IV normal saline. PAST MEDICAL HISTORY:   Past Medical History:  Diagnosis Date   Anxiety state, unspecified    Arthritis    Atherosclerosis    Cancer (HCC)    skin cancer   Cellulitis 10/20/2022   Diverticulosis    Esophageal stricture    Essential hypertension, benign    Expressive aphasia 06/12/2022   Gastric ulcer    Hiatal hernia    History of DVT (deep vein thrombosis) yrs ago   legs   IDA (iron deficiency anemia)    Inguinal hernia without mention of obstruction or gangrene, unilateral or unspecified, (not specified as recurrent)    2014   Internal hemorrhoids    Mild mitral regurgitation    a. 04/2010 Echo: nl EF, mild diast dysfxn, trace PR, mild TR, mild-mod MR.   Non-obstructive CAD    a. 04/2010 Myoview: EF 77%, fixed apical/inferior defect;  b. 04/2010 Cath: LAD 3m, LCX/RCA minor irregs, EF 60%;  c. 04/2011 Myoview: low risk.   Overweight (BMI 25.0-29.9) 06/04/2019   Pure hypercholesterolemia  Rupture of left patellar tendon    05-26-2019   Scoliosis    Sepsis (HCC) 10/21/2022   Status post total left knee replacement 02/11/2019   Suprapubic mass 08/20/2012   UI (urinary incontinence)    Umbilical hernia without mention of obstruction or gangrene    2014   Unspecified hypothyroidism    Vitamin D deficiency     PAST SURGICAL HISTORY:   Past Surgical History:  Procedure Laterality Date   ABDOMINAL HYSTERECTOMY  2008   complete    BIOPSY  04/02/2018   Procedure: BIOPSY;  Surgeon: Lemar Lofty., MD;  Location: [facility];  Service: Gastroenterology;;   CARDIAC  CATHETERIZATION  2011   [facility]   COLONOSCOPY  2003   [facility]   ESOPHAGOGASTRODUODENOSCOPY (EGD) WITH PROPOFOL N/A 04/02/2018   Procedure: ESOPHAGOGASTRODUODENOSCOPY (EGD) WITH PROPOFOL;  Surgeon: Lemar Lofty., MD;  Location: [facility];  Service: Gastroenterology;  Laterality: N/A;   FOREIGN BODY REMOVAL  04/02/2018   Procedure: FOREIGN BODY REMOVAL;  Surgeon: Meridee Score Netty Starring., MD;  Location: [facility];  Service: Gastroenterology;;   HERNIA REPAIR Right 09/23/2012   repair RIH   LAPAROSCOPIC HYSTERECTOMY     PATELLAR TENDON REPAIR Left 06/03/2019   Procedure: LEFT PATELLA TENDON REPAIR, RIGHT KNEE INJECTION.;  Surgeon: Durene Romans, MD;  Location: [facility];  Service: Orthopedics;  Laterality: Left;   PATELLAR TENDON REPAIR Left 07/01/2019   Procedure: OPEN PATELLA TENDON REPAIR;  Surgeon: Durene Romans, MD;  Location: [facility];  Service: Orthopedics;  Laterality: Left;   TONSILLECTOMY     TOTAL KNEE ARTHROPLASTY Left 02/11/2019   Procedure: TOTAL KNEE ARTHROPLASTY;  Surgeon: Durene Romans, MD;  Location: [facility];  Service: Orthopedics;  Laterality: Left;  70 mins    SOCIAL HISTORY:   Social History   Tobacco Use   Smoking status: Former    Current packs/day: 0.00    Average packs/day: 0.5 packs/day for 30.0 years (15.0 ttl pk-yrs)    Types: Cigarettes    Start date: 07/10/1958    Quit date: 07/10/1988    Years since quitting: 35.1   Smokeless tobacco: Never  Substance Use Topics   Alcohol use: No    FAMILY HISTORY:   Family History  Problem Relation Age of Onset   Heart disease Mother    Heart attack Mother    Heart disease Father    Heart attack Father    Breast cancer Sister    Breast cancer Maternal Aunt    Heart disease Brother        sister    DRUG ALLERGIES:   Allergies  Allergen Reactions   Codeine Nausea Only    REVIEW OF SYSTEMS:   ROS As per history of present illness. All pertinent systems were reviewed above. Constitutional,  HEENT, cardiovascular, respiratory, GI, GU, musculoskeletal, neuro, psychiatric, endocrine, integumentary and hematologic systems were reviewed and are otherwise negative/unremarkable except for positive findings mentioned above in the HPI.   MEDICATIONS AT HOME:   Prior to Admission medications   Medication Sig Start Date End Date Taking? Authorizing Provider  acetaminophen (TYLENOL) 325 MG tablet Take 650 mg by mouth every 6 (six) hours as needed.    [provider]  alum & mag hydroxide-simeth (MAALOX PLUS) 400-400-40 MG/5ML suspension Take 5 mLs by mouth every 4 (four) hours as needed for indigestion.    [provider]  aspirin EC 81 MG tablet Take 1 tablet (81 mg total) by mouth daily. Swallow whole. Aspirin & plavix  for 21 days, f/by aspirin alone 06/12/22   Delfino Lovett, MD  bisacodyl (DULCOLAX) 10 MG suppository Place 10 mg rectally as needed for moderate constipation.    [provider]  Calcium-Magnesium-Vitamin D (CALCIUM 1200+D3 PO) Take 1 tablet by mouth daily.    [provider]  enalapril (VASOTEC) 20 MG tablet Take 1 tablet (20 mg total) by mouth daily. for blood pressure. 11/23/22 11/23/23  Doreene Nest, NP  levothyroxine (SYNTHROID) 88 MCG tablet Take 1 tablet by mouth every morning on an empty stomach with water only.  No food or other medications for 30 minutes. 12/28/22   Doreene Nest, NP  lidocaine (LIDODERM) 5 % Apply to both shoulders, Both knees, right hip topically in the morning for pain. Remove & Discard patch within 12 hours or as directed by MD Patient not taking: Reported on 11/22/2022    [provider]  metoprolol tartrate (LOPRESSOR) 25 MG tablet Take 0.5 tablets (12.5 mg total) by mouth 2 (two) times daily. 12/28/22   Doreene Nest, NP  Multiple Vitamin (MULTIVITAMIN PO) Take 1 tablet by mouth daily.    [provider]  omeprazole (PRILOSEC) 40 MG capsule Take 1 capsule (40 mg total) by mouth daily.  for heartburn. 11/22/22   Doreene Nest, NP  polyethylene glycol (MIRALAX / GLYCOLAX) 17 g packet Take 17 g by mouth daily.    [provider]  simvastatin (ZOCOR) 20 MG tablet Take 1 tablet (20 mg total) by mouth every evening. for cholesterol. 12/28/22   Doreene Nest, NP  traMADol (ULTRAM) 50 MG tablet Take 1 tablet (50 mg total) by mouth every 12 (twelve) hours as needed for severe pain (pain score 7-10). 05/31/23   Doreene Nest, NP  Zinc Oxide (TRIPLE PASTE) 12.8 % ointment Apply 1 Application topically as needed for irritation.    [provider]      VITAL SIGNS:  Blood pressure (!) 165/80, pulse (!) 28, temperature 97.8 F (36.6 C), temperature source Oral, resp. rate 14, height 5' (1.524 m), weight 62.6 kg, SpO2 100%.  PHYSICAL EXAMINATION:  Physical Exam  GENERAL:  88 y.o.-year-old Caucasian female patient lying in the bed with no acute distress.  EYES: Pupils equal, round, reactive to light and accommodation. No scleral icterus. Extraocular muscles intact.  HEENT: Head atraumatic, normocephalic. Oropharynx and nasopharynx clear.  NECK:  Supple, no jugular venous distention. No thyroid enlargement, no tenderness.  LUNGS: Normal breath sounds bilaterally, no wheezing, rales,rhonchi or crepitation. No use of accessory muscles of respiration.  CARDIOVASCULAR: Regular rate and rhythm, S1, S2 normal. No murmurs, rubs, or gallops.  ABDOMEN: Soft, nondistended, nontender. Bowel sounds present. No organomegaly or mass.  EXTREMITIES: No pedal edema, cyanosis, or clubbing.  Right upper extremity is in a sling given her left humerus fracture. NEUROLOGIC: Cranial nerves II through XII are intact. Muscle strength 5/5 in all extremities. Sensation intact. Gait not checked.  PSYCHIATRIC: The patient is alert and oriented x 3.  Normal affect and good eye contact. SKIN: No obvious rash, lesion, or ulcer.   LABORATORY PANEL:   CBC Recent Labs  Lab 09/01/23 1747   WBC 5.7  HGB 11.3*  HCT 34.4*  PLT 180   ------------------------------------------------------------------------------------------------------------------  Chemistries  Recent Labs  Lab 09/01/23 1747  NA 134*  K 3.8  CL 96*  CO2 20*  GLUCOSE 134*  BUN 45*  CREATININE 0.98  CALCIUM 9.6   ------------------------------------------------------------------------------------------------------------------  Cardiac Enzymes No results for input(s): "TROPONINI"  in the last 168 hours. ------------------------------------------------------------------------------------------------------------------  RADIOLOGY:  CT Cervical Spine Wo Contrast Result Date: 09/01/2023 CLINICAL DATA:  Neck trauma, fall EXAM: CT CERVICAL SPINE WITHOUT CONTRAST TECHNIQUE: Multidetector CT imaging of the cervical spine was performed without intravenous contrast. Multiplanar CT image reconstructions were also generated. RADIATION DOSE REDUCTION: This exam was performed according to the departmental dose-optimization program which includes automated exposure control, adjustment of the mA and/or kV according to patient size and/or use of iterative reconstruction technique. COMPARISON:  Plain films 10/17/2016 FINDINGS: Alignment: No subluxation Skull base and vertebrae: No acute fracture. No primary bone lesion or focal pathologic process. Soft tissues and spinal canal: No prevertebral fluid or swelling. No visible canal hematoma. Disc levels: Diffuse advanced degenerative disc disease and facet disease. Upper chest: No acute findings Other: None IMPRESSION: Advanced degenerative disc and facet disease diffusely. No acute bony abnormality. Electronically Signed   By: Charlett Nose M.D.   On: 09/01/2023 19:16   CT Head Wo Contrast Result Date: 09/01/2023 CLINICAL DATA:  Head trauma, minor (Age >= 65y).  Fall. EXAM: CT HEAD WITHOUT CONTRAST TECHNIQUE: Contiguous axial images were obtained from the base of the skull through  the vertex without intravenous contrast. RADIATION DOSE REDUCTION: This exam was performed according to the departmental dose-optimization program which includes automated exposure control, adjustment of the mA and/or kV according to patient size and/or use of iterative reconstruction technique. COMPARISON:  06/11/2022 FINDINGS: Brain: No acute intracranial abnormality. Specifically, no hemorrhage, hydrocephalus, mass lesion, acute infarction, or significant intracranial injury. Vascular: No hyperdense vessel or unexpected calcification. Skull: No acute calvarial abnormality. Sinuses/Orbits: No acute findings Other: None IMPRESSION: No acute intracranial abnormality. Electronically Signed   By: Charlett Nose M.D.   On: 09/01/2023 19:14   DG Humerus Right Result Date: 09/01/2023 CLINICAL DATA:  right upper arm pain after fall EXAM: RIGHT HUMERUS - 2+ VIEW COMPARISON:  X-ray 11/28/2017 FINDINGS: Acute obliquely oriented fracture through the mid right humeral diaphysis with approximately 1 cm of lateral displacement and mild shortening. A subtle nondisplaced fracture component is seen extending into the proximal diaphysis. Chronic severe arthropathy of the shoulder with marked osseous remodeling of the glenohumeral joint. High-riding humeral head with progressive osseous remodeling of the distal clavicle and acromion. Generalized soft tissue prominence of the upper arm. IMPRESSION: 1. Acute mildly displaced fracture of the mid right humeral diaphysis. 2. 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 Electronically Signed   By: Duanne Guess D.O.   On: 09/01/2023 18:45      IMPRESSION AND PLAN:  Assessment and Plan: * NSTEMI (non-ST elevated myocardial infarction) [facility]) - The patient will be admitted to a progressive unit bed. - Will follow serial troponins and EKGs. - We will continue IV heparin drip. - The patient will be placed on aspirin as well as p.r.n. sublingual nitroglycerin and morphine  sulfate for pain. - The patient will be placed on high-dose statin therapy and beta-blocker therapy - We will obtain a cardiology consult in a.m. for further cardiac risk stratification. - I notified [facility] group about the patient   Essential hypertension - We will continue antihypertensive therapy.  Dyslipidemia - The patient will be placed on high-dose statin therapy as mentioned above.  Hypothyroidism - We will continue Synthroid.  GERD without esophagitis - We will continue PPI therapy   DVT prophylaxis: IV heparin. Advanced Care Planning:  Code Status: The is DNR and DNI. Family Communication:  The plan of care was discussed in details with the  patient (and family). I answered all questions. The patient agreed to proceed with the above mentioned plan. Further management will depend upon hospital course. Disposition Plan: Back to previous home environment Consults called: Cardiology All the records are reviewed and case discussed with ED provider.  Status is: Inpatient  At the time of the admission, it appears that the appropriate admission status for this patient is inpatient.  This is judged to be reasonable and necessary in order to provide the required intensity of service to ensure the patient's safety given the presenting symptoms, physical exam findings and initial radiographic and laboratory data in the context of comorbid conditions.  The patient requires inpatient status due to high intensity of service, high risk of further deterioration and high frequency of surveillance required.  I certify that at the time of admission, it is my clinical judgment that the patient will require inpatient hospital care extending more than 2 midnights.                            Dispo: The patient is from: Home              Anticipated d/c is to: Home              Patient currently is not medically stable to d/c.              Difficult to place patient: No  Hannah Beat M.D on  09/01/2023 at 11:27 PM  [facility]   From 7 PM-7 AM, contact night-coverage [email or web address]  CC: Primary care physician; Doreene Nest, NP

## 2023-09-01 NOTE — Assessment & Plan Note (Addendum)
 No chest pain but troponin continued to get worse, last check was at 1260.  Cardiology is on board. Pending echocardiogram Patient was started on heparin infusion-significant decrease in hemoglobin and supratherapeutic APTT so it was discontinued. -Continue to trend troponin -Follow-up echocardiogram -Follow-up cardiology recommendations

## 2023-09-02 ENCOUNTER — Inpatient Hospital Stay
Admit: 2023-09-02 | Discharge: 2023-09-02 | Disposition: A | Discharge status: Home / Self Care | Payer: HMO | Attending: Family Medicine | Admitting: Family Medicine

## 2023-09-02 ENCOUNTER — Inpatient Hospital Stay: Payer: HMO

## 2023-09-02 ENCOUNTER — Encounter: Payer: Self-pay | Admitting: Family Medicine

## 2023-09-02 DIAGNOSIS — D62 Acute posthemorrhagic anemia: Secondary | ICD-10-CM | POA: Diagnosis not present

## 2023-09-02 DIAGNOSIS — E785 Hyperlipidemia, unspecified: Secondary | ICD-10-CM

## 2023-09-02 DIAGNOSIS — W19XXXA Unspecified fall, initial encounter: Secondary | ICD-10-CM

## 2023-09-02 DIAGNOSIS — K219 Gastro-esophageal reflux disease without esophagitis: Secondary | ICD-10-CM

## 2023-09-02 DIAGNOSIS — E039 Hypothyroidism, unspecified: Secondary | ICD-10-CM

## 2023-09-02 DIAGNOSIS — E872 Acidosis, unspecified: Secondary | ICD-10-CM

## 2023-09-02 DIAGNOSIS — S42341A Displaced spiral fracture of shaft of humerus, right arm, initial encounter for closed fracture: Secondary | ICD-10-CM | POA: Diagnosis not present

## 2023-09-02 DIAGNOSIS — I214 Non-ST elevation (NSTEMI) myocardial infarction: Secondary | ICD-10-CM

## 2023-09-02 DIAGNOSIS — I10 Essential (primary) hypertension: Secondary | ICD-10-CM

## 2023-09-02 DIAGNOSIS — S42301A Unspecified fracture of shaft of humerus, right arm, initial encounter for closed fracture: Secondary | ICD-10-CM

## 2023-09-02 HISTORY — DX: Acute posthemorrhagic anemia: D62

## 2023-09-02 LAB — APTT
aPTT: >200 s (ref 24–36)
aPTT: 110 s — ABNORMAL HIGH (ref 24–36)

## 2023-09-02 LAB — BASIC METABOLIC PANEL
Anion gap: 16 — ABNORMAL HIGH (ref 5–15)
BUN: 40 mg/dL — ABNORMAL HIGH (ref 8–23)
CO2: 16 mmol/L — ABNORMAL LOW (ref 22–32)
Calcium: 8.4 mg/dL — ABNORMAL LOW (ref 8.9–10.3)
Chloride: 99 mmol/L (ref 98–111)
Creatinine, Ser: 0.98 mg/dL (ref 0.44–1.00)
GFR, Estimated: 57 mL/min — ABNORMAL LOW (ref >60)
Glucose, Bld: 160 mg/dL — ABNORMAL HIGH (ref 70–99)
Potassium: 4.2 mmol/L (ref 3.5–5.1)
Sodium: 131 mmol/L — ABNORMAL LOW (ref 135–145)

## 2023-09-02 LAB — TROPONIN I (HIGH SENSITIVITY)
Troponin I (High Sensitivity): 1150 ng/L (ref <18)
Troponin I (High Sensitivity): 1260 ng/L (ref <18)

## 2023-09-02 LAB — CBC
HCT: 27.9 % — ABNORMAL LOW (ref 36.0–46.0)
Hemoglobin: 8.9 g/dL — ABNORMAL LOW (ref 12.0–15.0)
MCH: 32.5 pg (ref 26.0–34.0)
MCHC: 31.9 g/dL (ref 30.0–36.0)
MCV: 101.8 fL — ABNORMAL HIGH (ref 80.0–100.0)
Platelets: 188 10*3/uL (ref 150–400)
RBC: 2.74 MIL/uL — ABNORMAL LOW (ref 3.87–5.11)
RDW: 15.9 % — ABNORMAL HIGH (ref 11.5–15.5)
WBC: 9 10*3/uL (ref 4.0–10.5)
nRBC: 0 % (ref 0.0–0.2)

## 2023-09-02 LAB — FERRITIN: Ferritin: 23 ng/mL (ref 11–307)

## 2023-09-02 LAB — IRON AND TIBC
Iron: 86 ug/dL (ref 28–170)
Saturation Ratios: 23 % (ref 10.4–31.8)
TIBC: 371 ug/dL (ref 250–450)
UIBC: 285 ug/dL

## 2023-09-02 LAB — LIPID PANEL
Cholesterol: 144 mg/dL (ref 0–200)
HDL: 68 mg/dL (ref >40)
LDL Cholesterol: 65 mg/dL (ref 0–99)
Total CHOL/HDL Ratio: 2.1 {ratio}
Triglycerides: 57 mg/dL (ref <150)
VLDL: 11 mg/dL (ref 0–40)

## 2023-09-02 LAB — RETICULOCYTES
Immature Retic Fract: 32.9 % — ABNORMAL HIGH (ref 2.3–15.9)
RBC.: 2.5 MIL/uL — ABNORMAL LOW (ref 3.87–5.11)
Retic Count, Absolute: 80.5 10*3/uL (ref 19.0–186.0)
Retic Ct Pct: 3.2 % — ABNORMAL HIGH (ref 0.4–3.1)

## 2023-09-02 LAB — HEMOGLOBIN A1C
Hgb A1c MFr Bld: 5.5 % (ref 4.8–5.6)
Mean Plasma Glucose: 111.15 mg/dL

## 2023-09-02 LAB — FOLATE: Folate: >40 ng/mL (ref >5.9)

## 2023-09-02 LAB — LACTIC ACID, PLASMA
Lactic Acid, Venous: 2.4 mmol/L (ref 0.5–1.9)
Lactic Acid, Venous: 3.8 mmol/L (ref 0.5–1.9)

## 2023-09-02 LAB — PROTIME-INR
INR: 1.4 — ABNORMAL HIGH (ref 0.8–1.2)
Prothrombin Time: 16.9 s — ABNORMAL HIGH (ref 11.4–15.2)

## 2023-09-02 LAB — HEMOGLOBIN AND HEMATOCRIT, BLOOD
HCT: 25.2 % — ABNORMAL LOW (ref 36.0–46.0)
HCT: 27.7 % — ABNORMAL LOW (ref 36.0–46.0)
Hemoglobin: 8.2 g/dL — ABNORMAL LOW (ref 12.0–15.0)
Hemoglobin: 9.5 g/dL — ABNORMAL LOW (ref 12.0–15.0)

## 2023-09-02 LAB — VITAMIN B12: Vitamin B-12: 1621 pg/mL — ABNORMAL HIGH (ref 180–914)

## 2023-09-02 LAB — HEPARIN LEVEL (UNFRACTIONATED): Heparin Unfractionated: >1.1 [IU]/mL — ABNORMAL HIGH (ref 0.30–0.70)

## 2023-09-02 LAB — PREPARE RBC (CROSSMATCH)

## 2023-09-02 MED ORDER — HEPARIN (PORCINE) 25000 UT/250ML-% IV SOLN
550.0000 [IU]/h | INTRAVENOUS | Status: DC
  Filled 2023-09-02: qty 250

## 2023-09-02 MED ORDER — GABAPENTIN 100 MG PO CAPS
100.0000 mg | ORAL_CAPSULE | Freq: Three times a day (TID) | ORAL | Status: DC
  Administered 2023-09-02 – 2023-09-14 (×33): 100 mg via ORAL
  Filled 2023-09-02 (×38): qty 1

## 2023-09-02 MED ORDER — LACTATED RINGERS IV SOLN: INTRAVENOUS | Status: AC

## 2023-09-02 MED ORDER — SODIUM CHLORIDE 0.9% IV SOLUTION: Freq: Once | INTRAVENOUS | Status: AC

## 2023-09-02 MED ORDER — SODIUM CHLORIDE 0.9 % IV SOLN
12.5000 mg | Freq: Four times a day (QID) | INTRAVENOUS | Status: DC | PRN
  Administered 2023-09-02: 12.5 mg via INTRAVENOUS
  Filled 2023-09-02: qty 12.5

## 2023-09-02 MED ORDER — SIMVASTATIN 20 MG PO TABS
40.0000 mg | ORAL_TABLET | Freq: Every day | ORAL | Status: DC
  Administered 2023-09-02 – 2023-09-18 (×17): 40 mg via ORAL
  Filled 2023-09-02 (×17): qty 2

## 2023-09-02 MED ORDER — LACTATED RINGERS IV BOLUS: 1000.0000 mL | Freq: Once | INTRAVENOUS | Status: DC

## 2023-09-02 MED ORDER — HYDROCODONE-ACETAMINOPHEN 5-325 MG PO TABS
1.0000 | ORAL_TABLET | Freq: Four times a day (QID) | ORAL | Status: DC | PRN
  Administered 2023-09-02 – 2023-09-19 (×34): 1 via ORAL
  Filled 2023-09-02 (×35): qty 1

## 2023-09-02 MED ORDER — ORAL CARE MOUTH RINSE: 15.0000 mL | OROMUCOSAL | Status: DC | PRN

## 2023-09-02 MED ORDER — GABAPENTIN 100 MG PO CAPS: 100.0000 mg | ORAL_CAPSULE | Freq: Three times a day (TID) | ORAL | Status: DC

## 2023-09-02 NOTE — Consult Note (Addendum)
 Cardiology Consult    Patient ID: Kathleen Paul MRN: [id number], DOB/AGE: 07/10/1936   Admit date: 09/01/2023 Date of Consult: 09/02/2023  Primary Physician: Doreene Nest, NP Primary Cardiologist: Dietrich Pates, MD Requesting Provider: Andrez Grime, MD  Patient Profile    Kathleen Paul is a 88 y.o. female with a history of nonobstructive CAD, HTN, HL, diastolic dysfxn, PSVT, TIA, 1st deg AVB, intermittent mobitz 1 HB, RBBB, anxiety, OA, diverticuosis, esophageal stricture, gastric ulcer, and remote h/o DVT, who is being seen today for the evaluation of elevated troponin in the setting of fall w/ R humeral fx at the request of Dr. Arville Care.  Past Medical History  Subjective  Past Medical History:  Diagnosis Date   1st degree AV block    Anxiety state, unspecified    Arthritis    Atherosclerosis    Cancer (HCC)    skin cancer   Cellulitis 10/20/2022   Diastolic dysfunction    Diverticulosis    Esophageal stricture    Essential hypertension, benign    Expressive aphasia 06/12/2022   Gastric ulcer    Hiatal hernia    History of DVT (deep vein thrombosis) yrs ago   legs   IDA (iron deficiency anemia)    Inguinal hernia without mention of obstruction or gangrene, unilateral or unspecified, (not specified as recurrent)    2014   Internal hemorrhoids    Mild mitral regurgitation    a. 04/2010 Echo: nl EF, mild diast dysfxn, trace PR, mild TR, mild-mod MR; b. 06/2022 Echo: EF 50-55%, GrI DD, nl RV fxn, triv MR, mild AI.   Mobitz I    Non-obstructive CAD    a. 04/2010 Myoview: EF 77%, fixed apical/inferior defect;  b. 04/2010 Cath: LM nl, LAD 67m, LCX/RCA minor irregs, EF 60%;  c. 04/2011 Myoview: low risk.   Overweight (BMI 25.0-29.9) 06/04/2019   PSVT (paroxysmal supraventricular tachycardia) (HCC)    a. 06/2022 Zio: predominantly sinus rhythm, 1st deg avb, intermittnet Mobitz I, 108 SVT runs (longest 36m 54s, fastest 176). 14.8% PAC burden. Rare PVCs.   Pure  hypercholesterolemia    Rupture of left patellar tendon    05-26-2019   Scoliosis    Sepsis (HCC) 10/21/2022   Status post total left knee replacement 02/11/2019   Suprapubic mass 08/20/2012   TIA (transient ischemic attack)    UI (urinary incontinence)    Umbilical hernia without mention of obstruction or gangrene    2014   Unspecified hypothyroidism    Vitamin D deficiency     Past Surgical History:  Procedure Laterality Date   ABDOMINAL HYSTERECTOMY  2008   complete    BIOPSY  04/02/2018   Procedure: BIOPSY;  Surgeon: Lemar Lofty., MD;  Location: [facility];  Service: Gastroenterology;;   CARDIAC CATHETERIZATION  2011   [facility]   COLONOSCOPY  2003   [facility]   ESOPHAGOGASTRODUODENOSCOPY (EGD) WITH PROPOFOL N/A 04/02/2018   Procedure: ESOPHAGOGASTRODUODENOSCOPY (EGD) WITH PROPOFOL;  Surgeon: Lemar Lofty., MD;  Location: [facility];  Service: Gastroenterology;  Laterality: N/A;   FOREIGN BODY REMOVAL  04/02/2018   Procedure: FOREIGN BODY REMOVAL;  Surgeon: Meridee Score Netty Starring., MD;  Location: [facility];  Service: Gastroenterology;;   HERNIA REPAIR Right 09/23/2012   repair RIH   LAPAROSCOPIC HYSTERECTOMY     PATELLAR TENDON REPAIR Left 06/03/2019   Procedure: LEFT PATELLA TENDON REPAIR, RIGHT KNEE INJECTION.;  Surgeon: Durene Romans, MD;  Location: [facility];  Service: Orthopedics;  Laterality: Left;  PATELLAR TENDON REPAIR Left 07/01/2019   Procedure: OPEN PATELLA TENDON REPAIR;  Surgeon: Durene Romans, MD;  Location: [facility];  Service: Orthopedics;  Laterality: Left;   TONSILLECTOMY     TOTAL KNEE ARTHROPLASTY Left 02/11/2019   Procedure: TOTAL KNEE ARTHROPLASTY;  Surgeon: Durene Romans, MD;  Location: [facility];  Service: Orthopedics;  Laterality: Left;  70 mins     Allergies  Allergies  Allergen Reactions   Codeine Nausea Only      History of Present Illness   88 y.o. female with a history of nonobstructive CAD, HTN, HL, diastolic  dysfxn, PSVT, TIA, 1st deg AVB, intermittent mobitz 1 HB, RBBB, anxiety, OA, diverticuosis, esophageal stricture, gastric ulcer, and remote h/o DVT.  She prev underwent cath in 10.2011, revealing a 50% mLAD stenosis and otw minor irregularities and nl LV fxn.  In 06/2022, she was admitted w/ aphasia, which resolved spontaneously.  CT/MRI/A w/o acute stroke.  Echo showed nl LV fxn w/ Gr I DD and w/o significant valvular dzs.  She was placed on asa/plavix.  Subsequent zio monitoring showed predominantly sinus rhythm w/ 108 SVT runs, lasting up to 11 mins, along w/ a 14.8% PAC burden. No afib/flutter noted, and she was managed medically.  She was doing well @ cardiology f/u in 05/2023.  Patient lives locally with her daughter.  She has an unsteady gait and uses a walker for ambulation.  She does not typically fall.  Activity is very limited though she denies any prior history of chest pain or dyspnea with usual activities around her home.  She was in her usual state of health on February 22, when she was using her walker while returning from the bathroom and her right knee buckled, causing her to fall forward.  Her walker came out from underneath her and she fell onto her right side.  She immediately noted right shoulder and arm pain and called her son.  She was not presyncopal and did not lose consciousness.  She was taken to the emergency department where on presentation, she was afebrile, hypertensive (149/82), and bradycardic w/ initial documented HRs in the high 30's to 40's, though  ECG showed sinus rhythm @ 67, PVC, PACs, inf infarct, and subtle inferolateral ST elevation.  She denied chest pain or dyspnea.  Troponin was evaluated and was initially 25 with repeat value of 410.  H/H 11.3/34.4, BUN/Creat 45/0.98.  R arm films w/ acute mildly displaced fx of mid R humeral diaphysis and chronic sev R shoulder arthropathy.  CT head/cervical spine w/o acute abnormalities.  She was treated w/ ASA, heparin, narcotics  for pain mgmt.  This AM H/H down to 8.9/27.9.  INR 1.4.  Patient continues to deny chest pain or dyspnea though continues to complain of right arm and shoulder pain.  She has also been having some nausea requiring Zofran.  She denies melena or bright red blood per rectum.  Family at bedside.  Troponin 1,150 this morning.  Inpatient Medications  Subjective    aspirin EC  81 mg Oral Daily   levothyroxine  88 mcg Oral Q0600   multivitamin with minerals  1 tablet Oral Daily   pantoprazole  40 mg Oral Daily   simvastatin  40 mg Oral q1800    Family History    Family History  Problem Relation Age of Onset   Heart disease Mother    Heart attack Mother    Heart disease Father    Heart attack Father    Breast cancer  Sister    Breast cancer Maternal Aunt    Heart disease Brother        sister   She indicated that her mother is deceased. She indicated that her father is deceased. She indicated that both of her sisters are deceased. She indicated that only one of her two brothers is alive. She indicated that her maternal grandmother is deceased. She indicated that her maternal grandfather is deceased. She indicated that her paternal grandmother is deceased. She indicated that her paternal grandfather is deceased. She indicated that the status of her maternal aunt is unknown.   Social History    Social History   Socioeconomic History   Marital status: Widowed    Spouse name: Not on file   Number of children: Not on file   Years of education: Not on file   Highest education level: Not on file  Occupational History   Not on file  Tobacco Use   Smoking status: Former    Current packs/day: 0.00    Average packs/day: 0.5 packs/day for 30.0 years (15.0 ttl pk-yrs)    Types: Cigarettes    Start date: 07/10/1958    Quit date: 07/10/1988    Years since quitting: 35.1   Smokeless tobacco: Never  Vaping Use   Vaping status: Never Used  Substance and Sexual Activity   Alcohol use: No   Drug  use: Never   Sexual activity: Not on file  Other Topics Concern   Not on file  Social History Narrative   Lives locally with daughter.  Unsteady gait in the setting of severe lower extremity osteoarthritis-uses walker for ambulation.  Does not routinely exercise.   Social Drivers of Corporate investment banker Strain: Low Risk  (12/18/2022)   Overall Financial Resource Strain (CARDIA)    Difficulty of Paying Living Expenses: Not hard at all  Food Insecurity: No Food Insecurity (09/02/2023)   Hunger Vital Sign    Worried About Running Out of Food in the Last Year: Never true    Ran Out of Food in the Last Year: Never true  Transportation Needs: No Transportation Needs (09/02/2023)   PRAPARE - Administrator, Civil Service (Medical): No    Lack of Transportation (Non-Medical): No  Physical Activity: Inactive (12/18/2022)   Exercise Vital Sign    Days of Exercise per Week: 0 days    Minutes of Exercise per Session: 0 min  Stress: No Stress Concern Present (12/18/2022)   [employer] of Occupational Health - Occupational Stress Questionnaire    Feeling of Stress : Not at all  Social Connections: Socially Isolated (09/02/2023)   Social Connection and Isolation Panel [NHANES]    Frequency of Communication with Friends and Family: More than three times a week    Frequency of Social Gatherings with Friends and Family: More than three times a week    Attends Religious Services: Never    Database administrator or Organizations: No    Attends Banker Meetings: Never    Marital Status: Widowed  Intimate Partner Violence: Not At Risk (09/02/2023)   Humiliation, Afraid, Rape, and Kick questionnaire    Fear of Current or Ex-Partner: No    Emotionally Abused: No    Physically Abused: No    Sexually Abused: No     Review of Systems    General:  No chills, fever, night sweats or weight changes.  Cardiovascular:  No chest pain, dyspnea on exertion, edema, orthopnea,  palpitations,  paroxysmal nocturnal dyspnea. Dermatological: No rash, lesions/masses Respiratory: No cough, dyspnea Urologic: No hematuria, dysuria Abdominal:   +++ nausea, no vomiting, diarrhea, bright red blood per rectum, melena, or hematemesis Neurologic:  No visual changes, wkns, changes in mental status. MSK: Chronic unsteady gait though no prior history of falls until September 01, 2023.  Currently with right shoulder and arm pain. All other systems reviewed and are otherwise negative except as noted above.    Objective  Physical Exam    Blood pressure (!) 95/37, pulse 62, temperature 98 F (36.7 C), resp. rate 16, height 5' (1.524 m), weight 62.6 kg, SpO2 97%.  General: Pleasant, NAD Psych: Normal affect. Neuro: Alert and oriented X 3. Moves all extremities spontaneously. HEENT: Normal  Neck: Supple without bruits or JVD. Lungs:  Resp regular and unlabored, CTA. Heart: Irregular with frequent ectopy, 2/6 systolic murmur at the left lower sternal border to the apex. Abdomen: Soft, non-tender, non-distended, BS + x 4.  Extremities: No clubbing, cyanosis or edema. DP/PT2+, Radials 2+ and equal bilaterally.  Right arm is wrapped.  Right shoulder is bruised.  Left knee bruising noted.  Labs    Cardiac Enzymes Recent Labs  Lab 09/01/23 1747 09/01/23 2030 09/02/23 0621  TROPONINIHS 25* 410* 1,150*      Lab Results  Component Value Date   WBC 9.0 09/02/2023   HGB 8.9 (L) 09/02/2023   HCT 27.9 (L) 09/02/2023   MCV 101.8 (H) 09/02/2023   PLT 188 09/02/2023    Recent Labs  Lab 09/02/23 0622  NA 131*  K 4.2  CL 99  CO2 16*  BUN 40*  CREATININE 0.98  CALCIUM 8.4*  GLUCOSE 160*   Lab Results  Component Value Date   CHOL 144 09/02/2023   HDL 68 09/02/2023   LDLCALC 65 09/02/2023   TRIG 57 09/02/2023   Lab Results  Component Value Date   DDIMER 8.67 (H) 10/20/2022      Radiology Studies    CT Cervical Spine Wo Contrast Result Date: 09/01/2023 CLINICAL  DATA:  Neck trauma, fall EXAM: CT CERVICAL SPINE WITHOUT CONTRAST TECHNIQUE: Multidetector CT imaging of the cervical spine was performed without intravenous contrast. Multiplanar CT image reconstructions were also generated. RADIATION DOSE REDUCTION: This exam was performed according to the departmental dose-optimization program which includes automated exposure control, adjustment of the mA and/or kV according to patient size and/or use of iterative reconstruction technique. COMPARISON:  Plain films 10/17/2016 FINDINGS: Alignment: No subluxation Skull base and vertebrae: No acute fracture. No primary bone lesion or focal pathologic process. Soft tissues and spinal canal: No prevertebral fluid or swelling. No visible canal hematoma. Disc levels: Diffuse advanced degenerative disc disease and facet disease. Upper chest: No acute findings Other: None IMPRESSION: Advanced degenerative disc and facet disease diffusely. No acute bony abnormality. Electronically Signed   By: Charlett Nose M.D.   On: 09/01/2023 19:16   CT Head Wo Contrast Result Date: 09/01/2023 CLINICAL DATA:  Head trauma, minor (Age >= 65y).  Fall. EXAM: CT HEAD WITHOUT CONTRAST TECHNIQUE: Contiguous axial images were obtained from the base of the skull through the vertex without intravenous contrast. RADIATION DOSE REDUCTION: This exam was performed according to the departmental dose-optimization program which includes automated exposure control, adjustment of the mA and/or kV according to patient size and/or use of iterative reconstruction technique. COMPARISON:  06/11/2022 FINDINGS: Brain: No acute intracranial abnormality. Specifically, no hemorrhage, hydrocephalus, mass lesion, acute infarction, or significant intracranial injury. Vascular: No hyperdense vessel or unexpected  calcification. Skull: No acute calvarial abnormality. Sinuses/Orbits: No acute findings Other: None IMPRESSION: No acute intracranial abnormality. Electronically Signed   By:  Charlett Nose M.D.   On: 09/01/2023 19:14   DG Humerus Right Result Date: 09/01/2023 CLINICAL DATA:  right upper arm pain after fall EXAM: RIGHT HUMERUS - 2+ VIEW COMPARISON:  X-ray 11/28/2017 FINDINGS: Acute obliquely oriented fracture through the mid right humeral diaphysis with approximately 1 cm of lateral displacement and mild shortening. A subtle nondisplaced fracture component is seen extending into the proximal diaphysis. Chronic severe arthropathy of the shoulder with marked osseous remodeling of the glenohumeral joint. High-riding humeral head with progressive osseous remodeling of the distal clavicle and acromion. Generalized soft tissue prominence of the upper arm. IMPRESSION: 1. Acute mildly displaced fracture of the mid right humeral diaphysis. 2. 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 Electronically Signed   By: Duanne Guess D.O.   On: 09/01/2023 18:45      ECG & Cardiac Imaging    sinus rhythm @ 67, PVC, PACs, inf infarct, and subtle inferolateral ST elevation - personally reviewed.  Assessment & Plan    1.  Demand ischemia/CAD: Patient with prior history of 50% mid LAD stenosis on cardiac catheterization in 2011.  She presented February 22nd 2025 following fall, when her knee buckled, causing her to fall forward and striking her right shoulder resulting in shoulder pain and right humeral fracture.  ECG showed sinus rhythm with PVC, PACs, inferior infarct, and subtle inferolateral ST elevation.  Patient was not having chest pain or dyspnea.  Initial troponin was 45 but subsequently rose to 410 and value this morning of 1150.  Patient continues to deny chest pain or dyspnea.  She is hemodynamically stable.  Heparin is currently running.  H&H slightly lower this morning at 8.9 and 27.9.  No history of GI bleeding.  Trend troponins to peak.  Follow-up 2D echocardiogram to evaluate LV function rule out wall motion abnormalities.  Continue aspirin and statin.  No beta-blocker in  the setting of prior history of intermittent bradycardia.  Follow-up H&H at noon today and if lower, stop heparin and consider transfusion.  Further recommendations pending echo.  2.  Right humeral fracture: Ortho to see.  Having significant pain.  Pain management per medicine team.  3.  Primary hypertension: Pressure currently stable to soft.  Follow on enalapril.  4.  Hyperlipidemia: On simvastatin as an outpatient with an LDL of 65 this morning.  5.  Anemia: H&H down to 8.9 and 27.9 following admission and heparinization.  Follow-up H&H at noon.  CT imaging of arm/abd/pelvis per medicine team to assess for bleeding.  Consider discontinuation of heparin for further drop in blood counts.   Risk Assessment/Risk Scores:     TIMI Risk Score for Unstable Angina or Non-ST Elevation MI:   The patient's TIMI risk score is 5, which indicates a 26% risk of all cause mortality, new or recurrent myocardial infarction or need for urgent revascularization in the next 14 days.      Signed, Nicolasa Ducking, NP 09/02/2023, 9:50 AM  For questions or updates, please contact   Please consult [email or web address] for contact info under Cardiology/STEMI.

## 2023-09-02 NOTE — ED Notes (Signed)
 Pt Kathleen Paul reports injury to left arm post fall from standing pt reports previous knee surgery unsuccessful resulting in knee "giving out" occasionally pt reports "knee gave out" and pt fell injuring left upper arm causing humeral fx, noted pt denies CP, denies SOB

## 2023-09-02 NOTE — Progress Notes (Signed)
 Was getting ready to start the echo on the patient at 10:20am when nurse Asher Muir) for patient told me to stop and they needed to take the patient to CT. I let the nurse know I needed to do the test now and was unable to come back to do the ultrasound later. She said it was fine  the patient needed to go to CT. She then preceded to  be rude about it and said she would document that I wasn't going to do it.  Will do echo tomorrow.

## 2023-09-02 NOTE — Hospital Course (Addendum)
 87yo with h/o anxiety, gastric ulcer, HLD, DVT, HTN, and CAD who presented on 2/22 with a mechanical fall.  She was found to have a R humerus fracture and orthopedics was consulted.  She was also noted to have elevated troponin (25 -> 410 -> 1431, now downtrending) and cardiology was consulted, started heparin.  Developed ABLA with CT showing concern for humerus bleeding and heparin was stopped.  Transfused 3 units PRBC.  Echo without WMA, cardiology recommended conservative management.

## 2023-09-02 NOTE — Plan of Care (Signed)

## 2023-09-02 NOTE — Assessment & Plan Note (Signed)
 Worsening acidosis, likely with NSTEMI and fall resulted in right humeral fracture. -Giving some IV fluid -Monitor lactic acid

## 2023-09-02 NOTE — ED Notes (Signed)
 Pt denies CP denies SOB

## 2023-09-02 NOTE — Progress Notes (Addendum)
 Progress Note   Patient: Kathleen Paul ZOX:[id number] DOB: [birth year] DOA: 09/01/2023     1 DOS: the patient was seen and examined on 09/02/2023   Brief hospital course: Taken from H&P.  Kathleen Paul is a 88 y.o. Caucasian female with medical history significant for anxiety, osteoarthritis, diverticulosis, gastric ulcer, history of DVT, dyslipidemia, essential hypertension and nonobstructive coronary artery disease, who presented to the emergency room with acute onset of fall while ambulating with her walker at home after using the bathroom.  She hit her head.  She believes that her knee gave out on her.  No presyncope or syncope.   On presentation patient has borderline bradycardia at 46, blood pressure 149/82, labs with mild hyponatremia at 134 , CO2 of 20, BUN 45, anion gap of 18.  Troponin 25>410.  Coagulation profile was normal.  UA was unremarkable. EKG with NSR, multiple PVCs, right bundle branch block and inferior Q waves CT head with no acute intracranial abnormalities, CT C-spine with advanced degenerative disc and facet disease diffusely with no acute bony abnormality. Right humerus x-ray with acute mildly displaced fracture of the mid right humeral diaphysis, severe chronic arthropathy of the right shoulder.  Cardiology and orthopedic surgery was consulted.,  Started on heparin infusion.  2/23: Vital stable with borderline soft blood pressure at 95/37, labs with significant increase in APTT to above 200, hemoglobin decreased to 8.9 from 11.3 yesterday, BMP with slight worsening of hyponatremia at 131, CO2 16, BUN 40, anion gap of 16, lipid panel normal. Starting on LR, UA with 20 ketones, lactic acid of 2.4. Repeat hemoglobin came back at 8.2.  CT of right upper extremity and abdomen was obtained as she was having abdominal pain to rule out any concealed bleeding, CT abdomen was without any acute abnormality but CT of right humerus with concern of bleeding.  Discussed with  orthopedic surgery and they were just recommending monitoring and blood transfusion with the hope that bleeding will stop.  They are recommending nonsurgical conservative management.  Also discussed with cardiology and heparin infusion was stopped.  Troponin continued to get worse. Ordered 1 unit of PRBC.    Assessment and Plan: * NSTEMI (non-ST elevated myocardial infarction) (HCC) No chest pain but troponin continued to get worse, last check was at 1260.  Cardiology is on board. Pending echocardiogram Patient was started on heparin infusion-significant decrease in hemoglobin and supratherapeutic APTT so it was discontinued. -Continue to trend troponin -Follow-up echocardiogram -Follow-up cardiology recommendations   Fall Closed right humeral fracture. The patient has subsequent right humerus fracture. Orthopedic is recommending conservative management.  Sling was placed.  Concern of bleeding around the fracture site. -Continue with pain management -Continue to monitor for any compartment syndrome   Acute blood loss anemia (ABLA) Significant decrease in hemoglobin this morning. Patient was on heparin infusion for supratherapeutic APTT for concern of NSTEMI.  CT abdomen and right humerus was obtained due to worsening right upper extremity swelling and abdominal pain. -CT abdomen was negative for any acute abnormality -Right humeral CT with concern of bleeding around fracture site -Heparin was held -Ordered 1 unit of PRBC -Anemia panel with no significant deficiency, B12 pending -Monitor hemoglobin -Keep above 8 due to concern of NSTEMI  Essential hypertension Blood pressure little soft.  Patient also had some intermittent bradycardia Concern of NSTEMI and bleeding. -Giving some IV fluid -Holding home metoprolol  Lactic acidosis Worsening acidosis, likely with NSTEMI and fall resulted in right humeral fracture. -Giving some IV  fluid -Monitor lactic acid  Hypothyroidism -  We will continue Synthroid.  Dyslipidemia Lipid profile normal. -Continuing home Zocor  GERD without esophagitis - We will continue PPI therapy   Subjective: Patient was complaining of right upper extremity tingling and numbness and significant abdominal pain.  No nausea or vomiting.  Not sure when was her last BM.  Physical Exam: Vitals:   09/02/23 0400 09/02/23 0802 09/02/23 0947 09/02/23 1255  BP: (!) 147/94 (!) 95/37 (!) 95/37 98/80  Pulse: 77 62    Resp: 18 16  16   Temp: 98 F (36.7 C) 98 F (36.7 C)    TempSrc: Oral     SpO2: 96% 97%  98%  Weight:      Height:       General.  Frail elderly lady, in no acute distress. Pulmonary.  Lungs clear bilaterally, normal respiratory effort. CV.  Regular rate and rhythm, no JVD, rub or murmur. Abdomen.  Soft, nontender, nondistended, BS positive. CNS.  Alert and oriented .  No focal neurologic deficit. Extremities.  No edema, no cyanosis, pulses intact and symmetrical. Psychiatry.  Judgment and insight appears normal.   Data Reviewed: Prior data reviewed  Family Communication: Discussed with son and DIL at bedside  Disposition: Status is: Inpatient Remains inpatient appropriate because: Severity of illness  Planned Discharge Destination:  To be determined  Time spent: 50 minutes  This record has been created using Conservation officer, historic buildings. Errors have been sought and corrected,but may not always be located. Such creation errors do not reflect on the standard of care.   Author: Arnetha Courser, MD 09/02/2023 2:33 PM  For on call review [email or web address].

## 2023-09-02 NOTE — Plan of Care (Signed)
  Problem: Education: Goal: Knowledge of General Education information will improve Description Including pain rating scale, medication(s)/side effects and non-pharmacologic comfort measures Outcome: Progressing   Problem: Health Behavior/Discharge Planning: Goal: Ability to manage health-related needs will improve Outcome: Progressing   Problem: Clinical Measurements: Goal: Cardiovascular complication will be avoided Outcome: Progressing   Problem: Nutrition: Goal: Adequate nutrition will be maintained Outcome: Progressing   

## 2023-09-02 NOTE — Assessment & Plan Note (Signed)
 Significant decrease in hemoglobin this morning. Patient was on heparin infusion for supratherapeutic APTT for concern of NSTEMI.  CT abdomen and right humerus was obtained due to worsening right upper extremity swelling and abdominal pain. -CT abdomen was negative for any acute abnormality -Right humeral CT with concern of bleeding around fracture site -Heparin was held -Ordered 1 unit of PRBC -Anemia panel with no significant deficiency, B12 pending -Monitor hemoglobin -Keep above 8 due to concern of NSTEMI

## 2023-09-02 NOTE — Progress Notes (Signed)
 PHARMACY - ANTICOAGULATION CONSULT NOTE  Pharmacy Consult for Heparin Infusion Indication: chest pain/ACS  Patient Measurements: Height: 5' (152.4 cm) Weight: 62.6 kg (138 lb) IBW/kg (Calculated) : 45.5 Heparin Dosing Weight: 58.6 kg  Labs: Recent Labs    09/01/23 1747 09/01/23 2030 09/01/23 2129 09/02/23 0621 09/02/23 0622  HGB 11.3*  --   --   --  8.9*  HCT 34.4*  --   --   --  27.9*  PLT 180  --   --   --  188  APTT  --   --  30 >200*  --   LABPROT  --   --  14.1  --  16.9*  INR  --   --  1.1  --  1.4*  HEPARINUNFRC  --   --   --   --  >1.10*  CREATININE 0.98  --   --   --  0.98  TROPONINIHS 25* 410*  --   --   --     Estimated Creatinine Clearance: 34.7 mL/min (by C-G formula based on SCr of 0.98 mg/dL).   Medical History: Past Medical History:  Diagnosis Date   1st degree AV block    Anxiety state, unspecified    Arthritis    Atherosclerosis    Cancer (HCC)    skin cancer   Cellulitis 10/20/2022   Diastolic dysfunction    Diverticulosis    Esophageal stricture    Essential hypertension, benign    Expressive aphasia 06/12/2022   Gastric ulcer    Hiatal hernia    History of DVT (deep vein thrombosis) yrs ago   legs   IDA (iron deficiency anemia)    Inguinal hernia without mention of obstruction or gangrene, unilateral or unspecified, (not specified as recurrent)    2014   Internal hemorrhoids    Mild mitral regurgitation    a. 04/2010 Echo: nl EF, mild diast dysfxn, trace PR, mild TR, mild-mod MR; b. 06/2022 Echo: EF 50-55%, GrI DD, nl RV fxn, triv MR, mild AI.   Mobitz I    Non-obstructive CAD    a. 04/2010 Myoview: EF 77%, fixed apical/inferior defect;  b. 04/2010 Cath: LM nl, LAD 51m, LCX/RCA minor irregs, EF 60%;  c. 04/2011 Myoview: low risk.   Overweight (BMI 25.0-29.9) 06/04/2019   PSVT (paroxysmal supraventricular tachycardia) (HCC)    a. 06/2022 Zio: predominantly sinus rhythm, 1st deg avb, intermittnet Mobitz I, 108 SVT runs (longest 51m  54s, fastest 176). 14.8% PAC burden. Rare PVCs.   Pure hypercholesterolemia    Rupture of left patellar tendon    05-26-2019   Scoliosis    Sepsis (HCC) 10/21/2022   Status post total left knee replacement 02/11/2019   Suprapubic mass 08/20/2012   TIA (transient ischemic attack)    UI (urinary incontinence)    Umbilical hernia without mention of obstruction or gangrene    2014   Unspecified hypothyroidism    Vitamin D deficiency     Medications:  No anticoagulation use at home  Assessment: Patient is a 88 year old female who presents with an NSTEMI. Troponin 25>410. Pharmacy has been consulted to initiate patient on a heparin infusion.   Baseline INR 1.1 and aPTT 30s  No signs/symptoms of bleeding noted. Hgb 11.3. PLT 180.  0223 0622 HL > 1.1, suprathera; 700 un/hr  Goal of Therapy:  Heparin level 0.3-0.7 units/ml Monitor platelets by anticoagulation protocol: Yes   Plan:  --Heparin level is supratherapeutic. Will hold infusion x 1 hour and then  re-start at decreased rate of 550 units/hr --Re-check heparin level 8 hours from rate change --Daily CBC per protocol while on IV heparin  Tressie Ellis 09/02/2023,8:29 AM

## 2023-09-02 NOTE — Consult Note (Addendum)
 ORTHOPAEDIC CONSULTATION  REQUESTING PHYSICIAN: Arnetha Courser, MD  Chief Complaint: Right arm pain  HPI: Kathleen Paul is a 88 y.o. female who complains of right arm pain after a fall at home last night.  She was using a walker and fell.  She was brought to the emergency room where exam and x-rays revealed a spiral fracture of the right humeral midshaft.  In addition she has noted to have extremity severe arthropathy of the right shoulder joint with almost complete erosion of the distal clavicle and notching of the humeral neck by the glenoid.  She has significant osteopenia.  She was treated with a coaptation type splint for the humerus.  Her hemoglobin has dropped somewhat to 8.9.    She has had some complaints of numbness in the hand today and I rewrapped her splints which improved the situation.  We can expect her to have some bleeding and swelling distally over the next few days.  Her daughter is present as well.  I have encouraged them to have her try and make a fist and squeeze the muscles of the arm as much as possible.  Due to her cardiac issues she is a nonoperative candidate at this time.  Most of these fractures do heal without surgery.  No other acute injuries are noted orthopedically.  She does have chronic problems with the left knee following knee replacement in 2020 and subsequent patellar tendon rupture and repair on 2 occasions.  This renders her left leg somewhat unstable.  Past Medical History:  Diagnosis Date   1st degree AV block    Anxiety state, unspecified    Arthritis    Atherosclerosis    Cancer (HCC)    skin cancer   Cellulitis 10/20/2022   Diastolic dysfunction    Diverticulosis    Esophageal stricture    Essential hypertension, benign    Expressive aphasia 06/12/2022   Gastric ulcer    Hiatal hernia    History of DVT (deep vein thrombosis) yrs ago   legs   IDA (iron deficiency anemia)    Inguinal hernia without mention of obstruction or gangrene,  unilateral or unspecified, (not specified as recurrent)    2014   Internal hemorrhoids    Mild mitral regurgitation    a. 04/2010 Echo: nl EF, mild diast dysfxn, trace PR, mild TR, mild-mod MR; b. 06/2022 Echo: EF 50-55%, GrI DD, nl RV fxn, triv MR, mild AI.   Mobitz I    Non-obstructive CAD    a. 04/2010 Myoview: EF 77%, fixed apical/inferior defect;  b. 04/2010 Cath: LM nl, LAD 32m, LCX/RCA minor irregs, EF 60%;  c. 04/2011 Myoview: low risk.   Overweight (BMI 25.0-29.9) 06/04/2019   PSVT (paroxysmal supraventricular tachycardia) (HCC)    a. 06/2022 Zio: predominantly sinus rhythm, 1st deg avb, intermittnet Mobitz I, 108 SVT runs (longest 26m 54s, fastest 176). 14.8% PAC burden. Rare PVCs.   Pure hypercholesterolemia    Rupture of left patellar tendon    05-26-2019   Scoliosis    Sepsis (HCC) 10/21/2022   Status post total left knee replacement 02/11/2019   Suprapubic mass 08/20/2012   TIA (transient ischemic attack)    UI (urinary incontinence)    Umbilical hernia without mention of obstruction or gangrene    2014   Unspecified hypothyroidism    Vitamin D deficiency    Past Surgical History:  Procedure Laterality Date   ABDOMINAL HYSTERECTOMY  2008   complete    BIOPSY  04/02/2018  Procedure: BIOPSY;  Surgeon: Lemar Lofty., MD;  Location: [facility];  Service: Gastroenterology;;   CARDIAC CATHETERIZATION  2011   [facility]   COLONOSCOPY  2003   [facility]   ESOPHAGOGASTRODUODENOSCOPY (EGD) WITH PROPOFOL N/A 04/02/2018   Procedure: ESOPHAGOGASTRODUODENOSCOPY (EGD) WITH PROPOFOL;  Surgeon: Lemar Lofty., MD;  Location: [facility];  Service: Gastroenterology;  Laterality: N/A;   FOREIGN BODY REMOVAL  04/02/2018   Procedure: FOREIGN BODY REMOVAL;  Surgeon: Meridee Score Netty Starring., MD;  Location: [facility];  Service: Gastroenterology;;   HERNIA REPAIR Right 09/23/2012   repair RIH   LAPAROSCOPIC HYSTERECTOMY     PATELLAR TENDON REPAIR Left 06/03/2019    Procedure: LEFT PATELLA TENDON REPAIR, RIGHT KNEE INJECTION.;  Surgeon: Durene Romans, MD;  Location: [facility];  Service: Orthopedics;  Laterality: Left;   PATELLAR TENDON REPAIR Left 07/01/2019   Procedure: OPEN PATELLA TENDON REPAIR;  Surgeon: Durene Romans, MD;  Location: [facility];  Service: Orthopedics;  Laterality: Left;   TONSILLECTOMY     TOTAL KNEE ARTHROPLASTY Left 02/11/2019   Procedure: TOTAL KNEE ARTHROPLASTY;  Surgeon: Durene Romans, MD;  Location: [facility];  Service: Orthopedics;  Laterality: Left;  70 mins   Social History   Socioeconomic History   Marital status: Widowed    Spouse name: Not on file   Number of children: Not on file   Years of education: Not on file   Highest education level: Not on file  Occupational History   Not on file  Tobacco Use   Smoking status: Former    Current packs/day: 0.00    Average packs/day: 0.5 packs/day for 30.0 years (15.0 ttl pk-yrs)    Types: Cigarettes    Start date: 07/10/1958    Quit date: 07/10/1988    Years since quitting: 35.1   Smokeless tobacco: Never  Vaping Use   Vaping status: Never Used  Substance and Sexual Activity   Alcohol use: No   Drug use: Never   Sexual activity: Not on file  Other Topics Concern   Not on file  Social History Narrative   Lives locally with daughter.  Unsteady gait in the setting of severe lower extremity osteoarthritis-uses walker for ambulation.  Does not routinely exercise.   Social Drivers of Corporate investment banker Strain: Low Risk  (12/18/2022)   Overall Financial Resource Strain (CARDIA)    Difficulty of Paying Living Expenses: Not hard at all  Food Insecurity: No Food Insecurity (09/02/2023)   Hunger Vital Sign    Worried About Running Out of Food in the Last Year: Never true    Ran Out of Food in the Last Year: Never true  Transportation Needs: No Transportation Needs (09/02/2023)   PRAPARE - Administrator, Civil Service (Medical): No    Lack of Transportation  (Non-Medical): No  Physical Activity: Inactive (12/18/2022)   Exercise Vital Sign    Days of Exercise per Week: 0 days    Minutes of Exercise per Session: 0 min  Stress: No Stress Concern Present (12/18/2022)   [employer] of Occupational Health - Occupational Stress Questionnaire    Feeling of Stress : Not at all  Social Connections: Socially Isolated (09/02/2023)   Social Connection and Isolation Panel [NHANES]    Frequency of Communication with Friends and Family: More than three times a week    Frequency of Social Gatherings with Friends and Family: More than three times a week    Attends Religious Services: Never  Active Member of Clubs or Organizations: No    Attends Banker Meetings: Never    Marital Status: Widowed   Family History  Problem Relation Age of Onset   Heart disease Mother    Heart attack Mother    Heart disease Father    Heart attack Father    Breast cancer Sister    Breast cancer Maternal Aunt    Heart disease Brother        sister   Allergies  Allergen Reactions   Codeine Nausea Only   Prior to Admission medications   Medication Sig Start Date End Date Taking? Authorizing Provider  acetaminophen (TYLENOL) 325 MG tablet Take 650 mg by mouth every 6 (six) hours as needed.   Yes [provider]  alum & mag hydroxide-simeth (MAALOX PLUS) 400-400-40 MG/5ML suspension Take 5 mLs by mouth every 4 (four) hours as needed for indigestion.   Yes [provider]  aspirin EC 81 MG tablet Take 1 tablet (81 mg total) by mouth daily. Swallow whole. Aspirin & plavix for 21 days, f/by aspirin alone 06/12/22  Yes Delfino Lovett, MD  bisacodyl (DULCOLAX) 10 MG suppository Place 10 mg rectally as needed for moderate constipation.   Yes [provider]  Calcium-Magnesium-Vitamin D (CALCIUM 1200+D3 PO) Take 1 tablet by mouth daily.   Yes [provider]  enalapril (VASOTEC) 20 MG tablet Take 1 tablet (20 mg total) by mouth  daily. for blood pressure. 11/23/22 11/23/23 Yes Doreene Nest, NP  levothyroxine (SYNTHROID) 88 MCG tablet Take 1 tablet by mouth every morning on an empty stomach with water only.  No food or other medications for 30 minutes. 12/28/22  Yes Doreene Nest, NP  metoprolol tartrate (LOPRESSOR) 25 MG tablet Take 0.5 tablets (12.5 mg total) by mouth 2 (two) times daily. 12/28/22  Yes Doreene Nest, NP  Multiple Vitamin (MULTIVITAMIN PO) Take 1 tablet by mouth daily.   Yes [provider]  omeprazole (PRILOSEC) 40 MG capsule Take 1 capsule (40 mg total) by mouth daily. for heartburn. 11/22/22  Yes Doreene Nest, NP  polyethylene glycol (MIRALAX / GLYCOLAX) 17 g packet Take 17 g by mouth daily as needed for mild constipation.   Yes [provider]  simvastatin (ZOCOR) 20 MG tablet Take 1 tablet (20 mg total) by mouth every evening. for cholesterol. 12/28/22  Yes Doreene Nest, NP  traMADol (ULTRAM) 50 MG tablet Take 1 tablet (50 mg total) by mouth every 12 (twelve) hours as needed for severe pain (pain score 7-10). 05/31/23  Yes Doreene Nest, NP  lidocaine (LIDODERM) 5 % Apply to both shoulders, Both knees, right hip topically in the morning for pain. Remove & Discard patch within 12 hours or as directed by MD Patient not taking: Reported on 11/22/2022    [provider]  Zinc Oxide (TRIPLE PASTE) 12.8 % ointment Apply 1 Application topically as needed for irritation. Patient not taking: Reported on 09/01/2023    [provider]   CT Cervical Spine Wo Contrast Result Date: 09/01/2023 CLINICAL DATA:  Neck trauma, fall EXAM: CT CERVICAL SPINE WITHOUT CONTRAST TECHNIQUE: Multidetector CT imaging of the cervical spine was performed without intravenous contrast. Multiplanar CT image reconstructions were also generated. RADIATION DOSE REDUCTION: This exam was performed according to the departmental dose-optimization program which includes automated  exposure control, adjustment of the mA and/or kV according to patient size and/or use of iterative reconstruction technique. COMPARISON:  Plain films 10/17/2016 FINDINGS:  Alignment: No subluxation Skull base and vertebrae: No acute fracture. No primary bone lesion or focal pathologic process. Soft tissues and spinal canal: No prevertebral fluid or swelling. No visible canal hematoma. Disc levels: Diffuse advanced degenerative disc disease and facet disease. Upper chest: No acute findings Other: None IMPRESSION: Advanced degenerative disc and facet disease diffusely. No acute bony abnormality. Electronically Signed   By: Charlett Nose M.D.   On: 09/01/2023 19:16   CT Head Wo Contrast Result Date: 09/01/2023 CLINICAL DATA:  Head trauma, minor (Age >= 65y).  Fall. EXAM: CT HEAD WITHOUT CONTRAST TECHNIQUE: Contiguous axial images were obtained from the base of the skull through the vertex without intravenous contrast. RADIATION DOSE REDUCTION: This exam was performed according to the departmental dose-optimization program which includes automated exposure control, adjustment of the mA and/or kV according to patient size and/or use of iterative reconstruction technique. COMPARISON:  06/11/2022 FINDINGS: Brain: No acute intracranial abnormality. Specifically, no hemorrhage, hydrocephalus, mass lesion, acute infarction, or significant intracranial injury. Vascular: No hyperdense vessel or unexpected calcification. Skull: No acute calvarial abnormality. Sinuses/Orbits: No acute findings Other: None IMPRESSION: No acute intracranial abnormality. Electronically Signed   By: Charlett Nose M.D.   On: 09/01/2023 19:14   DG Humerus Right Result Date: 09/01/2023 CLINICAL DATA:  right upper arm pain after fall EXAM: RIGHT HUMERUS - 2+ VIEW COMPARISON:  X-ray 11/28/2017 FINDINGS: Acute obliquely oriented fracture through the mid right humeral diaphysis with approximately 1 cm of lateral displacement and mild shortening. A subtle  nondisplaced fracture component is seen extending into the proximal diaphysis. Chronic severe arthropathy of the shoulder with marked osseous remodeling of the glenohumeral joint. High-riding humeral head with progressive osseous remodeling of the distal clavicle and acromion. Generalized soft tissue prominence of the upper arm. IMPRESSION: 1. Acute mildly displaced fracture of the mid right humeral diaphysis. 2. 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 Electronically Signed   By: Duanne Guess D.O.   On: 09/01/2023 18:45    Positive ROS: All other systems have been reviewed and were otherwise negative with the exception of those mentioned in the HPI and as above.  Physical Exam: General: Alert, no acute distress Cardiovascular: No pedal edema Respiratory: No cyanosis, no use of accessory musculature GI: No organomegaly, abdomen is soft and non-tender Skin: No lesions in the area of chief complaint Neurologic: Sensation intact distally Psychiatric: Patient is competent for consent with normal mood and affect Lymphatic: No axillary or cervical lymphadenopathy  MUSCULOSKELETAL: Patient is awake but somewhat somnolent somnolent from medications.  Her daughter and grandson are present to help.  I rewrapped her Ace bandages and relieved pressure on the arm.  She has intact neurovascular status distally.  Mild swelling.  She is able to make a fist.  Skin is intact.  No other orthopedic injuries are noted cutely.  She does have chronic left knee problems and subluxation of the patella.  Assessment: Spiral fracture right humeral shaft  Plan: Continue with coaptation splint for now. She is not a surgical candidate at this point in time and probably will not be. I will continue to watch her in the hospital. She may be mobilized as tolerated.    Valinda Hoar, MD [phone]   09/02/2023 11:56 AM

## 2023-09-02 NOTE — Progress Notes (Signed)
 A consult was placed to the hospital's IV nurse for new IV access; pt on heparin and fluids; per RN, both IVs came out when a procedure was done; pt limited to Left arm only due to injury on R arm; multiple bruises noted on Left, and entire extremity very cold to touch; able to place a new IV, in the LUA , with ultrasound; very poor, limited access; asked by Lab to draw blood with IV start, as pt has had multiple "sticks" today for labs; suggest central access if this pt will need continued IVs as her peripheral status is limited.  Thank you.

## 2023-09-02 NOTE — Assessment & Plan Note (Addendum)
 Closed right humeral fracture. The patient has subsequent right humerus fracture. Orthopedic is recommending conservative management.  Sling was placed.  Concern of bleeding around the fracture site. -Continue with pain management -Continue to monitor for any compartment syndrome

## 2023-09-02 NOTE — ED Notes (Signed)
 Pt heparin IVF infusing to lac 2og piv site appears cdi patent not infiltrated @ this time pt 20g PIV to left wrist appears cdi patent .9 NS IVF /hr infusing no infiltration noted @ this time pt family @ bs pt has pure wick in place approx clear yellow urine output noted pt is aox4 speaking in full clear sentences denies needs @ this time RUE sling remains in place to support splinted RUE

## 2023-09-03 ENCOUNTER — Inpatient Hospital Stay (HOSPITAL_COMMUNITY)
Admit: 2023-09-03 | Discharge: 2023-09-03 | Disposition: A | Discharge status: Home / Self Care | Payer: HMO | Attending: Family Medicine | Admitting: Family Medicine

## 2023-09-03 ENCOUNTER — Inpatient Hospital Stay: Payer: HMO

## 2023-09-03 DIAGNOSIS — D62 Acute posthemorrhagic anemia: Secondary | ICD-10-CM | POA: Diagnosis not present

## 2023-09-03 DIAGNOSIS — S42341A Displaced spiral fracture of shaft of humerus, right arm, initial encounter for closed fracture: Secondary | ICD-10-CM | POA: Diagnosis not present

## 2023-09-03 DIAGNOSIS — N179 Acute kidney failure, unspecified: Secondary | ICD-10-CM

## 2023-09-03 DIAGNOSIS — I214 Non-ST elevation (NSTEMI) myocardial infarction: Secondary | ICD-10-CM

## 2023-09-03 DIAGNOSIS — E875 Hyperkalemia: Secondary | ICD-10-CM

## 2023-09-03 DIAGNOSIS — W19XXXA Unspecified fall, initial encounter: Secondary | ICD-10-CM | POA: Diagnosis not present

## 2023-09-03 HISTORY — DX: Hyperkalemia: E87.5

## 2023-09-03 LAB — ECHOCARDIOGRAM COMPLETE
AR max vel: 3.23 cm2
AV Area VTI: 3.84 cm2
AV Area mean vel: 3.33 cm2
AV Mean grad: 3.5 mm[Hg]
AV Peak grad: 5.8 mm[Hg]
Ao pk vel: 1.21 m/s
Area-P 1/2: 2.81 cm2
Height: 60 in
MV VTI: 2.65 cm2
S' Lateral: 2.2 cm
Weight: 2208 [oz_av]

## 2023-09-03 LAB — RENAL FUNCTION PANEL
Albumin: 3.2 g/dL — ABNORMAL LOW (ref 3.5–5.0)
Anion gap: 11 (ref 5–15)
BUN: 54 mg/dL — ABNORMAL HIGH (ref 8–23)
CO2: 20 mmol/L — ABNORMAL LOW (ref 22–32)
Calcium: 8 mg/dL — ABNORMAL LOW (ref 8.9–10.3)
Chloride: 101 mmol/L (ref 98–111)
Creatinine, Ser: 2.15 mg/dL — ABNORMAL HIGH (ref 0.44–1.00)
GFR, Estimated: 22 mL/min — ABNORMAL LOW (ref >60)
Glucose, Bld: 133 mg/dL — ABNORMAL HIGH (ref 70–99)
Phosphorus: 6.4 mg/dL — ABNORMAL HIGH (ref 2.5–4.6)
Potassium: 5.3 mmol/L — ABNORMAL HIGH (ref 3.5–5.1)
Sodium: 132 mmol/L — ABNORMAL LOW (ref 135–145)

## 2023-09-03 LAB — CBC
HCT: 26.2 % — ABNORMAL LOW (ref 36.0–46.0)
Hemoglobin: 9.1 g/dL — ABNORMAL LOW (ref 12.0–15.0)
MCH: 30.8 pg (ref 26.0–34.0)
MCHC: 34.7 g/dL (ref 30.0–36.0)
MCV: 88.8 fL (ref 80.0–100.0)
Platelets: 131 10*3/uL — ABNORMAL LOW (ref 150–400)
RBC: 2.95 MIL/uL — ABNORMAL LOW (ref 3.87–5.11)
RDW: 23.2 % — ABNORMAL HIGH (ref 11.5–15.5)
WBC: 12 10*3/uL — ABNORMAL HIGH (ref 4.0–10.5)
nRBC: 0.2 % (ref 0.0–0.2)

## 2023-09-03 LAB — RESP PANEL BY RT-PCR (RSV, FLU A&B, COVID)  RVPGX2
Influenza A by PCR: NEGATIVE
Influenza B by PCR: NEGATIVE
Resp Syncytial Virus by PCR: NEGATIVE
SARS Coronavirus 2 by RT PCR: NEGATIVE

## 2023-09-03 LAB — TROPONIN I (HIGH SENSITIVITY)
Troponin I (High Sensitivity): 1431 ng/L (ref <18)
Troponin I (High Sensitivity): 1150 ng/L (ref <18)

## 2023-09-03 LAB — LACTIC ACID, PLASMA: Lactic Acid, Venous: 1.7 mmol/L (ref 0.5–1.9)

## 2023-09-03 MED ORDER — SODIUM ZIRCONIUM CYCLOSILICATE 10 G PO PACK
10.0000 g | PACK | Freq: Every day | ORAL | Status: DC
Start: 2023-09-03 — End: 2023-09-04
  Administered 2023-09-03 – 2023-09-04 (×2): 10 g via ORAL
  Filled 2023-09-03 (×3): qty 1

## 2023-09-03 NOTE — Progress Notes (Signed)
 Rounding Note    Patient Name: Kathleen Paul Date of Encounter: 09/03/2023  [facility] Cardiologist: Dietrich Pates, MD   Subjective   Patient continues to have pain in the right arm. She denies chest pain and shortness of breath. Received 1 unit of PRBCs yesterday with improvement in H&H. Patient has not made urine since yesterday evening ~11PM with a bump in Cr 0.98>2.15.   Inpatient Medications    Scheduled Meds:  aspirin EC  81 mg Oral Daily   gabapentin  100 mg Oral TID   levothyroxine  88 mcg Oral Q0600   multivitamin with minerals  1 tablet Oral Daily   pantoprazole  40 mg Oral Daily   simvastatin  40 mg Oral q1800   sodium zirconium cyclosilicate  10 g Oral Daily   Continuous Infusions:  lactated ringers Stopped (09/02/23 1817)   promethazine (PHENERGAN) injection (IM or IVPB) Stopped (09/02/23 1107)   PRN Meds: acetaminophen, ALPRAZolam, alum & mag hydroxide-simeth, bisacodyl, HYDROcodone-acetaminophen, morphine injection, nitroGLYCERIN, ondansetron (ZOFRAN) IV, mouth rinse, polyethylene glycol, promethazine (PHENERGAN) injection (IM or IVPB)   Vital Signs    Vitals:   09/02/23 2347 09/03/23 0000 09/03/23 0613 09/03/23 0803  BP: (!) 84/50 119/63 (!) 158/123 (!) 154/118  Pulse: (!) 54  (!) 102 92  Resp: 16  20   Temp: 98.8 F (37.1 C)  99 F (37.2 C) 98.6 F (37 C)  TempSrc: Oral  Oral   SpO2: 100%  100% 99%  Weight:      Height:        Intake/Output Summary (Last 24 hours) at 09/03/2023 1045 Last data filed at 09/03/2023 0600 Gross per 24 hour  Intake 2900 ml  Output 1 ml  Net 2899 ml      09/01/2023    5:51 PM 12/18/2022    1:21 PM 11/22/2022    2:14 PM  Last 3 Weights  Weight (lbs) 138 lb 150 lb 154 lb  Weight (kg) 62.596 kg 68.04 kg 69.854 kg      Telemetry    Sinus rhythm with PACs - Personally Reviewed  Physical Exam   GEN: No acute distress.   Neck: No JVD Cardiac: RRR, no murmurs, rubs, or gallops.  Respiratory: Clear to  auscultation bilaterally. GI: Soft, nontender, non-distended  MS: Right arm is wrapped and sling in place. No edema. Neuro:  Nonfocal  Psych: Normal affect   Labs    High Sensitivity Troponin:   Recent Labs  Lab 09/01/23 2030 09/02/23 0621 09/02/23 1209 09/03/23 0032 09/03/23 0532  TROPONINIHS 410* 1,150* 1,260* 1,431* 1,150*     Chemistry Recent Labs  Lab 09/01/23 1747 09/02/23 0622 09/03/23 0532  NA 134* 131* 132*  K 3.8 4.2 5.3*  CL 96* 99 101  CO2 20* 16* 20*  GLUCOSE 134* 160* 133*  BUN 45* 40* 54*  CREATININE 0.98 0.98 2.15*  CALCIUM 9.6 8.4* 8.0*  ALBUMIN  --   --  3.2*  GFRNONAA 57* 57* 22*  ANIONGAP 18* 16* 11    Lipids  Recent Labs  Lab 09/02/23 0622  CHOL 144  TRIG 57  HDL 68  LDLCALC 65  CHOLHDL 2.1    Hematology Recent Labs  Lab 09/01/23 1747 09/02/23 0622 09/02/23 1209 09/02/23 2015 09/03/23 0532  WBC 5.7 9.0  --   --  12.0*  RBC 3.46* 2.74* 2.50*  --  2.95*  HGB 11.3* 8.9* 8.2* 9.5* 9.1*  HCT 34.4* 27.9* 25.2* 27.7* 26.2*  MCV 99.4  101.8*  --   --  88.8  MCH 32.7 32.5  --   --  30.8  MCHC 32.8 31.9  --   --  34.7  RDW 15.9* 15.9*  --   --  23.2*  PLT 180 188  --   --  131*   Thyroid No results for input(s): "TSH", "FREET4" in the last 168 hours.  BNPNo results for input(s): "BNP", "PROBNP" in the last 168 hours.  DDimer No results for input(s): "DDIMER" in the last 168 hours.   Radiology    CT HUMERUS RIGHT WO CONTRAST Result Date: 09/02/2023 CLINICAL DATA:  Right humeral fracture.  Decreasing hemoglobin IMPRESSION: 1. Acute mildly displaced and angulated fracture of the mid right humeral diaphysis. 2. Soft tissue swelling and ill-defined hemorrhage of the upper arm. No organized hematoma. 3. 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 4. Partially seen large hiatal hernia. Electronically Signed   By: Duanne Guess D.O.   On: 09/02/2023 13:59   CT ABDOMEN PELVIS WO CONTRAST Result Date:  09/02/2023 IMPRESSION: Large sliding-type hiatal hernia is again noted. Sigmoid diverticulosis without inflammation. No acute abnormality seen in the abdomen or pelvis. Severe degenerative changes are noted involving the right hip joint as noted on prior exam. Aortic Atherosclerosis (ICD10-I70.0). Electronically Signed   By: Lupita Raider M.D.   On: 09/02/2023 13:53   CT Cervical Spine Wo Contrast Result Date: 09/01/2023  IMPRESSION: Advanced degenerative disc and facet disease diffusely. No acute bony abnormality. Electronically Signed   By: Charlett Nose M.D.   On: 09/01/2023 19:16   CT Head Wo Contrast Result Date: 09/01/2023 IMPRESSION: No acute intracranial abnormality. Electronically Signed   By: Charlett Nose M.D.   On: 09/01/2023 19:14   DG Humerus Right Result Date: 09/01/2023 IMPRESSION: 1. Acute mildly displaced fracture of the mid right humeral diaphysis. 2. 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 Electronically Signed   By: Duanne Guess D.O.   On: 09/01/2023 18:45    Cardiac Studies   06/2022 Echo complete 1. Left ventricular ejection fraction, by estimation, is 50 to 55%. The  left ventricle has low normal function. Left ventricular endocardial  border not optimally defined to evaluate regional wall motion. Left  ventricular diastolic parameters are  consistent with Grade I diastolic dysfunction (impaired relaxation).   2. Right ventricular systolic function is normal. The right ventricular  size is normal.   3. The mitral valve is normal in structure. Trivial mitral valve  regurgitation. No evidence of mitral stenosis.   4. The aortic valve is calcified. Aortic valve regurgitation is mild.  Aortic valve sclerosis/calcification is present, without any evidence of  aortic stenosis.   5. The inferior vena cava is normal in size with greater than 50%  respiratory variability, suggesting right atrial pressure of 3 mmHg.   Patient Profile     Kathleen Paul is  a 88 y.o. female with a history of nonobstructive CAD, HTN, HL, diastolic dysfxn, PSVT, TIA, 1st deg AVB, intermittent mobitz 1 HB, RBBB, anxiety, OA, diverticuosis, esophageal stricture, gastric ulcer, and remote h/o DVT, who is being seen today for the evaluation of elevated troponin in the setting of fall w/ R humeral fx .   Assessment & Plan    Demand ischemia CAD - Patient with prior history of 50% mid LAD stenosis on cardiac cath in 2011 - Echo 2023 with LVEF 50-55% - Presented 2/22 following a mechanical fall with injury to  right shoulder and right humeral fracture - EKG showed sinus rhythm with PVC, PACs, inferior infarct, and subtle inferolateral ST elevation - Denies chest pain and dyspnea - Troponin peaked at 1431  - Heparin was started but later discontinued secondary to anemia - Continue ASA and statin - No BB in the setting of prior hx bradycardia - Echo ordered, further recommendations pending results - Likely not a candidate for cardiac cath at this time in the setting of anemia and AKI  Fall Right humeral fracture - Having significant pain - Ortho consulted yesterday and determined she is not a surgical candidate - Pain management per IM  Anemia - H&H dropped significantly with initiation of IV heparin which was later discontinued - CT showed concern for bleeding around fracture site in right humerus - Received 1 unit PRBCs yesterday - H&H improved to 9.1 and 26.2 today  Acute kidney injury - No known history of CKD - Patient has not made urine since yesterday evening - Cr 0.98>2.15 - Renal US done, results pending - Gentle hydration and management per IM  Primary hypertension - BP low yesterday, enalapril and metoprolol held  Hyperlipidemia - LDL 65, continue simvastatin  For questions or updates, please contact [facility] Please consult [email or web address] for contact info under        Signed, Orion Crook, PA-C  09/03/2023, 10:45 AM

## 2023-09-03 NOTE — Progress Notes (Signed)
 Progress Note   Patient: Kathleen Paul:[id number] DOB: [birth year] DOA: 09/01/2023     2 DOS: the patient was seen and examined on 09/03/2023   Brief hospital course: Taken from H&P.  SHANIE MAUZY is a 88 y.o. Caucasian female with medical history significant for anxiety, osteoarthritis, diverticulosis, gastric ulcer, history of DVT, dyslipidemia, essential hypertension and nonobstructive coronary artery disease, who presented to the emergency room with acute onset of fall while ambulating with her walker at home after using the bathroom.  She hit her head.  She believes that her knee gave out on her.  No presyncope or syncope.   On presentation patient has borderline bradycardia at 46, blood pressure 149/82, labs with mild hyponatremia at 134 , CO2 of 20, BUN 45, anion gap of 18.  Troponin 25>410.  Coagulation profile was normal.  UA was unremarkable. EKG with NSR, multiple PVCs, right bundle branch block and inferior Q waves CT head with no acute intracranial abnormalities, CT C-spine with advanced degenerative disc and facet disease diffusely with no acute bony abnormality. Right humerus x-ray with acute mildly displaced fracture of the mid right humeral diaphysis, severe chronic arthropathy of the right shoulder.  Cardiology and orthopedic surgery was consulted.,  Started on heparin infusion.  2/23: Vital stable with borderline soft blood pressure at 95/37, labs with significant increase in APTT to above 200, hemoglobin decreased to 8.9 from 11.3 yesterday, BMP with slight worsening of hyponatremia at 131, CO2 16, BUN 40, anion gap of 16, lipid panel normal. Starting on LR, UA with 20 ketones, lactic acid of 2.4. Repeat hemoglobin came back at 8.2.  CT of right upper extremity and abdomen was obtained as she was having abdominal pain to rule out any concealed bleeding, CT abdomen was without any acute abnormality but CT of right humerus with concern of bleeding.  Discussed with  orthopedic surgery and they were just recommending monitoring and blood transfusion with the hope that bleeding will stop.  They are recommending nonsurgical conservative management.  Also discussed with cardiology and heparin infusion was stopped.  Troponin continued to get worse. Ordered 1 unit of PRBC.  2/24: Hemodynamically stable and clinically improving.  Hemoglobin 9.1.  B12 1621.  Lactic acidosis resolved, worsening renal function with creatinine at 2.15 and hyperkalemia with potassium at 5.3.  Patient has not made any urine after midnight.  Giving some IV fluid, renal ultrasound without any significant abnormality.  Troponin peaked at 1431 and now trending down, echocardiogram pending.   Assessment and Plan: * NSTEMI (non-ST elevated myocardial infarction) (HCC) No chest pain with troponin peaked at 1431.   Cardiology is on board. Pending echocardiogram Patient was started on heparin infusion-significant decrease in hemoglobin and supratherapeutic APTT so it was discontinued. -Continue to trend troponin -Follow-up echocardiogram -Follow-up cardiology recommendations   AKI (acute kidney injury) (HCC) Patient developed AKI with creatinine of 2.15 and BUN of 54.  Normal renal function at baseline.  Renal ultrasound without any significant abnormality.  No significant urinary output after midnight.  May be ATN with hypotension. -Giving some IV fluid -Monitor with bladder scan to rule out any outlet obstruction -Monitor renal function -Avoid nephro toxins  Fall Closed right spiral humeral fracture. The patient has subsequent right humerus fracture. Orthopedic is recommending conservative management.  Sling was placed.  Concern of bleeding around the fracture site. -Continue with pain management -Continue to monitor for any compartment syndrome   Hyperkalemia Likely with AKI.  Potassium at 5.3 -Started on  Lokelma -Monitor potassium  Acute blood loss anemia (ABLA) Significant  decrease in hemoglobin on 2/23 Patient was on heparin infusion for supratherapeutic APTT for concern of NSTEMI.  CT abdomen and right humerus was obtained due to worsening right upper extremity swelling and abdominal pain. -CT abdomen was negative for any acute abnormality -Right humeral CT with concern of bleeding around fracture site -Heparin was held -S/p 1 unit of PRBC, hemoglobin of 9.1 this morning -Anemia panel with no significant deficiency, -Monitor hemoglobin -Keep above 8 due to concern of NSTEMI  Essential hypertension Blood pressure currently within goal Concern of NSTEMI and bleeding. -Giving some IV fluid -Holding home metoprolol  Lactic acidosis Resolved now.  Likely with NSTEMI and fall resulted in right humeral fracture.   Hypothyroidism - We will continue Synthroid.  Dyslipidemia Lipid profile normal. -Continuing home Zocor  GERD without esophagitis - We will continue PPI therapy   Subjective: Patient was seen and examined today.  Much improved swelling and pain of right upper extremity.  No chest pain or shortness of breath.  Per patient she had a urinary incontinence resulted in swelling of all her clothing and bed around midnight, no urinary output since then.  Physical Exam: Vitals:   09/03/23 0613 09/03/23 0803 09/03/23 1201 09/03/23 1629  BP: (!) 158/123 (!) 154/118 106/75 115/61  Pulse: (!) 102 92 93 96  Resp: 20     Temp: 99 F (37.2 C) 98.6 F (37 C) 97.6 F (36.4 C) 97.9 F (36.6 C)  TempSrc: Oral     SpO2: 100% 99% 98% 96%  Weight:      Height:       General.  Frail elderly lady, in no acute distress. Pulmonary.  Lungs clear bilaterally, normal respiratory effort. CV.  Regular rate and rhythm, no JVD, rub or murmur. Abdomen.  Soft, nontender, nondistended, BS positive. CNS.  Alert and oriented .  No focal neurologic deficit. Extremities.  No edema, no cyanosis, pulses intact and symmetrical.  Right upper extremity in sling  Data  Reviewed: Prior data reviewed  Family Communication: Discussed with  DIL at bedside  Disposition: Status is: Inpatient Remains inpatient appropriate because: Severity of illness  Planned Discharge Destination:  To be determined  Time spent: 50 minutes  This record has been created using Conservation officer, historic buildings. Errors have been sought and corrected,but may not always be located. Such creation errors do not reflect on the standard of care.   Author: Arnetha Courser, MD 09/03/2023 5:06 PM  For on call review [email or web address].

## 2023-09-03 NOTE — Plan of Care (Signed)

## 2023-09-03 NOTE — Assessment & Plan Note (Signed)
 Patient developed AKI with creatinine of 2.15 and BUN of 54.  Normal renal function at baseline.  Renal ultrasound without any significant abnormality.  No significant urinary output after midnight.  May be ATN with hypotension. -Giving some IV fluid -Monitor with bladder scan to rule out any outlet obstruction -Monitor renal function -Avoid nephro toxins

## 2023-09-03 NOTE — Assessment & Plan Note (Signed)
 No chest pain with troponin peaked at 1431.  Cardiology recommended conservative management.  Patient on aspirin, metoprolol, simvastatin.

## 2023-09-03 NOTE — Progress Notes (Signed)
 Subjective:   The patient is much more alert and positive today.  She said the pain was better controlled.  Neurovascular status is good distally.  Hemoglobin is 9.1 after a unit of blood yesterday.  TXA is acceptable.  May start heparin if necessary.    Patient reports pain as moderate.  Objective:   The coaptation splint splint is in place. Minimal swelling distally. Neurovascular status good distally. Patient is able to make a good fist.  VITALS:   Vitals:   09/03/23 0803 09/03/23 1201  BP: (!) 154/118 106/75  Pulse: 92 93  Resp:    Temp: 98.6 F (37 C) 97.6 F (36.4 C)  SpO2: 99% 98%    LABS Recent Labs    09/01/23 1747 09/02/23 0622 09/02/23 1209 09/02/23 2015 09/03/23 0532  HGB 11.3* 8.9* 8.2* 9.5* 9.1*  HCT 34.4* 27.9* 25.2* 27.7* 26.2*  WBC 5.7 9.0  --   --  12.0*  PLT 180 188  --   --  131*    Recent Labs    09/01/23 1747 09/02/23 0622 09/03/23 0532  NA 134* 131* 132*  K 3.8 4.2 5.3*  BUN 45* 40* 54*  CREATININE 0.98 0.98 2.15*  GLUCOSE 134* 160* 133*    Recent Labs    09/01/23 2129 09/02/23 0622  INR 1.1 1.4*     Assessment/Plan:    Spiral fracture right humeral shaft  Advance diet Up with therapy

## 2023-09-03 NOTE — Assessment & Plan Note (Signed)
 Closed right spiral humeral fracture. The patient has subsequent right humerus fracture. Orthopedic is recommending conservative management.  Sling was placed.  Concern of bleeding around the fracture site. -Continue with pain management -Continue to monitor for any compartment syndrome

## 2023-09-03 NOTE — Progress Notes (Signed)
*  PRELIMINARY RESULTS* Echocardiogram 2D Echocardiogram has been performed.  Cristela Blue 09/03/2023, 7:51 AM

## 2023-09-03 NOTE — Assessment & Plan Note (Signed)
 Patient on metoprolol

## 2023-09-03 NOTE — Assessment & Plan Note (Signed)
 Significant decrease in hemoglobin on 2/23 Patient was on heparin infusion for supratherapeutic APTT for concern of NSTEMI.  CT abdomen and right humerus was obtained due to worsening right upper extremity swelling and abdominal pain. -CT abdomen was negative for any acute abnormality -Right humeral CT with concern of bleeding around fracture site -Heparin was held -S/p 1 unit of PRBC, hemoglobin of 9.1 this morning -Anemia panel with no significant deficiency, -Monitor hemoglobin -Keep above 8 due to concern of NSTEMI

## 2023-09-03 NOTE — Assessment & Plan Note (Signed)
 Resolved with Lokelma. -Monitor potassium

## 2023-09-04 ENCOUNTER — Inpatient Hospital Stay: Payer: HMO

## 2023-09-04 DIAGNOSIS — D62 Acute posthemorrhagic anemia: Secondary | ICD-10-CM | POA: Diagnosis not present

## 2023-09-04 DIAGNOSIS — W19XXXA Unspecified fall, initial encounter: Secondary | ICD-10-CM | POA: Diagnosis not present

## 2023-09-04 DIAGNOSIS — S42341A Displaced spiral fracture of shaft of humerus, right arm, initial encounter for closed fracture: Secondary | ICD-10-CM | POA: Diagnosis not present

## 2023-09-04 DIAGNOSIS — I214 Non-ST elevation (NSTEMI) myocardial infarction: Secondary | ICD-10-CM | POA: Diagnosis not present

## 2023-09-04 LAB — RENAL FUNCTION PANEL
Albumin: 2.7 g/dL — ABNORMAL LOW (ref 3.5–5.0)
Anion gap: 9 (ref 5–15)
BUN: 54 mg/dL — ABNORMAL HIGH (ref 8–23)
CO2: 23 mmol/L (ref 22–32)
Calcium: 7.5 mg/dL — ABNORMAL LOW (ref 8.9–10.3)
Chloride: 96 mmol/L — ABNORMAL LOW (ref 98–111)
Creatinine, Ser: 1.46 mg/dL — ABNORMAL HIGH (ref 0.44–1.00)
GFR, Estimated: 35 mL/min — ABNORMAL LOW (ref >60)
Glucose, Bld: 115 mg/dL — ABNORMAL HIGH (ref 70–99)
Phosphorus: 3.1 mg/dL (ref 2.5–4.6)
Potassium: 3.7 mmol/L (ref 3.5–5.1)
Sodium: 128 mmol/L — ABNORMAL LOW (ref 135–145)

## 2023-09-04 LAB — HEMOGLOBIN AND HEMATOCRIT, BLOOD
HCT: 25 % — ABNORMAL LOW (ref 36.0–46.0)
Hemoglobin: 8.5 g/dL — ABNORMAL LOW (ref 12.0–15.0)

## 2023-09-04 LAB — CBC
HCT: 20.8 % — ABNORMAL LOW (ref 36.0–46.0)
Hemoglobin: 7.1 g/dL — ABNORMAL LOW (ref 12.0–15.0)
MCH: 30.6 pg (ref 26.0–34.0)
MCHC: 34.1 g/dL (ref 30.0–36.0)
MCV: 89.7 fL (ref 80.0–100.0)
Platelets: 108 10*3/uL — ABNORMAL LOW (ref 150–400)
RBC: 2.32 MIL/uL — ABNORMAL LOW (ref 3.87–5.11)
RDW: 22.7 % — ABNORMAL HIGH (ref 11.5–15.5)
WBC: 9.6 10*3/uL (ref 4.0–10.5)
nRBC: 0 % (ref 0.0–0.2)

## 2023-09-04 LAB — PREPARE RBC (CROSSMATCH)

## 2023-09-04 LAB — LIPOPROTEIN A (LPA): Lipoprotein (a): 9.1 nmol/L (ref <75.0)

## 2023-09-04 MED ORDER — SODIUM CHLORIDE 0.9% IV SOLUTION: Freq: Once | INTRAVENOUS | Status: AC

## 2023-09-04 MED ORDER — LACTULOSE 10 GM/15ML PO SOLN
30.0000 g | Freq: Two times a day (BID) | ORAL | Status: DC | PRN
  Administered 2023-09-04 – 2023-09-09 (×3): 30 g via ORAL
  Filled 2023-09-04 (×5): qty 60

## 2023-09-04 MED ORDER — CALCIUM CARBONATE 1250 (500 CA) MG PO TABS
1.0000 | ORAL_TABLET | Freq: Two times a day (BID) | ORAL | Status: DC
  Administered 2023-09-04 – 2023-09-19 (×30): 1250 mg via ORAL
  Filled 2023-09-04 (×31): qty 1

## 2023-09-04 MED ORDER — SODIUM CHLORIDE 0.9 % IV SOLN: INTRAVENOUS | Status: AC

## 2023-09-04 NOTE — Evaluation (Signed)
 Occupational Therapy Evaluation Patient Details Name: Kathleen Paul MRN: [id number] DOB: 01-22-1936 Today's Date: 09/04/2023   History of Present Illness   Kathleen Paul is an 87yoF who comes to [facility] on 2/22 after fall at home. Noted elevation of troponin as well. Foundtohave Acute mildly displaced fracture of the mid right humeral diaphysis; managed non surgically. 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    Clinical Impressions Kathleen Paul was seen for OT evaluation this date. Prior to hospital admission, pt was MOD I using rollator. Pt lives with daughter who works. Pt currently requires MAX A don B socks and R sling in sitting. MAX A sit<>stand at EOB with L knee buckling. Pt would benefit from skilled OT to address noted impairments and functional limitations (see below for any additional details). Upon hospital discharge, recommend OT follow up <3 hours/day.     If plan is discharge home, recommend the following:   Two people to help with walking and/or transfers;Two people to help with bathing/dressing/bathroom;Help with stairs or ramp for entrance     Functional Status Assessment   Patient has had a recent decline in their functional status and demonstrates the ability to make significant improvements in function in a reasonable and predictable amount of time.     Equipment Recommendations   BSC/3in1;Wheelchair (measurements OT)     Recommendations for Other Services         Precautions/Restrictions   Precautions Precautions: None Required Braces or Orthoses: Splint/Cast Splint/Cast - Date Prophylactic Dressing Applied (if applicable): 09/01/23 Restrictions Weight Bearing Restrictions Per Provider Order: Yes RUE Weight Bearing Per Provider Order: Non weight bearing     Mobility Bed Mobility Overal bed mobility: Needs Assistance Bed Mobility: Supine to Sit, Sit  to Supine     Supine to sit: Mod assist Sit to supine: Max assist        Transfers Overall transfer level: Needs assistance Equipment used: 1 person hand held assist Transfers: Sit to/from Stand Sit to Stand: Max assist                  Balance Overall balance assessment: Needs assistance Sitting-balance support: No upper extremity supported, Feet supported Sitting balance-Leahy Scale: Fair     Standing balance support: Single extremity supported Standing balance-Leahy Scale: Poor                             ADL either performed or assessed with clinical judgement   ADL Overall ADL's : Needs assistance/impaired                                       General ADL Comments: MAX A don B socks and R sling in sitting.      Pertinent Vitals/Pain Pain Assessment Pain Assessment: 0-10 Pain Score: 3  Pain Location: back Pain Descriptors / Indicators: Discomfort Pain Intervention(s): Ice applied     Extremity/Trunk Assessment Upper Extremity Assessment Upper Extremity Assessment: Right hand dominant;RUE deficits/detail RUE: Unable to fully assess due to immobilization   Lower Extremity Assessment Lower Extremity Assessment: Generalized weakness       Communication Communication Communication: No apparent difficulties   Cognition Arousal: Alert Behavior During Therapy: WFL for tasks assessed/performed Cognition: No apparent impairments  Following commands: Intact                  Home Living Family/patient expects to be discharged to:: Private residence Living Arrangements: Children (daughter) Available Help at Discharge: Family;Available PRN/intermittently Type of Home: House Home Access: Ramped entrance     Home Layout: Able to live on main level with bedroom/bathroom;Two level     Bathroom Shower/Tub: Chief Strategy Officer: Standard (with riser)      Home Equipment: Rollator (4 wheels);Shower seat   Additional Comments: sleeps in recliner      Prior Functioning/Environment Prior Level of Function : Needs assist             Mobility Comments: Mod I using rollator, no falls in past 6 months at least ADLs Comments: Pt has limited bil shoulder AROM, thus daughter assists with bathing and dressing    OT Problem List: Decreased strength;Decreased range of motion;Decreased activity tolerance;Impaired balance (sitting and/or standing)   OT Treatment/Interventions: Therapeutic exercise;Self-care/ADL training;Energy conservation;DME and/or AE instruction;Therapeutic activities      OT Goals(Current goals can be found in the care plan section)   Acute Rehab OT Goals Patient Stated Goal: to go home OT Goal Formulation: With patient/family Time For Goal Achievement: 09/18/23 Potential to Achieve Goals: Good ADL Goals Pt Will Perform Grooming: with min assist;sitting Pt Will Perform Lower Body Dressing: with min assist;sit to/from stand;with caregiver independent in assisting Pt Will Transfer to Toilet: with min assist;stand pivot transfer;bedside commode   OT Frequency:  Min 1X/week    Co-evaluation              AM-PAC OT "6 Clicks" Daily Activity     Outcome Measure Help from another person eating meals?: None Help from another person taking care of personal grooming?: A Little Help from another person toileting, which includes using toliet, bedpan, or urinal?: A Lot Help from another person bathing (including washing, rinsing, drying)?: A Lot Help from another person to put on and taking off regular upper body clothing?: A Little Help from another person to put on and taking off regular lower body clothing?: A Lot 6 Click Score: 16   End of Session Nurse Communication: Mobility status  Activity Tolerance: Patient tolerated treatment well Patient left: in bed;with call bell/phone within reach;with bed alarm  set;with family/visitor present  OT Visit Diagnosis: Unsteadiness on feet (R26.81)                Time: 7829-5621 OT Time Calculation (min): 31 min Charges:  OT General Charges $OT Visit: 1 Visit OT Evaluation $OT Eval Moderate Complexity: 1 Mod OT Treatments $Self Care/Home Management : 8-22 mins  Kathleen Paul, M.S. OTR/L  09/04/23, 4:30 PM  ascom [phone]

## 2023-09-04 NOTE — Progress Notes (Signed)
 Progress Note   Patient: Kathleen Paul:[id number] DOB: 1935-12-07 DOA: 09/01/2023     3 DOS: the patient was seen and examined on 09/04/2023   Brief hospital course: Taken from H&P.  Kathleen Paul is a 88 y.o. Caucasian female with medical history significant for anxiety, osteoarthritis, diverticulosis, gastric ulcer, history of DVT, dyslipidemia, essential hypertension and nonobstructive coronary artery disease, who presented to the emergency room with acute onset of fall while ambulating with her walker at home after using the bathroom.  She hit her head.  She believes that her knee gave out on her.  No presyncope or syncope.   On presentation patient has borderline bradycardia at 46, blood pressure 149/82, labs with mild hyponatremia at 134 , CO2 of 20, BUN 45, anion gap of 18.  Troponin 25>410.  Coagulation profile was normal.  UA was unremarkable. EKG with NSR, multiple PVCs, right bundle branch block and inferior Q waves CT head with no acute intracranial abnormalities, CT C-spine with advanced degenerative disc and facet disease diffusely with no acute bony abnormality. Right humerus x-ray with acute mildly displaced fracture of the mid right humeral diaphysis, severe chronic arthropathy of the right shoulder.  Cardiology and orthopedic surgery was consulted.,  Started on heparin infusion.  2/23: Vital stable with borderline soft blood pressure at 95/37, labs with significant increase in APTT to above 200, hemoglobin decreased to 8.9 from 11.3 yesterday, BMP with slight worsening of hyponatremia at 131, CO2 16, BUN 40, anion gap of 16, lipid panel normal. Starting on LR, UA with 20 ketones, lactic acid of 2.4. Repeat hemoglobin came back at 8.2.  CT of right upper extremity and abdomen was obtained as she was having abdominal pain to rule out any concealed bleeding, CT abdomen was without any acute abnormality but CT of right humerus with concern of bleeding.  Discussed with  orthopedic surgery and they were just recommending monitoring and blood transfusion with the hope that bleeding will stop.  They are recommending nonsurgical conservative management.  Also discussed with cardiology and heparin infusion was stopped.  Troponin continued to get worse. Ordered 1 unit of PRBC.  2/24: Hemodynamically stable and clinically improving.  Hemoglobin 9.1.  B12 1621.  Lactic acidosis resolved, worsening renal function with creatinine at 2.15 and hyperkalemia with potassium at 5.3.  Patient has not made any urine after midnight.  Giving some IV fluid, renal ultrasound without any significant abnormality.  Troponin peaked at 1431 and now trending down, echocardiogram pending.  2/25: Hemodynamically stable but another decrease in hemoglobin to 7.1, repeating imaging of right upper extremity.  Patient does not feel any worsening pain or tightness, remained with soft cast and in sling.  Ordered 1 more unit of PRBC.  Improving urinary output and renal function. Echocardiogram without any regional wall motion abnormalities-cardiology is recommending conservative management for now.   Assessment and Plan: * NSTEMI (non-ST elevated myocardial infarction) (HCC) No chest pain with troponin peaked at 1431.   Cardiology is on board-recommending conservative management Echocardiogram without any significant abnormality Patient was started on heparin infusion-significant decrease in hemoglobin and supratherapeutic APTT so it was discontinued.   AKI (acute kidney injury) (HCC) Patient developed AKI with creatinine of 2.15 >>1.46 and BUN of 54.  Normal renal function at baseline.  Renal ultrasound without any significant abnormality.  No significant urinary output after midnight.  May be ATN with hypotension. -Giving some more IV fluid -Monitor renal function -Avoid nephro toxins  Fall Closed right spiral  humeral fracture. The patient has subsequent right humerus fracture. Orthopedic is  recommending conservative management.  Sling was placed.  Concern of bleeding around the fracture site. -Continue with pain management -Continue to monitor for any compartment syndrome   Hyperkalemia Resolved with Lokelma. -Monitor potassium  Acute blood loss anemia (ABLA) Significant decrease in hemoglobin on 2/23 s/p 1 unit, hemoglobin remained stable initially and then another decrease to 7.1 today. Patient was on heparin infusion for supratherapeutic APTT for concern of NSTEMI.  CT abdomen and right humerus was obtained due to worsening right upper extremity swelling and abdominal pain. -CT abdomen was negative for any acute abnormality -Right humeral CT with concern of bleeding around fracture site -Heparin was held -Repeating right upper extremity CT scan, no other obvious bleeding. -Ordered 1 more unit of PRBC -Anemia panel with no significant deficiency, -Monitor hemoglobin -Keep above 8 due to concern of NSTEMI  Essential hypertension Blood pressure currently within goal Concern of NSTEMI and bleeding. -Giving some IV fluid -Holding home metoprolol  Hyponatremia Sodium at 128 today. -Giving some normal saline -Monitor sodium  Lactic acidosis Resolved now.  Likely with NSTEMI and fall resulted in right humeral fracture.   Hypothyroidism - We will continue Synthroid.  Dyslipidemia Lipid profile normal. -Continuing home Zocor  GERD without esophagitis - We will continue PPI therapy   Subjective: Patient was feeling improved when seen today.  Having some right upper extremity pain but denies any worsening tightness or numbness.  Physical Exam: Vitals:   09/04/23 0953 09/04/23 1223 09/04/23 1315 09/04/23 1354  BP: 112/64 (!) 133/59 119/68 138/64  Pulse: 84 93 98 82  Resp:    16  Temp: 98 F (36.7 C)  98 F (36.7 C) 98 F (36.7 C)  TempSrc: Oral  Oral   SpO2: 98% 99% 97% 97%  Weight:      Height:       General.  Frail elderly lady, in no acute  distress. Pulmonary.  Lungs clear bilaterally, normal respiratory effort. CV.  Regular rate and rhythm, no JVD, rub or murmur. Abdomen.  Soft, nontender, nondistended, BS positive. CNS.  Alert and oriented .  No focal neurologic deficit. Extremities.  No edema, no cyanosis, pulses intact and symmetrical.  Right upper extremity in sling Psychiatry.  Judgment and insight appears normal.   Data Reviewed: Prior data reviewed  Family Communication: Discussed with  DIL at bedside  Disposition: Status is: Inpatient Remains inpatient appropriate because: Severity of illness  Planned Discharge Destination:  To be determined  Time spent: 50 minutes  This record has been created using Conservation officer, historic buildings. Errors have been sought and corrected,but may not always be located. Such creation errors do not reflect on the standard of care.   Author: Arnetha Courser, MD 09/04/2023 3:13 PM  For on call review [email or web address].

## 2023-09-04 NOTE — Evaluation (Signed)
 Physical Therapy Evaluation Patient Details Name: Kathleen Paul MRN: [id number] DOB: 09/16/1935 Today's Date: 09/04/2023  History of Present Illness  Kathleen Paul is an 87yoF who comes [facility] on 2/[facility] after fall at home. Noted elevation of troponin as well.   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  Clinical Impression  Pt still receiving blood products, still anemic as per most available labs, session limited by both symptomatic anemia as well as pain with RUE despite splint and sling. Pt very motivated and participatory despite anxiety about moving. Education provided on posturing in bed and future assistance needs during RUE NWB interval- pt not moving extraordinarily well prior to fall. Will follow.       If plan is discharge home, recommend the following: A lot of help with walking and/or transfers;A lot of help with bathing/dressing/bathroom;Assist for transportation;Help with stairs or ramp for entrance   Can travel by private vehicle   No    Equipment Recommendations None recommended by PT  Recommendations for Other Services       Functional Status Assessment Patient has had a recent decline in their functional status and demonstrates the ability to make significant improvements in function in a reasonable and predictable amount of time.     Precautions / Restrictions Precautions Precautions: None Splint/Cast - Date Prophylactic Dressing Applied (if applicable): 09/01/23 Restrictions RUE Weight Bearing Per Provider Order: Non weight bearing      Mobility  Bed Mobility Overal bed mobility: Needs Assistance Bed Mobility: Supine to Sit, Sit to Supine     Supine to sit: Max assist, HOB elevated Sit to supine: Max assist, HOB elevated        Transfers Overall transfer level: Needs assistance Equipment used: 2 person hand held assist Transfers: Sit to/from Stand Sit to  Stand: Mod assist           General transfer comment: flexed posture at baseline    Ambulation/Gait                  Stairs            Wheelchair Mobility     Tilt Bed    Modified Rankin (Stroke Patients Only)       Balance                                             Pertinent Vitals/Pain Pain Assessment Pain Assessment: Faces Faces Pain Scale: Hurts even more Pain Location: RUE with mobility/post mobility. Pain Descriptors / Indicators: Aching, Burning, Constant, Contraction Pain Intervention(s): Limited activity within patient's tolerance, Monitored during session, Premedicated before session    Home Living Family/patient expects to be discharged to:: Private residence Living Arrangements: Children (DTR) Available Help at Discharge: Family;Available PRN/intermittently Type of Home: House Home Access: Ramped entrance       Home Layout: Able to live on main level with bedroom/bathroom;Two level Home Equipment: Rollator (4 wheels);Shower seat Additional Comments: sleeps in recliner    Prior Function Prior Level of Function : Needs assist             Mobility Comments: Mod I using rollator, no falls in past 6 months at least ADLs Comments: Pt has limited bil shoulder AROM, thus daughter assists with lower body ADLs and cleaning face/hair  and with grooming; pt was mod I using the bathroom and would assist with upper body ADLs     Extremity/Trunk Assessment                Communication        Cognition Arousal: Alert Behavior During Therapy: WFL for tasks assessed/performed   PT - Cognitive impairments: No apparent impairments                                 Cueing       General Comments      Exercises Other Exercises Other Exercises: education on supporting RU Eto neutral in bed Other Exercises: heel slides 1x10 bilat Other Exercises: education on floating heels in bed Other Exercises:  A/ROM SAQ 1x10 Rt; Other Exercises: Discussion about failed left knee extensor mechanism.   Assessment/Plan    PT Assessment Patient needs continued PT services  PT Problem List Decreased strength;Decreased range of motion;Decreased activity tolerance;Decreased balance;Decreased mobility;Decreased coordination;Decreased cognition;Decreased knowledge of precautions;Decreased safety awareness;Decreased knowledge of use of DME       PT Treatment Interventions DME instruction;Gait training;Functional mobility training;Therapeutic activities;Therapeutic exercise;Balance training;Patient/family education    PT Goals (Current goals can be found in the Care Plan section)  Acute Rehab PT Goals Patient Stated Goal: be able to DC to home and have care from family PT Goal Formulation: With patient Time For Goal Achievement: 09/18/23 Potential to Achieve Goals: Fair    Frequency 7X/week     Co-evaluation               AM-PAC PT "6 Clicks" Mobility  Outcome Measure Help needed turning from your back to your side while in a flat bed without using bedrails?: A Lot Help needed moving from lying on your back to sitting on the side of a flat bed without using bedrails?: A Lot Help needed moving to and from a bed to a chair (including a wheelchair)?: A Lot Help needed standing up from a chair using your arms (e.g., wheelchair or bedside chair)?: A Lot Help needed to walk in hospital room?: A Lot Help needed climbing 3-5 steps with a railing? : Total 6 Click Score: 11    End of Session   Activity Tolerance: Patient limited by fatigue;Treatment limited secondary to medical complications (Comment);Patient limited by pain Patient left: in bed;with call bell/phone within reach;with family/visitor present (hob at 45 degrees)   PT Visit Diagnosis: Difficulty in walking, not elsewhere classified (R26.2);Other abnormalities of gait and mobility (R26.89);Repeated falls (R29.6);Muscle weakness  (generalized) (M62.81)    Time: 4098-1191 PT Time Calculation (min) (ACUTE ONLY): 15 min   Charges:       PT General Charges $$ ACUTE PT VISIT: 1 Visit        3:57 PM, 09/04/23 Rosamaria Lints, PT, DPT Physical Therapist - [facility]  [phone] (ASCOM)    Mazella Deen C 09/04/2023, 3:54 PM

## 2023-09-04 NOTE — Assessment & Plan Note (Signed)
 Sodium at 128 today. -Giving some normal saline -Monitor sodium

## 2023-09-04 NOTE — Plan of Care (Signed)

## 2023-09-05 DIAGNOSIS — I471 Supraventricular tachycardia, unspecified: Secondary | ICD-10-CM | POA: Diagnosis not present

## 2023-09-05 DIAGNOSIS — W19XXXA Unspecified fall, initial encounter: Secondary | ICD-10-CM | POA: Diagnosis not present

## 2023-09-05 DIAGNOSIS — I214 Non-ST elevation (NSTEMI) myocardial infarction: Secondary | ICD-10-CM | POA: Diagnosis not present

## 2023-09-05 DIAGNOSIS — W19XXXD Unspecified fall, subsequent encounter: Secondary | ICD-10-CM | POA: Diagnosis not present

## 2023-09-05 DIAGNOSIS — S42341A Displaced spiral fracture of shaft of humerus, right arm, initial encounter for closed fracture: Secondary | ICD-10-CM | POA: Diagnosis not present

## 2023-09-05 DIAGNOSIS — R000 Tachycardia, unspecified: Secondary | ICD-10-CM | POA: Diagnosis not present

## 2023-09-05 DIAGNOSIS — S42301A Unspecified fracture of shaft of humerus, right arm, initial encounter for closed fracture: Secondary | ICD-10-CM

## 2023-09-05 DIAGNOSIS — D62 Acute posthemorrhagic anemia: Secondary | ICD-10-CM | POA: Diagnosis not present

## 2023-09-05 LAB — TYPE AND SCREEN
ABO/RH(D): B POS
Antibody Screen: NEGATIVE
Unit division: 0
Unit division: 0

## 2023-09-05 LAB — BASIC METABOLIC PANEL
Anion gap: 7 (ref 5–15)
BUN: 37 mg/dL — ABNORMAL HIGH (ref 8–23)
CO2: 24 mmol/L (ref 22–32)
Calcium: 7.6 mg/dL — ABNORMAL LOW (ref 8.9–10.3)
Chloride: 104 mmol/L (ref 98–111)
Creatinine, Ser: 0.79 mg/dL (ref 0.44–1.00)
GFR, Estimated: >60 mL/min (ref >60)
Glucose, Bld: 116 mg/dL — ABNORMAL HIGH (ref 70–99)
Potassium: 4.2 mmol/L (ref 3.5–5.1)
Sodium: 135 mmol/L (ref 135–145)

## 2023-09-05 LAB — BPAM RBC
Blood Product Expiration Date: 202503192359
Blood Product Expiration Date: 202503182359
ISSUE DATE / TIME: 202502251333
ISSUE DATE / TIME: 202502231543
Unit Type and Rh: 7300
Unit Type and Rh: 7300

## 2023-09-05 LAB — RESP PANEL BY RT-PCR (RSV, FLU A&B, COVID)  RVPGX2
Influenza A by PCR: NEGATIVE
Influenza B by PCR: NEGATIVE
Resp Syncytial Virus by PCR: NEGATIVE
SARS Coronavirus 2 by RT PCR: NEGATIVE

## 2023-09-05 LAB — CBC
HCT: 24.4 % — ABNORMAL LOW (ref 36.0–46.0)
Hemoglobin: 8.3 g/dL — ABNORMAL LOW (ref 12.0–15.0)
MCH: 29 pg (ref 26.0–34.0)
MCHC: 34 g/dL (ref 30.0–36.0)
MCV: 85.3 fL (ref 80.0–100.0)
Platelets: 110 10*3/uL — ABNORMAL LOW (ref 150–400)
RBC: 2.86 MIL/uL — ABNORMAL LOW (ref 3.87–5.11)
RDW: 23.9 % — ABNORMAL HIGH (ref 11.5–15.5)
WBC: 7.5 10*3/uL (ref 4.0–10.5)
nRBC: 0 % (ref 0.0–0.2)

## 2023-09-05 MED ORDER — IPRATROPIUM-ALBUTEROL 0.5-2.5 (3) MG/3ML IN SOLN
3.0000 mL | Freq: Four times a day (QID) | RESPIRATORY_TRACT | Status: DC
  Administered 2023-09-05: 3 mL via RESPIRATORY_TRACT
  Filled 2023-09-05: qty 3

## 2023-09-05 MED ORDER — METOPROLOL TARTRATE 25 MG PO TABS
25.0000 mg | ORAL_TABLET | Freq: Two times a day (BID) | ORAL | Status: DC
  Administered 2023-09-05 – 2023-09-10 (×10): 25 mg via ORAL
  Filled 2023-09-05 (×9): qty 1

## 2023-09-05 MED ORDER — METOPROLOL TARTRATE 25 MG PO TABS
12.5000 mg | ORAL_TABLET | Freq: Two times a day (BID) | ORAL | Status: DC
  Filled 2023-09-05: qty 1

## 2023-09-05 MED ORDER — IPRATROPIUM-ALBUTEROL 0.5-2.5 (3) MG/3ML IN SOLN
3.0000 mL | Freq: Three times a day (TID) | RESPIRATORY_TRACT | Status: AC
  Administered 2023-09-05 – 2023-09-06 (×2): 3 mL via RESPIRATORY_TRACT
  Filled 2023-09-05 (×3): qty 3

## 2023-09-05 NOTE — Progress Notes (Signed)
 Subjective:   Patient is up in a chair and alert and comfortable.  Her daughter is present.  She is having much less pain.  She does have some more swelling distally as expected.  Hemoglobin is 8.3 after second unit of blood yesterday.  She is not in having any numbness or tingling in the hand.  She is moving hand and wrist well and making a good fist.  There is no evidence of any neurovascular compromise.  I readjusted her splint coaptation splint removing any wadded up cotton that might be causing trouble and reapplying the splint.  I will try to get a elastic shoulder immobilizer for her.  The sling is not optimal.  She may leave her hand in her lap if she wishes.    Patient reports pain as mild.  Objective:   VITALS:   Vitals:   09/05/23 0809 09/05/23 1151  BP: 129/85 127/64  Pulse: 83 96  Resp: 18 20  Temp: (!) 97.5 F (36.4 C) 98 F (36.7 C)  SpO2: 100% 97%   There is moderate swelling in the forearm and hand which is not unusual.  There is ecchymosis in the upper arm as expected.  Elbow motion is satisfactory.  The skin is intact.  LABS Recent Labs    09/03/23 0532 09/04/23 0954 09/04/23 1721 09/05/23 0529  HGB 9.1* 7.1* 8.5* 8.3*  HCT 26.2* 20.8* 25.0* 24.4*  WBC 12.0* 9.6  --  7.5  PLT 131* 108*  --  110*    Recent Labs    09/03/23 0532 09/04/23 0830 09/05/23 0529  NA 132* 128* 135  K 5.3* 3.7 4.2  BUN 54* 54* 37*  CREATININE 2.15* 1.46* 0.79  GLUCOSE 133* 115* 116*    No results for input(s): "LABPT", "INR" in the last 72 hours.   Assessment/Plan:      Advance diet Up with therapy Discharge to SNF when stabilized medically. Follow-up in my office 3 to 4 days after discharge. Obtain elastic shoulder immobilizer for the arm.

## 2023-09-05 NOTE — Progress Notes (Addendum)
 Patient's right hand significantly more swollen than yesterday. Fingers cool to the touch, right radial pulse present. Patient denies numbness/tingling in right hand and is able to move fingers. She states "it feels tighter than before". Dr. Nelson Chimes and Dr. Hyacinth Meeker notified and both state they will come to assess it.

## 2023-09-05 NOTE — Plan of Care (Signed)

## 2023-09-05 NOTE — Progress Notes (Signed)
 Rounding Note    Patient Name: Kathleen Paul Date of Encounter: 09/05/2023  [facility] Cardiologist: Dietrich Pates, MD   Subjective   Patient seen on rounds.  Denies any chest pain or shortness of breath.  It was noted on telemetry patient's heart rate was sustained in 180 bpm for approximately 3 minutes.  EKG revealed sinus tachycardia with a rate of 105 with a right bundle branch block and a left anterior fascicular block and a possible lateral infarct.  Inpatient Medications    Scheduled Meds:  aspirin EC  81 mg Oral Daily   calcium carbonate  1 tablet Oral BID WC   gabapentin  100 mg Oral TID   levothyroxine  88 mcg Oral Q0600   metoprolol tartrate  12.5 mg Oral BID   multivitamin with minerals  1 tablet Oral Daily   pantoprazole  40 mg Oral Daily   simvastatin  40 mg Oral q1800   Continuous Infusions:  lactated ringers Stopped (09/02/23 1817)   promethazine (PHENERGAN) injection (IM or IVPB) Stopped (09/02/23 1107)   PRN Meds: acetaminophen, ALPRAZolam, alum & mag hydroxide-simeth, bisacodyl, HYDROcodone-acetaminophen, lactulose, morphine injection, nitroGLYCERIN, ondansetron (ZOFRAN) IV, mouth rinse, polyethylene glycol, promethazine (PHENERGAN) injection (IM or IVPB)   Vital Signs    Vitals:   09/04/23 2356 09/05/23 0412 09/05/23 0809 09/05/23 1151  BP: 131/66 123/70 129/85 127/64  Pulse: 87 92 83 96  Resp: 18 18 18 20   Temp: 97.9 F (36.6 C) 98.7 F (37.1 C) (!) 97.5 F (36.4 C) 98 F (36.7 C)  TempSrc:      SpO2: 99% 100% 100% 97%  Weight:      Height:        Intake/Output Summary (Last 24 hours) at 09/05/2023 1358 Last data filed at 09/05/2023 0405 Gross per 24 hour  Intake 718 ml  Output 500 ml  Net 218 ml      09/01/2023    5:51 PM 12/18/2022    1:21 PM 11/22/2022    2:14 PM  Last 3 Weights  Weight (lbs) 138 lb 150 lb 154 lb  Weight (kg) 62.596 kg 68.04 kg 69.854 kg      Telemetry    Sinus rhythm/sinus tach rates of 90-105 with  occasional PACs and PVCs, 3 minutes of SVT with rates of 180s noted that spontaneously resolved- Personally Reviewed  ECG    EKG revealed sinus tachycardia with a rate of 105 with a right bundle branch block and a left anterior fascicular block and a possible lateral infarct. - Personally Reviewed  Physical Exam   GEN: No acute distress.   Neck: No JVD Cardiac: RRR, no murmurs, rubs, or gallops.  Respiratory: Clear to auscultation bilaterally. GI: Soft, nontender, non-distended  MS: No edema; No deformity. Neuro:  Nonfocal  Psych: Normal affect   Labs    High Sensitivity Troponin:   Recent Labs  Lab 09/01/23 2030 09/02/23 0621 09/02/23 1209 09/03/23 0032 09/03/23 0532  TROPONINIHS 410* 1,150* 1,260* 1,431* 1,150*     Chemistry Recent Labs  Lab 09/03/23 0532 09/04/23 0830 09/05/23 0529  NA 132* 128* 135  K 5.3* 3.7 4.2  CL 101 96* 104  CO2 20* 23 24  GLUCOSE 133* 115* 116*  BUN 54* 54* 37*  CREATININE 2.15* 1.46* 0.79  CALCIUM 8.0* 7.5* 7.6*  ALBUMIN 3.2* 2.7*  --   GFRNONAA 22* 35* >60  ANIONGAP 11 9 7     Lipids  Recent Labs  Lab 09/02/23 0622  CHOL  144  TRIG 57  HDL 68  LDLCALC 65  CHOLHDL 2.1    Hematology Recent Labs  Lab 09/03/23 0532 09/04/23 0954 09/04/23 1721 09/05/23 0529  WBC 12.0* 9.6  --  7.5  RBC 2.95* 2.32*  --  2.86*  HGB 9.1* 7.1* 8.5* 8.3*  HCT 26.2* 20.8* 25.0* 24.4*  MCV 88.8 89.7  --  85.3  MCH 30.8 30.6  --  29.0  MCHC 34.7 34.1  --  34.0  RDW 23.2* 22.7*  --  23.9*  PLT 131* 108*  --  110*   Thyroid No results for input(s): "TSH", "FREET4" in the last 168 hours.  BNPNo results for input(s): "BNP", "PROBNP" in the last 168 hours.  DDimer No results for input(s): "DDIMER" in the last 168 hours.   Radiology    CT HUMERUS RIGHT WO CONTRAST Result Date: 09/04/2023 CLINICAL DATA:  Upper arm trauma still dropping Hgb, any active bleeding EXAM: CT OF THE RIGHT HUMERUS WITHOUT CONTRAST TECHNIQUE: Multidetector CT imaging was  performed according to the standard protocol. Multiplanar CT image reconstructions were also generated. RADIATION DOSE REDUCTION: This exam was performed according to the departmental dose-optimization program which includes automated exposure control, adjustment of the mA and/or kV according to patient size and/or use of iterative reconstruction technique. COMPARISON:  09/02/2023 FINDINGS: Bones/Joint/Cartilage Similar appearance of displaced humeral diaphyseal fracture. Slight decrease in the degree of angulation although fracture remains laterally displaced. Chronic arthropathy of the right shoulder. Intact elbow joint. No new fractures. Ligaments Suboptimally assessed by CT. Muscles and Tendons Similar fullness of the upper arm musculature, most notably the triceps muscle adjacent to the fracture. No well-defined intramuscular hematoma is evident. Soft tissues Degree of soft tissue swelling has progressed compared to prior. Fluid accumulation within the posterior soft tissues overlying the olecranon process of the elbow has also progressed, which could be related to dependent edema as internal density is that of simple fluid. No organized hyperdense collections. IMPRESSION: 1. Similar appearance of displaced humeral diaphyseal fracture. Slight decrease in the degree of angulation although fracture remains laterally displaced. 2. Similar fullness of the upper arm musculature, most notably the triceps muscle adjacent to the fracture. No well-defined intramuscular hematoma is evident. 3. Degree of soft tissue swelling has progressed compared to prior. Fluid accumulation within the posterior soft tissues overlying the olecranon process of the elbow has also progressed, which could be related to dependent edema as internal density is that of simple fluid. No organized hematoma. Electronically Signed   By: Duanne Guess D.O.   On: 09/04/2023 16:03    Cardiac Studies  2D echo 09/03/2023 1. Left ventricular  ejection fraction, by estimation, is 55 to 60%. The  left ventricle has normal function. The left ventricle has no regional  wall motion abnormalities. Left ventricular diastolic parameters are  consistent with Grade I diastolic  dysfunction (impaired relaxation).   2. Right ventricular systolic function is normal. The right ventricular  size is normal. Tricuspid regurgitation signal is inadequate for assessing  PA pressure.   3. The mitral valve is normal in structure. No evidence of mitral valve  regurgitation. No evidence of mitral stenosis.   4. The aortic valve is normal in structure. Aortic valve regurgitation is  trivial. Aortic valve sclerosis/calcification is present, without any  evidence of aortic stenosis.   Patient Profile     88 y.o. female with past medical history of nonobstructive coronary disease, hypertension, hyperlipidemia, diastolic dysfunction, PSVT, TIA, first-degree AV block, intermittent Mobitz 1  heart block, right bundle branch block, anxiety, OA, diverticulosis, esophageal stricture, gastric ulcer, remote history of DVT who is being seen today for the evaluation for tachycardia after she was recently evaluated for elevated troponin in the setting of a fall with right humeral fracture.  Assessment & Plan    Sinus tachycardia/SVT/history of PSVT -Patient with known history of PSVT -PTA metoprolol had been on hold due to bradycardia -Patient sustained 3 minutes of SVT with heart rate of 180 bpm which spontaneously resolved -Metoprolol tartrate 25 mg twice daily was restarted -Continue with telemetry monitoring  Coronary artery disease/demand ischemia -Prior history of 50% mid LAD stenosis on cardiac cath in 2011 -Echocardiogram revealed an LVEF of 55-60%, no RWMA, G1 DD, without valvular abnormalities -EKG showed sinus rhythm with PVCs, PACs, inferior infarct, and some pulm inferior lateral ST depression -High-sensitivity troponin peaked at 1431 -Heparin was  started but later discontinued secondary to anemia -Continued on aspirin and statin therapy -PTA beta-blocker on hold in the setting of history of bradycardia -Not ideal candidate for cardiac catheterization at this time in the setting of anemia and AKI  Right humeral fracture/status post mechanical fall -She was having significant pain -Her swelling is worsened today from yesterday according to nursing staff -Orthopedics was consulted and determined she was not a surgical candidate -Ongoing pain management per IM  Anemia -Hemoglobin 8.3 -H&H dropped with initiation of IV heparin which was discontinued -CT showed concern for bleeding around fracture site of the right humerus -Transfused 1 unit of PRBCs 09/02/2023 -Daily CBC  Acute kidney injury- resolved -Serum creatinine 0.79 -No known history of CKD -Renal ultrasound revealed -Gentle hydration and management ongoing per IM -Monitor urine output -Monitor/trend/replete electrolytes as needed -Avoid nephrotoxic agents were able -Daily BMP  Primary hypertension -Blood pressure 127/64 -PTA enalapril on hold -Restarted on metoprolol twice daily -Vital signs per unit protocol  Hyperlipidemia -LDL 65 -Continued on simvastatin     For questions or updates, please contact [facility] Please consult [email or web address] for contact info under        Signed, Maikel Neisler, NP  09/05/2023, 1:58 PM

## 2023-09-05 NOTE — Consult Note (Signed)
 WOC Nurse Consult Note: Reason for Consult: red left heel  Measurement:see nursing flow sheets Wound bed: no open wound Drainage (amount, consistency, odor) none Periwound: intact  Dressing procedure/placement/frequency: Utilize nursing skin care order set, offload heels and protect with silicone foam dressing.   Re consult if needed, will not follow at this time. Thanks  Liticia Gasior [employer], RN,CWOCN, CNS, CWON-AP [phone])

## 2023-09-05 NOTE — TOC Progression Note (Signed)
 Transition of Care [facility]) - Progression Note    Patient Details  Name: Kathleen Paul MRN: [id number] Date of Birth: 1936/04/26  [facility] CM/SW Contact  Garret Reddish, RN Phone Number: 09/05/2023, 2:55 PM  Clinical Narrative:    Chart reviewed.  Attempted to reach out to patient's daughter to discuss SNF rehab recommendations.  Per PT note family would like to take patient home after discharge. I have left VM for patient's daughter to discuss discharge planning.  TOC will continue to follow for discharge planning.          Expected Discharge Plan and Services                                               Social Determinants of Health (SDOH) Interventions SDOH Screenings   Food Insecurity: No Food Insecurity (09/02/2023)  Housing: Low Risk  (09/02/2023)  Transportation Needs: No Transportation Needs (09/02/2023)  Utilities: Not At Risk (09/02/2023)  Alcohol Screen: Low Risk  (12/18/2022)  Depression (PHQ2-9): Low Risk  (12/18/2022)  Financial Resource Strain: Low Risk  (12/18/2022)  Physical Activity: Inactive (12/18/2022)  Social Connections: Socially Isolated (09/02/2023)  Stress: No Stress Concern Present (12/18/2022)  Tobacco Use: Medium Risk (09/01/2023)    Readmission Risk Interventions     No data to display

## 2023-09-05 NOTE — Progress Notes (Addendum)
 Patient's heart rate sustained in the 180s for approximately 3 minutes. Appears to go in and out of afib/sinus tachycardia. Patient denies any pain or discomfort at this moment. Dr. Nelson Chimes aware. EKG performed and placed in chart per order and Dr. Nelson Chimes made aware EKG is in the chart for review.  EKG reviewed by Dr. Nelson Chimes. She states she will order metoprolol.

## 2023-09-05 NOTE — Progress Notes (Signed)
 Physical Therapy Treatment Patient Details Name: Kathleen Paul MRN: [id number] DOB: [birth year] Today's Date: 09/05/2023   History of Present Illness Kathleen Paul is an 87yoF who comes [facility] on 2/[facility] after fall at home. Noted elevation of troponin as well. Foundtohave Acute mildly displaced fracture of the mid right humeral diaphysis; managed non surgically. 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    PT Comments  Pt was long sitting in bed with supportive daughter (caregiver) present. Pt is very pleasant and motivated. She has notable swelling in RUE/hand. Per family," The MD is coming to look at it today." Chartered loss adjuster adjusted sling to proper fit prior to assisting pt OOB. She stood 1 x EOB with max assist of one + HHA +1. 2nd transfer, pt performed lateral scoot towards L with mod-max assist + vcs for technique. Overall pt tolerated well but remains severely limited overall. DC recs remain appropriate however pt/pt's family are planning to DC directly home. If pt remains unwilling to go to rehab, HHPT would be recommended.     If plan is discharge home, recommend the following: A lot of help with walking and/or transfers;A lot of help with bathing/dressing/bathroom;Assist for transportation;Help with stairs or ramp for entrance   Can travel by private vehicle     No  Equipment Recommendations  Other (comment) (hemiwalker)       Precautions / Restrictions Precautions Required Braces or Orthoses: Splint/Cast Splint/Cast: RUE splint in 90 degrees. Noteable R hand swelling Splint/Cast - Date Prophylactic Dressing Applied (if applicable): 09/01/23 Restrictions Weight Bearing Restrictions Per Provider Order: Yes RUE Weight Bearing Per Provider Order: Non weight bearing     Mobility  Bed Mobility Overal bed mobility: Needs Assistance Bed Mobility: Supine to Sit, Sit to Supine  Supine to sit: Mod  assist, Used rails, HOB elevated  General bed mobility comments: pt sleeps in recliner at baseline    Transfers Overall transfer level: Needs assistance Equipment used: 1 person hand held assist Transfers: Sit to/from Stand Sit to Stand: Max assist, From elevated surface  General transfer comment: pt uses lift chair at baseline.  HHA +1 however encoyuraged pt to use hemiwalker until able to use RW again    Ambulation/Gait  General Gait Details: unable at this time    Balance Overall balance assessment: Needs assistance Sitting-balance support: No upper extremity supported, Feet supported Sitting balance-Leahy Scale: Good Sitting balance - Comments: no difficulty or LOB while seated EOB.   Standing balance support: Single extremity supported Standing balance-Leahy Scale: Poor Standing balance comment: pt is high fall risk. needs constant assistance in standing to prevent LOB       Communication Communication Communication: No apparent difficulties  Cognition Arousal: Alert Behavior During Therapy: WFL for tasks assessed/performed   PT - Cognitive impairments: No apparent impairments    PT - Cognition Comments: Pt is A and O x 4. Supportive daughter present and plans tobring pt home at DC. Following commands: Intact                Pertinent Vitals/Pain Pain Assessment Pain Assessment: 0-10 Pain Score: 6  Pain Location: "all over." Pain Descriptors / Indicators: Discomfort Pain Intervention(s): Limited activity within patient's tolerance, Monitored during session, Premedicated before session, Repositioned     PT Goals (current goals can now be found in the care plan section) Acute Rehab PT Goals Patient Stated Goal: be able to DC  to home and have care from family Progress towards PT goals: Progressing toward goals    Frequency    7X/week       AM-PAC PT "6 Clicks" Mobility   Outcome Measure  Help needed turning from your back to your side while in a  flat bed without using bedrails?: A Lot Help needed moving from lying on your back to sitting on the side of a flat bed without using bedrails?: A Lot Help needed moving to and from a bed to a chair (including a wheelchair)?: A Lot Help needed standing up from a chair using your arms (e.g., wheelchair or bedside chair)?: A Lot Help needed to walk in hospital room?: Total Help needed climbing 3-5 steps with a railing? : Total 6 Click Score: 10    End of Session   Activity Tolerance: Patient limited by fatigue;Treatment limited secondary to medical complications (Comment);Patient limited by pain Patient left: in bed;with call bell/phone within reach;with family/visitor present Nurse Communication: Mobility status PT Visit Diagnosis: Difficulty in walking, not elsewhere classified (R26.2);Other abnormalities of gait and mobility (R26.89);Repeated falls (R29.6);Muscle weakness (generalized) (M62.81)     Time: 9562-1308 PT Time Calculation (min) (ACUTE ONLY): 28 min  Charges:    $Therapeutic Activity: 23-37 mins PT General Charges $$ ACUTE PT VISIT: 1 Visit                    Jetta Lout PTA 09/05/23, 1:34 PM

## 2023-09-05 NOTE — Progress Notes (Signed)
 Progress Note   Patient: Kathleen Paul JXB:[id number] DOB: 24-Jun-1936 DOA: 09/01/2023     4 DOS: the patient was seen and examined on 09/05/2023   Brief hospital course: Taken from H&P.  RASHUNDA PASSON is a 88 y.o. Caucasian female with medical history significant for anxiety, osteoarthritis, diverticulosis, gastric ulcer, history of DVT, dyslipidemia, essential hypertension and nonobstructive coronary artery disease, who presented to the emergency room with acute onset of fall while ambulating with her walker at home after using the bathroom.  She hit her head.  She believes that her knee gave out on her.  No presyncope or syncope.   On presentation patient has borderline bradycardia at 46, blood pressure 149/82, labs with mild hyponatremia at 134 , CO2 of 20, BUN 45, anion gap of 18.  Troponin 25>410.  Coagulation profile was normal.  UA was unremarkable. EKG with NSR, multiple PVCs, right bundle branch block and inferior Q waves CT head with no acute intracranial abnormalities, CT C-spine with advanced degenerative disc and facet disease diffusely with no acute bony abnormality. Right humerus x-ray with acute mildly displaced fracture of the mid right humeral diaphysis, severe chronic arthropathy of the right shoulder.  Cardiology and orthopedic surgery was consulted.,  Started on heparin infusion.  2/23: Vital stable with borderline soft blood pressure at 95/37, labs with significant increase in APTT to above 200, hemoglobin decreased to 8.9 from 11.3 yesterday, BMP with slight worsening of hyponatremia at 131, CO2 16, BUN 40, anion gap of 16, lipid panel normal. Starting on LR, UA with 20 ketones, lactic acid of 2.4. Repeat hemoglobin came back at 8.2.  CT of right upper extremity and abdomen was obtained as she was having abdominal pain to rule out any concealed bleeding, CT abdomen was without any acute abnormality but CT of right humerus with concern of bleeding.  Discussed with  orthopedic surgery and they were just recommending monitoring and blood transfusion with the hope that bleeding will stop.  They are recommending nonsurgical conservative management.  Also discussed with cardiology and heparin infusion was stopped.  Troponin continued to get worse. Ordered 1 unit of PRBC.  2/24: Hemodynamically stable and clinically improving.  Hemoglobin 9.1.  B12 1621.  Lactic acidosis resolved, worsening renal function with creatinine at 2.15 and hyperkalemia with potassium at 5.3.  Patient has not made any urine after midnight.  Giving some IV fluid, renal ultrasound without any significant abnormality.  Troponin peaked at 1431 and now trending down, echocardiogram pending.  2/25: Hemodynamically stable but another decrease in hemoglobin to 7.1, repeating imaging of right upper extremity.  Patient does not feel any worsening pain or tightness, remained with soft cast and in sling.  Ordered 1 more unit of PRBC.  Improving urinary output and renal function. Echocardiogram without any regional wall motion abnormalities-cardiology is recommending conservative management for now.  2/26: Initially stable but did had an episode of sinus tachycardia with heart rate remained in 180s for few minutes, EKG with sinus tachycardia and PVCs, message sent to cardiology to revisit.  Restarting home metoprolol. Patient with worsening right upper extremity edema, weaker pulses and having some tingling, paler fingers, message sent to orthopedic surgery to revisit to rule out compartment syndrome. Hemoglobin today is stable at 8.3, AKI resolved.   Assessment and Plan: * NSTEMI (non-ST elevated myocardial infarction) (HCC) No chest pain with troponin peaked at 1431.   Cardiology is on board-recommending conservative management Echocardiogram without any significant abnormality Patient was started on heparin  infusion-significant decrease in hemoglobin and supratherapeutic APTT so it was  discontinued.   AKI (acute kidney injury) (HCC) Improved. Patient developed AKI with creatinine of 2.15 >>1.46>>0.79 and BUN of 37.  Normal renal function at baseline.  Renal ultrasound without any significant abnormality.  No significant urinary output after midnight.  May be ATN with hypotension. -Monitor renal function -Avoid nephro toxins  Fall Closed right spiral humeral fracture. The patient has subsequent right humerus fracture. Orthopedic is recommending conservative management.  Sling was placed.  Concern of bleeding around the fracture site.  Significant worsening of edema now involving right hand, pulses little weaker than baseline, having some tingling and pallor of fingers. -Another message sent to orthopedic surgery to reevaluate for concern of developing compartment syndrome. -Continue with pain management -Continue to monitor for any compartment syndrome   Hyperkalemia Resolved with Lokelma. -Monitor potassium  Acute blood loss anemia (ABLA) Patient had significant decrease in hemoglobin twice, s/p 2 unit of PRBC.  Hemoglobin today 8.3. Patient was on heparin infusion for supratherapeutic APTT for concern of NSTEMI.  CT abdomen and right humerus was obtained due to worsening right upper extremity swelling and abdominal pain. -CT abdomen was negative for any acute abnormality -Right humeral CT with concern of bleeding around fracture site -Heparin was held -Anemia panel with no significant deficiency, -Monitor hemoglobin -Keep above 8 due to concern of NSTEMI  Essential hypertension Blood pressure currently within goal Concern of NSTEMI and bleeding. -Restarting home metoprolol-patient did develop transient sinus tachycardia -Cardiology is on board  Hyponatremia Resolved -Monitor sodium  Lactic acidosis Resolved now.  Likely with NSTEMI and fall resulted in right humeral fracture.   Hypothyroidism - We will continue Synthroid.  Dyslipidemia Lipid  profile normal. -Continuing home Zocor  GERD without esophagitis - We will continue PPI therapy   Subjective: Patient was having more right upper extremity pain and swelling.  Having mild tingling of hand.  Physical Exam: Vitals:   09/04/23 2356 09/05/23 0412 09/05/23 0809 09/05/23 1151  BP: 131/66 123/70 129/85 127/64  Pulse: 87 92 83 96  Resp: 18 18 18 20   Temp: 97.9 F (36.6 C) 98.7 F (37.1 C) (!) 97.5 F (36.4 C) 98 F (36.7 C)  TempSrc:      SpO2: 99% 100% 100% 97%  Weight:      Height:       General.  Frail elderly lady, in no acute distress. Pulmonary.  Lungs clear bilaterally, normal respiratory effort. CV.  Regular rate and rhythm, no JVD, rub or murmur. Abdomen.  Soft, nontender, nondistended, BS positive. CNS.  Alert and oriented .  No focal neurologic deficit. Extremities.  Significant edema of right upper extremity, involving hand, right radial pulse little weaker than before, fingers were significantly paler. Psychiatry.  Judgment and insight appears normal.   Data Reviewed: Prior data reviewed  Family Communication: Discussed with  DIL at bedside  Disposition: Status is: Inpatient Remains inpatient appropriate because: Severity of illness  Planned Discharge Destination:  To be determined  Time spent: 50 minutes  This record has been created using Conservation officer, historic buildings. Errors have been sought and corrected,but may not always be located. Such creation errors do not reflect on the standard of care.   Author: Arnetha Courser, MD 09/05/2023 2:21 PM  For on call review [email or web address].

## 2023-09-06 DIAGNOSIS — E875 Hyperkalemia: Secondary | ICD-10-CM

## 2023-09-06 DIAGNOSIS — I10 Essential (primary) hypertension: Secondary | ICD-10-CM | POA: Diagnosis not present

## 2023-09-06 DIAGNOSIS — E871 Hypo-osmolality and hyponatremia: Secondary | ICD-10-CM

## 2023-09-06 DIAGNOSIS — N179 Acute kidney failure, unspecified: Secondary | ICD-10-CM

## 2023-09-06 DIAGNOSIS — I214 Non-ST elevation (NSTEMI) myocardial infarction: Secondary | ICD-10-CM | POA: Diagnosis not present

## 2023-09-06 DIAGNOSIS — S42301A Unspecified fracture of shaft of humerus, right arm, initial encounter for closed fracture: Secondary | ICD-10-CM | POA: Diagnosis not present

## 2023-09-06 DIAGNOSIS — E872 Acidosis, unspecified: Secondary | ICD-10-CM

## 2023-09-06 DIAGNOSIS — R531 Weakness: Secondary | ICD-10-CM

## 2023-09-06 DIAGNOSIS — I471 Supraventricular tachycardia, unspecified: Secondary | ICD-10-CM | POA: Diagnosis not present

## 2023-09-06 DIAGNOSIS — W19XXXS Unspecified fall, sequela: Secondary | ICD-10-CM

## 2023-09-06 DIAGNOSIS — D62 Acute posthemorrhagic anemia: Secondary | ICD-10-CM | POA: Diagnosis not present

## 2023-09-06 LAB — PREPARE RBC (CROSSMATCH)

## 2023-09-06 LAB — BASIC METABOLIC PANEL
Anion gap: 7 (ref 5–15)
BUN: 29 mg/dL — ABNORMAL HIGH (ref 8–23)
CO2: 23 mmol/L (ref 22–32)
Calcium: 7.6 mg/dL — ABNORMAL LOW (ref 8.9–10.3)
Chloride: 102 mmol/L (ref 98–111)
Creatinine, Ser: 0.94 mg/dL (ref 0.44–1.00)
GFR, Estimated: 59 mL/min — ABNORMAL LOW (ref >60)
Glucose, Bld: 112 mg/dL — ABNORMAL HIGH (ref 70–99)
Potassium: 5 mmol/L (ref 3.5–5.1)
Sodium: 132 mmol/L — ABNORMAL LOW (ref 135–145)

## 2023-09-06 LAB — CBC
HCT: 22.3 % — ABNORMAL LOW (ref 36.0–46.0)
Hemoglobin: 7.4 g/dL — ABNORMAL LOW (ref 12.0–15.0)
MCH: 28.8 pg (ref 26.0–34.0)
MCHC: 33.2 g/dL (ref 30.0–36.0)
MCV: 86.8 fL (ref 80.0–100.0)
Platelets: 113 10*3/uL — ABNORMAL LOW (ref 150–400)
RBC: 2.57 MIL/uL — ABNORMAL LOW (ref 3.87–5.11)
RDW: 23.6 % — ABNORMAL HIGH (ref 11.5–15.5)
WBC: 5.4 10*3/uL (ref 4.0–10.5)
nRBC: 0 % (ref 0.0–0.2)

## 2023-09-06 MED ORDER — SODIUM CHLORIDE 0.9% IV SOLUTION: Freq: Once | INTRAVENOUS | Status: AC

## 2023-09-06 NOTE — Assessment & Plan Note (Signed)
 Continue PPI therapy.

## 2023-09-06 NOTE — Progress Notes (Signed)
 Subjective:   Less pain today.  Dressings are appropriate.  Some edema in the hand and forearm.  Skin is intact. Agree with blood transfusion.  Hopefully this will slow down now that she is just on aspirin.    Patient reports pain as mild.  Objective:   VITALS:   Vitals:   09/06/23 1640 09/06/23 1645  BP: (!) 168/112 121/89  Pulse: 67   Resp: 16 10  Temp: 98.8 F (37.1 C)   SpO2: 100%     Neurologically intact  LABS Recent Labs    09/04/23 0954 09/04/23 1721 09/05/23 0529 09/06/23 0414  HGB 7.1* 8.5* 8.3* 7.4*  HCT 20.8* 25.0* 24.4* 22.3*  WBC 9.6  --  7.5 5.4  PLT 108*  --  110* 113*    Recent Labs    09/04/23 0830 09/05/23 0529 09/06/23 0414  NA 128* 135 132*  K 3.7 4.2 5.0  BUN 54* 37* 29*  CREATININE 1.46* 0.79 0.94  GLUCOSE 115* 116* 112*    No results for input(s): "LABPT", "INR" in the last 72 hours.   Assessment/Plan:      Advance diet Up with therapy

## 2023-09-06 NOTE — TOC Progression Note (Signed)
 Transition of Care [facility]) - Progression Note    Patient Details  Name: Kathleen Paul MRN: [id number] Date of Birth: Jul 15, 1935  Transition of Care [facility]) CM/SW Contact  Truddie Hidden, RN Phone Number: 09/06/2023, 2:25 PM  Clinical Narrative:    Spoke with patient's daughter, [name]. She would like time to consider SNF placement.          Expected Discharge Plan and Services                                               Social Determinants of Health (SDOH) Interventions SDOH Screenings   Food Insecurity: No Food Insecurity (09/02/2023)  Housing: Low Risk  (09/02/2023)  Transportation Needs: No Transportation Needs (09/02/2023)  Utilities: Not At Risk (09/02/2023)  Alcohol Screen: Low Risk  (12/18/2022)  Depression (PHQ2-9): Low Risk  (12/18/2022)  Financial Resource Strain: Low Risk  (12/18/2022)  Physical Activity: Inactive (12/18/2022)  Social Connections: Socially Isolated (09/02/2023)  Stress: No Stress Concern Present (12/18/2022)  Tobacco Use: Medium Risk (09/01/2023)    Readmission Risk Interventions     No data to display

## 2023-09-06 NOTE — Progress Notes (Signed)
 Progress Note   Patient: Kathleen Paul WUX:[id number] DOB: 11/30/1935 DOA: 09/01/2023     5 DOS: the patient was seen and examined on 09/06/2023   Brief hospital course: Taken from H&P.  Kathleen Paul is a 88 y.o. Caucasian female with medical history significant for anxiety, osteoarthritis, diverticulosis, gastric ulcer, history of DVT, dyslipidemia, essential hypertension and nonobstructive coronary artery disease, who presented to the emergency room with acute onset of fall while ambulating with her walker at home after using the bathroom.  She hit her head.  She believes that her knee gave out on her.  No presyncope or syncope.   On presentation patient has borderline bradycardia at 46, blood pressure 149/82, labs with mild hyponatremia at 134 , CO2 of 20, BUN 45, anion gap of 18.  Troponin 25>410.  Coagulation profile was normal.  UA was unremarkable. EKG with NSR, multiple PVCs, right bundle branch block and inferior Q waves CT head with no acute intracranial abnormalities, CT C-spine with advanced degenerative disc and facet disease diffusely with no acute bony abnormality. Right humerus x-ray with acute mildly displaced fracture of the mid right humeral diaphysis, severe chronic arthropathy of the right shoulder.  Cardiology and orthopedic surgery was consulted.,  Started on heparin infusion.  2/23: Vital stable with borderline soft blood pressure at 95/37, labs with significant increase in APTT to above 200, hemoglobin decreased to 8.9 from 11.3 yesterday, BMP with slight worsening of hyponatremia at 131, CO2 16, BUN 40, anion gap of 16, lipid panel normal. Starting on LR, UA with 20 ketones, lactic acid of 2.4. Repeat hemoglobin came back at 8.2.  CT of right upper extremity and abdomen was obtained as she was having abdominal pain to rule out any concealed bleeding, CT abdomen was without any acute abnormality but CT of right humerus with concern of bleeding.  Discussed with  orthopedic surgery and they were just recommending monitoring and blood transfusion with the hope that bleeding will stop.  They are recommending nonsurgical conservative management.  Also discussed with cardiology and heparin infusion was stopped.  Troponin continued to get worse. Ordered 1 unit of PRBC.  2/24: Hemodynamically stable and clinically improving.  Hemoglobin 9.1.  B12 1621.  Lactic acidosis resolved, worsening renal function with creatinine at 2.15 and hyperkalemia with potassium at 5.3.  Patient has not made any urine after midnight.  Giving some IV fluid, renal ultrasound without any significant abnormality.  Troponin peaked at 1431 and now trending down, echocardiogram pending.  2/25: Hemodynamically stable but another decrease in hemoglobin to 7.1, repeating imaging of right upper extremity.  Patient does not feel any worsening pain or tightness, remained with soft cast and in sling.  Ordered 1 more unit of PRBC.  Improving urinary output and renal function. Echocardiogram without any regional wall motion abnormalities-cardiology is recommending conservative management for now.  2/26: Initially stable but did had an episode of sinus tachycardia with heart rate remained in 180s for few minutes, EKG with sinus tachycardia and PVCs.  Patient back on metoprolol.  Orthopedic reevaluated. 2/27.  Hemoglobin drifted down to 7.4 will give another unit of blood.  Assessment and Plan: * Acute blood loss anemia (ABLA) Patient's hemoglobin drifted down to 7.4 today we will give another unit of blood.  Patient received 2 units of packed red blood cells prior.  Close watching of hemoglobin.  Patient does have bruising on the right arm.  NSTEMI (non-ST elevated myocardial infarction) (HCC) No chest pain with troponin peaked  at 1431.  Cardiology recommended conservative management.  Patient on aspirin, metoprolol, simvastatin.   AKI (acute kidney injury) (HCC) Creatinine worsened to 2.15 on  2/24 and the last creatinine down to 0.94.  Fall Closed right spiral humeral fracture. Orthopedic surgery recommending conservative management.  Hyperkalemia Needed Lokelma during the hospital course  Essential hypertension Patient on metoprolol  Hyponatremia Sodium 132 today  Lactic acidosis Resolved now.  Likely with NSTEMI and fall resulting in right humeral fracture.   Hypothyroidism Continue Synthroid.  Dyslipidemia Lipid profile normal. -Continuing home Zocor  PSVT (paroxysmal supraventricular tachycardia) (HCC) Patient back on metoprolol.  GERD without esophagitis Continue PPI therapy  Generalized weakness Physical therapy recommending rehab but patient determined to go home.  Only walked 3 feet today.        Subjective: Patient determined to go home rather than through rehab.  Only walked 3 feet today.  Since hemoglobin drifted down to 7.4 I will give a unit of blood.  Found to have a right humeral fracture after a fall and admitted also with NSTEMI.  Physical Exam: Vitals:   09/06/23 0032 09/06/23 0353 09/06/23 0811 09/06/23 1140  BP:  136/75  (!) 124/52  Pulse: 75 63  65  Resp: 16 20  18   Temp:  98.5 F (36.9 C)  97.9 F (36.6 C)  TempSrc:      SpO2:  97% 98% 100%  Weight:      Height:       Physical Exam HENT:     Head: Normocephalic.     Mouth/Throat:     Pharynx: No oropharyngeal exudate.  Eyes:     General: Lids are normal.     Conjunctiva/sclera: Conjunctivae normal.  Cardiovascular:     Rate and Rhythm: Normal rate and regular rhythm.     Heart sounds: Normal heart sounds, S1 normal and S2 normal.  Pulmonary:     Breath sounds: Examination of the right-lower field reveals decreased breath sounds. Examination of the left-lower field reveals decreased breath sounds. Decreased breath sounds present. No wheezing, rhonchi or rales.  Abdominal:     Palpations: Abdomen is soft.     Tenderness: There is no abdominal tenderness.   Musculoskeletal:     Right lower leg: Swelling present.     Left lower leg: Swelling present.  Skin:    General: Skin is warm.     Comments: Bruising right arm.  Neurological:     Mental Status: She is alert and oriented to person, place, and time.     Data Reviewed: Hemoglobin 7.4, platelet count 113, creatinine 0.94, sodium 132  Family Communication: Updated daughter on the phone  Disposition: Status is: Inpatient Remains inpatient appropriate because: Patient determined to go home with home health rather than through rehab.  Only walked 3 feet today.  Will give a unit of blood on hemoglobin 7.4.  Planned Discharge Destination: Home with Home Health    Time spent: 28 minutes  Author: Alford Highland, MD 09/06/2023 12:36 PM  For on call review [email or web address].

## 2023-09-06 NOTE — Assessment & Plan Note (Signed)
 Continue Synthroid

## 2023-09-06 NOTE — Progress Notes (Signed)
 Rounding Note    Patient Name: Kathleen Paul Date of Encounter: 09/06/2023  [facility] Cardiologist: Dietrich Pates, MD   Subjective   Patient seen on a.m. rounds.  Denies any chest pain or shortness of breath.  States that she has an increased breath of anxiety this morning she did not rest well last evening.   Inpatient Medications    Scheduled Meds:  aspirin EC  81 mg Oral Daily   calcium carbonate  1 tablet Oral BID WC   gabapentin  100 mg Oral TID   ipratropium-albuterol  3 mL Nebulization TID   levothyroxine  88 mcg Oral Q0600   metoprolol tartrate  25 mg Oral BID   multivitamin with minerals  1 tablet Oral Daily   pantoprazole  40 mg Oral Daily   simvastatin  40 mg Oral q1800   Continuous Infusions:  lactated ringers Stopped (09/02/23 1817)   promethazine (PHENERGAN) injection (IM or IVPB) Stopped (09/02/23 1107)   PRN Meds: acetaminophen, ALPRAZolam, alum & mag hydroxide-simeth, bisacodyl, HYDROcodone-acetaminophen, lactulose, morphine injection, nitroGLYCERIN, ondansetron (ZOFRAN) IV, mouth rinse, polyethylene glycol, promethazine (PHENERGAN) injection (IM or IVPB)   Vital Signs    Vitals:   09/05/23 2335 09/06/23 0000 09/06/23 0032 09/06/23 0353  BP: 99/83   136/75  Pulse: (!) 147 84 75 63  Resp: 20  16 20   Temp: 98.6 F (37 C)   98.5 F (36.9 C)  TempSrc:      SpO2: 100%   97%  Weight:      Height:        Intake/Output Summary (Last 24 hours) at 09/06/2023 0745 Last data filed at 09/05/2023 1948 Gross per 24 hour  Intake --  Output 1000 ml  Net -1000 ml      09/01/2023    5:51 PM 12/18/2022    1:21 PM 11/22/2022    2:14 PM  Last 3 Weights  Weight (lbs) 138 lb 150 lb 154 lb  Weight (kg) 62.596 kg 68.04 kg 69.854 kg      Telemetry    Sinus rhythm at 60-70 with occasional unifocal PVCs- Personally Reviewed  ECG    No new tracings- Personally Reviewed  Physical Exam   GEN: No acute distress.   Neck: No JVD Cardiac: RRR, no  murmurs, rubs, or gallops.  Respiratory: Clear to auscultation bilaterally. GI: Soft, nontender, non-distended  MS: No BLE edema; No deformity.  Right upper extremity remains wrapped with mild edema noted in her right hand Neuro:  Nonfocal  Psych: Normal affect   Labs    High Sensitivity Troponin:   Recent Labs  Lab 09/01/23 2030 09/02/23 0621 09/02/23 1209 09/03/23 0032 09/03/23 0532  TROPONINIHS 410* 1,150* 1,260* 1,431* 1,150*     Chemistry Recent Labs  Lab 09/03/23 0532 09/04/23 0830 09/05/23 0529 09/06/23 0414  NA 132* 128* 135 132*  K 5.3* 3.7 4.2 5.0  CL 101 96* 104 102  CO2 20* 23 24 23   GLUCOSE 133* 115* 116* 112*  BUN 54* 54* 37* 29*  CREATININE 2.15* 1.46* 0.79 0.94  CALCIUM 8.0* 7.5* 7.6* 7.6*  ALBUMIN 3.2* 2.7*  --   --   GFRNONAA 22* 35* >60 59*  ANIONGAP 11 9 7 7     Lipids  Recent Labs  Lab 09/02/23 0622  CHOL 144  TRIG 57  HDL 68  LDLCALC 65  CHOLHDL 2.1    Hematology Recent Labs  Lab 09/04/23 0954 09/04/23 1721 09/05/23 0529 09/06/23 0414  WBC 9.6  --  7.5 5.4  RBC 2.32*  --  2.86* 2.57*  HGB 7.1* 8.5* 8.3* 7.4*  HCT 20.8* 25.0* 24.4* 22.3*  MCV 89.7  --  85.3 86.8  MCH 30.6  --  29.0 28.8  MCHC 34.1  --  34.0 33.2  RDW 22.7*  --  23.9* 23.6*  PLT 108*  --  110* 113*   Thyroid No results for input(s): "TSH", "FREET4" in the last 168 hours.  BNPNo results for input(s): "BNP", "PROBNP" in the last 168 hours.  DDimer No results for input(s): "DDIMER" in the last 168 hours.   Radiology    CT HUMERUS RIGHT WO CONTRAST Result Date: 09/04/2023 CLINICAL DATA:  Upper arm trauma still dropping Hgb, any active bleeding EXAM: CT OF THE RIGHT HUMERUS WITHOUT CONTRAST TECHNIQUE: Multidetector CT imaging was performed according to the standard protocol. Multiplanar CT image reconstructions were also generated. RADIATION DOSE REDUCTION: This exam was performed according to the departmental dose-optimization program which includes automated  exposure control, adjustment of the mA and/or kV according to patient size and/or use of iterative reconstruction technique. COMPARISON:  09/02/2023 FINDINGS: Bones/Joint/Cartilage Similar appearance of displaced humeral diaphyseal fracture. Slight decrease in the degree of angulation although fracture remains laterally displaced. Chronic arthropathy of the right shoulder. Intact elbow joint. No new fractures. Ligaments Suboptimally assessed by CT. Muscles and Tendons Similar fullness of the upper arm musculature, most notably the triceps muscle adjacent to the fracture. No well-defined intramuscular hematoma is evident. Soft tissues Degree of soft tissue swelling has progressed compared to prior. Fluid accumulation within the posterior soft tissues overlying the olecranon process of the elbow has also progressed, which could be related to dependent edema as internal density is that of simple fluid. No organized hyperdense collections. IMPRESSION: 1. Similar appearance of displaced humeral diaphyseal fracture. Slight decrease in the degree of angulation although fracture remains laterally displaced. 2. Similar fullness of the upper arm musculature, most notably the triceps muscle adjacent to the fracture. No well-defined intramuscular hematoma is evident. 3. Degree of soft tissue swelling has progressed compared to prior. Fluid accumulation within the posterior soft tissues overlying the olecranon process of the elbow has also progressed, which could be related to dependent edema as internal density is that of simple fluid. No organized hematoma. Electronically Signed   By: Duanne Guess D.O.   On: 09/04/2023 16:03    Cardiac Studies   2D echo 09/03/2023 1. Left ventricular ejection fraction, by estimation, is 55 to 60%. The  left ventricle has normal function. The left ventricle has no regional  wall motion abnormalities. Left ventricular diastolic parameters are  consistent with Grade I diastolic   dysfunction (impaired relaxation).   2. Right ventricular systolic function is normal. The right ventricular  size is normal. Tricuspid regurgitation signal is inadequate for assessing  PA pressure.   3. The mitral valve is normal in structure. No evidence of mitral valve  regurgitation. No evidence of mitral stenosis.   4. The aortic valve is normal in structure. Aortic valve regurgitation is  trivial. Aortic valve sclerosis/calcification is present, without any  evidence of aortic stenosis.   Patient Profile     88 y.o. female with a past medical history of nonobstructive coronary artery disease, hypertension, hyperlipidemia, diastolic dysfunction, PSVT, TIA, first-degree block, intermittent Mobitz 1 heart block, chronic right bundle branch block, anxiety, OA, diverticulitis, esophageal stricture, gastric ulcer, remote history of DVT has been seen and evaluated before episodes of PSVT that lasted for 3 minutes and  previously been evaluated for elevated troponin in the setting of a fall with a right humeral fracture.  Assessment & Plan    Sinus tachycardia/SVT/PSVT -Patient with known history of PSVT -PTA metoprolol been on hold due to bradycardia -Continued on metoprolol tartrate 25 mg twice daily -No recurrent episodes PSVT noted on telemetry overnight -Continued on telemetry monitoring at this time  Coronary artery disease/demand ischemia -Prior history of 50% mid LAD stenosis on cardiac cath in 2011 -Echocardiogram reveals an EF of 55 to 60%, no RWMA, G1 DD, without valvular abnormalities -High-sensitivity troponin peaked at 1431 -Initiated on heparin infusion had to be stopped secondary to anemia -Continued on aspirin 81 mg daily and statin therapy -Continued on metoprolol titrate 25 mg twice daily -Not an ideal candidate for cardiac catheterization at this time the setting of anemia and resolved AKI -Continues to remain chest pain-free  Right humeral fracture status post  mechanical fall -Pain has improved -Swelling is stable to the right hand -Orthopedics determined she was not a good surgical candidate and she has had different slings and wraps to that arm -Ongoing pain management per IM  Anemia -Hemoglobin 7.4 -Transfuse 1 unit of PRBCs on 09/02/2023 -Recommend transfusing to keep hemoglobin 8 or better -Daily CBC  Acute kidney injury-resolved -Serum creatinine 0.94 -No known history of CKD -Renal ultrasound completed and was unrevealing -Continue to monitor urine output -Monitor/trend/replete electrolytes as needed -Avoid nephrotoxic agents were able -Daily BMP  Primary hypertension -Blood pressure 136/75 -PTA enalapril remains on hold -Continue metoprolol tartrate 25 mg twice daily -Vital signs per unit protocol  Hyperlipidemia -Continued on simvastatin     For questions or updates, please contact [facility] Please consult [email or web address] for contact info under        Signed, Yandel Zeiner, NP  09/06/2023, 7:45 AM

## 2023-09-06 NOTE — Assessment & Plan Note (Signed)
 Physical therapy recommending rehab but patient determined to go home.  Today was max assist with physical therapy.

## 2023-09-06 NOTE — Progress Notes (Signed)
 Physical Therapy Treatment Patient Details Name: Kathleen Paul MRN: [id number] DOB: 1936/04/22 Today's Date: 09/06/2023   History of Present Illness Kathleen Paul is an 87yoF who comes [facility] 2/[facility] after fall at home. Noted elevation of troponin as well. Foundtohave Acute mildly displaced fracture of the mid right humeral diaphysis; managed non surgically. 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    PT Comments  Pt was long sitting in bed upon arrival. She is alert and oriented but very frustrated with "lack of care" over past 15 hours. Pt frustrated and voices throughout session her frustration. Pt had untouched breakfast tray out of reach with pt c/o not being checked on throughout the night. Author assisted pt OOB to recliner with mod-max of one with increased time and Vcs. Pt has shoulder immobilizer in room however was unwilling to wear it due to comfort. Pt remains a high fall risk. Encourage RN staff have +2 assistance and only perform stand pivot towards L. DC recs remain appropriate however pt continues to plan to DC directly home once cleared medically. Pt will need hemiwalker prior to DC. Acute PT will continue to follow and progress per current POC progressing as able per pt tolerance.    If plan is discharge home, recommend the following: A lot of help with walking and/or transfers;A lot of help with bathing/dressing/bathroom;Assist for transportation;Help with stairs or ramp for entrance     Equipment Recommendations  Other (comment) (hemiwalker)       Precautions / Restrictions Precautions Precautions: None Required Braces or Orthoses: Other Brace (shoulder immobilizer/ attached to abdominal binder. (pt refused to use this session) Splint/Cast: RUE splint in 90 degrees. Noteable R hand swelling Restrictions Weight Bearing Restrictions Per Provider Order: Yes RUE Weight Bearing Per  Provider Order: Non weight bearing     Mobility  Bed Mobility Overal bed mobility: Needs Assistance Bed Mobility: Supine to Sit  Supine to sit: HOB elevated, Mod assist  General bed mobility comments: Mod assist to achieve EOB sitting. Vcs and tcs throughout    Transfers Overall transfer level: Needs assistance Equipment used: 1 person hand held assist Transfers: Bed to chair/wheelchair/BSC Sit to Stand: Mod assist, Max assist, From elevated surface  General transfer comment: pt did stand and take a few shuffling steps to pivot to recliner. Vcs for step by step sequencing. still requires significant assistance to safely get to recliner.    Ambulation/Gait Ambulation/Gait assistance: Mod assist, Max assist Gait Distance (Feet): 3 Feet Assistive device: 1 person hand held assist Gait Pattern/deviations: Step-to pattern Gait velocity: decreased  General Gait Details: pt did take a few steps form EOB to recliner with +1 HHA   Balance Overall balance assessment: Needs assistance Sitting-balance support: No upper extremity supported, Feet supported Sitting balance-Leahy Scale: Good     Standing balance support: Single extremity supported, During functional activity Standing balance-Leahy Scale: Poor       Communication Communication Communication: No apparent difficulties  Cognition Arousal: Alert Behavior During Therapy: WFL for tasks assessed/performed   PT - Cognitive impairments: No apparent impairments    PT - Cognition Comments: Pt is A and O but very frustrated throughout session. tremors noted today that were not observed previous date. pt endorses tremors are from being so mad. Following commands: Intact      Cueing Cueing Techniques: Verbal cues, Tactile cues     General Comments General comments (  skin integrity, edema, etc.): Author assisted pt with repositioning in bed, setting up breakfast. getting ice pack for swelling in RUE, and reviewing importance of  ther ex throughout the day.      Pertinent Vitals/Pain Pain Assessment Pain Assessment: 0-10 Pain Score: 7  Pain Location: My arm and legs Pain Descriptors / Indicators: Discomfort Pain Intervention(s): Limited activity within patient's tolerance, Monitored during session, Repositioned, Ice applied     PT Goals (current goals can now be found in the care plan section) Acute Rehab PT Goals Patient Stated Goal: " I need to get out of here before they kill me." Progress towards PT goals: Not progressing toward goals - comment    Frequency    7X/week       AM-PAC PT "6 Clicks" Mobility   Outcome Measure  Help needed turning from your back to your side while in a flat bed without using bedrails?: A Lot Help needed moving from lying on your back to sitting on the side of a flat bed without using bedrails?: A Lot Help needed moving to and from a bed to a chair (including a wheelchair)?: A Lot Help needed standing up from a chair using your arms (e.g., wheelchair or bedside chair)?: A Lot Help needed to walk in hospital room?: A Lot Help needed climbing 3-5 steps with a railing? : Total 6 Click Score: 11    End of Session   Activity Tolerance: Patient limited by pain;Patient limited by fatigue Patient left: in chair;with call bell/phone within reach;with chair alarm set Nurse Communication: Mobility status PT Visit Diagnosis: Difficulty in walking, not elsewhere classified (R26.2);Other abnormalities of gait and mobility (R26.89);Repeated falls (R29.6);Muscle weakness (generalized) (M62.81)     Time: 1308-6578 PT Time Calculation (min) (ACUTE ONLY): 16 min  Charges:    $Therapeutic Activity: 8-22 mins PT General Charges $$ ACUTE PT VISIT: 1 Visit                     Jetta Lout PTA 09/06/23, 9:26 AM

## 2023-09-06 NOTE — Assessment & Plan Note (Signed)
 Lipid profile normal. -Continuing home Zocor

## 2023-09-06 NOTE — Progress Notes (Signed)
 Spoke with Johny Shears (pt daughter) who shared that [name] (daughter) has Covid and the siblings (including Jonny Ruiz) has requested that if needed, Jonny Ruiz be called 1st and [name] be called 2nd.

## 2023-09-06 NOTE — Progress Notes (Signed)
 Occupational Therapy Treatment Patient Details Name: Kathleen Paul MRN: [id number] DOB: 28-Jan-1936 Today's Date: 09/06/2023   History of present illness Kathleen Paul is an 87yoF who comes [facility] after fall at home. Noted elevation of troponin as well. Foundtohave Acute mildly displaced fracture of the mid right humeral diaphysis; managed non surgically. 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   OT comments  Pt seen for OT tx, granddaughter in room during session. RN providing pain meds for c/o RUE discomfort and HA, pain 7/10 start and 6/10 end of session. Pt performs oral care seated in recliner with minA. R shoulder immobilizer in room, pt requests to trial at different time when performing mobility. Educated pt on edema control and joint protection strategies. Discharge recommendation remains appropriate. Pt plans to return home at d/c - if going home, pt will need +2 assist for transfers. Patient will benefit from continued inpatient follow up therapy, <3 hours/day       If plan is discharge home, recommend the following:  Two people to help with walking and/or transfers;Two people to help with bathing/dressing/bathroom;Help with stairs or ramp for entrance   Equipment Recommendations  BSC/3in1;Wheelchair (measurements OT)       Precautions / Restrictions Precautions Precautions: None Required Braces or Orthoses: Other Brace (shld immobilizer (pt does not wish to don this session)) Splint/Cast: RUE splint in 90 degrees. Noteable R hand swelling Splint/Cast - Date Prophylactic Dressing Applied (if applicable): 09/01/23 Restrictions Weight Bearing Restrictions Per Provider Order: Yes RUE Weight Bearing Per Provider Order: Non weight bearing       Mobility Bed Mobility               General bed mobility comments: not assessed, pt requests to remain in recliner     Transfers Overall transfer level: Needs assistance                 General transfer comment: NT, pt requests to work on oral care     Balance Overall balance assessment: Needs assistance Sitting-balance support: No upper extremity supported, Feet supported Sitting balance-Leahy Scale: Good                                     ADL either performed or assessed with clinical judgement   ADL Overall ADL's : Needs assistance/impaired     Grooming: Oral care;Minimal assistance;Sitting Grooming Details (indicate cue type and reason): minA for object manipulation due to decreased bilateral coordination, pt needs assist for opening toothpaste, mouthwash             Lower Body Dressing: Maximal assistance Lower Body Dressing Details (indicate cue type and reason): maxA to don shoes seated in recliner               General ADL Comments: Adjusted RUE for edema control, ice reapplied end of session, discussed joint protection strategies    Communication Communication Communication: No apparent difficulties   Cognition Arousal: Alert Behavior During Therapy: WFL for tasks assessed/performed Cognition: No apparent impairments                               Following commands: Intact        Cueing   Cueing Techniques: Verbal cues, Tactile cues  General Comments Author assisted pt with repositioning in bed, setting up breakfast. getting ice pack for swelling in RUE, and reviewing importance of ther ex throughout the day.    Pertinent Vitals/ Pain       Pain Assessment Pain Assessment: 0-10 Pain Score: 7  (6 end of sessoin) Pain Location: Arm, HA Pain Descriptors / Indicators: Headache, Discomfort, Dull Pain Intervention(s): Limited activity within patient's tolerance, RN gave pain meds during session, Monitored during session, Ice applied         Frequency  Min 1X/week        Progress Toward Goals  OT Goals(current  goals can now be found in the care plan section)  Progress towards OT goals: Progressing toward goals  Acute Rehab OT Goals OT Goal Formulation: With patient/family Time For Goal Achievement: 09/18/23 Potential to Achieve Goals: Good ADL Goals Pt Will Perform Grooming: with min assist;sitting Pt Will Perform Lower Body Dressing: with min assist;sit to/from stand;with caregiver independent in assisting Pt Will Transfer to Toilet: with min assist;stand pivot transfer;bedside commode  Plan         AM-PAC OT "6 Clicks" Daily Activity     Outcome Measure   Help from another person eating meals?: None Help from another person taking care of personal grooming?: A Little Help from another person toileting, which includes using toliet, bedpan, or urinal?: A Lot Help from another person bathing (including washing, rinsing, drying)?: A Lot Help from another person to put on and taking off regular upper body clothing?: A Little Help from another person to put on and taking off regular lower body clothing?: A Lot 6 Click Score: 16    End of Session    OT Visit Diagnosis: Unsteadiness on feet (R26.81)   Activity Tolerance Patient tolerated treatment well   Patient Left with family/visitor present;in chair;with call bell/phone within reach;with chair alarm set   Nurse Communication Mobility status        Time: 0454-0981 OT Time Calculation (min): 26 min  Charges: OT General Charges $OT Visit: 1 Visit OT Treatments $Self Care/Home Management : 8-22 mins  Luceal Hollibaugh L. Deshauna Cayson, OTR/L  09/06/23, 1:02 PM

## 2023-09-06 NOTE — Assessment & Plan Note (Signed)
 Patient back on metoprolol.

## 2023-09-07 DIAGNOSIS — W19XXXS Unspecified fall, sequela: Secondary | ICD-10-CM | POA: Diagnosis not present

## 2023-09-07 DIAGNOSIS — N179 Acute kidney failure, unspecified: Secondary | ICD-10-CM | POA: Diagnosis not present

## 2023-09-07 DIAGNOSIS — D62 Acute posthemorrhagic anemia: Secondary | ICD-10-CM | POA: Diagnosis not present

## 2023-09-07 DIAGNOSIS — I214 Non-ST elevation (NSTEMI) myocardial infarction: Secondary | ICD-10-CM | POA: Diagnosis not present

## 2023-09-07 LAB — CBC
HCT: 31 % — ABNORMAL LOW (ref 36.0–46.0)
Hemoglobin: 10.5 g/dL — ABNORMAL LOW (ref 12.0–15.0)
MCH: 30.3 pg (ref 26.0–34.0)
MCHC: 33.9 g/dL (ref 30.0–36.0)
MCV: 89.6 fL (ref 80.0–100.0)
Platelets: 133 10*3/uL — ABNORMAL LOW (ref 150–400)
RBC: 3.46 MIL/uL — ABNORMAL LOW (ref 3.87–5.11)
RDW: 22.3 % — ABNORMAL HIGH (ref 11.5–15.5)
WBC: 4.9 10*3/uL (ref 4.0–10.5)
nRBC: 0 % (ref 0.0–0.2)

## 2023-09-07 LAB — BASIC METABOLIC PANEL
Anion gap: 8 (ref 5–15)
BUN: 28 mg/dL — ABNORMAL HIGH (ref 8–23)
CO2: 24 mmol/L (ref 22–32)
Calcium: 8.5 mg/dL — ABNORMAL LOW (ref 8.9–10.3)
Chloride: 102 mmol/L (ref 98–111)
Creatinine, Ser: 0.8 mg/dL (ref 0.44–1.00)
GFR, Estimated: >60 mL/min (ref >60)
Glucose, Bld: 96 mg/dL (ref 70–99)
Potassium: 4.6 mmol/L (ref 3.5–5.1)
Sodium: 134 mmol/L — ABNORMAL LOW (ref 135–145)

## 2023-09-07 LAB — BPAM RBC
Blood Product Expiration Date: 202503242359
ISSUE DATE / TIME: 202502271402
Unit Type and Rh: 5100

## 2023-09-07 LAB — TYPE AND SCREEN
ABO/RH(D): B POS
Antibody Screen: NEGATIVE
Unit division: 0

## 2023-09-07 MED ORDER — POLYETHYLENE GLYCOL 3350 17 G PO PACK
17.0000 g | PACK | Freq: Every day | ORAL | Status: DC
  Administered 2023-09-08 – 2023-09-16 (×8): 17 g via ORAL
  Filled 2023-09-07 (×11): qty 1

## 2023-09-07 MED ORDER — IPRATROPIUM-ALBUTEROL 0.5-2.5 (3) MG/3ML IN SOLN
3.0000 mL | Freq: Four times a day (QID) | RESPIRATORY_TRACT | Status: DC
  Administered 2023-09-07 (×2): 3 mL via RESPIRATORY_TRACT
  Filled 2023-09-07: qty 3

## 2023-09-07 NOTE — Progress Notes (Signed)
 Progress Note   Patient: Kathleen Paul WUJ:[id number] DOB: 08/03/1935 DOA: 09/01/2023     6 DOS: the patient was seen and examined on 09/07/2023   Brief hospital course: Taken from H&P.  Kathleen Paul is a 88 y.o. Caucasian female with medical history significant for anxiety, osteoarthritis, diverticulosis, gastric ulcer, history of DVT, dyslipidemia, essential hypertension and nonobstructive coronary artery disease, who presented to the emergency room with acute onset of fall while ambulating with her walker at home after using the bathroom.  She hit her head.  She believes that her knee gave out on her.  No presyncope or syncope.   On presentation patient has borderline bradycardia at 46, blood pressure 149/82, labs with mild hyponatremia at 134 , CO2 of 20, BUN 45, anion gap of 18.  Troponin 25>410.  Coagulation profile was normal.  UA was unremarkable. EKG with NSR, multiple PVCs, right bundle branch block and inferior Q waves CT head with no acute intracranial abnormalities, CT C-spine with advanced degenerative disc and facet disease diffusely with no acute bony abnormality. Right humerus x-ray with acute mildly displaced fracture of the mid right humeral diaphysis, severe chronic arthropathy of the right shoulder.  Cardiology and orthopedic surgery was consulted.,  Started on heparin infusion.  2/23: Vital stable with borderline soft blood pressure at 95/37, labs with significant increase in APTT to above 200, hemoglobin decreased to 8.9 from 11.3 yesterday, BMP with slight worsening of hyponatremia at 131, CO2 16, BUN 40, anion gap of 16, lipid panel normal. Starting on LR, UA with 20 ketones, lactic acid of 2.4. Repeat hemoglobin came back at 8.2.  CT of right upper extremity and abdomen was obtained as she was having abdominal pain to rule out any concealed bleeding, CT abdomen was without any acute abnormality but CT of right humerus with concern of bleeding.  Discussed with  orthopedic surgery and they were just recommending monitoring and blood transfusion with the hope that bleeding will stop.  They are recommending nonsurgical conservative management.  Also discussed with cardiology and heparin infusion was stopped.  Troponin continued to get worse. Ordered 1 unit of PRBC.  2/24: Hemodynamically stable and clinically improving.  Hemoglobin 9.1.  B12 1621.  Lactic acidosis resolved, worsening renal function with creatinine at 2.15 and hyperkalemia with potassium at 5.3.  Patient has not made any urine after midnight.  Giving some IV fluid, renal ultrasound without any significant abnormality.  Troponin peaked at 1431 and now trending down, echocardiogram pending.  2/25: Hemodynamically stable but another decrease in hemoglobin to 7.1, repeating imaging of right upper extremity.  Patient does not feel any worsening pain or tightness, remained with soft cast and in sling.  Ordered 1 more unit of PRBC.  Improving urinary output and renal function. Echocardiogram without any regional wall motion abnormalities-cardiology is recommending conservative management for now.  2/26: Initially stable but did had an episode of sinus tachycardia with heart rate remained in 180s for few minutes, EKG with sinus tachycardia and PVCs.  Patient back on metoprolol.  Orthopedic reevaluated. 2/27.  Hemoglobin drifted down to 7.4 will give another unit of blood. 2/28.  Hemoglobin up to 10.5.  Patient agreeable to rehab.  Assessment and Plan: * Acute blood loss anemia (ABLA) Patient received 3 units total of packed red blood cells during the hospital course last being on 2/27.  Patient does have bruising on the right arm.  CT scan showing hematoma right arm.  Last hemoglobin 10.5.  NSTEMI (non-ST  elevated myocardial infarction) (HCC) No chest pain with troponin peaked at 1431.  Cardiology recommended conservative management.  Patient on aspirin, metoprolol, simvastatin.   AKI (acute  kidney injury) (HCC) Creatinine worsened to 2.15 on 2/24 and the last creatinine down to 0.80.  Fall Closed right spiral humeral fracture. Orthopedic surgery recommending conservative management.  Hyperkalemia Needed Lokelma during the hospital course  Essential hypertension Patient on metoprolol  Hyponatremia Sodium 134 today  Lactic acidosis Resolved now.  Likely with NSTEMI and fall resulting in right humeral fracture.   Hypothyroidism Continue Synthroid.  Dyslipidemia LDL 65 -Continuing home Zocor  PSVT (paroxysmal supraventricular tachycardia) (HCC) Patient back on metoprolol.  GERD without esophagitis Continue PPI therapy  Generalized weakness Physical therapy recommending rehab but patient determined to go home.  Today was max assist with physical therapy.        Subjective: Patient now agreeable to rehab.  Came in after a fall and diagnosed with NSTEMI and started on blood thinner but then had bleeding into her arm.  Physical Exam: Vitals:   09/07/23 0833 09/07/23 1134 09/07/23 1407 09/07/23 1641  BP: (!) 140/85 127/61  (!) 149/60  Pulse: 64 67 65 67  Resp: 16 16 18    Temp: 98.4 F (36.9 C) 97.9 F (36.6 C)  97.7 F (36.5 C)  TempSrc: Oral Oral  Oral  SpO2: 97% 94% 99% 98%  Weight:      Height:       Physical Exam HENT:     Head: Normocephalic.     Mouth/Throat:     Pharynx: No oropharyngeal exudate.  Eyes:     General: Lids are normal.     Conjunctiva/sclera: Conjunctivae normal.  Cardiovascular:     Rate and Rhythm: Normal rate and regular rhythm.     Heart sounds: Normal heart sounds, S1 normal and S2 normal.  Pulmonary:     Breath sounds: Examination of the right-lower field reveals decreased breath sounds. Examination of the left-lower field reveals decreased breath sounds. Decreased breath sounds present. No wheezing, rhonchi or rales.  Abdominal:     Palpations: Abdomen is soft.     Tenderness: There is no abdominal tenderness.   Musculoskeletal:     Right lower leg: Swelling present.     Left lower leg: Swelling present.  Skin:    General: Skin is warm.     Comments: Bruising right arm.  Neurological:     Mental Status: She is alert and oriented to person, place, and time.     Data Reviewed: Hemoglobin 10.5, platelet count 133, white blood cell count 4.9, sodium 134 Family Communication: Son at bedside  Disposition: Status is: Inpatient Remains inpatient appropriate because: Patient now agreeable to rehab.  Hemoglobin came up well with blood transfusion.  Planned Discharge Destination: Rehab    Time spent: 29 minutes  Author: Alford Highland, MD 09/07/2023 5:00 PM  For on call review [email or web address].

## 2023-09-07 NOTE — Plan of Care (Signed)
  Problem: Activity: Goal: Ability to tolerate increased activity will improve Outcome: Not Met (add Reason) Note: Patient refuses to engage in activity   Problem: Clinical Measurements: Goal: Respiratory complications will improve Outcome: Progressing Goal: Cardiovascular complication will be avoided Outcome: Progressing

## 2023-09-07 NOTE — TOC Progression Note (Signed)
 Transition of Care [facility]) - Progression Note    Patient Details  Name: Kathleen Paul MRN: [id number] Date of Birth: Jun 08, 1936  [facility] CM/SW Contact  Truddie Hidden, RN Phone Number: 09/07/2023, 4:13 PM  Clinical Narrative:    Per MD family patient and family agreeable to SNF  FL2 completed.  [employer] MUST triggered level II PASSR.         Expected Discharge Plan and Services                                               Social Determinants of Health (SDOH) Interventions SDOH Screenings   Food Insecurity: No Food Insecurity (09/02/2023)  Housing: Low Risk  (09/02/2023)  Transportation Needs: No Transportation Needs (09/02/2023)  Utilities: Not At Risk (09/02/2023)  Alcohol Screen: Low Risk  (12/18/2022)  Depression (PHQ2-9): Low Risk  (12/18/2022)  Financial Resource Strain: Low Risk  (12/18/2022)  Physical Activity: Inactive (12/18/2022)  Social Connections: Socially Isolated (09/02/2023)  Stress: No Stress Concern Present (12/18/2022)  Tobacco Use: Medium Risk (09/01/2023)    Readmission Risk Interventions     No data to display

## 2023-09-07 NOTE — Progress Notes (Signed)
 Physical Therapy Treatment Patient Details Name: Kathleen Paul MRN: [id number] DOB: [birth year] Today's Date: 09/07/2023   History of Present Illness Kathleen Paul is an 87yoF who comes [facility] 2/22 after fall at home. Noted elevation of troponin as well. Foundtohave Acute mildly displaced fracture of the mid right humeral diaphysis; managed non surgically. 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    PT Comments  Pt was sitting in recliner upon arrival. She slept in recliner previous night and does at baseline. Pt's son arrived and was very supportive. Pt is eager to participate but does continue to require extensive assistance to stand and perform almost all ADLs. Chartered loss adjuster used stedy to stand 2nd/3rd attempt. Highly recommend +2 assist or use of mechanical lift for safe transfers at this time. Discussed case with MD, son, and pt. DC recs remain appropriate to maximize independence and safety with all ADLs.    If plan is discharge home, recommend the following: A lot of help with walking and/or transfers;A lot of help with bathing/dressing/bathroom;Assist for transportation;Help with stairs or ramp for entrance     Equipment Recommendations   (Hemi-walker)       Precautions / Restrictions Precautions Precautions: None Recall of Precautions/Restrictions: Intact Required Braces or Orthoses: Splint/Cast;Sling;Other Brace (shoulder immobilizer and sling) Splint/Cast: RUE splint in 90 degrees. Noteable R hand swelling Restrictions Weight Bearing Restrictions Per Provider Order: Yes RUE Weight Bearing Per Provider Order: Non weight bearing     Mobility  Bed Mobility  General bed mobility comments: pt in recliner and sleeps in recliner at baseline. Did sleep in recliner last night.    Transfers Overall transfer level: Needs assistance Equipment used: Hemi-walker (stedy) Transfers: Sit to/from  Stand Sit to Stand: Max assist  General transfer comment: Max assist to stand 1 x to hemiwalker then max assist to stand  2 x from recliner while using stedy. Pt is severely weak and mostly limited by NWB restrictions    Ambulation/Gait  General Gait Details: currently unable    Balance Overall balance assessment: Needs assistance Sitting-balance support: No upper extremity supported, Feet supported Sitting balance-Leahy Scale: Good     Standing balance support: Single extremity supported, During functional activity, Reliant on assistive device for balance Standing balance-Leahy Scale: Poor Standing balance comment: pt remains high fall risk       Communication Communication Communication: No apparent difficulties  Cognition Arousal: Alert Behavior During Therapy: WFL for tasks assessed/performed   PT - Cognitive impairments: No apparent impairments    PT - Cognition Comments: Pt is A and O and cooperative throughout Following commands: Intact      Cueing Cueing Techniques: Verbal cues, Tactile cues         Pertinent Vitals/Pain Pain Assessment Pain Assessment: 0-10 Pain Score: 4  Pain Descriptors / Indicators: Headache, Discomfort, Dull Pain Intervention(s): Limited activity within patient's tolerance, Monitored during session, Premedicated before session, Repositioned     PT Goals (current goals can now be found in the care plan section) Acute Rehab PT Goals Patient Stated Goal: rehab then home Progress towards PT goals: Progressing toward goals    Frequency    7X/week       AM-PAC PT "6 Clicks" Mobility   Outcome Measure  Help needed turning from your back to your side while in a flat bed without using bedrails?: A Lot Help needed moving from lying on your back to  sitting on the side of a flat bed without using bedrails?: A Lot Help needed moving to and from a bed to a chair (including a wheelchair)?: A Lot Help needed standing up from a chair using  your arms (e.g., wheelchair or bedside chair)?: A Lot Help needed to walk in hospital room?: Total Help needed climbing 3-5 steps with a railing? : Total 6 Click Score: 10    End of Session   Activity Tolerance: Patient tolerated treatment well Patient left: in chair;with call bell/phone within reach;with chair alarm set Nurse Communication: Mobility status PT Visit Diagnosis: Difficulty in walking, not elsewhere classified (R26.2);Other abnormalities of gait and mobility (R26.89);Repeated falls (R29.6);Muscle weakness (generalized) (M62.81)     Time: 1610-9604 PT Time Calculation (min) (ACUTE ONLY): 33 min  Charges:    $Therapeutic Activity: 23-37 mins PT General Charges $$ ACUTE PT VISIT: 1 Visit                     Jetta Lout PTA 09/07/23, 12:23 PM

## 2023-09-07 NOTE — Plan of Care (Signed)
  Problem: Education: Goal: Understanding of cardiac disease, CV risk reduction, and recovery process will improve Outcome: Progressing Goal: Individualized Educational Video(s) Outcome: Progressing   Problem: Activity: Goal: Ability to tolerate increased activity will improve Outcome: Not Met (add Reason)   Problem: Cardiac: Goal: Ability to achieve and maintain adequate cardiovascular perfusion will improve Outcome: Progressing   Problem: Health Behavior/Discharge Planning: Goal: Ability to safely manage health-related needs after discharge will improve Outcome: Progressing   Problem: Education: Goal: Knowledge of General Education information will improve Description: Including pain rating scale, medication(s)/side effects and non-pharmacologic comfort measures Outcome: Progressing   Problem: Clinical Measurements: Goal: Ability to maintain clinical measurements within normal limits will improve Outcome: Progressing Goal: Will remain free from infection Outcome: Progressing Goal: Diagnostic test results will improve Outcome: Progressing Goal: Respiratory complications will improve Outcome: Progressing Goal: Cardiovascular complication will be avoided Outcome: Progressing   Problem: Nutrition: Goal: Adequate nutrition will be maintained Outcome: Progressing

## 2023-09-07 NOTE — [place] FL2 (Signed)
 [place] MEDICAID FL2 LEVEL OF CARE FORM     IDENTIFICATION  Patient Name: Kathleen Paul Birthdate: Sep 23, 1935 Sex: female Admission Date (Current Location): 09/01/2023  [facility] and [place] Number:  Chiropodist and Address:  [facility], [address], [place], [place] [address]      Provider Number: [id number]  Attending Physician Name and Address:  Alford Highland, MD  Relative Name and Phone Number:  Juliana, [place])  [phone] [facility])    Current Level of Care: Hospital Recommended Level of Care: Skilled Nursing Facility Prior Approval Number:    Date Approved/Denied:   PASRR Number: [id number] E Expired 11/22/2022  Discharge Plan: SNF    Current Diagnoses: Patient Active Problem List   Diagnosis Date Noted   PSVT (paroxysmal supraventricular tachycardia) (HCC) 09/05/2023   AKI (acute kidney injury) (HCC) 09/03/2023   Hyperkalemia 09/03/2023   Fall 09/02/2023   Closed right humeral fracture 09/02/2023   Acute blood loss anemia (ABLA) 09/02/2023   Lactic acidosis 09/02/2023   NSTEMI (non-ST elevated myocardial infarction) (HCC) 09/01/2023   Essential hypertension 09/01/2023   GERD without esophagitis 09/01/2023   Hyponatremia 10/20/2022   CAP (community acquired pneumonia) 10/20/2022   TIA (transient ischemic attack) 06/11/2022   Chronic kidney disease, stage 3a (HCC) 06/11/2022   Mechanical complication of knee prosthesis, sequela 03/03/2020   Preventative health care 11/19/2019   Patellar tendon rupture, left, subsequent encounter 07/01/2019   Rupture patellar tendon, left, subsequent encounter 06/03/2019   Osteoarthritis of right knee 05/18/2019   Rotator cuff arthropathy of left shoulder 04/18/2019   Rotator cuff arthropathy of right shoulder 04/18/2019   Osteoarthritis of left knee 01/16/2019   Food impaction of esophagus 04/02/2018   Primary osteoarthritis of both hands 12/24/2017   Primary osteoarthritis of  both knees 12/24/2017   Primary osteoarthritis of both feet 12/24/2017   Osteoarthritis of both shoulders 12/24/2017   Gastroesophageal reflux disease 08/30/2017   GAD (generalized anxiety disorder) 09/05/2016   Generalized weakness 01/29/2014   Hypothyroidism    UI (urinary incontinence)    Stricture and stenosis of esophagus 04/11/2011   Dyslipidemia 03/29/2011   Hypertension 03/29/2011   CAD (coronary artery disease) 03/29/2011   Palpitations 03/29/2011    Orientation RESPIRATION BLADDER Height & Weight     Self, Time, Situation, Place  Normal External catheter, Incontinent Weight: 62.6 kg Height:  5' (152.4 cm)  BEHAVIORAL SYMPTOMS/MOOD NEUROLOGICAL BOWEL NUTRITION STATUS  Other (Comment) (n/a)  (n/a) Continent Diet (Regular: Thin)  AMBULATORY STATUS COMMUNICATION OF NEEDS Skin   Extensive Assist Verbally Bruising (Arm and leg)                       Personal Care Assistance Level of Assistance  Bathing, Feeding, Dressing Bathing Assistance: Limited assistance Feeding assistance: Limited assistance Dressing Assistance: Limited assistance     Functional Limitations Info  Sight, Hearing Sight Info: Impaired Hearing Info: Adequate      SPECIAL CARE FACTORS FREQUENCY  PT (By licensed PT), OT (By licensed OT)     PT Frequency: Min 2x weekly OT Frequency: Min 2x weekly            Contractures Contractures Info: Not present    Additional Factors Info  Code Status, Allergies Code Status Info: DNR Allergies Info: Codeine           Current Medications (09/07/2023):  This is the current hospital active medication list Current Facility-Administered Medications  Medication Dose Route Frequency Provider Last  Rate Last Admin   acetaminophen (TYLENOL) tablet 650 mg  650 mg Oral Q4H PRN Mansy, Jan A, MD   650 mg at 09/06/23 1636   ALPRAZolam Prudy Feeler) tablet 0.25 mg  0.25 mg Oral BID PRN Mansy, Jan A, MD   0.25 mg at 09/07/23 1113   alum & mag hydroxide-simeth  (MAALOX/MYLANTA) 200-200-20 MG/5ML suspension 10 mL  10 mL Oral Q4H PRN Mansy, Jan A, MD       aspirin EC tablet 81 mg  81 mg Oral Daily Mansy, Jan A, MD   81 mg at 09/07/23 1011   bisacodyl (DULCOLAX) suppository 10 mg  10 mg Rectal PRN Mansy, Jan A, MD   10 mg at 09/04/23 2302   calcium carbonate (OS-CAL - dosed in mg of elemental calcium) tablet 1,250 mg  1 tablet Oral BID WC Arnetha Courser, MD   1,250 mg at 09/07/23 1012   gabapentin (NEURONTIN) capsule 100 mg  100 mg Oral TID Tressie Ellis, RPH   100 mg at 09/07/23 1012   HYDROcodone-acetaminophen (NORCO/VICODIN) 5-325 MG per tablet 1 tablet  1 tablet Oral Q6H PRN Arnetha Courser, MD   1 tablet at 09/06/23 2103   ipratropium-albuterol (DUONEB) 0.5-2.5 (3) MG/3ML nebulizer solution 3 mL  3 mL Nebulization Q6H Wieting, Richard, MD   3 mL at 09/07/23 1404   lactated ringers bolus 1,000 mL  1,000 mL Intravenous Once Arnetha Courser, MD   Held at 09/02/23 1817   lactulose (CHRONULAC) 10 GM/15ML solution 30 g  30 g Oral BID PRN Arnetha Courser, MD   30 g at 09/05/23 0143   levothyroxine (SYNTHROID) tablet 88 mcg  88 mcg Oral Q0600 Mansy, Jan A, MD   88 mcg at 09/07/23 1610   metoprolol tartrate (LOPRESSOR) tablet 25 mg  25 mg Oral BID Charlsie Quest, NP   25 mg at 09/07/23 1011   morphine (PF) 2 MG/ML injection 2 mg  2 mg Intravenous Q2H PRN Mansy, Jan A, MD   2 mg at 09/02/23 0808   multivitamin with minerals tablet 1 tablet  1 tablet Oral Daily Mansy, Jan A, MD   1 tablet at 09/07/23 1011   nitroGLYCERIN (NITROSTAT) SL tablet 0.4 mg  0.4 mg Sublingual Q5 Min x 3 PRN Mansy, Jan A, MD       ondansetron [facility]) injection 4 mg  4 mg Intravenous Q6H PRN Mansy, Jan A, MD   4 mg at 09/02/23 9604   Oral care mouth rinse  15 mL Mouth Rinse PRN Manuela Schwartz, NP       pantoprazole (PROTONIX) EC tablet 40 mg  40 mg Oral Daily Mansy, Jan A, MD   40 mg at 09/07/23 1011   polyethylene glycol (MIRALAX / GLYCOLAX) packet 17 g  17 g Oral Daily Wieting, Richard, MD        promethazine (PHENERGAN) 12.5 mg in sodium chloride 0.9 % 50 mL IVPB  12.5 mg Intravenous Q6H PRN Arnetha Courser, MD   Stopped at 09/02/23 1107   simvastatin (ZOCOR) tablet 40 mg  40 mg Oral q1800 Mansy, Jan A, MD   40 mg at 09/06/23 1632     Discharge Medications: Please see discharge summary for a list of discharge medications.  Relevant Imaging Results:  Relevant Lab Results:   Additional Information [id number]  Truddie Hidden, RN

## 2023-09-08 DIAGNOSIS — D62 Acute posthemorrhagic anemia: Secondary | ICD-10-CM | POA: Diagnosis not present

## 2023-09-08 LAB — HEMOGLOBIN: Hemoglobin: 9.4 g/dL — ABNORMAL LOW (ref 12.0–15.0)

## 2023-09-08 MED ORDER — IPRATROPIUM-ALBUTEROL 0.5-2.5 (3) MG/3ML IN SOLN: 3.0000 mL | Freq: Four times a day (QID) | RESPIRATORY_TRACT | Status: DC | PRN

## 2023-09-08 MED ORDER — IPRATROPIUM-ALBUTEROL 0.5-2.5 (3) MG/3ML IN SOLN
3.0000 mL | Freq: Three times a day (TID) | RESPIRATORY_TRACT | Status: DC
  Administered 2023-09-08 – 2023-09-09 (×5): 3 mL via RESPIRATORY_TRACT
  Filled 2023-09-08 (×7): qty 3

## 2023-09-08 MED ORDER — ENALAPRIL MALEATE 10 MG PO TABS
20.0000 mg | ORAL_TABLET | Freq: Every day | ORAL | Status: DC
  Administered 2023-09-09: 20 mg via ORAL
  Filled 2023-09-08 (×2): qty 1
  Filled 2023-09-08: qty 2
  Filled 2023-09-08: qty 1
  Filled 2023-09-08: qty 2
  Filled 2023-09-08 (×2): qty 1

## 2023-09-08 MED ORDER — DOCUSATE SODIUM 100 MG PO CAPS
100.0000 mg | ORAL_CAPSULE | Freq: Two times a day (BID) | ORAL | Status: DC
  Administered 2023-09-08 – 2023-09-18 (×19): 100 mg via ORAL
  Filled 2023-09-08 (×20): qty 1

## 2023-09-08 NOTE — Progress Notes (Signed)
 Physical Therapy Treatment Patient Details Name: REGGIE WELGE MRN: [id number] DOB: [birth year] Today's Date: 09/08/2023   History of Present Illness Ariaunna Longsworth is an 87yoF who comes to [facility] on 2/22 after fall at home. Noted elevation of troponin as well. Foundtohave Acute mildly displaced fracture of the mid right humeral diaphysis; managed non surgically. 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    PT Comments  Pt was long sitting in bed upon arrival. Continues to endorse feeling better by the day. She is eager to get OOB to recliner for breakfast. Pt is extremely motivated. She does continue to require extensive assistance due to her inability to use RUE. Overall R hand less swollen and red versus previous date. Pt remains unable to wear shoulder immobilizer on RUE due to splint size. Shoulder immobilizer straps unable to fit around splint. Pt remains in standard sling but requesting a more comfortable sling overall. Pt may benefit form BREG sling that has additional cushion/comfort than current standard sling. Pt was able to exit bed with less overall assistance. She stood more erect and was actually able to slowly, antalgic ly, and shuffling steps from EOB to recliner with LUE support on hemiwalker. Author high recommends +2 assistance for future ambulation attempts.  Author had planned to transfer pt via slide board but pt wanted to try standing/ step transfer. HR in 80s throughout session. Pt was repositioned in recliner. Will return to assist pt back to bed later in the day as requested. Dc recs remain appropriate.    If plan is discharge home, recommend the following: A lot of help with walking and/or transfers;A lot of help with bathing/dressing/bathroom;Assist for transportation;Help with stairs or ramp for entrance     Equipment Recommendations  Other (comment) (hemiwalker + breg sling (current  sling straps uncomfortably to pt) Unable to place shoulder immobilizer over current splint  as ordered)       Precautions / Restrictions Precautions Precautions: Other (comment) (severe OA) Recall of Precautions/Restrictions: Intact Required Braces or Orthoses: Splint/Cast;Sling;Other Brace Splint/Cast: RUE splint in 90 degrees. Noteable R hand swelling <less today versus previous date Other Brace:  (pt in standard sling. unable to wear shoulder immobilizer due to spint size. pt requesting breg style sling) Restrictions Weight Bearing Restrictions Per Provider Order: Yes RUE Weight Bearing Per Provider Order: Non weight bearing     Mobility  Bed Mobility Overal bed mobility: Needs Assistance Bed Mobility: Supine to Sit  Supine to sit: HOB elevated, Mod assist  General bed mobility comments: increased time. mod assist for achieve hps to EOB. reliant on LUE    Transfers Overall transfer level: Needs assistance Equipment used: Hemi-walker Transfers: Sit to/from Stand Sit to Stand: Max assist, From elevated surface Step pivot transfers: Mod assist, From elevated surface  General transfer comment: pt was able to stand form elevated bed height and progress to taking ~ 3-5 steps to pivot to recliner. much improved standing posture today however still requires extensive assistance + vcs + use of gait belt for additional safety. much improved strength/ abilities from previous date    Ambulation/Gait  General Gait Details: Took a few, slow, shuffling steps to recliner from EOB    Balance Overall balance assessment: Needs assistance Sitting-balance support: No upper extremity supported, Feet supported Sitting balance-Leahy Scale: Good     Standing balance support: Single extremity supported, During functional activity, Reliant on assistive device for  balance Standing balance-Leahy Scale: Poor Standing balance comment: pt remains high fall risk       Communication  Communication Communication: No apparent difficulties  Cognition Arousal: Alert Behavior During Therapy: WFL for tasks assessed/performed   PT - Cognitive impairments: No apparent impairments      PT - Cognition Comments: Pt is A and O and cooperative throughout Following commands: Intact      Cueing Cueing Techniques: Verbal cues, Tactile cues     General Comments General comments (skin integrity, edema, etc.): pt performed severeal exercises in recliner with BLEs prior to being repositioned with pillow support under RUE and uice pack applied. Pt tolerated session well.      Pertinent Vitals/Pain Pain Assessment Pain Assessment: 0-10 Pain Score: 3  Pain Location: Arm, HA Pain Descriptors / Indicators: Headache, Discomfort, Dull Pain Intervention(s): Limited activity within patient's tolerance, Monitored during session, Premedicated before session, Repositioned, Ice applied     PT Goals (current goals can now be found in the care plan section) Acute Rehab PT Goals Patient Stated Goal: rehab then home Progress towards PT goals: Progressing toward goals    Frequency    7X/week       AM-PAC PT "6 Clicks" Mobility   Outcome Measure  Help needed turning from your back to your side while in a flat bed without using bedrails?: A Lot Help needed moving from lying on your back to sitting on the side of a flat bed without using bedrails?: A Lot Help needed moving to and from a bed to a chair (including a wheelchair)?: A Lot Help needed standing up from a chair using your arms (e.g., wheelchair or bedside chair)?: A Lot Help needed to walk in hospital room?: A Lot Help needed climbing 3-5 steps with a railing? : Total 6 Click Score: 11    End of Session Equipment Utilized During Treatment: Gait belt (heavy use of gait belt durig transfer) Activity Tolerance: Patient tolerated treatment well Patient left: in chair;with call bell/phone within reach;with chair alarm  set Nurse Communication: Mobility status PT Visit Diagnosis: Difficulty in walking, not elsewhere classified (R26.2);Other abnormalities of gait and mobility (R26.89);Repeated falls (R29.6);Muscle weakness (generalized) (M62.81)     Time: 4098-1191 PT Time Calculation (min) (ACUTE ONLY): 20 min  Charges:    $Therapeutic Activity: 8-22 mins PT General Charges $$ ACUTE PT VISIT: 1 Visit                     Jetta Lout PTA 09/08/23, 9:06 AM

## 2023-09-08 NOTE — Plan of Care (Signed)
  Problem: Activity: Goal: Ability to tolerate increased activity will improve Outcome: Progressing   Problem: Cardiac: Goal: Ability to achieve and maintain adequate cardiovascular perfusion will improve Outcome: Progressing   Problem: Education: Goal: Knowledge of General Education information will improve Description: Including pain rating scale, medication(s)/side effects and non-pharmacologic comfort measures Outcome: Progressing   Problem: Health Behavior/Discharge Planning: Goal: Ability to manage health-related needs will improve Outcome: Progressing   Problem: Clinical Measurements: Goal: Ability to maintain clinical measurements within normal limits will improve Outcome: Progressing Goal: Will remain free from infection Outcome: Progressing Goal: Diagnostic test results will improve Outcome: Progressing Goal: Respiratory complications will improve Outcome: Progressing Goal: Cardiovascular complication will be avoided Outcome: Progressing   Problem: Activity: Goal: Risk for activity intolerance will decrease Outcome: Progressing   Problem: Nutrition: Goal: Adequate nutrition will be maintained Outcome: Progressing   Problem: Coping: Goal: Level of anxiety will decrease Outcome: Progressing   Problem: Elimination: Goal: Will not experience complications related to bowel motility Outcome: Progressing Goal: Will not experience complications related to urinary retention Outcome: Progressing   Problem: Pain Managment: Goal: General experience of comfort will improve and/or be controlled Outcome: Progressing

## 2023-09-08 NOTE — Progress Notes (Addendum)
 Author returned as requested to assist pt back to bed. She continues to endorse severe headache that ice pack on back of her neck seemed to help. Pt also endorsing pain in RUE that she was not c/o prior to today. She required max assist of one to stand pivot from recliner to EOB using hemiwalker. Pt is slow moving however demonstrating improved abilities an overall tolerance. Author encourages +2 assistance for transfers at this time.

## 2023-09-08 NOTE — Plan of Care (Signed)

## 2023-09-08 NOTE — Progress Notes (Signed)
 Progress Note   Patient: Kathleen Paul ZOX:[id number] DOB: 11-Mar-1936 DOA: 09/01/2023     7 DOS: the patient was seen and examined on 09/08/2023   Brief hospital course: 87yo with h/o anxiety, gastric ulcer, HLD, DVT, HTN, and CAD who presented on 2/22 with a mechanical fall.  She was found to have a R humerus fracture and orthopedics was consulted.  She was also noted to have elevated troponin (25 -> 410 -> 1431, now downtrending) and cardiology was consulted, started heparin.  Developed ABLA with CT showing concern for humerus bleeding and heparin was stopped.  Transfused 3 units PRBC.  Echo without WMA, cardiology recommended conservative management.    Assessment and Plan:  Mechanical fall Patient presented with mechanical fall that resulted in humerus fracture (nonoperative) She has had generalized weakness and will require SNF rehab  Acute blood loss anemia  Patient received 3 units total of packed red blood cells during the hospital course last being on 2/27 Patient does have bruising on the right arm CT scan showing hematoma right arm Hgb 10.5 on 2/28 but only a POCT today so hard to compare Repeat CBC in AM   Elevated troponin No chest pain  Troponin peaked at 1431 Likely related to demand ischemia Cardiology recommended conservative management Heparin was stopped with ABLA Patient on aspirin, metoprolol, simvastatin   AKI (acute kidney injury)  Resolved   Closed right spiral humeral fracture Orthopedic surgery recommending conservative management PT recommending hemiwalker and breg sling   Hyperkalemia Needed Lokelma during the hospital course Resolved   Essential hypertension Continue metoprolol BP range 123/62-171/81 Restart enalapril   Hyponatremia Sodium 134 today Not clinically relevant   Hypothyroidism Continue Synthroid.   Dyslipidemia LDL 65 Continue simvastatin   PSVT (paroxysmal supraventricular tachycardia) Patient back on metoprolol    GERD without esophagitis Continue PPI therapy  DNR Confirmed on admission [facility] documents reviewed      Consultants: Orthopedics Cardiology PT OT [facility] team  Procedures: Echocardiogram 2/24  Antibiotics: None    30 Day Unplanned Readmission Risk Score    Flowsheet Row ED to Hosp-Admission (Current) from 09/01/2023 in [facility] PCU  30 Day Unplanned Readmission Risk Score (%) 17.8 Filed at 09/08/2023 0800       This score is the patient's risk of an unplanned readmission within 30 days of being discharged (0 -100%). The score is based on dignosis, age, lab data, medications, orders, and past utilization.   Low:  0-14.9   Medium: 15-21.9   High: 22-29.9   Extreme: 30 and above           Subjective: Did well with therapy this AM.  Frustrated that she is not significantly better already.   Objective: Vitals:   09/08/23 1306 09/08/23 1400  BP: 137/76   Pulse: 83 87  Resp:  16  Temp: 97.8 F (36.6 C)   SpO2: 100% 99%    Intake/Output Summary (Last 24 hours) at 09/08/2023 1413 Last data filed at 09/08/2023 0700 Gross per 24 hour  Intake 120 ml  Output 900 ml  Net -780 ml   Filed Weights   09/01/23 1751  Weight: 62.6 kg    Exam:  General:  Appears calm and comfortable and is in NAD, un in bedside chair Eyes:  EOMI, normal lids, iris ENT:  grossly normal hearing, lips & tongue, mmm Cardiovascular:  RRR, no m/r/g. No LE edema.  Respiratory:   CTA bilaterally with no wheezes/rales/rhonchi.  Normal respiratory effort. Abdomen:  soft, NT, ND Skin:  no rash or induration seen on limited exam Musculoskeletal:  RUE sling in place with splinting Psychiatric:  grossly normal mood and affect, speech fluent and appropriate, AOx3 Neurologic:  CN 2-12 grossly intact, moves all extremities in coordinated fashion  Data Reviewed: I have reviewed the patient's lab results since admission.  Pertinent labs for today include:   Hgb 10.5 -> 9.4  (POCT test)     Family Communication: Daughter was present throughout evaluation  Disposition: Status is: Inpatient Remains inpatient appropriate because: ongoing monitoring, awaiting placement     Time spent: 50 minutes  Unresulted Labs (From admission, onward)     Start     Ordered   09/09/23 0500  CBC with Differential/Platelet  Tomorrow morning,   R       Question:  Specimen collection method  Answer:  Lab=Lab collect   09/08/23 0833   09/09/23 0500  Basic metabolic panel  Tomorrow morning,   R       Question:  Specimen collection method  Answer:  Lab=Lab collect   09/08/23 1610             Author: Jonah Blue, MD 09/08/2023 2:13 PM  For on call review [email or web address].

## 2023-09-08 NOTE — Progress Notes (Signed)
 Pt c/o severe headache "at the top" of her head. Pt had same complaint yesterday afternoon - pain was resolved with norco. Today she is requesting tylenol for the headache. Pt and daughter are both very concerned about the cause of the headache. Vitals are stable. Dr. Ophelia Charter notified.

## 2023-09-09 DIAGNOSIS — D62 Acute posthemorrhagic anemia: Secondary | ICD-10-CM | POA: Diagnosis not present

## 2023-09-09 LAB — BASIC METABOLIC PANEL
Anion gap: 6 (ref 5–15)
BUN: 38 mg/dL — ABNORMAL HIGH (ref 8–23)
CO2: 28 mmol/L (ref 22–32)
Calcium: 8.9 mg/dL (ref 8.9–10.3)
Chloride: 101 mmol/L (ref 98–111)
Creatinine, Ser: 0.98 mg/dL (ref 0.44–1.00)
GFR, Estimated: 57 mL/min — ABNORMAL LOW (ref >60)
Glucose, Bld: 95 mg/dL (ref 70–99)
Potassium: 4.7 mmol/L (ref 3.5–5.1)
Sodium: 135 mmol/L (ref 135–145)

## 2023-09-09 LAB — CBC WITH DIFFERENTIAL/PLATELET
Abs Immature Granulocytes: 0.45 10*3/uL — ABNORMAL HIGH (ref 0.00–0.07)
Basophils Absolute: 0 10*3/uL (ref 0.0–0.1)
Basophils Relative: 0 %
Eosinophils Absolute: 0.1 10*3/uL (ref 0.0–0.5)
Eosinophils Relative: 1 %
HCT: 29.5 % — ABNORMAL LOW (ref 36.0–46.0)
Hemoglobin: 9.6 g/dL — ABNORMAL LOW (ref 12.0–15.0)
Immature Granulocytes: 8 %
Lymphocytes Relative: 12 %
Lymphs Abs: 0.7 10*3/uL (ref 0.7–4.0)
MCH: 30 pg (ref 26.0–34.0)
MCHC: 32.5 g/dL (ref 30.0–36.0)
MCV: 92.2 fL (ref 80.0–100.0)
Monocytes Absolute: 1.1 10*3/uL — ABNORMAL HIGH (ref 0.1–1.0)
Monocytes Relative: 18 %
Neutro Abs: 3.7 10*3/uL (ref 1.7–7.7)
Neutrophils Relative %: 61 %
Platelets: 142 10*3/uL — ABNORMAL LOW (ref 150–400)
RBC: 3.2 MIL/uL — ABNORMAL LOW (ref 3.87–5.11)
RDW: 22.6 % — ABNORMAL HIGH (ref 11.5–15.5)
Smear Review: NORMAL
WBC: 5.9 10*3/uL (ref 4.0–10.5)
nRBC: 0 % (ref 0.0–0.2)

## 2023-09-09 MED ORDER — METOPROLOL TARTRATE 5 MG/5ML IV SOLN
5.0000 mg | Freq: Once | INTRAVENOUS | Status: AC
  Administered 2023-09-09: 5 mg via INTRAVENOUS
  Filled 2023-09-09: qty 5

## 2023-09-09 MED ORDER — METOPROLOL TARTRATE 5 MG/5ML IV SOLN
5.0000 mg | Freq: Once | INTRAVENOUS | Status: AC
Start: 2023-09-09 — End: 2023-09-09
  Administered 2023-09-09: 5 mg via INTRAVENOUS
  Filled 2023-09-09: qty 5

## 2023-09-09 NOTE — Progress Notes (Signed)
       CROSS COVER NOTE  NAME: Kathleen Paul MRN: [id number] DOB : Jan 11, 1936    Date of Service   09/09/2023   HPI/Events of Note   Nurse paged for tachycardia.  Chart review shows that patient has history of paroxysmal supraventricular tachycardia and takes metoprolol.  Patient was yet to receive evening dose.  BP stable patient asymptomatic at this time.  Interventions   -Patient given 5 mg of metoprolol IV.  Will reassess and redose as necessary.  Please follow-up in the a.m.       Hollie Bartus Lamin Geradine Girt, MSN, APRN, AGACNP-BC [facility] [place] Pager: [phone]. Check Amion for Availability

## 2023-09-09 NOTE — Progress Notes (Signed)
 Physical Therapy Treatment Patient Details Name: Kathleen Paul MRN: [id number] DOB: Nov 08, 1935 Today's Date: 09/09/2023   History of Present Illness Kathleen Paul is an 87yoF who comes to [facility] on 2/22 after fall at home. Noted elevation of troponin as well. Foundtohave Acute mildly displaced fracture of the mid right humeral diaphysis; managed non surgically. 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    PT Comments  Pt ready for session.  Does state she prefers to get up earlier in AM rather than later.  She is assisted to EOB with bed features and mod a x 1.  She is generally steady in sitting while sling is applied.  She stands x 3 at EOB with max a x 1 and HW for support.  She does have trouble standing fully bu ton 3rd trial she stands well and is safe to stand pivot to drop arm recliner at bedside with +1 assist.  She remained in chair with needs met and daughter in room.  Discussed transfer with nursing staff and encourage +2 return to bed stand pivot and use Stedy lift if needed.   If plan is discharge home, recommend the following: Assist for transportation;Help with stairs or ramp for entrance;Two people to help with walking and/or transfers;Two people to help with bathing/dressing/bathroom;Assistance with cooking/housework   Can travel by private vehicle        Equipment Recommendations  Other (comment) (defer to next level of care)    Recommendations for Other Services       Precautions / Restrictions Precautions Precautions: Other (comment) (severe OA) Recall of Precautions/Restrictions: Intact Required Braces or Orthoses: Splint/Cast;Sling;Other Brace Splint/Cast: RUE splint in 90 degrees. Noteable R hand swelling Other Brace:  (pt in standard sling. unable to wear shoulder immobilizer due to spint size. pt requesting breg style sling) Restrictions Weight Bearing Restrictions Per  Provider Order: Yes RUE Weight Bearing Per Provider Order: Non weight bearing     Mobility  Bed Mobility Overal bed mobility: Needs Assistance Bed Mobility: Supine to Sit     Supine to sit: HOB elevated, Mod assist       Patient Response: Cooperative  Transfers Overall transfer level: Needs assistance Equipment used: Hemi-walker Transfers: Sit to/from Stand Sit to Stand: Max assist     Squat pivot transfers: Max assist     General transfer comment: unable to take true steps, pivoted by Clinical research associate    Ambulation/Gait                   Stairs             Wheelchair Mobility     Tilt Bed Tilt Bed Patient Response: Cooperative  Modified Rankin (Stroke Patients Only)       Balance Overall balance assessment: Needs assistance Sitting-balance support: No upper extremity supported, Feet supported Sitting balance-Leahy Scale: Good     Standing balance support: Single extremity supported, During functional activity, Reliant on assistive device for balance Standing balance-Leahy Scale: Poor Standing balance comment: pt remains high fall risk                            Communication Communication Communication: No apparent difficulties  Cognition Arousal: Alert Behavior During Therapy: WFL for tasks assessed/performed   PT - Cognitive impairments: No apparent impairments  Following commands: Intact      Cueing Cueing Techniques: Verbal cues, Tactile cues  Exercises      General Comments        Pertinent Vitals/Pain Pain Assessment Pain Assessment: Faces Faces Pain Scale: Hurts even more Pain Location: arm.  no c/o HA today Pain Descriptors / Indicators: Sore, Guarding Pain Intervention(s): Limited activity within patient's tolerance, Monitored during session, Repositioned    Home Living                          Prior Function            PT Goals (current goals can now be  found in the care plan section) Progress towards PT goals: Progressing toward goals    Frequency    7X/week      PT Plan      Co-evaluation              AM-PAC PT "6 Clicks" Mobility   Outcome Measure  Help needed turning from your back to your side while in a flat bed without using bedrails?: A Lot Help needed moving from lying on your back to sitting on the side of a flat bed without using bedrails?: A Lot Help needed moving to and from a bed to a chair (including a wheelchair)?: A Lot Help needed standing up from a chair using your arms (e.g., wheelchair or bedside chair)?: A Lot Help needed to walk in hospital room?: Total Help needed climbing 3-5 steps with a railing? : Total 6 Click Score: 10    End of Session Equipment Utilized During Treatment: Gait belt Activity Tolerance: Patient tolerated treatment well;Patient limited by pain Patient left: in bed;with call bell/phone within reach;with bed alarm set;with family/visitor present Nurse Communication: Mobility status PT Visit Diagnosis: Difficulty in walking, not elsewhere classified (R26.2);Other abnormalities of gait and mobility (R26.89);Repeated falls (R29.6);Muscle weakness (generalized) (M62.81)     Time: 1610-9604 PT Time Calculation (min) (ACUTE ONLY): 26 min  Charges:    $Therapeutic Activity: 23-37 mins PT General Charges $$ ACUTE PT VISIT: 1 Visit                   Danielle Dess, PTA 09/09/23, 12:51 PM

## 2023-09-09 NOTE — Plan of Care (Signed)

## 2023-09-09 NOTE — Progress Notes (Signed)
 Progress Note   Patient: Kathleen Paul ZOX:[id number] DOB: March 12, 1936 DOA: 09/01/2023     8 DOS: the patient was seen and examined on 09/09/2023   Brief hospital course: 87yo with h/o anxiety, gastric ulcer, HLD, DVT, HTN, and CAD who presented on 2/22 with a mechanical fall.  She was found to have a R humerus fracture and orthopedics was consulted.  She was also noted to have elevated troponin (25 -> 410 -> 1431, now downtrending) and cardiology was consulted, started heparin.  Developed ABLA with CT showing concern for humerus bleeding and heparin was stopped.  Transfused 3 units PRBC.  Echo without WMA, cardiology recommended conservative management.    Assessment and Plan:  Mechanical fall Patient presented with mechanical fall that resulted in humerus fracture (nonoperative) She has had generalized weakness and will require SNF rehab   Acute blood loss anemia  Patient received 3 units total of packed red blood cells during the hospital course last being on 2/27 Patient does have bruising on the right arm CT scan showing hematoma right arm Hgb 10.5 on 2/28 , 9.6 today Recheck CBC in AM   Elevated troponin No chest pain  Troponin peaked at 1431 Likely related to demand ischemia Cardiology recommended conservative management Heparin was stopped with ABLA Patient on aspirin, metoprolol, simvastatin   Stage 3a CKD Appears stable at this time Avoid nephrotoxic agents   Closed right spiral humeral fracture Orthopedic surgery recommending conservative management PT recommending hemiwalker and breg sling   Essential hypertension Continue metoprolol BP range 131/79-171/81, currently 147/75 Restart enalapril Monitor renal function   Hyponatremia Sodium 134 today Not clinically relevant   Hypothyroidism Continue Synthroid.   Dyslipidemia LDL 65 Continue simvastatin   PSVT (paroxysmal supraventricular tachycardia) Patient back on metoprolol   GERD without  esophagitis Continue PPI therapy   DNR Confirmed on admission [facility] documents reviewed       Consultants: Orthopedics Cardiology PT OT [facility] team   Procedures: Echocardiogram 2/24   Antibiotics: None    30 Day Unplanned Readmission Risk Score    Flowsheet Row ED to Hosp-Admission (Current) from 09/01/2023 in [facility] PCU  30 Day Unplanned Readmission Risk Score (%) 16.06 Filed at 09/09/2023 0801       This score is the patient's risk of an unplanned readmission within 30 days of being discharged (0 -100%). The score is based on dignosis, age, lab data, medications, orders, and past utilization.   Low:  0-14.9   Medium: 15-21.9   High: 22-29.9   Extreme: 30 and above           Subjective: Did better with PT today.  Feeling better. Ready to go to therapy.  Nearing medical readiness, hopefully tomorrow.   Objective: Vitals:   09/09/23 0538 09/09/23 0858  BP: (!) 141/80 (!) 147/75  Pulse: (!) 106 60  Resp: 16 18  Temp: 98.2 F (36.8 C) 98.6 F (37 C)  SpO2: 99% 99%    Intake/Output Summary (Last 24 hours) at 09/09/2023 1438 Last data filed at 09/09/2023 0132 Gross per 24 hour  Intake --  Output 2100 ml  Net -2100 ml   Filed Weights   09/01/23 1751  Weight: 62.6 kg    Exam:  General:  Appears calm and comfortable and is in NAD, up in bedside chair Eyes:  EOMI, normal lids, iris ENT:  grossly normal hearing, lips & tongue, mmm Cardiovascular:  RRR, no m/r/g. No LE edema.  Respiratory:   CTA bilaterally  with no wheezes/rales/rhonchi.  Normal respiratory effort. Abdomen:  soft, NT, ND Skin:  no rash or induration seen on limited exam Musculoskeletal:  RUE sling in place with splinting Psychiatric:  grossly normal mood and affect, speech fluent and appropriate, AOx3 Neurologic:  CN 2-12 grossly intact, moves all extremities in coordinated fashion  Data Reviewed: I have reviewed the patient's lab results since admission.  Pertinent  labs for today include:   Stable BMP WBC 5.9 Hgb 9.6, down from 10.5 Platelets 142     Family Communication: Daughter was present throughout  Disposition: Status is: Inpatient Remains inpatient appropriate because: ongoing management     Time spent: 50 minutes  Unresulted Labs (From admission, onward)     Start     Ordered   09/10/23 0500  CBC with Differential/Platelet  Tomorrow morning,   R       Question:  Specimen collection method  Answer:  Lab=Lab collect   09/09/23 0835   09/10/23 0500  Basic metabolic panel  Tomorrow morning,   R       Question:  Specimen collection method  Answer:  Lab=Lab collect   09/09/23 1610             Author: Jonah Blue, MD 09/09/2023 2:38 PM  For on call review [email or web address].

## 2023-09-09 NOTE — Plan of Care (Signed)

## 2023-09-09 NOTE — TOC Progression Note (Signed)
 Transition of Care [facility]) - Progression Note    Patient Details  Name: Kathleen Paul MRN: [id number] Date of Birth: 04-Jul-1936  Transition of [facility]) CM/SW Contact  Bing Quarry, RN Phone Number: 09/09/2023, 4:02 PM  Clinical Narrative:  3/2: May be medically ready on Monday per provider. Family chooses [facility] per provider. 4 pm. PASSR II still under review per Bradley Must.     Gabriel Cirri MSN RN CM  RN Case Manager [place]  Transitions of Care Direct Dial: [phone] (Weekends Only) [facility] Main Office Phone: [phone] [facility] Fax: [phone] [email or web address]         Expected Discharge Plan and Services                                               Social Determinants of Health (SDOH) Interventions SDOH Screenings   Food Insecurity: No Food Insecurity (09/02/2023)  Housing: Low Risk  (09/02/2023)  Transportation Needs: No Transportation Needs (09/02/2023)  Utilities: Not At Risk (09/02/2023)  Alcohol Screen: Low Risk  (12/18/2022)  Depression (PHQ2-9): Low Risk  (12/18/2022)  Financial Resource Strain: Low Risk  (12/18/2022)  Physical Activity: Inactive (12/18/2022)  Social Connections: Socially Isolated (09/02/2023)  Stress: No Stress Concern Present (12/18/2022)  Tobacco Use: Medium Risk (09/01/2023)    Readmission Risk Interventions     No data to display

## 2023-09-10 ENCOUNTER — Inpatient Hospital Stay

## 2023-09-10 DIAGNOSIS — D62 Acute posthemorrhagic anemia: Secondary | ICD-10-CM | POA: Diagnosis not present

## 2023-09-10 LAB — CBC WITH DIFFERENTIAL/PLATELET
Abs Immature Granulocytes: 0.47 10*3/uL — ABNORMAL HIGH (ref 0.00–0.07)
Basophils Absolute: 0 10*3/uL (ref 0.0–0.1)
Basophils Relative: 0 %
Eosinophils Absolute: 0.1 10*3/uL (ref 0.0–0.5)
Eosinophils Relative: 0 %
HCT: 32.3 % — ABNORMAL LOW (ref 36.0–46.0)
Hemoglobin: 10.9 g/dL — ABNORMAL LOW (ref 12.0–15.0)
Immature Granulocytes: 3 %
Lymphocytes Relative: 2 %
Lymphs Abs: 0.4 10*3/uL — ABNORMAL LOW (ref 0.7–4.0)
MCH: 30.1 pg (ref 26.0–34.0)
MCHC: 33.7 g/dL (ref 30.0–36.0)
MCV: 89.2 fL (ref 80.0–100.0)
Monocytes Absolute: 1.6 10*3/uL — ABNORMAL HIGH (ref 0.1–1.0)
Monocytes Relative: 9 %
Neutro Abs: 16.1 10*3/uL — ABNORMAL HIGH (ref 1.7–7.7)
Neutrophils Relative %: 86 %
Platelets: 163 10*3/uL (ref 150–400)
RBC: 3.62 MIL/uL — ABNORMAL LOW (ref 3.87–5.11)
RDW: 22.6 % — ABNORMAL HIGH (ref 11.5–15.5)
Smear Review: NORMAL
WBC: 18.7 10*3/uL — ABNORMAL HIGH (ref 4.0–10.5)
nRBC: 0 % (ref 0.0–0.2)

## 2023-09-10 LAB — BASIC METABOLIC PANEL
Anion gap: 10 (ref 5–15)
BUN: 40 mg/dL — ABNORMAL HIGH (ref 8–23)
CO2: 25 mmol/L (ref 22–32)
Calcium: 8.7 mg/dL — ABNORMAL LOW (ref 8.9–10.3)
Chloride: 98 mmol/L (ref 98–111)
Creatinine, Ser: 0.96 mg/dL (ref 0.44–1.00)
GFR, Estimated: 58 mL/min — ABNORMAL LOW (ref >60)
Glucose, Bld: 105 mg/dL — ABNORMAL HIGH (ref 70–99)
Potassium: 4.5 mmol/L (ref 3.5–5.1)
Sodium: 133 mmol/L — ABNORMAL LOW (ref 135–145)

## 2023-09-10 LAB — SARS CORONAVIRUS 2 BY RT PCR: SARS Coronavirus 2 by RT PCR: NEGATIVE

## 2023-09-10 MED ORDER — SODIUM CHLORIDE 0.9 % IV BOLUS
250.0000 mL | Freq: Once | INTRAVENOUS | Status: AC
  Administered 2023-09-10: 250 mL via INTRAVENOUS

## 2023-09-10 MED ORDER — IPRATROPIUM-ALBUTEROL 0.5-2.5 (3) MG/3ML IN SOLN
3.0000 mL | Freq: Four times a day (QID) | RESPIRATORY_TRACT | Status: DC
  Administered 2023-09-10 – 2023-09-11 (×5): 3 mL via RESPIRATORY_TRACT
  Filled 2023-09-10 (×6): qty 3

## 2023-09-10 MED ORDER — SODIUM CHLORIDE 0.9 % IV BOLUS
1000.0000 mL | Freq: Once | INTRAVENOUS | Status: AC
Start: 2023-09-10 — End: 2023-09-10
  Administered 2023-09-10: 1000 mL via INTRAVENOUS

## 2023-09-10 MED ORDER — AMIODARONE HCL IN DEXTROSE 360-4.14 MG/200ML-% IV SOLN: 30.0000 mg/h | INTRAVENOUS | Status: DC

## 2023-09-10 MED ORDER — AMIODARONE HCL IN DEXTROSE 360-4.14 MG/200ML-% IV SOLN
60.0000 mg/h | INTRAVENOUS | Status: DC
Start: 2023-09-10 — End: 2023-09-10

## 2023-09-10 MED ORDER — ALBUTEROL SULFATE (2.5 MG/3ML) 0.083% IN NEBU
2.5000 mg | INHALATION_SOLUTION | RESPIRATORY_TRACT | Status: DC | PRN
  Administered 2023-09-12: 2.5 mg via RESPIRATORY_TRACT
  Filled 2023-09-10: qty 3

## 2023-09-10 MED ORDER — GUAIFENESIN-DM 100-10 MG/5ML PO SYRP
15.0000 mL | ORAL_SOLUTION | ORAL | Status: DC | PRN
  Administered 2023-09-10 – 2023-09-17 (×13): 15 mL via ORAL
  Filled 2023-09-10 (×13): qty 20

## 2023-09-10 MED ORDER — AMIODARONE LOAD VIA INFUSION
150.0000 mg | Freq: Once | INTRAVENOUS | Status: DC
  Filled 2023-09-10: qty 83.34

## 2023-09-10 MED ORDER — SODIUM CHLORIDE 0.9 % IV SOLN
1.0000 g | INTRAVENOUS | Status: AC
Start: 2023-09-10 — End: 2023-09-14
  Administered 2023-09-10 – 2023-09-14 (×5): 1 g via INTRAVENOUS
  Filled 2023-09-10 (×5): qty 10

## 2023-09-10 MED ORDER — AZITHROMYCIN 500 MG PO TABS
500.0000 mg | ORAL_TABLET | Freq: Every day | ORAL | Status: AC
Start: 2023-09-10 — End: 2023-09-14
  Administered 2023-09-10 – 2023-09-14 (×5): 500 mg via ORAL
  Filled 2023-09-10 (×3): qty 1
  Filled 2023-09-10 (×2): qty 2

## 2023-09-10 NOTE — Progress Notes (Signed)
 MEWS Progress Note  Patient Details Name: Kathleen Paul MRN: [id number] DOB: September 25, 1935 Today's Date: 09/10/2023   MEWS Flowsheet Documentation:  Assess: MEWS Score Temp: 98.5 F (36.9 C) BP: (!) 148/90 MAP (mmHg): 97 Pulse Rate: (!) 101 ECG Heart Rate: (!) 111 Resp: 20 Level of Consciousness: Alert SpO2: 94 % O2 Device: Nasal Cannula Patient Activity (if Appropriate): In bed O2 Flow Rate (L/min): 2 L/min Assess: MEWS Score MEWS Temp: 0 MEWS Systolic: 0 MEWS Pulse: 1 MEWS RR: 0 MEWS LOC: 0 MEWS Score: 1 MEWS Score Color: Green Assess: SIRS CRITERIA SIRS Temperature : 0 SIRS Respirations : 0 SIRS Pulse: 1 SIRS WBC: 0 SIRS Score Sum : 1 SIRS Temperature : 0 SIRS Pulse: 1 SIRS Respirations : 0 SIRS WBC: 0 SIRS Score Sum : 1 Assess: if the MEWS score is Yellow or Red Were vital signs accurate and taken at a resting state?: Yes Does the patient meet 2 or more of the SIRS criteria?: No MEWS guidelines implemented : Yes, yellow Treat MEWS Interventions: Considered administering scheduled or prn medications/treatments as ordered Take Vital Signs Increase Vital Sign Frequency : Yellow: Q2hr x1, continue Q4hrs until patient remains green for 12hrs Escalate MEWS: Escalate: Yellow: Discuss with charge nurse and consider notifying provider and/or RRT    See previous note for yellow MEWs VS    Khara Renaud B Ivis Nicolson 09/10/2023, 12:09 AM

## 2023-09-10 NOTE — Plan of Care (Signed)
 Pt's VS earlier in the shift noted for elevated HR in 150s. Provider notified, EKG showed SVT with bundle branch block. Pt HR improved with 2 rounds of 5mg  IV metop. Pt stayed in 90s-low 100s throughout the night but this morning received a call from tele that patient hr was in 150s again. At this time SBP was 106 so provider asked to repeat EKG and attempt vagal maneuver. Attempted bearing down and blowing into an empty syringe with no improvement as patient was not bearing down or blowing hard and long enough. Ice in a ziplock bag applied to patient face for 15 sec and this help bring patient's HR down into low 100s. Pt has stayed in this range since intervention approx 1hr ago.    Problem: Education: Goal: Understanding of cardiac disease, CV risk reduction, and recovery process will improve Outcome: Progressing   Problem: Activity: Goal: Ability to tolerate increased activity will improve Outcome: Progressing   Problem: Clinical Measurements: Goal: Diagnostic test results will improve Outcome: Progressing   Problem: Elimination: Goal: Will not experience complications related to bowel motility Outcome: Progressing   Problem: Safety: Goal: Ability to remain free from injury will improve Outcome: Progressing

## 2023-09-10 NOTE — Plan of Care (Signed)

## 2023-09-10 NOTE — Progress Notes (Signed)
   09/09/23 2110  Assess: MEWS Score  Temp 98.4 F (36.9 C)  BP (!) 152/84  MAP (mmHg) 106  Pulse Rate (!) 152  Resp 16  Level of Consciousness Alert  SpO2 94 %  O2 Device Room Air  Assess: MEWS Score  MEWS Temp 0  MEWS Systolic 0  MEWS Pulse 3  MEWS RR 0  MEWS LOC 0  MEWS Score 3  MEWS Score Color Yellow  Assess: if the MEWS score is Yellow or Red  Were vital signs accurate and taken at a resting state? Yes  Does the patient meet 2 or more of the SIRS criteria? No  MEWS guidelines implemented  Yes, yellow  Treat  MEWS Interventions Considered administering scheduled or prn medications/treatments as ordered  Take Vital Signs  Increase Vital Sign Frequency  Yellow: Q2hr x1, continue Q4hrs until patient remains green for 12hrs  Escalate  MEWS: Escalate Yellow: Discuss with charge nurse and consider notifying provider and/or RRT  Notify: Charge Nurse/RN  Name of Charge Nurse/RN Notified Forensic scientist  Provider Notification  Provider Name/Title Geradine Girt NP  Date Provider Notified 09/09/23  Time Provider Notified 2111  Method of Notification  (secure chat seen right away)  Notification Reason Change in status (pt hr is in 150s; regular in rhythm. She is not on tele. she has sch 25mg  of oral matop due now but I wanted to see if you wanted to giver her IV metop and get an EKG. BP 152/84; RR 16 O2 stable 98%. She did have elevated trop this admission. plz advice?)  Provider response See new orders;Evaluate remotely  Date of Provider Response 09/10/23  Time of Provider Response 2112  Assess: SIRS CRITERIA  SIRS Temperature  0  SIRS Respirations  0  SIRS Pulse 1  SIRS WBC 0  SIRS Score Sum  1   IV metop 5mg  ordered by provider and given slowly  while monitoring.

## 2023-09-10 NOTE — Progress Notes (Signed)
 Physical Therapy Treatment Patient Details Name: Kathleen Paul MRN: [id number] DOB: 03-30-1936 Today's Date: 09/10/2023   History of Present Illness Kathleen Paul is an 87yoF who [facility] on [facility] after fall at home. Noted elevation of troponin as well. Foundtohave Acute mildly displaced fracture of the mid right humeral diaphysis; managed non surgically. 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    PT Comments  Aware of cardiac concerns this am.  Discussed with RN and pt wanting to sit EOB for a bit this afternoon.  Of note testing for Covid as family said self initiated at home test was positive today.  She is able to transition to/from sitting with heavy assist and remains sitting EOB x 15 minutes.  Self directed AROM with HR stable in 70-80's, she does report increase in general fatigue today and asks to return to supine after session.  No c/o dizziness with sitting despite soft BP.   If plan is discharge home, recommend the following: Assist for transportation;Help with stairs or ramp for entrance;Two people to help with walking and/or transfers;Two people to help with bathing/dressing/bathroom;Assistance with cooking/housework   Can travel by private vehicle        Equipment Recommendations       Recommendations for Other Services       Precautions / Restrictions Precautions Precautions: Other (comment) (severe OA) Recall of Precautions/Restrictions: Intact Required Braces or Orthoses: Splint/Cast;Sling;Other Brace Splint/Cast: RUE splint in 90 degrees. Noteable R hand swelling Other Brace:  (pt in standard sling. unable to wear shoulder immobilizer due to spint size. pt requesting breg style sling) Restrictions Weight Bearing Restrictions Per Provider Order: Yes RUE Weight Bearing Per Provider Order: Non weight bearing     Mobility  Bed Mobility Overal bed mobility: Needs  Assistance Bed Mobility: Supine to Sit, Sit to Supine     Supine to sit: HOB elevated, Mod assist, Max assist Sit to supine: Max assist        Transfers                   General transfer comment: deferred out of caution today    Ambulation/Gait                   Stairs             Wheelchair Mobility     Tilt Bed    Modified Rankin (Stroke Patients Only)       Balance Overall balance assessment: Needs assistance Sitting-balance support: No upper extremity supported, Feet supported Sitting balance-Leahy Scale: Good                                      Communication    Cognition Arousal: Alert Behavior During Therapy: WFL for tasks assessed/performed   PT - Cognitive impairments: No apparent impairments                                Cueing    Exercises Other Exercises Other Exercises: self directed seated AROM    General Comments        Pertinent Vitals/Pain Pain Assessment Pain Assessment: Faces Faces Pain Scale: Hurts even more Pain Location: arm Pain Descriptors / Indicators: Sore, Guarding Pain Intervention(s): Limited activity within  patient's tolerance, Monitored during session, Repositioned    Home Living                          Prior Function            PT Goals (current goals can now be found in the care plan section) Progress towards PT goals: Progressing toward goals    Frequency    7X/week      PT Plan      Co-evaluation              AM-PAC PT "6 Clicks" Mobility   Outcome Measure  Help needed turning from your back to your side while in a flat bed without using bedrails?: A Lot Help needed moving from lying on your back to sitting on the side of a flat bed without using bedrails?: A Lot Help needed moving to and from a bed to a chair (including a wheelchair)?: A Lot Help needed standing up from a chair using your arms (e.g., wheelchair or bedside  chair)?: A Lot Help needed to walk in hospital room?: Total Help needed climbing 3-5 steps with a railing? : Total 6 Click Score: 10    End of Session Equipment Utilized During Treatment: Oxygen Activity Tolerance: Patient tolerated treatment well;Patient limited by fatigue Patient left: in bed;with call bell/phone within reach;with bed alarm set;with family/visitor present;with nursing/sitter in room Nurse Communication: Mobility status PT Visit Diagnosis: Difficulty in walking, not elsewhere classified (R26.2);Other abnormalities of gait and mobility (R26.89);Repeated falls (R29.6);Muscle weakness (generalized) (M62.81)     Time: 1610-9604 PT Time Calculation (min) (ACUTE ONLY): 27 min  Charges:    $Therapeutic Exercise: 8-22 mins $Therapeutic Activity: 8-22 mins PT General Charges $$ ACUTE PT VISIT: 1 Visit                   Danielle Dess, PTA 09/10/23, 1:57 PM

## 2023-09-10 NOTE — Progress Notes (Signed)
 Spoke with family today Cordelia Pen).  They have requested a visit from [facility] regarding rehabilitation for patient.  Contacted TOC and requested someone call today and visit the patient/family tomorrow (if unable to visit today).

## 2023-09-10 NOTE — Progress Notes (Signed)
 Progress Note   Patient: Kathleen Paul:[id number] DOB: Jan 22, 1936 DOA: 09/01/2023     9 DOS: the patient was seen and examined on 09/10/2023   Brief hospital course: 87yo with h/o anxiety, gastric ulcer, HLD, DVT, HTN, and CAD who presented on 2/22 with a mechanical fall.  She was found to have a R humerus fracture and orthopedics was consulted.  She was also noted to have elevated troponin (25 -> 410 -> 1431, now downtrending) and cardiology was consulted, started heparin.  Developed ABLA with CT showing concern for humerus bleeding and heparin was stopped.  Transfused 3 units PRBC.  Echo without WMA, cardiology recommended conservative management.    Assessment and Plan:  Mechanical fall Patient presented with mechanical fall that resulted in humerus fracture (nonoperative) She has had generalized weakness and will require SNF rehab   Acute blood loss anemia  Patient received 3 units total of packed red blood cells during the hospital course last being on 2/27 Patient does have bruising on the right arm CT scan showing hematoma right arm Stable now   Elevated troponin No chest pain  Troponin peaked at 1431 Likely related to demand ischemia Cardiology recommended conservative management Patient on aspirin, metoprolol, simvastatin   Stage 3a CKD Appears stable at this time Avoid nephrotoxic agents   Closed right spiral humeral fracture Orthopedic surgery recommending conservative management PT recommending hemiwalker and breg sling   Essential hypertension On metoprolol BP range today 80/45-93/55 Will again hold enalapril Continue metoprolol IV prn only, hold standing metoprolol for now (had SVT overnight) Will bolus 1L at this time and continue to closely monitor on telemetry in progressive care  Cough Family concerned about COVID although PCR is negative CXR with atelectasis vs. LLL infiltrate Will start Ceftriaxone/Azithromycin at this time and continue to closely  monitor Change duonebs to standing, give albuterol q2h prn   Hyponatremia Sodium 134 today Not clinically relevant   Hypothyroidism Continue Synthroid   Dyslipidemia LDL 65 Continue simvastatin   PSVT (paroxysmal supraventricular tachycardia) Patient started back on metoprolol Had SVT overnight to 152 Given IV metoprolol with resolution but elevated again this AM Given metoprolol PO this AM but has had marginal BPs through the day Will bolus and hold PO metoprolol for now, give IV if needed   GERD without esophagitis Continue PPI therapy   DNR Confirmed on admission [facility] documents reviewed       Consultants: Orthopedics Cardiology PT OT [facility] team   Procedures: Echocardiogram 2/24   Antibiotics: Ceftriaxone 3/3-7 Azithromycin 3/3-7  30 Day Unplanned Readmission Risk Score    Flowsheet Row ED to Hosp-Admission (Current) from 09/01/2023 in [facility] REGIONAL CARDIAC MED PCU  30 Day Unplanned Readmission Risk Score (%) 18.69 Filed at 09/10/2023 0801       This score is the patient's risk of an unplanned readmission within 30 days of being discharged (0 -100%). The score is based on dignosis, age, lab data, medications, orders, and past utilization.   Low:  0-14.9   Medium: 15-21.9   High: 22-29.9   Extreme: 30 and above           Subjective: More congested today, fatigued, hoarse.  Family reports that she was in a room with a COVID patient a week ago and that her daughter tested her this AM and she was positive.  COVID testing here negative today.   Objective: Vitals:   09/10/23 0833 09/10/23 1147  BP: 92/63 (!) 80/45  Pulse: (!) 143 (!) 119  Resp:    Temp:  98.2 F (36.8 C)  SpO2:  96%    Intake/Output Summary (Last 24 hours) at 09/10/2023 1550 Last data filed at 09/09/2023 2322 Gross per 24 hour  Intake 120 ml  Output 650 ml  Net -530 ml   Filed Weights   09/01/23 1751  Weight: 62.6 kg    Exam:  General:  Appears more ill today, seen  while in bed for the first time, hoarse voice Eyes:  normal lids, iris ENT:  grossly normal hearing, lips & tongue, mmm Cardiovascular:  RRR. No LE edema.  Respiratory:   Scattered rhonchi with some expiratory wheezing.  Normal respiratory effort. Abdomen:  soft, NT, ND Skin:  no rash or induration seen on limited exam Musculoskeletal:  RUE sling in place with splinting Psychiatric:  grossly normal mood and affect, speech fluent and appropriate, AOx3 Neurologic:  CN 2-12 grossly intact, moves all extremities in coordinated fashion  Data Reviewed: I have reviewed the patient's lab results since admission.  Pertinent labs for today include:   Na++ 133 Glucose 105 BUN 40/Creatinine 0.96/GFR 58 - stable WBC 18.7, up from 5.9 on 3/2 Hgb 10.9     Family Communication: Family member was present throughout evaluation  Disposition: Status is: Inpatient Remains inpatient appropriate because: ongoing management     Time spent: 50 minutes  Unresulted Labs (From admission, onward)     Start     Ordered   Unscheduled  CBC with Differential/Platelet  Tomorrow morning,   R       Question:  Specimen collection method  Answer:  Lab=Lab collect   09/10/23 1550   Unscheduled  Basic metabolic panel  Tomorrow morning,   R       Question:  Specimen collection method  Answer:  Lab=Lab collect   09/10/23 1550             Author: Jonah Blue, MD 09/10/2023 3:50 PM  For on call review [email or web address].

## 2023-09-10 NOTE — Plan of Care (Signed)
  Problem: Education: Goal: Understanding of cardiac disease, CV risk reduction, and recovery process will improve Outcome: Progressing Goal: Individualized Educational Video(s) Outcome: Progressing   Problem: Activity: Goal: Ability to tolerate increased activity will improve Outcome: Progressing   Problem: Cardiac: Goal: Ability to achieve and maintain adequate cardiovascular perfusion will improve Outcome: Progressing   Problem: Health Behavior/Discharge Planning: Goal: Ability to safely manage health-related needs after discharge will improve Outcome: Progressing   Problem: Education: Goal: Knowledge of General Education information will improve Description: Including pain rating scale, medication(s)/side effects and non-pharmacologic comfort measures Outcome: Progressing   Problem: Clinical Measurements: Goal: Ability to maintain clinical measurements within normal limits will improve Outcome: Progressing Goal: Will remain free from infection Outcome: Progressing Goal: Diagnostic test results will improve Outcome: Progressing Goal: Respiratory complications will improve Outcome: Progressing Goal: Cardiovascular complication will be avoided Outcome: Progressing   Problem: Health Behavior/Discharge Planning: Goal: Ability to manage health-related needs will improve Outcome: Progressing   Problem: Activity: Goal: Risk for activity intolerance will decrease Outcome: Progressing   Problem: Nutrition: Goal: Adequate nutrition will be maintained Outcome: Progressing   Problem: Coping: Goal: Level of anxiety will decrease Outcome: Progressing   Problem: Elimination: Goal: Will not experience complications related to bowel motility Outcome: Progressing Goal: Will not experience complications related to urinary retention Outcome: Progressing   Problem: Pain Managment: Goal: General experience of comfort will improve and/or be controlled Outcome: Progressing    Problem: Safety: Goal: Ability to remain free from injury will improve Outcome: Progressing   Problem: Skin Integrity: Goal: Risk for impaired skin integrity will decrease Outcome: Progressing

## 2023-09-11 DIAGNOSIS — D62 Acute posthemorrhagic anemia: Secondary | ICD-10-CM | POA: Diagnosis not present

## 2023-09-11 LAB — CBC WITH DIFFERENTIAL/PLATELET
Abs Immature Granulocytes: 0.4 10*3/uL — ABNORMAL HIGH (ref 0.00–0.07)
Basophils Absolute: 0 10*3/uL (ref 0.0–0.1)
Basophils Relative: 0 %
Eosinophils Absolute: 0 10*3/uL (ref 0.0–0.5)
Eosinophils Relative: 0 %
HCT: 25.6 % — ABNORMAL LOW (ref 36.0–46.0)
Hemoglobin: 8.6 g/dL — ABNORMAL LOW (ref 12.0–15.0)
Immature Granulocytes: 3 %
Lymphocytes Relative: 3 %
Lymphs Abs: 0.4 10*3/uL — ABNORMAL LOW (ref 0.7–4.0)
MCH: 30.1 pg (ref 26.0–34.0)
MCHC: 33.6 g/dL (ref 30.0–36.0)
MCV: 89.5 fL (ref 80.0–100.0)
Monocytes Absolute: 1.3 10*3/uL — ABNORMAL HIGH (ref 0.1–1.0)
Monocytes Relative: 9 %
Neutro Abs: 13.5 10*3/uL — ABNORMAL HIGH (ref 1.7–7.7)
Neutrophils Relative %: 85 %
Platelets: 146 10*3/uL — ABNORMAL LOW (ref 150–400)
RBC: 2.86 MIL/uL — ABNORMAL LOW (ref 3.87–5.11)
RDW: 22.5 % — ABNORMAL HIGH (ref 11.5–15.5)
WBC: 15.7 10*3/uL — ABNORMAL HIGH (ref 4.0–10.5)
nRBC: 0 % (ref 0.0–0.2)

## 2023-09-11 LAB — BASIC METABOLIC PANEL
Anion gap: 7 (ref 5–15)
BUN: 50 mg/dL — ABNORMAL HIGH (ref 8–23)
CO2: 23 mmol/L (ref 22–32)
Calcium: 8.1 mg/dL — ABNORMAL LOW (ref 8.9–10.3)
Chloride: 100 mmol/L (ref 98–111)
Creatinine, Ser: 1.09 mg/dL — ABNORMAL HIGH (ref 0.44–1.00)
GFR, Estimated: 50 mL/min — ABNORMAL LOW (ref >60)
Glucose, Bld: 97 mg/dL (ref 70–99)
Potassium: 4.5 mmol/L (ref 3.5–5.1)
Sodium: 130 mmol/L — ABNORMAL LOW (ref 135–145)

## 2023-09-11 MED ORDER — IPRATROPIUM-ALBUTEROL 0.5-2.5 (3) MG/3ML IN SOLN
3.0000 mL | Freq: Three times a day (TID) | RESPIRATORY_TRACT | Status: DC
  Administered 2023-09-12: 3 mL via RESPIRATORY_TRACT
  Filled 2023-09-11: qty 3

## 2023-09-11 NOTE — Progress Notes (Addendum)
 Progress Note   Patient: Kathleen Paul:[id number] DOB: 1935-12-03 DOA: 09/01/2023     10 DOS: the patient was seen and examined on 09/11/2023   Brief hospital course: 87yo with h/o anxiety, gastric ulcer, HLD, DVT, HTN, and CAD who presented on 2/22 with a mechanical fall.  She was found to have a R humerus fracture and orthopedics was consulted.  She was also noted to have elevated troponin (25 -> 410 -> 1431, now downtrending) and cardiology was consulted, started heparin.  Developed ABLA with CT showing concern for humerus bleeding and heparin was stopped.  Transfused 3 units PRBC.  Echo without WMA, cardiology recommended conservative management.    Assessment and Plan:  Mechanical fall Patient presented with mechanical fall that resulted in humerus fracture (nonoperative) She has had generalized weakness and will require SNF rehab   Acute blood loss anemia  Patient received 3 units total of packed red blood cells during the hospital course last being on 2/27 Patient does have bruising on the right arm CT scan showing hematoma right arm Hgb 8.6 today, down from 10.9 on 3/3 and 9.6 on 3/2 Recheck CBC in AM   Elevated troponin No chest pain  Troponin peaked at 1431 Likely related to demand ischemia Cardiology recommended conservative management Patient on aspirin, metoprolol, simvastatin   Stage 3a CKD Appears stable at this time Avoid nephrotoxic agents   Closed right spiral humeral fracture Orthopedic surgery recommending conservative management PT recommending hemiwalker and breg sling Taking Os-Cal for presumed associated osteoporosis   Essential hypertension On metoprolol, currently being held BP range today 109/69-117/73 Will again hold enalapril Continue metoprolol IV prn only, hold standing metoprolol for now  It may be reasonable to resume metoprolol tomorrow if BP continues to normalize  LLL PNA COVID negative CXR with atelectasis vs. LLL  infiltrate Continue Ceftriaxone/Azithromycin for now, as she is improving Change duonebs to standing, give albuterol q2h prn   Hyponatremia Sodium 130 today, possibly related to hypovolemia in the setting of infection yesterday Recheck CMP in AM   Hypothyroidism Continue Synthroid   Dyslipidemia LDL 65 Continue simvastatin   PSVT (paroxysmal supraventricular tachycardia) Patient started back on metoprolol due to recurrent SVT to 152 Metoprolol held due to marginal BPs, but this is a careful balance due to recurrent SVT Hold PO metoprolol for now, give IV if needed Consider restarting metoprolol in AM   GERD without esophagitis Continue PPI therapy   DNR Confirmed on admission [facility] documents reviewed       Consultants: Orthopedics Cardiology PT OT [facility] team   Procedures: Echocardiogram 2/24   Antibiotics: Ceftriaxone 3/3-7 Azithromycin 3/3-7    30 Day Unplanned Readmission Risk Score    Flowsheet Row ED to Hosp-Admission (Current) from 09/01/2023 in [facility]ION[facility] C[facility]IAC MED PCU  30 Day Unplanned Readmission Risk Score (%) 20.19 Filed at 09/11/2023 0801       This score is the patient's risk of an unplanned readmission within 30 days of being discharged (0 -100%). The score is based on dignosis, age, lab data, medications, orders, and past utilization.   Low:  0-14.9   Medium: 15-21.9   High: 22-29.9   Extreme: 30 and above           Subjective: She is clearly better today.  More alert/awake, conversant, ongoing mild cough, less hoarseness.   Objective: Vitals:   09/11/23 0937 09/11/23 1251  BP: (!) 109/59 117/73  Pulse: (!) 101 93  Resp:    Temp: 97.9  F (36.6 C) 98.4 F (36.9 C)  SpO2: 99% 96%    Intake/Output Summary (Last 24 hours) at 09/11/2023 1403 Last data filed at 09/11/2023 1052 Gross per 24 hour  Intake 480 ml  Output 1300 ml  Net -820 ml   Filed Weights   09/01/23 1751  Weight: 62.6 kg    Exam:  General:  Appears  much better, sitting up in chair (on bedpan), hoarse voice is improving Eyes:  normal lids, iris ENT:  grossly normal hearing, lips & tongue, mmm Cardiovascular:  RRR. No LE edema.  Respiratory:   Improved air movement, CTA.  Normal respiratory effort. Abdomen:  soft, NT, ND Skin:  no rash or induration seen on limited exam Musculoskeletal:  RUE sling in place with splinting; significant B bunions causing foot deformities Psychiatric:  blunted mood and affect, speech fluent and appropriate, AOx3 Neurologic:  CN 2-12 grossly intact, moves all extremities in coordinated fashion other than RUE  Data Reviewed: I have reviewed the patient's lab results since admission.  Pertinent labs for today include:   Na++ 130 BUN 50/Creatinine 1.09/GFR 50 WBC 15.7, down from 18.7 Hgb 8.6, down from 10.9 COVID negative    Family Communication: None present initially; I went back by and spoke with her daughter later in the day  Disposition: Status is: Inpatient Remains inpatient appropriate because: needs ongoing monitoring and then placement     Time spent: 50 minutes  Unresulted Labs (From admission, onward)     Start     Ordered   09/12/23 0500  CBC with Differential/Platelet  Tomorrow morning,   R       Question:  Specimen collection method  Answer:  Lab=Lab collect   09/11/23 1403   09/12/23 0500  Basic metabolic panel  Tomorrow morning,   R       Question:  Specimen collection method  Answer:  Lab=Lab collect   09/11/23 1403             Author: Jonah Blue, MD 09/11/2023 2:03 PM  For on call review [email or web address].

## 2023-09-11 NOTE — Progress Notes (Signed)
 Physical Therapy Treatment Patient Details Name: Kathleen Paul MRN: [id number] DOB: 1936-01-07 Today's Date: 09/11/2023   History of Present Illness Kathleen Paul is an 87yoF who comes to [facility] on 2/22 after fall at home. Noted elevation of troponin as well. Foundtohave Acute mildly displaced fracture of the mid right humeral diaphysis; managed non surgically. 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    PT Comments  Pt in bed.  Ready for session.  Reports she now has pneumonia and is feeling generally weak (in house Covid -).  She is able to get to EOB with mod/max A x 1 with HOB elevated.  Steady in sitting.  Stands x 3 at EOB but never fully clears hips to gain balance.  On 4th attempt, she is able to stand fully with HW and pivots to chair with max a x 1 of Clinical research associate.  Assist to reposition in chair for comfort and remain up. Pt stated she enjoys and is more comfortable in chair.  Discussed with RN.  Will need +2 to return to bed and to use stedy lift if needed.     If plan is discharge home, recommend the following: Assist for transportation;Help with stairs or ramp for entrance;Two people to help with walking and/or transfers;Two people to help with bathing/dressing/bathroom;Assistance with cooking/housework   Can travel by private vehicle        Equipment Recommendations       Recommendations for Other Services       Precautions / Restrictions Precautions Precautions: Other (comment) (severe OA) Recall of Precautions/Restrictions: Intact Required Braces or Orthoses: Splint/Cast;Sling;Other Brace Splint/Cast: RUE splint in 90 degrees. Noteable R hand swelling Other Brace:  (pt in standard sling. unable to wear shoulder immobilizer due to spint size. pt requesting breg style sling) Restrictions Weight Bearing Restrictions Per Provider Order: Yes RUE Weight Bearing Per Provider Order: Non weight  bearing     Mobility  Bed Mobility Overal bed mobility: Needs Assistance Bed Mobility: Supine to Sit     Supine to sit: HOB elevated, Mod assist, Max assist       Patient Response: Cooperative  Transfers Overall transfer level: Needs assistance Equipment used: Hemi-walker Transfers: Sit to/from Stand Sit to Stand: Max assist, Mod assist     Squat pivot transfers: Max assist     General transfer comment: pleased to be up in chair today    Ambulation/Gait         Gait velocity: decreased     General Gait Details: no ture steps, just pivoted by Dance movement psychotherapist     Tilt Bed Tilt Bed Patient Response: Cooperative  Modified Rankin (Stroke Patients Only)       Balance Overall balance assessment: Needs assistance Sitting-balance support: No upper extremity supported, Feet supported Sitting balance-Leahy Scale: Good Sitting balance - Comments: no difficulty or LOB while seated EOB.   Standing balance support: Single extremity supported, During functional activity, Reliant on assistive device for balance Standing balance-Leahy Scale: Poor                              Communication    Cognition Arousal: Alert Behavior During Therapy: WFL for tasks assessed/performed   PT - Cognitive impairments: No apparent impairments  Cueing    Exercises      General Comments        Pertinent Vitals/Pain Pain Assessment Pain Assessment: Faces Faces Pain Scale: Hurts even more Pain Location: arm Pain Descriptors / Indicators: Sore, Guarding Pain Intervention(s): Limited activity within patient's tolerance, Monitored during session, Repositioned    Home Living                          Prior Function            PT Goals (current goals can now be found in the care plan section) Progress towards PT goals: Progressing toward goals    Frequency     7X/week      PT Plan      Co-evaluation              AM-PAC PT "6 Clicks" Mobility   Outcome Measure  Help needed turning from your back to your side while in a flat bed without using bedrails?: A Lot Help needed moving from lying on your back to sitting on the side of a flat bed without using bedrails?: A Lot Help needed moving to and from a bed to a chair (including a wheelchair)?: A Lot Help needed standing up from a chair using your arms (e.g., wheelchair or bedside chair)?: A Lot Help needed to walk in hospital room?: Total Help needed climbing 3-5 steps with a railing? : Total 6 Click Score: 10    End of Session Equipment Utilized During Treatment: Oxygen Activity Tolerance: Patient tolerated treatment well;Patient limited by fatigue Patient left: in bed;with call bell/phone within reach;with bed alarm set;with family/visitor present;with nursing/sitter in room Nurse Communication: Mobility status PT Visit Diagnosis: Difficulty in walking, not elsewhere classified (R26.2);Other abnormalities of gait and mobility (R26.89);Repeated falls (R29.6);Muscle weakness (generalized) (M62.81)     Time: 1610-9604 PT Time Calculation (min) (ACUTE ONLY): 17 min  Charges:    $Therapeutic Activity: 8-22 mins PT General Charges $$ ACUTE PT VISIT: 1 Visit                   Danielle Dess, PTA 09/11/23, 9:42 AM

## 2023-09-11 NOTE — TOC PASRR Note (Signed)
 RE: Kathleen Paul Date of Birth: 12/24/1935 Date: 09/11/2023   To Whom It May Concern:  Please be advised that the above-named patient will require a short-term nursing home stay - anticipated 30 days or less for rehabilitation and strengthening.  The plan is for return home.

## 2023-09-11 NOTE — Plan of Care (Signed)

## 2023-09-11 NOTE — TOC Progression Note (Addendum)
 Transition of Care [facility]) - Progression Note    Patient Details  Name: Kathleen Paul MRN: [id number] Date of Birth: 05/05/1936  Transition of Care [facility]) CM/SW Contact  Margarito Liner, LCSW Phone Number: 09/11/2023, 9:50 AM  Clinical Narrative:  CSW sent SNF referral out to local facilities. CSW spoke with social worker at [facility] who stated they are currently full.   10:22 am: CSW asked MD to cosign 30-day note for PASARR.  2:55 pm: Uploaded clinicals into [facility] [facility] for PASARR review. Patient is asleep. Daughter at bedside. CSW provided bed offers for them to review.  3:56 pm: PASARR obtained: [id number]. Expires 4/3.  Expected Discharge Plan and Services                                               Social Determinants of Health (SDOH) Interventions SDOH Screenings   Food Insecurity: No Food Insecurity (09/02/2023)  Housing: Low Risk  (09/02/2023)  Transportation Needs: No Transportation Needs (09/02/2023)  Utilities: Not At Risk (09/02/2023)  Alcohol Screen: Low Risk  (12/18/2022)  Depression (PHQ2-9): Low Risk  (12/18/2022)  Financial Resource Strain: Low Risk  (12/18/2022)  Physical Activity: Inactive (12/18/2022)  Social Connections: Socially Isolated (09/02/2023)  Stress: No Stress Concern Present (12/18/2022)  Tobacco Use: Medium Risk (09/01/2023)    Readmission Risk Interventions     No data to display

## 2023-09-11 NOTE — Progress Notes (Signed)
 Occupational Therapy Treatment Patient Details Name: Kathleen Paul MRN: [id number] DOB: 06/29/1936 Today's Date: 09/11/2023   History of present illness Kathleen Paul is an 87yoF who comes to [facility] on 2/22 after fall at home. Noted elevation of troponin as well. Foundtohave Acute mildly displaced fracture of the mid right humeral diaphysis; managed non surgically. 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   OT comments  Upon entering the room, pt seated in recliner chair on bed pan and asking assistance to get cleaned up. Pt decline attempts to stand and transfer and wants to remain in recliner chair during session. Chair positioned next the bed with pt rolling to the L with max A to remove bed pan. Total A to wipe buttocks but pt able to cleanse peri area with set up A to obtain needed items. Pt repositioned in recliner chair with +2 assistance. Pt requesting to remain in recliner chair until after lunch. Call bell and all needed items within reach upon exiting the room.       If plan is discharge home, recommend the following:  Two people to help with walking and/or transfers;Two people to help with bathing/dressing/bathroom;Help with stairs or ramp for entrance   Equipment Recommendations  BSC/3in1;Wheelchair (measurements OT)       Precautions / Restrictions Precautions Precautions: Other (comment) Required Braces or Orthoses: Splint/Cast;Sling;Other Brace Splint/Cast: RUE splint in 90 degrees. Noteable R hand swelling Restrictions Weight Bearing Restrictions Per Provider Order: Yes RUE Weight Bearing Per Provider Order: Non weight bearing       Mobility Bed Mobility               General bed mobility comments: seated in recliner chair    Transfers                   General transfer comment: Pt declined         ADL either performed or assessed with clinical judgement    ADL Overall ADL's : Needs assistance/impaired                                       General ADL Comments: Upon entering the room,pt on bed pan while seated in recliner chair. Pt rolls with max A to remove and total A to wipe buttocks but she is able to wipe peri area.     Vision Patient Visual Report: No change from baseline           Communication Communication Communication: No apparent difficulties   Cognition Arousal: Alert Behavior During Therapy: WFL for tasks assessed/performed Cognition: No apparent impairments                               Following commands: Intact        Cueing   Cueing Techniques: Verbal cues, Tactile cues             Pertinent Vitals/ Pain       Pain Assessment Pain Assessment: Faces Faces Pain Scale: Hurts little more Pain Location: R UE Pain Descriptors / Indicators: Sore, Guarding, Aching Pain Intervention(s): Limited activity within patient's tolerance, Monitored during session, Repositioned         Frequency  Min 1X/week        Progress  Toward Goals  OT Goals(current goals can now be found in the care plan section)  Progress towards OT goals: Progressing toward goals            AM-PAC OT "6 Clicks" Daily Activity     Outcome Measure   Help from another person eating meals?: None Help from another person taking care of personal grooming?: A Little Help from another person toileting, which includes using toliet, bedpan, or urinal?: A Lot Help from another person bathing (including washing, rinsing, drying)?: A Lot Help from another person to put on and taking off regular upper body clothing?: A Little Help from another person to put on and taking off regular lower body clothing?: A Lot 6 Click Score: 16    End of Session    OT Visit Diagnosis: Unsteadiness on feet (R26.81)   Activity Tolerance Patient tolerated treatment well   Patient Left in chair;with call bell/phone within  reach;with chair alarm set   Nurse Communication Mobility status        Time: 8295-6213 OT Time Calculation (min): 24 min  Charges: OT General Charges $OT Visit: 1 Visit OT Treatments $Self Care/Home Management : 23-37 mins  Jackquline Denmark, MS, OTR/L , CBIS ascom [phone]  09/11/23, 3:02 PM

## 2023-09-12 DIAGNOSIS — D62 Acute posthemorrhagic anemia: Secondary | ICD-10-CM | POA: Diagnosis not present

## 2023-09-12 LAB — BASIC METABOLIC PANEL
Anion gap: 8 (ref 5–15)
BUN: 42 mg/dL — ABNORMAL HIGH (ref 8–23)
CO2: 24 mmol/L (ref 22–32)
Calcium: 8.4 mg/dL — ABNORMAL LOW (ref 8.9–10.3)
Chloride: 102 mmol/L (ref 98–111)
Creatinine, Ser: 0.94 mg/dL (ref 0.44–1.00)
GFR, Estimated: 59 mL/min — ABNORMAL LOW (ref >60)
Glucose, Bld: 108 mg/dL — ABNORMAL HIGH (ref 70–99)
Potassium: 4.5 mmol/L (ref 3.5–5.1)
Sodium: 134 mmol/L — ABNORMAL LOW (ref 135–145)

## 2023-09-12 LAB — CBC WITH DIFFERENTIAL/PLATELET
Abs Immature Granulocytes: 0.31 10*3/uL — ABNORMAL HIGH (ref 0.00–0.07)
Basophils Absolute: 0 10*3/uL (ref 0.0–0.1)
Basophils Relative: 0 %
Eosinophils Absolute: 0 10*3/uL (ref 0.0–0.5)
Eosinophils Relative: 0 %
HCT: 27.1 % — ABNORMAL LOW (ref 36.0–46.0)
Hemoglobin: 9 g/dL — ABNORMAL LOW (ref 12.0–15.0)
Immature Granulocytes: 3 %
Lymphocytes Relative: 3 %
Lymphs Abs: 0.4 10*3/uL — ABNORMAL LOW (ref 0.7–4.0)
MCH: 29.9 pg (ref 26.0–34.0)
MCHC: 33.2 g/dL (ref 30.0–36.0)
MCV: 90 fL (ref 80.0–100.0)
Monocytes Absolute: 1.2 10*3/uL — ABNORMAL HIGH (ref 0.1–1.0)
Monocytes Relative: 10 %
Neutro Abs: 10.4 10*3/uL — ABNORMAL HIGH (ref 1.7–7.7)
Neutrophils Relative %: 84 %
Platelets: 160 10*3/uL (ref 150–400)
RBC: 3.01 MIL/uL — ABNORMAL LOW (ref 3.87–5.11)
RDW: 22.6 % — ABNORMAL HIGH (ref 11.5–15.5)
Smear Review: NORMAL
WBC: 12.4 10*3/uL — ABNORMAL HIGH (ref 4.0–10.5)
nRBC: 0 % (ref 0.0–0.2)

## 2023-09-12 MED ORDER — GUAIFENESIN ER 600 MG PO TB12
600.0000 mg | ORAL_TABLET | Freq: Two times a day (BID) | ORAL | Status: DC
  Administered 2023-09-12 – 2023-09-19 (×14): 600 mg via ORAL
  Filled 2023-09-12 (×14): qty 1

## 2023-09-12 MED ORDER — BENZONATATE 100 MG PO CAPS
200.0000 mg | ORAL_CAPSULE | Freq: Three times a day (TID) | ORAL | Status: DC
  Administered 2023-09-12 – 2023-09-19 (×21): 200 mg via ORAL
  Filled 2023-09-12 (×21): qty 2

## 2023-09-12 MED ORDER — METOPROLOL TARTRATE 25 MG PO TABS
12.5000 mg | ORAL_TABLET | Freq: Two times a day (BID) | ORAL | Status: DC
Start: 2023-09-12 — End: 2023-09-19
  Administered 2023-09-12 – 2023-09-19 (×14): 12.5 mg via ORAL
  Filled 2023-09-12 (×14): qty 1

## 2023-09-12 NOTE — TOC Progression Note (Signed)
 Transition of Care [facility]) - Progression Note    Patient Details  Name: Kathleen Paul MRN: [id number] Date of Birth: 1935/12/11  Transition of Care [facility]) CM/SW Contact  [name], RN Phone Number: 09/12/2023, 10:07 AM  Clinical Narrative:     Spoke with daughter [name] on the phone and reviewed the bed offer, I let her know that [facility] does not have an available bed, We reviewed each bed offer and she then asked that I reach out to see what is available in [place], The bedsearch was extended to [employer], she asked what is available at home, I explained that [facility] will cover 20 hours total of PCS service and HH can come 2-3 times per week, I explained to have a [facility] nurse she would need a nursing need  She stated understanding and asked that we continue to STR seach in [place], I extended the bed search to the [place] area       Expected Discharge Plan and Services                                               Social Determinants of Health (SDOH) Interventions SDOH Screenings   Food Insecurity: No Food Insecurity (09/02/2023)  Housing: Low Risk  (09/02/2023)  Transportation Needs: No Transportation Needs (09/02/2023)  Utilities: Not At Risk (09/02/2023)  Alcohol Screen: Low Risk  (12/18/2022)  Depression (PHQ2-9): Low Risk  (12/18/2022)  Financial Resource Strain: Low Risk  (12/18/2022)  Physical Activity: Inactive (12/18/2022)  Social Connections: Socially Isolated (09/02/2023)  Stress: No Stress Concern Present (12/18/2022)  Tobacco Use: Medium Risk (09/01/2023)    Readmission Risk Interventions     No data to display

## 2023-09-12 NOTE — Plan of Care (Signed)

## 2023-09-12 NOTE — TOC Progression Note (Signed)
 Transition of Care [facility]) - Progression Note    Patient Details  Name: Kathleen Paul MRN: [id number] Date of Birth: July 31, 1935  Transition of Care [facility]) CM/SW Contact  [name], RN Phone Number: 09/12/2023, 2:39 PM  Clinical Narrative:    Called daughter [name] and left a HIPAA Compliant VM asking for a call back, Need to review bed offers in [place] with her for the patient, the patient will then need Ins approval to go to rehab        Expected Discharge Plan and Services                                               Social Determinants of Health (SDOH) Interventions SDOH Screenings   Food Insecurity: No Food Insecurity (09/02/2023)  Housing: Low Risk  (09/02/2023)  Transportation Needs: No Transportation Needs (09/02/2023)  Utilities: Not At Risk (09/02/2023)  Alcohol Screen: Low Risk  (12/18/2022)  Depression (PHQ2-9): Low Risk  (12/18/2022)  Financial Resource Strain: Low Risk  (12/18/2022)  Physical Activity: Inactive (12/18/2022)  Social Connections: Socially Isolated (09/02/2023)  Stress: No Stress Concern Present (12/18/2022)  Tobacco Use: Medium Risk (09/01/2023)    Readmission Risk Interventions     No data to display

## 2023-09-12 NOTE — Progress Notes (Signed)
 Physical Therapy Treatment Patient Details Name: Kathleen Paul MRN: [id number] DOB: 1936/03/15 Today's Date: 09/12/2023   History of Present Illness Kathleen Paul is an 87yoF who comes to [facility] on 2/22 after fall at home. Noted elevation of troponin as well. Foundtohave Acute mildly displaced fracture of the mid right humeral diaphysis; managed non surgically. 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    PT Comments  Pt was long sitting in bed upon arrival. She is A and O x 4 and eager to get OOB to recliner. Pt requires more assistance today versus ~ 3 days prior. Pt stood EOB 1 x Max-total assist of one. Author did not feel safe attempting step/pivot to recliner. Pt was however to use slide board transfer to get to recliner with max assist +1 and vcing throughout for technique and safety. Acute PT will continue to follow and progress per current POC.     If plan is discharge home, recommend the following: A lot of help with walking and/or transfers;A lot of help with bathing/dressing/bathroom;Assist for transportation;Help with stairs or ramp for entrance     Equipment Recommendations   (Hemi-walker)       Precautions / Restrictions Precautions Precautions: None Recall of Precautions/Restrictions: Intact Required Braces or Orthoses: Splint/Cast;Sling;Other Brace (shoulder immobilizer and sling) Splint/Cast: RUE splint in 90 degrees. Noteable R hand swelling Restrictions Weight Bearing Restrictions Per Provider Order: Yes RUE Weight Bearing Per Provider Order: Non weight bearing     Mobility  Bed Mobility Overal bed mobility: Needs Assistance Bed Mobility: Supine to Sit  Supine to sit: HOB elevated, Mod assist, Max assist  General bed mobility comments: Pt was able to exit bed with mod-max assist of one. increased time. Pt severely limited by NWB/ pneumonia    Transfers Overall transfer  level: Needs assistance Equipment used: Hemi-walker (stedy) Transfers: Sit to/from Stand Sit to Stand: Max assist  General transfer comment: pt stood 1 x EOB with bed height elevated. poor standing posture and poor tolerance. pt weaker today than previously observed ~ 3 days prior. Pt only able to safely get from EOB > recliner via slideboard. Pt continues to require extensive max assist to safely transfer. Author continues to recommend +2 assistance form Engineer, manufacturing or use of mechanical lift.    Ambulation/Gait  General Gait Details: currently unable    Balance Overall balance assessment: Needs assistance Sitting-balance support: No upper extremity supported, Feet supported Sitting balance-Leahy Scale: Good     Standing balance support: Single extremity supported, During functional activity, Reliant on assistive device for balance Standing balance-Leahy Scale: Poor Standing balance comment: pt remains high fall risk    Communication Communication Communication: No apparent difficulties  Cognition Arousal: Alert Behavior During Therapy: WFL for tasks assessed/performed   PT - Cognitive impairments: No apparent impairments      PT - Cognition Comments: Pt is A and O and cooperative throughout Following commands: Intact      Cueing Cueing Techniques: Verbal cues, Tactile cues         Pertinent Vitals/Pain Pain Assessment Pain Assessment: 0-10 Pain Score: 3  Pain Location: R UE Pain Descriptors / Indicators: Headache, Discomfort, Dull Pain Intervention(s): Limited activity within patient's tolerance, Monitored during session, Premedicated before session, Repositioned     PT Goals (current goals can now be found in the care plan section) Acute Rehab PT Goals Patient Stated Goal: rehab then home Progress  towards PT goals: Progressing toward goals    Frequency    7X/week       AM-PAC PT "6 Clicks" Mobility   Outcome Measure  Help needed turning from your back to  your side while in a flat bed without using bedrails?: A Lot Help needed moving from lying on your back to sitting on the side of a flat bed without using bedrails?: A Lot Help needed moving to and from a bed to a chair (including a wheelchair)?: A Lot Help needed standing up from a chair using your arms (e.g., wheelchair or bedside chair)?: A Lot Help needed to walk in hospital room?: Total Help needed climbing 3-5 steps with a railing? : Total 6 Click Score: 10    End of Session   Activity Tolerance: Patient tolerated treatment well Patient left: in chair;with call bell/phone within reach;with chair alarm set Nurse Communication: Mobility status PT Visit Diagnosis: Difficulty in walking, not elsewhere classified (R26.2);Other abnormalities of gait and mobility (R26.89);Repeated falls (R29.6);Muscle weakness (generalized) (M62.81)     Time: 1610-9604 PT Time Calculation (min) (ACUTE ONLY): 30 min  Charges:    $Therapeutic Activity: 23-37 mins PT General Charges $$ ACUTE PT VISIT: 1 Visit                     Jetta Lout PTA 09/12/23, 9:30 AM

## 2023-09-12 NOTE — Progress Notes (Signed)
 PROGRESS NOTE    Kathleen Paul  UUV:[id number] DOB: 05/10/1936 DOA: 09/01/2023 PCP: Doreene Nest, NP    Brief Narrative:   [phone] with h/o anxiety, gastric ulcer, HLD, DVT, HTN, and CAD who presented on 2/22 with a mechanical fall.  She was found to have a R humerus fracture and orthopedics was consulted.  She was also noted to have elevated troponin (25 -> 410 -> 1431, now downtrending) and cardiology was consulted, started heparin.  Developed ABLA with CT showing concern for humerus bleeding and heparin was stopped.  Transfused 3 units PRBC.  Echo without WMA, cardiology recommended conservative management.     Assessment & Plan:   Principal Problem:   Acute blood loss anemia (ABLA) Active Problems:   NSTEMI (non-ST elevated myocardial infarction) (HCC)   Fall   Closed right humeral fracture   AKI (acute kidney injury) (HCC)   Hyponatremia   Essential hypertension   Hyperkalemia   Lactic acidosis   Hypothyroidism   Dyslipidemia   Generalized weakness   GERD without esophagitis   PSVT (paroxysmal supraventricular tachycardia) (HCC) Mechanical fall Patient presented with mechanical fall that resulted in humerus fracture (nonoperative) She has had generalized weakness and will require SNF rehab.  Will continue daily therapy efforts while admitted.  Out of bed is much as possible.   Acute blood loss anemia  Patient received 3 units total of packed red blood cells during the hospital course last being on 2/27 Patient does have bruising on the right arm CT scan showing hematoma right arm Hemoglobin 9.0 on 3/5.  No signs of blood loss.  No indication for transfusion.   Elevated troponin No chest pain  Troponin peaked at 1431 Likely related to demand ischemia Cardiology recommended conservative management Patient on aspirin, metoprolol, simvastatin   Stage 3a CKD Appears stable at this time Avoid nephrotoxic agents   Closed right spiral humeral fracture Orthopedic  surgery recommending conservative management PT recommending hemiwalker and breg sling Taking Os-Cal for presumed associated osteoporosis Will need skilled nursing facility.  Bed search in progress.   Essential hypertension Restart metoprolol 3/5 Continue holding enalapril for now.  Consider restarting at 24 hours   LLL PNA COVID negative CXR with atelectasis vs. LLL infiltrate Continue Ceftriaxone/Azithromycin for now, as she is improving Continue bronchodilator   Hyponatremia Improved   Hypothyroidism Continue Synthroid   Dyslipidemia LDL 65 Continue simvastatin   PSVT (paroxysmal supraventricular tachycardia) Metoprolol tartrate 12.5 twice daily restarted 3//   GERD without esophagitis Continue PPI therapy   DNR Confirmed on admission [facility] documents reviewed    DVT prophylaxis: SCD Code Status: DNR Family Communication: None today Disposition Plan: Status is: Inpatient Remains inpatient appropriate because: CAP with residual respiratory symptoms.  Anticipate discharge readiness in 24 to 48 hours.   Level of care: Telemetry Medical  Consultants:  None  Procedures:  None  Antimicrobials: Rocephin Azithromycin   Subjective: Seen and examined.  Lying in bed.  Main complaint is fatigue.  Also complains of cough, nonproductive.  Objective: Vitals:   09/11/23 2156 09/12/23 0438 09/12/23 0743 09/12/23 0825  BP: (!) 109/57   (!) 152/81  Pulse: 100  86 95  Resp: 18  14 17   Temp: 98 F (36.7 C)   98.1 F (36.7 C)  TempSrc: Oral   Oral  SpO2: 94% 95% 96% 97%  Weight:      Height:        Intake/Output Summary (Last 24 hours) at 09/12/2023 1551 Last data filed  at 09/12/2023 1300 Gross per 24 hour  Intake 360 ml  Output 900 ml  Net -540 ml   Filed Weights   09/01/23 1751  Weight: 62.6 kg    Examination:  General exam: Appears calm and comfortable  Respiratory system: Scattered crackles bilaterally.  Normal work of breathing.  Room  air Cardiovascular system: S1-S2, RRR, no murmurs, no pedal edema Gastrointestinal system: Soft, NT/ND, normal bowel sounds Central nervous system: Alert and oriented. No focal neurological deficits. Extremities: Symmetric 5 x 5 power. Skin: No rashes, lesions or ulcers Psychiatry: Judgement and insight appear normal. Mood & affect appropriate.     Data Reviewed: I have personally reviewed following labs and imaging studies  CBC: Recent Labs  Lab 09/07/23 0836 09/08/23 0621 09/09/23 0557 09/10/23 0557 09/11/23 0537 09/12/23 0353  WBC 4.9  --  5.9 18.7* 15.7* 12.4*  NEUTROABS  --   --  3.7 16.1* 13.5* 10.4*  HGB 10.5* 9.4* 9.6* 10.9* 8.6* 9.0*  HCT 31.0*  --  29.5* 32.3* 25.6* 27.1*  MCV 89.6  --  92.2 89.2 89.5 90.0  PLT 133*  --  142* 163 146* 160   Basic Metabolic Panel: Recent Labs  Lab 09/07/23 0836 09/09/23 0557 09/10/23 0557 09/11/23 0537 09/12/23 0353  NA 134* 135 133* 130* 134*  K 4.6 4.7 4.5 4.5 4.5  CL 102 101 98 100 102  CO2 24 28 25 23 24   GLUCOSE 96 95 105* 97 108*  BUN 28* 38* 40* 50* 42*  CREATININE 0.80 0.98 0.96 1.09* 0.94  CALCIUM 8.5* 8.9 8.7* 8.1* 8.4*   GFR: Estimated Creatinine Clearance: 36.1 mL/min (by C-G formula based on SCr of 0.94 mg/dL). Liver Function Tests: No results for input(s): "AST", "ALT", "ALKPHOS", "BILITOT", "PROT", "ALBUMIN" in the last 168 hours. No results for input(s): "LIPASE", "AMYLASE" in the last 168 hours. No results for input(s): "AMMONIA" in the last 168 hours. Coagulation Profile: No results for input(s): "INR", "PROTIME" in the last 168 hours. Cardiac Enzymes: No results for input(s): "CKTOTAL", "CKMB", "CKMBINDEX", "TROPONINI" in the last 168 hours. BNP (last 3 results) No results for input(s): "PROBNP" in the last 8760 hours. HbA1C: No results for input(s): "HGBA1C" in the last 72 hours. CBG: No results for input(s): "GLUCAP" in the last 168 hours. Lipid Profile: No results for input(s): "CHOL",  "HDL", "LDLCALC", "TRIG", "CHOLHDL", "LDLDIRECT" in the last 72 hours. Thyroid Function Tests: No results for input(s): "TSH", "T4TOTAL", "FREET4", "T3FREE", "THYROIDAB" in the last 72 hours. Anemia Panel: No results for input(s): "VITAMINB12", "FOLATE", "FERRITIN", "TIBC", "IRON", "RETICCTPCT" in the last 72 hours. Sepsis Labs: No results for input(s): "PROCALCITON", "LATICACIDVEN" in the last 168 hours.  Recent Results (from the past 240 hours)  Resp panel by RT-PCR (RSV, Flu A&B, Covid) Anterior Nasal Swab     Status: None   Collection Time: 09/03/23  1:48 PM   Specimen: Anterior Nasal Swab  Result Value Ref Range Status   SARS Coronavirus 2 by RT PCR NEGATIVE NEGATIVE Final    Comment: (NOTE) SARS-CoV-2 target nucleic acids are NOT DETECTED.  The SARS-CoV-2 RNA is generally detectable in upper respiratory specimens during the acute phase of infection. The lowest concentration of SARS-CoV-2 viral copies this assay can detect is 138 copies/mL. A negative result does not preclude SARS-Cov-2 infection and should not be used as the sole basis for treatment or other patient management decisions. A negative result may occur with  improper specimen collection/handling, submission of specimen other than nasopharyngeal swab, presence  of viral mutation(s) within the areas targeted by this assay, and inadequate number of viral copies(<138 copies/mL). A negative result must be combined with clinical observations, patient history, and epidemiological information. The expected result is Negative.  Fact Sheet for Patients:  BloggerCourse.com  Fact Sheet for Healthcare Providers:  SeriousBroker.it  This test is no t yet approved or cleared by the [place] FDA and  has been authorized for detection and/or diagnosis of SARS-CoV-2 by FDA under an Emergency Use Authorization (EUA). This EUA will remain  in effect (meaning this test can be  used) for the duration of the COVID-19 declaration under Section 564(b)(1) of the Act, 21 U.S.C.section 360bbb-3(b)(1), unless the authorization is terminated  or revoked sooner.       Influenza A by PCR NEGATIVE NEGATIVE Final   Influenza B by PCR NEGATIVE NEGATIVE Final    Comment: (NOTE) The Xpert Xpress SARS-CoV-2/FLU/RSV plus assay is intended as an aid in the diagnosis of influenza from Nasopharyngeal swab specimens and should not be used as a sole basis for treatment. Nasal washings and aspirates are unacceptable for Xpert Xpress SARS-CoV-2/FLU/RSV testing.  Fact Sheet for Patients: BloggerCourse.com  Fact Sheet for Healthcare Providers: SeriousBroker.it  This test is not yet approved or cleared by the [place] FDA and has been authorized for detection and/or diagnosis of SARS-CoV-2 by FDA under an Emergency Use Authorization (EUA). This EUA will remain in effect (meaning this test can be used) for the duration of the COVID-19 declaration under Section 564(b)(1) of the Act, 21 U.S.C. section 360bbb-3(b)(1), unless the authorization is terminated or revoked.     Resp Syncytial Virus by PCR NEGATIVE NEGATIVE Final    Comment: (NOTE) Fact Sheet for Patients: BloggerCourse.com  Fact Sheet for Healthcare Providers: SeriousBroker.it  This test is not yet approved or cleared by the [place] FDA and has been authorized for detection and/or diagnosis of SARS-CoV-2 by FDA under an Emergency Use Authorization (EUA). This EUA will remain in effect (meaning this test can be used) for the duration of the COVID-19 declaration under Section 564(b)(1) of the Act, 21 U.S.C. section 360bbb-3(b)(1), unless the authorization is terminated or revoked.  Performed at [facility], [address] [place], [place] [address]   Resp panel by RT-PCR (RSV, Flu A&B, Covid)  Anterior Nasal Swab     Status: None   Collection Time: 09/05/23  5:19 PM   Specimen: Anterior Nasal Swab  Result Value Ref Range Status   SARS Coronavirus 2 by RT PCR NEGATIVE NEGATIVE Final    Comment: (NOTE) SARS-CoV-2 target nucleic acids are NOT DETECTED.  The SARS-CoV-2 RNA is generally detectable in upper respiratory specimens during the acute phase of infection. The lowest concentration of SARS-CoV-2 viral copies this assay can detect is 138 copies/mL. A negative result does not preclude SARS-Cov-2 infection and should not be used as the sole basis for treatment or other patient management decisions. A negative result may occur with  improper specimen collection/handling, submission of specimen other than nasopharyngeal swab, presence of viral mutation(s) within the areas targeted by this assay, and inadequate number of viral copies(<138 copies/mL). A negative result must be combined with clinical observations, patient history, and epidemiological information. The expected result is Negative.  Fact Sheet for Patients:  BloggerCourse.com  Fact Sheet for Healthcare Providers:  SeriousBroker.it  This test is no t yet approved or cleared by the [place] FDA and  has been authorized for detection and/or diagnosis of SARS-CoV-2 by FDA under  an Emergency Use Authorization (EUA). This EUA will remain  in effect (meaning this test can be used) for the duration of the COVID-19 declaration under Section 564(b)(1) of the Act, 21 U.S.C.section 360bbb-3(b)(1), unless the authorization is terminated  or revoked sooner.       Influenza A by PCR NEGATIVE NEGATIVE Final   Influenza B by PCR NEGATIVE NEGATIVE Final    Comment: (NOTE) The Xpert Xpress SARS-CoV-2/FLU/RSV plus assay is intended as an aid in the diagnosis of influenza from Nasopharyngeal swab specimens and should not be used as a sole basis for treatment. Nasal washings  and aspirates are unacceptable for Xpert Xpress SARS-CoV-2/FLU/RSV testing.  Fact Sheet for Patients: BloggerCourse.com  Fact Sheet for Healthcare Providers: SeriousBroker.it  This test is not yet approved or cleared by the [place] FDA and has been authorized for detection and/or diagnosis of SARS-CoV-2 by FDA under an Emergency Use Authorization (EUA). This EUA will remain in effect (meaning this test can be used) for the duration of the COVID-19 declaration under Section 564(b)(1) of the Act, 21 U.S.C. section 360bbb-3(b)(1), unless the authorization is terminated or revoked.     Resp Syncytial Virus by PCR NEGATIVE NEGATIVE Final    Comment: (NOTE) Fact Sheet for Patients: BloggerCourse.com  Fact Sheet for Healthcare Providers: SeriousBroker.it  This test is not yet approved or cleared by the [place] FDA and has been authorized for detection and/or diagnosis of SARS-CoV-2 by FDA under an Emergency Use Authorization (EUA). This EUA will remain in effect (meaning this test can be used) for the duration of the COVID-19 declaration under Section 564(b)(1) of the Act, 21 U.S.C. section 360bbb-3(b)(1), unless the authorization is terminated or revoked.  Performed at [facility], [address] [place], [place] [address]   SARS Coronavirus 2 by RT PCR (hospital order, performed in [facility] hospital lab) *cepheid single result test* Anterior Nasal Swab     Status: None   Collection Time: 09/10/23 12:20 PM   Specimen: Anterior Nasal Swab  Result Value Ref Range Status   SARS Coronavirus 2 by RT PCR NEGATIVE NEGATIVE Final    Comment: (NOTE) SARS-CoV-2 target nucleic acids are NOT DETECTED.  The SARS-CoV-2 RNA is generally detectable in upper and lower respiratory specimens during the acute phase of infection. The lowest concentration of SARS-CoV-2  viral copies this assay can detect is 250 copies / mL. A negative result does not preclude SARS-CoV-2 infection and should not be used as the sole basis for treatment or other patient management decisions.  A negative result may occur with improper specimen collection / handling, submission of specimen other than nasopharyngeal swab, presence of viral mutation(s) within the areas targeted by this assay, and inadequate number of viral copies (<250 copies / mL). A negative result must be combined with clinical observations, patient history, and epidemiological information.  Fact Sheet for Patients:   RoadLapTop.co.za  Fact Sheet for Healthcare Providers: http://kim-miller.com/  This test is not yet approved or  cleared by the [place] FDA and has been authorized for detection and/or diagnosis of SARS-CoV-2 by FDA under an Emergency Use Authorization (EUA).  This EUA will remain in effect (meaning this test can be used) for the duration of the COVID-19 declaration under Section 564(b)(1) of the Act, 21 U.S.C. section 360bbb-3(b)(1), unless the authorization is terminated or revoked sooner.  Performed at [facility], [address], [place], [place] [address]          Radiology Studies: No  results found.      Scheduled Meds:  aspirin EC  81 mg Oral Daily   azithromycin  500 mg Oral Daily   benzonatate  200 mg Oral TID   calcium carbonate  1 tablet Oral BID WC   docusate sodium  100 mg Oral BID   gabapentin  100 mg Oral TID   guaiFENesin  600 mg Oral BID   levothyroxine  88 mcg Oral Q0600   multivitamin with minerals  1 tablet Oral Daily   pantoprazole  40 mg Oral Daily   polyethylene glycol  17 g Oral Daily   simvastatin  40 mg Oral q1800   Continuous Infusions:  cefTRIAXone (ROCEPHIN)  IV 1 g (09/11/23 1607)   promethazine (PHENERGAN) injection (IM or IVPB) Stopped (09/02/23 1107)     LOS: 11 days       Tresa Moore, MD [facility]   If 7PM-7AM, please contact night-coverage  09/12/2023, 3:51 PM

## 2023-09-13 ENCOUNTER — Inpatient Hospital Stay

## 2023-09-13 DIAGNOSIS — D62 Acute posthemorrhagic anemia: Secondary | ICD-10-CM | POA: Diagnosis not present

## 2023-09-13 LAB — CBC WITH DIFFERENTIAL/PLATELET
Abs Immature Granulocytes: 0.51 10*3/uL — ABNORMAL HIGH (ref 0.00–0.07)
Basophils Absolute: 0 10*3/uL (ref 0.0–0.1)
Basophils Relative: 0 %
Eosinophils Absolute: 0.1 10*3/uL (ref 0.0–0.5)
Eosinophils Relative: 1 %
HCT: 27.5 % — ABNORMAL LOW (ref 36.0–46.0)
Hemoglobin: 9.1 g/dL — ABNORMAL LOW (ref 12.0–15.0)
Immature Granulocytes: 5 %
Lymphocytes Relative: 6 %
Lymphs Abs: 0.6 10*3/uL — ABNORMAL LOW (ref 0.7–4.0)
MCH: 30.2 pg (ref 26.0–34.0)
MCHC: 33.1 g/dL (ref 30.0–36.0)
MCV: 91.4 fL (ref 80.0–100.0)
Monocytes Absolute: 1.1 10*3/uL — ABNORMAL HIGH (ref 0.1–1.0)
Monocytes Relative: 11 %
Neutro Abs: 7.5 10*3/uL (ref 1.7–7.7)
Neutrophils Relative %: 77 %
Platelets: 183 10*3/uL (ref 150–400)
RBC: 3.01 MIL/uL — ABNORMAL LOW (ref 3.87–5.11)
RDW: 22.5 % — ABNORMAL HIGH (ref 11.5–15.5)
WBC: 9.8 10*3/uL (ref 4.0–10.5)
nRBC: 0 % (ref 0.0–0.2)

## 2023-09-13 LAB — BASIC METABOLIC PANEL WITH GFR
Anion gap: 12 (ref 5–15)
BUN: 44 mg/dL — ABNORMAL HIGH (ref 8–23)
CO2: 20 mmol/L — ABNORMAL LOW (ref 22–32)
Calcium: 8.2 mg/dL — ABNORMAL LOW (ref 8.9–10.3)
Chloride: 100 mmol/L (ref 98–111)
Creatinine, Ser: 0.87 mg/dL (ref 0.44–1.00)
GFR, Estimated: >60 mL/min
Glucose, Bld: 94 mg/dL (ref 70–99)
Potassium: 4.3 mmol/L (ref 3.5–5.1)
Sodium: 132 mmol/L — ABNORMAL LOW (ref 135–145)

## 2023-09-13 MED ORDER — ACETAMINOPHEN 325 MG PO TABS
650.0000 mg | ORAL_TABLET | ORAL | Status: DC | PRN
  Administered 2023-09-14: 650 mg via ORAL
  Filled 2023-09-13: qty 2

## 2023-09-13 MED ORDER — IPRATROPIUM-ALBUTEROL 0.5-2.5 (3) MG/3ML IN SOLN
3.0000 mL | Freq: Four times a day (QID) | RESPIRATORY_TRACT | Status: DC
  Filled 2023-09-13: qty 3

## 2023-09-13 MED ORDER — METHYLPREDNISOLONE SODIUM SUCC 40 MG IJ SOLR
40.0000 mg | Freq: Every day | INTRAMUSCULAR | Status: DC
  Administered 2023-09-13 – 2023-09-16 (×4): 40 mg via INTRAVENOUS
  Filled 2023-09-13 (×4): qty 1

## 2023-09-13 MED ORDER — ENALAPRIL MALEATE 10 MG PO TABS
20.0000 mg | ORAL_TABLET | Freq: Every day | ORAL | Status: DC
  Administered 2023-09-14 – 2023-09-19 (×6): 20 mg via ORAL
  Filled 2023-09-13: qty 1
  Filled 2023-09-13: qty 2
  Filled 2023-09-13 (×2): qty 1
  Filled 2023-09-13: qty 2
  Filled 2023-09-13 (×2): qty 1
  Filled 2023-09-13 (×4): qty 2

## 2023-09-13 MED ORDER — BUTALBITAL-APAP-CAFFEINE 50-325-40 MG PO TABS
1.0000 | ORAL_TABLET | ORAL | Status: DC | PRN
  Administered 2023-09-13 – 2023-09-17 (×3): 1 via ORAL
  Filled 2023-09-13 (×3): qty 1

## 2023-09-13 MED ORDER — IPRATROPIUM-ALBUTEROL 0.5-2.5 (3) MG/3ML IN SOLN
3.0000 mL | RESPIRATORY_TRACT | Status: DC | PRN
  Administered 2023-09-14: 3 mL via RESPIRATORY_TRACT
  Filled 2023-09-13: qty 3

## 2023-09-13 NOTE — Progress Notes (Signed)
 PT Cancellation Note  Patient Details Name: Kathleen Paul MRN: [id number] DOB: Dec 29, 1935   Cancelled Treatment:     PT attempt. Pt sleeping soundly upon arrival. Daughter at bedside endorses patient having a rough night. Requested letting pt sleep. Will return later this date and continue to follow per current POC.    Rushie Chestnut 09/13/2023, 7:30 AM

## 2023-09-13 NOTE — Progress Notes (Signed)
 OT Cancellation Note  Patient Details Name: Kathleen Paul MRN: [id number] DOB: 08/09/1935   Cancelled Treatment:    Reason Eval/Treat Not Completed: Patient at procedure or test/ unavailable. Pt out of the room. Will re-attempt at later date/time.   Arman Filter., MPH, MS, OTR/L ascom [phone] 09/13/23, 2:00 PM

## 2023-09-13 NOTE — Plan of Care (Signed)
  Problem: Activity: Goal: Ability to tolerate increased activity will improve Outcome: Progressing   Problem: Education: Goal: Knowledge of General Education information will improve Description: Including pain rating scale, medication(s)/side effects and non-pharmacologic comfort measures Outcome: Progressing   Problem: Health Behavior/Discharge Planning: Goal: Ability to manage health-related needs will improve Outcome: Progressing   Problem: Clinical Measurements: Goal: Diagnostic test results will improve Outcome: Progressing Goal: Respiratory complications will improve Outcome: Progressing   Problem: Activity: Goal: Risk for activity intolerance will decrease Outcome: Progressing   Problem: Nutrition: Goal: Adequate nutrition will be maintained Outcome: Progressing   Problem: Pain Managment: Goal: General experience of comfort will improve and/or be controlled Outcome: Progressing

## 2023-09-13 NOTE — TOC Progression Note (Signed)
 Transition of Care [facility]) - Progression Note    Patient Details  Name: Kathleen Paul MRN: [id number] Date of Birth: 1935-07-11  [facility] CM/SW Contact  [name], RN Phone Number: 09/13/2023, 9:09 AM  Clinical Narrative:    Sherron Monday with [name] the patient's daughter and provided the facilities in [place] that have made a bed offer, she stated that she will be in the room today and will review these I will speak with her again soon to get choice        Expected Discharge Plan and Services                                               Social Determinants of Health (SDOH) Interventions SDOH Screenings   Food Insecurity: No Food Insecurity (09/02/2023)  Housing: Low Risk  (09/02/2023)  Transportation Needs: No Transportation Needs (09/02/2023)  Utilities: Not At Risk (09/02/2023)  Alcohol Screen: Low Risk  (12/18/2022)  Depression (PHQ2-9): Low Risk  (12/18/2022)  Financial Resource Strain: Low Risk  (12/18/2022)  Physical Activity: Inactive (12/18/2022)  Social Connections: Socially Isolated (09/02/2023)  Stress: No Stress Concern Present (12/18/2022)  Tobacco Use: Medium Risk (09/01/2023)    Readmission Risk Interventions     No data to display

## 2023-09-13 NOTE — Progress Notes (Signed)
 Physical Therapy Treatment Patient Details Name: Kathleen Paul MRN: [id number] DOB: 1935-12-30 Today's Date: 09/13/2023   History of Present Illness Kathleen Paul is an 87yoF who comes to [facility] on 2/[facility] after fall at home. Noted elevation of troponin as well. Foundtohave Acute mildly displaced fracture of the mid right humeral diaphysis; managed non surgically. 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    PT Comments  Author returned as requested to assist pt with getting OOB. Noticeably more productive cough today versus previous few. Pt was eager to get OOB to recliner. Supportive daughter/caregiver present and voiced a lot of concerns. Pt was able to exit L side of bed with increased time and heavy use of bed functions/bed rail. Pt performed slide board transfer from EOB to recliner. Continues to require extensive assistance however pt does put forth great effort and did require slightly less assist today versus previous date.  Author will return later this date to assist pt with returning to bed as requested. Dc recs remain appropriate to maximize her independence and safety with all ADLs.  Pt will continue to benefit for skilled PT at DC to maximize her safety and independence.    If plan is discharge home, recommend the following: A lot of help with walking and/or transfers;A lot of help with bathing/dressing/bathroom;Assist for transportation;Help with stairs or ramp for entrance     Equipment Recommendations  Other (comment) (defer to next level of care)       Precautions / Restrictions Precautions Precautions: Fall;Shoulder Shoulder Interventions: Shoulder sling/immobilizer Recall of Precautions/Restrictions: Intact Required Braces or Orthoses: Splint/Cast;Sling;Other Brace Splint/Cast: RUE splint in 90 degrees. Noteable R hand swelling Restrictions Weight Bearing Restrictions Per Provider  Order: Yes RUE Weight Bearing Per Provider Order: Non weight bearing     Mobility  Bed Mobility Overal bed mobility: Needs Assistance Bed Mobility: Supine to Sit  Supine to sit: Mod assist, HOB elevated, Used rails  General bed mobility comments: increased time required. CVcs for sequencing   Transfers Overall transfer level: Needs assistance Equipment used: Sliding board Transfers: Bed to chair/wheelchair/BSC   Lateral/Scoot Transfers: Mod assist, Max assist, With slide board, From elevated surface General transfer comment: Pt required assistance to safely transfer via slideboard form EOB to recliner. INcreased time. several rest due to SOB/coughing spells    Ambulation/Gait  Gait velocity: decreased  General Gait Details: Currently unable. pt is RW dependent at baeline nd currently unable to use RUE.   Balance Overall balance assessment: Needs assistance Sitting-balance support: No upper extremity supported, Feet supported Sitting balance-Leahy Scale: Good     Standing balance support: Single extremity supported, During functional activity, Reliant on assistive device for balance Standing balance-Leahy Scale: Poor Standing balance comment: pt remains high fall risk.       Communication Communication Communication: No apparent difficulties  Cognition Arousal: Alert Behavior During Therapy: WFL for tasks assessed/performed   PT - Cognitive impairments: No apparent impairments    PT - Cognition Comments: Pt is A and O and cooperative throughout Following commands: Intact      Cueing Cueing Techniques: Verbal cues, Tactile cues         Pertinent Vitals/Pain Pain Assessment Pain Assessment: 0-10 Pain Score: 3  Faces Pain Scale: Hurts a little bit Pain Location: R UE Pain Descriptors / Indicators: Headache, Discomfort, Dull Pain Intervention(s): Limited activity within patient's tolerance, Monitored during session, Premedicated before session, Repositioned,  Ice  applied     PT Goals (current goals can now be found in the care plan section) Acute Rehab PT Goals Patient Stated Goal: rehab then home Progress towards PT goals: Progressing toward goals    Frequency    7X/week       AM-PAC PT "6 Clicks" Mobility   Outcome Measure  Help needed turning from your back to your side while in a flat bed without using bedrails?: A Little Help needed moving from lying on your back to sitting on the side of a flat bed without using bedrails?: A Lot Help needed moving to and from a bed to a chair (including a wheelchair)?: A Lot Help needed standing up from a chair using your arms (e.g., wheelchair or bedside chair)?: A Lot Help needed to walk in hospital room?: Total Help needed climbing 3-5 steps with a railing? : Total 6 Click Score: 11    End of Session   Activity Tolerance: Patient tolerated treatment well Patient left: in chair;with call bell/phone within reach;with chair alarm set Nurse Communication: Mobility status PT Visit Diagnosis: Difficulty in walking, not elsewhere classified (R26.2);Other abnormalities of gait and mobility (R26.89);Repeated falls (R29.6);Muscle weakness (generalized) (M62.81)     Time: 1610-9604 PT Time Calculation (min) (ACUTE ONLY): 23 min  Charges:    $Therapeutic Activity: 23-37 mins PT General Charges $$ ACUTE PT VISIT: 1 Visit                    Jetta Lout PTA 09/13/23, 9:02 AM

## 2023-09-13 NOTE — Progress Notes (Signed)
 PROGRESS NOTE    Kathleen Paul  WGN:[id number] DOB: 1936/06/16 DOA: 09/01/2023 PCP: Doreene Nest, NP    Brief Narrative:   [phone] with h/o anxiety, gastric ulcer, HLD, DVT, HTN, and CAD who presented on 2/22 with a mechanical fall.  She was found to have a R humerus fracture and orthopedics was consulted.  She was also noted to have elevated troponin (25 -> 410 -> 1431, now downtrending) and cardiology was consulted, started heparin.  Developed ABLA with CT showing concern for humerus bleeding and heparin was stopped.  Transfused 3 units PRBC.  Echo without WMA, cardiology recommended conservative management.     Assessment & Plan:   Principal Problem:   Acute blood loss anemia (ABLA) Active Problems:   NSTEMI (non-ST elevated myocardial infarction) (HCC)   Fall   Closed right humeral fracture   AKI (acute kidney injury) (HCC)   Hyponatremia   Essential hypertension   Hyperkalemia   Lactic acidosis   Hypothyroidism   Dyslipidemia   Generalized weakness   GERD without esophagitis   PSVT (paroxysmal supraventricular tachycardia) (HCC) Mechanical fall Patient presented with mechanical fall that resulted in humerus fracture (nonoperative) She has had generalized weakness and will require SNF rehab.  Will continue daily therapy efforts while admitted.  Out of bed is much as possible.  SNF bed search in progress   Acute blood loss anemia  Patient received 3 units total of packed red blood cells during the hospital course last being on 2/27 Patient does have bruising on the right arm CT scan showing hematoma right arm Hemoglobin 9.0 on 3/5.  No signs of blood loss.  No indication for transfusion.  Hemoglobin remained stable   Elevated troponin No chest pain  Troponin peaked at 1431 Likely related to demand ischemia Cardiology recommended conservative management Patient on aspirin, metoprolol, simvastatin   Stage 3a CKD Appears stable at this time Avoid nephrotoxic  agents   Closed right spiral humeral fracture Orthopedic surgery recommending conservative management PT recommending hemiwalker and breg sling Taking Os-Cal for presumed associated osteoporosis Will need skilled nursing facility.  Bed search in progress. Continue therapy efforts until placed   Essential hypertension Restart metoprolol 3/5 Restart enalapril 3/6   LLL PNA COVID negative CXR with atelectasis vs. LLL infiltrate Continue Ceftriaxone/Azithromycin for now, as she is improving Continue bronchodilator Antitussives mucolytic's   Hyponatremia Improved   Hypothyroidism Continue Synthroid   Dyslipidemia LDL 65 Continue simvastatin   PSVT (paroxysmal supraventricular tachycardia) Metoprolol tartrate 12.5 twice daily restarted 3/5   GERD without esophagitis Continue PPI therapy  Headache Described on top of the head.  Pounding in nature.  Concerning for migraine.  Trial Fioricet.   DNR Confirmed on admission [facility] documents reviewed    DVT prophylaxis: SCD Code Status: DNR Family Communication: Daughter at bedside 3/6 Disposition Plan: Status is: Inpatient Remains inpatient appropriate because: CAP with residual respiratory symptoms.  Anticipate discharge readiness in 24 to 48 hours.   Level of care: Telemetry Medical  Consultants:  None  Procedures:  None  Antimicrobials: Rocephin Azithromycin   Subjective: Seen and examined.  Sitting up in chair.  Reports "bad night" Main complaint is cough and headache  Objective: Vitals:   09/12/23 2100 09/12/23 2106 09/13/23 0741 09/13/23 1030  BP: (!) 141/65 (!) 141/65 (!) 182/86 (!) 147/100  Pulse: 99 99 88 72  Resp: 18  18   Temp: 98.4 F (36.9 C)  98.4 F (36.9 C)   TempSrc: Oral  SpO2: 97%  97%   Weight:      Height:        Intake/Output Summary (Last 24 hours) at 09/13/2023 1502 Last data filed at 09/13/2023 0600 Gross per 24 hour  Intake 100 ml  Output 900 ml  Net -800 ml   Filed  Weights   09/01/23 1751  Weight: 62.6 kg    Examination:  General exam: NAD Respiratory system: Mild bibasilar crackles.  Normal work of breathing.  Room air Cardiovascular system: S1-S2, RRR, no murmurs, no pedal edema Gastrointestinal system: Soft, NT/ND, normal bowel sounds Central nervous system: Alert and oriented. No focal neurological deficits. Extremities: Symmetric 5 x 5 power. Skin: No rashes, lesions or ulcers Psychiatry: Judgement and insight appear normal. Mood & affect appropriate.     Data Reviewed: I have personally reviewed following labs and imaging studies  CBC: Recent Labs  Lab 09/09/23 0557 09/10/23 0557 09/11/23 0537 09/12/23 0353 09/13/23 1415  WBC 5.9 18.7* 15.7* 12.4* 9.8  NEUTROABS 3.7 16.1* 13.5* 10.4* PENDING  HGB 9.6* 10.9* 8.6* 9.0* 9.1*  HCT 29.5* 32.3* 25.6* 27.1* 27.5*  MCV 92.2 89.2 89.5 90.0 91.4  PLT 142* 163 146* 160 183   Basic Metabolic Panel: Recent Labs  Lab 09/09/23 0557 09/10/23 0557 09/11/23 0537 09/12/23 0353 09/13/23 1415  NA 135 133* 130* 134* 132*  K 4.7 4.5 4.5 4.5 4.3  CL 101 98 100 102 100  CO2 28 25 23 24  20*  GLUCOSE 95 105* 97 108* 94  BUN 38* 40* 50* 42* 44*  CREATININE 0.98 0.96 1.09* 0.94 0.87  CALCIUM 8.9 8.7* 8.1* 8.4* 8.2*   GFR: Estimated Creatinine Clearance: 39 mL/min (by C-G formula based on SCr of 0.87 mg/dL). Liver Function Tests: No results for input(s): "AST", "ALT", "ALKPHOS", "BILITOT", "PROT", "ALBUMIN" in the last 168 hours. No results for input(s): "LIPASE", "AMYLASE" in the last 168 hours. No results for input(s): "AMMONIA" in the last 168 hours. Coagulation Profile: No results for input(s): "INR", "PROTIME" in the last 168 hours. Cardiac Enzymes: No results for input(s): "CKTOTAL", "CKMB", "CKMBINDEX", "TROPONINI" in the last 168 hours. BNP (last 3 results) No results for input(s): "PROBNP" in the last 8760 hours. HbA1C: No results for input(s): "HGBA1C" in the last 72  hours. CBG: No results for input(s): "GLUCAP" in the last 168 hours. Lipid Profile: No results for input(s): "CHOL", "HDL", "LDLCALC", "TRIG", "CHOLHDL", "LDLDIRECT" in the last 72 hours. Thyroid Function Tests: No results for input(s): "TSH", "T4TOTAL", "FREET4", "T3FREE", "THYROIDAB" in the last 72 hours. Anemia Panel: No results for input(s): "VITAMINB12", "FOLATE", "FERRITIN", "TIBC", "IRON", "RETICCTPCT" in the last 72 hours. Sepsis Labs: No results for input(s): "PROCALCITON", "LATICACIDVEN" in the last 168 hours.  Recent Results (from the past 240 hours)  Resp panel by RT-PCR (RSV, Flu A&B, Covid) Anterior Nasal Swab     Status: None   Collection Time: 09/05/23  5:19 PM   Specimen: Anterior Nasal Swab  Result Value Ref Range Status   SARS Coronavirus 2 by RT PCR NEGATIVE NEGATIVE Final    Comment: (NOTE) SARS-CoV-2 target nucleic acids are NOT DETECTED.  The SARS-CoV-2 RNA is generally detectable in upper respiratory specimens during the acute phase of infection. The lowest concentration of SARS-CoV-2 viral copies this assay can detect is 138 copies/mL. A negative result does not preclude SARS-Cov-2 infection and should not be used as the sole basis for treatment or other patient management decisions. A negative result may occur with  improper specimen collection/handling, submission  of specimen other than nasopharyngeal swab, presence of viral mutation(s) within the areas targeted by this assay, and inadequate number of viral copies(<138 copies/mL). A negative result must be combined with clinical observations, patient history, and epidemiological information. The expected result is Negative.  Fact Sheet for Patients:  BloggerCourse.com  Fact Sheet for Healthcare Providers:  SeriousBroker.it  This test is no t yet approved or cleared by the [place] FDA and  has been authorized for detection and/or diagnosis of  SARS-CoV-2 by FDA under an Emergency Use Authorization (EUA). This EUA will remain  in effect (meaning this test can be used) for the duration of the COVID-19 declaration under Section 564(b)(1) of the Act, 21 U.S.C.section 360bbb-3(b)(1), unless the authorization is terminated  or revoked sooner.       Influenza A by PCR NEGATIVE NEGATIVE Final   Influenza B by PCR NEGATIVE NEGATIVE Final    Comment: (NOTE) The Xpert Xpress SARS-CoV-2/FLU/RSV plus assay is intended as an aid in the diagnosis of influenza from Nasopharyngeal swab specimens and should not be used as a sole basis for treatment. Nasal washings and aspirates are unacceptable for Xpert Xpress SARS-CoV-2/FLU/RSV testing.  Fact Sheet for Patients: BloggerCourse.com  Fact Sheet for Healthcare Providers: SeriousBroker.it  This test is not yet approved or cleared by the [place] FDA and has been authorized for detection and/or diagnosis of SARS-CoV-2 by FDA under an Emergency Use Authorization (EUA). This EUA will remain in effect (meaning this test can be used) for the duration of the COVID-19 declaration under Section 564(b)(1) of the Act, 21 U.S.C. section 360bbb-3(b)(1), unless the authorization is terminated or revoked.     Resp Syncytial Virus by PCR NEGATIVE NEGATIVE Final    Comment: (NOTE) Fact Sheet for Patients: BloggerCourse.com  Fact Sheet for Healthcare Providers: SeriousBroker.it  This test is not yet approved or cleared by the [place] FDA and has been authorized for detection and/or diagnosis of SARS-CoV-2 by FDA under an Emergency Use Authorization (EUA). This EUA will remain in effect (meaning this test can be used) for the duration of the COVID-19 declaration under Section 564(b)(1) of the Act, 21 U.S.C. section 360bbb-3(b)(1), unless the authorization is terminated  or revoked.  Performed at [facility], [address] [place], [place] [address]   SARS Coronavirus 2 by RT PCR (hospital order, performed in [facility] hospital lab) *cepheid single result test* Anterior Nasal Swab     Status: None   Collection Time: 09/10/23 12:20 PM   Specimen: Anterior Nasal Swab  Result Value Ref Range Status   SARS Coronavirus 2 by RT PCR NEGATIVE NEGATIVE Final    Comment: (NOTE) SARS-CoV-2 target nucleic acids are NOT DETECTED.  The SARS-CoV-2 RNA is generally detectable in upper and lower respiratory specimens during the acute phase of infection. The lowest concentration of SARS-CoV-2 viral copies this assay can detect is 250 copies / mL. A negative result does not preclude SARS-CoV-2 infection and should not be used as the sole basis for treatment or other patient management decisions.  A negative result may occur with improper specimen collection / handling, submission of specimen other than nasopharyngeal swab, presence of viral mutation(s) within the areas targeted by this assay, and inadequate number of viral copies (<250 copies / mL). A negative result must be combined with clinical observations, patient history, and epidemiological information.  Fact Sheet for Patients:   RoadLapTop.co.za  Fact Sheet for Healthcare Providers: http://kim-miller.com/  This test is not yet approved or  cleared by the [place] and has been authorized for detection and/or diagnosis of SARS-CoV-2 by FDA under an Emergency Use Authorization (EUA).  This EUA will remain in effect (meaning this test can be used) for the duration of the COVID-19 declaration under Section 564(b)(1) of the Act, 21 U.S.C. section 360bbb-3(b)(1), unless the authorization is terminated or revoked sooner.  Performed at [facility], [address], [place], [place] [address]          Radiology Studies: No  results found.      Scheduled Meds:  aspirin EC  81 mg Oral Daily   azithromycin  500 mg Oral Daily   benzonatate  200 mg Oral TID   calcium carbonate  1 tablet Oral BID WC   docusate sodium  100 mg Oral BID   enalapril  20 mg Oral Daily   gabapentin  100 mg Oral TID   guaiFENesin  600 mg Oral BID   levothyroxine  88 mcg Oral Q0600   methylPREDNISolone (SOLU-MEDROL) injection  40 mg Intravenous Daily   metoprolol tartrate  12.5 mg Oral BID   multivitamin with minerals  1 tablet Oral Daily   pantoprazole  40 mg Oral Daily   polyethylene glycol  17 g Oral Daily   simvastatin  40 mg Oral q1800   Continuous Infusions:  cefTRIAXone (ROCEPHIN)  IV 1 g (09/12/23 1704)   promethazine (PHENERGAN) injection (IM or IVPB) Stopped (09/02/23 1107)     LOS: 12 days      Tresa Moore, MD [facility]   If 7PM-7AM, please contact night-coverage  09/13/2023, 3:02 PM

## 2023-09-14 DIAGNOSIS — D62 Acute posthemorrhagic anemia: Secondary | ICD-10-CM | POA: Diagnosis not present

## 2023-09-14 MED ORDER — IPRATROPIUM-ALBUTEROL 0.5-2.5 (3) MG/3ML IN SOLN: 3.0000 mL | Freq: Four times a day (QID) | RESPIRATORY_TRACT | Status: DC

## 2023-09-14 MED ORDER — CLONAZEPAM 0.5 MG PO TABS
0.5000 mg | ORAL_TABLET | Freq: Two times a day (BID) | ORAL | Status: DC | PRN
Start: 2023-09-14 — End: 2023-09-19
  Administered 2023-09-14 – 2023-09-19 (×5): 0.5 mg via ORAL
  Filled 2023-09-14 (×5): qty 1

## 2023-09-14 MED ORDER — IPRATROPIUM-ALBUTEROL 0.5-2.5 (3) MG/3ML IN SOLN
3.0000 mL | RESPIRATORY_TRACT | Status: DC | PRN
  Administered 2023-09-14 – 2023-09-15 (×2): 3 mL via RESPIRATORY_TRACT
  Filled 2023-09-14 (×2): qty 3

## 2023-09-14 NOTE — TOC Progression Note (Signed)
 Transition of Care [facility]) - Progression Note    Patient Details  Name: Kathleen Paul MRN: [id number] Date of Birth: May 21, 1936  Transition of [facility]) CM/SW Contact  [name], RN Phone Number: 09/14/2023, 8:43 AM  Clinical Narrative:    [name] the patient's daughter and requested the bed choice, they chose [facility] or [facility] I called [facility] admissions and accepted the bed offer I called [facility] and requested Ins approval for [facility]       Expected Discharge Plan and Services                                               Social Determinants of Health (SDOH) Interventions SDOH Screenings   Food Insecurity: No Food Insecurity (09/02/2023)  Housing: Low Risk  (09/02/2023)  Transportation Needs: No Transportation Needs (09/02/2023)  Utilities: Not At Risk (09/02/2023)  Alcohol Screen: Low Risk  (12/18/2022)  Depression (PHQ2-9): Low Risk  (12/18/2022)  Financial Resource Strain: Low Risk  (12/18/2022)  Physical Activity: Inactive (12/18/2022)  Social Connections: Socially Isolated (09/02/2023)  Stress: No Stress Concern Present (12/18/2022)  Tobacco Use: Medium Risk (09/01/2023)    Readmission Risk Interventions     No data to display

## 2023-09-14 NOTE — Progress Notes (Signed)
 Physical Therapy Treatment Patient Details Name: Kathleen Paul MRN: [id number] DOB: 1936/05/28 Today's Date: 09/14/2023   History of Present Illness Kathleen Paul is an 87yoF who [facility] after fall at home. Noted elevation of troponin as well. Foundtohave Acute mildly displaced fracture of the mid right humeral diaphysis; managed non surgically. 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    PT Comments  Pt was long sitting in bed, A and O x 4, and eager to get OOB. Pt states she feels better/stronger this morning and really wanted to try standing transfers again. PT has poor standing abilities. Max-total assist to safely stand and pivot to recliner from elevated bed height. Author highly recommends mechanical lift transfers or slideboard transfers only. Pt did stand a total of three times but unable to fully stand erect and unable to progress/lift either LE in standing. Pt remains far from her baseline abilities. Highly recommend continued skilled PT at DC to maximize her independence and safety with all ADLs.     If plan is discharge home, recommend the following: A lot of help with walking and/or transfers;A lot of help with bathing/dressing/bathroom;Assist for transportation;Help with stairs or ramp for entrance     Equipment Recommendations  Other (comment) (Defer to next level of care)       Precautions / Restrictions Precautions Precautions: Fall;Shoulder Shoulder Interventions: Shoulder sling/immobilizer Recall of Precautions/Restrictions: Intact Required Braces or Orthoses: Splint/Cast;Sling;Other Brace Splint/Cast: RUE splint in 90 degrees. Noteable R hand swelling Splint/Cast - Date Prophylactic Dressing Applied (if applicable): 09/01/23 Restrictions Weight Bearing Restrictions Per Provider Order: Yes RUE Weight Bearing Per Provider Order: Non weight bearing     Mobility   Bed Mobility Overal bed mobility: Needs Assistance Bed Mobility: Supine to Sit  Supine to sit: Mod assist, HOB elevated, Used rails  General bed mobility comments: pt continues to required extenisve time and assistance to achieve EOB sitting.remains limited by NWB restrictions/weakness    Transfers Overall transfer level: Needs assistance Equipment used: Hemi-walker Transfers: Sit to/from Stand, Bed to chair/wheelchair/BSC Sit to Stand: Max assist, Total assist, From elevated surface Stand pivot transfers: Total assist  General transfer comment: Pt wanted to try to focus on standing however author recommend s pt fosuc on slideboard transfers. she did stand EOB with max assist from elevated bed height. poor standing posture and standing tolerance overall. pt fatigues quickly. needs BLE feet blocked to prevent sliding. Author does not recommend pt perform standing transfers at6 this time. will return later to assist pt with returning to bed via slideboard      Balance Overall balance assessment: Needs assistance Sitting-balance support: No upper extremity supported, Feet supported Sitting balance-Leahy Scale: Good     Standing balance support: During functional activity, Reliant on assistive device for balance Standing balance-Leahy Scale: Zero Standing balance comment: pt needs extensive assistance to stand. unable to stand without max-total assist       Communication Communication Communication: No apparent difficulties  Cognition Arousal: Alert Behavior During Therapy: WFL for tasks assessed/performed   PT - Cognitive impairments: No apparent impairments      PT - Cognition Comments: Pt is A and O and cooperative throughout. does lack insight of her current abilities. Following commands: Intact      Cueing Cueing Techniques: Verbal cues, Tactile cues     General Comments General comments (skin integrity, edema, etc.): reviewed importance of  performing LE ther ex to promote  strengthening of BLEs      Pertinent Vitals/Pain Pain Assessment Pain Assessment: No/denies pain     PT Goals (current goals can now be found in the care plan section) Acute Rehab PT Goals Patient Stated Goal: rehab then home Progress towards PT goals: Not progressing toward goals - comment    Frequency    7X/week       AM-PAC PT "6 Clicks" Mobility   Outcome Measure  Help needed turning from your back to your side while in a flat bed without using bedrails?: A Little Help needed moving from lying on your back to sitting on the side of a flat bed without using bedrails?: A Lot Help needed moving to and from a bed to a chair (including a wheelchair)?: A Lot Help needed standing up from a chair using your arms (e.g., wheelchair or bedside chair)?: A Lot Help needed to walk in hospital room?: Total Help needed climbing 3-5 steps with a railing? : Total 6 Click Score: 11    End of Session   Activity Tolerance: Patient limited by fatigue Patient left: in chair;with call bell/phone within reach;with chair alarm set Nurse Communication: Mobility status PT Visit Diagnosis: Difficulty in walking, not elsewhere classified (R26.2);Other abnormalities of gait and mobility (R26.89);Repeated falls (R29.6);Muscle weakness (generalized) (M62.81)     Time: 1096-0454 PT Time Calculation (min) (ACUTE ONLY): 28 min  Charges:    $Therapeutic Activity: 23-37 mins PT General Charges $$ ACUTE PT VISIT: 1 Visit                     Jetta Lout PTA 09/14/23, 9:35 AM

## 2023-09-14 NOTE — Progress Notes (Signed)
 PROGRESS NOTE    Kathleen Paul  WUJ:[id number] DOB: March 25, 1936 DOA: 09/01/2023 PCP: Doreene Nest, NP    Brief Narrative:   [phone] with h/o anxiety, gastric ulcer, HLD, DVT, HTN, and CAD who presented on 2/22 with a mechanical fall.  She was found to have a R humerus fracture and orthopedics was consulted.  She was also noted to have elevated troponin (25 -> 410 -> 1431, now downtrending) and cardiology was consulted, started heparin.  Developed ABLA with CT showing concern for humerus bleeding and heparin was stopped.  Transfused 3 units PRBC.  Echo without WMA, cardiology recommended conservative management.     Assessment & Plan:   Principal Problem:   Acute blood loss anemia (ABLA) Active Problems:   NSTEMI (non-ST elevated myocardial infarction) (HCC)   Fall   Closed right humeral fracture   AKI (acute kidney injury) (HCC)   Hyponatremia   Essential hypertension   Hyperkalemia   Lactic acidosis   Hypothyroidism   Dyslipidemia   Generalized weakness   GERD without esophagitis   PSVT (paroxysmal supraventricular tachycardia) (HCC) Mechanical fall Patient presented with mechanical fall that resulted in humerus fracture (nonoperative) She has had generalized weakness and will require SNF rehab.  Will continue daily therapy efforts while admitted.  Out of bed is much as possible.  Have excepted skilled nursing facility bed.  Insurance authorization pending.   Acute blood loss anemia  Patient received 3 units total of packed red blood cells during the hospital course last being on 2/27 Patient does have bruising on the right arm CT scan showing hematoma right arm Hemoglobin 9.0 on 3/5.  No signs of blood loss.  No indication for transfusion.  Hemoglobin remained stable   Elevated troponin No chest pain  Troponin peaked at 1431 Likely related to demand ischemia Cardiology recommended conservative management Patient on aspirin, metoprolol, simvastatin   Stage 3a  CKD Appears stable at this time Avoid nephrotoxic agents   Closed right spiral humeral fracture Orthopedic surgery recommending conservative management PT recommending hemiwalker and breg sling Taking Os-Cal for presumed associated osteoporosis Will need skilled nursing facility.  Bed search in progress. Continue therapy efforts until placed   Essential hypertension Restart metoprolol 3/5 Restart enalapril 3/6   LLL PNA COVID negative CXR with atelectasis vs. LLL infiltrate Rocephin and azithromycin.  Course completed Continue bronchodilator Antitussives mucolytic's   Hyponatremia Improved   Hypothyroidism Continue Synthroid   Dyslipidemia LDL 65 Continue simvastatin   PSVT (paroxysmal supraventricular tachycardia) Metoprolol tartrate 12.5 twice daily restarted 3/5   GERD without esophagitis Continue PPI therapy  Headache Described on top of the head.  Pounding in nature.  Concerning for migraine.  Trial Fioricet.   DNR Confirmed on admission [facility] documents reviewed    DVT prophylaxis: SCD Code Status: DNR Family Communication: Daughter at bedside 3/6, 3/7 Disposition Plan: Status is: Inpatient Remains inpatient appropriate because: CAP with residual respiratory symptoms.  Patient is essentially medically ready for discharge.  Pending insurance authorization for discharge to skilled nursing facility.   Level of care: Telemetry Medical  Consultants:  None  Procedures:  None  Antimicrobials: Rocephin Azithromycin   Subjective: Seen and examined.  Sitting up in chair.  Daughter-in-law at bedside.  Clinically improved.  Objective: Vitals:   09/13/23 1603 09/13/23 2109 09/13/23 2120 09/14/23 0725  BP: (!) 152/80 118/65 118/65 131/64  Pulse: 71 92 92 85  Resp: 17 18  16   Temp: 97.6 F (36.4 C) 98.2 F (36.8 C)  98.3 F (36.8 C)  TempSrc:    Oral  SpO2: 98% 96%  98%  Weight:      Height:        Intake/Output Summary (Last 24 hours) at  09/14/2023 1448 Last data filed at 09/14/2023 1128 Gross per 24 hour  Intake 100 ml  Output 1450 ml  Net -1350 ml   Filed Weights   09/01/23 1751  Weight: 62.6 kg    Examination:  General exam: No acute distress Respiratory system: Mild scattered crackles.  Normal work of breathing.  Room air Cardiovascular system: S1-S2, RRR, no murmurs, no pedal edema Gastrointestinal system: Soft, NT/ND, normal bowel sounds Central nervous system: Alert and oriented. No focal neurological deficits. Extremities: Symmetric 5 x 5 power. Skin: No rashes, lesions or ulcers Psychiatry: Judgement and insight appear normal. Mood & affect appropriate.     Data Reviewed: I have personally reviewed following labs and imaging studies  CBC: Recent Labs  Lab 09/09/23 0557 09/10/23 0557 09/11/23 0537 09/12/23 0353 09/13/23 1415  WBC 5.9 18.7* 15.7* 12.4* 9.8  NEUTROABS 3.7 16.1* 13.5* 10.4* 7.5  HGB 9.6* 10.9* 8.6* 9.0* 9.1*  HCT 29.5* 32.3* 25.6* 27.1* 27.5*  MCV 92.2 89.2 89.5 90.0 91.4  PLT 142* 163 146* 160 183   Basic Metabolic Panel: Recent Labs  Lab 09/09/23 0557 09/10/23 0557 09/11/23 0537 09/12/23 0353 09/13/23 1415  NA 135 133* 130* 134* 132*  K 4.7 4.5 4.5 4.5 4.3  CL 101 98 100 102 100  CO2 28 25 23 24  20*  GLUCOSE 95 105* 97 108* 94  BUN 38* 40* 50* 42* 44*  CREATININE 0.98 0.96 1.09* 0.94 0.87  CALCIUM 8.9 8.7* 8.1* 8.4* 8.2*   GFR: Estimated Creatinine Clearance: 39 mL/min (by C-G formula based on SCr of 0.87 mg/dL). Liver Function Tests: No results for input(s): "AST", "ALT", "ALKPHOS", "BILITOT", "PROT", "ALBUMIN" in the last 168 hours. No results for input(s): "LIPASE", "AMYLASE" in the last 168 hours. No results for input(s): "AMMONIA" in the last 168 hours. Coagulation Profile: No results for input(s): "INR", "PROTIME" in the last 168 hours. Cardiac Enzymes: No results for input(s): "CKTOTAL", "CKMB", "CKMBINDEX", "TROPONINI" in the last 168 hours. BNP (last 3  results) No results for input(s): "PROBNP" in the last 8760 hours. HbA1C: No results for input(s): "HGBA1C" in the last 72 hours. CBG: No results for input(s): "GLUCAP" in the last 168 hours. Lipid Profile: No results for input(s): "CHOL", "HDL", "LDLCALC", "TRIG", "CHOLHDL", "LDLDIRECT" in the last 72 hours. Thyroid Function Tests: No results for input(s): "TSH", "T4TOTAL", "FREET4", "T3FREE", "THYROIDAB" in the last 72 hours. Anemia Panel: No results for input(s): "VITAMINB12", "FOLATE", "FERRITIN", "TIBC", "IRON", "RETICCTPCT" in the last 72 hours. Sepsis Labs: No results for input(s): "PROCALCITON", "LATICACIDVEN" in the last 168 hours.  Recent Results (from the past 240 hours)  Resp panel by RT-PCR (RSV, Flu A&B, Covid) Anterior Nasal Swab     Status: None   Collection Time: 09/05/23  5:19 PM   Specimen: Anterior Nasal Swab  Result Value Ref Range Status   SARS Coronavirus 2 by RT PCR NEGATIVE NEGATIVE Final    Comment: (NOTE) SARS-CoV-2 target nucleic acids are NOT DETECTED.  The SARS-CoV-2 RNA is generally detectable in upper respiratory specimens during the acute phase of infection. The lowest concentration of SARS-CoV-2 viral copies this assay can detect is 138 copies/mL. A negative result does not preclude SARS-Cov-2 infection and should not be used as the sole basis for treatment or other patient  management decisions. A negative result may occur with  improper specimen collection/handling, submission of specimen other than nasopharyngeal swab, presence of viral mutation(s) within the areas targeted by this assay, and inadequate number of viral copies(<138 copies/mL). A negative result must be combined with clinical observations, patient history, and epidemiological information. The expected result is Negative.  Fact Sheet for Patients:  BloggerCourse.com  Fact Sheet for Healthcare Providers:  SeriousBroker.it  This  test is no t yet approved or cleared by the [place] FDA and  has been authorized for detection and/or diagnosis of SARS-CoV-2 by FDA under an Emergency Use Authorization (EUA). This EUA will remain  in effect (meaning this test can be used) for the duration of the COVID-19 declaration under Section 564(b)(1) of the Act, 21 U.S.C.section 360bbb-3(b)(1), unless the authorization is terminated  or revoked sooner.       Influenza A by PCR NEGATIVE NEGATIVE Final   Influenza B by PCR NEGATIVE NEGATIVE Final    Comment: (NOTE) The Xpert Xpress SARS-CoV-2/FLU/RSV plus assay is intended as an aid in the diagnosis of influenza from Nasopharyngeal swab specimens and should not be used as a sole basis for treatment. Nasal washings and aspirates are unacceptable for Xpert Xpress SARS-CoV-2/FLU/RSV testing.  Fact Sheet for Patients: BloggerCourse.com  Fact Sheet for Healthcare Providers: SeriousBroker.it  This test is not yet approved or cleared by the [place] FDA and has been authorized for detection and/or diagnosis of SARS-CoV-2 by FDA under an Emergency Use Authorization (EUA). This EUA will remain in effect (meaning this test can be used) for the duration of the COVID-19 declaration under Section 564(b)(1) of the Act, 21 U.S.C. section 360bbb-3(b)(1), unless the authorization is terminated or revoked.     Resp Syncytial Virus by PCR NEGATIVE NEGATIVE Final    Comment: (NOTE) Fact Sheet for Patients: BloggerCourse.com  Fact Sheet for Healthcare Providers: SeriousBroker.it  This test is not yet approved or cleared by the [place] FDA and has been authorized for detection and/or diagnosis of SARS-CoV-2 by FDA under an Emergency Use Authorization (EUA). This EUA will remain in effect (meaning this test can be used) for the duration of the COVID-19 declaration under  Section 564(b)(1) of the Act, 21 U.S.C. section 360bbb-3(b)(1), unless the authorization is terminated or revoked.  Performed at [facility], [address] [place], [place] [address]   SARS Coronavirus 2 by RT PCR (hospital order, performed in [facility] hospital lab) *cepheid single result test* Anterior Nasal Swab     Status: None   Collection Time: 09/10/23 12:20 PM   Specimen: Anterior Nasal Swab  Result Value Ref Range Status   SARS Coronavirus 2 by RT PCR NEGATIVE NEGATIVE Final    Comment: (NOTE) SARS-CoV-2 target nucleic acids are NOT DETECTED.  The SARS-CoV-2 RNA is generally detectable in upper and lower respiratory specimens during the acute phase of infection. The lowest concentration of SARS-CoV-2 viral copies this assay can detect is 250 copies / mL. A negative result does not preclude SARS-CoV-2 infection and should not be used as the sole basis for treatment or other patient management decisions.  A negative result may occur with improper specimen collection / handling, submission of specimen other than nasopharyngeal swab, presence of viral mutation(s) within the areas targeted by this assay, and inadequate number of viral copies (<250 copies / mL). A negative result must be combined with clinical observations, patient history, and epidemiological information.  Fact Sheet for Patients:   RoadLapTop.co.za  Fact Sheet  for Healthcare Providers: http://kim-miller.com/  This test is not yet approved or  cleared by the [place] and has been authorized for detection and/or diagnosis of SARS-CoV-2 by FDA under an Emergency Use Authorization (EUA).  This EUA will remain in effect (meaning this test can be used) for the duration of the COVID-19 declaration under Section 564(b)(1) of the Act, 21 U.S.C. section 360bbb-3(b)(1), unless the authorization is terminated or revoked sooner.  Performed at  [facility], [address] [place], [place] [address]          Radiology Studies: CT ABDOMEN PELVIS WO CONTRAST Result Date: 09/13/2023 CLINICAL DATA:  Left lower quadrant pain EXAM: CT ABDOMEN AND PELVIS WITHOUT CONTRAST TECHNIQUE: Multidetector CT imaging of the abdomen and pelvis was performed following the standard protocol without IV contrast. RADIATION DOSE REDUCTION: This exam was performed according to the departmental dose-optimization program which includes automated exposure control, adjustment of the mA and/or kV according to patient size and/or use of iterative reconstruction technique. COMPARISON:  CT 09/02/2023, 10/20/2022 FINDINGS: Lower chest: Lung bases demonstrate large hiatal hernia. Interval small bilateral effusions and bibasilar airspace disease. Cardiomegaly. Hepatobiliary: No focal liver abnormality is seen. No gallstones, gallbladder wall thickening, or biliary dilatation. Pancreas: Unremarkable. No pancreatic ductal dilatation or surrounding inflammatory changes. Spleen: Normal in size without focal abnormality. Adrenals/Urinary Tract: Adrenal glands are within normal limits. Kidneys show no hydronephrosis. The bladder is unremarkable Stomach/Bowel: Stomach nonenlarged. No dilated small bowel. No acute bowel wall thickening. Redundant sigmoid colon with diffuse diverticular disease Vascular/Lymphatic: Aortic atherosclerosis. No enlarged abdominal or pelvic lymph nodes. Reproductive: Status post hysterectomy. No adnexal masses. Other: Negative for pelvic effusion or free air. Left inguinal hernia containing fat and small bowel but no incarceration or obstruction Musculoskeletal: Scoliosis and degenerative changes of the spine. Protrusion deformity of the right hip with advanced degenerative changes. Increased soft tissue thickening of the joint space with presence of small hip effusion. Since the prior exams, decreased fluid along the inter margin of right acetabulum.  IMPRESSION: 1. Small left inguinal hernia containing fat and small bowel but no obstruction or incarceration. Diverticular disease of left colon without acute inflammation 2. Interval small pleural effusions and basilar consolidations which may reflect atelectasis or pneumonia 3. Slightly progressive protrusio deformity of the right hip with diffuse suspected synovial thickening and small hip effusion but overall decreased soft tissue changes compared to the prior exams. Findings could be secondary to inflammatory arthropathy versus sequela of prior infection. Follow-up MRI may be obtained if there are symptoms referable to the right hip suggesting active disease. Electronically Signed   By: Jasmine Pang M.D.   On: 09/13/2023 18:12        Scheduled Meds:  aspirin EC  81 mg Oral Daily   benzonatate  200 mg Oral TID   calcium carbonate  1 tablet Oral BID WC   docusate sodium  100 mg Oral BID   enalapril  20 mg Oral Daily   guaiFENesin  600 mg Oral BID   levothyroxine  88 mcg Oral Q0600   methylPREDNISolone (SOLU-MEDROL) injection  40 mg Intravenous Daily   metoprolol tartrate  12.5 mg Oral BID   multivitamin with minerals  1 tablet Oral Daily   pantoprazole  40 mg Oral Daily   polyethylene glycol  17 g Oral Daily   simvastatin  40 mg Oral q1800   Continuous Infusions:  cefTRIAXone (ROCEPHIN)  IV 1 g (09/13/23 1654)   promethazine (PHENERGAN) injection (IM or  IVPB) Stopped (09/02/23 1107)     LOS: 13 days      Tresa Moore, MD [facility]   If 7PM-7AM, please contact night-coverage  09/14/2023, 2:48 PM

## 2023-09-14 NOTE — Plan of Care (Signed)
  Problem: Activity: Goal: Ability to tolerate increased activity will improve Outcome: Progressing   Problem: Health Behavior/Discharge Planning: Goal: Ability to manage health-related needs will improve Outcome: Progressing   Problem: Clinical Measurements: Goal: Ability to maintain clinical measurements within normal limits will improve Outcome: Progressing Goal: Diagnostic test results will improve Outcome: Progressing Goal: Respiratory complications will improve Outcome: Progressing   Problem: Activity: Goal: Risk for activity intolerance will decrease Outcome: Progressing   Problem: Nutrition: Goal: Adequate nutrition will be maintained Outcome: Progressing   Problem: Coping: Goal: Level of anxiety will decrease Outcome: Progressing   Problem: Pain Managment: Goal: General experience of comfort will improve and/or be controlled Outcome: Progressing

## 2023-09-14 NOTE — Consult Note (Signed)
 [facility] Liaison Note  09/14/2023  [name] 10-03-1935 [id number]  Location: RN Hospital Liaison screened the patient remotely at [facility].  Insurance: [employer]   Kathleen Paul is a 88 y.o. female who is a Primary Care Patient of Chestine Spore, Keane Scrape, NP [place] [employer] at [facility]. The patient was screened for readmission hospitalization with noted high risk score for unplanned readmission risk with 1 IP in 6 months.  The patient was assessed for potential Care Management service needs for post hospital transition for care coordination. Review of patient's electronic medical record reveals patient was admitted for Acute Blood Loss Anemia. Pt recommended for SNF and will transition to [facility] when medical stable. The facility will continue to address pt's ongoing needs post hospital discharge. No VBCI interventions at this time.   VBCI Care Management/Population Health does not replace or interfere with any arrangements made by the Inpatient Transition of Care team.   For questions contact:   Elliot Cousin, RN, BSN Hospital Liaison [place]   [facility], Population Health Office Hours MTWF  8:00 am-6:00 pm Direct Dial: [phone] mobile [email or web address]

## 2023-09-14 NOTE — Progress Notes (Signed)
 Physical Therapy Treatment Patient Details Name: Kathleen Paul MRN: [id number] DOB: [birth year] Today's Date: 09/14/2023   History of Present Illness Kathleen Paul is an 87yoF who [facility] 2/[facility] after fall at home. Noted elevation of troponin as well. Foundtohave Acute mildly displaced fracture of the mid right humeral diaphysis; managed non surgically. 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    PT Comments  Pt was still in recliner form earlier AM session. Pt endorses eagerness to return to bed. Pt required extensive max-total assist to safely return to bed form recliner via slide board transfers. Once seated EOB, pt required max assist to return to supine. Total assist to reposition in bed. Overall, pt continues to require extensive assistance to perform all desired task. DC recs remain appropriate. Acute PT will continue to follow until DC .     If plan is discharge home, recommend the following: A lot of help with walking and/or transfers;A lot of help with bathing/dressing/bathroom;Assist for transportation;Help with stairs or ramp for entrance     Equipment Recommendations  Other (comment)       Precautions / Restrictions Precautions Precautions: Fall;Shoulder Shoulder Interventions: Shoulder sling/immobilizer Recall of Precautions/Restrictions: Intact Required Braces or Orthoses: Splint/Cast;Sling;Other Brace Splint/Cast: RUE splint in 90 degrees. Noteable R hand swelling Splint/Cast - Date Prophylactic Dressing Applied (if applicable): 09/01/23 Restrictions Weight Bearing Restrictions Per Provider Order: Yes RUE Weight Bearing Per Provider Order: Non weight bearing     Mobility  Bed Mobility Overal bed mobility: Needs Assistance Bed Mobility: Sit to Supine  Supine to sit: Mod assist, HOB elevated, Used rails Sit to supine: Max assist, Total assist General bed mobility  comments: pt continues to require extensive assistance to return to supine form EOB sitting    Transfers Overall transfer level: Needs assistance Equipment used: Sliding board Transfers: Bed to chair/wheelchair/BSC Sit to Stand: Max assist, Total assist Stand pivot transfers: Total assist   Lateral/Scoot Transfers: Max assist, Total assist, With slide board General transfer comment: Pt requires extensive assistance to perform slideboard transfer from recliner to EOB. max vcs for sequencing/ Pt performed slideboard transfer towards L. gait belt + max-total assist required to fully achieve return to bed.    Ambulation/Gait    General Gait Details: unable/unsafe to even attempt   Balance Overall balance assessment: Needs assistance Sitting-balance support: No upper extremity supported, Feet supported Sitting balance-Leahy Scale: Good     Standing balance support: Single extremity supported, During functional activity, Reliant on assistive device for balance Standing balance-Leahy Scale: Zero Standing balance comment: pt struggles to even get to full erect standing. needs constant max-total assist to stand for very minimal amount of time. Do not recommend standing transfers at this time     Communication Communication Communication: No apparent difficulties  Cognition Arousal: Alert Behavior During Therapy: WFL for tasks assessed/performed   PT - Cognitive impairments: No apparent impairments    PT - Cognition Comments: Pt is A and O and cooperative throughout. Following commands: Intact      Cueing Cueing Techniques: Verbal cues, Tactile cues         Pertinent Vitals/Pain Pain Assessment Pain Assessment: 0-10 Pain Score: 6  Pain Location: R UE Pain Descriptors / Indicators: Headache, Discomfort, Dull Pain Intervention(s): Limited activity within patient's tolerance, Monitored during session, Premedicated before session, Repositioned     PT Goals (current goals can now  be found  in the care plan section) Acute Rehab PT Goals Patient Stated Goal: rehab then home Progress towards PT goals: Not progressing toward goals - comment    Frequency    7X/week       AM-PAC PT "6 Clicks" Mobility   Outcome Measure  Help needed turning from your back to your side while in a flat bed without using bedrails?: A Little Help needed moving from lying on your back to sitting on the side of a flat bed without using bedrails?: A Lot Help needed moving to and from a bed to a chair (including a wheelchair)?: A Lot Help needed standing up from a chair using your arms (e.g., wheelchair or bedside chair)?: Total Help needed to walk in hospital room?: Total Help needed climbing 3-5 steps with a railing? : Total 6 Click Score: 10    End of Session   Activity Tolerance: Patient limited by fatigue;Patient limited by pain Patient left: in bed;with call bell/phone within reach;with bed alarm set;with family/visitor present Nurse Communication: Mobility status PT Visit Diagnosis: Difficulty in walking, not elsewhere classified (R26.2);Other abnormalities of gait and mobility (R26.89);Repeated falls (R29.6);Muscle weakness (generalized) (M62.81)     Time: 0981-1914 PT Time Calculation (min) (ACUTE ONLY): 19 min  Charges:    $Therapeutic Activity: 8-22 mins PT General Charges $$ ACUTE PT VISIT: 1 Visit                     Jetta Lout PTA 09/14/23, 2:36 PM

## 2023-09-15 DIAGNOSIS — D62 Acute posthemorrhagic anemia: Secondary | ICD-10-CM | POA: Diagnosis not present

## 2023-09-15 NOTE — Progress Notes (Signed)
 PT Cancellation Note  Patient Details Name: Kathleen Paul MRN: [id number] DOB: [birth year]   Cancelled Treatment:     Therapist is to see pt this pm who stated she could not tolerate PT this date due to possibly "overdoing it yesterday". Pt c/o fatigue, congestion, and high BP as well. She states she has been exercising her LE's while in bed but can't tolerate any other activity. Will re-attempt per POC and progress as able.    Jannet Askew 09/15/2023, 2:59 PM

## 2023-09-15 NOTE — Progress Notes (Signed)
 PROGRESS NOTE    Kathleen Paul  ZOX:[id number] DOB: 11-07-1935 DOA: 09/01/2023 PCP: Doreene Nest, NP    Brief Narrative:   [phone] with h/o anxiety, gastric ulcer, HLD, DVT, HTN, and CAD who presented on 2/22 with a mechanical fall.  She was found to have a R humerus fracture and orthopedics was consulted.  She was also noted to have elevated troponin (25 -> 410 -> 1431, now downtrending) and cardiology was consulted, started heparin.  Developed ABLA with CT showing concern for humerus bleeding and heparin was stopped.  Transfused 3 units PRBC.  Echo without WMA, cardiology recommended conservative management.     Assessment & Plan:   Principal Problem:   Acute blood loss anemia (ABLA) Active Problems:   NSTEMI (non-ST elevated myocardial infarction) (HCC)   Fall   Closed right humeral fracture   AKI (acute kidney injury) (HCC)   Hyponatremia   Essential hypertension   Hyperkalemia   Lactic acidosis   Hypothyroidism   Dyslipidemia   Generalized weakness   GERD without esophagitis   PSVT (paroxysmal supraventricular tachycardia) (HCC) Mechanical fall Patient presented with mechanical fall that resulted in humerus fracture (nonoperative) She has had generalized weakness and will require SNF rehab.  Will continue daily therapy efforts while admitted.  Out of bed is much as possible.  Have excepted skilled nursing facility bed.  Insurance authorization pending. Plan: It is my medical recommendation for patient to transfer to a dedicated skilled nursing facility as the next level of care.  The patient is significantly weak and debilitated and off her baseline.  She is a max assist for transfers.  Prior to hospitalization despite patient's 3 failed knee TKA's the patient is ambulatory at baseline with a walker and was living and cooking independently.  She is able to perform the majority of ADLs at baseline and has recovery potential.  Continue to recommend skilled nursing facility as  a next level of care.   Acute blood loss anemia  Patient received 3 units total of packed red blood cells during the hospital course last being on 2/27 Patient does have bruising on the right arm CT scan showing hematoma right arm Hemoglobin 9.0 on 3/5.  No signs of blood loss.  No indication for transfusion.  Hemoglobin remained stable   Elevated troponin No chest pain  Troponin peaked at 1431 Likely related to demand ischemia Cardiology recommended conservative management Patient on aspirin, metoprolol, simvastatin   Stage 3a CKD Appears stable at this time Avoid nephrotoxic agents   Closed right spiral humeral fracture Orthopedic surgery recommending conservative management PT recommending hemiwalker and breg sling Taking Os-Cal for presumed associated osteoporosis Will need skilled nursing facility.  Bed search in progress. Continue therapy efforts until placed   Essential hypertension Restart metoprolol 3/5 Restart enalapril 3/6   LLL PNA COVID negative CXR with atelectasis vs. LLL infiltrate Rocephin and azithromycin.  Course completed Continue bronchodilator Antitussives mucolytic's Essential spirometry is   Hyponatremia Improved   Hypothyroidism Continue Synthroid   Dyslipidemia LDL 65 Continue simvastatin   PSVT (paroxysmal supraventricular tachycardia) Metoprolol tartrate 12.5 twice daily restarted 3/5   GERD without esophagitis Continue PPI therapy  Headache Described on top of the head.  Pounding in nature.  Concerning for migraine.  Trial Fioricet.,  Successful.  Headache resolved   DNR Confirmed on admission [facility] documents reviewed    DVT prophylaxis: SCD Code Status: DNR Family Communication: Daughter at bedside 3/6, 3/7, 3/8 Disposition Plan: Status is: Inpatient Remains inpatient  appropriate because: CAP with residual respiratory symptoms.  Patient is essentially medically ready for discharge.  Insurance authorization for skilled  nursing facility   Level of care: Telemetry Medical  Consultants:  None  Procedures:  None  Antimicrobials:   Subjective: Seen and examined.  Fatigued this morning.  Respiratory status improved.  Objective: Vitals:   09/14/23 1645 09/14/23 2058 09/14/23 2249 09/15/23 0733  BP: (!) 119/94  (!) 140/53 (!) 163/82  Pulse: 69  78 (!) 59  Resp:   20 16  Temp: 98 F (36.7 C)  97.9 F (36.6 C) 97.9 F (36.6 C)  TempSrc: Oral     SpO2: 96% 96% 97% 98%  Weight:      Height:        Intake/Output Summary (Last 24 hours) at 09/15/2023 1401 Last data filed at 09/15/2023 1200 Gross per 24 hour  Intake --  Output 1200 ml  Net -1200 ml   Filed Weights   09/01/23 1751  Weight: 62.6 kg    Examination:  General exam: No acute distress Respiratory system: Coarse basilar crackles.  Normal work of breathing.  Room air Cardiovascular system: S1-S2, RRR, no murmurs, no pedal edema Gastrointestinal system: Soft, NT/ND, normal bowel sounds Central nervous system: Alert and oriented. No focal neurological deficits. Extremities: Symmetric 5 x 5 power. Skin: No rashes, lesions or ulcers Psychiatry: Judgement and insight appear normal. Mood & affect appropriate.     Data Reviewed: I have personally reviewed following labs and imaging studies  CBC: Recent Labs  Lab 09/09/23 0557 09/10/23 0557 09/11/23 0537 09/12/23 0353 09/13/23 1415  WBC 5.9 18.7* 15.7* 12.4* 9.8  NEUTROABS 3.7 16.1* 13.5* 10.4* 7.5  HGB 9.6* 10.9* 8.6* 9.0* 9.1*  HCT 29.5* 32.3* 25.6* 27.1* 27.5*  MCV 92.2 89.2 89.5 90.0 91.4  PLT 142* 163 146* 160 183   Basic Metabolic Panel: Recent Labs  Lab 09/09/23 0557 09/10/23 0557 09/11/23 0537 09/12/23 0353 09/13/23 1415  NA 135 133* 130* 134* 132*  K 4.7 4.5 4.5 4.5 4.3  CL 101 98 100 102 100  CO2 28 25 23 24  20*  GLUCOSE 95 105* 97 108* 94  BUN 38* 40* 50* 42* 44*  CREATININE 0.98 0.96 1.09* 0.94 0.87  CALCIUM 8.9 8.7* 8.1* 8.4* 8.2*    GFR: Estimated Creatinine Clearance: 39 mL/min (by C-G formula based on SCr of 0.87 mg/dL). Liver Function Tests: No results for input(s): "AST", "ALT", "ALKPHOS", "BILITOT", "PROT", "ALBUMIN" in the last 168 hours. No results for input(s): "LIPASE", "AMYLASE" in the last 168 hours. No results for input(s): "AMMONIA" in the last 168 hours. Coagulation Profile: No results for input(s): "INR", "PROTIME" in the last 168 hours. Cardiac Enzymes: No results for input(s): "CKTOTAL", "CKMB", "CKMBINDEX", "TROPONINI" in the last 168 hours. BNP (last 3 results) No results for input(s): "PROBNP" in the last 8760 hours. HbA1C: No results for input(s): "HGBA1C" in the last 72 hours. CBG: No results for input(s): "GLUCAP" in the last 168 hours. Lipid Profile: No results for input(s): "CHOL", "HDL", "LDLCALC", "TRIG", "CHOLHDL", "LDLDIRECT" in the last 72 hours. Thyroid Function Tests: No results for input(s): "TSH", "T4TOTAL", "FREET4", "T3FREE", "THYROIDAB" in the last 72 hours. Anemia Panel: No results for input(s): "VITAMINB12", "FOLATE", "FERRITIN", "TIBC", "IRON", "RETICCTPCT" in the last 72 hours. Sepsis Labs: No results for input(s): "PROCALCITON", "LATICACIDVEN" in the last 168 hours.  Recent Results (from the past 240 hours)  Resp panel by RT-PCR (RSV, Flu A&B, Covid) Anterior Nasal Swab  Status: None   Collection Time: 09/05/23  5:19 PM   Specimen: Anterior Nasal Swab  Result Value Ref Range Status   SARS Coronavirus 2 by RT PCR NEGATIVE NEGATIVE Final    Comment: (NOTE) SARS-CoV-2 target nucleic acids are NOT DETECTED.  The SARS-CoV-2 RNA is generally detectable in upper respiratory specimens during the acute phase of infection. The lowest concentration of SARS-CoV-2 viral copies this assay can detect is 138 copies/mL. A negative result does not preclude SARS-Cov-2 infection and should not be used as the sole basis for treatment or other patient management decisions. A  negative result may occur with  improper specimen collection/handling, submission of specimen other than nasopharyngeal swab, presence of viral mutation(s) within the areas targeted by this assay, and inadequate number of viral copies(<138 copies/mL). A negative result must be combined with clinical observations, patient history, and epidemiological information. The expected result is Negative.  Fact Sheet for Patients:  BloggerCourse.com  Fact Sheet for Healthcare Providers:  SeriousBroker.it  This test is no t yet approved or cleared by the [place] FDA and  has been authorized for detection and/or diagnosis of SARS-CoV-2 by FDA under an Emergency Use Authorization (EUA). This EUA will remain  in effect (meaning this test can be used) for the duration of the COVID-19 declaration under Section 564(b)(1) of the Act, 21 U.S.C.section 360bbb-3(b)(1), unless the authorization is terminated  or revoked sooner.       Influenza A by PCR NEGATIVE NEGATIVE Final   Influenza B by PCR NEGATIVE NEGATIVE Final    Comment: (NOTE) The Xpert Xpress SARS-CoV-2/FLU/RSV plus assay is intended as an aid in the diagnosis of influenza from Nasopharyngeal swab specimens and should not be used as a sole basis for treatment. Nasal washings and aspirates are unacceptable for Xpert Xpress SARS-CoV-2/FLU/RSV testing.  Fact Sheet for Patients: BloggerCourse.com  Fact Sheet for Healthcare Providers: SeriousBroker.it  This test is not yet approved or cleared by the [place] FDA and has been authorized for detection and/or diagnosis of SARS-CoV-2 by FDA under an Emergency Use Authorization (EUA). This EUA will remain in effect (meaning this test can be used) for the duration of the COVID-19 declaration under Section 564(b)(1) of the Act, 21 U.S.C. section 360bbb-3(b)(1), unless the authorization  is terminated or revoked.     Resp Syncytial Virus by PCR NEGATIVE NEGATIVE Final    Comment: (NOTE) Fact Sheet for Patients: BloggerCourse.com  Fact Sheet for Healthcare Providers: SeriousBroker.it  This test is not yet approved or cleared by the [place] FDA and has been authorized for detection and/or diagnosis of SARS-CoV-2 by FDA under an Emergency Use Authorization (EUA). This EUA will remain in effect (meaning this test can be used) for the duration of the COVID-19 declaration under Section 564(b)(1) of the Act, 21 U.S.C. section 360bbb-3(b)(1), unless the authorization is terminated or revoked.  Performed at [facility], [address] [place], [place] [address]   SARS Coronavirus 2 by RT PCR (hospital order, performed in [facility] hospital lab) *cepheid single result test* Anterior Nasal Swab     Status: None   Collection Time: 09/10/23 12:20 PM   Specimen: Anterior Nasal Swab  Result Value Ref Range Status   SARS Coronavirus 2 by RT PCR NEGATIVE NEGATIVE Final    Comment: (NOTE) SARS-CoV-2 target nucleic acids are NOT DETECTED.  The SARS-CoV-2 RNA is generally detectable in upper and lower respiratory specimens during the acute phase of infection. The lowest concentration of SARS-CoV-2 viral copies  this assay can detect is 250 copies / mL. A negative result does not preclude SARS-CoV-2 infection and should not be used as the sole basis for treatment or other patient management decisions.  A negative result may occur with improper specimen collection / handling, submission of specimen other than nasopharyngeal swab, presence of viral mutation(s) within the areas targeted by this assay, and inadequate number of viral copies (<250 copies / mL). A negative result must be combined with clinical observations, patient history, and epidemiological information.  Fact Sheet for Patients:    RoadLapTop.co.za  Fact Sheet for Healthcare Providers: http://kim-miller.com/  This test is not yet approved or  cleared by the [place] FDA and has been authorized for detection and/or diagnosis of SARS-CoV-2 by FDA under an Emergency Use Authorization (EUA).  This EUA will remain in effect (meaning this test can be used) for the duration of the COVID-19 declaration under Section 564(b)(1) of the Act, 21 U.S.C. section 360bbb-3(b)(1), unless the authorization is terminated or revoked sooner.  Performed at [facility], [address] [place], [place] [address]          Radiology Studies: CT ABDOMEN PELVIS WO CONTRAST Result Date: 09/13/2023 CLINICAL DATA:  Left lower quadrant pain EXAM: CT ABDOMEN AND PELVIS WITHOUT CONTRAST TECHNIQUE: Multidetector CT imaging of the abdomen and pelvis was performed following the standard protocol without IV contrast. RADIATION DOSE REDUCTION: This exam was performed according to the departmental dose-optimization program which includes automated exposure control, adjustment of the mA and/or kV according to patient size and/or use of iterative reconstruction technique. COMPARISON:  CT 09/02/2023, 10/20/2022 FINDINGS: Lower chest: Lung bases demonstrate large hiatal hernia. Interval small bilateral effusions and bibasilar airspace disease. Cardiomegaly. Hepatobiliary: No focal liver abnormality is seen. No gallstones, gallbladder wall thickening, or biliary dilatation. Pancreas: Unremarkable. No pancreatic ductal dilatation or surrounding inflammatory changes. Spleen: Normal in size without focal abnormality. Adrenals/Urinary Tract: Adrenal glands are within normal limits. Kidneys show no hydronephrosis. The bladder is unremarkable Stomach/Bowel: Stomach nonenlarged. No dilated small bowel. No acute bowel wall thickening. Redundant sigmoid colon with diffuse diverticular disease Vascular/Lymphatic:  Aortic atherosclerosis. No enlarged abdominal or pelvic lymph nodes. Reproductive: Status post hysterectomy. No adnexal masses. Other: Negative for pelvic effusion or free air. Left inguinal hernia containing fat and small bowel but no incarceration or obstruction Musculoskeletal: Scoliosis and degenerative changes of the spine. Protrusion deformity of the right hip with advanced degenerative changes. Increased soft tissue thickening of the joint space with presence of small hip effusion. Since the prior exams, decreased fluid along the inter margin of right acetabulum. IMPRESSION: 1. Small left inguinal hernia containing fat and small bowel but no obstruction or incarceration. Diverticular disease of left colon without acute inflammation 2. Interval small pleural effusions and basilar consolidations which may reflect atelectasis or pneumonia 3. Slightly progressive protrusio deformity of the right hip with diffuse suspected synovial thickening and small hip effusion but overall decreased soft tissue changes compared to the prior exams. Findings could be secondary to inflammatory arthropathy versus sequela of prior infection. Follow-up MRI may be obtained if there are symptoms referable to the right hip suggesting active disease. Electronically Signed   By: Jasmine Pang M.D.   On: 09/13/2023 18:12        Scheduled Meds:  aspirin EC  81 mg Oral Daily   benzonatate  200 mg Oral TID   calcium carbonate  1 tablet Oral BID WC   docusate sodium  100 mg Oral BID  enalapril  20 mg Oral Daily   guaiFENesin  600 mg Oral BID   levothyroxine  88 mcg Oral Q0600   methylPREDNISolone (SOLU-MEDROL) injection  40 mg Intravenous Daily   metoprolol tartrate  12.5 mg Oral BID   multivitamin with minerals  1 tablet Oral Daily   pantoprazole  40 mg Oral Daily   polyethylene glycol  17 g Oral Daily   simvastatin  40 mg Oral q1800   Continuous Infusions:  promethazine (PHENERGAN) injection (IM or IVPB) Stopped  (09/02/23 1107)     LOS: 14 days      Tresa Moore, MD [facility]   If 7PM-7AM, please contact night-coverage  09/15/2023, 2:01 PM

## 2023-09-15 NOTE — TOC Progression Note (Signed)
 Transition of Care [facility]) - Progression Note    Patient Details  Name: Kathleen Paul MRN: [id number] Date of Birth: Jan 06, 1936  [facility] CM/SW Contact  Bing Quarry, RN Phone Number: 09/15/2023, 10:34 AM  Clinical Narrative:  3/5: Notified by HTA, Glenna Durand, that SNF review by NP indicated need for custodial care, not STR, with therapy vis Part B. Ambulance medical review still pending. Notified provider as if wants to do a Peer to Peer and will let HTA representative know the answer.  P2P contact information given to provider to call [facility] at [phone].    Gabriel Cirri MSN RN CM  RN Case Manager [place]  Transitions of Care Direct Dial: [phone] (Weekends Only) [facility] Main Office Phone: [phone] [facility] Fax: [phone] [email or web address]          Expected Discharge Plan and Services                                               Social Determinants of Health (SDOH) Interventions SDOH Screenings   Food Insecurity: No Food Insecurity (09/02/2023)  Housing: Low Risk  (09/02/2023)  Transportation Needs: No Transportation Needs (09/02/2023)  Utilities: Not At Risk (09/02/2023)  Alcohol Screen: Low Risk  (12/18/2022)  Depression (PHQ2-9): Low Risk  (12/18/2022)  Financial Resource Strain: Low Risk  (12/18/2022)  Physical Activity: Inactive (12/18/2022)  Social Connections: Socially Isolated (09/02/2023)  Stress: No Stress Concern Present (12/18/2022)  Tobacco Use: Medium Risk (09/01/2023)    Readmission Risk Interventions     No data to display

## 2023-09-16 DIAGNOSIS — D62 Acute posthemorrhagic anemia: Secondary | ICD-10-CM | POA: Diagnosis not present

## 2023-09-16 MED ORDER — SIMETHICONE 80 MG PO CHEW
80.0000 mg | CHEWABLE_TABLET | Freq: Four times a day (QID) | ORAL | Status: DC
  Administered 2023-09-16 – 2023-09-18 (×8): 80 mg via ORAL
  Filled 2023-09-16 (×14): qty 1

## 2023-09-16 MED ORDER — HYDROCHLOROTHIAZIDE 12.5 MG PO TABS
12.5000 mg | ORAL_TABLET | Freq: Every day | ORAL | Status: DC
  Administered 2023-09-16 – 2023-09-19 (×4): 12.5 mg via ORAL
  Filled 2023-09-16 (×4): qty 1

## 2023-09-16 NOTE — Progress Notes (Signed)
 PROGRESS NOTE    Kathleen Paul  BJY:[id number] DOB: Apr 19, 1936 DOA: 09/01/2023 PCP: Doreene Nest, NP    Brief Narrative:   [phone] with h/o anxiety, gastric ulcer, HLD, DVT, HTN, and CAD who presented on 2/22 with a mechanical fall.  She was found to have a R humerus fracture and orthopedics was consulted.  She was also noted to have elevated troponin (25 -> 410 -> 1431, now downtrending) and cardiology was consulted, started heparin.  Developed ABLA with CT showing concern for humerus bleeding and heparin was stopped.  Transfused 3 units PRBC.  Echo without WMA, cardiology recommended conservative management.     Assessment & Plan:   Principal Problem:   Acute blood loss anemia (ABLA) Active Problems:   NSTEMI (non-ST elevated myocardial infarction) (HCC)   Fall   Closed right humeral fracture   AKI (acute kidney injury) (HCC)   Hyponatremia   Essential hypertension   Hyperkalemia   Lactic acidosis   Hypothyroidism   Dyslipidemia   Generalized weakness   GERD without esophagitis   PSVT (paroxysmal supraventricular tachycardia) (HCC) Mechanical fall Patient presented with mechanical fall that resulted in humerus fracture (nonoperative) She has had generalized weakness and will require SNF rehab.  Will continue daily therapy efforts while admitted.  Out of bed is much as possible.  Have excepted skilled nursing facility bed.  Insurance authorization pending. Plan: It is my medical recommendation for patient to transfer to a dedicated skilled nursing facility as the next level of care.  The patient is significantly weak and debilitated and off her baseline.  She is a max assist for transfers.  Prior to hospitalization despite patient's 3 failed knee TKA's the patient is ambulatory at baseline with a walker and was living and cooking independently.  She is able to perform the majority of ADLs at baseline and has recovery potential.  Main barrier to ambulation is the fracture of the  right humerus and functional decline preventing effective ambulation.   Acute blood loss anemia  Patient received 3 units total of packed red blood cells during the hospital course last being on 2/27 Patient does have bruising on the right arm CT scan showing hematoma right arm Hemoglobin 9.0 on 3/5.  No signs of blood loss.  No indication for transfusion.  Hemoglobin remained stable   Elevated troponin No chest pain  Troponin peaked at 1431 Likely related to demand ischemia Cardiology recommended conservative management Patient on aspirin, metoprolol, simvastatin   Stage 3a CKD Appears stable at this time Avoid nephrotoxic agents   Closed right spiral humeral fracture Orthopedic surgery recommending conservative management PT recommending hemiwalker and breg sling Taking Os-Cal for presumed associated osteoporosis Would recommend SNF as next level of care   Essential hypertension Restart metoprolol 3/5 Restart enalapril 3/6   LLL PNA COVID negative CXR with atelectasis vs. LLL infiltrate Rocephin and azithromycin.  Course completed Continue bronchodilator Antitussives mucolytic's Essential spirometry is   Hyponatremia Improved   Hypothyroidism Continue Synthroid   Dyslipidemia LDL 65 Continue simvastatin   PSVT (paroxysmal supraventricular tachycardia) Metoprolol tartrate 12.5 twice daily restarted 3/5   GERD without esophagitis Continue PPI therapy  Headache Described on top of the head.  Pounding in nature.  Concerning for migraine.  Trial Fioricet.,  Successful.  Headache resolved   DNR Confirmed on admission [facility] documents reviewed    DVT prophylaxis: SCD Code Status: DNR Family Communication: Daughter at bedside 3/6, 3/7, 3/8, son and daughter at bedside 3/9 Disposition Plan: Status  is: Inpatient Remains inpatient appropriate because: CAP with residual respiratory symptoms.  Patient is essentially medically ready for discharge.  Insurance  authorization for skilled nursing facility   Level of care: Telemetry Medical  Consultants:  None  Procedures:  None  Antimicrobials:   Subjective: Seen and examined.  Fatigued this morning.  Respiratory status improved.  Objective: Vitals:   09/15/23 1431 09/15/23 2140 09/16/23 0842 09/16/23 1430  BP: (!) 154/72 (!) 144/55 (!) 167/82 (!) 160/76  Pulse: 83 79 66 87  Resp: 16 20 18 20   Temp: 97.9 F (36.6 C) 97.8 F (36.6 C) 97.6 F (36.4 C) 97.6 F (36.4 C)  TempSrc:  Oral    SpO2: 96% 96% 96% 90%  Weight:      Height:        Intake/Output Summary (Last 24 hours) at 09/16/2023 1438 Last data filed at 09/16/2023 1407 Gross per 24 hour  Intake 240 ml  Output 1100 ml  Net -860 ml   Filed Weights   09/01/23 1751  Weight: 62.6 kg    Examination:  General exam: No acute distress Respiratory system: Coarse basilar crackles.  Normal work of breathing.  Room air Cardiovascular system: S1-S2, RRR, no murmurs, no pedal edema Gastrointestinal system: Soft, NT/ND, normal bowel sounds Central nervous system: Alert and oriented. No focal neurological deficits. Extremities: Symmetric 5 x 5 power. Skin: No rashes, lesions or ulcers Psychiatry: Judgement and insight appear normal. Mood & affect appropriate.     Data Reviewed: I have personally reviewed following labs and imaging studies  CBC: Recent Labs  Lab 09/10/23 0557 09/11/23 0537 09/12/23 0353 09/13/23 1415  WBC 18.7* 15.7* 12.4* 9.8  NEUTROABS 16.1* 13.5* 10.4* 7.5  HGB 10.9* 8.6* 9.0* 9.1*  HCT 32.3* 25.6* 27.1* 27.5*  MCV 89.2 89.5 90.0 91.4  PLT 163 146* 160 183   Basic Metabolic Panel: Recent Labs  Lab 09/10/23 0557 09/11/23 0537 09/12/23 0353 09/13/23 1415  NA 133* 130* 134* 132*  K 4.5 4.5 4.5 4.3  CL 98 100 102 100  CO2 25 23 24  20*  GLUCOSE 105* 97 108* 94  BUN 40* 50* 42* 44*  CREATININE 0.96 1.09* 0.94 0.87  CALCIUM 8.7* 8.1* 8.4* 8.2*   GFR: Estimated Creatinine Clearance: 39  mL/min (by C-G formula based on SCr of 0.87 mg/dL). Liver Function Tests: No results for input(s): "AST", "ALT", "ALKPHOS", "BILITOT", "PROT", "ALBUMIN" in the last 168 hours. No results for input(s): "LIPASE", "AMYLASE" in the last 168 hours. No results for input(s): "AMMONIA" in the last 168 hours. Coagulation Profile: No results for input(s): "INR", "PROTIME" in the last 168 hours. Cardiac Enzymes: No results for input(s): "CKTOTAL", "CKMB", "CKMBINDEX", "TROPONINI" in the last 168 hours. BNP (last 3 results) No results for input(s): "PROBNP" in the last 8760 hours. HbA1C: No results for input(s): "HGBA1C" in the last 72 hours. CBG: No results for input(s): "GLUCAP" in the last 168 hours. Lipid Profile: No results for input(s): "CHOL", "HDL", "LDLCALC", "TRIG", "CHOLHDL", "LDLDIRECT" in the last 72 hours. Thyroid Function Tests: No results for input(s): "TSH", "T4TOTAL", "FREET4", "T3FREE", "THYROIDAB" in the last 72 hours. Anemia Panel: No results for input(s): "VITAMINB12", "FOLATE", "FERRITIN", "TIBC", "IRON", "RETICCTPCT" in the last 72 hours. Sepsis Labs: No results for input(s): "PROCALCITON", "LATICACIDVEN" in the last 168 hours.  Recent Results (from the past 240 hours)  SARS Coronavirus 2 by RT PCR (hospital order, performed in [facility] hospital lab) *cepheid single result test* Anterior Nasal Swab  Status: None   Collection Time: 09/10/23 12:20 PM   Specimen: Anterior Nasal Swab  Result Value Ref Range Status   SARS Coronavirus 2 by RT PCR NEGATIVE NEGATIVE Final    Comment: (NOTE) SARS-CoV-2 target nucleic acids are NOT DETECTED.  The SARS-CoV-2 RNA is generally detectable in upper and lower respiratory specimens during the acute phase of infection. The lowest concentration of SARS-CoV-2 viral copies this assay can detect is 250 copies / mL. A negative result does not preclude SARS-CoV-2 infection and should not be used as the sole basis for treatment or  other patient management decisions.  A negative result may occur with improper specimen collection / handling, submission of specimen other than nasopharyngeal swab, presence of viral mutation(s) within the areas targeted by this assay, and inadequate number of viral copies (<250 copies / mL). A negative result must be combined with clinical observations, patient history, and epidemiological information.  Fact Sheet for Patients:   RoadLapTop.co.za  Fact Sheet for Healthcare Providers: http://kim-miller.com/  This test is not yet approved or  cleared by the [place] FDA and has been authorized for detection and/or diagnosis of SARS-CoV-2 by FDA under an Emergency Use Authorization (EUA).  This EUA will remain in effect (meaning this test can be used) for the duration of the COVID-19 declaration under Section 564(b)(1) of the Act, 21 U.S.C. section 360bbb-3(b)(1), unless the authorization is terminated or revoked sooner.  Performed at [facility], [address], [place], [place] [address]          Radiology Studies: No results found.       Scheduled Meds:  aspirin EC  81 mg Oral Daily   benzonatate  200 mg Oral TID   calcium carbonate  1 tablet Oral BID WC   docusate sodium  100 mg Oral BID   enalapril  20 mg Oral Daily   guaiFENesin  600 mg Oral BID   hydrochlorothiazide  12.5 mg Oral Daily   levothyroxine  88 mcg Oral Q0600   methylPREDNISolone (SOLU-MEDROL) injection  40 mg Intravenous Daily   metoprolol tartrate  12.5 mg Oral BID   multivitamin with minerals  1 tablet Oral Daily   pantoprazole  40 mg Oral Daily   polyethylene glycol  17 g Oral Daily   simethicone  80 mg Oral QID   simvastatin  40 mg Oral q1800   Continuous Infusions:  promethazine (PHENERGAN) injection (IM or IVPB) Stopped (09/02/23 1107)     LOS: 15 days      Tresa Moore, MD [facility]   If 7PM-7AM,  please contact night-coverage  09/16/2023, 2:38 PM

## 2023-09-16 NOTE — TOC Progression Note (Signed)
 Transition of Care [facility]) - Progression Note    Patient Details  Name: AMEERAH HUFFSTETLER MRN: [id number] Date of Birth: August 15, 1935  [facility] CM/SW Contact  Bing Quarry, RN Phone Number: 09/16/2023, 3:22 PM  Clinical Narrative:  3/9: RN TOC note from yesterday:  "3/8: Notified by HTA, Glenna Durand, that SNF review by NP indicated need for custodial care, not STR, with therapy vis Part B. Ambulance medical review still pending. Notified provider as if wants to do a Peer to Peer and will let HTA representative know the answer. P2P contact information given to provider to call [name] N.P. at [phone]."  3/9: P2P completed yesterday with NP which  NP stated patient needed more custodial care with part B. Provider documented reasons patient should be eligible for STR based on baseline prior to admission and related to current issues.   3/9: HTA medical director spoke with patient son several times yesterday and today.  And just received call from HTA representative, Arlyss Queen, who said denial is being faxed to [facility] indicating needs ongoing medical needs from their point of view, but not that patient was not eligible for STR upon medical readiness. Resubmit insurance authorization upon medical readiness for STR.   Ambulance authorization  [phone] and is good though discharge from this admission.   Denial letter received, HTA Medical Director has spoke to patient as well. Provider read a copy as well.   Copy sent to patient's son.    Gabriel Cirri MSN RN CM  RN Case Manager [place]  Transitions of Care Direct Dial: [phone] (Weekends Only) [facility] Main Office Phone: [phone] [facility] Fax: [phone] [email or web address]          Expected Discharge Plan and Services                                               Social Determinants of Health (SDOH) Interventions SDOH Screenings   Food Insecurity: No Food Insecurity (09/02/2023)  Housing: Low Risk   (09/02/2023)  Transportation Needs: No Transportation Needs (09/02/2023)  Utilities: Not At Risk (09/02/2023)  Alcohol Screen: Low Risk  (12/18/2022)  Depression (PHQ2-9): Low Risk  (12/18/2022)  Financial Resource Strain: Low Risk  (12/18/2022)  Physical Activity: Inactive (12/18/2022)  Social Connections: Socially Isolated (09/02/2023)  Stress: No Stress Concern Present (12/18/2022)  Tobacco Use: Medium Risk (09/01/2023)    Readmission Risk Interventions     No data to display

## 2023-09-16 NOTE — Plan of Care (Signed)
  Problem: Education: Goal: Understanding of cardiac disease, CV risk reduction, and recovery process will improve Outcome: Progressing   Problem: Activity: Goal: Ability to tolerate increased activity will improve Outcome: Progressing   Problem: Cardiac: Goal: Ability to achieve and maintain adequate cardiovascular perfusion will improve Outcome: Progressing   Problem: Health Behavior/Discharge Planning: Goal: Ability to safely manage health-related needs after discharge will improve Outcome: Progressing   Problem: Education: Goal: Knowledge of General Education information will improve Description: Including pain rating scale, medication(s)/side effects and non-pharmacologic comfort measures Outcome: Progressing   Problem: Health Behavior/Discharge Planning: Goal: Ability to manage health-related needs will improve Outcome: Progressing   Problem: Clinical Measurements: Goal: Ability to maintain clinical measurements within normal limits will improve Outcome: Progressing Goal: Will remain free from infection Outcome: Progressing Goal: Diagnostic test results will improve Outcome: Progressing Goal: Respiratory complications will improve Outcome: Progressing Goal: Cardiovascular complication will be avoided Outcome: Progressing   Problem: Activity: Goal: Risk for activity intolerance will decrease Outcome: Progressing   Problem: Nutrition: Goal: Adequate nutrition will be maintained Outcome: Progressing   Problem: Coping: Goal: Level of anxiety will decrease Outcome: Progressing   Problem: Elimination: Goal: Will not experience complications related to bowel motility Outcome: Progressing Goal: Will not experience complications related to urinary retention Outcome: Progressing   Problem: Pain Managment: Goal: General experience of comfort will improve and/or be controlled Outcome: Progressing   Problem: Safety: Goal: Ability to remain free from injury will  improve Outcome: Progressing   Problem: Skin Integrity: Goal: Risk for impaired skin integrity will decrease Outcome: Progressing

## 2023-09-16 NOTE — Progress Notes (Signed)
 Physical Therapy Treatment Patient Details Name: Kathleen Paul MRN: [id number] DOB: 1936/04/21 Today's Date: 09/16/2023   History of Present Illness Kathleen Paul is an 87yoF who [facility] after fall at home. Noted elevation of troponin as well. Foundtohave Acute mildly displaced fracture of the mid right humeral diaphysis; managed non surgically. 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    PT Comments  Pt ready to get up.  Feeling better today.  Transitions to EOB with rails and HOB elevated with mod/max a x 1.  Steady in sitting.  She is able to stand with +2 assist and on second attempt transfers to drop arm recliner with squat/stand pivot transfer.  Once seated does AROM BLE's.  Remained up.  RN assisted with transfer.  Encouraged to use stedy lift is she struggles this pm with transfer.   If plan is discharge home, recommend the following: A lot of help with walking and/or transfers;A lot of help with bathing/dressing/bathroom;Assist for transportation;Help with stairs or ramp for entrance;Assistance with cooking/housework   Can travel by private vehicle     No  Equipment Recommendations       Recommendations for Other Services       Precautions / Restrictions Precautions Precautions: Fall;Shoulder Shoulder Interventions: Shoulder sling/immobilizer Recall of Precautions/Restrictions: Intact Required Braces or Orthoses: Splint/Cast;Sling;Other Brace Splint/Cast: RUE splint in 90 degrees. Noteable R hand swelling Splint/Cast - Date Prophylactic Dressing Applied (if applicable): 09/01/23 Restrictions Weight Bearing Restrictions Per Provider Order: Yes RUE Weight Bearing Per Provider Order: Non weight bearing     Mobility  Bed Mobility Overal bed mobility: Needs Assistance Bed Mobility: Supine to Sit     Supine to sit: Mod assist, HOB elevated, Used rails, Max assist           Transfers Overall transfer level: Needs assistance Equipment used: 2 person hand held assist Transfers: Sit to/from Stand, Bed to chair/wheelchair/BSC Sit to Stand: Mod assist, Max assist, +2 physical assistance Stand pivot transfers: Max assist, +2 physical assistance         General transfer comment: did better standing today and was able to transfer to drop arm recliner with mod/max a x 2.    Ambulation/Gait               General Gait Details: unable/unsafe to even attempt   Stairs             Wheelchair Mobility     Tilt Bed    Modified Rankin (Stroke Patients Only)       Balance Overall balance assessment: Needs assistance Sitting-balance support: No upper extremity supported, Feet supported Sitting balance-Leahy Scale: Good Sitting balance - Comments: no difficulty or LOB while seated EOB.   Standing balance support: Single extremity supported, During functional activity, Reliant on assistive device for balance Standing balance-Leahy Scale: Zero Standing balance comment: unable to get fully erect but is able to transfer today with +2 assist without sliding board                            Communication    Cognition Arousal: Alert Behavior During Therapy: [facility] for tasks assessed/performed   PT - Cognitive impairments: No apparent impairments  Cueing    Exercises Other Exercises Other Exercises: self directed seated AROM    General Comments        Pertinent Vitals/Pain Pain Assessment Pain Assessment: Faces Faces Pain Scale: Hurts little more Pain Location: R UE Pain Descriptors / Indicators: Sore Pain Intervention(s): Limited activity within patient's tolerance, Monitored during session, Premedicated before session, Repositioned    Home Living                          Prior Function            PT Goals (current goals can now be found in the care plan  section) Progress towards PT goals: Progressing toward goals    Frequency    7X/week      PT Plan      Co-evaluation              AM-PAC PT "6 Clicks" Mobility   Outcome Measure  Help needed turning from your back to your side while in a flat bed without using bedrails?: A Lot Help needed moving from lying on your back to sitting on the side of a flat bed without using bedrails?: Total Help needed moving to and from a bed to a chair (including a wheelchair)?: A Lot Help needed standing up from a chair using your arms (e.g., wheelchair or bedside chair)?: A Lot Help needed to walk in hospital room?: Total Help needed climbing 3-5 steps with a railing? : Total 6 Click Score: 9    End of Session Equipment Utilized During Treatment: Gait belt Activity Tolerance: Patient tolerated treatment well Patient left: in chair;with chair alarm set;with call bell/phone within reach Nurse Communication: Mobility status PT Visit Diagnosis: Difficulty in walking, not elsewhere classified (R26.2);Other abnormalities of gait and mobility (R26.89);Repeated falls (R29.6);Muscle weakness (generalized) (M62.81)     Time: 0865-7846 PT Time Calculation (min) (ACUTE ONLY): 32 min  Charges:    $Therapeutic Exercise: 8-22 mins PT General Charges $$ ACUTE PT VISIT: 1 Visit                   Danielle Dess, PTA 09/16/23, 10:13 AM

## 2023-09-17 ENCOUNTER — Inpatient Hospital Stay

## 2023-09-17 ENCOUNTER — Ambulatory Visit: Payer: HMO | Admitting: Internal Medicine

## 2023-09-17 DIAGNOSIS — D62 Acute posthemorrhagic anemia: Secondary | ICD-10-CM | POA: Diagnosis not present

## 2023-09-17 LAB — CBC WITH DIFFERENTIAL/PLATELET
Abs Immature Granulocytes: 1.35 10*3/uL — ABNORMAL HIGH (ref 0.00–0.07)
Basophils Absolute: 0 10*3/uL (ref 0.0–0.1)
Basophils Relative: 0 %
Eosinophils Absolute: 0 10*3/uL (ref 0.0–0.5)
Eosinophils Relative: 0 %
HCT: 32.7 % — ABNORMAL LOW (ref 36.0–46.0)
Hemoglobin: 11 g/dL — ABNORMAL LOW (ref 12.0–15.0)
Immature Granulocytes: 14 %
Lymphocytes Relative: 12 %
Lymphs Abs: 1.2 10*3/uL (ref 0.7–4.0)
MCH: 30.4 pg (ref 26.0–34.0)
MCHC: 33.6 g/dL (ref 30.0–36.0)
MCV: 90.3 fL (ref 80.0–100.0)
Monocytes Absolute: 1.4 10*3/uL — ABNORMAL HIGH (ref 0.1–1.0)
Monocytes Relative: 14 %
Neutro Abs: 6 10*3/uL (ref 1.7–7.7)
Neutrophils Relative %: 60 %
Platelets: 273 10*3/uL (ref 150–400)
RBC: 3.62 MIL/uL — ABNORMAL LOW (ref 3.87–5.11)
RDW: 22.7 % — ABNORMAL HIGH (ref 11.5–15.5)
WBC: 10 10*3/uL (ref 4.0–10.5)
nRBC: 0 % (ref 0.0–0.2)

## 2023-09-17 LAB — BASIC METABOLIC PANEL
Anion gap: 12 (ref 5–15)
BUN: 44 mg/dL — ABNORMAL HIGH (ref 8–23)
CO2: 21 mmol/L — ABNORMAL LOW (ref 22–32)
Calcium: 8.9 mg/dL (ref 8.9–10.3)
Chloride: 102 mmol/L (ref 98–111)
Creatinine, Ser: 0.68 mg/dL (ref 0.44–1.00)
GFR, Estimated: >60 mL/min (ref >60)
Glucose, Bld: 91 mg/dL (ref 70–99)
Potassium: 3.7 mmol/L (ref 3.5–5.1)
Sodium: 135 mmol/L (ref 135–145)

## 2023-09-17 MED ORDER — PREDNISONE 20 MG PO TABS
20.0000 mg | ORAL_TABLET | Freq: Every day | ORAL | Status: DC
  Administered 2023-09-17 – 2023-09-19 (×3): 20 mg via ORAL
  Filled 2023-09-17 (×3): qty 1

## 2023-09-17 NOTE — Progress Notes (Signed)
 Occupational Therapy Re-Evaluation Patient Details Name: Kathleen Paul MRN: [id number] DOB: [birth year] Today's Date: 09/17/2023   History of present illness Kathleen Paul is an 87yoF who comes to [facility] on 2/22 after fall at home. Noted elevation of troponin as well. Foundtohave Acute mildly displaced fracture of the mid right humeral diaphysis; managed non surgically. 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   OT comments  [name] was seen for OT re-evaluation on this date. Upon arrival to room pt seated EOB with PTA, agreeable to tx. Pt requires SETUP seated grooming tasks using non dominant LUE. MOD A x2 + RW (single UE only) for sit<>stand x3 trials and bed>chair step pivot t/f. Improved activity tolerance and participation this session with pt motivated to return to near baseline level of MOD IND. Pt making good progress toward goals, will continue to follow POC. Discharge recommendation remains appropriate.       If plan is discharge home, recommend the following:  Two people to help with walking and/or transfers;Two people to help with bathing/dressing/bathroom;Help with stairs or ramp for entrance   Equipment Recommendations  BSC/3in1;Wheelchair (measurements OT)    Recommendations for Other Services      Precautions / Restrictions Precautions Precautions: Fall;Shoulder Shoulder Interventions: Shoulder sling/immobilizer Recall of Precautions/Restrictions: Intact Splint/Cast - Date Prophylactic Dressing Applied (if applicable): 09/01/23 Restrictions Weight Bearing Restrictions Per Provider Order: Yes RUE Weight Bearing Per Provider Order: Non weight bearing       Mobility Bed Mobility               General bed mobility comments: not tested    Transfers Overall transfer level: Needs assistance Equipment used: Rolling walker (2 wheels), 1 person hand held  assist Transfers: Sit to/from Stand, Bed to chair/wheelchair/BSC Sit to Stand: Mod assist, +2 physical assistance     Step pivot transfers: Mod assist, +2 physical assistance           Balance Overall balance assessment: Needs assistance Sitting-balance support: No upper extremity supported, Feet supported Sitting balance-Leahy Scale: Good     Standing balance support: Single extremity supported Standing balance-Leahy Scale: Poor                             ADL either performed or assessed with clinical judgement   ADL Overall ADL's : Needs assistance/impaired                                       General ADL Comments: SETUP seated grooming tasks. MOD A x2 + RW (single UE only) for simulated BSC t/f    Extremity/Trunk Assessment Upper Extremity Assessment Upper Extremity Assessment: Overall WFL for tasks assessed;RUE deficits/detail RUE: Unable to fully assess due to immobilization   Lower Extremity Assessment Lower Extremity Assessment: Generalized weakness                 Communication Communication Communication: No apparent difficulties Factors Affecting Communication: Hearing impaired   Cognition Arousal: Alert Behavior During Therapy: WFL for tasks assessed/performed                                 Following commands: Intact  Pertinent Vitals/ Pain       Pain Assessment Pain Assessment: Faces Faces Pain Scale: Hurts little more Pain Location: R UE Pain Descriptors / Indicators: Sore Pain Intervention(s): Limited activity within patient's tolerance, Premedicated before session   Frequency  Min 1X/week        Progress Toward Goals  OT Goals(current goals can now be found in the care plan section)  Progress towards OT goals: Progressing toward goals  Acute Rehab OT Goals Patient Stated Goal: go home OT Goal Formulation: With patient Time For Goal Achievement:  10/01/23 Potential to Achieve Goals: Good ADL Goals Pt Will Perform Grooming: with min assist;sitting Pt Will Perform Lower Body Dressing: with min assist;sit to/from stand;with caregiver independent in assisting Pt Will Transfer to Toilet: with min assist;stand pivot transfer;bedside commode  Plan      Co-evaluation    PT/OT/SLP Co-Evaluation/Treatment: Yes Reason for Co-Treatment: Complexity of the patient's impairments (multi-system involvement) PT goals addressed during session: Mobility/safety with mobility OT goals addressed during session: ADL's and self-care;Proper use of Adaptive equipment and DME      AM-PAC OT "6 Clicks" Daily Activity     Outcome Measure   Help from another person eating meals?: None Help from another person taking care of personal grooming?: A Little Help from another person toileting, which includes using toliet, bedpan, or urinal?: A Lot Help from another person bathing (including washing, rinsing, drying)?: A Lot Help from another person to put on and taking off regular upper body clothing?: A Little Help from another person to put on and taking off regular lower body clothing?: A Lot 6 Click Score: 16    End of Session Equipment Utilized During Treatment: Gait belt  OT Visit Diagnosis: Unsteadiness on feet (R26.81)   Activity Tolerance Patient tolerated treatment well   Patient Left in chair;with call bell/phone within reach;with chair alarm set   Nurse Communication          Time: [phone] OT Time Calculation (min): 17 min  Charges: OT General Charges $OT Visit: 1 Visit OT Evaluation $OT Re-eval: 1 Re-eval  [name], M.S. OTR/L  09/17/23, 10:10 AM  ascom [phone]

## 2023-09-17 NOTE — TOC Progression Note (Signed)
 Transition of Care [facility]) - Progression Note    Patient Details  Name: Kathleen Paul MRN: [id number] Date of Birth: 1936-05-30  [facility] CM/SW Contact  [name], RN Phone Number: 09/17/2023, 9:19 AM  Clinical Narrative:    Spoke with the family about the denial, I called [facility] and spoke with Tammy, she stated that their Medical Director Dr Logan Bores feels that the patient may have an illeus I reached out to the attending and a KUB has been ordered, we will restart the ins auth once complete and medically ready        Expected Discharge Plan and Services                                               Social Determinants of Health (SDOH) Interventions SDOH Screenings   Food Insecurity: No Food Insecurity (09/02/2023)  Housing: Low Risk  (09/02/2023)  Transportation Needs: No Transportation Needs (09/02/2023)  Utilities: Not At Risk (09/02/2023)  Alcohol Screen: Low Risk  (12/18/2022)  Depression (PHQ2-9): Low Risk  (12/18/2022)  Financial Resource Strain: Low Risk  (12/18/2022)  Physical Activity: Inactive (12/18/2022)  Social Connections: Socially Isolated (09/02/2023)  Stress: No Stress Concern Present (12/18/2022)  Tobacco Use: Medium Risk (09/01/2023)    Readmission Risk Interventions     No data to display

## 2023-09-17 NOTE — TOC Progression Note (Addendum)
 Transition of Care [facility]) - Progression Note    Patient Details  Name: Kathleen Paul MRN: [id number] Date of Birth: 31-Mar-1936  [facility]) CM/SW Contact  [name], RN Phone Number: 09/17/2023, 10:35 AM  Clinical Narrative:     Patient family called and stated that [facility] not able [facility] take the family and they chose [employer],        Expected Discharge Plan and Services                                               Social Determinants of Health (SDOH) Interventions SDOH Screenings   Food Insecurity: No Food Insecurity (09/02/2023)  Housing: Low Risk  (09/02/2023)  Transportation Needs: No Transportation Needs (09/02/2023)  Utilities: Not At Risk (09/02/2023)  Alcohol Screen: Low Risk  (12/18/2022)  Depression (PHQ2-9): Low Risk  (12/18/2022)  Financial Resource Strain: Low Risk  (12/18/2022)  Physical Activity: Inactive (12/18/2022)  Social Connections: Socially Isolated (09/02/2023)  Stress: No Stress Concern Present (12/18/2022)  Tobacco Use: Medium Risk (09/01/2023)    Readmission Risk Interventions     No data to display

## 2023-09-17 NOTE — Progress Notes (Addendum)
 PROGRESS NOTE    Kathleen Paul  HYQ:[id number] DOB: 1936-01-19 DOA: 09/01/2023 PCP: Doreene Nest, NP    Brief Narrative:   [phone] with h/o anxiety, gastric ulcer, HLD, DVT, HTN, and CAD who presented on 2/22 with a mechanical fall.  She was found to have a R humerus fracture and orthopedics was consulted.  She was also noted to have elevated troponin (25 -> 410 -> 1431, now downtrending) and cardiology was consulted, started heparin.  Developed ABLA with CT showing concern for humerus bleeding and heparin was stopped.  Transfused 3 units PRBC.  Echo without WMA, cardiology recommended conservative management.     Assessment & Plan:   Principal Problem:   Acute blood loss anemia (ABLA) Active Problems:   NSTEMI (non-ST elevated myocardial infarction) (HCC)   Fall   Closed right humeral fracture   AKI (acute kidney injury) (HCC)   Hyponatremia   Essential hypertension   Hyperkalemia   Lactic acidosis   Hypothyroidism   Dyslipidemia   Generalized weakness   GERD without esophagitis   PSVT (paroxysmal supraventricular tachycardia) (HCC)  Mechanical fall Patient presented with mechanical fall that resulted in humerus fracture (nonoperative) She has had generalized weakness and will require SNF rehab.  Will continue daily therapy efforts while admitted.  Out of bed is much as possible.  Have excepted skilled nursing facility bed.  Insurance authorization pending. Plan: It is my medical recommendation for patient to transfer to a dedicated skilled nursing facility as the next level of care.  The patient is significantly weak and debilitated and off her baseline.  She is a max assist for transfers.  Prior to hospitalization despite patient's 3 failed knee TKA's the patient is ambulatory at baseline with a walker and was living and cooking independently.  She is able to perform the majority of ADLs at baseline and has recovery potential.  Main barrier to ambulation is the fracture of  the right humerus and functional decline preventing effective ambulation.  Elevated troponin No chest pain  Troponin peaked at 1431 Likely related to demand ischemia Cardiology recommended conservative management Patient on aspirin, metoprolol, simvastatin Plan: There is no clinical indication that patient is suffering ongoing cardiac ischemia.  Her limited exercise tolerance is not associated with any chest pain.  I feel this is more orthopedic in nature and limited exercise tolerance is due to physical deconditioning.  As stated previously no further inpatient cardiology workup has been recommended at this time.  Patient is chest pain-free  Concern for ileus, ruled OUT Patients Manufacturing engineer stated he had concern for possible ileus.  Patient has been having bowel movements.  KUB on 3/10 negative for ileus or obstruction.  Shows non-specific bowel gas pattern: Plan: Monitor for regular BM NO EVIDENCE OF ILEUS Medically appropriate for discharge to SNF   Acute blood loss anemia  Patient received 3 units total of packed red blood cells during the hospital course last being on 2/27 Patient does have bruising on the right arm CT scan showing hematoma right arm Plan: Hemoglobin stable at 11.0 as of 3/10.  No indication for transfusion.   Stage 3a CKD Appears stable at this time Avoid nephrotoxic agents   Closed right spiral humeral fracture Orthopedic surgery recommending conservative management PT recommending hemiwalker and breg sling Taking Os-Cal for presumed associated osteoporosis Strong recommendation for skilled nursing facility as Will of care   Essential hypertension Restart metoprolol 3/5 Restart enalapril 3/6   LLL PNA COVID negative CXR with  atelectasis vs. LLL infiltrate Rocephin and azithromycin.  Course completed Continue bronchodilator Antitussives mucolytic's Essential spirometry is   Hyponatremia Improved   Hypothyroidism Continue  Synthroid   Dyslipidemia LDL 65 Continue simvastatin   PSVT (paroxysmal supraventricular tachycardia) Metoprolol tartrate 12.5 twice daily restarted 3/5   GERD without esophagitis Continue PPI therapy  Headache Described on top of the head.  Pounding in nature.  Concerning for migraine.  Trial Fioricet.,  Successful.  Headache resolved   DNR Confirmed on admission [facility] documents reviewed    DVT prophylaxis: SCD Code Status: DNR Family Communication: Daughter at bedside 3/6, 3/7, 3/8, son and daughter at bedside 3/9, bedside 3/10 Disposition Plan: Status is: Inpatient Remains inpatient appropriate because:   Unsafe discharge plan.  Patient will need skilled nursing facility as Of care.  There was some concern for ileus, KUB ordered and pending.  Patient is having bowel movements making this less likely.  Also concern for ongoing cardiac ischemic chest pain.  This is felt to be unlikely.  Clinical presentation is inconsistent with ongoing cardiac ischemia.   Level of care: Telemetry Medical  Consultants:  None  Procedures:  None  Antimicrobials:   Subjective: Seen and examined.  Remains on room air.  Fatigued after working with therapy services.  Objective: Vitals:   09/16/23 2049 09/17/23 0426 09/17/23 0750 09/17/23 1053  BP: (!) 146/72 (!) 151/82 (!) 156/62   Pulse: 68 64 (!) 54 67  Resp: 18 18 18    Temp: 97.9 F (36.6 C) 97.7 F (36.5 C) 97.9 F (36.6 C)   TempSrc: Oral Oral Oral   SpO2: 96% 97% 97%   Weight:      Height:        Intake/Output Summary (Last 24 hours) at 09/17/2023 1456 Last data filed at 09/17/2023 1033 Gross per 24 hour  Intake 120 ml  Output 1400 ml  Net -1280 ml   Filed Weights   09/01/23 1751  Weight: 62.6 kg    Examination:  General exam: NAD Respiratory system: Bilateral crackles.  Normal work of breathing.  Room air Cardiovascular system: S1-S2, RRR, no murmurs, no pedal edema Gastrointestinal system: Soft, NT/ND,  normal bowel sounds Central nervous system: Alert and oriented. No focal neurological deficits. Extremities: Decreased power bilateral lower extremities.  Gait slowly improving. Skin: No rashes, lesions or ulcers Psychiatry: Judgement and insight appear normal. Mood & affect appropriate.     Data Reviewed: I have personally reviewed following labs and imaging studies  CBC: Recent Labs  Lab 09/11/23 0537 09/12/23 0353 09/13/23 1415 09/17/23 0811  WBC 15.7* 12.4* 9.8 10.0  NEUTROABS 13.5* 10.4* 7.5 6.0  HGB 8.6* 9.0* 9.1* 11.0*  HCT 25.6* 27.1* 27.5* 32.7*  MCV 89.5 90.0 91.4 90.3  PLT 146* 160 183 273   Basic Metabolic Panel: Recent Labs  Lab 09/11/23 0537 09/12/23 0353 09/13/23 1415 09/17/23 0811  NA 130* 134* 132* 135  K 4.5 4.5 4.3 3.7  CL 100 102 100 102  CO2 23 24 20* 21*  GLUCOSE 97 108* 94 91  BUN 50* 42* 44* 44*  CREATININE 1.09* 0.94 0.87 0.68  CALCIUM 8.1* 8.4* 8.2* 8.9   GFR: Estimated Creatinine Clearance: 42.4 mL/min (by C-G formula based on SCr of 0.68 mg/dL). Liver Function Tests: No results for input(s): "AST", "ALT", "ALKPHOS", "BILITOT", "PROT", "ALBUMIN" in the last 168 hours. No results for input(s): "LIPASE", "AMYLASE" in the last 168 hours. No results for input(s): "AMMONIA" in the last 168 hours. Coagulation Profile: No results  for input(s): "INR", "PROTIME" in the last 168 hours. Cardiac Enzymes: No results for input(s): "CKTOTAL", "CKMB", "CKMBINDEX", "TROPONINI" in the last 168 hours. BNP (last 3 results) No results for input(s): "PROBNP" in the last 8760 hours. HbA1C: No results for input(s): "HGBA1C" in the last 72 hours. CBG: No results for input(s): "GLUCAP" in the last 168 hours. Lipid Profile: No results for input(s): "CHOL", "HDL", "LDLCALC", "TRIG", "CHOLHDL", "LDLDIRECT" in the last 72 hours. Thyroid Function Tests: No results for input(s): "TSH", "T4TOTAL", "FREET4", "T3FREE", "THYROIDAB" in the last 72 hours. Anemia  Panel: No results for input(s): "VITAMINB12", "FOLATE", "FERRITIN", "TIBC", "IRON", "RETICCTPCT" in the last 72 hours. Sepsis Labs: No results for input(s): "PROCALCITON", "LATICACIDVEN" in the last 168 hours.  Recent Results (from the past 240 hours)  SARS Coronavirus 2 by RT PCR (hospital order, performed in [facility] hospital lab) *cepheid single result test* Anterior Nasal Swab     Status: None   Collection Time: 09/10/23 12:20 PM   Specimen: Anterior Nasal Swab  Result Value Ref Range Status   SARS Coronavirus 2 by RT PCR NEGATIVE NEGATIVE Final    Comment: (NOTE) SARS-CoV-2 target nucleic acids are NOT DETECTED.  The SARS-CoV-2 RNA is generally detectable in upper and lower respiratory specimens during the acute phase of infection. The lowest concentration of SARS-CoV-2 viral copies this assay can detect is 250 copies / mL. A negative result does not preclude SARS-CoV-2 infection and should not be used as the sole basis for treatment or other patient management decisions.  A negative result may occur with improper specimen collection / handling, submission of specimen other than nasopharyngeal swab, presence of viral mutation(s) within the areas targeted by this assay, and inadequate number of viral copies (<250 copies / mL). A negative result must be combined with clinical observations, patient history, and epidemiological information.  Fact Sheet for Patients:   RoadLapTop.co.za  Fact Sheet for Healthcare Providers: http://kim-miller.com/  This test is not yet approved or  cleared by the [place] FDA and has been authorized for detection and/or diagnosis of SARS-CoV-2 by FDA under an Emergency Use Authorization (EUA).  This EUA will remain in effect (meaning this test can be used) for the duration of the COVID-19 declaration under Section 564(b)(1) of the Act, 21 U.S.C. section 360bbb-3(b)(1), unless the authorization is  terminated or revoked sooner.  Performed at [facility], [address] [place], [place] [address]          Radiology Studies: No results found.       Scheduled Meds:  aspirin EC  81 mg Oral Daily   benzonatate  200 mg Oral TID   calcium carbonate  1 tablet Oral BID WC   docusate sodium  100 mg Oral BID   enalapril  20 mg Oral Daily   guaiFENesin  600 mg Oral BID   hydrochlorothiazide  12.5 mg Oral Daily   levothyroxine  88 mcg Oral Q0600   metoprolol tartrate  12.5 mg Oral BID   multivitamin with minerals  1 tablet Oral Daily   pantoprazole  40 mg Oral Daily   polyethylene glycol  17 g Oral Daily   predniSONE  20 mg Oral Q breakfast   simethicone  80 mg Oral QID   simvastatin  40 mg Oral q1800   Continuous Infusions:  promethazine (PHENERGAN) injection (IM or IVPB) Stopped (09/02/23 1107)     LOS: 16 days      Tresa Moore, MD [facility]  If 7PM-7AM, please contact night-coverage  09/17/2023, 2:56 PM

## 2023-09-17 NOTE — Plan of Care (Signed)
  Problem: Education: Goal: Understanding of cardiac disease, CV risk reduction, and recovery process will improve Outcome: Progressing   Problem: Activity: Goal: Ability to tolerate increased activity will improve Outcome: Not Progressing   Problem: Cardiac: Goal: Ability to achieve and maintain adequate cardiovascular perfusion will improve Outcome: Progressing   Problem: Health Behavior/Discharge Planning: Goal: Ability to safely manage health-related needs after discharge will improve Outcome: Progressing   Problem: Education: Goal: Knowledge of General Education information will improve Description: Including pain rating scale, medication(s)/side effects and non-pharmacologic comfort measures Outcome: Progressing   Problem: Health Behavior/Discharge Planning: Goal: Ability to manage health-related needs will improve Outcome: Progressing   Problem: Clinical Measurements: Goal: Ability to maintain clinical measurements within normal limits will improve Outcome: Progressing Goal: Will remain free from infection Outcome: Progressing Goal: Diagnostic test results will improve Outcome: Progressing Goal: Respiratory complications will improve Outcome: Progressing Goal: Cardiovascular complication will be avoided Outcome: Progressing   Problem: Nutrition: Goal: Adequate nutrition will be maintained Outcome: Progressing   Problem: Coping: Goal: Level of anxiety will decrease Outcome: Progressing   Problem: Elimination: Goal: Will not experience complications related to bowel motility Outcome: Progressing Goal: Will not experience complications related to urinary retention Outcome: Progressing   Problem: Pain Managment: Goal: General experience of comfort will improve and/or be controlled Outcome: Progressing   Problem: Safety: Goal: Ability to remain free from injury will improve Outcome: Progressing   Problem: Skin Integrity: Goal: Risk for impaired skin  integrity will decrease Outcome: Progressing

## 2023-09-17 NOTE — Progress Notes (Signed)
 Physical Therapy Treatment Patient Details Name: Kathleen Paul MRN: [id number] DOB: Jul 29, 1935 Today's Date: 09/17/2023   History of Present Illness Kathleen Paul is an 87yoF who comes to [facility] on 2/22 after fall at home. Noted elevation of troponin as well. Foundtohave Acute mildly displaced fracture of the mid right humeral diaphysis; managed non surgically. 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    PT Comments  Pt stated she is feeling better today.  She is premedicated prior to session for optimal pain control with mobility.  She is given time and with use of bed features she is able to get to EOB with mod a x 1.  She is encouraged to bridge to get hips to EOB but stated she is limited due to shoulder pain and needs assist.  Once sitting she is steady.  Lateral scooting left and right remains challenging for pt and is unable to make any functional progress limited by friction on sheets.  She is able to stand x 2 from EOB from mod a x 2 pushing from handrail of bed initially then using Clinical research associate for support. She does have some post lean needing assist to remain upright stating she is fearful of falling.  On 3rd attempt she is given RW for L hand support and overall standing quality is improved. She remains NWB R UE in sling. She is unable to step to the right but on second trial she is able to take functional steps today to left and transition to recliner at bedside.  She does need mod a x 2 to step and max encouragement to keep stepping to chair but overall showed good progress today.  Discussed PLOF with pt.  She stated she has used rollator for several years prior but was ambulatory in her home.  She remains highly motivated to increase functional independence in hopes of returning home when she is recovered.  While initial progress has been slow due to medical concerns and general malaise from pneumonia,  she is feeling better and even when feeling poorly she remained motivated to do as much as she could.  Pt remains a good rehab candidate due to PLOF, she is showing gains at this time and high motivation makes her more likely to put in max effort during rehab stay.  Rehab would allow her the best chance at becoming more independent and decreasing caregiver burden on family to allow her to return home.     If plan is discharge home, recommend the following: A lot of help with walking and/or transfers;A lot of help with bathing/dressing/bathroom;Assist for transportation;Help with stairs or ramp for entrance;Assistance with cooking/housework   Can travel by private vehicle        Equipment Recommendations       Recommendations for Other Services       Precautions / Restrictions Precautions Precautions: Fall;Shoulder Shoulder Interventions: Shoulder sling/immobilizer Recall of Precautions/Restrictions: Intact Required Braces or Orthoses: Splint/Cast;Sling;Other Brace Splint/Cast: RUE splint in 90 degrees. Noteable R hand swelling Splint/Cast - Date Prophylactic Dressing Applied (if applicable): 09/01/23 Restrictions Weight Bearing Restrictions Per Provider Order: Yes RUE Weight Bearing Per Provider Order: Non weight bearing     Mobility  Bed Mobility Overal bed mobility: Needs Assistance Bed Mobility: Supine to Sit     Supine to sit: Mod assist, HOB elevated, Used rails     General bed mobility comments: is able to mover her  feet more towards edge of bed today than prior but has trouble with bridging to get hips to EOB due to shoulder discomfort with attempts Patient Response: Cooperative  Transfers Overall transfer level: Needs assistance Equipment used: 2 person hand held assist, Rolling walker (2 wheels) Transfers: Sit to/from Stand, Bed to chair/wheelchair/BSC Sit to Stand: Mod assist, +2 physical assistance Stand pivot transfers: Max assist, +2 physical assistance Step  pivot transfers: Mod assist, +2 physical assistance      Lateral/Scoot Transfers: Total assist General transfer comment: did better standing today and was able to take several small steps to recliner today with 1 hand on RW for support and mod encouragement and assist    Ambulation/Gait         Gait velocity: decreased     General Gait Details: no true gait but is able to step today to recliner to left   Stairs             Wheelchair Mobility     Tilt Bed Tilt Bed Patient Response: Cooperative  Modified Rankin (Stroke Patients Only)       Balance Overall balance assessment: Needs assistance Sitting-balance support: No upper extremity supported, Feet supported Sitting balance-Leahy Scale: Good   Postural control: Posterior lean Standing balance support: Single extremity supported, During functional activity, Reliant on assistive device for balance Standing balance-Leahy Scale: Poor Standing balance comment: +2 assist for supports to prevent fall.                            Communication Communication Communication: No apparent difficulties Factors Affecting Communication: Hearing impaired  Cognition Arousal: Alert Behavior During Therapy: WFL for tasks assessed/performed   PT - Cognitive impairments: No apparent impairments                         Following commands: Intact      Cueing Cueing Techniques: Verbal cues, Tactile cues  Exercises      General Comments        Pertinent Vitals/Pain Pain Assessment Pain Assessment: Faces Faces Pain Scale: Hurts even more Pain Location: R UE Pain Descriptors / Indicators: Sore Pain Intervention(s): Limited activity within patient's tolerance, Monitored during session, Premedicated before session, Repositioned    Home Living                          Prior Function            PT Goals (current goals can now be found in the care plan section) Progress towards PT  goals: Progressing toward goals    Frequency    7X/week      PT Plan      Co-evaluation PT/OT/SLP Co-Evaluation/Treatment: Yes Reason for Co-Treatment: Complexity of the patient's impairments (multi-system involvement) PT goals addressed during session: Mobility/safety with mobility OT goals addressed during session: ADL's and self-care;Proper use of Adaptive equipment and DME      AM-PAC PT "6 Clicks" Mobility   Outcome Measure  Help needed turning from your back to your side while in a flat bed without using bedrails?: A Lot Help needed moving from lying on your back to sitting on the side of a flat bed without using bedrails?: A Lot Help needed moving to and from a bed to a chair (including a wheelchair)?: A Lot Help needed standing up from a chair using your arms (e.g., wheelchair or bedside  chair)?: A Lot Help needed to walk in hospital room?: Total Help needed climbing 3-5 steps with a railing? : Total 6 Click Score: 10    End of Session Equipment Utilized During Treatment: Gait belt Activity Tolerance: Patient tolerated treatment well Patient left: in chair;with chair alarm set;with call bell/phone within reach Nurse Communication: Mobility status PT Visit Diagnosis: Difficulty in walking, not elsewhere classified (R26.2);Other abnormalities of gait and mobility (R26.89);Repeated falls (R29.6);Muscle weakness (generalized) (M62.81)     Time: 2355-7322 PT Time Calculation (min) (ACUTE ONLY): 25 min  Charges:    $Therapeutic Activity: 23-37 mins PT General Charges $$ ACUTE PT VISIT: 1 Visit                  Danielle Dess, PTA 09/17/23, 9:33 AM

## 2023-09-18 DIAGNOSIS — D62 Acute posthemorrhagic anemia: Secondary | ICD-10-CM | POA: Diagnosis not present

## 2023-09-18 NOTE — Progress Notes (Signed)
 Physical Therapy Treatment Patient Details Name: Kathleen Paul MRN: [id number] DOB: [birth year] Today's Date: 09/18/2023   History of Present Illness Kathleen Paul is an 87yoF who comes to [facility] on 2/22 after fall at home. Noted elevation of troponin as well. Foundtohave Acute mildly displaced fracture of the mid right humeral diaphysis; managed non surgically. 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    PT Comments  Pt ready and motivated for session.  To EOB with bed features and she does a better job today moving her legs towards edge of bed.  She does still need assist for upper body and to scoot hips forward as she is limited due to NWB RUE at this time.  Steady in sitting with no LOB for extended periods of time.  She stands x 2 with RW for L hand support and to make her feel more secure.  Right and post lean for 2 trials but tolerated increased standing time.  She is able to take several sidesteps to left and on 3rd trial is able to step to chair with increased step count which required less assist from staff to guide her to the chair.    Pt remains highly motivated and while gains are small, she is showing daily progress.  SNF remains appropriate for transition as she continues to show high motivation and is able to tolerate longer therapy sessions with increased mobility.     If plan is discharge home, recommend the following: A lot of help with walking and/or transfers;A lot of help with bathing/dressing/bathroom;Assist for transportation;Help with stairs or ramp for entrance;Assistance with cooking/housework   Can travel by private vehicle        Equipment Recommendations       Recommendations for Other Services       Precautions / Restrictions Precautions Precautions: Fall;Shoulder Shoulder Interventions: Shoulder sling/immobilizer Recall of Precautions/Restrictions: Intact Required  Braces or Orthoses: Splint/Cast;Sling;Other Brace Splint/Cast: RUE splint in 90 degrees. Noteable R hand swelling Splint/Cast - Date Prophylactic Dressing Applied (if applicable): 09/01/23 Restrictions Weight Bearing Restrictions Per Provider Order: Yes RUE Weight Bearing Per Provider Order: Non weight bearing     Mobility  Bed Mobility Overal bed mobility: Needs Assistance Bed Mobility: Supine to Sit     Supine to sit: Mod assist, HOB elevated, Used rails     General bed mobility comments: continues to need mod assist but generally less than yesterday.  able to get her legs to EOB better and some improved upper body movement but still needs mod a to complete Patient Response: Cooperative  Transfers Overall transfer level: Needs assistance Equipment used: Rolling walker (2 wheels), 1 person hand held assist   Sit to Stand: Mod assist, +2 physical assistance                Ambulation/Gait Ambulation/Gait assistance: Mod assist, +2 physical assistance Gait Distance (Feet): 3 Feet Assistive device: Rolling walker (2 wheels) Gait Pattern/deviations: Step-to pattern Gait velocity: decreased     General Gait Details: she is able to increase steps today from 3 to 5 small steps allowing for more complete transfer to chair with less staff guidance   Stairs             Wheelchair Mobility     Tilt Bed Tilt Bed Patient Response: Cooperative  Modified Rankin (Stroke Patients Only)       Balance Overall balance assessment: Needs  assistance Sitting-balance support: No upper extremity supported, Feet supported Sitting balance-Leahy Scale: Good     Standing balance support: Single extremity supported Standing balance-Leahy Scale: Poor Standing balance comment: +2 assist for supports to prevent fall.                            Communication Communication Communication: No apparent difficulties Factors Affecting Communication: Hearing impaired   Cognition Arousal: Alert Behavior During Therapy: WFL for tasks assessed/performed   PT - Cognitive impairments: No apparent impairments                         Following commands: Intact      Cueing Cueing Techniques: Verbal cues, Tactile cues  Exercises      General Comments        Pertinent Vitals/Pain Pain Assessment Pain Assessment: 0-10 Faces Pain Scale: Hurts even more Pain Location: R UE Pain Descriptors / Indicators: Sore Pain Intervention(s): RN gave pain meds during session, Limited activity within patient's tolerance, Monitored during session, Repositioned    Home Living                          Prior Function            PT Goals (current goals can now be found in the care plan section) Progress towards PT goals: Progressing toward goals    Frequency    7X/week      PT Plan      Co-evaluation              AM-PAC PT "6 Clicks" Mobility   Outcome Measure  Help needed turning from your back to your side while in a flat bed without using bedrails?: A Lot Help needed moving from lying on your back to sitting on the side of a flat bed without using bedrails?: A Lot Help needed moving to and from a bed to a chair (including a wheelchair)?: A Lot Help needed standing up from a chair using your arms (e.g., wheelchair or bedside chair)?: A Lot Help needed to walk in hospital room?: A Lot Help needed climbing 3-5 steps with a railing? : Total 6 Click Score: 11    End of Session Equipment Utilized During Treatment: Gait belt Activity Tolerance: Patient tolerated treatment well Patient left: in chair;with chair alarm set;with call bell/phone within reach Nurse Communication: Mobility status PT Visit Diagnosis: Difficulty in walking, not elsewhere classified (R26.2);Other abnormalities of gait and mobility (R26.89);Repeated falls (R29.6);Muscle weakness (generalized) (M62.81)     Time: 4098-1191 PT Time Calculation (min)  (ACUTE ONLY): 23 min  Charges:    $Therapeutic Activity: 23-37 mins PT General Charges $$ ACUTE PT VISIT: 1 Visit                   Danielle Dess, PTA 09/18/23, 10:16 AM

## 2023-09-18 NOTE — TOC Progression Note (Signed)
 Transition of Care [facility]) - Progression Note    Patient Details  Name: DANISSA RUNDLE MRN: [id number] Date of Birth: May 08, 1936  [facility]) CM/SW Contact  [name], RN Phone Number: 09/18/2023, 9:15 AM  Clinical Narrative:    Reached out and left a secure VM for [facility] Tammy and asked to restart the auth for approval to go to [facility] and rehab, awaiting a response        Expected Discharge Plan and Services                                               Social Determinants of Health (SDOH) Interventions SDOH Screenings   Food Insecurity: No Food Insecurity (09/02/2023)  Housing: Low Risk  (09/02/2023)  Transportation Needs: No Transportation Needs (09/02/2023)  Utilities: Not At Risk (09/02/2023)  Alcohol Screen: Low Risk  (12/18/2022)  Depression (PHQ2-9): Low Risk  (12/18/2022)  Financial Resource Strain: Low Risk  (12/18/2022)  Physical Activity: Inactive (12/18/2022)  Social Connections: Socially Isolated (09/02/2023)  Stress: No Stress Concern Present (12/18/2022)  Tobacco Use: Medium Risk (09/01/2023)    Readmission Risk Interventions     No data to display

## 2023-09-18 NOTE — Progress Notes (Signed)
 PROGRESS NOTE    Kathleen Paul  WUJ:[id number] DOB: [birth year] DOA: 09/01/2023 PCP: Doreene Nest, NP    Brief Narrative:   [phone] with h/o anxiety, gastric ulcer, HLD, DVT, HTN, and CAD who presented on 2/22 with a mechanical fall.  She was found to have a R humerus fracture and orthopedics was consulted.  She was also noted to have elevated troponin (25 -> 410 -> 1431, now downtrending) and cardiology was consulted, started heparin.  Developed ABLA with CT showing concern for humerus bleeding and heparin was stopped.  Transfused 3 units PRBC.  Echo without WMA, cardiology recommended conservative management.     Assessment & Plan:   Principal Problem:   Acute blood loss anemia (ABLA) Active Problems:   NSTEMI (non-ST elevated myocardial infarction) (HCC)   Fall   Closed right humeral fracture   AKI (acute kidney injury) (HCC)   Hyponatremia   Essential hypertension   Hyperkalemia   Lactic acidosis   Hypothyroidism   Dyslipidemia   Generalized weakness   GERD without esophagitis   PSVT (paroxysmal supraventricular tachycardia) (HCC)  Mechanical fall Patient presented with mechanical fall that resulted in humerus fracture (nonoperative) She has had generalized weakness and will require SNF rehab.  Will continue daily therapy efforts while admitted.  Out of bed is much as possible.  Have excepted skilled nursing facility bed.  Insurance authorization pending. Plan: It is my medical recommendation for patient to transfer to a dedicated skilled nursing facility as the next level of care.  The patient is significantly weak and debilitated and off her baseline.  She is a max assist for transfers.  Prior to hospitalization despite patient's 3 failed knee TKA's the patient is ambulatory at baseline with a walker and was living and cooking independently.  She is able to perform the majority of ADLs at baseline and has recovery potential.  Main barrier to ambulation is the fracture of  the right humerus and functional decline preventing effective ambulation. -She is currently medically stable and appropriate for transfer to skilled nursing facility.  Insurance authorization restarted.  Elevated troponin No chest pain  Troponin peaked at 1431 Likely related to demand ischemia Cardiology recommended conservative management Patient on aspirin, metoprolol, simvastatin Plan: There is no clinical indication that patient is suffering ongoing cardiac ischemia.  Her limited exercise tolerance is not associated with any chest pain.  I feel this is more orthopedic in nature and limited exercise tolerance is due to physical deconditioning.  As stated previously no further inpatient cardiology workup has been recommended at this time.  Patient is chest pain-free  Concern for ileus, ruled OUT Patients Manufacturing engineer stated he had concern for possible ileus.  Patient has been having bowel movements.  KUB on 3/10 negative for ileus or obstruction.  Shows non-specific bowel gas pattern: Plan: Patient having regular BMs NO EVIDENCE OF ILEUS Medically appropriate for discharge to SNF   Acute blood loss anemia  Patient received 3 units total of packed red blood cells during the hospital course last being on 2/27 Patient does have bruising on the right arm CT scan showing hematoma right arm Plan: Hemoglobin stable at 11.0 as of 3/10.  No indication for transfusion.   Stage 3a CKD Appears stable at this time Avoid nephrotoxic agents   Closed right spiral humeral fracture Orthopedic surgery recommending conservative management PT recommending hemiwalker and breg sling Taking Os-Cal for presumed associated osteoporosis Continue to recommend skilled nursing facility as next level of care  Essential hypertension Restart metoprolol 3/5 Restart enalapril 3/6   LLL PNA COVID negative CXR with atelectasis vs. LLL infiltrate Rocephin and azithromycin.  Course  completed Continue bronchodilator Antitussives mucolytic's Incentive spirometry   Hyponatremia Improved   Hypothyroidism Continue Synthroid   Dyslipidemia LDL 65 Continue simvastatin   PSVT (paroxysmal supraventricular tachycardia) Metoprolol tartrate 12.5 twice daily restarted 3/5   GERD without esophagitis Continue PPI therapy  Headache Described on top of the head.  Pounding in nature.  Concerning for migraine.  Trial Fioricet.,  Successful.  Headache resolved   DNR Confirmed on admission [facility] documents reviewed    DVT prophylaxis: SCD Code Status: DNR Family Communication: Daughter at bedside 3/6, 3/7, 3/8, son and daughter at bedside 3/9, bedside 3/10 Disposition Plan: Status is: Inpatient Remains inpatient appropriate because:   Unsafe discharge plan.  Patient would benefit from skilled nursing facility as next level of care.  No evidence of ileus or ongoing cardiac ischemia.  Patient is medically appropriate for discharge at this time.  Insurance authorization restarted.    Level of care: Telemetry Medical  Consultants:  None  Procedures:  None  Antimicrobials:   Subjective: Seen and examined.  Remains on room air.  Sitting up on edge of bed.  Energy level slowly improving.  Objective: Vitals:   09/17/23 1943 09/18/23 0432 09/18/23 0552 09/18/23 0758  BP: (!) 158/69 (!) 180/80 (!) 162/78 (!) 155/71  Pulse: 70 61  74  Resp: 18 16  16   Temp: 97.9 F (36.6 C) 97.8 F (36.6 C)  98.2 F (36.8 C)  TempSrc: Oral Oral  Oral  SpO2: 98% 98%  98%  Weight:      Height:        Intake/Output Summary (Last 24 hours) at 09/18/2023 1409 Last data filed at 09/18/2023 1038 Gross per 24 hour  Intake 0 ml  Output 2100 ml  Net -2100 ml   Filed Weights   09/01/23 1751  Weight: 62.6 kg    Examination:  General exam: No acute distress Respiratory system: Coarse bibasilar crackles.  Normal work of breathing.  Room air Cardiovascular system: S1-S2, RRR,  no murmurs, no pedal edema Gastrointestinal system: Soft, NT/ND, normal bowel sounds Central nervous system: Alert and oriented. No focal neurological deficits. Extremities: Decreased power bilateral lower extremities.  Gait slowly improving. Skin: No rashes, lesions or ulcers Psychiatry: Judgement and insight appear normal. Mood & affect appropriate.     Data Reviewed: I have personally reviewed following labs and imaging studies  CBC: Recent Labs  Lab 09/12/23 0353 09/13/23 1415 09/17/23 0811  WBC 12.4* 9.8 10.0  NEUTROABS 10.4* 7.5 6.0  HGB 9.0* 9.1* 11.0*  HCT 27.1* 27.5* 32.7*  MCV 90.0 91.4 90.3  PLT 160 183 273   Basic Metabolic Panel: Recent Labs  Lab 09/12/23 0353 09/13/23 1415 09/17/23 0811  NA 134* 132* 135  K 4.5 4.3 3.7  CL 102 100 102  CO2 24 20* 21*  GLUCOSE 108* 94 91  BUN 42* 44* 44*  CREATININE 0.94 0.87 0.68  CALCIUM 8.4* 8.2* 8.9   GFR: Estimated Creatinine Clearance: 42.4 mL/min (by C-G formula based on SCr of 0.68 mg/dL). Liver Function Tests: No results for input(s): "AST", "ALT", "ALKPHOS", "BILITOT", "PROT", "ALBUMIN" in the last 168 hours. No results for input(s): "LIPASE", "AMYLASE" in the last 168 hours. No results for input(s): "AMMONIA" in the last 168 hours. Coagulation Profile: No results for input(s): "INR", "PROTIME" in the last 168 hours. Cardiac Enzymes: No results for  input(s): "CKTOTAL", "CKMB", "CKMBINDEX", "TROPONINI" in the last 168 hours. BNP (last 3 results) No results for input(s): "PROBNP" in the last 8760 hours. HbA1C: No results for input(s): "HGBA1C" in the last 72 hours. CBG: No results for input(s): "GLUCAP" in the last 168 hours. Lipid Profile: No results for input(s): "CHOL", "HDL", "LDLCALC", "TRIG", "CHOLHDL", "LDLDIRECT" in the last 72 hours. Thyroid Function Tests: No results for input(s): "TSH", "T4TOTAL", "FREET4", "T3FREE", "THYROIDAB" in the last 72 hours. Anemia Panel: No results for input(s):  "VITAMINB12", "FOLATE", "FERRITIN", "TIBC", "IRON", "RETICCTPCT" in the last 72 hours. Sepsis Labs: No results for input(s): "PROCALCITON", "LATICACIDVEN" in the last 168 hours.  Recent Results (from the past 240 hours)  SARS Coronavirus 2 by RT PCR (hospital order, performed in [facility] hospital lab) *cepheid single result test* Anterior Nasal Swab     Status: None   Collection Time: 09/10/23 12:20 PM   Specimen: Anterior Nasal Swab  Result Value Ref Range Status   SARS Coronavirus 2 by RT PCR NEGATIVE NEGATIVE Final    Comment: (NOTE) SARS-CoV-2 target nucleic acids are NOT DETECTED.  The SARS-CoV-2 RNA is generally detectable in upper and lower respiratory specimens during the acute phase of infection. The lowest concentration of SARS-CoV-2 viral copies this assay can detect is 250 copies / mL. A negative result does not preclude SARS-CoV-2 infection and should not be used as the sole basis for treatment or other patient management decisions.  A negative result may occur with improper specimen collection / handling, submission of specimen other than nasopharyngeal swab, presence of viral mutation(s) within the areas targeted by this assay, and inadequate number of viral copies (<250 copies / mL). A negative result must be combined with clinical observations, patient history, and epidemiological information.  Fact Sheet for Patients:   RoadLapTop.co.za  Fact Sheet for Healthcare Providers: http://kim-miller.com/  This test is not yet approved or  cleared by the [place] FDA and has been authorized for detection and/or diagnosis of SARS-CoV-2 by FDA under an Emergency Use Authorization (EUA).  This EUA will remain in effect (meaning this test can be used) for the duration of the COVID-19 declaration under Section 564(b)(1) of the Act, 21 U.S.C. section 360bbb-3(b)(1), unless the authorization is terminated or revoked  sooner.  Performed at [facility], [address] [place], [place] [address]          Radiology Studies: DG Abd 1 View Result Date: 09/17/2023 CLINICAL DATA:  Ileus. EXAM: ABDOMEN - 1 VIEW COMPARISON:  CT 09/13/2023 FINDINGS: Retrocardiac hiatal hernia. Air throughout nondilated small bowel in the central abdomen. Small to moderate colonic stool burden. There is moderate stool in the rectum. Aortic atherosclerosis and peripheral vascular calcifications. Right acetabular protrusio. IMPRESSION: 1. Nonobstructive bowel gas pattern. 2. Small to moderate colonic stool burden. 3. Retrocardiac hiatal hernia. Electronically Signed   By: Narda Rutherford M.D.   On: 09/17/2023 16:53         Scheduled Meds:  aspirin EC  81 mg Oral Daily   benzonatate  200 mg Oral TID   calcium carbonate  1 tablet Oral BID WC   docusate sodium  100 mg Oral BID   enalapril  20 mg Oral Daily   guaiFENesin  600 mg Oral BID   hydrochlorothiazide  12.5 mg Oral Daily   levothyroxine  88 mcg Oral Q0600   metoprolol tartrate  12.5 mg Oral BID   multivitamin with minerals  1 tablet Oral Daily   pantoprazole  40  mg Oral Daily   polyethylene glycol  17 g Oral Daily   predniSONE  20 mg Oral Q breakfast   simethicone  80 mg Oral QID   simvastatin  40 mg Oral q1800   Continuous Infusions:  promethazine (PHENERGAN) injection (IM or IVPB) Stopped (09/02/23 1107)     LOS: 17 days      Tresa Moore, MD [facility]   If 7PM-7AM, please contact night-coverage  09/18/2023, 2:09 PM

## 2023-09-18 NOTE — Progress Notes (Signed)
 Occupational Therapy Treatment Patient Details Name: Kathleen Paul MRN: [id number] DOB: [birth year] Today's Date: 09/18/2023   History of present illness Kathleen Paul is an 87yoF who comes [facility] on 2/[facility] after fall at home. Noted elevation of troponin as well. Foundtohave Acute mildly displaced fracture of the mid right humeral diaphysis; managed non surgically. 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   OT comments  Kathleen Paul was seen for OT treatment on this date. Upon arrival to room pt seated in chair, requesting to return to bed. Pt declines use of Corene Cornea with nursing citing pressure on knees and reports anxiousness re: transfering with RN - agreeable with therapy assistance. Pt requires MOD A x2 + HHA chair>bed step pivot t/f, takes small steps with cueing. MAX A x2 sit>supine. Ice pack provided and reviewed HEP. Pt making good progress toward goals, will continue to follow POC. Discharge recommendation remains appropriate.        If plan is discharge home, recommend the following:  Two people to help with walking and/or transfers;Two people to help with bathing/dressing/bathroom;Help with stairs or ramp for entrance   Equipment Recommendations  BSC/3in1;Wheelchair (measurements OT)    Recommendations for Other Services      Precautions / Restrictions Precautions Precautions: Fall;Shoulder Shoulder Interventions: Shoulder sling/immobilizer Required Braces or Orthoses: Splint/Cast;Sling;Other Brace Splint/Cast: RUE splint in 90 degrees. Noteable R hand swelling Splint/Cast - Date Prophylactic Dressing Applied (if applicable): 09/01/23 Restrictions Weight Bearing Restrictions Per Provider Order: Yes RUE Weight Bearing Per Provider Order: Non weight bearing       Mobility Bed Mobility Overal bed mobility: Needs Assistance Bed Mobility: Sit to Supine       Sit to supine:  Max assist, +2 for physical assistance        Transfers Overall transfer level: Needs assistance Equipment used: 1 person hand held assist Transfers: Bed to chair/wheelchair/BSC       Step pivot transfers: Mod assist, +2 physical assistance           Balance Overall balance assessment: Needs assistance Sitting-balance support: No upper extremity supported, Feet supported Sitting balance-Leahy Scale: Good     Standing balance support: Single extremity supported Standing balance-Leahy Scale: Poor                             ADL either performed or assessed with clinical judgement   ADL Overall ADL's : Needs assistance/impaired                                       General ADL Comments: MAX A pericare at bed level     Communication Communication Factors Affecting Communication: Hearing impaired   Cognition Arousal: Alert Behavior During Therapy: WFL for tasks assessed/performed Cognition: No apparent impairments                               Following commands: Intact        Cueing   Cueing Techniques: Verbal cues, Tactile cues             Pertinent Vitals/ Pain       Pain Assessment Pain Assessment: Faces Faces Pain Scale: Hurts little more Pain Location: R UE Pain Descriptors /  Indicators: Sore Pain Intervention(s): Limited activity within patient's tolerance   Frequency  Min 2X/week        Progress Toward Goals  OT Goals(current goals can now be found in the care plan section)  Progress towards OT goals: Progressing toward goals  Acute Rehab OT Goals OT Goal Formulation: With patient Time For Goal Achievement: 10/01/23 Potential to Achieve Goals: Good ADL Goals Pt Will Perform Grooming: with min assist;sitting Pt Will Perform Lower Body Dressing: with min assist;sit to/from stand;with caregiver independent in assisting Pt Will Transfer to Toilet: with min assist;stand pivot transfer;bedside  commode  Plan      Co-evaluation                 AM-PAC OT "6 Clicks" Daily Activity     Outcome Measure   Help from another person eating meals?: None Help from another person taking care of personal grooming?: A Little Help from another person toileting, which includes using toliet, bedpan, or urinal?: A Lot Help from another person bathing (including washing, rinsing, drying)?: A Lot Help from another person to put on and taking off regular upper body clothing?: A Little Help from another person to put on and taking off regular lower body clothing?: A Lot 6 Click Score: 16    End of Session Equipment Utilized During Treatment: Gait belt  OT Visit Diagnosis: Unsteadiness on feet (R26.81)   Activity Tolerance Patient tolerated treatment well   Patient Left in bed;with call bell/phone within reach;with nursing/sitter in room;with family/visitor present   Nurse Communication          Time: 9518-8416 OT Time Calculation (min): 8 min  Charges: OT General Charges $OT Visit: 1 Visit OT Treatments $Therapeutic Activity: 8-22 mins  [name], M.S. OTR/L  09/18/23, 3:14 PM  ascom [phone]

## 2023-09-19 ENCOUNTER — Other Ambulatory Visit: Payer: Self-pay

## 2023-09-19 DIAGNOSIS — N183 Chronic kidney disease, stage 3 unspecified: Secondary | ICD-10-CM | POA: Diagnosis not present

## 2023-09-19 DIAGNOSIS — M19011 Primary osteoarthritis, right shoulder: Secondary | ICD-10-CM | POA: Diagnosis not present

## 2023-09-19 DIAGNOSIS — J189 Pneumonia, unspecified organism: Secondary | ICD-10-CM | POA: Diagnosis not present

## 2023-09-19 DIAGNOSIS — E871 Hypo-osmolality and hyponatremia: Secondary | ICD-10-CM | POA: Diagnosis not present

## 2023-09-19 DIAGNOSIS — Z743 Need for continuous supervision: Secondary | ICD-10-CM | POA: Diagnosis not present

## 2023-09-19 DIAGNOSIS — I2510 Atherosclerotic heart disease of native coronary artery without angina pectoris: Secondary | ICD-10-CM | POA: Diagnosis not present

## 2023-09-19 DIAGNOSIS — D62 Acute posthemorrhagic anemia: Secondary | ICD-10-CM | POA: Diagnosis not present

## 2023-09-19 DIAGNOSIS — E785 Hyperlipidemia, unspecified: Secondary | ICD-10-CM | POA: Diagnosis not present

## 2023-09-19 DIAGNOSIS — S40021A Contusion of right upper arm, initial encounter: Secondary | ICD-10-CM | POA: Diagnosis not present

## 2023-09-19 DIAGNOSIS — R2689 Other abnormalities of gait and mobility: Secondary | ICD-10-CM | POA: Diagnosis not present

## 2023-09-19 DIAGNOSIS — R110 Nausea: Secondary | ICD-10-CM | POA: Diagnosis not present

## 2023-09-19 DIAGNOSIS — K219 Gastro-esophageal reflux disease without esophagitis: Secondary | ICD-10-CM | POA: Diagnosis not present

## 2023-09-19 DIAGNOSIS — I82601 Acute embolism and thrombosis of unspecified veins of right upper extremity: Secondary | ICD-10-CM | POA: Diagnosis not present

## 2023-09-19 DIAGNOSIS — M79641 Pain in right hand: Secondary | ICD-10-CM | POA: Diagnosis not present

## 2023-09-19 DIAGNOSIS — M25431 Effusion, right wrist: Secondary | ICD-10-CM | POA: Diagnosis not present

## 2023-09-19 DIAGNOSIS — I82621 Acute embolism and thrombosis of deep veins of right upper extremity: Secondary | ICD-10-CM | POA: Diagnosis not present

## 2023-09-19 DIAGNOSIS — K222 Esophageal obstruction: Secondary | ICD-10-CM | POA: Diagnosis not present

## 2023-09-19 DIAGNOSIS — D649 Anemia, unspecified: Secondary | ICD-10-CM | POA: Diagnosis not present

## 2023-09-19 DIAGNOSIS — S42301D Unspecified fracture of shaft of humerus, right arm, subsequent encounter for fracture with routine healing: Secondary | ICD-10-CM | POA: Diagnosis not present

## 2023-09-19 DIAGNOSIS — M6259 Muscle wasting and atrophy, not elsewhere classified, multiple sites: Secondary | ICD-10-CM | POA: Diagnosis not present

## 2023-09-19 DIAGNOSIS — F419 Anxiety disorder, unspecified: Secondary | ICD-10-CM | POA: Diagnosis not present

## 2023-09-19 DIAGNOSIS — M25511 Pain in right shoulder: Secondary | ICD-10-CM | POA: Diagnosis not present

## 2023-09-19 DIAGNOSIS — M159 Polyosteoarthritis, unspecified: Secondary | ICD-10-CM | POA: Diagnosis not present

## 2023-09-19 DIAGNOSIS — M6281 Muscle weakness (generalized): Secondary | ICD-10-CM | POA: Diagnosis not present

## 2023-09-19 DIAGNOSIS — I471 Supraventricular tachycardia, unspecified: Secondary | ICD-10-CM | POA: Diagnosis not present

## 2023-09-19 DIAGNOSIS — Z01810 Encounter for preprocedural cardiovascular examination: Secondary | ICD-10-CM | POA: Diagnosis not present

## 2023-09-19 DIAGNOSIS — I10 Essential (primary) hypertension: Secondary | ICD-10-CM | POA: Diagnosis not present

## 2023-09-19 DIAGNOSIS — S42301A Unspecified fracture of shaft of humerus, right arm, initial encounter for closed fracture: Secondary | ICD-10-CM | POA: Diagnosis not present

## 2023-09-19 DIAGNOSIS — R278 Other lack of coordination: Secondary | ICD-10-CM | POA: Diagnosis not present

## 2023-09-19 DIAGNOSIS — I959 Hypotension, unspecified: Secondary | ICD-10-CM | POA: Diagnosis not present

## 2023-09-19 DIAGNOSIS — S42341A Displaced spiral fracture of shaft of humerus, right arm, initial encounter for closed fracture: Secondary | ICD-10-CM | POA: Diagnosis not present

## 2023-09-19 DIAGNOSIS — F411 Generalized anxiety disorder: Secondary | ICD-10-CM | POA: Diagnosis not present

## 2023-09-19 DIAGNOSIS — Z9181 History of falling: Secondary | ICD-10-CM | POA: Diagnosis not present

## 2023-09-19 DIAGNOSIS — M25531 Pain in right wrist: Secondary | ICD-10-CM | POA: Diagnosis not present

## 2023-09-19 DIAGNOSIS — R41841 Cognitive communication deficit: Secondary | ICD-10-CM | POA: Diagnosis not present

## 2023-09-19 DIAGNOSIS — N179 Acute kidney failure, unspecified: Secondary | ICD-10-CM | POA: Diagnosis not present

## 2023-09-19 DIAGNOSIS — M13831 Other specified arthritis, right wrist: Secondary | ICD-10-CM | POA: Diagnosis not present

## 2023-09-19 MED ORDER — BENZONATATE 200 MG PO CAPS
200.0000 mg | ORAL_CAPSULE | Freq: Three times a day (TID) | ORAL | 0 refills | Status: AC
  Filled 2023-09-19: qty 20, 7d supply, fill #0

## 2023-09-19 MED ORDER — DEXTROMETHORPHAN-GUAIFENESIN 20-200 MG/20ML PO LIQD
15.0000 mL | ORAL | 0 refills | Status: DC | PRN
  Filled 2023-09-19: qty 118, 1d supply, fill #0

## 2023-09-19 MED ORDER — PREDNISONE 20 MG PO TABS
20.0000 mg | ORAL_TABLET | Freq: Every day | ORAL | 0 refills | Status: AC
  Filled 2023-09-19: qty 3, 3d supply, fill #0

## 2023-09-19 MED ORDER — GUAIFENESIN ER 600 MG PO TB12
600.0000 mg | ORAL_TABLET | Freq: Two times a day (BID) | ORAL | 0 refills | Status: AC
  Filled 2023-09-19: qty 14, 7d supply, fill #0

## 2023-09-19 MED ORDER — HYDROCHLOROTHIAZIDE 12.5 MG PO TABS
12.5000 mg | ORAL_TABLET | Freq: Every day | ORAL | 0 refills | Status: DC
  Filled 2023-09-19: qty 30, 30d supply, fill #0

## 2023-09-19 MED ORDER — NITROGLYCERIN 0.4 MG SL SUBL
0.4000 mg | SUBLINGUAL_TABLET | SUBLINGUAL | 0 refills | Status: AC | PRN
  Filled 2023-09-19: qty 25, 8d supply, fill #0

## 2023-09-19 MED ORDER — HYDROCODONE-ACETAMINOPHEN 5-325 MG PO TABS: 1.0000 | ORAL_TABLET | Freq: Three times a day (TID) | ORAL | 0 refills | Status: AC | PRN

## 2023-09-19 MED ORDER — METOPROLOL TARTRATE 25 MG PO TABS
12.5000 mg | ORAL_TABLET | Freq: Two times a day (BID) | ORAL | 0 refills | Status: DC
  Filled 2023-09-19: qty 30, 30d supply, fill #0

## 2023-09-19 NOTE — TOC Progression Note (Signed)
 Transition of Care [facility]) - Progression Note    Patient Details  Name: Kathleen Paul MRN: [id number] Date of Birth: [birth year]  Transition [facility] CM/SW Contact  [name], RN Phone Number: 09/19/2023, 12:46 PM  Clinical Narrative:     Ins approved to [facility] to [facility]  [id number] Ems approved    Ambulance authorization  [phone] and is good though discharge from this admission.     Expected Discharge Plan and Services                                               Social Determinants of Health (SDOH) Interventions SDOH Screenings   Food Insecurity: No Food Insecurity (09/02/2023)  Housing: Low Risk  (09/02/2023)  Transportation Needs: No Transportation Needs (09/02/2023)  Utilities: Not At Risk (09/02/2023)  Alcohol Screen: Low Risk  (12/18/2022)  Depression (PHQ2-9): Low Risk  (12/18/2022)  Financial Resource Strain: Low Risk  (12/18/2022)  Physical Activity: Inactive (12/18/2022)  Social Connections: Socially Isolated (09/02/2023)  Stress: No Stress Concern Present (12/18/2022)  Tobacco Use: Medium Risk (09/01/2023)    Readmission Risk Interventions     No data to display

## 2023-09-19 NOTE — Discharge Summary (Signed)
 Physician Discharge Summary   Patient: Kathleen Paul MRN: [id number] DOB: 1936-05-03  Admit date:     09/01/2023  Discharge date: 09/19/23  Discharge Physician: Delfino Lovett   PCP: Doreene Nest, NP   Recommendations at discharge:    F/up with outpt providers as requested  Discharge Diagnoses: Principal Problem:   Acute blood loss anemia (ABLA) Active Problems:   Elevated troponins due to demand ischemia   Fall   Closed right humeral fracture   AKI (acute kidney injury) (HCC)   Hyponatremia   Essential hypertension   Hyperkalemia   Lactic acidosis   Hypothyroidism   Dyslipidemia   Generalized weakness   GERD without esophagitis   PSVT (paroxysmal supraventricular tachycardia) [facility])  Hospital Course: Assessment and Plan:  87yo with h/o anxiety, gastric ulcer, HLD, DVT, HTN, and CAD who presented on 2/22 with a mechanical fall.  She was found to have a R humerus fracture and orthopedics was consulted.  She was also noted to have elevated troponin (25 -> 410 -> 1431, now downtrending) and cardiology was consulted, started heparin.  Developed ABLA with CT showing concern for humerus bleeding and heparin was stopped.  Transfused 3 units PRBC.  Echo without WMA, cardiology recommended conservative management.     Mechanical fall Patient presented with mechanical fall that resulted in humerus fracture (nonoperative) She has had generalized weakness and being D/C to SNF for rehab.     Elevated troponin No chest pain  Troponin peaked at 1431 Likely related to demand ischemia Cardiology recommended conservative management and outpt f/up Continue aspirin, metoprolol, simvastatin   Concern for ileus, ruled OUT Patient having regular BMs NO EVIDENCE OF ILEUS Medically appropriate for discharge to SNF   Acute blood loss anemia  Patient received 3 units total of packed red blood cells during the hospital course last being on 2/27 Patient does have bruising on the right  arm CT scan showing hematoma right arm Hemoglobin stable at 11.0 as of 3/10.  No indication for transfusion.   Stage 3a CKD Appears stable at this time   Closed right spiral humeral fracture Orthopedic surgery recommending conservative management PT recommending hemiwalker and breg sling Taking Os-Cal for presumed associated osteoporosis Continue to recommend skilled nursing facility as next level of care   Essential hypertension Continue metoprolol & enalapril    LLL PNA COVID negative CXR with atelectasis vs. LLL infiltrate Rocephin and azithromycin.  Course completed  Hyponatremia Improved   Hypothyroidism Continue Synthroid   Dyslipidemia LDL 65 Continue simvastatin   PSVT (paroxysmal supraventricular tachycardia) Metoprolol tartrate 12.5 twice daily    GERD without esophagitis Continue PPI therapy   Headache Described on top of the head.  Pounding in nature.  Concerning for migraine.  Trial Fioricet.,  Successful.  Headache resolved   DNR Confirmed on admission [facility] documents reviewed      Consultants: Cardio, Ortho Disposition: Skilled nursing facility Diet recommendation:  Discharge Diet Orders (From admission, onward)     Start     Ordered   09/19/23 0000  Diet - low sodium heart healthy        09/19/23 1250           Carb modified diet DISCHARGE MEDICATION: Allergies as of 09/19/2023       Reactions   Codeine Nausea Only        Medication List     STOP taking these medications    lidocaine 5 % Commonly known as: LIDODERM   traMADol 50  MG tablet Commonly known as: ULTRAM   Zinc Oxide 12.8 % ointment Commonly known as: TRIPLE PASTE       TAKE these medications    acetaminophen 325 MG tablet Commonly known as: TYLENOL Take 650 mg by mouth every 6 (six) hours as needed.   alum & mag hydroxide-simeth 400-400-40 MG/5ML suspension Commonly known as: MAALOX PLUS Take 5 mLs by mouth every 4 (four) hours as needed for  indigestion.   aspirin EC 81 MG tablet Take 1 tablet (81 mg total) by mouth daily. Swallow whole. Aspirin & plavix for 21 days, f/by aspirin alone   benzonatate 200 MG capsule Commonly known as: TESSALON Take 1 capsule (200 mg total) by mouth 3 (three) times daily.   bisacodyl 10 MG suppository Commonly known as: DULCOLAX Place 10 mg rectally as needed for moderate constipation.   CALCIUM 1200+D3 PO Take 1 tablet by mouth daily.   Dextromethorphan-guaiFENesin 20-200 MG/20ML Liqd Take 22.5 mLs by mouth every 4 (four) hours as needed.   enalapril 20 MG tablet Commonly known as: VASOTEC Take 1 tablet (20 mg total) by mouth daily. for blood pressure.   guaiFENesin 600 MG 12 hr tablet Commonly known as: MUCINEX Take 1 tablet (600 mg total) by mouth 2 (two) times daily for 7 days.   hydrochlorothiazide 12.5 MG tablet Commonly known as: HYDRODIURIL Take 1 tablet (12.5 mg total) by mouth daily. Start taking on: September 20, 2023   HYDROcodone-acetaminophen 5-325 MG tablet Commonly known as: NORCO/VICODIN Take 1 tablet by mouth every 8 (eight) hours as needed for up to 3 days for moderate pain (pain score 4-6) or severe pain (pain score 7-10).   levothyroxine 88 MCG tablet Commonly known as: SYNTHROID Take 1 tablet by mouth every morning on an empty stomach with water only.  No food or other medications for 30 minutes.   metoprolol tartrate 25 MG tablet Commonly known as: LOPRESSOR Take 0.5 tablets (12.5 mg total) by mouth 2 (two) times daily.   MULTIVITAMIN PO Take 1 tablet by mouth daily.   nitroGLYCERIN 0.4 MG SL tablet Commonly known as: NITROSTAT Place 1 tablet (0.4 mg total) under the tongue every 5 (five) minutes x 3 doses as needed for up to 10 days for chest pain.   omeprazole 40 MG capsule Commonly known as: PRILOSEC Take 1 capsule (40 mg total) by mouth daily. for heartburn.   polyethylene glycol 17 g packet Commonly known as: MIRALAX / GLYCOLAX Take 17 g by  mouth daily as needed for mild constipation.   predniSONE 20 MG tablet Commonly known as: DELTASONE Take 1 tablet (20 mg total) by mouth daily with breakfast for 3 days. Start taking on: September 20, 2023   simvastatin 20 MG tablet Commonly known as: ZOCOR Take 1 tablet (20 mg total) by mouth every evening. for cholesterol.        Contact information for follow-up providers     Deeann Saint, MD. Schedule an appointment as soon as possible for a visit in 1 week(s).   Specialty: Orthopedic Surgery Why: For re-evaluation  Please make an appointment for her in my office in 3 to 4 days after discharge from the hospital. Contact information: [address] [place] [address] [phone]         Doreene Nest, NP. Schedule an appointment as soon as possible for a visit in 1 week(s).   Specialty: Internal Medicine Why: [facility] Discharge F/UP Contact information: [address] [place]  [address] [phone]              Contact information for after-discharge care     Destination     [facility] Preferred SNF .   Service: Skilled Nursing Contact information: [address] [place] [address] [phone]                    Discharge Exam: Ceasar Mons Weights   09/01/23 1751  Weight: 62.6 kg   General exam: No acute distress Respiratory system: Coarse bibasilar crackles.  Normal work of breathing.  Room air Cardiovascular system: S1-S2, RRR, no murmurs, no pedal edema Gastrointestinal system: Soft, NT/ND, normal bowel sounds Central nervous system: Alert and oriented. No focal neurological deficits. Extremities: Decreased power bilateral lower extremities. RUE in sling. moderate swelling in the forearm and hand which is not unusual. There is ecchymosis in the upper arm as expected. Elbow motion is satisfactory. The skin is intact  Skin: No rashes, lesions or ulcers Psychiatry:  Judgement and insight appear normal. Mood & affect appropriate.  Condition at discharge: fair  The results of significant diagnostics from this hospitalization (including imaging, microbiology, ancillary and laboratory) are listed below for reference.   Imaging Studies: DG Abd 1 View Result Date: 09/17/2023 CLINICAL DATA:  Ileus. EXAM: ABDOMEN - 1 VIEW COMPARISON:  CT 09/13/2023 FINDINGS: Retrocardiac hiatal hernia. Air throughout nondilated small bowel in the central abdomen. Small to moderate colonic stool burden. There is moderate stool in the rectum. Aortic atherosclerosis and peripheral vascular calcifications. Right acetabular protrusio. IMPRESSION: 1. Nonobstructive bowel gas pattern. 2. Small to moderate colonic stool burden. 3. Retrocardiac hiatal hernia. Electronically Signed   By: Narda Rutherford M.D.   On: 09/17/2023 16:53   CT ABDOMEN PELVIS WO CONTRAST Result Date: 09/13/2023 CLINICAL DATA:  Left lower quadrant pain EXAM: CT ABDOMEN AND PELVIS WITHOUT CONTRAST TECHNIQUE: Multidetector CT imaging of the abdomen and pelvis was performed following the standard protocol without IV contrast. RADIATION DOSE REDUCTION: This exam was performed according to the departmental dose-optimization program which includes automated exposure control, adjustment of the mA and/or kV according to patient size and/or use of iterative reconstruction technique. COMPARISON:  CT 09/02/2023, 10/20/2022 FINDINGS: Lower chest: Lung bases demonstrate large hiatal hernia. Interval small bilateral effusions and bibasilar airspace disease. Cardiomegaly. Hepatobiliary: No focal liver abnormality is seen. No gallstones, gallbladder wall thickening, or biliary dilatation. Pancreas: Unremarkable. No pancreatic ductal dilatation or surrounding inflammatory changes. Spleen: Normal in size without focal abnormality. Adrenals/Urinary Tract: Adrenal glands are within normal limits. Kidneys show no hydronephrosis. The bladder is  unremarkable Stomach/Bowel: Stomach nonenlarged. No dilated small bowel. No acute bowel wall thickening. Redundant sigmoid colon with diffuse diverticular disease Vascular/Lymphatic: Aortic atherosclerosis. No enlarged abdominal or pelvic lymph nodes. Reproductive: Status post hysterectomy. No adnexal masses. Other: Negative for pelvic effusion or free air. Left inguinal hernia containing fat and small bowel but no incarceration or obstruction Musculoskeletal: Scoliosis and degenerative changes of the spine. Protrusion deformity of the right hip with advanced degenerative changes. Increased soft tissue thickening of the joint space with presence of small hip effusion. Since the prior exams, decreased fluid along the inter margin of right acetabulum. IMPRESSION: 1. Small left inguinal hernia containing fat and small bowel but no obstruction or incarceration. Diverticular disease of left colon without acute inflammation 2. Interval small pleural effusions and basilar consolidations which may reflect atelectasis or pneumonia 3. Slightly progressive protrusio deformity of the right hip with diffuse suspected  synovial thickening and small hip effusion but overall decreased soft tissue changes compared to the prior exams. Findings could be secondary to inflammatory arthropathy versus sequela of prior infection. Follow-up MRI may be obtained if there are symptoms referable to the right hip suggesting active disease. Electronically Signed   By: Jasmine Pang M.D.   On: 09/13/2023 18:12   DG Chest Port 1 View Result Date: 09/10/2023 CLINICAL DATA:  Cough. EXAM: PORTABLE CHEST 1 VIEW COMPARISON:  CT chest and chest x-ray dated October 20, 2022. FINDINGS: Stable cardiomediastinal silhouette with large hiatal hernia. Hazy density at the left lung base with possible small left pleural effusion. Clear right lung. No pneumothorax. No acute osseous abnormality. IMPRESSION: 1. Atelectasis versus pneumonia at the left lung base with  possible small left pleural effusion. 2. Unchanged large hiatal hernia. Electronically Signed   By: Obie Dredge M.D.   On: 09/10/2023 13:12   CT HUMERUS RIGHT WO CONTRAST Result Date: 09/04/2023 CLINICAL DATA:  Upper arm trauma still dropping Hgb, any active bleeding EXAM: CT OF THE RIGHT HUMERUS WITHOUT CONTRAST TECHNIQUE: Multidetector CT imaging was performed according to the standard protocol. Multiplanar CT image reconstructions were also generated. RADIATION DOSE REDUCTION: This exam was performed according to the departmental dose-optimization program which includes automated exposure control, adjustment of the mA and/or kV according to patient size and/or use of iterative reconstruction technique. COMPARISON:  09/02/2023 FINDINGS: Bones/Joint/Cartilage Similar appearance of displaced humeral diaphyseal fracture. Slight decrease in the degree of angulation although fracture remains laterally displaced. Chronic arthropathy of the right shoulder. Intact elbow joint. No new fractures. Ligaments Suboptimally assessed by CT. Muscles and Tendons Similar fullness of the upper arm musculature, most notably the triceps muscle adjacent to the fracture. No well-defined intramuscular hematoma is evident. Soft tissues Degree of soft tissue swelling has progressed compared to prior. Fluid accumulation within the posterior soft tissues overlying the olecranon process of the elbow has also progressed, which could be related to dependent edema as internal density is that of simple fluid. No organized hyperdense collections. IMPRESSION: 1. Similar appearance of displaced humeral diaphyseal fracture. Slight decrease in the degree of angulation although fracture remains laterally displaced. 2. Similar fullness of the upper arm musculature, most notably the triceps muscle adjacent to the fracture. No well-defined intramuscular hematoma is evident. 3. Degree of soft tissue swelling has progressed compared to prior. Fluid  accumulation within the posterior soft tissues overlying the olecranon process of the elbow has also progressed, which could be related to dependent edema as internal density is that of simple fluid. No organized hematoma. Electronically Signed   By: Duanne Guess D.O.   On: 09/04/2023 16:03   ECHOCARDIOGRAM COMPLETE Result Date: 09/03/2023    ECHOCARDIOGRAM REPORT   Patient Name:   URA YINGLING Date of Exam: 09/02/2023 Medical Rec #:  [id number]       Height:       60.0 in Accession #:    [id number]      Weight:       138.0 lb Date of Birth:  24-Feb-1936        BSA:          1.594 m Patient Age:    87 years        BP:           158/123 mmHg Patient Gender: F               HR:  102 bpm. Exam Location:  [facility] Procedure: 2D Echo, Cardiac Doppler and Color Doppler (Both Spectral and Color            Flow Doppler were utilized during procedure). Indications:     NSTEMI I21.4  History:         Patient has prior history of Echocardiogram examinations, most                  recent 06/12/2022. TIA. Anxiety.  Sonographer:     Cristela Blue Referring Phys:  [id number] JAN A MANSY Diagnosing Phys: Lorine Bears MD IMPRESSIONS  1. Left ventricular ejection fraction, by estimation, is 55 to 60%. The left ventricle has normal function. The left ventricle has no regional wall motion abnormalities. Left ventricular diastolic parameters are consistent with Grade I diastolic dysfunction (impaired relaxation).  2. Right ventricular systolic function is normal. The right ventricular size is normal. Tricuspid regurgitation signal is inadequate for assessing PA pressure.  3. The mitral valve is normal in structure. No evidence of mitral valve regurgitation. No evidence of mitral stenosis.  4. The aortic valve is normal in structure. Aortic valve regurgitation is trivial. Aortic valve sclerosis/calcification is present, without any evidence of aortic stenosis. FINDINGS  Left Ventricle: Left ventricular ejection fraction, by  estimation, is 55 to 60%. The left ventricle has normal function. The left ventricle has no regional wall motion abnormalities. Strain imaging was not performed. The left ventricular internal cavity  size was normal in size. There is borderline left ventricular hypertrophy. Left ventricular diastolic parameters are consistent with Grade I diastolic dysfunction (impaired relaxation). Right Ventricle: The right ventricular size is normal. No increase in right ventricular wall thickness. Right ventricular systolic function is normal. Tricuspid regurgitation signal is inadequate for assessing PA pressure. Left Atrium: Left atrial size was normal in size. Right Atrium: Right atrial size was normal in size. Pericardium: There is no evidence of pericardial effusion. Mitral Valve: The mitral valve is normal in structure. No evidence of mitral valve regurgitation. No evidence of mitral valve stenosis. MV peak gradient, 4.2 mmHg. The mean mitral valve gradient is 2.0 mmHg. Tricuspid Valve: The tricuspid valve is normal in structure. Tricuspid valve regurgitation is not demonstrated. No evidence of tricuspid stenosis. Aortic Valve: The aortic valve is normal in structure. Aortic valve regurgitation is trivial. Aortic valve sclerosis/calcification is present, without any evidence of aortic stenosis. Aortic valve mean gradient measures 3.5 mmHg. Aortic valve peak gradient measures 5.8 mmHg. Aortic valve area, by VTI measures 3.84 cm. Pulmonic Valve: The pulmonic valve was normal in structure. Pulmonic valve regurgitation is not visualized. No evidence of pulmonic stenosis. Aorta: The aortic root is normal in size and structure. Venous: The inferior vena cava was not well visualized. IAS/Shunts: No atrial level shunt detected by color flow Doppler. Additional Comments: 3D imaging was not performed.  LEFT VENTRICLE PLAX 2D LVIDd:         3.20 cm   Diastology LVIDs:         2.20 cm   LV e' medial:    4.57 cm/s LV PW:         1.00  cm   LV E/e' medial:  14.4 LV IVS:        1.30 cm   LV e' lateral:   5.98 cm/s LVOT diam:     2.00 cm   LV E/e' lateral: 11.0 LV SV:         65 LV SV Index:   41 LVOT  Area:     3.14 cm  RIGHT VENTRICLE RV Basal diam:  2.70 cm RV Mid diam:    1.90 cm RV S prime:     22.20 cm/s TAPSE (M-mode): 2.4 cm LEFT ATRIUM           Index       RIGHT ATRIUM           Index LA diam:      2.90 cm 1.82 cm/m  RA Area:     14.80 cm LA Vol (A2C): 4.1 ml  2.56 ml/m  RA Volume:   37.90 ml  23.77 ml/m LA Vol (A4C): 4.9 ml  3.05 ml/m  AORTIC VALVE AV Area (Vmax):    3.23 cm AV Area (Vmean):   3.33 cm AV Area (VTI):     3.84 cm AV Vmax:           120.50 cm/s AV Vmean:          87.350 cm/s AV VTI:            0.170 m AV Peak Grad:      5.8 mmHg AV Mean Grad:      3.5 mmHg LVOT Vmax:         124.00 cm/s LVOT Vmean:        92.500 cm/s LVOT VTI:          0.207 m LVOT/AV VTI ratio: 1.22  AORTA Ao Root diam: 3.00 cm MITRAL VALVE               TRICUSPID VALVE MV Area (PHT): 2.81 cm    TR Peak grad:   18.1 mmHg MV Area VTI:   2.65 cm    TR Vmax:        213.00 cm/s MV Peak grad:  4.2 mmHg MV Mean grad:  2.0 mmHg    SHUNTS MV Vmax:       1.03 m/s    Systemic VTI:  0.21 m MV Vmean:      74.8 cm/s   Systemic Diam: 2.00 cm MV Decel Time: 270 msec MV E velocity: 66.00 cm/s MV A velocity: 82.80 cm/s MV E/A ratio:  0.80 Lorine Bears MD Electronically signed by Lorine Bears MD Signature Date/Time: 09/03/2023/5:23:11 PM    Final    US RENAL Result Date: 09/03/2023 CLINICAL DATA:  Acute kidney injury. EXAM: RENAL / URINARY TRACT ULTRASOUND COMPLETE COMPARISON:  Abdomen and pelvis CT dated 09/02/2023 FINDINGS: Right Kidney: Renal measurements: 8.1 x 4.7 x 4.2 cm = volume: 83 mL. Echogenicity within normal limits. No mass or hydronephrosis visualized. Left Kidney: Renal measurements: 8.1 x 4.6 x 3.4 cm = volume: 66 mL. 1.1 cm lower pole cyst with minimal internal echoes, most likely due to body habitus. This measured 11 Hounsfield units on the  previous CT. This does not need imaging follow-up. Normal renal echotexture. No hydronephrosis. Bladder: Appears normal for degree of bladder distention. Other: None. IMPRESSION: No significant abnormality. Electronically Signed   By: Beckie Salts M.D.   On: 09/03/2023 15:28   CT HUMERUS RIGHT WO CONTRAST Result Date: 09/02/2023 CLINICAL DATA:  Right humeral fracture.  Decreasing hemoglobin EXAM: CT OF THE RIGHT HUMERUS WITHOUT CONTRAST TECHNIQUE: Multidetector CT imaging was performed according to the standard protocol. Multiplanar CT image reconstructions were also generated. RADIATION DOSE REDUCTION: This exam was performed according to the departmental dose-optimization program which includes automated exposure control, adjustment of the mA and/or kV according to patient size and/or use of iterative reconstruction  technique. COMPARISON:  X-ray 09/01/2023 FINDINGS: Bones/Joint/Cartilage Acute obliquely oriented fracture through the mid right humeral diaphysis. There is lateral displacement and varus angulation at the fracture site as well as mild shortening. No evidence of an underlying lytic or sclerotic bone lesion to suggest pathologic fracture at this location. 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 Elbow joint within normal limits. No additional fractures. Ligaments Suboptimally assessed by CT. Muscles and Tendons There is some fullness within the upper arm musculature adjacent to the fracture site, particularly within the adjacent triceps muscle which could represent a component of posttraumatic intramuscular swelling or hemorrhage. No well-defined intramuscular hematoma. Atrophy of the rotator cuff musculature. Soft tissues Soft tissue swelling and ill-defined hemorrhage of the upper arm. No organized hematoma. Partially seen large hiatal hernia. IMPRESSION: 1. Acute mildly displaced and angulated fracture of the mid right humeral diaphysis. 2. Soft tissue  swelling and ill-defined hemorrhage of the upper arm. No organized hematoma. 3. 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 4. Partially seen large hiatal hernia. Electronically Signed   By: Duanne Guess D.O.   On: 09/02/2023 13:59   CT ABDOMEN PELVIS WO CONTRAST Result Date: 09/02/2023 CLINICAL DATA:  Acute generalized abdominal pain after fall. EXAM: CT ABDOMEN AND PELVIS WITHOUT CONTRAST TECHNIQUE: Multidetector CT imaging of the abdomen and pelvis was performed following the standard protocol without IV contrast. RADIATION DOSE REDUCTION: This exam was performed according to the departmental dose-optimization program which includes automated exposure control, adjustment of the mA and/or kV according to patient size and/or use of iterative reconstruction technique. COMPARISON:  October 20, 2022. FINDINGS: Lower chest: Visualized lung bases are unremarkable. Large hiatal hernia is noted. Hepatobiliary: No focal liver abnormality is seen. No gallstones, gallbladder wall thickening, or biliary dilatation. Pancreas: Unremarkable. No pancreatic ductal dilatation or surrounding inflammatory changes. Spleen: Normal in size without focal abnormality. Adrenals/Urinary Tract: Adrenal glands are unremarkable. Kidneys are normal, without renal calculi, focal lesion, or hydronephrosis. Bladder is unremarkable. Stomach/Bowel: No definite evidence of bowel obstruction or inflammation. Sigmoid diverticulosis without inflammation. The appendix is not clearly visualized. Vascular/Lymphatic: Aortic atherosclerosis. No enlarged abdominal or pelvic lymph nodes. Reproductive: Status post hysterectomy. No adnexal masses. Other: No ascites is noted. Small fat containing left inguinal hernia. Musculoskeletal: Severe degenerative changes seen involving the right hip joint as noted on prior exam. No acute osseous abnormality is noted. Moderate dextroscoliosis of lumbar spine is noted.  IMPRESSION: Large sliding-type hiatal hernia is again noted. Sigmoid diverticulosis without inflammation. No acute abnormality seen in the abdomen or pelvis. Severe degenerative changes are noted involving the right hip joint as noted on prior exam. Aortic Atherosclerosis (ICD10-I70.0). Electronically Signed   By: Lupita Raider M.D.   On: 09/02/2023 13:53   CT Cervical Spine Wo Contrast Result Date: 09/01/2023 CLINICAL DATA:  Neck trauma, fall EXAM: CT CERVICAL SPINE WITHOUT CONTRAST TECHNIQUE: Multidetector CT imaging of the cervical spine was performed without intravenous contrast. Multiplanar CT image reconstructions were also generated. RADIATION DOSE REDUCTION: This exam was performed according to the departmental dose-optimization program which includes automated exposure control, adjustment of the mA and/or kV according to patient size and/or use of iterative reconstruction technique. COMPARISON:  Plain films 10/17/2016 FINDINGS: Alignment: No subluxation Skull base and vertebrae: No acute fracture. No primary bone lesion or focal pathologic process. Soft tissues and spinal canal: No prevertebral fluid or swelling. No visible canal hematoma. Disc levels: Diffuse advanced degenerative disc disease and facet disease.  Upper chest: No acute findings Other: None IMPRESSION: Advanced degenerative disc and facet disease diffusely. No acute bony abnormality. Electronically Signed   By: Charlett Nose M.D.   On: 09/01/2023 19:16   CT Head Wo Contrast Result Date: 09/01/2023 CLINICAL DATA:  Head trauma, minor (Age >= 65y).  Fall. EXAM: CT HEAD WITHOUT CONTRAST TECHNIQUE: Contiguous axial images were obtained from the base of the skull through the vertex without intravenous contrast. RADIATION DOSE REDUCTION: This exam was performed according to the departmental dose-optimization program which includes automated exposure control, adjustment of the mA and/or kV according to patient size and/or use of iterative  reconstruction technique. COMPARISON:  06/11/2022 FINDINGS: Brain: No acute intracranial abnormality. Specifically, no hemorrhage, hydrocephalus, mass lesion, acute infarction, or significant intracranial injury. Vascular: No hyperdense vessel or unexpected calcification. Skull: No acute calvarial abnormality. Sinuses/Orbits: No acute findings Other: None IMPRESSION: No acute intracranial abnormality. Electronically Signed   By: Charlett Nose M.D.   On: 09/01/2023 19:14   DG Humerus Right Result Date: 09/01/2023 CLINICAL DATA:  right upper arm pain after fall EXAM: RIGHT HUMERUS - 2+ VIEW COMPARISON:  X-ray 11/28/2017 FINDINGS: Acute obliquely oriented fracture through the mid right humeral diaphysis with approximately 1 cm of lateral displacement and mild shortening. A subtle nondisplaced fracture component is seen extending into the proximal diaphysis. Chronic severe arthropathy of the shoulder with marked osseous remodeling of the glenohumeral joint. High-riding humeral head with progressive osseous remodeling of the distal clavicle and acromion. Generalized soft tissue prominence of the upper arm. IMPRESSION: 1. Acute mildly displaced fracture of the mid right humeral diaphysis. 2. 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 Electronically Signed   By: Duanne Guess D.O.   On: 09/01/2023 18:45    Microbiology: Results for orders placed or performed during the hospital encounter of 09/01/23  Resp panel by RT-PCR (RSV, Flu A&B, Covid) Anterior Nasal Swab     Status: None   Collection Time: 09/03/23  1:48 PM   Specimen: Anterior Nasal Swab  Result Value Ref Range Status   SARS Coronavirus 2 by RT PCR NEGATIVE NEGATIVE Final    Comment: (NOTE) SARS-CoV-2 target nucleic acids are NOT DETECTED.  The SARS-CoV-2 RNA is generally detectable in upper respiratory specimens during the acute phase of infection. The lowest concentration of SARS-CoV-2 viral copies this assay can detect is 138  copies/mL. A negative result does not preclude SARS-Cov-2 infection and should not be used as the sole basis for treatment or other patient management decisions. A negative result may occur with  improper specimen collection/handling, submission of specimen other than nasopharyngeal swab, presence of viral mutation(s) within the areas targeted by this assay, and inadequate number of viral copies(<138 copies/mL). A negative result must be combined with clinical observations, patient history, and epidemiological information. The expected result is Negative.  Fact Sheet for Patients:  BloggerCourse.com  Fact Sheet for Healthcare Providers:  SeriousBroker.it  This test is no t yet approved or cleared by the [place] FDA and  has been authorized for detection and/or diagnosis of SARS-CoV-2 by FDA under an Emergency Use Authorization (EUA). This EUA will remain  in effect (meaning this test can be used) for the duration of the COVID-19 declaration under Section 564(b)(1) of the Act, 21 U.S.C.section 360bbb-3(b)(1), unless the authorization is terminated  or revoked sooner.       Influenza A by PCR NEGATIVE NEGATIVE Final   Influenza B by PCR NEGATIVE NEGATIVE Final    Comment: (NOTE) The  Xpert Xpress SARS-CoV-2/FLU/RSV plus assay is intended as an aid in the diagnosis of influenza from Nasopharyngeal swab specimens and should not be used as a sole basis for treatment. Nasal washings and aspirates are unacceptable for Xpert Xpress SARS-CoV-2/FLU/RSV testing.  Fact Sheet for Patients: BloggerCourse.com  Fact Sheet for Healthcare Providers: SeriousBroker.it  This test is not yet approved or cleared by the [place] FDA and has been authorized for detection and/or diagnosis of SARS-CoV-2 by FDA under an Emergency Use Authorization (EUA). This EUA will remain in effect (meaning  this test can be used) for the duration of the COVID-19 declaration under Section 564(b)(1) of the Act, 21 U.S.C. section 360bbb-3(b)(1), unless the authorization is terminated or revoked.     Resp Syncytial Virus by PCR NEGATIVE NEGATIVE Final    Comment: (NOTE) Fact Sheet for Patients: BloggerCourse.com  Fact Sheet for Healthcare Providers: SeriousBroker.it  This test is not yet approved or cleared by the [place] FDA and has been authorized for detection and/or diagnosis of SARS-CoV-2 by FDA under an Emergency Use Authorization (EUA). This EUA will remain in effect (meaning this test can be used) for the duration of the COVID-19 declaration under Section 564(b)(1) of the Act, 21 U.S.C. section 360bbb-3(b)(1), unless the authorization is terminated or revoked.  Performed at [facility], [address] [place], [place] [address]   Resp panel by RT-PCR (RSV, Flu A&B, Covid) Anterior Nasal Swab     Status: None   Collection Time: 09/05/23  5:19 PM   Specimen: Anterior Nasal Swab  Result Value Ref Range Status   SARS Coronavirus 2 by RT PCR NEGATIVE NEGATIVE Final    Comment: (NOTE) SARS-CoV-2 target nucleic acids are NOT DETECTED.  The SARS-CoV-2 RNA is generally detectable in upper respiratory specimens during the acute phase of infection. The lowest concentration of SARS-CoV-2 viral copies this assay can detect is 138 copies/mL. A negative result does not preclude SARS-Cov-2 infection and should not be used as the sole basis for treatment or other patient management decisions. A negative result may occur with  improper specimen collection/handling, submission of specimen other than nasopharyngeal swab, presence of viral mutation(s) within the areas targeted by this assay, and inadequate number of viral copies(<138 copies/mL). A negative result must be combined with clinical observations, patient history, and  epidemiological information. The expected result is Negative.  Fact Sheet for Patients:  BloggerCourse.com  Fact Sheet for Healthcare Providers:  SeriousBroker.it  This test is no t yet approved or cleared by the [place] FDA and  has been authorized for detection and/or diagnosis of SARS-CoV-2 by FDA under an Emergency Use Authorization (EUA). This EUA will remain  in effect (meaning this test can be used) for the duration of the COVID-19 declaration under Section 564(b)(1) of the Act, 21 U.S.C.section 360bbb-3(b)(1), unless the authorization is terminated  or revoked sooner.       Influenza A by PCR NEGATIVE NEGATIVE Final   Influenza B by PCR NEGATIVE NEGATIVE Final    Comment: (NOTE) The Xpert Xpress SARS-CoV-2/FLU/RSV plus assay is intended as an aid in the diagnosis of influenza from Nasopharyngeal swab specimens and should not be used as a sole basis for treatment. Nasal washings and aspirates are unacceptable for Xpert Xpress SARS-CoV-2/FLU/RSV testing.  Fact Sheet for Patients: BloggerCourse.com  Fact Sheet for Healthcare Providers: SeriousBroker.it  This test is not yet approved or cleared by the [place] FDA and has been authorized for detection and/or diagnosis of SARS-CoV-2 by FDA  under an Emergency Use Authorization (EUA). This EUA will remain in effect (meaning this test can be used) for the duration of the COVID-19 declaration under Section 564(b)(1) of the Act, 21 U.S.C. section 360bbb-3(b)(1), unless the authorization is terminated or revoked.     Resp Syncytial Virus by PCR NEGATIVE NEGATIVE Final    Comment: (NOTE) Fact Sheet for Patients: BloggerCourse.com  Fact Sheet for Healthcare Providers: SeriousBroker.it  This test is not yet approved or cleared by the [place] FDA and has been  authorized for detection and/or diagnosis of SARS-CoV-2 by FDA under an Emergency Use Authorization (EUA). This EUA will remain in effect (meaning this test can be used) for the duration of the COVID-19 declaration under Section 564(b)(1) of the Act, 21 U.S.C. section 360bbb-3(b)(1), unless the authorization is terminated or revoked.  Performed at [facility], [address] [place], [place] [address]   SARS Coronavirus 2 by RT PCR (hospital order, performed in [facility] hospital lab) *cepheid single result test* Anterior Nasal Swab     Status: None   Collection Time: 09/10/23 12:20 PM   Specimen: Anterior Nasal Swab  Result Value Ref Range Status   SARS Coronavirus 2 by RT PCR NEGATIVE NEGATIVE Final    Comment: (NOTE) SARS-CoV-2 target nucleic acids are NOT DETECTED.  The SARS-CoV-2 RNA is generally detectable in upper and lower respiratory specimens during the acute phase of infection. The lowest concentration of SARS-CoV-2 viral copies this assay can detect is 250 copies / mL. A negative result does not preclude SARS-CoV-2 infection and should not be used as the sole basis for treatment or other patient management decisions.  A negative result may occur with improper specimen collection / handling, submission of specimen other than nasopharyngeal swab, presence of viral mutation(s) within the areas targeted by this assay, and inadequate number of viral copies (<250 copies / mL). A negative result must be combined with clinical observations, patient history, and epidemiological information.  Fact Sheet for Patients:   RoadLapTop.co.za  Fact Sheet for Healthcare Providers: http://kim-miller.com/  This test is not yet approved or  cleared by the [place] FDA and has been authorized for detection and/or diagnosis of SARS-CoV-2 by FDA under an Emergency Use Authorization (EUA).  This EUA will remain in effect  (meaning this test can be used) for the duration of the COVID-19 declaration under Section 564(b)(1) of the Act, 21 U.S.C. section 360bbb-3(b)(1), unless the authorization is terminated or revoked sooner.  Performed at [facility], [address] [place], [place] [address]     Labs: CBC: Recent Labs  Lab 09/13/23 1415 09/17/23 0811  WBC 9.8 10.0  NEUTROABS 7.5 6.0  HGB 9.1* 11.0*  HCT 27.5* 32.7*  MCV 91.4 90.3  PLT 183 273   Basic Metabolic Panel: Recent Labs  Lab 09/13/23 1415 09/17/23 0811  NA 132* 135  K 4.3 3.7  CL 100 102  CO2 20* 21*  GLUCOSE 94 91  BUN 44* 44*  CREATININE 0.87 0.68  CALCIUM 8.2* 8.9   Liver Function Tests: No results for input(s): "AST", "ALT", "ALKPHOS", "BILITOT", "PROT", "ALBUMIN" in the last 168 hours. CBG: No results for input(s): "GLUCAP" in the last 168 hours.  Discharge time spent: greater than 30 minutes.  Signed: Delfino Lovett, MD [facility] 09/19/2023

## 2023-09-19 NOTE — TOC Progression Note (Addendum)
 Transition of Care [facility]) - Progression Note    Patient Details  Name: Kathleen Paul MRN: [id number] Date of Birth: [birth year]  Transition of Care [facility]) CM/SW Contact  [name], RN Phone Number: 09/19/2023, 1:07 PM  Clinical Narrative:     Spoke with the son Letica Giaimo Son Emergency Contact [phone]  , I notified him that Ins approved her to go to [facility] will transport they have been called and arrannged       Expected Discharge Plan and Services         Expected Discharge Date: 09/19/23                                     Social Determinants of Health (SDOH) Interventions SDOH Screenings   Food Insecurity: No Food Insecurity (09/02/2023)  Housing: Low Risk  (09/02/2023)  Transportation Needs: No Transportation Needs (09/02/2023)  Utilities: Not At Risk (09/02/2023)  Alcohol Screen: Low Risk  (12/18/2022)  Depression (PHQ2-9): Low Risk  (12/18/2022)  Financial Resource Strain: Low Risk  (12/18/2022)  Physical Activity: Inactive (12/18/2022)  Social Connections: Socially Isolated (09/02/2023)  Stress: No Stress Concern Present (12/18/2022)  Tobacco Use: Medium Risk (09/01/2023)    Readmission Risk Interventions     No data to display

## 2023-09-19 NOTE — Progress Notes (Signed)
 Report called to nurse Fatmata at [facility].

## 2023-09-19 NOTE — Progress Notes (Signed)
 Discharge packet sent with [facility]. Patient being transported to [facility]. Son at bedside prior to patient leaving. Discharged to rehab. Alert with no distress noted when leaving unit.

## 2023-09-19 NOTE — Progress Notes (Signed)
 Physical Therapy Treatment Patient Details Name: Kathleen Paul MRN: [id number] DOB: 03-Mar-1936 Today's Date: 09/19/2023   History of Present Illness Kathleen Paul is an 87yoF who comes to [facility] on 2/22 after fall at home. Noted elevation of troponin as well. Foundtohave Acute mildly displaced fracture of the mid right humeral diaphysis; managed non surgically. 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    PT Comments  Pt is A and O x 4. Eager to get OOB to recliner and DC to rehab. Pt demonstrated much improved strength and abilities. She was able to progress from long sitting in bed to short sitting EOB with mod assist of 1. Pt stood 3 x from EOB to hemi walker. Tolerated standing ~ 20-30 sec each standing trial. Session progress to pt taking steps from EOB to recliner with +1 LUE support on hemi walker. Pt does fatigue quickly in standing however much improved abilities to progress BLEs to taking steps from ~ 5 days prior. Pt perform BLE there ex in sitting to promote strengthening of BLEs. Overall, pt is progressing well. DC recs remain appropriate to maximize pt's independence and safety with all ADLs. Pt remains not at her baseline abilities from prior to admission.    If plan is discharge home, recommend the following: A lot of help with walking and/or transfers;A lot of help with bathing/dressing/bathroom;Assist for transportation;Help with stairs or ramp for entrance;Assistance with cooking/housework     Equipment Recommendations  Other (comment) (defer to next level of care)       Precautions / Restrictions Precautions Precautions: Fall;Shoulder Shoulder Interventions: Shoulder sling/immobilizer Required Braces or Orthoses: Splint/Cast;Sling;Other Brace Splint/Cast: RUE splint in 90 degrees. Noteable R hand swelling Splint/Cast - Date Prophylactic Dressing Applied (if applicable):  09/01/23 Restrictions Weight Bearing Restrictions Per Provider Order: Yes RUE Weight Bearing Per Provider Order: Non weight bearing     Mobility  Bed Mobility Overal bed mobility: Needs Assistance Bed Mobility: Supine to Sit  Supine to sit: Mod assist, HOB elevated, Used rails  Patient Response: Cooperative  Transfers Overall transfer level: Needs assistance Equipment used: Hemi-walker Transfers: Sit to/from Stand, Bed to chair/wheelchair/BSC Sit to Stand: Mod assist Step pivot transfers: Mod assist  General transfer comment: pt stood EOB 3 x to hemi walker prior to taking steps to recliner. Pt with much improved strength and standing tolerance versus ~ 5 days prior. Pt does fatigue however pt tolerates standing for ~ 20-30 sec each standing trial. after taking steps to recliner, pt stood 1 additionall time from recliner with mod A. struggles to progress to taking additional steps after transfer to recliner due to fatigue. pt puts forth great effort throughout.    Ambulation/Gait Ambulation/Gait assistance: Mod assist Gait Distance (Feet): 3 Feet Assistive device: Hemi-walker Gait Pattern/deviations: Step-to pattern, Shuffle, Trunk flexed Gait velocity: decreased  General Gait Details: pt was able to progress BLEs to take steps form EOB to recliner. attempted additional gait after pt was in recliner however pt too fatigued to ambulate greater distance.    Balance Overall balance assessment: Needs assistance Sitting-balance support: No upper extremity supported, Feet supported Sitting balance-Leahy Scale: Good     Standing balance support: Single extremity supported, During functional activity, Reliant on assistive device for balance Standing balance-Leahy Scale: Poor Standing balance comment: pt's standing balance remains poor but is improving with improved standing tolerance + 1 UE support. Pt's RUE NWB restrictions continues to  limit pt progress      Communication  Communication Communication: No apparent difficulties  Cognition Arousal: Alert Behavior During Therapy: WFL for tasks assessed/performed   PT - Cognitive impairments: No apparent impairments      PT - Cognition Comments: Pt is A and O and cooperative throughout. Following commands: Intact      Cueing Cueing Techniques: Verbal cues, Tactile cues         Pertinent Vitals/Pain Pain Assessment Pain Assessment: 0-10 Pain Score: 3  Pain Location: R UE Pain Descriptors / Indicators: Sore Pain Intervention(s): Limited activity within patient's tolerance, Monitored during session, Repositioned     PT Goals (current goals can now be found in the care plan section) Acute Rehab PT Goals Patient Stated Goal: go to rehab today Progress towards PT goals: Progressing toward goals    Frequency    7X/week           Co-evaluation     PT goals addressed during session: Mobility/safety with mobility;Balance;Proper use of DME;Strengthening/ROM        AM-PAC PT "6 Clicks" Mobility   Outcome Measure  Help needed turning from your back to your side while in a flat bed without using bedrails?: A Lot Help needed moving from lying on your back to sitting on the side of a flat bed without using bedrails?: A Lot Help needed moving to and from a bed to a chair (including a wheelchair)?: A Lot Help needed standing up from a chair using your arms (e.g., wheelchair or bedside chair)?: A Lot Help needed to walk in hospital room?: A Lot Help needed climbing 3-5 steps with a railing? : Total 6 Click Score: 11    End of Session Equipment Utilized During Treatment: Gait belt Activity Tolerance: Patient tolerated treatment well;Patient limited by fatigue Patient left: in chair;with call bell/phone within reach Nurse Communication: Mobility status PT Visit Diagnosis: Difficulty in walking, not elsewhere classified (R26.2);Other abnormalities of gait and mobility (R26.89);Repeated falls  (R29.6);Muscle weakness (generalized) (M62.81)     Time: 1610-9604 PT Time Calculation (min) (ACUTE ONLY): 20 min  Charges:    $Therapeutic Activity: 8-22 mins PT General Charges $$ ACUTE PT VISIT: 1 Visit                    Jetta Lout PTA 09/19/23, 8:13 AM

## 2023-09-20 DIAGNOSIS — S42301A Unspecified fracture of shaft of humerus, right arm, initial encounter for closed fracture: Secondary | ICD-10-CM | POA: Diagnosis not present

## 2023-09-20 DIAGNOSIS — E871 Hypo-osmolality and hyponatremia: Secondary | ICD-10-CM | POA: Diagnosis not present

## 2023-09-20 DIAGNOSIS — D649 Anemia, unspecified: Secondary | ICD-10-CM | POA: Diagnosis not present

## 2023-09-20 DIAGNOSIS — I471 Supraventricular tachycardia, unspecified: Secondary | ICD-10-CM | POA: Diagnosis not present

## 2023-09-20 DIAGNOSIS — N183 Chronic kidney disease, stage 3 unspecified: Secondary | ICD-10-CM | POA: Diagnosis not present

## 2023-09-20 DIAGNOSIS — J189 Pneumonia, unspecified organism: Secondary | ICD-10-CM | POA: Diagnosis not present

## 2023-09-20 DIAGNOSIS — I10 Essential (primary) hypertension: Secondary | ICD-10-CM | POA: Diagnosis not present

## 2023-09-20 DIAGNOSIS — S40021A Contusion of right upper arm, initial encounter: Secondary | ICD-10-CM | POA: Diagnosis not present

## 2023-09-24 DIAGNOSIS — S40021A Contusion of right upper arm, initial encounter: Secondary | ICD-10-CM | POA: Diagnosis not present

## 2023-09-24 DIAGNOSIS — I471 Supraventricular tachycardia, unspecified: Secondary | ICD-10-CM | POA: Diagnosis not present

## 2023-09-24 DIAGNOSIS — E871 Hypo-osmolality and hyponatremia: Secondary | ICD-10-CM | POA: Diagnosis not present

## 2023-09-24 DIAGNOSIS — D649 Anemia, unspecified: Secondary | ICD-10-CM | POA: Diagnosis not present

## 2023-09-24 DIAGNOSIS — I10 Essential (primary) hypertension: Secondary | ICD-10-CM | POA: Diagnosis not present

## 2023-09-24 DIAGNOSIS — J189 Pneumonia, unspecified organism: Secondary | ICD-10-CM | POA: Diagnosis not present

## 2023-09-24 DIAGNOSIS — S42301A Unspecified fracture of shaft of humerus, right arm, initial encounter for closed fracture: Secondary | ICD-10-CM | POA: Diagnosis not present

## 2023-09-24 DIAGNOSIS — N183 Chronic kidney disease, stage 3 unspecified: Secondary | ICD-10-CM | POA: Diagnosis not present

## 2023-09-25 DIAGNOSIS — S40021A Contusion of right upper arm, initial encounter: Secondary | ICD-10-CM | POA: Diagnosis not present

## 2023-09-25 DIAGNOSIS — I471 Supraventricular tachycardia, unspecified: Secondary | ICD-10-CM | POA: Diagnosis not present

## 2023-09-25 DIAGNOSIS — J189 Pneumonia, unspecified organism: Secondary | ICD-10-CM | POA: Diagnosis not present

## 2023-09-25 DIAGNOSIS — M79641 Pain in right hand: Secondary | ICD-10-CM | POA: Diagnosis not present

## 2023-09-25 DIAGNOSIS — R110 Nausea: Secondary | ICD-10-CM | POA: Diagnosis not present

## 2023-09-25 DIAGNOSIS — M25431 Effusion, right wrist: Secondary | ICD-10-CM | POA: Diagnosis not present

## 2023-09-25 DIAGNOSIS — S42301A Unspecified fracture of shaft of humerus, right arm, initial encounter for closed fracture: Secondary | ICD-10-CM | POA: Diagnosis not present

## 2023-09-25 DIAGNOSIS — E871 Hypo-osmolality and hyponatremia: Secondary | ICD-10-CM | POA: Diagnosis not present

## 2023-09-25 DIAGNOSIS — N183 Chronic kidney disease, stage 3 unspecified: Secondary | ICD-10-CM | POA: Diagnosis not present

## 2023-09-25 DIAGNOSIS — M25531 Pain in right wrist: Secondary | ICD-10-CM | POA: Diagnosis not present

## 2023-09-25 DIAGNOSIS — M13831 Other specified arthritis, right wrist: Secondary | ICD-10-CM | POA: Diagnosis not present

## 2023-09-25 DIAGNOSIS — I10 Essential (primary) hypertension: Secondary | ICD-10-CM | POA: Diagnosis not present

## 2023-09-25 DIAGNOSIS — D649 Anemia, unspecified: Secondary | ICD-10-CM | POA: Diagnosis not present

## 2023-09-26 DIAGNOSIS — S42301A Unspecified fracture of shaft of humerus, right arm, initial encounter for closed fracture: Secondary | ICD-10-CM | POA: Diagnosis not present

## 2023-09-26 DIAGNOSIS — E871 Hypo-osmolality and hyponatremia: Secondary | ICD-10-CM | POA: Diagnosis not present

## 2023-09-26 DIAGNOSIS — D649 Anemia, unspecified: Secondary | ICD-10-CM | POA: Diagnosis not present

## 2023-09-26 DIAGNOSIS — I471 Supraventricular tachycardia, unspecified: Secondary | ICD-10-CM | POA: Diagnosis not present

## 2023-09-26 DIAGNOSIS — S42341A Displaced spiral fracture of shaft of humerus, right arm, initial encounter for closed fracture: Secondary | ICD-10-CM | POA: Diagnosis not present

## 2023-09-26 DIAGNOSIS — I10 Essential (primary) hypertension: Secondary | ICD-10-CM | POA: Diagnosis not present

## 2023-09-26 DIAGNOSIS — N183 Chronic kidney disease, stage 3 unspecified: Secondary | ICD-10-CM | POA: Diagnosis not present

## 2023-09-26 DIAGNOSIS — J189 Pneumonia, unspecified organism: Secondary | ICD-10-CM | POA: Diagnosis not present

## 2023-09-26 DIAGNOSIS — S40021A Contusion of right upper arm, initial encounter: Secondary | ICD-10-CM | POA: Diagnosis not present

## 2023-09-28 ENCOUNTER — Encounter: Payer: Self-pay | Admitting: Internal Medicine

## 2023-09-28 DIAGNOSIS — N183 Chronic kidney disease, stage 3 unspecified: Secondary | ICD-10-CM | POA: Diagnosis not present

## 2023-09-28 DIAGNOSIS — I10 Essential (primary) hypertension: Secondary | ICD-10-CM | POA: Diagnosis not present

## 2023-09-28 DIAGNOSIS — J189 Pneumonia, unspecified organism: Secondary | ICD-10-CM | POA: Diagnosis not present

## 2023-09-28 DIAGNOSIS — S40021A Contusion of right upper arm, initial encounter: Secondary | ICD-10-CM | POA: Diagnosis not present

## 2023-09-28 DIAGNOSIS — S42301A Unspecified fracture of shaft of humerus, right arm, initial encounter for closed fracture: Secondary | ICD-10-CM | POA: Diagnosis not present

## 2023-09-28 DIAGNOSIS — E871 Hypo-osmolality and hyponatremia: Secondary | ICD-10-CM | POA: Diagnosis not present

## 2023-09-28 DIAGNOSIS — D649 Anemia, unspecified: Secondary | ICD-10-CM | POA: Diagnosis not present

## 2023-09-28 DIAGNOSIS — I471 Supraventricular tachycardia, unspecified: Secondary | ICD-10-CM | POA: Diagnosis not present

## 2023-10-01 DIAGNOSIS — S40021A Contusion of right upper arm, initial encounter: Secondary | ICD-10-CM | POA: Diagnosis not present

## 2023-10-01 DIAGNOSIS — N183 Chronic kidney disease, stage 3 unspecified: Secondary | ICD-10-CM | POA: Diagnosis not present

## 2023-10-01 DIAGNOSIS — E871 Hypo-osmolality and hyponatremia: Secondary | ICD-10-CM | POA: Diagnosis not present

## 2023-10-01 DIAGNOSIS — I82601 Acute embolism and thrombosis of unspecified veins of right upper extremity: Secondary | ICD-10-CM | POA: Diagnosis not present

## 2023-10-01 DIAGNOSIS — J189 Pneumonia, unspecified organism: Secondary | ICD-10-CM | POA: Diagnosis not present

## 2023-10-01 DIAGNOSIS — I471 Supraventricular tachycardia, unspecified: Secondary | ICD-10-CM | POA: Diagnosis not present

## 2023-10-01 DIAGNOSIS — S42301A Unspecified fracture of shaft of humerus, right arm, initial encounter for closed fracture: Secondary | ICD-10-CM | POA: Diagnosis not present

## 2023-10-01 DIAGNOSIS — I10 Essential (primary) hypertension: Secondary | ICD-10-CM | POA: Diagnosis not present

## 2023-10-01 DIAGNOSIS — D649 Anemia, unspecified: Secondary | ICD-10-CM | POA: Diagnosis not present

## 2023-10-02 ENCOUNTER — Telehealth: Payer: Self-pay

## 2023-10-02 ENCOUNTER — Ambulatory Visit
Admission: RE | Admit: 2023-10-02 | Discharge: 2023-10-02 | Disposition: A | Discharge status: Home / Self Care | Source: Ambulatory Visit | Attending: Orthopaedic Surgery | Admitting: Orthopaedic Surgery

## 2023-10-02 ENCOUNTER — Other Ambulatory Visit: Payer: Self-pay | Admitting: Orthopaedic Surgery

## 2023-10-02 DIAGNOSIS — S2220XA Unspecified fracture of sternum, initial encounter for closed fracture: Secondary | ICD-10-CM

## 2023-10-02 DIAGNOSIS — G8929 Other chronic pain: Secondary | ICD-10-CM

## 2023-10-02 DIAGNOSIS — M25511 Pain in right shoulder: Secondary | ICD-10-CM | POA: Diagnosis not present

## 2023-10-02 DIAGNOSIS — S42301A Unspecified fracture of shaft of humerus, right arm, initial encounter for closed fracture: Secondary | ICD-10-CM | POA: Diagnosis not present

## 2023-10-02 DIAGNOSIS — M19011 Primary osteoarthritis, right shoulder: Secondary | ICD-10-CM | POA: Diagnosis not present

## 2023-10-02 NOTE — Telephone Encounter (Signed)
   Pre-operative Risk Assessment    Patient Name: Kathleen Paul  DOB: [birth year] MRN: [id number]   Date of last office visit: 07/31/22 Dietrich Pates, MD Date of next office visit: NONE   Request for Surgical Clearance    Procedure:   RIGHT REVERSE TOTAL SHOULDER ARTHROPLASTY, ORIF HUMERUS SHAFT FRACTURE  Date of Surgery:  Clearance TBD                                Surgeon:  Ramond Marrow, MD Surgeon's Group or Practice Name:  [facility] Phone number:  [phone] Fax number:  [phone] ATTN: Silvestre Mesi   Type of Clearance Requested:   - Medical  - Pharmacy:  Hold Aspirin     Type of Anesthesia:  General W/ INTERSCALENE BLOCK   Additional requests/questions:    Signed, Marlow Baars   10/02/2023, 2:08 PM

## 2023-10-02 NOTE — Telephone Encounter (Signed)
   Name: Kathleen Paul  DOB: 10-22-1935  MRN: [id number]  Primary Cardiologist: Dietrich Pates, MD  Chart reviewed as part of pre-operative protocol coverage. Because of [name] past medical history and time since last visit, she will require a follow-up in-office visit in order to better assess preoperative cardiovascular risk.  Pre-op covering staff: - Please schedule appointment and call patient to inform them. If patient already had an upcoming appointment within acceptable timeframe, please add "pre-op clearance" to the appointment notes so provider is aware. - Please contact requesting surgeon's office via preferred method (i.e, phone, fax) to inform them of need for appointment prior to surgery.   Carlos Levering, NP  10/02/2023, 2:38 PM

## 2023-10-02 NOTE — Telephone Encounter (Signed)
 Left message to call back and schedule IN OFFICE PREOP APPT.

## 2023-10-03 ENCOUNTER — Telehealth: Payer: Self-pay | Admitting: Nurse Practitioner

## 2023-10-03 NOTE — Telephone Encounter (Signed)
 Pt's daughter requesting cb regarding pre-op appt

## 2023-10-03 NOTE — Telephone Encounter (Signed)
 Please get scan results    Note by daughter   ? If she has a blood clot   Typos

## 2023-10-04 DIAGNOSIS — I471 Supraventricular tachycardia, unspecified: Secondary | ICD-10-CM | POA: Diagnosis not present

## 2023-10-04 DIAGNOSIS — N183 Chronic kidney disease, stage 3 unspecified: Secondary | ICD-10-CM | POA: Diagnosis not present

## 2023-10-04 DIAGNOSIS — S40021A Contusion of right upper arm, initial encounter: Secondary | ICD-10-CM | POA: Diagnosis not present

## 2023-10-04 DIAGNOSIS — I10 Essential (primary) hypertension: Secondary | ICD-10-CM | POA: Diagnosis not present

## 2023-10-04 DIAGNOSIS — J189 Pneumonia, unspecified organism: Secondary | ICD-10-CM | POA: Diagnosis not present

## 2023-10-04 DIAGNOSIS — I82601 Acute embolism and thrombosis of unspecified veins of right upper extremity: Secondary | ICD-10-CM | POA: Diagnosis not present

## 2023-10-04 DIAGNOSIS — E871 Hypo-osmolality and hyponatremia: Secondary | ICD-10-CM | POA: Diagnosis not present

## 2023-10-04 DIAGNOSIS — D649 Anemia, unspecified: Secondary | ICD-10-CM | POA: Diagnosis not present

## 2023-10-04 NOTE — Telephone Encounter (Signed)
 Pt has been scheduled. 10/08/23

## 2023-10-04 NOTE — Telephone Encounter (Signed)
 Pt is scheduled to see Robin Searing, NP, 10/08/23, clearance will be addressed at that time.    Will route to the requesting surgeon's office to make them aware.

## 2023-10-07 NOTE — Progress Notes (Unsigned)
 Cardiology Office Note    Patient Name: Kathleen Paul Date of Encounter: 10/07/2023  Primary Care Provider:  Doreene Nest, NP Primary Cardiologist:  Dietrich Pates, MD Primary Electrophysiologist: None   Past Medical History    Past Medical History:  Diagnosis Date   1st degree AV block    Anxiety state, unspecified    Arthritis    Atherosclerosis    Cancer [facility])    skin cancer   Cellulitis 10/20/2022   Diastolic dysfunction    Diverticulosis    Esophageal stricture    Essential hypertension, benign    Expressive aphasia 06/12/2022   Gastric ulcer    Hiatal hernia    History of DVT (deep vein thrombosis) yrs ago   legs   IDA (iron deficiency anemia)    Inguinal hernia without mention of obstruction or gangrene, unilateral or unspecified, (not specified as recurrent)    2014   Internal hemorrhoids    Mild mitral regurgitation    a. 04/2010 Echo: nl EF, mild diast dysfxn, trace PR, mild TR, mild-mod MR; b. 06/2022 Echo: EF 50-55%, GrI DD, nl RV fxn, triv MR, mild AI.   Mobitz I    Non-obstructive CAD    a. 04/2010 Myoview: EF 77%, fixed apical/inferior defect;  b. 04/2010 Cath: LM nl, LAD 58m, LCX/RCA minor irregs, EF 60%;  c. 04/2011 Myoview: low risk.   Overweight (BMI 25.0-29.9) 06/04/2019   PSVT (paroxysmal supraventricular tachycardia) (HCC)    a. 06/2022 Zio: predominantly sinus rhythm, 1st deg avb, intermittnet Mobitz I, 108 SVT runs (longest 62m 54s, fastest 176). 14.8% PAC burden. Rare PVCs.   Pure hypercholesterolemia    Rupture of left patellar tendon    05-26-2019   Scoliosis    Sepsis (HCC) 10/21/2022   Status post total left knee replacement 02/11/2019   Suprapubic mass 08/20/2012   TIA (transient ischemic attack)    UI (urinary incontinence)    Umbilical hernia without mention of obstruction or gangrene    2014   Unspecified hypothyroidism    Vitamin D deficiency     History of Present Illness  Kathleen Paul is a 88 y.o. female with a PMH  of mild CAD by LHC 50% LAD in 2011 and NSTEMI 09/02/2023 due to demand ischemia following mechanical fall, HLD, TIA, remote DVT not on AC, HTN, CKD stage IIIa, hypothyroidism, PSVT who presents today for preoperative clearance.  Kathleen Paul has been followed by Dr. Tenny Craw since 2012 and previously followed by Dr. Lennette Bihari in [place].  She had a LHC completed in 2011 for complaint of chest tightness that showed 50% LAD stenosis and 2D echo showing EF of 60%.  She was admitted on 06/11/2022 with altered mental status and aphasia and underwent MRA and CTA that showed distal lesion in the right vertebral artery that was not atretic or atherosclerotic and no evidence of acute stroke.  She was diagnosed with TIA and was discharged with ASA 81 mg and Plavix 75 mg.  She was last seen by Dr. Tenny Craw on 07/31/2022 and was doing well with no further neurologic symptoms.  She was admitted on 10/20/2022 with weakness and found to have sepsis due to pneumonia and lower left extremity cellulitis.  She was discharged to an SNF for further recommendations and therapy.  She presented to the ED on 09/01/2023 after suffering a mechanical fall with elevated troponins due to demand ischemia with peak of 1431.  EKG showed sinus rhythm with PVC, PACs, inferior infarct, and subtle  inferolateral ST elevation.  She was started on heparin drip but later discontinued due to anemia.  She was transfused 3 units of PRBC during admission.  She injured her right shoulder and had x-ray showing right humeral fracture and was placed in a cast.  She denied chest pain and was evaluated by cardiology.  She underwent a 2D echo that showed no RWMA and EF of 55 to 60% with grade 1 DD.  She developed an episode of sinus tachycardia with rate of 180 bpm which spontaneously resolved during admission.  She was restarted on metoprolol 25 mg twice daily and deemed not a candidate for LHC due to anemia with no further ischemic evaluation.  Patient denies chest pain,  palpitations, dyspnea, PND, orthopnea, nausea, vomiting, dizziness, syncope, edema, weight gain, or early satiety.   Discussed the use of AI scribe software for clinical note transcription with the patient, who gave verbal consent to proceed.  History of Present Illness    ***Notes: Right reverse total shoulder arthroplasty with ORIF of humerus shaft fracture-guidance for holding aspirin -Last ischemic evaluation:  Review of Systems  Please see the history of present illness.    All other systems reviewed and are otherwise negative except as noted above.  Physical Exam    Wt Readings from Last 3 Encounters:  09/01/23 138 lb (62.6 kg)  12/18/22 150 lb (68 kg)  11/22/22 154 lb (69.9 kg)   XB:JYNWG were no vitals filed for this visit.,There is no height or weight on file to calculate BMI. GEN: Well nourished, well developed in no acute distress Neck: No JVD; No carotid bruits Pulmonary: Clear to auscultation without rales, wheezing or rhonchi  Cardiovascular: Normal rate. Regular rhythm. Normal S1. Normal S2.   Murmurs: There is no murmur.  ABDOMEN: Soft, non-tender, non-distended EXTREMITIES:  No edema; No deformity   EKG/LABS/ Recent Cardiac Studies   ECG personally reviewed by me today   Risk Assessment/Calculations:   {Does this patient have ATRIAL FIBRILLATION?:[phone]}      Lab Results  Component Value Date   WBC 10.0 09/17/2023   HGB 11.0 (L) 09/17/2023   HCT 32.7 (L) 09/17/2023   MCV 90.3 09/17/2023   PLT 273 09/17/2023   Lab Results  Component Value Date   CREATININE 0.68 09/17/2023   BUN 44 (H) 09/17/2023   NA 135 09/17/2023   K 3.7 09/17/2023   CL 102 09/17/2023   CO2 21 (L) 09/17/2023   Lab Results  Component Value Date   CHOL 144 09/02/2023   HDL 68 09/02/2023   LDLCALC 65 09/02/2023   LDLDIRECT 180.2 07/17/2013   TRIG 57 09/02/2023   CHOLHDL 2.1 09/02/2023    Lab Results  Component Value Date   HGBA1C 5.5 09/02/2023   Assessment &  Plan    1.  Preoperative clearance: -Patient's RCRI score is 11%  2.  Nonobstructive CAD: -s/p LHC 2011 showing 50% LAD and elevated troponins deemed demand ischemia with no complaint of chest pain -Today patient reports***  3.  Essential hypertension: -Patient's blood pressure today was***  4.  PSVT: -Patient had episode of tachycardia that resolved and was started on metoprolol 25 mg twice daily  5.  Hyperlipidemia: -Patient's last LDL cholesterol was***      Disposition: Follow-up with Dietrich Pates, MD or APP in *** months {Are you ordering a CV Procedure (e.g. stress test, cath, DCCV, TEE, etc)?   Press F2        :[id number]}   Signed, Napoleon Form,  Leodis Rains, NP 10/07/2023, 12:36 PM [facility]

## 2023-10-08 ENCOUNTER — Encounter: Payer: Self-pay | Admitting: Nurse Practitioner

## 2023-10-08 ENCOUNTER — Ambulatory Visit: Admitting: Nurse Practitioner

## 2023-10-08 ENCOUNTER — Ambulatory Visit: Attending: Nurse Practitioner | Admitting: Nurse Practitioner

## 2023-10-08 VITALS — BP 127/73 | HR 72 | Ht 59.0 in | Wt 145.0 lb

## 2023-10-08 DIAGNOSIS — I82621 Acute embolism and thrombosis of deep veins of right upper extremity: Secondary | ICD-10-CM | POA: Diagnosis not present

## 2023-10-08 DIAGNOSIS — I10 Essential (primary) hypertension: Secondary | ICD-10-CM

## 2023-10-08 DIAGNOSIS — E785 Hyperlipidemia, unspecified: Secondary | ICD-10-CM

## 2023-10-08 DIAGNOSIS — I2510 Atherosclerotic heart disease of native coronary artery without angina pectoris: Secondary | ICD-10-CM | POA: Diagnosis not present

## 2023-10-08 DIAGNOSIS — Z01810 Encounter for preprocedural cardiovascular examination: Secondary | ICD-10-CM

## 2023-10-08 DIAGNOSIS — I471 Supraventricular tachycardia, unspecified: Secondary | ICD-10-CM

## 2023-10-08 NOTE — Telephone Encounter (Signed)
 Called [facility] [phone] and requested copy of ultrasound.  Requested ultrasound be faxed to [phone].

## 2023-10-08 NOTE — Patient Instructions (Signed)
 Medication Instructions:  Your physician recommends that you continue on your current medications as directed. Please refer to the Current Medication list given to you today. *If you need a refill on your cardiac medications before your next appointment, please call your pharmacy*  Lab Work: [facility] If you have labs (blood work) drawn today and your tests are completely normal, you will receive your results only by: MyChart Message (if you have MyChart) OR A paper copy in the mail If you have any lab test that is abnormal or we need to change your treatment, we will call you to review the results.  Testing/Procedures: NONE ORDERED  Follow-Up: At [facility], you and your health needs are our priority.  As part of our continuing mission to provide you with exceptional heart care, our providers are all part of one team.  This team includes your primary Cardiologist (physician) and Advanced Practice Providers or APPs (Physician Assistants and Nurse Practitioners) who all work together to provide you with the care you need, when you need it.  Your next appointment:   3 month(s)  Provider:   Dietrich Pates, MD  or Robin Searing, NP   We recommend signing up for the patient portal called "MyChart".  Sign up information is provided on this After Visit Summary.  MyChart is used to connect with patients for Virtual Visits (Telemedicine).  Patients are able to view lab/test results, encounter notes, upcoming appointments, etc.  Non-urgent messages can be sent to your provider as well.   To learn more about what you can do with MyChart, go to [email or web address].   Other Instructions       1st Floor: - Lobby - Registration  - Pharmacy  - Lab - Cafe  2nd Floor: - PV Lab - Diagnostic Testing (echo, CT, nuclear med)  3rd Floor: - Vacant  4th Floor: - TCTS (cardiothoracic surgery) - AFib Clinic - Structural Heart Clinic - Vascular Surgery  - Vascular Ultrasound  5th  Floor: - HeartCare Cardiology (general and EP) - Clinical Pharmacy for coumadin, hypertension, lipid, weight-loss medications, and med management appointments    Valet parking services will be available as well.

## 2023-10-09 LAB — CBC
Hematocrit: 32 % — ABNORMAL LOW (ref 34.0–46.6)
Hemoglobin: 10.4 g/dL — ABNORMAL LOW (ref 11.1–15.9)
MCH: 31.1 pg (ref 26.6–33.0)
MCHC: 32.5 g/dL (ref 31.5–35.7)
MCV: 96 fL (ref 79–97)
Platelets: 232 10*3/uL (ref 150–450)
RBC: 3.34 x10E6/uL — ABNORMAL LOW (ref 3.77–5.28)
RDW: 20.3 % — ABNORMAL HIGH (ref 11.7–15.4)
WBC: 7 10*3/uL (ref 3.4–10.8)

## 2023-10-09 NOTE — Telephone Encounter (Signed)
 She should be on anticoagulation for the DVT in her arm  3 months.  That is for the physician there to prescribe

## 2023-10-11 DIAGNOSIS — N183 Chronic kidney disease, stage 3 unspecified: Secondary | ICD-10-CM | POA: Diagnosis not present

## 2023-10-11 DIAGNOSIS — S42301A Unspecified fracture of shaft of humerus, right arm, initial encounter for closed fracture: Secondary | ICD-10-CM | POA: Diagnosis not present

## 2023-10-11 DIAGNOSIS — I82601 Acute embolism and thrombosis of unspecified veins of right upper extremity: Secondary | ICD-10-CM | POA: Diagnosis not present

## 2023-10-11 DIAGNOSIS — S40021A Contusion of right upper arm, initial encounter: Secondary | ICD-10-CM | POA: Diagnosis not present

## 2023-10-11 DIAGNOSIS — I471 Supraventricular tachycardia, unspecified: Secondary | ICD-10-CM | POA: Diagnosis not present

## 2023-10-11 DIAGNOSIS — I10 Essential (primary) hypertension: Secondary | ICD-10-CM | POA: Diagnosis not present

## 2023-10-11 DIAGNOSIS — E871 Hypo-osmolality and hyponatremia: Secondary | ICD-10-CM | POA: Diagnosis not present

## 2023-10-11 DIAGNOSIS — D649 Anemia, unspecified: Secondary | ICD-10-CM | POA: Diagnosis not present

## 2023-10-11 DIAGNOSIS — J189 Pneumonia, unspecified organism: Secondary | ICD-10-CM | POA: Diagnosis not present

## 2023-10-12 DIAGNOSIS — M25511 Pain in right shoulder: Secondary | ICD-10-CM | POA: Diagnosis not present

## 2023-10-16 DIAGNOSIS — J189 Pneumonia, unspecified organism: Secondary | ICD-10-CM | POA: Diagnosis not present

## 2023-10-16 DIAGNOSIS — S40021A Contusion of right upper arm, initial encounter: Secondary | ICD-10-CM | POA: Diagnosis not present

## 2023-10-16 DIAGNOSIS — N183 Chronic kidney disease, stage 3 unspecified: Secondary | ICD-10-CM | POA: Diagnosis not present

## 2023-10-16 DIAGNOSIS — I471 Supraventricular tachycardia, unspecified: Secondary | ICD-10-CM | POA: Diagnosis not present

## 2023-10-16 DIAGNOSIS — I82601 Acute embolism and thrombosis of unspecified veins of right upper extremity: Secondary | ICD-10-CM | POA: Diagnosis not present

## 2023-10-16 DIAGNOSIS — E871 Hypo-osmolality and hyponatremia: Secondary | ICD-10-CM | POA: Diagnosis not present

## 2023-10-16 DIAGNOSIS — S42301A Unspecified fracture of shaft of humerus, right arm, initial encounter for closed fracture: Secondary | ICD-10-CM | POA: Diagnosis not present

## 2023-10-16 DIAGNOSIS — D649 Anemia, unspecified: Secondary | ICD-10-CM | POA: Diagnosis not present

## 2023-10-16 DIAGNOSIS — I10 Essential (primary) hypertension: Secondary | ICD-10-CM | POA: Diagnosis not present

## 2023-10-22 DIAGNOSIS — E871 Hypo-osmolality and hyponatremia: Secondary | ICD-10-CM | POA: Diagnosis not present

## 2023-10-22 DIAGNOSIS — N183 Chronic kidney disease, stage 3 unspecified: Secondary | ICD-10-CM | POA: Diagnosis not present

## 2023-10-22 DIAGNOSIS — S42301A Unspecified fracture of shaft of humerus, right arm, initial encounter for closed fracture: Secondary | ICD-10-CM | POA: Diagnosis not present

## 2023-10-22 DIAGNOSIS — I10 Essential (primary) hypertension: Secondary | ICD-10-CM | POA: Diagnosis not present

## 2023-10-22 DIAGNOSIS — I471 Supraventricular tachycardia, unspecified: Secondary | ICD-10-CM | POA: Diagnosis not present

## 2023-10-22 DIAGNOSIS — J189 Pneumonia, unspecified organism: Secondary | ICD-10-CM | POA: Diagnosis not present

## 2023-10-22 DIAGNOSIS — D649 Anemia, unspecified: Secondary | ICD-10-CM | POA: Diagnosis not present

## 2023-10-22 DIAGNOSIS — I82601 Acute embolism and thrombosis of unspecified veins of right upper extremity: Secondary | ICD-10-CM | POA: Diagnosis not present

## 2023-10-22 DIAGNOSIS — S40021A Contusion of right upper arm, initial encounter: Secondary | ICD-10-CM | POA: Diagnosis not present

## 2023-10-25 ENCOUNTER — Telehealth: Payer: Self-pay

## 2023-10-25 NOTE — Telephone Encounter (Signed)
 Approve?

## 2023-10-25 NOTE — Telephone Encounter (Signed)
 Copied from CRM [id number]. Topic: Clinical - Home Health Verbal Orders >> Oct 25, 2023  3:09 PM Rosamond Comes wrote: Caller/Agency: [facility] Callback Number: [phone] #2  Service Requested: Occupational Therapy, Physical Therapy, and Skilled Nursing  Frequency: Delayed start date. Start date on 10/29/23 Any new concerns about the patient? No

## 2023-10-25 NOTE — Telephone Encounter (Signed)
 Called and advised Kathleen Paul with [facility]  of the approval of the requested verbal orders for this patient. Advised to call back with any further questions.

## 2023-10-29 DIAGNOSIS — F411 Generalized anxiety disorder: Secondary | ICD-10-CM | POA: Diagnosis not present

## 2023-10-29 DIAGNOSIS — D509 Iron deficiency anemia, unspecified: Secondary | ICD-10-CM | POA: Diagnosis not present

## 2023-10-29 DIAGNOSIS — E663 Overweight: Secondary | ICD-10-CM | POA: Diagnosis not present

## 2023-10-29 DIAGNOSIS — K5790 Diverticulosis of intestine, part unspecified, without perforation or abscess without bleeding: Secondary | ICD-10-CM | POA: Diagnosis not present

## 2023-10-29 DIAGNOSIS — Z604 Social exclusion and rejection: Secondary | ICD-10-CM | POA: Diagnosis not present

## 2023-10-29 DIAGNOSIS — E785 Hyperlipidemia, unspecified: Secondary | ICD-10-CM | POA: Diagnosis not present

## 2023-10-29 DIAGNOSIS — S42301D Unspecified fracture of shaft of humerus, right arm, subsequent encounter for fracture with routine healing: Secondary | ICD-10-CM | POA: Diagnosis not present

## 2023-10-29 DIAGNOSIS — R32 Unspecified urinary incontinence: Secondary | ICD-10-CM | POA: Diagnosis not present

## 2023-10-29 DIAGNOSIS — Z85828 Personal history of other malignant neoplasm of skin: Secondary | ICD-10-CM | POA: Diagnosis not present

## 2023-10-29 DIAGNOSIS — K449 Diaphragmatic hernia without obstruction or gangrene: Secondary | ICD-10-CM | POA: Diagnosis not present

## 2023-10-29 DIAGNOSIS — R4701 Aphasia: Secondary | ICD-10-CM | POA: Diagnosis not present

## 2023-10-29 DIAGNOSIS — K222 Esophageal obstruction: Secondary | ICD-10-CM | POA: Diagnosis not present

## 2023-10-29 DIAGNOSIS — Z8701 Personal history of pneumonia (recurrent): Secondary | ICD-10-CM | POA: Diagnosis not present

## 2023-10-29 DIAGNOSIS — Z7982 Long term (current) use of aspirin: Secondary | ICD-10-CM | POA: Diagnosis not present

## 2023-10-29 DIAGNOSIS — I051 Rheumatic mitral insufficiency: Secondary | ICD-10-CM | POA: Diagnosis not present

## 2023-10-29 DIAGNOSIS — Z9181 History of falling: Secondary | ICD-10-CM | POA: Diagnosis not present

## 2023-10-29 DIAGNOSIS — E039 Hypothyroidism, unspecified: Secondary | ICD-10-CM | POA: Diagnosis not present

## 2023-10-29 DIAGNOSIS — N1831 Chronic kidney disease, stage 3a: Secondary | ICD-10-CM | POA: Diagnosis not present

## 2023-10-29 DIAGNOSIS — Z7901 Long term (current) use of anticoagulants: Secondary | ICD-10-CM | POA: Diagnosis not present

## 2023-10-29 DIAGNOSIS — F419 Anxiety disorder, unspecified: Secondary | ICD-10-CM | POA: Diagnosis not present

## 2023-10-29 DIAGNOSIS — M19011 Primary osteoarthritis, right shoulder: Secondary | ICD-10-CM | POA: Diagnosis not present

## 2023-10-29 DIAGNOSIS — M419 Scoliosis, unspecified: Secondary | ICD-10-CM | POA: Diagnosis not present

## 2023-10-29 DIAGNOSIS — E559 Vitamin D deficiency, unspecified: Secondary | ICD-10-CM | POA: Diagnosis not present

## 2023-10-29 DIAGNOSIS — I2510 Atherosclerotic heart disease of native coronary artery without angina pectoris: Secondary | ICD-10-CM | POA: Diagnosis not present

## 2023-10-29 DIAGNOSIS — I700 Atherosclerosis of aorta: Secondary | ICD-10-CM | POA: Diagnosis not present

## 2023-10-29 DIAGNOSIS — I471 Supraventricular tachycardia, unspecified: Secondary | ICD-10-CM | POA: Diagnosis not present

## 2023-10-29 DIAGNOSIS — I129 Hypertensive chronic kidney disease with stage 1 through stage 4 chronic kidney disease, or unspecified chronic kidney disease: Secondary | ICD-10-CM | POA: Diagnosis not present

## 2023-10-29 DIAGNOSIS — Z87891 Personal history of nicotine dependence: Secondary | ICD-10-CM | POA: Diagnosis not present

## 2023-10-29 DIAGNOSIS — D631 Anemia in chronic kidney disease: Secondary | ICD-10-CM | POA: Diagnosis not present

## 2023-10-29 DIAGNOSIS — I82621 Acute embolism and thrombosis of deep veins of right upper extremity: Secondary | ICD-10-CM | POA: Diagnosis not present

## 2023-10-29 DIAGNOSIS — I252 Old myocardial infarction: Secondary | ICD-10-CM | POA: Diagnosis not present

## 2023-10-29 DIAGNOSIS — Z556 Problems related to health literacy: Secondary | ICD-10-CM | POA: Diagnosis not present

## 2023-11-02 ENCOUNTER — Telehealth: Payer: Self-pay | Admitting: Primary Care

## 2023-11-02 NOTE — Telephone Encounter (Signed)
 Approved.

## 2023-11-02 NOTE — Telephone Encounter (Signed)
 Copied from CRM [id number]. Topic: Clinical - Home Health Verbal Orders >> Nov 02, 2023 12:06 PM Lorrine Rotunda wrote: Caller/Agency: [facility] Callback Number: [phone] Service Requested: Physical Therapy Frequency: 1 week 1, 2 weeks 4, 1 week 4 Any new concerns about the patient? No

## 2023-11-02 NOTE — Telephone Encounter (Signed)
 Called and left message on secured voice mail with orders. Requested call back if any questions.

## 2023-11-05 ENCOUNTER — Telehealth: Payer: Self-pay

## 2023-11-05 NOTE — Telephone Encounter (Signed)
 Copied from CRM [id number]. Topic: Clinical - Red Word Triage >> Nov 05, 2023 10:06 AM Orien Bird wrote: [facility] that prompted transfer to Nurse Triage: Patient broke her arm and she uses a walker and daughter is wanting to know what transportation is out there her mom can use

## 2023-11-05 NOTE — Telephone Encounter (Signed)
 I spoke with pts daughter [facility] signed)Pt has already had surgery ands been in rehab and now is for FU with Tresea Frost NP but daughter requesting Carloyn Chi assistance transportation for appt on 11/06/23 at 10:40. Sending note to Amy front desk supervisor who will speak with pts daughhter and Joellen CMA team lead.

## 2023-11-05 NOTE — Telephone Encounter (Signed)
 I don't see triage note on this am I looking wrong?

## 2023-11-06 ENCOUNTER — Other Ambulatory Visit: Payer: Self-pay

## 2023-11-06 ENCOUNTER — Encounter: Payer: Self-pay | Admitting: Primary Care

## 2023-11-06 ENCOUNTER — Telehealth (INDEPENDENT_AMBULATORY_CARE_PROVIDER_SITE_OTHER): Admitting: Primary Care

## 2023-11-06 VITALS — BP 112/60 | HR 56 | Temp 97.0°F | Ht 59.0 in | Wt 141.0 lb

## 2023-11-06 DIAGNOSIS — E785 Hyperlipidemia, unspecified: Secondary | ICD-10-CM

## 2023-11-06 DIAGNOSIS — N1831 Chronic kidney disease, stage 3a: Secondary | ICD-10-CM

## 2023-11-06 DIAGNOSIS — I10 Essential (primary) hypertension: Secondary | ICD-10-CM | POA: Diagnosis not present

## 2023-11-06 DIAGNOSIS — M19071 Primary osteoarthritis, right ankle and foot: Secondary | ICD-10-CM

## 2023-11-06 DIAGNOSIS — M19072 Primary osteoarthritis, left ankle and foot: Secondary | ICD-10-CM

## 2023-11-06 DIAGNOSIS — R531 Weakness: Secondary | ICD-10-CM | POA: Diagnosis not present

## 2023-11-06 DIAGNOSIS — R300 Dysuria: Secondary | ICD-10-CM

## 2023-11-06 DIAGNOSIS — M170 Bilateral primary osteoarthritis of knee: Secondary | ICD-10-CM

## 2023-11-06 DIAGNOSIS — F411 Generalized anxiety disorder: Secondary | ICD-10-CM

## 2023-11-06 DIAGNOSIS — S42341A Displaced spiral fracture of shaft of humerus, right arm, initial encounter for closed fracture: Secondary | ICD-10-CM

## 2023-11-06 DIAGNOSIS — K2100 Gastro-esophageal reflux disease with esophagitis, without bleeding: Secondary | ICD-10-CM | POA: Diagnosis not present

## 2023-11-06 DIAGNOSIS — I252 Old myocardial infarction: Secondary | ICD-10-CM | POA: Diagnosis not present

## 2023-11-06 DIAGNOSIS — M19042 Primary osteoarthritis, left hand: Secondary | ICD-10-CM

## 2023-11-06 DIAGNOSIS — M19041 Primary osteoarthritis, right hand: Secondary | ICD-10-CM

## 2023-11-06 DIAGNOSIS — E039 Hypothyroidism, unspecified: Secondary | ICD-10-CM | POA: Diagnosis not present

## 2023-11-06 MED ORDER — ESCITALOPRAM OXALATE 10 MG PO TABS: 10.0000 mg | ORAL_TABLET | Freq: Every day | ORAL | 0 refills | Status: AC

## 2023-11-06 MED ORDER — ENALAPRIL-HYDROCHLOROTHIAZIDE 10-25 MG PO TABS: 1.0000 | ORAL_TABLET | Freq: Every day | ORAL | 3 refills | Status: AC

## 2023-11-06 MED ORDER — METOPROLOL TARTRATE 25 MG PO TABS: 12.5000 mg | ORAL_TABLET | Freq: Two times a day (BID) | ORAL | 3 refills | Status: AC

## 2023-11-06 NOTE — Progress Notes (Signed)
 Patient ID: Kathleen Paul, female    DOB: 11/01/1935, 88 y.o.   MRN: [id number]  Virtual visit completed through [employer], a video enabled telemedicine application. Due to national recommendations of social distancing due to COVID-19, a virtual visit is felt to be most appropriate for this patient at this time. Reviewed limitations, risks, security and privacy concerns of performing a virtual visit and the availability of in person appointments. I also reviewed that there may be a patient responsible charge related to this service. The patient agreed to proceed.   Patient location: home Provider location: [place] at [facility], office Persons participating in this virtual visit: patient, provider, patient's daughter.   If any vitals were documented, they were collected by patient at home unless specified below.    BP 112/60 Comment: pt reported  Pulse (!) 56 Comment: pt reported  Temp (!) 97 F (36.1 C) Comment: pt reported  Ht 4\' 11"  (1.499 m)   Wt 141 lb (64 kg) Comment: pt reported  BMI 28.48 kg/m    CC: Hospital follow-up Subjective:   HPI: Kathleen Paul is a 88 y.o. female with a history of hypertension, CAD, pneumonia, NSTEMI, right humeral fracture, osteoarthritis, hypothyroidism, CKD, anemia who presents today for hospital follow-up.  She has no transportation to the office today.  Admitted to [facility] hospital from 09/01/2023 to 09/19/2023.  She originally presented to [facility] for right upper extremity pain after a mechanical fall.  X-ray revealed right humeral fracture, labs revealed elevated troponin levels.  Orthopedics consulted who recommended conservative treatment, no surgery.  Cardiology consulted, originally initiated on heparin  drip which was discontinued due to acute blood loss anemia from right humeral fracture.  She required 3 units of packed red blood cells.  She was also noted to have a left lower lobe pneumonia, treated with Rocephin  and azithromycin .  She was  initiated on metoprolol  tartrate 12.5 mg twice daily for paroxysmal SVT and enalapril -hydrochlorothiazide  10-25.  She was discharged to SNF on 09/19/2023.  She was discharged home from SNF on 10/23/23.  Since then she has followed with cardiology in the outpatient setting on 10/08/2023.  No changes were made to her regimen.  She was not granted surgical clearance for her right humeral fracture due to recent DVT to the right upper extremity.  She was advised to continue Eliquis 5 mg twice daily for total 3 months.  She has an appointment scheduled for Friday this week. She is checking BP at home which is running 110-120/60-80. She is compliant to her metoprolol  and enalapril -hydrochlorothiazide . She believes she has a UTI due to symptoms of "drawing" sensation to the bladder and dysuria. She denies hematuria, fevers. She has been visited by home health PT and nursing. She has been taking Klonopin  0.5 mg BID for her anxiety since her hospital stay, is wanting refills. She's noticed increased anxiety since her fall in February 2025.       Relevant past medical, surgical, family and social history reviewed and updated as indicated. Interim medical history since our last visit reviewed. Allergies and medications reviewed and updated. Outpatient Medications Prior to Visit  Medication Sig Dispense Refill   acetaminophen  (TYLENOL ) 325 MG tablet Take 650 mg by mouth every 6 (six) hours as needed.     alum & mag hydroxide-simeth (MAALOX PLUS) 400-400-40 MG/5ML suspension Take 5 mLs by mouth every 4 (four) hours as needed for indigestion.     benzonatate  (TESSALON ) 200 MG capsule Take 1 capsule (200 mg total)  by mouth 3 (three) times daily. 20 capsule 0   Calcium -Magnesium -Vitamin D (CALCIUM  1200+D3 PO) Take 1 tablet by mouth daily.     CLARITIN 10 MG tablet Take 10 mg by mouth at bedtime.     ELIQUIS 5 MG TABS tablet Take 5 mg by mouth 2 (two) times daily.     HYDROcodone -acetaminophen  (NORCO/VICODIN) 5-325 MG  tablet Take 1 tablet by mouth 3 (three) times daily as needed.     KLONOPIN  0.5 MG tablet Take 0.25 mg by mouth 2 (two) times daily.     levothyroxine  (SYNTHROID ) 88 MCG tablet Take 1 tablet by mouth every morning on an empty stomach with water  only.  No food or other medications for 30 minutes. 90 tablet 2   Multiple Vitamin (MULTIVITAMIN PO) Take 1 tablet by mouth daily.     nitroGLYCERIN  (NITROSTAT ) 0.4 MG SL tablet Place 1 tablet (0.4 mg total) under the tongue every 5 (five) minutes x 3 doses as needed for up to 10 days for chest pain. 30 tablet 0   omeprazole  (PRILOSEC) 40 MG capsule Take 1 capsule (40 mg total) by mouth daily. for heartburn. 90 capsule 3   polyethylene glycol (MIRALAX  / GLYCOLAX ) 17 g packet Take 17 g by mouth daily as needed for mild constipation.     promethazine  (PHENERGAN ) 12.5 MG tablet Take 1 tablet by mouth every 6 (six) hours as needed.     simvastatin  (ZOCOR ) 20 MG tablet Take 1 tablet (20 mg total) by mouth every evening. for cholesterol. 90 tablet 2   enalapril  (VASOTEC ) 20 MG tablet Take 1 tablet (20 mg total) by mouth daily. for blood pressure. 90 tablet 3   enalapril -hydrochlorothiazide  (VASERETIC ) 10-25 MG tablet 1 [tbl] by oral route.     hydrochlorothiazide  (HYDRODIURIL ) 12.5 MG tablet Take 1 tablet (12.5 mg total) by mouth daily. 30 tablet 0   metoprolol  tartrate (LOPRESSOR ) 25 MG tablet Take 0.5 tablets (12.5 mg total) by mouth 2 (two) times daily. 30 tablet 0   aspirin  EC 81 MG tablet Take 1 tablet (81 mg total) by mouth daily. Swallow whole. Aspirin  & plavix  for 21 days, f/by aspirin  alone (Patient not taking: Reported on 11/06/2023) 30 tablet 0   bisacodyl  (DULCOLAX) 10 MG suppository Place 10 mg rectally as needed for moderate constipation. (Patient not taking: Reported on 11/06/2023)     Dextromethorphan -guaiFENesin  20-200 MG/20ML LIQD Take 22.5 mLs by mouth every 4 (four) hours as needed. (Patient not taking: Reported on 11/06/2023) 118 mL 0   No  facility-administered medications prior to visit.     Per HPI unless specifically indicated in ROS section below Review of Systems  Cardiovascular:  Negative for chest pain.  Genitourinary:  Positive for dysuria.  Musculoskeletal:  Positive for arthralgias.  Neurological:  Positive for weakness.  Psychiatric/Behavioral:  The patient is nervous/anxious.    Objective:  BP 112/60 Comment: pt reported  Pulse (!) 56 Comment: pt reported  Temp (!) 97 F (36.1 C) Comment: pt reported  Ht 4\' 11"  (1.499 m)   Wt 141 lb (64 kg) Comment: pt reported  BMI 28.48 kg/m   Wt Readings from Last 3 Encounters:  11/06/23 141 lb (64 kg)  10/08/23 145 lb (65.8 kg)  09/01/23 138 lb (62.6 kg)       Physical exam: General: Alert and oriented x 3, no distress, does not appear sickly  Pulmonary: Speaks in complete sentences without increased work of breathing, no cough during visit.  Psychiatric: Normal mood, thought content,  and behavior.     Results for orders placed or performed in visit on 10/08/23  CBC   Collection Time: 10/08/23 10:36 AM  Result Value Ref Range   WBC 7.0 3.4 - 10.8 x10E3/uL   RBC 3.34 (L) 3.77 - 5.28 x10E6/uL   Hemoglobin 10.4 (L) 11.1 - 15.9 g/dL   Hematocrit 16.1 (L) 09.6 - 46.6 %   MCV 96 79 - 97 fL   MCH 31.1 26.6 - 33.0 pg   MCHC 32.5 31.5 - 35.7 g/dL   RDW 04.5 (H) 40.9 - 81.1 %   Platelets 232 150 - 450 x10E3/uL   Assessment & Plan:   Problem List Items Addressed This Visit       Cardiovascular and Mediastinum   Essential hypertension   Controlled with home readings.  Continue enalapril -hydrochlorothiazide  10-25 mg daily, metoprolol  tartrate 12.5 mg twice daily.       Relevant Medications   ELIQUIS 5 MG TABS tablet   enalapril -hydrochlorothiazide  (VASERETIC ) 10-25 MG tablet   metoprolol  tartrate (LOPRESSOR ) 25 MG tablet   RESOLVED: Hypertension   Relevant Medications   ELIQUIS 5 MG TABS tablet   enalapril -hydrochlorothiazide  (VASERETIC ) 10-25 MG  tablet   metoprolol  tartrate (LOPRESSOR ) 25 MG tablet     Digestive   Gastroesophageal reflux disease   No concerns today.  Continue omeprazole  40 mg daily.        Endocrine   Hypothyroidism   Repeat TSH pending.  Continue levothyroxine  88 mcg daily.      Relevant Medications   metoprolol  tartrate (LOPRESSOR ) 25 MG tablet   Other Relevant Orders   TSH     Musculoskeletal and Integument   Primary osteoarthritis of both hands   Resume tramadol  50 mg once to twice daily once completed with Norco prescription.      Relevant Medications   HYDROcodone -acetaminophen  (NORCO/VICODIN) 5-325 MG tablet   Primary osteoarthritis of both knees   Resume tramadol  50 mg once to twice daily once completed with Norco prescription.      Relevant Medications   HYDROcodone -acetaminophen  (NORCO/VICODIN) 5-325 MG tablet   Primary osteoarthritis of both feet   Resume tramadol  50 mg once to twice daily once completed with Norco prescription.      Relevant Medications   HYDROcodone -acetaminophen  (NORCO/VICODIN) 5-325 MG tablet   Closed right humeral fracture   With recent hospitalization. Hospital notes, labs, imaging reviewed.  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 Apixaban 5 mg twice daily x 3 months.  Follow-up with orthopedics as scheduled.        Genitourinary   Chronic kidney disease, stage 3a (HCC)   Improved. Review labs from March 2025 from hospital stay.        Other   Dyslipidemia   Repeat lipid panel pending. Continue simvastatin  20 mg daily.      Relevant Orders   Lipid panel   Dysuria   Urinalysis pending collection for point-of-care testing and culture.      Relevant Orders   POCT Urinalysis Dipstick (Automated)   Urine Culture   Generalized weakness   Since recent hospitalization. Hospital labs, notes, imaging reviewed.  Continue with home health physical therapy.      GAD  (generalized anxiety disorder) - Primary   Uncontrolled since hospitalization.  Discussed that daily use of clonazepam  is not best practice or safe. Recommended she discontinue clonazepam .  Start Lexapro 10 mg daily.  Patient is to take 1/2 tablet  daily for 8 days, then advance to 1 full tablet thereafter.  Patient verbalized understanding.   She will update.       Relevant Medications   escitalopram (LEXAPRO) 10 MG tablet   History of non-ST elevation myocardial infarction (NSTEMI)   With recent hospitalization. Hospital notes, labs, imaging reviewed.  Reviewed cardiology notes from March 2025. Continue metoprolol  tartrate 12.5 mg twice daily, lipid control, blood pressure control.        Meds ordered this encounter  Medications   enalapril -hydrochlorothiazide  (VASERETIC ) 10-25 MG tablet    Sig: Take 1 tablet by mouth daily. for blood pressure.    Dispense:  90 tablet    Refill:  3    Supervising Provider:   Cherlyn Cornet, AMY E [[id number]]   metoprolol  tartrate (LOPRESSOR ) 25 MG tablet    Sig: Take 0.5 tablets (12.5 mg total) by mouth 2 (two) times daily. for blood pressure.    Dispense:  135 tablet    Refill:  3    Supervising Provider:   Cherlyn Cornet, AMY E [[id number]]   escitalopram (LEXAPRO) 10 MG tablet    Sig: Take 1 tablet (10 mg total) by mouth daily. For anxiety    Dispense:  90 tablet    Refill:  0    Supervising Provider:   Cherlyn Cornet, AMY E [[id number]]   Orders Placed This Encounter  Procedures   Urine Culture    Standing Status:   Future    Expiration Date:   11/05/2024   TSH    Standing Status:   Future    Expiration Date:   11/05/2024   Lipid panel    Standing Status:   Future    Expiration Date:   11/05/2024   POCT Urinalysis Dipstick (Automated)    Standing Status:   Future    Expected Date:   11/06/2023    Expiration Date:   12/06/2023    I discussed the assessment and treatment plan with the patient. The patient was provided an opportunity to ask questions and all were  answered. The patient agreed with the plan and demonstrated an understanding of the instructions. The patient was advised to call back or seek an in-person evaluation if the symptoms worsen or if the condition fails to improve as anticipated.  Follow up plan:  Start Lexapro 10 mg daily for anxiety and depression. Take 1/2 tablet by mouth once daily for about one week, then increase to 1 full tablet thereafter.   Avoid taking clonazepam  for anxiety. This is not safe for daily use.  Schedule a lab appointment for later this week.   Please pick up the urine test.   It was a pleasure to see you today!    Cinthia Rodden K Arlys Scatena, NP

## 2023-11-06 NOTE — Assessment & Plan Note (Signed)
 Resume tramadol  50 mg once to twice daily once completed with Norco prescription.

## 2023-11-06 NOTE — Assessment & Plan Note (Signed)
Repeat lipid panel pending. Continue simvastatin 20 mg daily.  

## 2023-11-06 NOTE — Assessment & Plan Note (Signed)
 Repeat TSH pending.  Continue levothyroxine  88 mcg daily.

## 2023-11-06 NOTE — Telephone Encounter (Signed)
 Spoke to the patient this morning, going to do a video visit instead of in person  Informed the patient that it may be later than 10:40am when Polly Brink calls  Patient was very appreciative.  Also, here are some additional resources that she may be able to look into:  [employer]    Age 86 and older [phone] [place]        [phone] [facility] [place] [facility] [phone] for non-medical rides,  Age 8 or older

## 2023-11-06 NOTE — Assessment & Plan Note (Signed)
 With recent hospitalization. Hospital notes, labs, imaging reviewed.  Reviewed cardiology notes from March 2025. Continue metoprolol  tartrate 12.5 mg twice daily, lipid control, blood pressure control.

## 2023-11-06 NOTE — Telephone Encounter (Signed)
 Please see my response on patient message from [phone] and I also sent the patient a my chart message today

## 2023-11-06 NOTE — Patient Instructions (Addendum)
 Start Lexapro 10 mg daily for anxiety and depression. Take 1/2 tablet by mouth once daily for about one week, then increase to 1 full tablet thereafter.   Avoid taking clonazepam  for anxiety. This is not safe for daily use.  Schedule a lab appointment for later this week.   Please pick up the urine test.   It was a pleasure to see you today!

## 2023-11-06 NOTE — Assessment & Plan Note (Signed)
 With recent hospitalization. Hospital notes, labs, imaging reviewed.  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 Apixaban 5 mg twice daily x 3 months.  Follow-up with orthopedics as scheduled.

## 2023-11-06 NOTE — Assessment & Plan Note (Signed)
 Improved. Review labs from March 2025 from hospital stay.

## 2023-11-06 NOTE — Assessment & Plan Note (Signed)
 Controlled with home readings.  Continue enalapril -hydrochlorothiazide  10-25 mg daily, metoprolol  tartrate 12.5 mg twice daily.

## 2023-11-06 NOTE — Assessment & Plan Note (Signed)
 Since recent hospitalization. Hospital labs, notes, imaging reviewed.  Continue with home health physical therapy.

## 2023-11-06 NOTE — Assessment & Plan Note (Addendum)
 Uncontrolled since hospitalization.  Discussed that daily use of clonazepam  is not best practice or safe. Recommended she discontinue clonazepam .  Start Lexapro 10 mg daily.  Patient is to take 1/2 tablet daily for 8 days, then advance to 1 full tablet thereafter.  Patient verbalized understanding.   She will update.

## 2023-11-06 NOTE — Assessment & Plan Note (Signed)
 Urinalysis pending collection for point-of-care testing and culture.

## 2023-11-06 NOTE — Assessment & Plan Note (Signed)
 No concerns today.  Continue omeprazole  40 mg daily.

## 2023-11-07 ENCOUNTER — Telehealth: Payer: Self-pay | Admitting: Primary Care

## 2023-11-07 DIAGNOSIS — S42301D Unspecified fracture of shaft of humerus, right arm, subsequent encounter for fracture with routine healing: Secondary | ICD-10-CM | POA: Diagnosis not present

## 2023-11-07 LAB — URINE CULTURE
MICRO NUMBER:: 16389269
SPECIMEN QUALITY:: ADEQUATE

## 2023-11-07 NOTE — Telephone Encounter (Signed)
 Spoke to daughter on dpr. Labs will be done by home health no appointment needed at this time.  No further action needed at this time.

## 2023-11-07 NOTE — Telephone Encounter (Signed)
 Copied from CRM [id number]. Topic: Clinical - Request for Lab/Test Order >> Nov 06, 2023  4:48 PM DeAngela L wrote: Reason for CRM: Patient daughter calling to schedule lab appointment at the same time the patient has transportation and another anda great time would 1 pm  Daughter call back number [phone] (M)

## 2023-11-07 NOTE — Telephone Encounter (Signed)
 Do we know how to initiate wheelchair transport from [facility] [employer]?

## 2023-11-07 NOTE — Telephone Encounter (Signed)
 Copied from CRM [id number]. Topic: Clinical - Request for Lab/Test Order >> Nov 07, 2023 10:16 AM Danae Duncans wrote: Reason for CRM: Ethyl Hering from [facility] request to perform pt labs as the pt is homebound,   Request to be fax: [phone] attention to: Alan Hoyer number: [phone]

## 2023-11-07 NOTE — Telephone Encounter (Signed)
 Perfect!  I need a urinalysis with urine culture, TSH, lipid panel. Can we fax this over?

## 2023-11-07 NOTE — Telephone Encounter (Signed)
 Order faxed to number provided.  No further action needed at this time.

## 2023-11-08 ENCOUNTER — Other Ambulatory Visit (HOSPITAL_COMMUNITY): Payer: Self-pay

## 2023-11-08 DIAGNOSIS — I2510 Atherosclerotic heart disease of native coronary artery without angina pectoris: Secondary | ICD-10-CM | POA: Diagnosis not present

## 2023-11-08 DIAGNOSIS — F419 Anxiety disorder, unspecified: Secondary | ICD-10-CM | POA: Diagnosis not present

## 2023-11-08 DIAGNOSIS — I471 Supraventricular tachycardia, unspecified: Secondary | ICD-10-CM | POA: Diagnosis not present

## 2023-11-08 DIAGNOSIS — D631 Anemia in chronic kidney disease: Secondary | ICD-10-CM | POA: Diagnosis not present

## 2023-11-08 DIAGNOSIS — S42301D Unspecified fracture of shaft of humerus, right arm, subsequent encounter for fracture with routine healing: Secondary | ICD-10-CM | POA: Diagnosis not present

## 2023-11-08 DIAGNOSIS — I129 Hypertensive chronic kidney disease with stage 1 through stage 4 chronic kidney disease, or unspecified chronic kidney disease: Secondary | ICD-10-CM | POA: Diagnosis not present

## 2023-11-08 DIAGNOSIS — E039 Hypothyroidism, unspecified: Secondary | ICD-10-CM | POA: Diagnosis not present

## 2023-11-08 DIAGNOSIS — I82621 Acute embolism and thrombosis of deep veins of right upper extremity: Secondary | ICD-10-CM | POA: Diagnosis not present

## 2023-11-08 DIAGNOSIS — I252 Old myocardial infarction: Secondary | ICD-10-CM | POA: Diagnosis not present

## 2023-11-08 DIAGNOSIS — N1831 Chronic kidney disease, stage 3a: Secondary | ICD-10-CM | POA: Diagnosis not present

## 2023-11-08 DIAGNOSIS — E785 Hyperlipidemia, unspecified: Secondary | ICD-10-CM | POA: Diagnosis not present

## 2023-11-08 DIAGNOSIS — R32 Unspecified urinary incontinence: Secondary | ICD-10-CM | POA: Diagnosis not present

## 2023-11-09 ENCOUNTER — Telehealth: Payer: Self-pay

## 2023-11-09 DIAGNOSIS — S42301D Unspecified fracture of shaft of humerus, right arm, subsequent encounter for fracture with routine healing: Secondary | ICD-10-CM | POA: Diagnosis not present

## 2023-11-09 DIAGNOSIS — M25511 Pain in right shoulder: Secondary | ICD-10-CM | POA: Diagnosis not present

## 2023-11-09 NOTE — Telephone Encounter (Signed)
 Unable to reach [place]. Left voicemail to return call to our office.

## 2023-11-09 NOTE — Telephone Encounter (Signed)
 Copied from CRM [id number]. Topic: Clinical - Home Health Verbal Orders >> Nov 09, 2023 11:09 AM Adonis Hoot wrote: Caller/Agency: [name]/[facility] Callback Number: [phone] Service Requested: Occupational Therapy Frequency: 1wk 1 2wk 7 Any new concerns about the patient? No

## 2023-11-09 NOTE — Telephone Encounter (Signed)
 Approved.

## 2023-11-10 DIAGNOSIS — S42301D Unspecified fracture of shaft of humerus, right arm, subsequent encounter for fracture with routine healing: Secondary | ICD-10-CM | POA: Diagnosis not present

## 2023-11-12 ENCOUNTER — Encounter: Payer: Self-pay | Admitting: Internal Medicine

## 2023-11-12 DIAGNOSIS — S42301D Unspecified fracture of shaft of humerus, right arm, subsequent encounter for fracture with routine healing: Secondary | ICD-10-CM | POA: Diagnosis not present

## 2023-11-12 NOTE — Telephone Encounter (Signed)
 Called and advised Judie Noun with [facility]  of the approval of the requested verbal orders for this patient. Advised to call back with any further questions.

## 2023-11-13 ENCOUNTER — Telehealth: Payer: Self-pay | Admitting: Primary Care

## 2023-11-13 DIAGNOSIS — S42301D Unspecified fracture of shaft of humerus, right arm, subsequent encounter for fracture with routine healing: Secondary | ICD-10-CM | POA: Diagnosis not present

## 2023-11-13 NOTE — Telephone Encounter (Signed)
 Have her stop the Lexapro . Clonazepam  is not safe for daily use.  We can try a medication called buspirone which helps with anxiety. This is taken twice daily. This is different than Lexapro . Let me know if she is interested.

## 2023-11-13 NOTE — Telephone Encounter (Signed)
 Spoke with pt's daughter, Adriana Hopping about her most recent lab work. While speaking to [facility], she stated that pt is not doing well with Lexapro . Reports having headache, nausea. They even tried to cut the medication in half and she still continued to have issues. Adriana Hopping states that the only thing that helps the pt's anxiety is clonazepam . They would like to have this prescribed.

## 2023-11-14 DIAGNOSIS — D649 Anemia, unspecified: Secondary | ICD-10-CM

## 2023-11-14 DIAGNOSIS — E785 Hyperlipidemia, unspecified: Secondary | ICD-10-CM

## 2023-11-14 DIAGNOSIS — E039 Hypothyroidism, unspecified: Secondary | ICD-10-CM

## 2023-11-14 DIAGNOSIS — K2100 Gastro-esophageal reflux disease with esophagitis, without bleeding: Secondary | ICD-10-CM

## 2023-11-14 DIAGNOSIS — S42301D Unspecified fracture of shaft of humerus, right arm, subsequent encounter for fracture with routine healing: Secondary | ICD-10-CM | POA: Diagnosis not present

## 2023-11-14 DIAGNOSIS — J302 Other seasonal allergic rhinitis: Secondary | ICD-10-CM

## 2023-11-14 DIAGNOSIS — F411 Generalized anxiety disorder: Secondary | ICD-10-CM

## 2023-11-14 DIAGNOSIS — I82491 Acute embolism and thrombosis of other specified deep vein of right lower extremity: Secondary | ICD-10-CM

## 2023-11-14 NOTE — Telephone Encounter (Signed)
Noted, see my chart message. 

## 2023-11-14 NOTE — Telephone Encounter (Signed)
 Called and spoke with patients daughter, she stated that in order to see the improvements with therapy they believe she needs to be on the clonazepam  daily for now. She stated this is the only thing that works for her. Patient was set back on PT/OT while she was so weak from the side effects she experienced from Lexapro  and patients daughter does not want anything further to interfere with PT/OT.  She is getting the clonazepam  prescribed through [facility] right now where she was at rehab. Patients daughter also sent a MyChart message this morning and she is okay with a response from Polly Brink that way.

## 2023-11-15 ENCOUNTER — Telehealth: Payer: Self-pay | Admitting: Primary Care

## 2023-11-15 NOTE — Telephone Encounter (Unsigned)
 Copied from CRM [id number]. Topic: General - Other >> Nov 15, 2023  3:00 PM Clyde Darling P wrote: Reason for CRM: Pt son called to request to speak to PCP or PCP nurse, richard would like to be contact at [phone]

## 2023-11-16 MED ORDER — BUSPIRONE HCL 5 MG PO TABS: 5.0000 mg | ORAL_TABLET | Freq: Two times a day (BID) | ORAL | 0 refills | Status: AC

## 2023-11-20 MED ORDER — OMEPRAZOLE 40 MG PO CPDR: 40.0000 mg | DELAYED_RELEASE_CAPSULE | Freq: Every day | ORAL | 3 refills | Status: AC

## 2023-11-20 MED ORDER — LEVOCETIRIZINE DIHYDROCHLORIDE 5 MG PO TABS: 5.0000 mg | ORAL_TABLET | Freq: Every evening | ORAL | 0 refills | Status: AC

## 2023-11-20 MED ORDER — SIMVASTATIN 20 MG PO TABS: 20.0000 mg | ORAL_TABLET | Freq: Every evening | ORAL | 3 refills | Status: AC

## 2023-11-20 MED ORDER — LEVOTHYROXINE SODIUM 88 MCG PO TABS: ORAL_TABLET | ORAL | 0 refills | Status: AC

## 2023-11-21 MED ORDER — ELIQUIS 5 MG PO TABS: 5.0000 mg | ORAL_TABLET | Freq: Two times a day (BID) | ORAL | 1 refills | Status: AC

## 2023-11-23 DIAGNOSIS — Z0000 Encounter for general adult medical examination without abnormal findings: Secondary | ICD-10-CM | POA: Diagnosis not present

## 2023-11-23 DIAGNOSIS — F411 Generalized anxiety disorder: Secondary | ICD-10-CM | POA: Diagnosis not present

## 2023-11-23 DIAGNOSIS — I130 Hypertensive heart and chronic kidney disease with heart failure and stage 1 through stage 4 chronic kidney disease, or unspecified chronic kidney disease: Secondary | ICD-10-CM | POA: Diagnosis not present

## 2023-11-23 DIAGNOSIS — I700 Atherosclerosis of aorta: Secondary | ICD-10-CM | POA: Diagnosis not present

## 2023-11-23 DIAGNOSIS — M84321S Stress fracture, right humerus, sequela: Secondary | ICD-10-CM | POA: Diagnosis not present

## 2023-11-23 DIAGNOSIS — W19XXXS Unspecified fall, sequela: Secondary | ICD-10-CM | POA: Diagnosis not present

## 2023-11-23 DIAGNOSIS — I5030 Unspecified diastolic (congestive) heart failure: Secondary | ICD-10-CM | POA: Diagnosis not present

## 2023-11-23 DIAGNOSIS — N182 Chronic kidney disease, stage 2 (mild): Secondary | ICD-10-CM | POA: Diagnosis not present

## 2023-11-23 DIAGNOSIS — Z66 Do not resuscitate: Secondary | ICD-10-CM | POA: Diagnosis not present

## 2023-11-23 DIAGNOSIS — R54 Age-related physical debility: Secondary | ICD-10-CM | POA: Diagnosis not present

## 2023-11-23 DIAGNOSIS — R296 Repeated falls: Secondary | ICD-10-CM | POA: Diagnosis not present

## 2023-11-27 ENCOUNTER — Other Ambulatory Visit: Payer: Self-pay | Admitting: Primary Care

## 2023-11-27 DIAGNOSIS — N390 Urinary tract infection, site not specified: Secondary | ICD-10-CM | POA: Diagnosis not present

## 2023-12-04 DIAGNOSIS — M25511 Pain in right shoulder: Secondary | ICD-10-CM | POA: Diagnosis not present

## 2023-12-17 ENCOUNTER — Other Ambulatory Visit: Payer: Self-pay | Admitting: Primary Care

## 2023-12-17 ENCOUNTER — Ambulatory Visit: Payer: Self-pay

## 2023-12-25 DIAGNOSIS — I700 Atherosclerosis of aorta: Secondary | ICD-10-CM | POA: Diagnosis not present

## 2023-12-25 DIAGNOSIS — I5032 Chronic diastolic (congestive) heart failure: Secondary | ICD-10-CM | POA: Diagnosis not present

## 2023-12-25 DIAGNOSIS — N182 Chronic kidney disease, stage 2 (mild): Secondary | ICD-10-CM | POA: Diagnosis not present

## 2023-12-25 DIAGNOSIS — Z7189 Other specified counseling: Secondary | ICD-10-CM | POA: Diagnosis not present

## 2023-12-25 DIAGNOSIS — I130 Hypertensive heart and chronic kidney disease with heart failure and stage 1 through stage 4 chronic kidney disease, or unspecified chronic kidney disease: Secondary | ICD-10-CM | POA: Diagnosis not present

## 2023-12-27 ENCOUNTER — Telehealth: Payer: Self-pay | Admitting: Nurse Practitioner

## 2023-12-27 NOTE — Telephone Encounter (Signed)
 Pt called in to cancel her appt next week for 3 mon f/u because she states her PCP took her off blood thinner and since that's what the appt was for, she doesn't need it anymore.  She asked when she should f/u again. Please advise.

## 2023-12-27 NOTE — Telephone Encounter (Signed)
 Hey I see the patient canceled her appt already. So we will let her follow up with us  when she is ready.

## 2024-01-01 NOTE — Telephone Encounter (Signed)
 Contacted the patient but no answer. Left message for patient to contact the office for an appointment if she feels it necessary. Stated on voicemail that provider wanted her to follow up in 3 months with app or Dr Okey.

## 2024-01-02 DIAGNOSIS — N1831 Chronic kidney disease, stage 3a: Secondary | ICD-10-CM | POA: Diagnosis not present

## 2024-01-02 DIAGNOSIS — I129 Hypertensive chronic kidney disease with stage 1 through stage 4 chronic kidney disease, or unspecified chronic kidney disease: Secondary | ICD-10-CM | POA: Diagnosis not present

## 2024-01-02 DIAGNOSIS — D631 Anemia in chronic kidney disease: Secondary | ICD-10-CM | POA: Diagnosis not present

## 2024-01-03 ENCOUNTER — Ambulatory Visit: Admitting: Nurse Practitioner

## 2024-01-10 ENCOUNTER — Other Ambulatory Visit: Payer: Self-pay | Admitting: Primary Care

## 2024-01-10 DIAGNOSIS — D649 Anemia, unspecified: Secondary | ICD-10-CM

## 2024-01-10 DIAGNOSIS — E039 Hypothyroidism, unspecified: Secondary | ICD-10-CM | POA: Diagnosis not present

## 2024-01-15 ENCOUNTER — Other Ambulatory Visit: Payer: Self-pay | Admitting: Primary Care

## 2024-01-15 DIAGNOSIS — I82491 Acute embolism and thrombosis of other specified deep vein of right lower extremity: Secondary | ICD-10-CM

## 2024-01-15 NOTE — Telephone Encounter (Signed)
 Please call patient: Received refill request from pharmacy for Eliquis  blood thinner medicine from her leg blood clot.   I have in my notes that she should have completed 3 months of this medication by now and does not need to continue. Is she aware of this?

## 2024-01-15 NOTE — Telephone Encounter (Signed)
 Unable to reach patient. Left voicemail to return call to our office.

## 2024-01-16 ENCOUNTER — Telehealth: Payer: Self-pay

## 2024-01-16 NOTE — Telephone Encounter (Signed)
 Noted. Thanks.

## 2024-01-16 NOTE — Telephone Encounter (Signed)
 Called and spoke with patients daughter, she states that her care has been taken over by provider that is coming to her house. She requests Mallie be removed as PCP. The new provider has taken over prescribing medication.  The lab orders that were faxed to [facility] were drawn, and Ronal states the results were sent to the new provider instead of Mallie.

## 2024-01-16 NOTE — Telephone Encounter (Signed)
 See other phone note. Patient is being followed by different provider.

## 2024-01-16 NOTE — Telephone Encounter (Signed)
 Copied from CRM [id number]. Topic: General - Other >> Jan 15, 2024  4:06 PM Suzen RAMAN wrote: Reason for CRM: [facility] contact inquire about lab results for patient and to gain clarification on recent lab work for patient as well as any requested follow up for patient. Advise caller that labs are currently not available in system which is why patient has not received a call to discuss labs. Please contact patient as she is confused on what she needs to do next CB# [phone]

## 2024-01-17 ENCOUNTER — Ambulatory Visit: Payer: Self-pay | Admitting: Primary Care

## 2024-01-24 DIAGNOSIS — N390 Urinary tract infection, site not specified: Secondary | ICD-10-CM | POA: Diagnosis not present

## 2024-01-24 DIAGNOSIS — Z87440 Personal history of urinary (tract) infections: Secondary | ICD-10-CM | POA: Diagnosis not present

## 2024-01-25 ENCOUNTER — Encounter: Payer: Self-pay | Admitting: Advanced Practice Midwife

## 2024-01-25 DIAGNOSIS — Z79899 Other long term (current) drug therapy: Secondary | ICD-10-CM | POA: Diagnosis not present

## 2024-01-25 DIAGNOSIS — E039 Hypothyroidism, unspecified: Secondary | ICD-10-CM | POA: Diagnosis not present

## 2024-01-25 DIAGNOSIS — F411 Generalized anxiety disorder: Secondary | ICD-10-CM | POA: Diagnosis not present

## 2024-01-25 DIAGNOSIS — K219 Gastro-esophageal reflux disease without esophagitis: Secondary | ICD-10-CM | POA: Diagnosis not present

## 2024-01-25 DIAGNOSIS — Z79891 Long term (current) use of opiate analgesic: Secondary | ICD-10-CM | POA: Diagnosis not present

## 2024-01-25 DIAGNOSIS — M159 Polyosteoarthritis, unspecified: Secondary | ICD-10-CM | POA: Diagnosis not present

## 2024-01-25 DIAGNOSIS — J302 Other seasonal allergic rhinitis: Secondary | ICD-10-CM | POA: Diagnosis not present

## 2024-02-05 DIAGNOSIS — M159 Polyosteoarthritis, unspecified: Secondary | ICD-10-CM | POA: Diagnosis not present

## 2024-02-05 DIAGNOSIS — I519 Heart disease, unspecified: Secondary | ICD-10-CM | POA: Diagnosis not present

## 2024-02-05 DIAGNOSIS — I2510 Atherosclerotic heart disease of native coronary artery without angina pectoris: Secondary | ICD-10-CM | POA: Diagnosis not present

## 2024-02-05 DIAGNOSIS — E039 Hypothyroidism, unspecified: Secondary | ICD-10-CM | POA: Diagnosis not present

## 2024-02-07 NOTE — Telephone Encounter (Signed)
 The patient has to initiate transportation

## 2024-02-16 DIAGNOSIS — E039 Hypothyroidism, unspecified: Secondary | ICD-10-CM | POA: Diagnosis not present

## 2024-02-25 DIAGNOSIS — F411 Generalized anxiety disorder: Secondary | ICD-10-CM | POA: Diagnosis not present

## 2024-02-25 DIAGNOSIS — E559 Vitamin D deficiency, unspecified: Secondary | ICD-10-CM | POA: Diagnosis not present

## 2024-02-25 DIAGNOSIS — R296 Repeated falls: Secondary | ICD-10-CM | POA: Diagnosis not present

## 2024-02-25 DIAGNOSIS — E039 Hypothyroidism, unspecified: Secondary | ICD-10-CM | POA: Diagnosis not present

## 2024-02-25 DIAGNOSIS — I10 Essential (primary) hypertension: Secondary | ICD-10-CM | POA: Diagnosis not present

## 2024-03-09 DIAGNOSIS — E039 Hypothyroidism, unspecified: Secondary | ICD-10-CM | POA: Diagnosis not present

## 2024-03-09 DIAGNOSIS — N182 Chronic kidney disease, stage 2 (mild): Secondary | ICD-10-CM | POA: Diagnosis not present

## 2024-03-09 DIAGNOSIS — I5032 Chronic diastolic (congestive) heart failure: Secondary | ICD-10-CM | POA: Diagnosis not present

## 2024-03-09 DIAGNOSIS — I130 Hypertensive heart and chronic kidney disease with heart failure and stage 1 through stage 4 chronic kidney disease, or unspecified chronic kidney disease: Secondary | ICD-10-CM | POA: Diagnosis not present

## 2024-03-28 DIAGNOSIS — E039 Hypothyroidism, unspecified: Secondary | ICD-10-CM | POA: Diagnosis not present

## 2024-03-28 DIAGNOSIS — M159 Polyosteoarthritis, unspecified: Secondary | ICD-10-CM | POA: Diagnosis not present

## 2024-03-28 DIAGNOSIS — F1120 Opioid dependence, uncomplicated: Secondary | ICD-10-CM | POA: Diagnosis not present

## 2024-03-28 DIAGNOSIS — R54 Age-related physical debility: Secondary | ICD-10-CM | POA: Diagnosis not present

## 2024-03-28 DIAGNOSIS — Z1389 Encounter for screening for other disorder: Secondary | ICD-10-CM | POA: Diagnosis not present

## 2024-03-28 DIAGNOSIS — M810 Age-related osteoporosis without current pathological fracture: Secondary | ICD-10-CM | POA: Diagnosis not present

## 2024-03-28 DIAGNOSIS — D649 Anemia, unspecified: Secondary | ICD-10-CM | POA: Diagnosis not present

## 2024-04-02 DIAGNOSIS — R296 Repeated falls: Secondary | ICD-10-CM | POA: Diagnosis not present

## 2024-04-02 DIAGNOSIS — F411 Generalized anxiety disorder: Secondary | ICD-10-CM | POA: Diagnosis not present

## 2024-04-14 DIAGNOSIS — F411 Generalized anxiety disorder: Secondary | ICD-10-CM | POA: Diagnosis not present

## 2024-04-14 DIAGNOSIS — E663 Overweight: Secondary | ICD-10-CM | POA: Diagnosis not present

## 2024-04-14 DIAGNOSIS — Z86718 Personal history of other venous thrombosis and embolism: Secondary | ICD-10-CM | POA: Diagnosis not present

## 2024-04-14 DIAGNOSIS — M159 Polyosteoarthritis, unspecified: Secondary | ICD-10-CM | POA: Diagnosis not present

## 2024-04-30 DIAGNOSIS — N390 Urinary tract infection, site not specified: Secondary | ICD-10-CM | POA: Diagnosis not present

## 2024-04-30 DIAGNOSIS — E039 Hypothyroidism, unspecified: Secondary | ICD-10-CM | POA: Diagnosis not present

## 2024-04-30 DIAGNOSIS — Z79890 Hormone replacement therapy (postmenopausal): Secondary | ICD-10-CM | POA: Diagnosis not present

## 2024-05-05 DIAGNOSIS — M159 Polyosteoarthritis, unspecified: Secondary | ICD-10-CM | POA: Diagnosis not present

## 2024-05-05 DIAGNOSIS — I519 Heart disease, unspecified: Secondary | ICD-10-CM | POA: Diagnosis not present

## 2024-05-05 DIAGNOSIS — K449 Diaphragmatic hernia without obstruction or gangrene: Secondary | ICD-10-CM | POA: Diagnosis not present

## 2024-05-05 DIAGNOSIS — I252 Old myocardial infarction: Secondary | ICD-10-CM | POA: Diagnosis not present

## 2024-05-08 DIAGNOSIS — Z7689 Persons encountering health services in other specified circumstances: Secondary | ICD-10-CM | POA: Diagnosis not present

## 2024-05-08 DIAGNOSIS — D509 Iron deficiency anemia, unspecified: Secondary | ICD-10-CM | POA: Diagnosis not present

## 2024-05-08 DIAGNOSIS — E039 Hypothyroidism, unspecified: Secondary | ICD-10-CM | POA: Diagnosis not present

## 2024-05-09 DIAGNOSIS — I10 Essential (primary) hypertension: Secondary | ICD-10-CM | POA: Diagnosis not present

## 2024-05-09 DIAGNOSIS — F411 Generalized anxiety disorder: Secondary | ICD-10-CM | POA: Diagnosis not present

## 2024-05-28 DIAGNOSIS — Z713 Dietary counseling and surveillance: Secondary | ICD-10-CM | POA: Diagnosis not present

## 2024-05-28 DIAGNOSIS — F411 Generalized anxiety disorder: Secondary | ICD-10-CM | POA: Diagnosis not present

## 2024-05-28 DIAGNOSIS — S42201D Unspecified fracture of upper end of right humerus, subsequent encounter for fracture with routine healing: Secondary | ICD-10-CM | POA: Diagnosis not present

## 2024-05-28 DIAGNOSIS — M8000XD Age-related osteoporosis with current pathological fracture, unspecified site, subsequent encounter for fracture with routine healing: Secondary | ICD-10-CM | POA: Diagnosis not present

## 2024-05-28 DIAGNOSIS — F1120 Opioid dependence, uncomplicated: Secondary | ICD-10-CM | POA: Diagnosis not present

## 2024-05-28 DIAGNOSIS — J029 Acute pharyngitis, unspecified: Secondary | ICD-10-CM | POA: Diagnosis not present

## 2024-05-28 DIAGNOSIS — Z6828 Body mass index (BMI) 28.0-28.9, adult: Secondary | ICD-10-CM | POA: Diagnosis not present

## 2024-05-28 DIAGNOSIS — Z7182 Exercise counseling: Secondary | ICD-10-CM | POA: Diagnosis not present

## 2024-05-28 DIAGNOSIS — Z87891 Personal history of nicotine dependence: Secondary | ICD-10-CM | POA: Diagnosis not present

## 2024-06-06 DIAGNOSIS — N182 Chronic kidney disease, stage 2 (mild): Secondary | ICD-10-CM | POA: Diagnosis not present

## 2024-06-06 DIAGNOSIS — F411 Generalized anxiety disorder: Secondary | ICD-10-CM | POA: Diagnosis not present

## 2024-06-06 DIAGNOSIS — E559 Vitamin D deficiency, unspecified: Secondary | ICD-10-CM | POA: Diagnosis not present

## 2024-06-06 DIAGNOSIS — M8000XD Age-related osteoporosis with current pathological fracture, unspecified site, subsequent encounter for fracture with routine healing: Secondary | ICD-10-CM | POA: Diagnosis not present

## 2024-06-06 DIAGNOSIS — I5032 Chronic diastolic (congestive) heart failure: Secondary | ICD-10-CM | POA: Diagnosis not present

## 2024-06-06 DIAGNOSIS — I130 Hypertensive heart and chronic kidney disease with heart failure and stage 1 through stage 4 chronic kidney disease, or unspecified chronic kidney disease: Secondary | ICD-10-CM | POA: Diagnosis not present
